# Patient Record
Sex: Female | Born: 1965 | Race: White | Hispanic: No | Marital: Married | Smoking: Never smoker
Health system: Southern US, Community
[De-identification: ages and names within clinical notes are randomized; demographics above are authoritative.]

## PROBLEM LIST (undated history)

## (undated) DIAGNOSIS — C519 Malignant neoplasm of vulva, unspecified: Secondary | ICD-10-CM

## (undated) DIAGNOSIS — Z8739 Personal history of other diseases of the musculoskeletal system and connective tissue: Secondary | ICD-10-CM

## (undated) DIAGNOSIS — Z940 Kidney transplant status: Secondary | ICD-10-CM

## (undated) DIAGNOSIS — T8859XA Other complications of anesthesia, initial encounter: Secondary | ICD-10-CM

## (undated) DIAGNOSIS — I82409 Acute embolism and thrombosis of unspecified deep veins of unspecified lower extremity: Secondary | ICD-10-CM

## (undated) DIAGNOSIS — T8612 Kidney transplant failure: Secondary | ICD-10-CM

## (undated) DIAGNOSIS — Z992 Dependence on renal dialysis: Secondary | ICD-10-CM

## (undated) HISTORY — PX: TOTAL ABDOMINAL HYSTERECTOMY: SHX209

## (undated) HISTORY — DX: Dependence on renal dialysis: Z99.2

## (undated) HISTORY — DX: Personal history of other diseases of the musculoskeletal system and connective tissue: Z87.39

## (undated) HISTORY — DX: Acute embolism and thrombosis of unspecified deep veins of unspecified lower extremity: I82.409

## (undated) HISTORY — PX: LEEP: SHX91

## (undated) HISTORY — DX: Malignant neoplasm of vulva, unspecified: C51.9

---

## 1999-04-04 HISTORY — PX: KIDNEY TRANSPLANT: SHX239

## 2012-04-03 HISTORY — PX: KIDNEY TRANSPLANT: SHX239

## 2012-11-22 DIAGNOSIS — D071 Carcinoma in situ of vulva: Secondary | ICD-10-CM | POA: Insufficient documentation

## 2012-11-22 DIAGNOSIS — Z940 Kidney transplant status: Secondary | ICD-10-CM

## 2012-11-22 DIAGNOSIS — N2581 Secondary hyperparathyroidism of renal origin: Secondary | ICD-10-CM | POA: Insufficient documentation

## 2012-11-22 DIAGNOSIS — I10 Essential (primary) hypertension: Secondary | ICD-10-CM | POA: Insufficient documentation

## 2012-11-26 ENCOUNTER — Ambulatory Visit: Payer: Self-pay

## 2012-11-26 ENCOUNTER — Ambulatory Visit: Payer: Self-pay | Admitting: Family Medicine

## 2012-11-26 LAB — CBC WITH DIFFERENTIAL/PLATELET
Basophil #: 0 10*3/uL (ref 0.0–0.1)
Basophil %: 0.3 %
Eosinophil #: 0.1 10*3/uL (ref 0.0–0.7)
Eosinophil %: 1 %
HCT: 31.4 % — ABNORMAL LOW (ref 35.0–47.0)
HGB: 10.5 g/dL — ABNORMAL LOW (ref 12.0–16.0)
Lymphocyte #: 2.4 10*3/uL (ref 1.0–3.6)
Lymphocyte %: 22.9 %
MCH: 29.7 pg (ref 26.0–34.0)
MCHC: 33.3 g/dL (ref 32.0–36.0)
MCV: 89 fL (ref 80–100)
Monocyte #: 0.7 x10 3/mm (ref 0.2–0.9)
Monocyte %: 6.4 %
Neutrophil #: 7.2 10*3/uL — ABNORMAL HIGH (ref 1.4–6.5)
Neutrophil %: 69.4 %
Platelet: 190 10*3/uL (ref 150–440)
RBC: 3.52 10*6/uL — ABNORMAL LOW (ref 3.80–5.20)
RDW: 13.2 % (ref 11.5–14.5)
WBC: 10.4 10*3/uL (ref 3.6–11.0)

## 2012-11-26 LAB — COMPREHENSIVE METABOLIC PANEL
Albumin: 4 g/dL (ref 3.4–5.0)
Alkaline Phosphatase: 82 U/L (ref 50–136)
Anion Gap: 12 (ref 7–16)
BUN: 33 mg/dL — ABNORMAL HIGH (ref 7–18)
Bilirubin,Total: 0.5 mg/dL (ref 0.2–1.0)
Calcium, Total: 9.7 mg/dL (ref 8.5–10.1)
Chloride: 105 mmol/L (ref 98–107)
Co2: 22 mmol/L (ref 21–32)
Creatinine: 2.86 mg/dL — ABNORMAL HIGH (ref 0.60–1.30)
EGFR (African American): 22 — ABNORMAL LOW
EGFR (Non-African Amer.): 19 — ABNORMAL LOW
Glucose: 88 mg/dL (ref 65–99)
Osmolality: 284 (ref 275–301)
Potassium: 4.6 mmol/L (ref 3.5–5.1)
SGOT(AST): 11 U/L — ABNORMAL LOW (ref 15–37)
SGPT (ALT): 19 U/L (ref 12–78)
Sodium: 139 mmol/L (ref 136–145)
Total Protein: 8.2 g/dL (ref 6.4–8.2)

## 2012-11-26 LAB — URIC ACID: Uric Acid: 10.4 mg/dL — ABNORMAL HIGH (ref 2.6–6.0)

## 2012-11-26 IMAGING — CR DG FOOT COMPLETE 3+V*L*
1 series · 4 of 4 positions shown · non-contrast
Comparison: none

REASON FOR EXAM: pain and swelling denies injury
COMMENTS:

PROCEDURE:     MDR - MDR FOOT LT COMP W/OBLQUES  - [DATE]  [DATE]
RESULT:     There is no evidence of fracture, dislocation, or malalignment.

[Series 1: ap · 0.17mm/px · 4 of 4 slices shown]
[im 1/4]
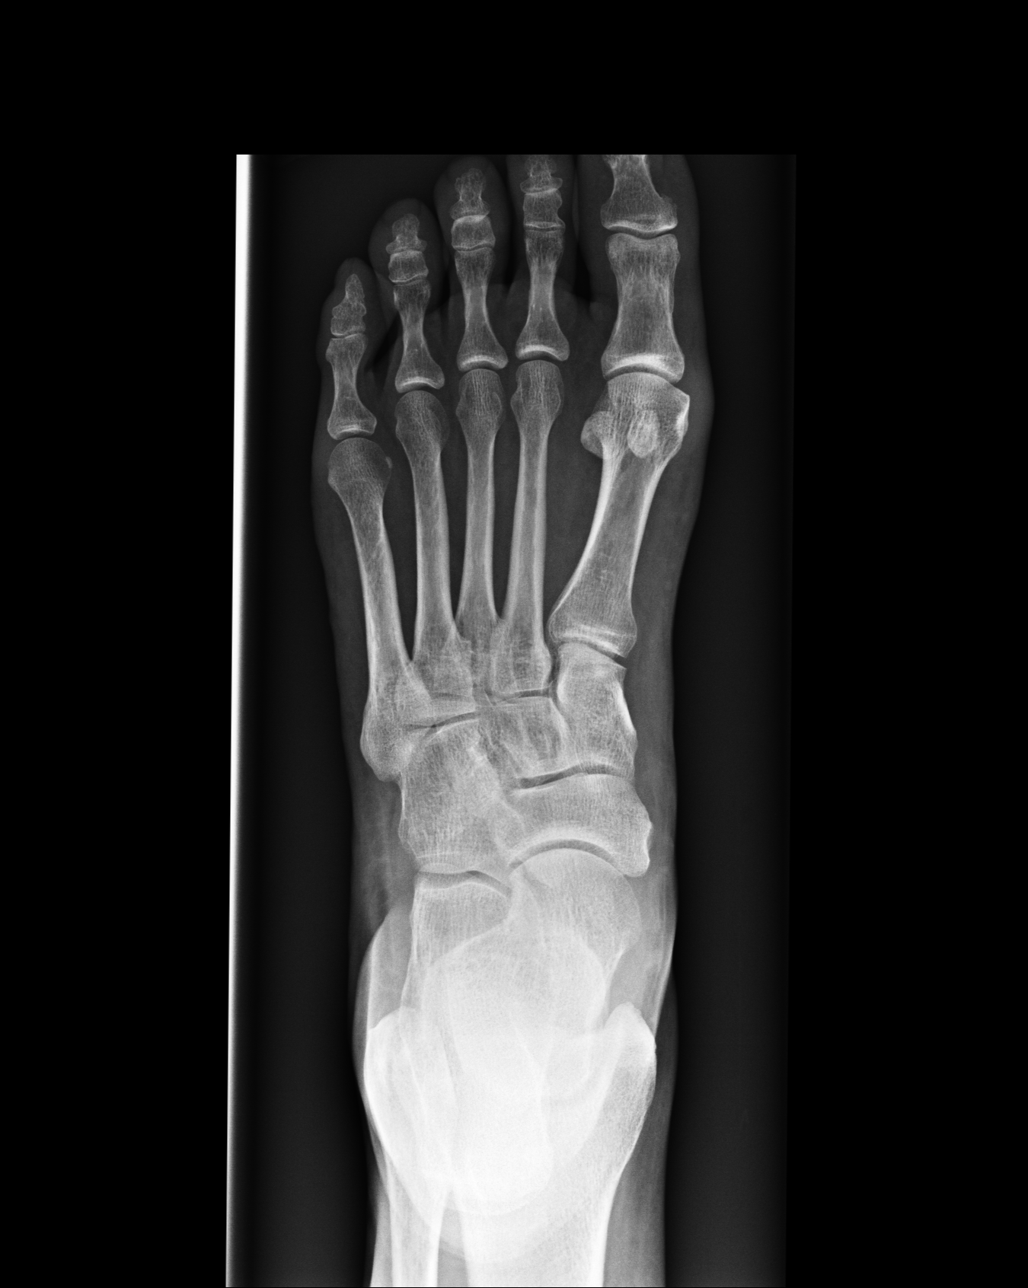
[im 2/4]
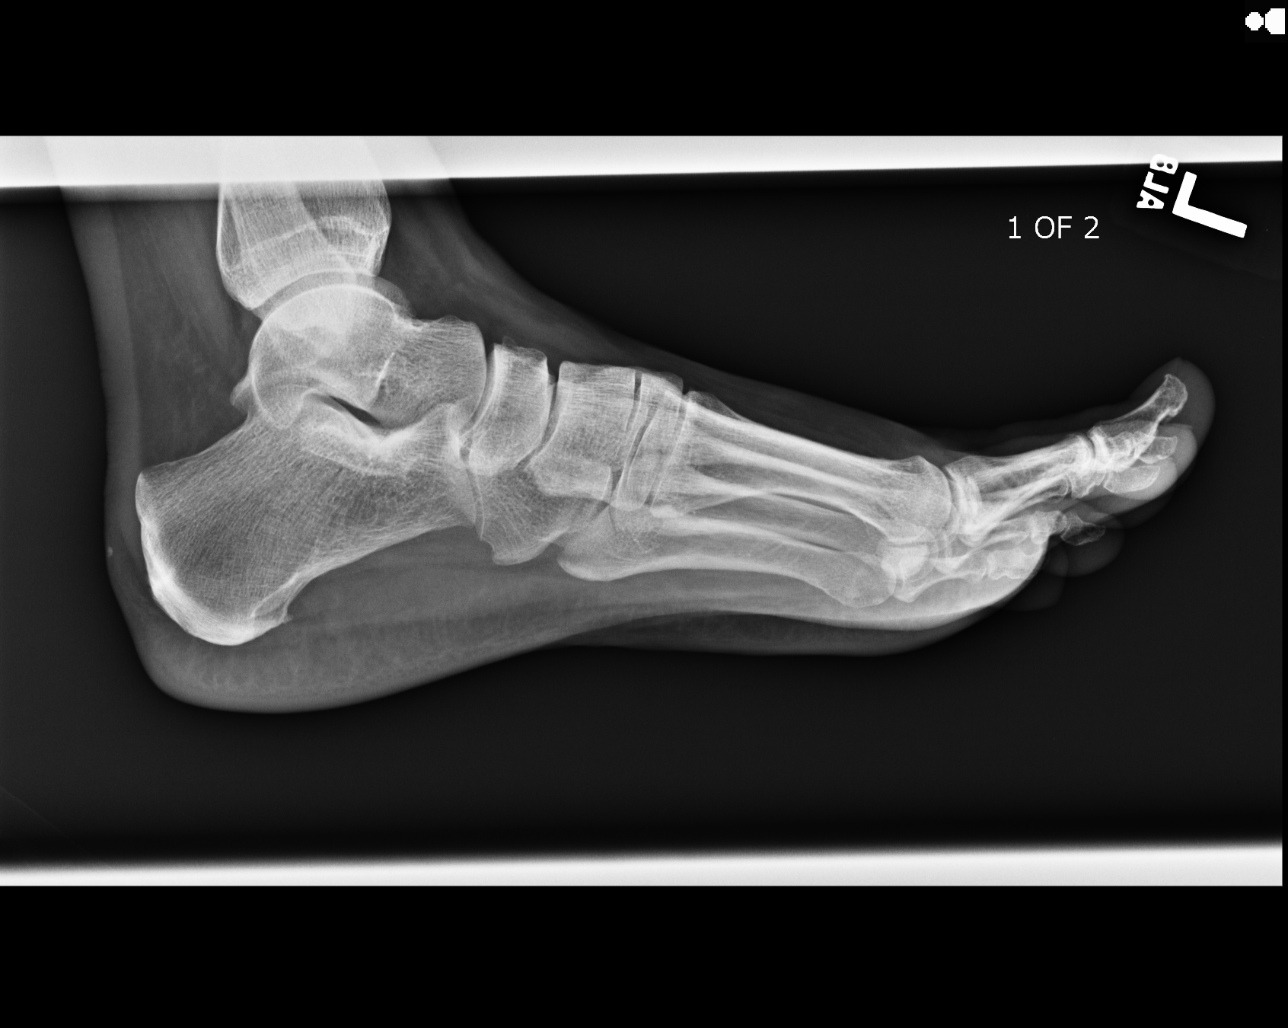
[im 3/4]
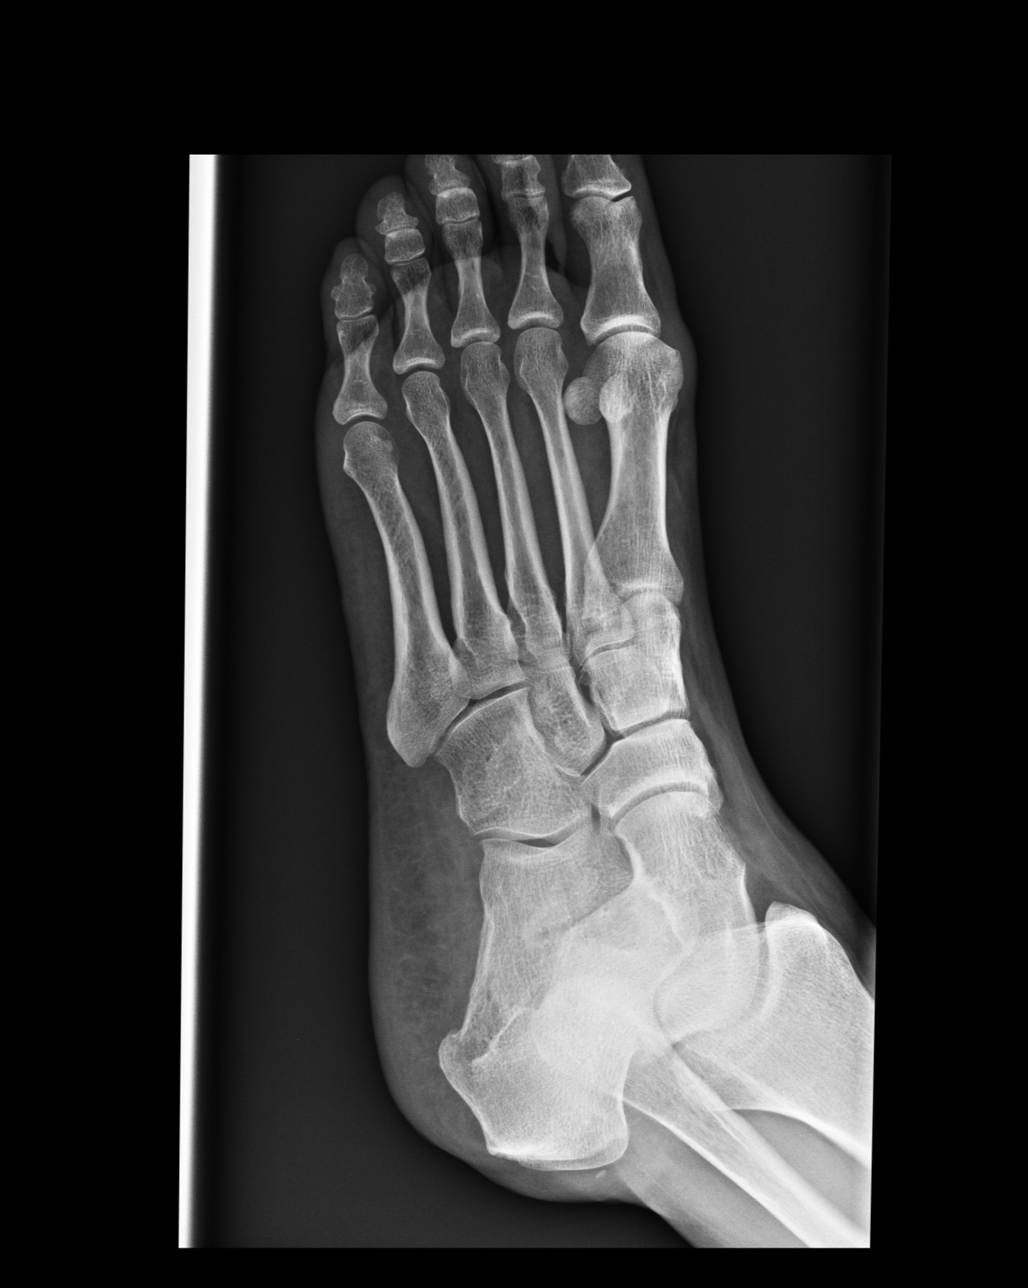
[im 4/4]
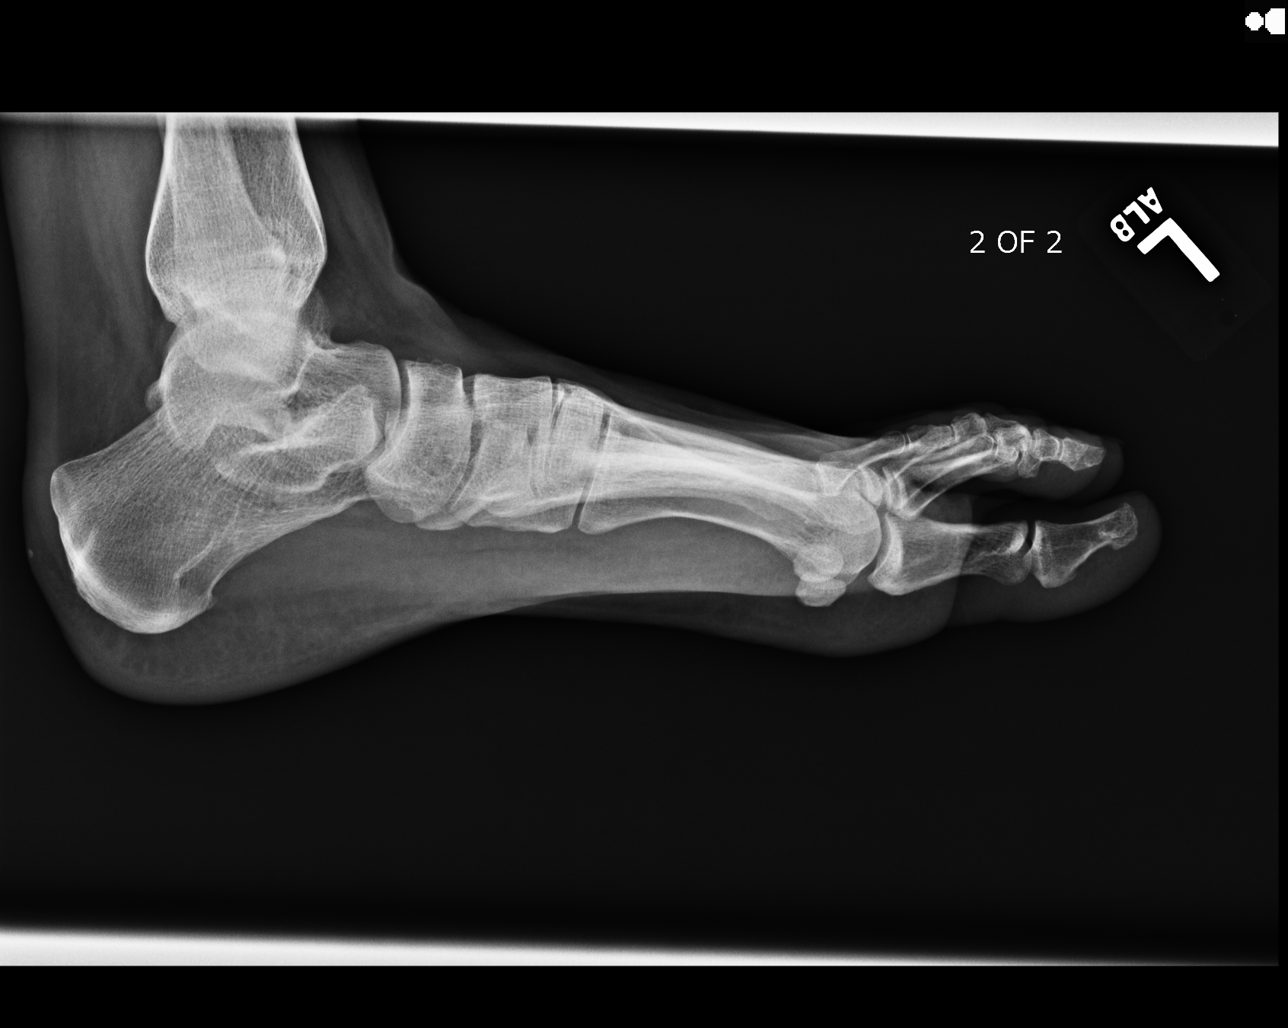

[4 of 4 positions shown; findings below may reference images not displayed]

IMPRESSION: 1. No evidence of acute abnormalities.
2. If there are persistent complaints of pain or persistent clinical
concern, a repeat evaluation in 7-10 days is recommended if clinically
warranted.

## 2013-02-24 DIAGNOSIS — G8918 Other acute postprocedural pain: Secondary | ICD-10-CM | POA: Insufficient documentation

## 2013-02-24 DIAGNOSIS — Z48298 Encounter for aftercare following other organ transplant: Secondary | ICD-10-CM | POA: Insufficient documentation

## 2013-02-24 DIAGNOSIS — D849 Immunodeficiency, unspecified: Secondary | ICD-10-CM | POA: Insufficient documentation

## 2013-02-24 DIAGNOSIS — T83122A Displacement of urinary stent, initial encounter: Secondary | ICD-10-CM | POA: Insufficient documentation

## 2013-02-24 DIAGNOSIS — R110 Nausea: Secondary | ICD-10-CM | POA: Insufficient documentation

## 2015-03-02 DIAGNOSIS — B259 Cytomegaloviral disease, unspecified: Secondary | ICD-10-CM | POA: Insufficient documentation

## 2016-11-22 DIAGNOSIS — S93491A Sprain of other ligament of right ankle, initial encounter: Secondary | ICD-10-CM | POA: Insufficient documentation

## 2017-08-29 ENCOUNTER — Encounter (HOSPITAL_COMMUNITY): Payer: Self-pay

## 2019-11-06 DIAGNOSIS — C519 Malignant neoplasm of vulva, unspecified: Secondary | ICD-10-CM | POA: Diagnosis present

## 2019-11-10 DIAGNOSIS — D509 Iron deficiency anemia, unspecified: Secondary | ICD-10-CM | POA: Insufficient documentation

## 2019-11-20 ENCOUNTER — Encounter (HOSPITAL_COMMUNITY): Payer: Self-pay

## 2019-12-05 DIAGNOSIS — I82419 Acute embolism and thrombosis of unspecified femoral vein: Secondary | ICD-10-CM | POA: Insufficient documentation

## 2020-04-04 DIAGNOSIS — L0390 Cellulitis, unspecified: Secondary | ICD-10-CM | POA: Insufficient documentation

## 2020-04-20 ENCOUNTER — Emergency Department (HOSPITAL_COMMUNITY)

## 2020-04-20 ENCOUNTER — Inpatient Hospital Stay (HOSPITAL_COMMUNITY)
Admission: EM | Admit: 2020-04-20 | Discharge: 2020-05-05 | DRG: 580 | Disposition: A | Discharge status: Rehabilitation Facility | Attending: Internal Medicine | Admitting: Internal Medicine

## 2020-04-20 ENCOUNTER — Other Ambulatory Visit: Payer: Self-pay

## 2020-04-20 DIAGNOSIS — Z7901 Long term (current) use of anticoagulants: Secondary | ICD-10-CM

## 2020-04-20 DIAGNOSIS — E875 Hyperkalemia: Secondary | ICD-10-CM | POA: Diagnosis present

## 2020-04-20 DIAGNOSIS — G8929 Other chronic pain: Secondary | ICD-10-CM | POA: Diagnosis not present

## 2020-04-20 DIAGNOSIS — M86151 Other acute osteomyelitis, right femur: Secondary | ICD-10-CM

## 2020-04-20 DIAGNOSIS — N764 Abscess of vulva: Secondary | ICD-10-CM | POA: Diagnosis present

## 2020-04-20 DIAGNOSIS — R64 Cachexia: Secondary | ICD-10-CM | POA: Diagnosis present

## 2020-04-20 DIAGNOSIS — E785 Hyperlipidemia, unspecified: Secondary | ICD-10-CM | POA: Diagnosis present

## 2020-04-20 DIAGNOSIS — E440 Moderate protein-calorie malnutrition: Secondary | ICD-10-CM | POA: Diagnosis present

## 2020-04-20 DIAGNOSIS — M84650A Pathological fracture in other disease, pelvis, initial encounter for fracture: Secondary | ICD-10-CM | POA: Diagnosis present

## 2020-04-20 DIAGNOSIS — N184 Chronic kidney disease, stage 4 (severe): Secondary | ICD-10-CM | POA: Diagnosis not present

## 2020-04-20 DIAGNOSIS — M8618 Other acute osteomyelitis, other site: Secondary | ICD-10-CM | POA: Diagnosis present

## 2020-04-20 DIAGNOSIS — C519 Malignant neoplasm of vulva, unspecified: Secondary | ICD-10-CM | POA: Diagnosis not present

## 2020-04-20 DIAGNOSIS — D84821 Immunodeficiency due to drugs: Secondary | ICD-10-CM | POA: Diagnosis not present

## 2020-04-20 DIAGNOSIS — E871 Hypo-osmolality and hyponatremia: Secondary | ICD-10-CM | POA: Diagnosis present

## 2020-04-20 DIAGNOSIS — Z940 Kidney transplant status: Secondary | ICD-10-CM

## 2020-04-20 DIAGNOSIS — R112 Nausea with vomiting, unspecified: Secondary | ICD-10-CM | POA: Diagnosis not present

## 2020-04-20 DIAGNOSIS — S31109A Unspecified open wound of abdominal wall, unspecified quadrant without penetration into peritoneal cavity, initial encounter: Secondary | ICD-10-CM | POA: Diagnosis not present

## 2020-04-20 DIAGNOSIS — B952 Enterococcus as the cause of diseases classified elsewhere: Secondary | ICD-10-CM | POA: Diagnosis present

## 2020-04-20 DIAGNOSIS — L0291 Cutaneous abscess, unspecified: Secondary | ICD-10-CM | POA: Diagnosis not present

## 2020-04-20 DIAGNOSIS — D649 Anemia, unspecified: Secondary | ICD-10-CM

## 2020-04-20 DIAGNOSIS — G894 Chronic pain syndrome: Secondary | ICD-10-CM | POA: Diagnosis not present

## 2020-04-20 DIAGNOSIS — E872 Acidosis: Secondary | ICD-10-CM | POA: Diagnosis present

## 2020-04-20 DIAGNOSIS — M86152 Other acute osteomyelitis, left femur: Secondary | ICD-10-CM

## 2020-04-20 DIAGNOSIS — Z1621 Resistance to vancomycin: Secondary | ICD-10-CM | POA: Diagnosis present

## 2020-04-20 DIAGNOSIS — R531 Weakness: Secondary | ICD-10-CM | POA: Diagnosis not present

## 2020-04-20 DIAGNOSIS — M6284 Sarcopenia: Secondary | ICD-10-CM | POA: Diagnosis present

## 2020-04-20 DIAGNOSIS — S31109S Unspecified open wound of abdominal wall, unspecified quadrant without penetration into peritoneal cavity, sequela: Secondary | ICD-10-CM | POA: Diagnosis not present

## 2020-04-20 DIAGNOSIS — Z86718 Personal history of other venous thrombosis and embolism: Secondary | ICD-10-CM | POA: Diagnosis not present

## 2020-04-20 DIAGNOSIS — R569 Unspecified convulsions: Secondary | ICD-10-CM

## 2020-04-20 DIAGNOSIS — N179 Acute kidney failure, unspecified: Secondary | ICD-10-CM | POA: Diagnosis not present

## 2020-04-20 DIAGNOSIS — Z923 Personal history of irradiation: Secondary | ICD-10-CM

## 2020-04-20 DIAGNOSIS — B3781 Candidal esophagitis: Secondary | ICD-10-CM | POA: Diagnosis present

## 2020-04-20 DIAGNOSIS — Z8249 Family history of ischemic heart disease and other diseases of the circulatory system: Secondary | ICD-10-CM

## 2020-04-20 DIAGNOSIS — N762 Acute vulvitis: Secondary | ICD-10-CM | POA: Diagnosis not present

## 2020-04-20 DIAGNOSIS — D631 Anemia in chronic kidney disease: Secondary | ICD-10-CM | POA: Diagnosis present

## 2020-04-20 DIAGNOSIS — R7881 Bacteremia: Secondary | ICD-10-CM | POA: Diagnosis not present

## 2020-04-20 DIAGNOSIS — Z8616 Personal history of COVID-19: Secondary | ICD-10-CM

## 2020-04-20 DIAGNOSIS — B9689 Other specified bacterial agents as the cause of diseases classified elsewhere: Secondary | ICD-10-CM | POA: Diagnosis not present

## 2020-04-20 DIAGNOSIS — B966 Bacteroides fragilis [B. fragilis] as the cause of diseases classified elsewhere: Secondary | ICD-10-CM | POA: Diagnosis not present

## 2020-04-20 DIAGNOSIS — K259 Gastric ulcer, unspecified as acute or chronic, without hemorrhage or perforation: Secondary | ICD-10-CM | POA: Diagnosis present

## 2020-04-20 DIAGNOSIS — L89152 Pressure ulcer of sacral region, stage 2: Secondary | ICD-10-CM | POA: Diagnosis not present

## 2020-04-20 DIAGNOSIS — R5381 Other malaise: Secondary | ICD-10-CM

## 2020-04-20 DIAGNOSIS — L02214 Cutaneous abscess of groin: Secondary | ICD-10-CM | POA: Diagnosis present

## 2020-04-20 DIAGNOSIS — M79662 Pain in left lower leg: Secondary | ICD-10-CM | POA: Diagnosis not present

## 2020-04-20 DIAGNOSIS — E7800 Pure hypercholesterolemia, unspecified: Secondary | ICD-10-CM | POA: Diagnosis present

## 2020-04-20 DIAGNOSIS — E43 Unspecified severe protein-calorie malnutrition: Secondary | ICD-10-CM | POA: Diagnosis not present

## 2020-04-20 DIAGNOSIS — R627 Adult failure to thrive: Secondary | ICD-10-CM | POA: Diagnosis present

## 2020-04-20 DIAGNOSIS — D630 Anemia in neoplastic disease: Secondary | ICD-10-CM | POA: Diagnosis present

## 2020-04-20 DIAGNOSIS — Z9221 Personal history of antineoplastic chemotherapy: Secondary | ICD-10-CM

## 2020-04-20 DIAGNOSIS — D6959 Other secondary thrombocytopenia: Secondary | ICD-10-CM | POA: Diagnosis not present

## 2020-04-20 DIAGNOSIS — R258 Other abnormal involuntary movements: Secondary | ICD-10-CM | POA: Diagnosis present

## 2020-04-20 DIAGNOSIS — R9431 Abnormal electrocardiogram [ECG] [EKG]: Secondary | ICD-10-CM | POA: Diagnosis present

## 2020-04-20 DIAGNOSIS — R040 Epistaxis: Secondary | ICD-10-CM | POA: Diagnosis present

## 2020-04-20 DIAGNOSIS — N766 Ulceration of vulva: Secondary | ICD-10-CM | POA: Diagnosis not present

## 2020-04-20 DIAGNOSIS — D696 Thrombocytopenia, unspecified: Secondary | ICD-10-CM | POA: Diagnosis present

## 2020-04-20 DIAGNOSIS — M6008 Infective myositis, other site: Secondary | ICD-10-CM | POA: Diagnosis present

## 2020-04-20 DIAGNOSIS — M25552 Pain in left hip: Secondary | ICD-10-CM | POA: Diagnosis not present

## 2020-04-20 DIAGNOSIS — Z681 Body mass index (BMI) 19 or less, adult: Secondary | ICD-10-CM | POA: Diagnosis not present

## 2020-04-20 DIAGNOSIS — Z20822 Contact with and (suspected) exposure to covid-19: Secondary | ICD-10-CM | POA: Diagnosis not present

## 2020-04-20 DIAGNOSIS — Z7952 Long term (current) use of systemic steroids: Secondary | ICD-10-CM

## 2020-04-20 DIAGNOSIS — F445 Conversion disorder with seizures or convulsions: Secondary | ICD-10-CM | POA: Diagnosis not present

## 2020-04-20 DIAGNOSIS — Z515 Encounter for palliative care: Secondary | ICD-10-CM | POA: Diagnosis not present

## 2020-04-20 DIAGNOSIS — D509 Iron deficiency anemia, unspecified: Secondary | ICD-10-CM | POA: Diagnosis present

## 2020-04-20 DIAGNOSIS — L8990 Pressure ulcer of unspecified site, unspecified stage: Secondary | ICD-10-CM | POA: Insufficient documentation

## 2020-04-20 DIAGNOSIS — Z7189 Other specified counseling: Secondary | ICD-10-CM | POA: Diagnosis not present

## 2020-04-20 DIAGNOSIS — L02211 Cutaneous abscess of abdominal wall: Principal | ICD-10-CM | POA: Diagnosis present

## 2020-04-20 DIAGNOSIS — K651 Peritoneal abscess: Secondary | ICD-10-CM | POA: Diagnosis not present

## 2020-04-20 DIAGNOSIS — T360X5A Adverse effect of penicillins, initial encounter: Secondary | ICD-10-CM | POA: Diagnosis not present

## 2020-04-20 DIAGNOSIS — M25551 Pain in right hip: Secondary | ICD-10-CM

## 2020-04-20 DIAGNOSIS — Z885 Allergy status to narcotic agent status: Secondary | ICD-10-CM

## 2020-04-20 DIAGNOSIS — D500 Iron deficiency anemia secondary to blood loss (chronic): Secondary | ICD-10-CM | POA: Diagnosis not present

## 2020-04-20 DIAGNOSIS — C775 Secondary and unspecified malignant neoplasm of intrapelvic lymph nodes: Secondary | ICD-10-CM | POA: Diagnosis present

## 2020-04-20 DIAGNOSIS — G893 Neoplasm related pain (acute) (chronic): Secondary | ICD-10-CM | POA: Diagnosis not present

## 2020-04-20 DIAGNOSIS — S32501A Unspecified fracture of right pubis, initial encounter for closed fracture: Secondary | ICD-10-CM | POA: Diagnosis not present

## 2020-04-20 DIAGNOSIS — Z79899 Other long term (current) drug therapy: Secondary | ICD-10-CM

## 2020-04-20 HISTORY — DX: Other complications of anesthesia, initial encounter: T88.59XA

## 2020-04-20 LAB — I-STAT BETA HCG BLOOD, ED (MC, WL, AP ONLY): I-stat hCG, quantitative: <5 m[IU]/mL (ref <5)

## 2020-04-20 LAB — CBC WITH DIFFERENTIAL/PLATELET
Abs Immature Granulocytes: 0.24 10*3/uL — ABNORMAL HIGH (ref 0.00–0.07)
Basophils Absolute: 0 10*3/uL (ref 0.0–0.1)
Basophils Relative: 0 %
Eosinophils Absolute: 0 10*3/uL (ref 0.0–0.5)
Eosinophils Relative: 0 %
HCT: 26.6 % — ABNORMAL LOW (ref 36.0–46.0)
Hemoglobin: 8.4 g/dL — ABNORMAL LOW (ref 12.0–15.0)
Immature Granulocytes: 3 %
Lymphocytes Relative: 11 %
Lymphs Abs: 1.1 10*3/uL (ref 0.7–4.0)
MCH: 27.5 pg (ref 26.0–34.0)
MCHC: 31.6 g/dL (ref 30.0–36.0)
MCV: 87.2 fL (ref 80.0–100.0)
Monocytes Absolute: 0.7 10*3/uL (ref 0.1–1.0)
Monocytes Relative: 8 %
Neutro Abs: 7.5 10*3/uL (ref 1.7–7.7)
Neutrophils Relative %: 78 %
Platelets: 102 10*3/uL — ABNORMAL LOW (ref 150–400)
RBC: 3.05 MIL/uL — ABNORMAL LOW (ref 3.87–5.11)
RDW: 15.8 % — ABNORMAL HIGH (ref 11.5–15.5)
WBC: 9.6 10*3/uL (ref 4.0–10.5)
nRBC: 0 % (ref 0.0–0.2)

## 2020-04-20 LAB — RESP PANEL BY RT-PCR (FLU A&B, COVID) ARPGX2
Influenza A by PCR: NEGATIVE
Influenza B by PCR: NEGATIVE
SARS Coronavirus 2 by RT PCR: NEGATIVE

## 2020-04-20 LAB — COMPREHENSIVE METABOLIC PANEL
ALT: 10 U/L (ref 0–44)
AST: 18 U/L (ref 15–41)
Albumin: 2.1 g/dL — ABNORMAL LOW (ref 3.5–5.0)
Alkaline Phosphatase: 110 U/L (ref 38–126)
Anion gap: 13 (ref 5–15)
BUN: 59 mg/dL — ABNORMAL HIGH (ref 6–20)
CO2: 21 mmol/L — ABNORMAL LOW (ref 22–32)
Calcium: 8 mg/dL — ABNORMAL LOW (ref 8.9–10.3)
Chloride: 99 mmol/L (ref 98–111)
Creatinine, Ser: 3.33 mg/dL — ABNORMAL HIGH (ref 0.44–1.00)
GFR, Estimated: 16 mL/min — ABNORMAL LOW (ref >60)
Glucose, Bld: 99 mg/dL (ref 70–99)
Potassium: 3.9 mmol/L (ref 3.5–5.1)
Sodium: 133 mmol/L — ABNORMAL LOW (ref 135–145)
Total Bilirubin: 1 mg/dL (ref 0.3–1.2)
Total Protein: 5.2 g/dL — ABNORMAL LOW (ref 6.5–8.1)

## 2020-04-20 IMAGING — DX DG CHEST 1V PORT
1 series · 1 of 1 positions shown · non-contrast
Comparison: Portable exam [JA] hours compared to [DATE]

CLINICAL DATA: Seizure

EXAM:
PORTABLE CHEST 1 VIEW

[chest ap]
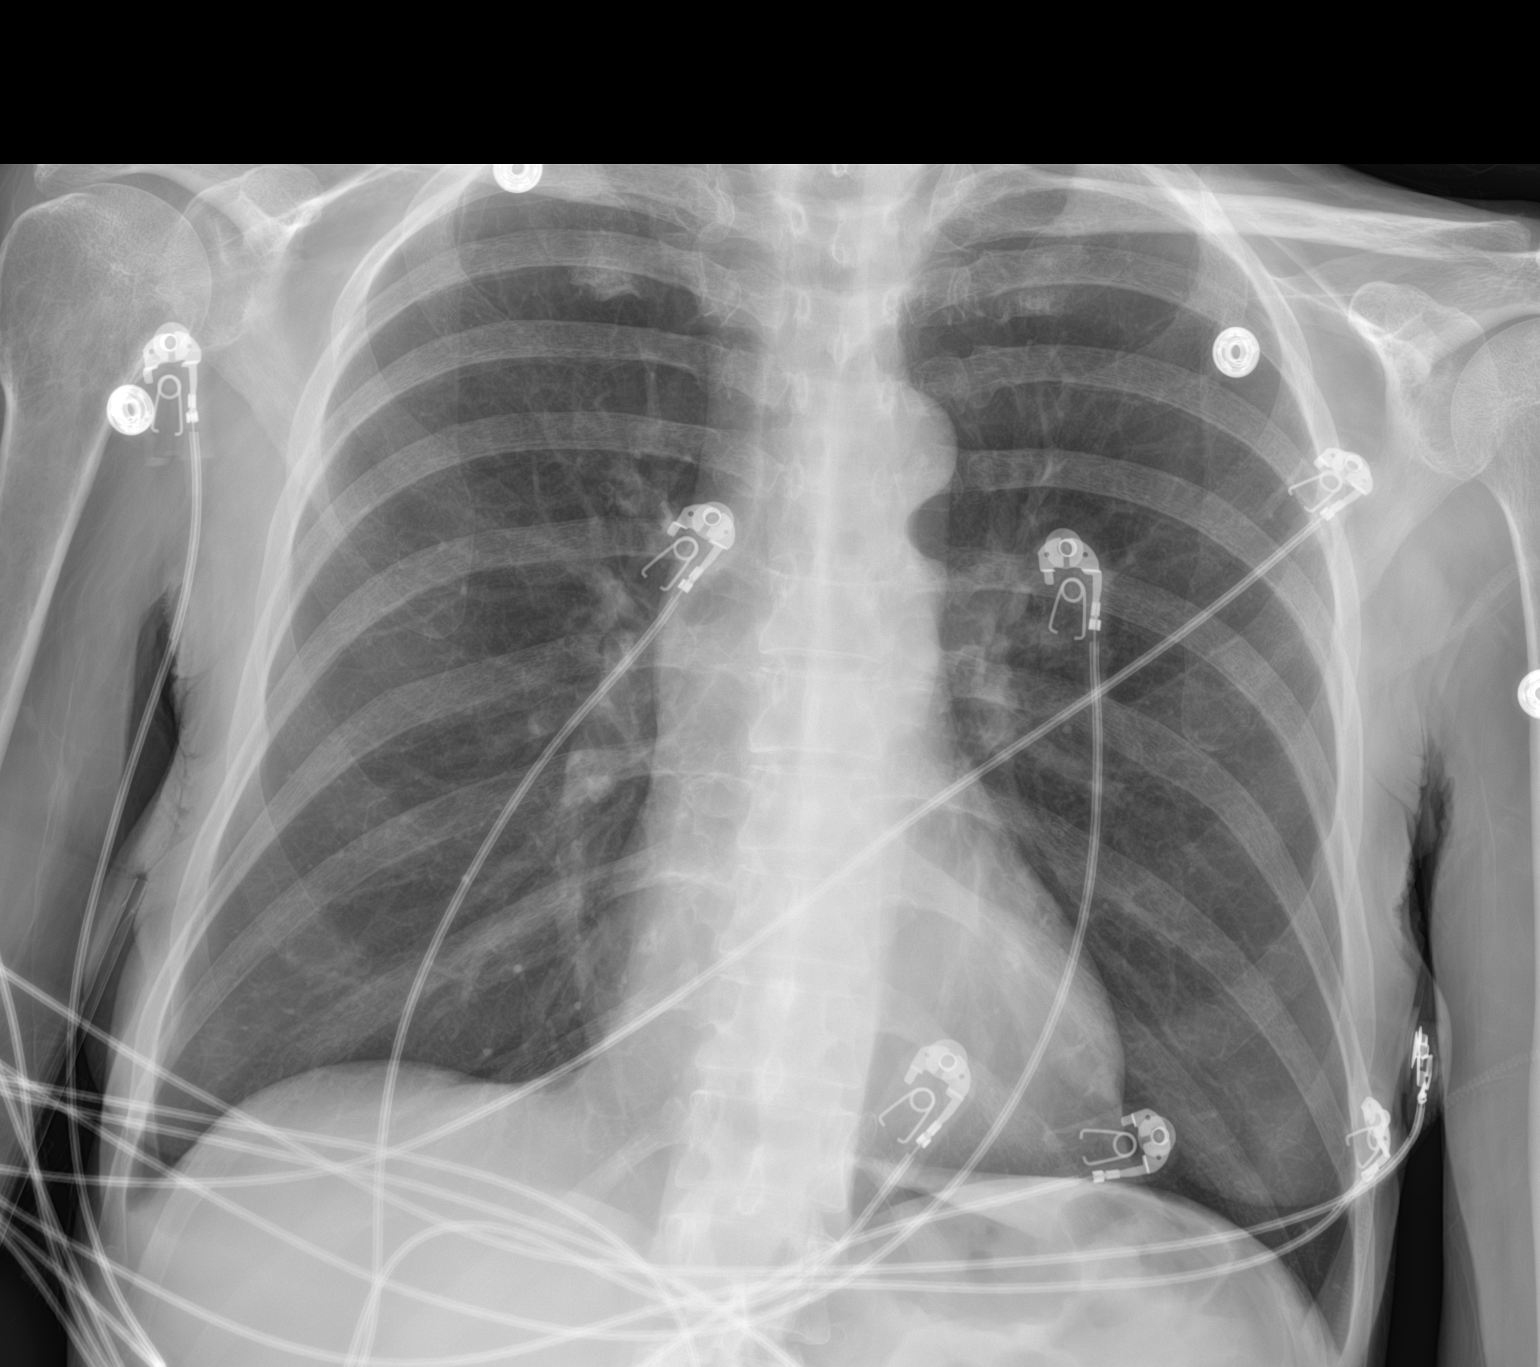

[1 of 1 positions shown; findings below may reference images not displayed]

FINDINGS: Normal heart size, mediastinal contours, and pulmonary vascularity.

Lungs mildly hyperinflated but clear.

No infiltrate, pleural effusion or pneumothorax.

Osseous structures unremarkable.
IMPRESSION: No acute abnormalities.

## 2020-04-20 IMAGING — CT CT HEAD W/O CM
4 series · 16 of 47 positions shown, 18 images · non-contrast
Comparison: No pertinent prior exams available for comparison.

CLINICAL DATA: Head trauma, altered mental status. Seizure,
syncope.

EXAM:
CT HEAD WITHOUT CONTRAST
TECHNIQUE: Contiguous axial images were obtained from the base of the skull
through the vertex without intravenous contrast.

[Series 3: head wo · axial · 0.44mm/px · z∈[+495,+615]mm · 7 of 33 slices shown, 9 images]
[im 5/33  brain]
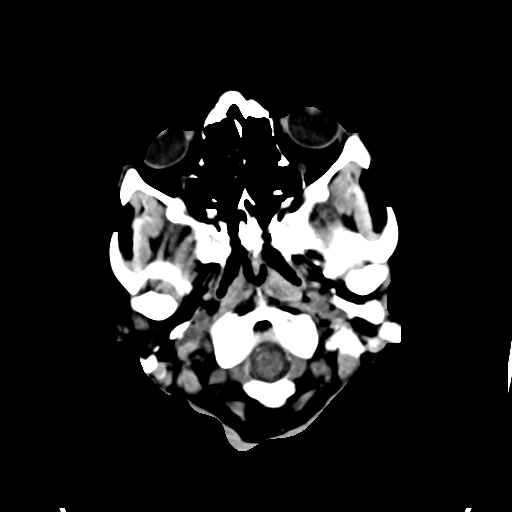
[im 5/33  bone]
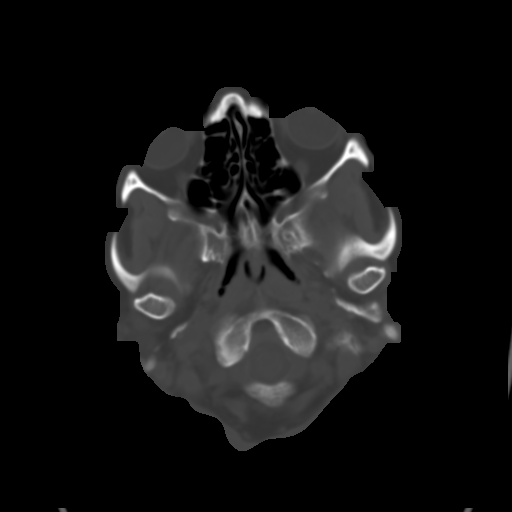
[im 9/33  brain]
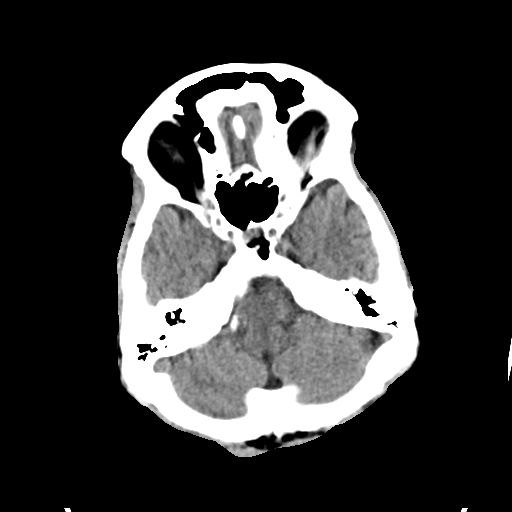
[im 13/33  brain]
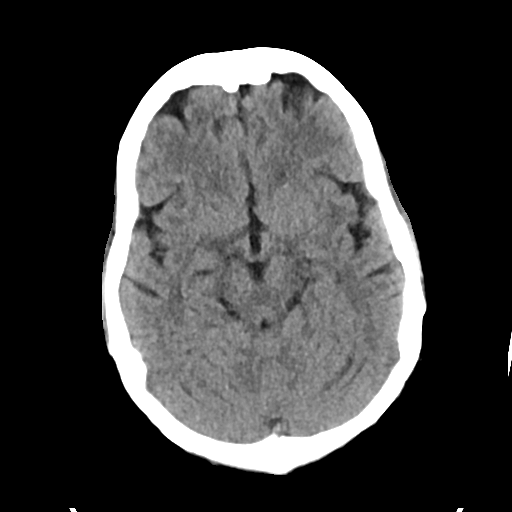
[im 17/33  brain]
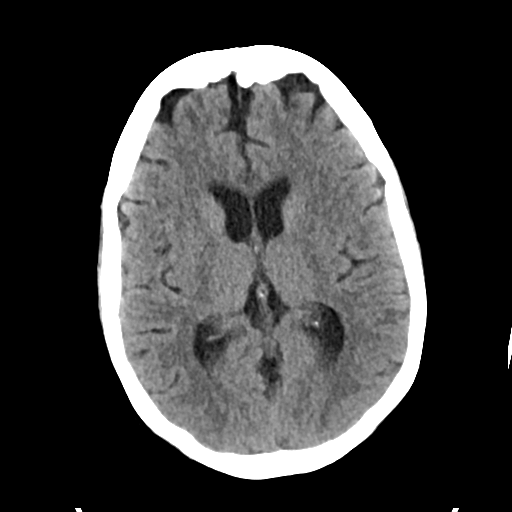
[im 21/33  brain]
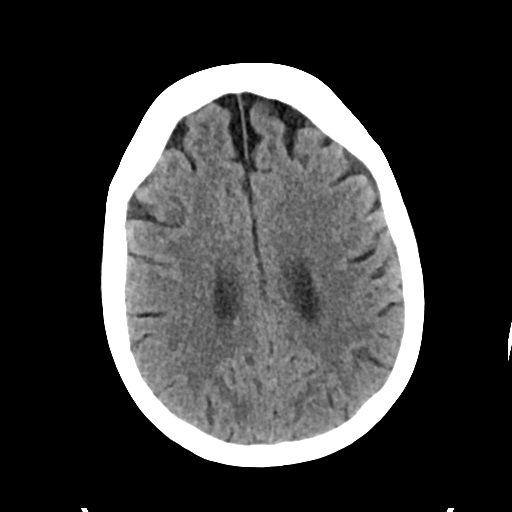
[im 21/33  bone]
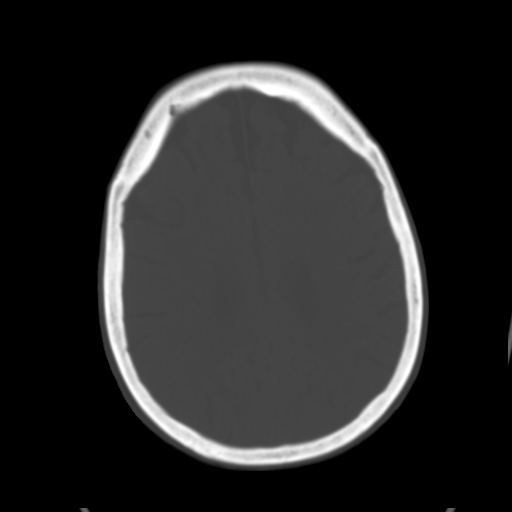
[im 25/33  brain]
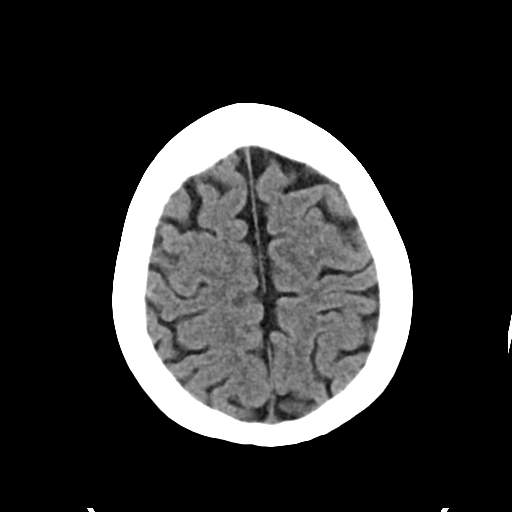
[im 29/33  brain]
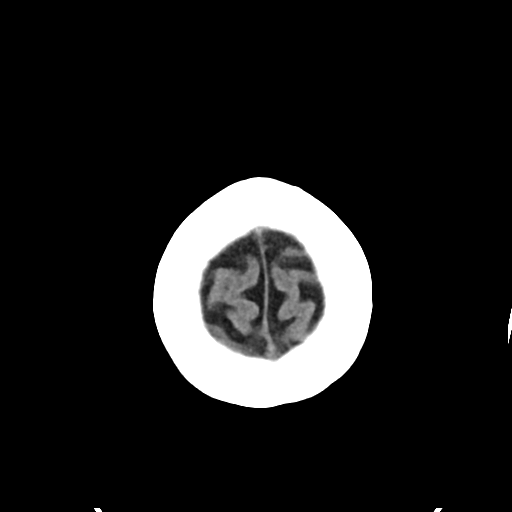

[Series 4: head bone · axial · 0.44mm/px · z∈[+491,+523]mm · 3 of 81 slices shown]
[im 9/81  bone]
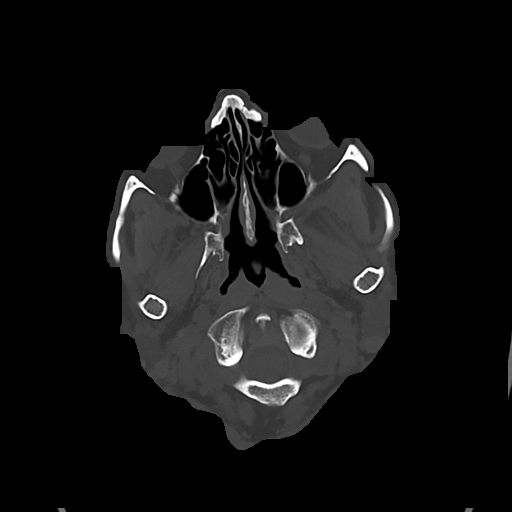
[im 17/81  bone]
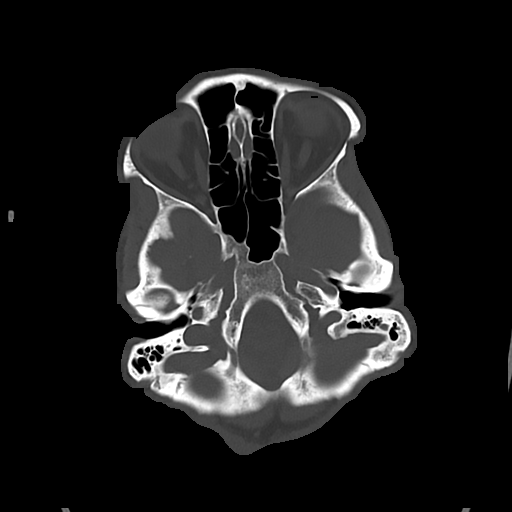
[im 25/81  bone]
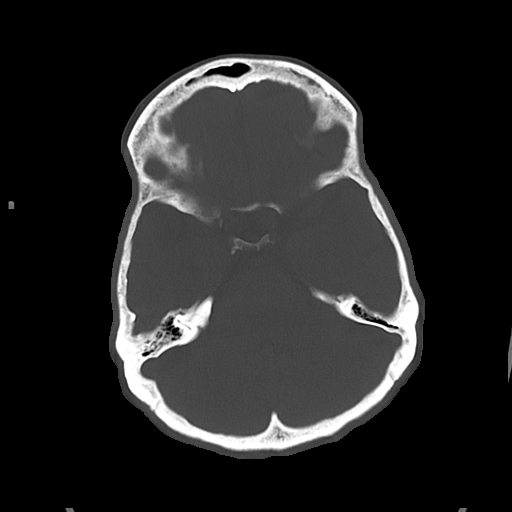

[Series 5: cor soft · coronal · 0.35mm/px · 3 of 74 slices shown]
[im 25/74  brain]
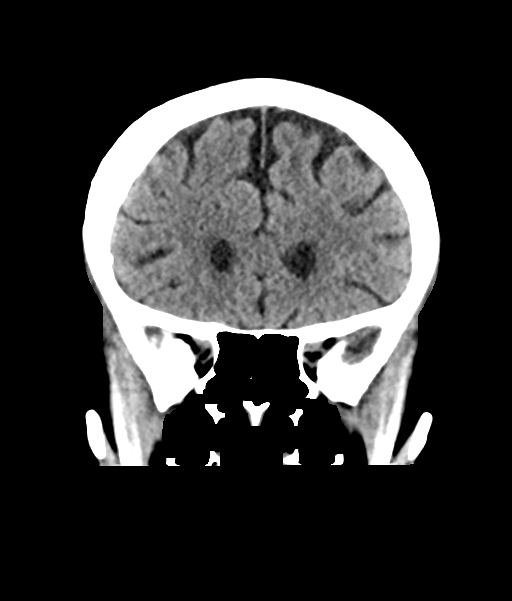
[im 33/74  brain]
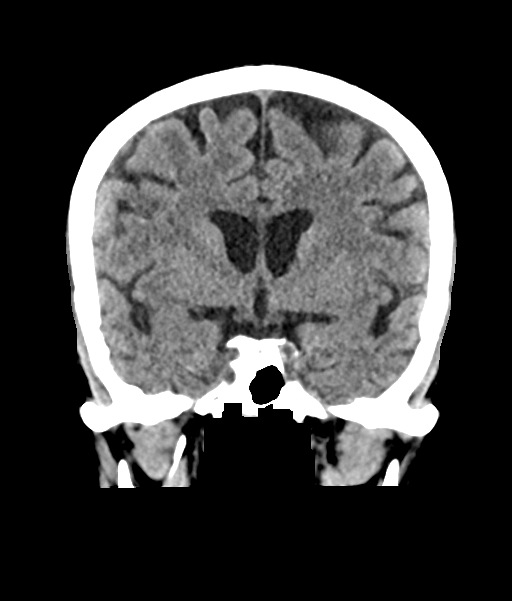
[im 41/74  brain]
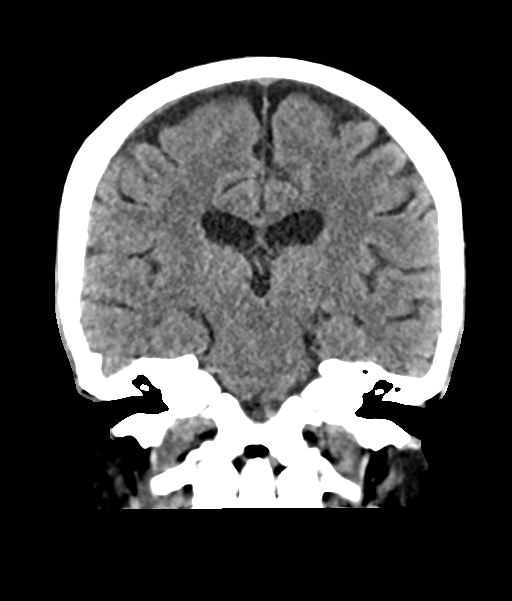

[Series 6: sag soft · sagittal · 0.37mm/px · 3 of 60 slices shown]
[im 20/60  brain]
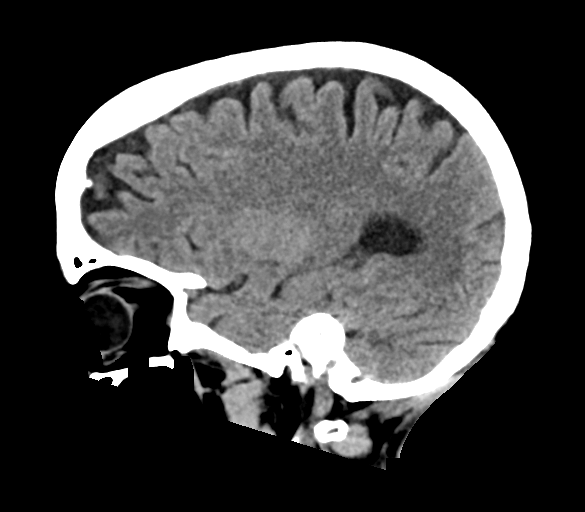
[im 30/60  brain]
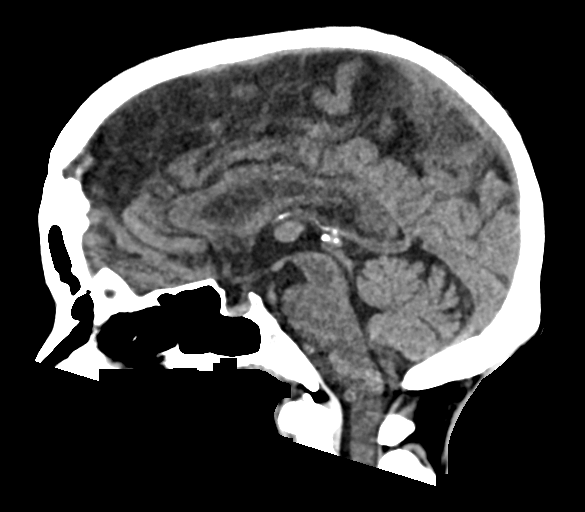
[im 40/60  brain]
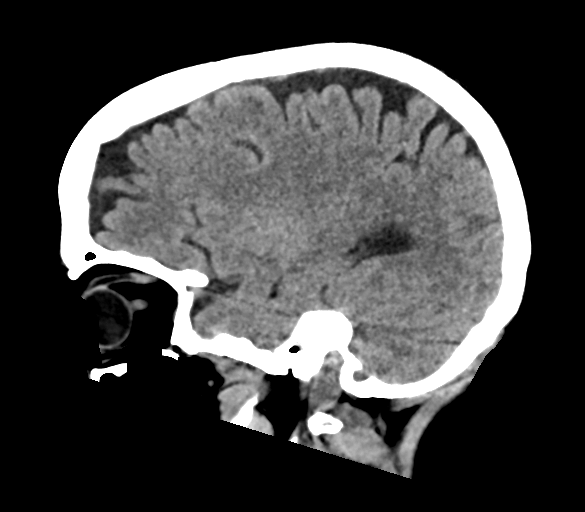

[16 of 47 positions shown; findings below may reference images not displayed]

FINDINGS: Brain:

Cerebral volume is normal for age.

Tiny chronic left thalamic lacunar infarct (series 3, image 16).

There is no acute intracranial hemorrhage.

No demarcated cortical infarct.

No extra-axial fluid collection.

No evidence of intracranial mass.

No midline shift.

Vascular: No hyperdense vessel.  Atherosclerotic calcifications

Skull: Normal. Negative for fracture or focal lesion.

Sinuses/Orbits: Visualized orbits show no acute finding. Minimal
scattered paranasal sinus mucosal thickening at the imaged levels.

Other: Trace fluid within the left mastoid air cells.
IMPRESSION: No evidence of acute intracranial abnormality.

Tiny chronic left thalamic lacunar infarct.

Minimal paranasal sinus mucosal thickening at the imaged levels.

Trace fluid within the left mastoid air cells.

## 2020-04-20 MED ORDER — LACTATED RINGERS IV BOLUS: 500.0000 mL | Freq: Once | INTRAVENOUS | Status: DC

## 2020-04-20 MED ORDER — LEVOFLOXACIN 500 MG PO TABS
500.0000 mg | ORAL_TABLET | ORAL | Status: AC
  Administered 2020-04-21 – 2020-04-23 (×2): 500 mg via ORAL
  Filled 2020-04-20 (×3): qty 1

## 2020-04-20 MED ORDER — METRONIDAZOLE 500 MG PO TABS
500.0000 mg | ORAL_TABLET | Freq: Three times a day (TID) | ORAL | Status: AC
  Administered 2020-04-20 – 2020-04-24 (×12): 500 mg via ORAL
  Filled 2020-04-20 (×12): qty 1

## 2020-04-20 MED ORDER — METHADONE HCL 5 MG PO TABS
2.5000 mg | ORAL_TABLET | Freq: Two times a day (BID) | ORAL | Status: DC
  Administered 2020-04-20 – 2020-05-05 (×30): 2.5 mg via ORAL
  Filled 2020-04-20 (×30): qty 1

## 2020-04-20 MED ORDER — TACROLIMUS 0.5 MG PO CAPS
0.5000 mg | ORAL_CAPSULE | Freq: Every day | ORAL | Status: DC
  Administered 2020-04-20 – 2020-04-22 (×3): 0.5 mg via ORAL
  Filled 2020-04-20 (×3): qty 1

## 2020-04-20 MED ORDER — HEPARIN SODIUM (PORCINE) 5000 UNIT/ML IJ SOLN
5000.0000 [IU] | Freq: Three times a day (TID) | INTRAMUSCULAR | Status: DC
  Filled 2020-04-20: qty 1

## 2020-04-20 MED ORDER — SODIUM CHLORIDE 0.9 % IV BOLUS
1000.0000 mL | Freq: Once | INTRAVENOUS | Status: AC
  Administered 2020-04-20: 1000 mL via INTRAVENOUS

## 2020-04-20 MED ORDER — ONDANSETRON HCL 4 MG/2ML IJ SOLN
4.0000 mg | Freq: Once | INTRAMUSCULAR | Status: DC
  Filled 2020-04-20: qty 2

## 2020-04-20 MED ORDER — PREDNISONE 5 MG PO TABS
5.0000 mg | ORAL_TABLET | Freq: Every day | ORAL | Status: DC
Start: 2020-04-21 — End: 2020-05-05
  Administered 2020-04-21 – 2020-05-05 (×14): 5 mg via ORAL
  Filled 2020-04-20 (×15): qty 1

## 2020-04-20 NOTE — H&P (Addendum)
Date: 04/20/2020               Patient Name:  Christina Mack MRN: [id number]  DOB: [birth year] Age / Sex: 55 y.o., female   PCP: Bonnita Nasuti, MD         Medical Service: Internal Medicine Teaching Service         Attending Physician: Dr. Lucious Groves, DO    First Contact: Dr. Johnney Ou Pager: [phone]  Second Contact: Dr. 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 Her symptoms started 04/04/20, at this she presented to the [facility] ED and was admitted after having progressive weakness and decline in her overall functional status. At this time she also developed pain over a large deep wound of her right labia and inner thigh. She was found to have a WBC of 19, Lactacte of 1.6. Imaging was performed which revealed large right perineal mass with new extension into surrounding soft tissue with stranding and gas. No abscess collection noted. She was evaluated by gen surg and gyn/oncology who recommended IV abx alone. There was initial concern for necrotizing fasciitis, however, both consults did not believe her wound was consistent with this. ID was also consulted, who recommended oral levofloxacin and metronidazole with an abx end date of 04/24/20.   During this admission, she was also evaluated for her vulvar cancer with which gyn onc recommended outpatient PET. Her hospital stay was also complicated by AKI on CKD Stage 5. Nephrology was consulted, who recommended holding Cellcept and reducing dose of Prograf. Re-initiation of HD was contemplated but ultimatley held off. Stay was also complicated by  acute on chronic anemia requiring pRBC transfusion, diarrhea negative for C.diff and mild stable hyponatremia. Of note, she endorses that zofran during her admission helped with her nausea/vomiting.  Since being discharged, Christina Mack notes she has been unable to eat or drink without vomiting, and is so weak that she can no longer stand. She has been wanting to follow up with her nephrologist at [facility] and did not have an appointment until later this month. Because of this, she planned to drive to the ED at [facility] today. However, prior to this occurring she felt too weak and EMS was called. Once they arrive and placed the patient in a stretcher, they asker her to put her arms in the stretcher as to make it down the hallway. The patient was unable to control her arms, noting that they were flailing and shaking. Throughout this episode, the patient was alert and able to exclaim that she was not able to control her arms. At this point, this was the last thing the patient remembers. The next thing she remembers is being at the hospital with her daughter at bedside. Per the patient's daughter, the patient did not know where she was, her cancer diagnosis, or other facts she typically knows at baseline. Patient denies history of seizures and denies similar episode in the past. Family also note a recent fall where patient hit her head, patient is anticoagulated and she was not evaluated for  this.  She does note that her lower extremity weakness has improved since her prior admission. Denies fever, lightheadedness, dizziness, chills, chest pain, back pain, n/v/d, or difficulty passing her bowels. She does still make urine.   ED Course:  On arrival to the ED, patient was noted to be tachycardic to 137, BP of 155/97, with respiratory rate of 29 with oxygen saturations of 100%. Initial lab work remarkable for anemia of 8.4 with platelets of 102; no leukocytosis. CMP remarkable for elevated BUN of 59 and creatinine of 3.33. CT  head negative for acute intracranial abnormalities.   Meds:  Current Meds  Medication Sig  . apixaban (ELIQUIS) 2.5 MG TABS tablet Take 2.5 mg by mouth 2 (two) times daily.  . ferrous sulfate 325 (65 FE) MG tablet Take 325 mg by mouth 2 (two) times daily with a meal.  . levofloxacin (LEVAQUIN) 500 MG tablet Take 500 mg by mouth every other day. For 8 days  . loperamide (IMODIUM) 2 MG capsule Take 2 mg by mouth 4 (four) times daily as needed for diarrhea or loose stools.  . magnesium oxide (MAG-OX) 400 MG tablet Take 400 mg by mouth 2 (two) times daily.  . methadone (DOLOPHINE) 5 MG tablet Take 2.5 mg by mouth every 12 (twelve) hours.  . metroNIDAZOLE (FLAGYL) 500 MG tablet Take 500 mg by mouth 3 (three) times daily. For 7 days  . ondansetron (ZOFRAN) 8 MG tablet Take 8 mg by mouth every 8 (eight) hours as needed for nausea or vomiting.  . predniSONE (DELTASONE) 5 MG tablet Take 5 mg by mouth daily with breakfast.  . tacrolimus (PROGRAF) 0.5 MG capsule Take 0.5 mg by mouth daily.  . valACYclovir (VALTREX) 500 MG tablet Take 500 mg by mouth 2 (two) times daily.   Allergies: Allergies as of 04/20/2020 - Review Complete 04/20/2020  Allergen Reaction Noted  . Codeine  04/20/2020  . Hydromorphone Nausea And Vomiting 11/22/2016  . Oxycodone-acetaminophen Itching, Nausea Only, Other (See Comments), and Nausea And Vomiting 04/20/2020   Past Medical History:  Hypercholesteremia  Kidney transplant (2001, 2014) (Last radiation 01/2020) Previous CMV infection   Family History:  CV: Aneurysm (mother), HTN (Brother)  DM: None Cancer: None   Social History:  Lives at home Tobacco: None ETOH: occasional glass of wine Drugs: None  Review of Systems: A complete ROS was negative except as per HPI.   Physical Exam: Blood pressure 127/80, pulse 100, temperature 98.7 F (37.1 C), temperature source Oral, resp. rate 20, height 5\' 5"  (1.651 m), weight 51.6 kg, SpO2 100 %.   Physical  Exam Vitals and nursing note reviewed.  Constitutional:      Appearance: She is ill-appearing.     Comments: Thin, frail appealing  HENT:     Head: Normocephalic and atraumatic.  Eyes:     Pupils: Pupils are equal, round, and reactive to light.  Cardiovascular:     Rate and Rhythm: Normal rate and regular rhythm.     Pulses: Normal pulses.     Heart sounds: Normal heart sounds. No murmur heard. No gallop.   Pulmonary:     Effort: Pulmonary effort is normal. No respiratory distress.     Breath sounds: Normal breath sounds. No stridor. No wheezing or rhonchi.  Abdominal:     General: Abdomen is flat.     Tenderness: There is no abdominal tenderness. There is no guarding or rebound.  Musculoskeletal:     Right lower leg: No edema.  Left lower leg: No edema.  Skin:    General: Skin is warm and dry.     Findings: Bruising (diffuse bruises on extremities) present.  Neurological:     Mental Status: She is oriented to person, place, and time. Mental status is at baseline.     Cranial Nerves: No cranial nerve deficit.     Motor: Weakness present.     Comments: Bilateral lower extremity weakness 4/5. Upper extremity 5/5 strength. Asterixis negative  Psychiatric:        Mood and Affect: Mood normal.        Behavior: Behavior normal.    After getting a female chaperone patient on bedpan and deferred examination of right thigh wound until tomorrow morning.  EKG: personally reviewed my interpretation is sinus tachycardia, QTc prolongation. No prior for comparison.   CXR: personally reviewed my interpretation is no acute abnormality  Head CT w/o C No evidence of acute intracranial abnormality. Tiny chronic left thalamic lacunar infarct. Minimal paranasal sinus mucosal thickening at the imaged levels. Trace fluid within the left mastoid air cells.  CT A/P from [facility] 04/04/20 CT abdomen pelvis redemonstrates large right perineal mass with new extensive surrounding soft tissue  stranding and gas which extends along the bilateral anterior pubic bones and anterior pelvis, I am unable to exclude developing necrotizing fasciitis, no definite drainable abscess collection is an identified, probable mild chronic osteomyelitis of the right inferior pubic ramus, diffuse edema of the pelvic mesentery with mild presacral fluid concerning for mesenteritis, no definite drainable intraperitoneal fluid collection identified, suspected bowel ileus or gastroenteritis, moderately distended gallbladder sludge versus gallstones, hepatosplenomegaly, moderate diffuse urinary bladder wall thickening suggesting chronic cystitis.   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   Generalized Weakness Multifactorial with deconditioning given recent ~ 2 week hospital admission and lack of PO intake secondary to her nausea and vomiting. Patient also endorsed decrease in mobility secondary to her pain from her right thigh wound. Unlikely due to acute infection given lack of leukocytosis and patient is currently on antibiotics. She does endorse improvement since prior hospitalization. Low suspicion for GBS, however, patient with episodes of diarrhea during prior hospitalization. Head CT with tiny chronic left thalamic lacunar infarct, but nothing acute or metastatic seen.  -daily BMP -PT/OT evaluation  -B12, TSH, HIV pending -consider opportunistic infections in setting of malignancy and hx of renal transplant  Seizure-like Activity  Patient's description of event more consistent with non-epileptic seizure activity. CT head without evidence of acute intracranial abnormalities. Electrolytes are stable, without  hyponatremia or hypoglycemia. Asterixis negative upon examination. Patient without fever, leukocytosis and is hemodynamically stable, low suspicion for acute infection at this time. This may be in the setting of acute on chronic uremia/metabolic abnormalities. Will also discuss with patient tomorrow if she was unable to take her medications in setting of nausea/vomiting, possible medication withdrawal.  -neurology consulted by EDP; will follow up recommendations  -will hold off on EEG for now, patient at baseline per daughter  -daily BMP -CK pending  Nausea/Vomiting Possibly due to patient's recent infection. Low suspicion for bowel obstruction given patient is having bowel movements and abdominal exam is reassuring. Zofran reduced episodes  during prior admission however she has prolonged QTC on admission. Suspect this is secondary to anti-emetics use. -continue to monitor -renal diet -if becomes nauseated will need to use non QTc prolonging medications until repeat EKG   Right Labial Soft Tissue Infection Patient found to have infection of right labial mass, not consistent with necrotizing fasciitis at that time per documentation. Patient has been on levofloxacin and metronidazole with 3 week course, end date of 04/24/20. Patient notes improvement of pain. Plan to evaluate tomorrow am -evaluate tomorrow am -continue abx of levofloxacin and metronidazole  Kidney Failure s/p kidney transplant 2001 and 3570 Patient uncertain of kidney failure etiology. Follow by [facility] and [facility]. Initial transplant failed, patient placed on temporary dialysis at one time. Second transplant in 2014. Prior documentation notes they do not think recent transplant is salvageable. Do not believe patient requires dialysis at this time.   -consider nephrology consult  -resume prograg .5 mg -cellcept on hold in setting of recurrent infection  Stage 3C Vulvar Cancer Patient completed  treatment at this time. Follows with gyn/onc at [facility]. Evaluated by them at prior admission who recommended  -follow up with gyn/onc on outpatient basis  Extremity Bruises  Patient notes bruises appeared after one of the antibiotics given caused her platelets to drop during prior hospitalization. Patient initially prescribed vanc and zosyn at that time and both were discontinued -continue to monitor -daily cbc, platelets of 100.   Hx of DVT -Will restart home eliquis of 2.5 mg BID  Diet: Renal DVT Prophylaxis: Heparin Code Status: Full Code  Dispo: Admit patient to Inpatient with expected length of stay greater than 2 midnights.  [place]  Internal Medicine Resident PGY-1 [place]  Pager: [phone]

## 2020-04-20 NOTE — ED Notes (Signed)
RN notified about vitals 

## 2020-04-20 NOTE — ED Triage Notes (Addendum)
BIB EMS for weakness and vomiting. Family requested pt to go to [facility] but pt had a 30-40s seizure with EMS, 10 mins post ictal.  She is a pt of [facility] for Kidney Transplant but possibly going into renal failure. Recently seen at [facility] for sepsis and recently discharged Saturday. Daughter reported that pt fell Sunday, hit her head but "she was ok". On blood thinner for blood clot in right leg.

## 2020-04-20 NOTE — ED Notes (Signed)
Attempted x 1  

## 2020-04-20 NOTE — ED Provider Notes (Signed)
[facility] EMERGENCY DEPARTMENT Provider Note   CSN: [id number] Arrival date & time: 04/20/20  1130     History Chief Complaint  Patient presents with  . Seizures    Christina Mack is a 56 y.o. female.  HPI   55 year old female with past medical history of kidney transplant x 2 and renal failure presents to the emergency department for weakness, vomiting and a reported seizure.  Patient was originally trying to go to [facility] to see her transplant doctor to be evaluated for the weakness and vomiting.  She was too weak to go so the husband called EMS, the patient says as he EMS crew was trying to get her down the hallway they were telling her to put her arms in and she was unable to bear hug herself.  She says that her arms were flailing and shaking.  EMS told the husband that it looked like she was having a seizure however the patient was sitting up and alert throughout this.  Patient does have amnesia of her ambulance ride.  Patient states she has never had seizures before.  Apparently a couple weeks ago patient was admitted for sepsis, they report it was a UTI and is currently completing outpatient oral regimen of antibiotics.  Since being home for the past 3 days she has been having vomiting.  No diarrhea.  Denies any fever or chills.  Of note the family member at bedside mentioned a fall with head injury, patient is anticoagulated, patient was not evaluated after this fall.  No past medical history on file.  There are no problems to display for this patient.     OB History   No obstetric history on file.     No family history on file.     Home Medications Prior to Admission medications   Medication Sig Start Date End Date Taking? Authorizing Provider  apixaban (ELIQUIS) 2.5 MG TABS tablet Take 2.5 mg by mouth 2 (two) times daily.   Yes [provider]  ferrous sulfate 325 (65 FE) MG tablet Take 325 mg by mouth 2 (two) times daily with a meal.   Yes  [provider]  levofloxacin (LEVAQUIN) 500 MG tablet Take 500 mg by mouth every other day. For 8 days   Yes [provider]  loperamide (IMODIUM) 2 MG capsule Take 2 mg by mouth 4 (four) times daily as needed for diarrhea or loose stools.   Yes [provider]  magnesium oxide (MAG-OX) 400 MG tablet Take 400 mg by mouth 2 (two) times daily.   Yes [provider]  methadone (DOLOPHINE) 5 MG tablet Take 2.5 mg by mouth every 12 (twelve) hours.   Yes [provider]  metroNIDAZOLE (FLAGYL) 500 MG tablet Take 500 mg by mouth 3 (three) times daily. For 7 days   Yes [provider]  ondansetron (ZOFRAN) 8 MG tablet Take 8 mg by mouth every 8 (eight) hours as needed for nausea or vomiting.   Yes [provider]  predniSONE (DELTASONE) 5 MG tablet Take 5 mg by mouth daily with breakfast.   Yes [provider]  tacrolimus (PROGRAF) 0.5 MG capsule Take 0.5 mg by mouth daily.   Yes [provider]  valACYclovir (VALTREX) 500 MG tablet Take 500 mg by mouth 2 (two) times daily.   Yes [provider]    Allergies    Codeine, Hydromorphone, and Oxycodone-acetaminophen  Review of Systems   Review of Systems  Reason  unable to perform ROS: limited 2/2 amnesia.  Constitutional: Positive for fatigue.  Gastrointestinal: Positive for vomiting.    Physical Exam Updated Vital Signs BP (!) 133/97   Pulse (!) 109   Temp 98.2 F (36.8 C) (Oral)   Resp 16   Ht 5\' 5"  (1.651 m)   Wt 54 kg   SpO2 100%   BMI 19.80 kg/m   Physical Exam Vitals and nursing note reviewed.  Constitutional:      Comments: Cachectic and ill-appearing, no acute distress  HENT:     Head: Normocephalic.     Mouth/Throat:     Mouth: Mucous membranes are dry.  Eyes:     Extraocular Movements: Extraocular movements intact.     Pupils: Pupils are equal, round, and reactive to light.  Cardiovascular:     Rate and Rhythm: Normal rate.   Pulmonary:     Effort: Pulmonary effort is normal. No respiratory distress.  Abdominal:     Palpations: Abdomen is soft.     Tenderness: There is no abdominal tenderness.  Musculoskeletal:        General: No swelling or deformity.     Cervical back: No rigidity.  Skin:    General: Skin is warm.  Neurological:     Mental Status: She is alert.     Comments: Only oriented to self     ED Results / Procedures / Treatments   Labs (all labs ordered are listed, but only abnormal results are displayed) Labs Reviewed  CBC WITH DIFFERENTIAL/PLATELET - Abnormal; Notable for the following components:      Result Value   RBC 3.05 (*)    Hemoglobin 8.4 (*)    HCT 26.6 (*)    RDW 15.8 (*)    All other components within normal limits  COMPREHENSIVE METABOLIC PANEL  URINALYSIS, ROUTINE W REFLEX MICROSCOPIC  I-STAT BETA HCG BLOOD, ED ([facility], [facility], [facility] ONLY)    EKG EKG Interpretation  Date/Time:  Tuesday April 20 2020 11:35:28 EST Ventricular Rate:  140 PR Interval:    QRS Duration: 75 QT Interval:  351 QTC Calculation: 536 R Axis:   54 Text Interpretation: Sinus tachycardia Probable left atrial enlargement Nonspecific repol abnormality, diffuse leads Prolonged QT interval sinus tachycardia, no STEMI Confirmed by Lavenia Atlas [phone]) on 04/20/2020 12:13:15 PM   Radiology DG Chest Port 1 View  Result Date: 04/20/2020 CLINICAL DATA:  Seizure EXAM: PORTABLE CHEST 1 VIEW COMPARISON:  Portable exam 1251 hours compared to 09/24/2006 FINDINGS: Normal heart size, mediastinal contours, and pulmonary vascularity. Lungs mildly hyperinflated but clear. No infiltrate, pleural effusion or pneumothorax. Osseous structures unremarkable. IMPRESSION: No acute abnormalities. Electronically Signed   By: Lavonia Dana M.D.   On: 04/20/2020 12:58    Procedures Procedures (including critical care time)  Medications Ordered in ED Medications  sodium chloride 0.9 % bolus 1,000 mL (0 mLs Intravenous Stopped  04/20/20 1321)    ED Course  I have reviewed the triage vital signs and the nursing notes.  Pertinent labs & imaging results that were available during my care of the patient were reviewed by me and considered in my medical decision making (see chart for details).    MDM Rules/Calculators/A&P                          55 year old female presents the emergency department with nausea/vomiting, severe weakness and possible seizure.  While being transported to the ambulance the patient states that she was  having arm shaking.  EMS told the husband it looked like she was having a seizure.  Patient has amnesia of her ambulance ride, triage note reports that EMS witnessed a seizure with a 10-minute postictal period.  I did not get to talk to EMS, unclear if there was further seizure activity in the ambulance during transfer.  Otherwise it does not sound like this was genuine seizure activity that she was experiencing in the home/hallway.  Head CT looks normal.  Blood work shows baseline AKI and anemia.  Patient has been hydrated, treated for nausea.  On reevaluation she is unable to p.o. or even maneuver herself in the bed due to severe weakness.  She will require admission and neurology consult.  I have not been able to personally talk to neurology as of the time of admission for consult. Final Clinical Impression(s) / ED Diagnoses Final diagnoses:  None    Rx / DC Orders ED Discharge Orders    None       Lorelle Gibbs, DO 04/20/20 1651

## 2020-04-21 DIAGNOSIS — N179 Acute kidney failure, unspecified: Secondary | ICD-10-CM

## 2020-04-21 DIAGNOSIS — F445 Conversion disorder with seizures or convulsions: Secondary | ICD-10-CM

## 2020-04-21 DIAGNOSIS — L89152 Pressure ulcer of sacral region, stage 2: Secondary | ICD-10-CM | POA: Diagnosis present

## 2020-04-21 DIAGNOSIS — L8990 Pressure ulcer of unspecified site, unspecified stage: Secondary | ICD-10-CM | POA: Insufficient documentation

## 2020-04-21 LAB — COMPREHENSIVE METABOLIC PANEL
ALT: 12 U/L (ref 0–44)
AST: 14 U/L — ABNORMAL LOW (ref 15–41)
Albumin: 2.1 g/dL — ABNORMAL LOW (ref 3.5–5.0)
Alkaline Phosphatase: 103 U/L (ref 38–126)
Anion gap: 9 (ref 5–15)
BUN: 57 mg/dL — ABNORMAL HIGH (ref 6–20)
CO2: 21 mmol/L — ABNORMAL LOW (ref 22–32)
Calcium: 7.9 mg/dL — ABNORMAL LOW (ref 8.9–10.3)
Chloride: 101 mmol/L (ref 98–111)
Creatinine, Ser: 3.44 mg/dL — ABNORMAL HIGH (ref 0.44–1.00)
GFR, Estimated: 15 mL/min — ABNORMAL LOW (ref >60)
Glucose, Bld: 89 mg/dL (ref 70–99)
Potassium: 3.9 mmol/L (ref 3.5–5.1)
Sodium: 131 mmol/L — ABNORMAL LOW (ref 135–145)
Total Bilirubin: 0.9 mg/dL (ref 0.3–1.2)
Total Protein: 5.5 g/dL — ABNORMAL LOW (ref 6.5–8.1)

## 2020-04-21 LAB — RETICULOCYTES
Immature Retic Fract: 6.9 % (ref 2.3–15.9)
RBC.: 2.91 MIL/uL — ABNORMAL LOW (ref 3.87–5.11)
Retic Count, Absolute: 49.2 10*3/uL (ref 19.0–186.0)
Retic Ct Pct: 1.7 % (ref 0.4–3.1)

## 2020-04-21 LAB — CK: Total CK: 19 U/L — ABNORMAL LOW (ref 38–234)

## 2020-04-21 LAB — CBC
HCT: 27.3 % — ABNORMAL LOW (ref 36.0–46.0)
Hemoglobin: 8.5 g/dL — ABNORMAL LOW (ref 12.0–15.0)
MCH: 27.2 pg (ref 26.0–34.0)
MCHC: 31.1 g/dL (ref 30.0–36.0)
MCV: 87.5 fL (ref 80.0–100.0)
Platelets: 105 10*3/uL — ABNORMAL LOW (ref 150–400)
RBC: 3.12 MIL/uL — ABNORMAL LOW (ref 3.87–5.11)
RDW: 15.9 % — ABNORMAL HIGH (ref 11.5–15.5)
WBC: 8.7 10*3/uL (ref 4.0–10.5)
nRBC: 0 % (ref 0.0–0.2)

## 2020-04-21 LAB — IRON AND TIBC: Iron: 43 ug/dL (ref 28–170)

## 2020-04-21 LAB — MAGNESIUM: Magnesium: 1.4 mg/dL — ABNORMAL LOW (ref 1.7–2.4)

## 2020-04-21 LAB — PHOSPHORUS: Phosphorus: 3.6 mg/dL (ref 2.5–4.6)

## 2020-04-21 LAB — TSH: TSH: 2.338 u[IU]/mL (ref 0.350–4.500)

## 2020-04-21 LAB — FOLATE: Folate: 7.4 ng/mL (ref >5.9)

## 2020-04-21 LAB — HIV ANTIBODY (ROUTINE TESTING W REFLEX): HIV Screen 4th Generation wRfx: NONREACTIVE

## 2020-04-21 LAB — VITAMIN B12: Vitamin B-12: 977 pg/mL — ABNORMAL HIGH (ref 180–914)

## 2020-04-21 LAB — FERRITIN: Ferritin: 2188 ng/mL — ABNORMAL HIGH (ref 11–307)

## 2020-04-21 MED ORDER — MAGNESIUM SULFATE 2 GM/50ML IV SOLN
2.0000 g | Freq: Once | INTRAVENOUS | Status: AC
  Administered 2020-04-21: 2 g via INTRAVENOUS
  Filled 2020-04-21: qty 50

## 2020-04-21 MED ORDER — APIXABAN 2.5 MG PO TABS
2.5000 mg | ORAL_TABLET | Freq: Two times a day (BID) | ORAL | Status: DC
  Administered 2020-04-21 – 2020-04-22 (×3): 2.5 mg via ORAL
  Filled 2020-04-21 (×3): qty 1

## 2020-04-21 MED ORDER — MAGNESIUM OXIDE 400 (241.3 MG) MG PO TABS
400.0000 mg | ORAL_TABLET | Freq: Two times a day (BID) | ORAL | Status: DC
Start: 2020-04-21 — End: 2020-05-05
  Administered 2020-04-21 – 2020-05-05 (×28): 400 mg via ORAL
  Filled 2020-04-21 (×28): qty 1

## 2020-04-21 NOTE — Evaluation (Signed)
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 Of note pt with similar symptoms and presented to [facility] ED where she also developed AKI and a large deep wound of her right labia and inner thigh. Imaging was performed which revealed large right perineal mass with new extension into surrounding soft tissue with stranding and gas. No abscess collection noted. She was evaluated by gen surg and gyn/oncology who recommended IV abx alone. Pt now admitted for further work up. CT head with no acute findings.   Clinical Impression   This 55 y/o female presents with the above. PTA Pt reports typically she is independent with ADL and mobility tasks. Pt reports recent instances of "seeing a bright light", and having syncopy/seizure like symptoms thereafter, reports has happened both when sitting/standing. Today pt tolerating sitting EOB, standing to RW and taking few side steps along EOB, overall with minA+2 throughout. Pt with no instances as described above with mobility today. She currently requires up to maxA for LB ADL. Pt also currently with global weakness/fatigue with functional tasks. She will benefit from continued acute OT services and given current status recommend CIR level therapies at time of discharge to maximize her overall safety and independence with ADL and mobility.     Follow Up Recommendations  CIR    Equipment Recommendations  Wheelchair cushion (measurements OT);Wheelchair (measurements OT);3 in 1 bedside commode;Other (comment) (TBD in next venue)    Recommendations for Other Services Rehab consult     Precautions / Restrictions  Precautions Precautions: Fall Precaution Comments: pt reports x2 falls PTA, reports recent episodes of presyncopy/seizure activity Restrictions Weight Bearing Restrictions: No      Mobility Bed Mobility Overal bed mobility: Needs Assistance Bed Mobility: Supine to Sit;Sit to Supine     Supine to sit: Min assist;+2 for physical assistance;+2 for safety/equipment Sit to supine: Mod assist;+2 for safety/equipment;+2 for physical assistance   General bed mobility comments: pt able to initiate LEs over EOB with some assist to fully scoot hips, elevate trunk. pt does endorse dizziness when returning to supine initially    Transfers Overall transfer level: Needs assistance Equipment used: Rolling walker (2 wheeled) Transfers: Sit to/from Stand Sit to Stand: Min assist;+2 physical assistance;+2 safety/equipment         General transfer comment: boosting and steadying assist, pt without signs of presyncopy today with mobility tasks    Balance Overall balance assessment: Needs assistance;History of Falls Sitting-balance support: Feet supported Sitting balance-Leahy Scale: Poor Sitting balance - Comments: pt with prefence for UE support   Standing balance support: Bilateral upper extremity supported Standing balance-Leahy Scale: Poor Standing balance comment: reliant on UE support/external assist                           ADL either performed or assessed with clinical judgement   ADL Overall ADL's : Needs assistance/impaired Eating/Feeding: Modified independent;Sitting;Bed level   Grooming: Minimal assistance;Wash/dry face;Sitting Grooming Details (indicate cue type and reason): intermittent minA for sitting balance when in unsupported sitting Upper Body Bathing: Minimal assistance;Sitting   Lower Body Bathing: Moderate assistance;+2 for safety/equipment;Sit to/from stand   Upper Body  Dressing : Minimal assistance;Sitting   Lower Body Dressing: Maximal  assistance;+2 for safety/equipment;Sit to/from stand Lower Body Dressing Details (indicate cue type and reason): assist to doff/don socks Toilet Transfer: Minimal assistance;+2 for safety/equipment;Stand-pivot;BSC Toilet Transfer Details (indicate cue type and reason): simulated via transfer to/from EOB         Functional mobility during ADLs: Minimal assistance;+2 for safety/equipment;+2 for physical assistance;Rolling walker                           Pertinent Vitals/Pain Pain Assessment: Faces Faces Pain Scale: Hurts little more Pain Location: L thigh with certain movements Pain Descriptors / Indicators: Discomfort Pain Intervention(s): Monitored during session;Repositioned     Hand Dominance     Extremity/Trunk Assessment Upper Extremity Assessment Upper Extremity Assessment: Generalized weakness   Lower Extremity Assessment Lower Extremity Assessment: Defer to PT evaluation       Communication Communication Communication: No difficulties   Cognition Arousal/Alertness: Awake/alert Behavior During Therapy: Flat affect;WFL for tasks assessed/performed Overall Cognitive Status: Within Functional Limits for tasks assessed                                 General Comments: for basic tasks assessed today   General Comments       Exercises     Shoulder Instructions      Home Living Family/patient expects to be discharged to:: Private residence Living Arrangements: Spouse/significant other Available Help at Discharge: Family;Available PRN/intermittently Type of Home: House Home Access: Stairs to enter [employer] of Steps: 7 STE in front no rails, 3STE in the back with rails but cannot reach both   Home Layout: Two level;Able to live on main level with bedroom/bathroom Alternate Level Stairs-Number of Steps: flight but can stay on main level   Bathroom Shower/Tub: Tub/shower unit;Walk-in shower (downstairs bathroom is a tub)    Biochemist, clinical: Standard     Home Equipment: Environmental consultant - 2 wheels;Cane - single point;Transport chair          Prior Functioning/Environment Level of Independence: Independent                 OT Problem List: Decreased strength;Decreased range of motion;Decreased activity tolerance;Impaired balance (sitting and/or standing);Decreased knowledge of use of DME or AE;Pain      OT Treatment/Interventions: Self-care/ADL training;Therapeutic exercise;Energy conservation;DME and/or AE instruction;Therapeutic activities;Patient/family education;Balance training    OT Goals(Current goals can be found in the care plan section) Acute Rehab OT Goals Patient Stated Goal: return to PLOF OT Goal Formulation: With patient Time For Goal Achievement: 05/05/20 Potential to Achieve Goals: Good  OT Frequency: Min 2X/week   Barriers to D/C:            Co-evaluation PT/OT/SLP Co-Evaluation/Treatment: Yes Reason for Co-Treatment: For patient/therapist safety;To address functional/ADL transfers   OT goals addressed during session: ADL's and self-care      AM-PAC OT "6 Clicks" Daily Activity     Outcome Measure Help from another person eating meals?: None Help from another person taking care of personal grooming?: A Little Help from another person toileting, which includes using toliet, bedpan, or urinal?: A Lot Help from another person bathing (including washing, rinsing, drying)?: A Lot Help from another person to put on and taking off regular upper body clothing?: A Little Help from another person to put on and taking off regular lower body clothing?: A Lot 6 Click  Score: 16   End of Session Equipment Utilized During Treatment: Gait belt;Rolling walker Nurse Communication: Mobility status  Activity Tolerance: Patient tolerated treatment well Patient left: in bed;with call bell/phone within reach;with bed alarm set  OT Visit Diagnosis: Other abnormalities of gait and mobility  (R26.89);Unsteadiness on feet (R26.81);Muscle weakness (generalized) (M62.81)                Time: 7622-6333 OT Time Calculation (min): 34 min Charges:  OT General Charges $OT Visit: 1 Visit OT Evaluation $OT Eval Moderate Complexity: Lawson, OT Acute Rehabilitation Services Pager [phone] Office [phone]   Raymondo Band 04/21/2020, 10:54 AM

## 2020-04-21 NOTE — Consult Note (Signed)
[place] Date: 04/20/2020 04/21/2020 Rexene Agent Requesting Physician:  Heber Morriston MD  Reason for Consult:  Progressive renal failure in KT, Failure to Thrive HPI:  89F whose PMH Incudes:  ESRD s/p KT 2001 and again in 2014, followed at [facility] historically by Lorrene Reid, now to see Johnney Ou; also sees [facility], now on Tac/pred, MMF recently held.  SCr through 2021 was around 2, then has worsened in the context of below.   Stage III vulvar cancer s/p XRT/CTX with [facility]c ending late 2021  Active  R labila soft tissue infection, on ABX and rec wound care  50lb weight loss, unintentional  Progressive sarcopenia/FTT, now limited in ability to stand  She presented to ED from home after having weakness and ? Of seizure like event.  She has prlonged [facility] hospitalization earlier this month. Saw nephrology there, MMF held, and HD discussed but patient declined to receive. She was to f/u with Johnney Ou 1/21 at [place].    She presents with the above.  SCr 3.44, K 3.9, HCO3 21.  She is bothered by N/V to most solids but not liquids with accompanying weight loss.  This is her most disturbing symptom.  No SOB, LEE.     Creatinine (mg/dL)  Date Value  11/26/2012 2.86 (H)   Creatinine, Ser (mg/dL)  Date Value  04/21/2020 3.44 (H)  04/20/2020 3.33 (H)  ] I/Os: I/O last 3 completed shifts: In: 240 [P.O.:240] Out: -    ROS Balance of 12 systems is negative w/ exceptions as above  PMH see above FH No family history on file. SH  has no history on file for tobacco use, alcohol use, and drug use. Allergies  Allergies  Allergen Reactions  . Codeine     vomiting  . Hydromorphone Nausea And Vomiting  . Oxycodone-Acetaminophen Itching, Nausea Only, Other (See Comments) and Nausea And Vomiting    Other Reaction: HA   Home medications Prior to Admission medications   Medication Sig Start Date End Date Taking? Authorizing Provider  apixaban (ELIQUIS) 2.5 MG TABS tablet Take 2.5 mg by  mouth 2 (two) times daily.   Yes [provider]  ferrous sulfate 325 (65 FE) MG tablet Take 325 mg by mouth 2 (two) times daily with a meal.   Yes [provider]  levofloxacin (LEVAQUIN) 500 MG tablet Take 500 mg by mouth every other day. For 8 days   Yes [provider]  loperamide (IMODIUM) 2 MG capsule Take 2 mg by mouth 4 (four) times daily as needed for diarrhea or loose stools.   Yes [provider]  magnesium oxide (MAG-OX) 400 MG tablet Take 400 mg by mouth 2 (two) times daily.   Yes [provider]  methadone (DOLOPHINE) 5 MG tablet Take 2.5 mg by mouth every 12 (twelve) hours.   Yes [provider]  metroNIDAZOLE (FLAGYL) 500 MG tablet Take 500 mg by mouth 3 (three) times daily. For 7 days   Yes [provider]  ondansetron (ZOFRAN) 8 MG tablet Take 8 mg by mouth every 8 (eight) hours as needed for nausea or vomiting.   Yes [provider]  predniSONE (DELTASONE) 5 MG tablet Take 5 mg by mouth daily with breakfast.   Yes [provider]  tacrolimus (PROGRAF) 0.5 MG capsule Take 0.5 mg by mouth daily.   Yes [provider]  valACYclovir (VALTREX) 500 MG tablet Take 500 mg by mouth 2 (two) times daily.   Yes [provider]  Current Medications Scheduled Meds: . apixaban  2.5 mg Oral BID  . levofloxacin  500 mg Oral QODAY  . methadone  2.5 mg Oral Q12H  . metroNIDAZOLE  500 mg Oral TID  . ondansetron (ZOFRAN) IV  4 mg Intravenous Once  . predniSONE  5 mg Oral Q breakfast  . tacrolimus  0.5 mg Oral Daily   Continuous Infusions: . lactated ringers     PRN Meds:.  CBC Recent Labs  Lab 04/20/20 1255 04/21/20 0034  WBC 9.6 8.7  NEUTROABS 7.5  --   HGB 8.4* 8.5*  HCT 26.6* 27.3*  MCV 87.2 87.5  PLT 102* 212*   Basic Metabolic Panel Recent Labs  Lab 04/20/20 1255 04/21/20 0034  NA 133* 131*  K 3.9 3.9  CL 99 101  CO2 21* 21*  GLUCOSE 99 89  BUN 59* 57*  CREATININE  3.33* 3.44*  CALCIUM 8.0* 7.9*  PHOS  --  3.6    Physical Exam  Blood pressure 130/89, pulse 96, temperature 98.7 F (37.1 C), temperature source Oral, resp. rate 16, height 5\' 5"  (1.651 m), weight 51.6 kg, SpO2 98 %. GEN: chronically ill appearing, NAD, very thin ENT: NCAT, mild temporal wasting EYES: sunken, EOMI CV: RRR nl s1s2 PULM: CTAB ABD: RLQ allograft not tender, no bruits.  S. SKIN: scattered diffuse ecchymoses EXT:No LEE  Assessment 60F CKD4T, stage III Vulvar Cancer finishing CTX/XRT late 2021, FTT unintentional weight loss, persistent N/V esp to solids  1. S/p KT, CKD4, on Tac/Pred, MMF held during #4 2. Stage 3 Vulvar Ca followed by GynOnc at [facility] 3. Persistent N/V, unintentional weight loss 4. R soft tissue labial wound 5. Mild TCP 6. Mild hypoNa 7. Anemia 8. ? Seizure like activity, neuro following 9. Hx/o femoral DVT on DOAC  Plan 1. Discussed utility of HD here, which she does not wish to begin, at least for now.  Not sure how much it would help her either.  I think N/V is multifactorial  Will cont to discuss RRT based on chagnes in clinical status 2. Suggest palliative to engage, she is clear that hospice is not something she is interested in 3. Rec GI eval to make sure not having treatable cause of dysphagia 4. Cont Tac/Pred  5. Daily weights, Daily Renal Panel, Strict I/Os, Avoid nephrotoxins (NSAIDs, judicious IV Contrast)  6. Will follow along   Rexene Agent  04/21/2020, 3:52 PM

## 2020-04-21 NOTE — Progress Notes (Signed)
Arrived to room pt. Explained to pt EEG. Half way through the hook up the pt verbalizes she does not want this done. RN aware and she will contact ordering doctor.

## 2020-04-21 NOTE — Evaluation (Signed)
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 Of note pt with similar symptoms and presented to [facility] ED where she also developed AKI and a large deep wound of her right labia and inner thigh. Imaging was performed which revealed large right perineal mass with new extension into surrounding soft tissue with stranding and gas. No abscess collection noted. She was evaluated by gen surg and gyn/oncology who recommended IV abx alone. Pt now admitted for further work up. CT head with no acute findings.  Clinical Impression   Patient received in bed, anxious about mobility due to history of syncopal episodes and possible seizures. Able to mobilize on a Min-ModAx2 basis with RW, but very tremulous and generally weak. Tolerated getting to EOB and then taking lateral side steps at EOB with 2 person assist, but easily fatigued. Very poor balance and gross strength. Left in bed with all needs met, bed alarm active. Will benefit from SNF moving forward.     Follow Up Recommendations SNF;Supervision for mobility/OOB    Equipment Recommendations  Rolling walker with 5" wheels;3in1 (PT);Wheelchair (measurements PT);Wheelchair cushion (measurements PT)    Recommendations for Other Services       Precautions / Restrictions Precautions Precautions: Fall Precaution Comments: pt reports x2 falls PTA, reports recent episodes of presyncopy/seizure activity Restrictions Weight Bearing Restrictions: No      Mobility  Bed Mobility Overal bed mobility: Needs Assistance Bed Mobility: Supine to Sit;Sit to Supine     Supine to sit: Min assist;+2 for physical  assistance;+2 for safety/equipment Sit to supine: Mod assist;+2 for safety/equipment;+2 for physical assistance   General bed mobility comments: pt able to initiate LEs over EOB with some assist to fully scoot hips, elevate trunk. pt does endorse dizziness when returning to supine initially    Transfers Overall transfer level: Needs assistance Equipment used: Rolling walker (2 wheeled) Transfers: Sit to/from Stand Sit to Stand: Min assist;+2 physical assistance;+2 safety/equipment         General transfer comment: boosting and steadying assist, pt without signs of presyncopy today with mobility tasks  Ambulation/Gait         Gait velocity: decreased   General Gait Details: able to take lateral side steps along side of bed with MinAx2- did not progress gait due to fatigue  Stairs            Wheelchair Mobility    Modified Rankin (Stroke Patients Only)       Balance Overall balance assessment: Needs assistance;History of Falls Sitting-balance support: Feet supported Sitting balance-Leahy Scale: Poor Sitting balance - Comments: pt with prefence for UE support   Standing balance support: Bilateral upper extremity supported Standing balance-Leahy Scale: Poor Standing balance comment: reliant on UE support/external assist                             Pertinent Vitals/Pain Pain Assessment: Faces Faces Pain Scale: Hurts little more Pain Location: L thigh with certain movements Pain Descriptors / Indicators: Discomfort;Aching Pain Intervention(s): Limited activity within patient's tolerance;Repositioned;Monitored during session    Home Living Family/patient expects to be discharged to:: Private residence Living Arrangements:  Spouse/significant other Available Help at Discharge: Family;Available PRN/intermittently Type of Home: House Home Access: Stairs to enter   [employer] of Steps: 7 STE in front no rails, 3STE in the back with rails  but cannot reach both Home Layout: Two level;Able to live on main level with bedroom/bathroom Home Equipment: Gilford Rile - 2 wheels;Cane - single point;Transport chair      Prior Function Level of Independence: Independent               Hand Dominance        Extremity/Trunk Assessment   Upper Extremity Assessment Upper Extremity Assessment: Defer to OT evaluation    Lower Extremity Assessment Lower Extremity Assessment: Generalized weakness    Cervical / Trunk Assessment Cervical / Trunk Assessment: Kyphotic  Communication   Communication: No difficulties  Cognition Arousal/Alertness: Awake/alert Behavior During Therapy: Flat affect;WFL for tasks assessed/performed Overall Cognitive Status: Within Functional Limits for tasks assessed                                 General Comments: for basic tasks assessed today      General Comments      Exercises     Assessment/Plan    PT Assessment Patient needs continued PT services  PT Problem List Decreased strength;Decreased knowledge of use of DME;Decreased activity tolerance;Decreased safety awareness;Decreased balance;Decreased mobility;Decreased coordination       PT Treatment Interventions DME instruction;Balance training;Gait training;Stair training;Functional mobility training;Patient/family education;Therapeutic activities;Therapeutic exercise;Wheelchair mobility training    PT Goals (Current goals can be found in the Care Plan section)  Acute Rehab PT Goals Patient Stated Goal: return to PLOF PT Goal Formulation: With patient Time For Goal Achievement: 05/05/20 Potential to Achieve Goals: Fair    Frequency Min 2X/week   Barriers to discharge        Co-evaluation   Reason for Co-Treatment: For patient/therapist safety;To address functional/ADL transfers   OT goals addressed during session: ADL's and self-care       AM-PAC PT "6 Clicks" Mobility  Outcome Measure Help needed turning  from your back to your side while in a flat bed without using bedrails?: A Little Help needed moving from lying on your back to sitting on the side of a flat bed without using bedrails?: A Lot Help needed moving to and from a bed to a chair (including a wheelchair)?: A Lot Help needed standing up from a chair using your arms (e.g., wheelchair or bedside chair)?: A Lot Help needed to walk in hospital room?: A Lot Help needed climbing 3-5 steps with a railing? : Total 6 Click Score: 12    End of Session Equipment Utilized During Treatment: Gait belt Activity Tolerance: Patient tolerated treatment well Patient left: in bed;with call bell/phone within reach;with bed alarm set Nurse Communication: Mobility status PT Visit Diagnosis: Unsteadiness on feet (R26.81);Difficulty in walking, not elsewhere classified (R26.2);Muscle weakness (generalized) (M62.81);History of falling (Z91.81)    Time: 4782-9562 PT Time Calculation (min) (ACUTE ONLY): 34 min   Charges:   PT Evaluation $PT Eval Moderate Complexity: 1 Mod (co-eval with OT)          Windell Norfolk, DPT, PN1   Supplemental Physical Therapist [place]    Pager [phone] Acute Rehab Office [phone]

## 2020-04-21 NOTE — Progress Notes (Signed)
Ok to resume home dose apixaban for recent DVT in 9/21 per Dr. Johnney Ou.   Onnie Boer, PharmD, BCIDP, AAHIVP, CPP Infectious Disease Pharmacist 04/21/2020 9:43 AM

## 2020-04-21 NOTE — Progress Notes (Signed)
At 2340, patient received 2.5mg  of methadone. This tablet came in as 5 mg. The reminder pill which is 2.5mg  was wasted in the steri cycle witnessed by Yvette Rack, RN.

## 2020-04-21 NOTE — Consult Note (Addendum)
WOC Nurse Consult Note: Reason for Consult: Neoplastic lesions of right labial/inguinal area, also mons pubis. Stage 2 pressure injuries vs neoplastic lesions to coccyx and left lower buttock Wound type:neoplasm plus pressure plus moisture Pressure Injury POA: Yes Measurement: Coccygeal Stage 2 PI/ partial thickness skin loss measuring 1.2cm x 1.6 cm x 0.2cm with pink, painful and moist wound bed, scant serous exudate Left lower buttock Stage 2 PI/partial thickness skin loss measuring 1.5cm x 2cm x 0.2cm with pink, painful and moist wound bed, scant serous exudate Right inguinal/right labial full thickness wound with red and yellow (nonviable) wound bed, approximately 11cm x 7cm with depth unable to be assessed due to discomfort.  Patient reports moderate amount exudate. Strong odor noted in area. Scattered open areas on pubis with yellow wound bed largest measures 0.6cm round with depth not assessed. Wound bed:As noted above Drainage (amount, consistency, odor)  Periwound:Indurated, discolored with deeper hues of brown (tawny) to purple. Dressing procedure/placement/frequency: I will provide patient with a mattress replacement with low air loss feature and topical wound care guidance via the Order for Nursing. I am assisted with today's assessment by the patient's bedside RN, M. Louis Meckel.  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 She is engaged and contributes to the [place] and reports that she previously used white petrolatum gauze in the right inguinal/labial wound and secured with ABD pads and her "boy-short" tyle briefs. She is open to securing with mesh briefs while in house because they are washable and reusable and because the leg elastic is non-binding, but will still hold the ABD pads in place. She is not ambulating at this time due to weakness, she reports. She states that the  larger silicone dressings (for the sacrum) are ineffective for her due to saturation during using the bedpan and requests that we use the smaller foam dressings as they do decrease the pain in the area of broken skin but are less traumatic when removed due to saturation/soiling. Together with patient we agree to use a calcium alginate dressing to the primary area of neoplasm/wounding, the right inguinal area. Calcium alginate is a biocompatible dressing that is absorbent and that is easily removed wth irrigation. It has known properties for decreasing pain and is also hemostatic. Although this feature is not needed at this time, it may in the future given this location and rich perfusion of these tissues.  This is an most unfortunate situation and further consultation with gyn/onc or surgical associates is indicated for oversight of the wound care POC as it exceeds the skill set of [place] nursing.  If you agree, please Order/Arrange consult with those specialists.  [place] nursing team will not follow, but will remain available to this patient, the nursing and medical teams.  Please re-consult if needed. Thanks, Maudie Flakes, MSN, RN, [place], Arther Abbott  Pager# [phone]

## 2020-04-21 NOTE — Discharge Instructions (Signed)
Information on my medicine - ELIQUIS (apixaban)  This medication education was reviewed with me or my healthcare representative as part of my discharge preparation.  The pharmacist that spoke with me during my hospital stay was:  Onnie Boer, RPH-CPP  Why was Eliquis prescribed for you? Eliquis was prescribed to treat blood clots that may have been found in the veins of your legs (deep vein thrombosis) or in your lungs (pulmonary embolism) and to reduce the risk of them occurring again.  What do You need to know about Eliquis ? Continue Eliquis 2.5 mg tablet  TWICE daily.  Eliquis may be taken with or without food.   Try to take the dose about the same time in the morning and in the evening. If you have difficulty swallowing the tablet whole please discuss with your pharmacist how to take the medication safely.  Take Eliquis exactly as prescribed and DO NOT stop taking Eliquis without talking to the doctor who prescribed the medication.  Stopping may increase your risk of developing a new blood clot.  Refill your prescription before you run out.  After discharge, you should have regular check-up appointments with your healthcare provider that is prescribing your Eliquis.    What do you do if you miss a dose? If a dose of ELIQUIS is not taken at the scheduled time, take it as soon as possible on the same day and twice-daily administration should be resumed. The dose should not be doubled to make up for a missed dose.  Important Safety Information A possible side effect of Eliquis is bleeding. You should call your healthcare provider right away if you experience any of the following: ? Bleeding from an injury or your nose that does not stop. ? Unusual colored urine (red or dark brown) or unusual colored stools (red or black). ? Unusual bruising for unknown reasons. ? A serious fall or if you hit your head (even if there is no bleeding).  Some medicines may interact with Eliquis and  might increase your risk of bleeding or clotting while on Eliquis. To help avoid this, consult your healthcare provider or pharmacist prior to using any new prescription or non-prescription medications, including herbals, vitamins, non-steroidal anti-inflammatory drugs (NSAIDs) and supplements.  This website has more information on Eliquis (apixaban): http://www.eliquis.com/eliquis/home

## 2020-04-21 NOTE — Progress Notes (Signed)
HD#1 Subjective:  Patient reports she attempted to eat breakfast - a couple of bites of bacon and some eggs - but threw it up. She states that yesterday, her intention was to get to [facility] where her transplant was but she started seizing. She remembers the event at home and was fully alert when her arms were shaking. She says that EMS transported through home and her arms began moving. She states she was having a seizure. Only thing she can't remember is the ride to the hospital. Reports she is having burning when she urinates which she thinks is from urine touching her infection. She states she is hesitant to get up to urinate because she has been lightheaded. States her infection is "fine," no worsening pain or drainage.   She mentions that her main concern is her kidney. 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 No more acute complaint now.  Objective:  Vital signs in last 24 hours: Vitals:   04/20/20 2000 04/20/20 2216 04/21/20 0432 04/21/20 1220  BP: (!) 135/93 127/80 (!) 135/95 130/89  Pulse: (!) 109 100 (!) 102 96  Resp: 13 20 16 16   Temp:  98.7 F (37.1 C) 99.2 F (37.3 C) 98.7 F (37.1 C)  TempSrc:  Oral Oral Oral  SpO2: 100% 100% 100% 98%  Weight:  51.6 kg    Height:  5\' 5"  (1.651 m)     Supplemental O2: Room Air SpO2: 98 %  Physical Exam Constitutional:      General: She is not in acute distress.    Appearance: She is ill-appearing.     Comments: Cachectic   Cardiovascular:     Rate and Rhythm: Normal rate and regular rhythm.     Heart sounds: Normal heart sounds. No murmur heard.   Abdominal:     Comments: Mild tenderness on lower quadrant/suprapubic area above the skin lesion/infection area  Genitourinary:    Comments: Area of swelling and wound above genital area with no active draignage but with residual discharge Musculoskeletal:     Right lower leg: No edema.     Left lower leg: No edema.  Neurological:     General: No focal deficit  present.     Mental Status: She is oriented to person, place, and time.     Comments: Bilateral weakness of lower extremity (4/5)     Physical Exam:  Physical Exam Constitutional:      General: She is not in acute distress.    Appearance: She is ill-appearing.     Comments: Cachectic   Cardiovascular:     Rate and Rhythm: Normal rate and regular rhythm.     Heart sounds: Normal heart sounds. No murmur heard.   Abdominal:     Comments: Mild tenderness on lower quadrant/suprapubic area above the skin lesion/infection area  Genitourinary:    Comments: Area of swelling and wound above genital area with no active draignage but with residual discharge Musculoskeletal:     Right lower leg: No edema.     Left lower leg: No edema.  Neurological:     General: No focal deficit present.     Mental Status: She is oriented to person, place, and time.     Comments: Bilateral weakness of lower extremity (4/5)     Filed Weights   04/20/20 1147 04/20/20 2216  Weight: 54 kg 51.6 kg     Intake/Output Summary (Last 24 hours) at 04/21/2020 1700 Last data filed at 04/21/2020 [phone]  Gross per 24 hour  Intake 240 ml  Output --  Net 240 ml   Net IO Since Admission: 240 mL [04/21/20 1700]  Pertinent Labs: CBC Latest Ref Rng & Units 04/21/2020 04/20/2020 11/26/2012  WBC 4.0 - 10.5 K/uL 8.7 9.6 10.4  Hemoglobin 12.0 - 15.0 g/dL 8.5(L) 8.4(L) 10.5(L)  Hematocrit 36.0 - 46.0 % 27.3(L) 26.6(L) 31.4(L)  Platelets 150 - 400 K/uL 105(L) 102(L) 190    CMP Latest Ref Rng & Units 04/21/2020 04/20/2020 11/26/2012  Glucose 70 - 99 mg/dL 89 99 88  BUN 6 - 20 mg/dL 57(H) 59(H) 33(H)  Creatinine 0.44 - 1.00 mg/dL 3.44(H) 3.33(H) 2.86(H)  Sodium 135 - 145 mmol/L 131(L) 133(L) 139  Potassium 3.5 - 5.1 mmol/L 3.9 3.9 4.6  Chloride 98 - 111 mmol/L 101 99 105  CO2 22 - 32 mmol/L 21(L) 21(L) 22  Calcium 8.9 - 10.3 mg/dL 7.9(L) 8.0(L) 9.7  Total Protein 6.5 - 8.1 g/dL 5.5(L) 5.2(L) 8.2  Total Bilirubin 0.3 - 1.2  mg/dL 0.9 1.0 0.5  Alkaline Phos 38 - 126 U/L 103 110 82  AST 15 - 41 U/L 14(L) 18 11(L)  ALT 0 - 44 U/L 12 10 19     Imaging: No results found.  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   Generalized Weakness Multifactorial with deconditioning given recent ~ 2 week hospital admission and lack of PO intake secondary to her nausea and vomiting. Patient also endorsed decrease in mobility secondary to her pain from her right thigh wound. Unlikely due to acute infection given lack of leukocytosis and patient is currently on antibiotics. She does endorse improvement since prior hospitalization. Low suspicion for GBS, however, patient with episodes of diarrhea during prior hospitalization. Head CT with tiny chronic left thalamic lacunar infarct, but nothing acute or metastatic seen.  -Daily BMP -Mg today is 1.4. Replacing with 2 gr IV Mg today and will repeat Mg tomorrow. 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   CT head without evidence of acute intracranial abnormalities. Electrolytes are stable, without hyponatremia or hypoglycemia. Asterixis negative upon examination. Patient without fever, leukocytosis and is hemodynamically stable, low suspicion for acute infection at this time. This may be in the setting of acute on chronic uremia. Will also discuss with patient tomorrow if she was unable to take her medications in setting of nausea/vomiting, possible  medication withdrawal.  CK not elevated. -Neurology consulted by EDP; but not call back. Will hold off of neurology consult now as her presentation is not suggestive of seizure. Will order EEG though. -Ordering EEG> Patient declined  -Daily BMP -Will consult nephrology given possible uremia.   Nausea/Vomiting Possibly due to patient's recent infection. Low suspicion for bowel obstruction given patient is having bowel movements and abdominal exam is reassuring. Zofran reduced episodes during prior admission however she has prolonged QTC on admission. Suspect this is secondary to anti-emetics use. Had emesis this AM after ate breakfast. No active nausea or vomiting now.  -  Continue to monitor -Renal diet -If becomes nauseated will need to use non QTc prolonging medications until repeat EKG   Right Labial Soft Tissue Infection Patient found to have infection of right labial mass, not consistent with necrotizing fasciitis at that time per documentation. Patient has been on levofloxacin and metronidazole with 3 week course, end date of 04/24/20. Patient notes improvement of pain.  On evaluation today, she has a large area of swollen soft tissue with deep wound deep in groin area. With no active discharge but seems to have some purulent discharge deep in the wound also some granulation tissue -Consult wound care -Continue abx of levofloxacin and metronidazole  Kidney Failure s/p kidney transplant 2001 and 8638 Patient uncertain of kidney failure etiology. Follow by [facility]. Initial transplant failed, patient placed on temporary dialysis at one time. Second transplant in 2014. Prior documentation notes they do not think recent transplant is salvageable. Do not believe patient requires dialysis at this time.   -Consult nephrology today. Appreciate recommendations. -Cellcept on hold in setting of recurrent infection. Continue Prednisone  Stage 3C Vulvar Cancer Patient  completed treatment at this time. Follows with gyn/onc at [facility]. Evaluated by them at prior admission who recommended  -follow up with gyn/onc on outpatient basis  Extremity Bruises  Patient notes bruises appeared after one of the antibiotics given caused her platelets to drop during prior hospitalization. Patient initially prescribed vanc and zosyn at that time and both were discontinued. She is also on prednisone at home that could be contributing. -Continue to monitor -Daily cbc  Diet: Renal IVF: None,None VTE: Eliquis Code: Full  Dispo: Anticipated discharge pending clinical improvement and work up [place] [facility] Resident PGY-1 Pager [phone] Please contact the on call pager after 5 pm and on weekends at [phone].

## 2020-04-22 ENCOUNTER — Encounter (HOSPITAL_COMMUNITY): Payer: Self-pay | Admitting: Internal Medicine

## 2020-04-22 DIAGNOSIS — R112 Nausea with vomiting, unspecified: Secondary | ICD-10-CM | POA: Diagnosis not present

## 2020-04-22 DIAGNOSIS — Z515 Encounter for palliative care: Secondary | ICD-10-CM | POA: Diagnosis not present

## 2020-04-22 DIAGNOSIS — Z7189 Other specified counseling: Secondary | ICD-10-CM | POA: Diagnosis not present

## 2020-04-22 DIAGNOSIS — R531 Weakness: Secondary | ICD-10-CM

## 2020-04-22 DIAGNOSIS — C519 Malignant neoplasm of vulva, unspecified: Secondary | ICD-10-CM | POA: Diagnosis not present

## 2020-04-22 LAB — CBC
HCT: 24.6 % — ABNORMAL LOW (ref 36.0–46.0)
Hemoglobin: 7.9 g/dL — ABNORMAL LOW (ref 12.0–15.0)
MCH: 27.9 pg (ref 26.0–34.0)
MCHC: 32.1 g/dL (ref 30.0–36.0)
MCV: 86.9 fL (ref 80.0–100.0)
Platelets: 106 10*3/uL — ABNORMAL LOW (ref 150–400)
RBC: 2.83 MIL/uL — ABNORMAL LOW (ref 3.87–5.11)
RDW: 15.9 % — ABNORMAL HIGH (ref 11.5–15.5)
WBC: 8.1 10*3/uL (ref 4.0–10.5)
nRBC: 0 % (ref 0.0–0.2)

## 2020-04-22 LAB — BASIC METABOLIC PANEL
Anion gap: 8 (ref 5–15)
BUN: 58 mg/dL — ABNORMAL HIGH (ref 6–20)
CO2: 23 mmol/L (ref 22–32)
Calcium: 8.1 mg/dL — ABNORMAL LOW (ref 8.9–10.3)
Chloride: 101 mmol/L (ref 98–111)
Creatinine, Ser: 3.43 mg/dL — ABNORMAL HIGH (ref 0.44–1.00)
GFR, Estimated: 15 mL/min — ABNORMAL LOW (ref >60)
Glucose, Bld: 107 mg/dL — ABNORMAL HIGH (ref 70–99)
Potassium: 3.9 mmol/L (ref 3.5–5.1)
Sodium: 132 mmol/L — ABNORMAL LOW (ref 135–145)

## 2020-04-22 LAB — MAGNESIUM: Magnesium: 1.9 mg/dL (ref 1.7–2.4)

## 2020-04-22 LAB — APTT: aPTT: 44 seconds — ABNORMAL HIGH (ref 24–36)

## 2020-04-22 LAB — PHOSPHORUS: Phosphorus: 4 mg/dL (ref 2.5–4.6)

## 2020-04-22 LAB — HEPARIN LEVEL (UNFRACTIONATED): Heparin Unfractionated: 1.84 IU/mL — ABNORMAL HIGH (ref 0.30–0.70)

## 2020-04-22 MED ORDER — PANTOPRAZOLE SODIUM 40 MG IV SOLR
40.0000 mg | Freq: Two times a day (BID) | INTRAVENOUS | Status: DC
  Administered 2020-04-22 – 2020-04-23 (×3): 40 mg via INTRAVENOUS
  Filled 2020-04-22 (×3): qty 40

## 2020-04-22 MED ORDER — TACROLIMUS 0.5 MG PO CAPS
0.5000 mg | ORAL_CAPSULE | Freq: Two times a day (BID) | ORAL | Status: DC
  Administered 2020-04-23 – 2020-05-05 (×26): 0.5 mg via ORAL
  Filled 2020-04-22 (×28): qty 1

## 2020-04-22 MED ORDER — HEPARIN (PORCINE) 25000 UT/250ML-% IV SOLN
850.0000 [IU]/h | INTRAVENOUS | Status: DC
  Administered 2020-04-22: 850 [IU]/h via INTRAVENOUS
  Filled 2020-04-22 (×2): qty 250

## 2020-04-22 MED ORDER — NEPRO/CARBSTEADY PO LIQD
237.0000 mL | Freq: Three times a day (TID) | ORAL | Status: DC
  Administered 2020-04-22: 237 mL via ORAL
  Filled 2020-04-22 (×2): qty 237

## 2020-04-22 NOTE — Progress Notes (Signed)
ANTICOAGULATION CONSULT NOTE - Initial Consult  Pharmacy Consult for heparin Indication: DVT  Allergies  Allergen Reactions  . Codeine     vomiting  . Hydromorphone Nausea And Vomiting  . Oxycodone-Acetaminophen Itching, Nausea Only, Other (See Comments) and Nausea And Vomiting    Other Reaction: HA    Patient Measurements: Height: 5\' 5"  (165.1 cm) Weight: 51.6 kg (113 lb 12.1 oz) IBW/kg (Calculated) : 57 Heparin Dosing Weight: 51.6 kg   Vital Signs: Temp: 98.5 F (36.9 C) (01/20 1219) Temp Source: Oral (01/20 1219) BP: 128/91 (01/20 1219) Pulse Rate: 90 (01/20 1219)  Labs: Recent Labs    04/20/20 1255 04/21/20 0034 04/22/20 0244  HGB 8.4* 8.5* 7.9*  HCT 26.6* 27.3* 24.6*  PLT 102* 105* 106*  CREATININE 3.33* 3.44* 3.43*  CKTOTAL  --  19*  --     Estimated Creatinine Clearance: 15.5 mL/min (A) (by C-G formula based on SCr of 3.43 mg/dL (H)).   Medical History: History reviewed. No pertinent past medical history.  Medications:  Eliquis 2.5 mg BID PTA - Last dose 1/20 @ 0815  Assessment: 55 y.o. F admitted for weakness, N/V, and seizure-like activity. NPO at midnight tonight for EGD, anticipated for tomorrow. On Eliquis PTA for Hx DVT, last dose 1/20 @08 :15. Pharmacy has been consulted to start heparin infusion. Today, patient's Hgb is 7.9 and Plt 106. Given recent Eliquis use, will trend aPTT and HL while on heparin.   Goal of Therapy:  Heparin level 0.3-0.7 units/ml aPTT 66-102 seconds Monitor platelets by anticoagulation protocol: Yes   Plan:  Start heparin infusion at 850 units/hr on 1/20 @2000  Check baseline aPTT and HL, then daily while on heparin Continue to monitor H&H and platelets  Claudina Lick, PharmD PGY1 [place] Resident 04/22/2020 2:32 PM  Please check [email or web address] for unit-specific pharmacy phone numbers.

## 2020-04-22 NOTE — PMR Pre-admission (Signed)
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 Date: 04/03/2016 - still active     Deduct: $0 (does not have)   OOP Max: $3,000 ($53.64 met) CIR: : $168/per admission SNF: $33/day; limited by medical necessity Outpatient: $33/per visit; limited by medical necessity review Home Health: 100% coverage; limited by medical necessity review DME: 80% coverage; 20% co-insurance Providers: In network  SECONDARY: none       Policy#:    Phone#:   The "Data Collection Information Summary" for patients in Inpatient Rehabilitation Facilities with attached "Privacy Act [place] Records" was provided and verbally reviewed with: Christina  Emergency Contact Information Contact Information    Mack Relation Home Work Mobile   [facility] Daughter [phone]        Current Medical History  Christina Admitting Diagnosis: Debility, Weakness History of Present Illness: Christina Mack is a 55 year old person with a PMHx of Stage IIIC Vulvar Cancer s/p chemoradiation, CKD s/p renal transplant (2001, 2014), HTN, HLD, 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 Her symptoms started 04/04/20, at this she presented to the [facility] ED and was admitted after having progressive weakness and decline in her overall functional status. At this time she also developed pain over a large deep wound of her right labia and inner thigh. She was found to have a WBC of 19, Lactacte of 1.6. Imaging was performed which revealed large  right perineal mass with new extension into surrounding soft tissue with stranding and gas. No abscess collection noted. She was evaluated by gen surg and gyn/oncology who recommended IV abx alone. There was initial concern for necrotizing fasciitis, however, both consults did not believe her wound was consistent with this. ID was also consulted, who recommended oral levofloxacin and metronidazole with an abx end date of 04/24/20.  During this admission, she was also evaluated for her vulvar cancer with which gyn onc recommended outpatient PET. Her hospital stay was also complicated by AKI on CKD Stage 5. Nephrology was consulted, who recommended holding Cellcept and reducing dose of Prograf. Re-initiation of HD was contemplated but ultimatley held off. Stay was also complicated by acute on chronic anemia requiring pRBC transfusion, diarrhea negative for C.diff and mild stable hyponatremia.  Since being discharged, Christina Mack notes she has been unable to eat or drink without vomiting, and is so weak that she can no longer stand. On arrival to the ED, Christina was noted to be tachycardic to 137, BP of 155/97, with respiratory rate of 29 with oxygen saturations of 100%. Initial lab work remarkable for anemia of 8.4 with platelets of 102; no leukocytosis. CMP remarkable for elevated BUN of 59 and creatinine of 3.33. CT head negative for acute intracranial abnormalities. 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 This has eroded  deeply since the end of her radiation therapy and became superinfected recently complicated by bacteremia due to bacteroides and eggerthella species.  RI with multilocular fluid collection extending in to the inferior left pelvic soft tissues surrounding the pubic rami and extending up to anterior thigh c/w abscess. Osteo suggested in the anterior left pubic symphysis.  Surgical services have evaluated - no surgical plans from gyn onc, ortho or  plastics at this time Per ID, pt now with  bactermeia due to bacteroides/eggerthella species. Pharmacy has been consulted for daptomycin dosing at higher dose of 7m/kg. Christina has been approved for CIR and will begin IV antibiotic therapy while inpatient. Plan per ID is to transition to oral levaquin and flagyl at discharge and finish 6 weeks total of antibiotics on February 26th. Pt. Is progressing with therapies and both PT and OT are recommending CIR-level therapies to assist return to PLOF.    [facility]  has been reviewed by the rehabilitation admission coordinator and physician.  Past Medical History  Past Medical History:  Diagnosis Date  . Complication of anesthesia    nausea and vomiting    Family History   family history is not on file.  Prior Rehab/Hospitalizations Has the Christina had prior rehab or hospitalizations prior to admission? Yes  Has the Christina had major surgery during 100 days prior to admission? Yes   Current Medications  Current Facility-Administered Medications:  .  (feeding supplement) PROSource Plus liquid 30 mL, 30 mL, Oral, BID BM, Hoffman, Erik C, DO, 30 mL at 05/04/20 1311 .  0.9 %  sodium chloride infusion (Manually program via Guardrails IV Fluids), , Intravenous, Once, Danis, HEstill CottaIII, MD .  0.9 %  sodium chloride infusion, , Intravenous, Continuous, Danis, HEstill CottaIII, MD, Last Rate: 10 mL/hr at 04/24/20 0858, New Bag at 04/24/20 0858 .  acetaminophen (TYLENOL) tablet 1,000 mg, 1,000 mg, Oral, TID, WMaudie Mercury MD, 1,000 mg at 05/05/20 [phone].  apixaban (ELIQUIS) tablet 2.5 mg, 2.5 mg, Oral, Daily, NGaylan Gerold DO .  collagenase (SANTYL) ointment, , Topical, Daily, HLucious Groves DO, Given at 05/05/20 [phone].  DAPTOmycin (CUBICIN) 500 mg in sodium chloride 0.9 % IVPB, 500 mg, Intravenous, Q48H, Dixon, SMelton Krebs NP .  feeding supplement (BOOST / RESOURCE BREEZE) liquid 1 Container, 1  Container, Oral, TID BM, HLucious Groves DO, 1 Container at 05/04/20 1311 .  fluconazole (DIFLUCAN) tablet 100 mg, 100 mg, Oral, Daily, NGaylan Gerold DO, 100 mg at 05/05/20 0843 .  levofloxacin (LEVAQUIN) tablet 500 mg, 500 mg, Oral, Q48H, Dixon, Stephanie N, NP .  lidocaine (XYLOCAINE) 1 % (with pres) injection, , Infiltration, PRN, WCorrie Mckusick DO, 10 mL at 04/30/20 1718 .  magnesium oxide (MAG-OX) tablet 400 mg, 400 mg, Oral, BID, DNelida MeuseIII, MD, 400 mg at 05/05/20 0842 .  methadone (DOLOPHINE) tablet 2.5 mg, 2.5 mg, Oral, Q12H, Danis, HEstill CottaIII, MD, 2.5 mg at 05/05/20 0842 .  metroNIDAZOLE (FLAGYL) tablet 500 mg, 500 mg, Oral, Q8H, Dixon, SMelton Krebs NP .  Muscle Rub CREA, , Topical, PRN, KRiesa Pope MD, Given at 04/26/20 0757 .  ondansetron (ZOFRAN) injection 4 mg, 4 mg, Intravenous, Once, Katsadouros, Vasilios, MD .  ondansetron (ZOFRAN) injection 4 mg, 4 mg, Intravenous, Q6H PRN, WAlexandria Lodge MD .  oxymetazoline (AFRIN) 0.05 % nasal spray 1 spray, 1 spray, Each Nare, BID, Aslam, Sadia, MD, 1 spray at 05/05/20 0844 .  pantoprazole (PROTONIX)  EC tablet 40 mg, 40 mg, Oral, BID AC, Nelida Meuse III, MD, 40 mg at 05/05/20 0843 .  predniSONE (DELTASONE) tablet 5 mg, 5 mg, Oral, Q breakfast, Danis, Estill Cotta III, MD, 5 mg at 05/05/20 0843 .  sodium bicarbonate tablet 1,300 mg, 1,300 mg, Oral, BID, Alexandria Lodge, MD, 1,300 mg at 05/05/20 0841 .  tacrolimus (PROGRAF) capsule 0.5 mg, 0.5 mg, Oral, BID, Danis, Estill Cotta III, MD, 0.5 mg at 05/05/20 0086  Patients Current Diet:  Diet Order            Diet regular Room service appropriate? Yes; Fluid consistency: Thin  Diet effective now                 Precautions / Restrictions Precautions Precautions: Fall Precaution Comments: recent falls Restrictions Weight Bearing Restrictions: No   Has the Christina had 2 or more falls or a fall with injury in the past year? No  Prior Activity Level Community (5-7x/wk):  Active in the community PTA  Prior Functional Level Self Care: Did the Christina need help bathing, dressing, using the toilet or eating? Independent  Indoor Mobility: Did the Christina need assistance with walking from room to room (with or without device)? Independent  Stairs: Did the Christina need assistance with internal or external stairs (with or without device)? Independent  Functional Cognition: Did the Christina need help planning regular tasks such as shopping or remembering to take medications? [place] / Equipment Home Assistive Devices/Equipment: Blood pressure cuff Home Equipment: Walker - 2 wheels,Cane - single point,Transport chair  Prior Device Use: Indicate devices/aids used by the Christina prior to current illness, exacerbation or injury? None of the above  Current Functional Level Cognition  Overall Cognitive Status: Within Functional Limits for tasks assessed Orientation Level: Oriented X4 General Comments: Fearful of falls, agreeable to standing exercises and pre-gait training at bedside; flat affect    Extremity Assessment (includes Sensation/Coordination)  Upper Extremity Assessment: Defer to OT evaluation  Lower Extremity Assessment: Generalized weakness    ADLs  Overall ADL's : Needs assistance/impaired Eating/Feeding: Modified independent,Sitting,Bed level Grooming: Wash/dry face,Wash/dry hands,Min guard,Standing Grooming Details (indicate cue type and reason): intermittent minA for sitting balance when in unsupported sitting Upper Body Bathing: Minimal assistance,Standing Lower Body Bathing: Moderate assistance,Sitting/lateral leans Upper Body Dressing : Min guard,Sitting Lower Body Dressing: Maximal assistance,Sitting/lateral leans Lower Body Dressing Details (indicate cue type and reason): socks Toilet Transfer: Minimal assistance,Stand-pivot,BSC Toilet Transfer Details (indicate cue type and reason): Simulated with stand-pivot  transfer to recliner with Min A +2. Christina not heavily reliant on external assist but +2 for Christina peace of mind. Toileting- Clothing Manipulation and Hygiene: Maximal assistance Functional mobility during ADLs: Minimal assistance General ADL Comments: Min A +2 and use of RW for transfer to recliner. Christina with increased anxiety with thought of ambulation.    Mobility  Overal bed mobility: Needs Assistance Bed Mobility: Rolling,Sidelying to Sit Rolling: Supervision Sidelying to sit: Min assist Supine to sit: Min guard Sit to supine: Min assist General bed mobility comments: Log roll toward R EOB with HOB partially elevated. Increased time/effort to bring trunk upright. Needs BLE assist to return to supine.    Transfers  Overall transfer level: Needs assistance Equipment used: Rolling walker (2 wheeled) Transfers: Sit to/from Stand Sit to Stand: Min assist Stand pivot transfers: Min assist  Lateral/Scoot Transfers: Min assist General transfer comment: from elevated EOB height to RW. Pt with good sequencing/safety awareness but anxious/fearful of falling  Ambulation / Gait / Stairs / Wheelchair Mobility  Ambulation/Gait Ambulation/Gait assistance: Min guard,+2 safety/equipment Gait Distance (Feet): 40 Feet Assistive device: Rolling walker (2 wheeled) Gait Pattern/deviations: Step-through pattern,Decreased stride length,Trunk flexed,Narrow base of support General Gait Details: Slow and guarded with RW for support. Chair follow utilized for safety as pt fatigues quickly. L hip pain and fatigue limiting distance. Gait velocity: decreased Gait velocity interpretation: <1.31 ft/sec, indicative of household ambulator    Posture / Balance Dynamic Sitting Balance Sitting balance - Comments: Able to maintain static sitting balance at EOB with perference to unilateral UE support on bed surface. Balance Overall balance assessment: Needs assistance Sitting-balance support: Feet  supported Sitting balance-Leahy Scale: Fair Sitting balance - Comments: Able to maintain static sitting balance at EOB with perference to unilateral UE support on bed surface. Standing balance support: Bilateral upper extremity supported Standing balance-Leahy Scale: Poor Standing balance comment: Reliant on UE support of RW    Special needs/care consideration Skin: labia wound, ecchymosis on BUEs ; Pt. On IV antibiotics, Contact precautions   Previous Home Environment (from acute therapy documentation) Living Arrangements: Spouse/significant other Available Help at Discharge: Family,Available PRN/intermittently Type of Home: [place]: Two level,Able to live on main level with bedroom/bathroom Alternate Level Stairs-Number of Steps: flight but can stay on main level Home Access: Stairs to enter Entrance Stairs-Number of Steps: 7 STE in front no rails, 3STE in the back with rails but cannot reach both Bathroom Shower/Tub: Tub/shower unit,Walk-in shower (downstairs bathroom is a tub) Biochemist, clinical: [place]: No  Discharge Living Setting Plans for Discharge Living Setting: Christina's home Type of Home at Discharge: House Discharge Home Layout: Two level,Able to live on main level with bedroom/bathroom Alternate Level Stairs-Number of Steps: flight Discharge Home Access: Stairs to enter Entrance Stairs-Rails: [place] of Steps: 4 (back) Discharge Bathroom Shower/Tub: Tub/shower unit Discharge Bathroom Toilet: Standard Discharge Bathroom Accessibility: Yes How Accessible: Accessible via walker Does the Christina have any problems obtaining your medications?: No  Social/Family/Support Systems Christina Roles: Spouse Contact Information: [phone] Anticipated Caregiver: Minahil Quinlivan Anticipated Caregiver's Contact Information: [phone] Ability/Limitations of Caregiver: Works during day, Pt.'s aunt can be with her while he works  Leonie Man [phone]  Goals Christina/Family Goal for Rehab: PT/OT Supervision Expected length of stay: 7-10 days Pt/Family Agrees to Admission and willing to participate: Yes Program Orientation Provided & Reviewed with Pt/Caregiver Including Roles  & Responsibilities: Yes  Decrease burden of Care through IP rehab admission: Specialzed equipment needs, Decrease number of caregivers, Bowel and bladder program and Christina/family education  Possible need for SNF placement upon discharge: Not anticipated  Christina Condition: I have reviewed medical records from [facility], spoken with CSW, and Christina. I met with Christina at the bedside for inpatient rehabilitation assessment.  Christina will benefit from ongoing PT and OT, can actively participate in 3 hours of therapy a day 5 days of the week, and can make measurable gains during the admission.  Christina will also benefit from the coordinated team approach during an Inpatient Acute Rehabilitation admission.  The Christina will receive intensive therapy as well as Rehabilitation physician, nursing, social worker, and care management interventions.  Due to safety, skin/wound care, disease management, medication administration, pain management and Christina education the Christina requires 24 hour a day rehabilitation nursing.  The Christina is currently min-mod A ** with mobility and basic ADLs.  Discharge setting and therapy post discharge at home with home health is anticipated.  Christina  has agreed to participate in the Acute Inpatient Rehabilitation Program and will admit today.  Preadmission Screen Completed By:  Genella Mech, 05/05/2020 11:50 AM ______________________________________________________________________   Discussed status with Dr. Posey Pronto  on 05/05/20 at 28 and received approval for admission today.  Admission Coordinator:  Genella Mech, CCC-SLP, time 930/Date 05/05/20   Assessment/Plan: Diagnosis: Debility  1. Does the need for  close, 24 hr/day Medical supervision in concert with the Christina's rehab needs make it unreasonable for this Christina to be served in a less intensive setting? Yes  2. 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 Due to safety, skin/wound care, disease management, pain management and Christina education, does the Christina require 24 hr/day rehab nursing? Yes 4. Does the Christina require coordinated care of a physician, rehab nurse, PT, OT to address physical and functional deficits in the context of the above medical diagnosis(es)? Yes Addressing deficits in the following areas: balance, endurance, locomotion, strength, transferring, bathing, dressing, toileting and psychosocial support 5. Can the Christina actively participate in an intensive therapy program of at least 3 hrs of therapy 5 days a week? Yes 6. The potential for Christina to make measurable gains while on inpatient rehab is excellent and good 7. Anticipated functional outcomes upon discharge from inpatient rehab: supervision PT, supervision OT, n/a SLP 8. Estimated rehab length of stay to reach the above functional goals is: 7-10 days. 9. Anticipated discharge destination: Home 10. Overall Rehab/Functional Prognosis: good and fair   MD Signature: Delice Lesch, MD, ABPMR

## 2020-04-22 NOTE — Progress Notes (Signed)
Initial Nutrition Assessment  DOCUMENTATION CODES:   Not applicable  INTERVENTION:  Provide Nepro Shake po TID, each supplement provides 425 kcal and 19 grams protein.  Encourage adequate PO intake.   NUTRITION DIAGNOSIS:   Increased nutrient needs related to cancer and cancer related treatments as evidenced by estimated needs.  GOAL:   Patient will meet greater than or equal to 90% of their needs  MONITOR:   PO intake,Supplement acceptance,Skin,Weight trends,Labs,I & O's  REASON FOR ASSESSMENT:   Consult Assessment of nutrition requirement/status,Wound healing  ASSESSMENT:   55 y/o F with a PMHx of Stage IIIC Vulvar Cancer s/p chemoradiation, CKD s/p renal transplant (2001, 2014), HTN, HLD, presents with weakness, n/v, and seizure like activity   Pt unavailable during attempted time of contact. Per MD, pt reports nausea and vomiting has been more persistent initially started towards the end of her radiation treatment. Plans for EGD Saturday. Pt with unintentional weight loss reported. No weight history on record. RD to order nutritional supplements to aid in caloric and protein needs as well as in wound healing. Meal completion 50% at breakfast this morning and able to tolerate it. Unable to complete Nutrition-Focused physical exam at this time.   Labs and medications reviewed.   Diet Order:   Diet Order            Diet NPO time specified  Diet effective ____           Diet renal with fluid restriction Fluid restriction: 1200 mL Fluid; Room service appropriate? Yes; Fluid consistency: Thin  Diet effective now                 EDUCATION NEEDS:   Not appropriate for education at this time  Skin:  Skin Assessment: Skin Integrity Issues: Skin Integrity Issues:: Stage II Stage II: buttocks  Last BM:  1/18  Height:   Ht Readings from Last 1 Encounters:  04/20/20 5\' 5"  (1.651 m)    Weight:   Wt Readings from Last 1 Encounters:  04/20/20 51.6 kg    Ideal  Body Weight:  56.8 kg  BMI:  Body mass index is 18.93 kg/m.  Estimated Nutritional Needs:   Kcal:  1750-2000  Protein:  85-100 grams  Fluid:  1.2 L/day  Corrin Parker, MS, RD, LDN RD pager number/after hours weekend pager number on Amion.

## 2020-04-22 NOTE — TOC Initial Note (Addendum)
Transition of Care [facility]) - Initial/Assessment Note    Patient Details  Name: Christina Mack MRN: [id number] Date of Birth: 06-20-1965  [facility] CM/SW Contact:    Emeterio Reeve, [place] Phone Number: 04/22/2020, 10:00 AM  Clinical Narrative:                 CSW spoke to pt at bedside. Pt reports she she lives at home with her husband. Pt reports shes report that for the past few weeks she has been weak and is only walking short distances. Pt reports that other have been cooking for her and completes ADL's with little to no assistance. Pt is covid vaccinated.   CSW reviewed pt/ot reccs of snf. Pt reports she does not feel she is SNF appropriate and would prefer to go to CIR. CSW informed pt and MD.   Expected Discharge Plan: [place] Barriers to Discharge: Insurance Authorization,Continued Medical Work up   Patient Goals and CMS Choice Patient states their goals for this hospitalization and ongoing recovery are:: go to CIR then return home CMS Medicare.gov Compare Post Acute Care list provided to:: Patient Choice offered to / list presented to : Patient  Expected Discharge Plan and Services Expected Discharge Plan: [place]       Living arrangements for the past 2 months: Single Family Home                                      Prior Living Arrangements/Services Living arrangements for the past 2 months: Single Family Home Lives with:: Spouse Patient language and need for interpreter reviewed:: Yes Do you feel safe going back to the place where you live?: Yes      Need for Family Participation in Patient Care: Yes (Comment) Care giver support system in place?: Yes (comment)   Criminal Activity/Legal Involvement Pertinent to Current Situation/Hospitalization: No - Comment as needed  Activities of Daily Living Home Assistive Devices/Equipment: Blood pressure cuff ADL Screening (condition at time of admission) Patient's  cognitive ability adequate to safely complete daily activities?: Yes Is the patient deaf or have difficulty hearing?: No Does the patient have difficulty seeing, even when wearing glasses/contacts?: No Does the patient have difficulty concentrating, remembering, or making decisions?: No Patient able to express need for assistance with ADLs?: Yes Does the patient have difficulty dressing or bathing?: Yes Independently performs ADLs?: Yes (appropriate for developmental age) Does the patient have difficulty walking or climbing stairs?: Yes Weakness of Legs: Both Weakness of Arms/Hands: None  Permission Sought/Granted   Permission granted to share information with : Yes, Verbal Permission Granted     Permission granted to share info w AGENCY: SNF        Emotional Assessment Appearance:: Appears stated age Attitude/Demeanor/Rapport: Engaged Affect (typically observed): Appropriate Orientation: : Oriented to Situation,Oriented to  Time,Oriented to Place,Oriented to Self Alcohol / Substance Use: Not Applicable Psych Involvement: No (comment)  Admission diagnosis:  Weakness [R53.1] Nausea and vomiting, intractability of vomiting not specified, unspecified vomiting type [R11.2] Patient Active Problem List   Diagnosis Date Noted  . Pressure injury of skin 04/21/2020  . Weakness 04/20/2020   PCP:  Bonnita Nasuti, MD Pharmacy:   [facility] [place], [place] [address] Phone: [phone] Fax: [phone]  Social Determinants of Health (SDOH) Interventions    Readmission Risk Interventions No flowsheet data found.  Emeterio Reeve, Latanya Presser, [place] Social Worker [phone]

## 2020-04-22 NOTE — Progress Notes (Addendum)
HD#2 Subjective:   No acute events overnight. Husband was able to stay until 8 pm.  Evaluated at bedside during rounds. Patient states she tolerated eating dinner last night and breakfast this morning. She denies difficulty swallowing or feeling like food ever gets stuck. Notes that when she eats bread, "when she chews it it feels like it's getting bigger." Endorses some reflux after certain foods such as spicy or acidic foods, red wine. Does not feel like she ever gets food stuck in her throat.   Patient states she would prefer to continue following with Amy for her wound instead of also involving gyn/onc while here.   Did not use a walker or cane prior to development of the wound. States she would prefer a skilled nursing facility over inpatient rehab.   Objective:  Vital signs in last 24 hours: Vitals:   04/21/20 1220 04/21/20 1833 04/21/20 2336 04/22/20 0514  BP: 130/89 125/88 (!) 127/91 (!) 129/96  Pulse: 96 98 93 88  Resp: 16 16 18 18   Temp: 98.7 F (37.1 C) 98.4 F (36.9 C) 98.8 F (37.1 C) 98.7 F (37.1 C)  TempSrc: Oral Oral Oral Oral  SpO2: 98% 100% 99% 98%  Weight:      Height:       Supplemental O2: Room Air SpO2: 98 %  Physical Exam Constitutional:      General: She is not in acute distress.    Appearance: She is ill-appearing.     Comments: Cachectic   Cardiovascular:     Rate and Rhythm: Normal rate and regular rhythm.     Heart sounds: Normal heart sounds. No murmur heard.   Pulmonary:     Effort: Pulmonary effort is normal.  Musculoskeletal:     Right lower leg: No edema.     Left lower leg: No edema.  Skin:    General: Skin is warm and dry.     Findings: Bruising present.  Neurological:     Mental Status: She is oriented to person, place, and time.  Psychiatric:        Mood and Affect: Mood normal.        Behavior: Behavior normal.     Filed Weights   04/20/20 1147 04/20/20 2216  Weight: 54 kg 51.6 kg    No intake or output data in  the 24 hours ending 04/22/20 0637 Net IO Since Admission: 240 mL [04/22/20 0637]  Pertinent Labs: CBC Latest Ref Rng & Units 04/22/2020 04/21/2020 04/20/2020  WBC 4.0 - 10.5 K/uL 8.1 8.7 9.6  Hemoglobin 12.0 - 15.0 g/dL 7.9(L) 8.5(L) 8.4(L)  Hematocrit 36.0 - 46.0 % 24.6(L) 27.3(L) 26.6(L)  Platelets 150 - 400 K/uL 106(L) 105(L) 102(L)    CMP Latest Ref Rng & Units 04/22/2020 04/21/2020 04/20/2020  Glucose 70 - 99 mg/dL 107(H) 89 99  BUN 6 - 20 mg/dL 58(H) 57(H) 59(H)  Creatinine 0.44 - 1.00 mg/dL 3.43(H) 3.44(H) 3.33(H)  Sodium 135 - 145 mmol/L 132(L) 131(L) 133(L)  Potassium 3.5 - 5.1 mmol/L 3.9 3.9 3.9  Chloride 98 - 111 mmol/L 101 101 99  CO2 22 - 32 mmol/L 23 21(L) 21(L)  Calcium 8.9 - 10.3 mg/dL 8.1(L) 7.9(L) 8.0(L)  Total Protein 6.5 - 8.1 g/dL - 5.5(L) 5.2(L)  Total Bilirubin 0.3 - 1.2 mg/dL - 0.9 1.0  Alkaline Phos 38 - 126 U/L - 103 110  AST 15 - 41 U/L - 14(L) 18  ALT 0 - 44 U/L - 12 10  Imaging: No results found.  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   Generalized Weakness Multifactorial etiology; deconditioning, malnutrition, recent 2 week hospitalization, severe right labial wound and hx of cancer. Patient notes ability to consume her dinner last night and breakfast this morning without vomiting. Evaluated by PT/OT who recommended CIR vs SNF. Patient would like to be evaluated by CIR. Nutrition consult pending. -Daily BMP -Mg today is 1.9.  -continue to work with PT/OT -B12, TSH are nl -HIV was Negative  Nausea/Vomiting Patient notes improvement of her nausea/vomiting, able to tolerate her evening meal as well as breakfast without  vomiting episodes. QTc much improved this am, 432. Because of her persistent n/v and complex medical history, will consult GI. Of note, patient with multiple blood transfusions for anemia over recent hospitalizations.  -consult GI, appreciate their recommendations -Continue to monitor -Renal diet -QTc normal at this time, if nauseated will add anti-emetic   Right Labial Soft Tissue Infection Patient remains afebrile, hemodynamically stable, and without leukocytosis. No signs/symptoms of worsening infection. Appreciate wound care recommendations, RN with question if gen surg or gyn/onc should be consulted as nurse felt as though patient's wound was outside of her scope of practice. Patient evaluated by both specialties during prior admission and recommended continue abx course. Do not see any benefit to consulted either at this time. Addressed this with the patient and she agrees with plan.  -Follow wound care recommendations -Continue abx of levofloxacin and metronidazole, end date of 01/22  Kidney Failure s/p kidney transplant 2001 and 2014 Patient being followed by Dr. Joelyn Oms. He discussed with her possibility of dialysis, however, he does not feel as though patient needs dialysis at this current time. Recommended palliative care and patient agreed to have them consulted.  -Dr. Joelyn Oms to continue to follow, appreciate his recommendations  Stage 3C Vulvar Cancer Patient completed treatment at this time. Follows with gyn/onc at [facility]. Evaluated by them at prior admission who recommended outpatient PET -follow up with gyn/onc on outpatient basis  Extremity Bruises  Patient notes bruises appeared after one of the antibiotics given caused her platelets to drop during prior hospitalization. Patient initially prescribed vanc and zosyn at that time and both were discontinued. She is also on prednisone at home that could be contributing. -Continue to monitor -Daily cbc  Normocytic  Anemia Patient with Hgb of 7.9 from 8.5. Has had multiple RBC transfusions over the past few months. Of note, she received ferrous sulfate during prior hospitalization at [facility] for iron deficiency anemia. Iron of 43. Ferritin of 2188. Lab unable to calculate rest of iron studies in setting of elevated transferrin levels. Suspect elevated ferritin in setting of acute phase reactant for recent right labial wound. -daily cbc -transfuse if under 7  Seizure-like Activity  No other seizure activity noted since admission. Suspect metabolic in etiology. Will continue to monitor and nephro following and will determine, along with the patient, if dialysis is needed.  -daily bmp  Diet: Renal IVF: None,None VTE: Eliquis Code: Full  Dispo: Anticipated discharge pending clinical improvement and work up [facility] Resident PGY-1 Pager [phone] Please contact the on  call pager after 5 pm and on weekends at [phone].

## 2020-04-22 NOTE — Consult Note (Signed)
Consultation Note Date: 04/22/2020   Patient Name: Christina Mack  DOB: [birth year]  MRN: [id number]  Age / Sex: 55 y.o., female  PCP: Bonnita Nasuti, MD Referring Physician: Lucious Groves, DO  Reason for Consultation: Establishing goals of care and Psychosocial/spiritual support  HPI/Patient Profile: 55 y.o. female  admitted on 04/20/2020 with  PMHx ofStage IIIC Vulvar Cancer s/p chemoradiation, CKD s/p renal transplant (2001, 2014),HTN, HLD,Femoral VeinDVT on Eliquispresented to the [facility] ED with weakness, n/v, and seizure like activity admitted for further work up.  On admission patient was found to have wound on right labia and inner thigh, elevated WBCs, elevated lactate and worsening renal function.  Patient had a recent 2-week hospitalization at [place] at the beginning of January 2022  Patient's oncologist is Dr. Juleen China at [facility] patient is vulvar cancer was diagnosed in June 2022, she is status post chemo and radiation.  Her tentative plan was a follow-up PET scan in the next few weeks to explore furtehr treatment options      Unfortunately the patient has continued to have physical and functional decline.  Patient and her family face treatment option decisions, advanced directive decisions, and anticipatory care needs.  Clinical Assessment and Goals of Care:   This NP Wadie Lessen reviewed medical records, received report from team, assessed the patient and then meet at the patient's bedside along with her daughter/Christina Mack to discuss diagnosis, prognosis, GOC, EOL wishes disposition and options.   Concept of Palliative Care was introduced as specialized medical care for people and their families living with serious illness.  It focuses on providing relief from the symptoms and stress of a serious illness.  The goal is to improve quality of life for both the patient and the  family.  Values and goals of care important to patient and family were attempted to be elicited.  Created space and opportunity for patient  and family to explore thoughts and feelings regarding current medical situation.  Patient speaks to this difficult situation having only been diagnosed with the vulval cancer in June 2021.  She is hopeful for continued treatment options to prolong quality of life.  Patient speaks to her familiarity with healthcare patient  is a PTA and her daughter is a Recruitment consultant.   A  discussion was had today regarding advanced directives.  Concepts specific to code status, artifical feeding and hydration, continued IV antibiotics and rehospitalization was had.        Questions and concerns addressed.  Patient  encouraged to call with questions or concerns.     PMT will continue to support holistically.   No documented healthcare power of attorney or advanced care planning document.  Patient tells me that her husband and her only daughter Christina Mack would be her decision makers in the event that   MOST form introduced.    Discussed with patient the importance of continued conversation with her  family and the medical providers regarding overall plan of care and treatment options,  ensuring decisions are  within the context of the patients values and GOCs.        SUMMARY OF RECOMMENDATIONS    Code Status/Advance Care Planning:  Full code  Encouraged patient/family to consider DNR/DNI status understanding evidenced based poor outcomes in similar hospitalized patient, as the cause of arrest is likely associated with advanced chronic illness rather than an easily reversible acute cardio-pulmonary event.    Palliative Prophylaxis:   Frequent Pain Assessment  Additional Recommendations (Limitations, Scope, Preferences):  Full Scope Treatment  Psycho-social/Spiritual:   Desire for further Chaplaincy support:no-declined  Additional Recommendations:  Emotional support offered  Prognosis:   Unable to determine  Discharge Planning: To Be Determined      Primary Diagnoses: Present on Admission: **None**   I have reviewed the medical record, interviewed the patient and family, and examined the patient. The following aspects are pertinent.  History reviewed. No pertinent past medical history. Social History   Socioeconomic History   Marital status: Married    Spouse name: Not on file   Number of children: Not on file   Years of education: Not on file   Highest education level: Not on file  Occupational History   Not on file  Tobacco Use   Smoking status: Never Smoker   Smokeless tobacco: Never Used  Substance and Sexual Activity   Alcohol use: Not on file   Drug use: Not on file   Sexual activity: Not on file  Other Topics Concern   Not on file  Social History Narrative   Not on file   Social Determinants of Health   Financial Resource Strain: Not on file  Food Insecurity: Not on file  Transportation Needs: Not on file  Physical Activity: Not on file  Stress: Not on file  Social Connections: Not on file   History reviewed. No pertinent family history. Scheduled Meds:  feeding supplement (NEPRO CARB STEADY)  237 mL Oral TID BM   levofloxacin  500 mg Oral QODAY   magnesium oxide  400 mg Oral BID   methadone  2.5 mg Oral Q12H   metroNIDAZOLE  500 mg Oral TID   ondansetron (ZOFRAN) IV  4 mg Intravenous Once   pantoprazole (PROTONIX) IV  40 mg Intravenous Q12H   predniSONE  5 mg Oral Q breakfast   tacrolimus  0.5 mg Oral BID   Continuous Infusions:  heparin     lactated ringers     PRN Meds:. Medications Prior to Admission:  Prior to Admission medications   Medication Sig Start Date End Date Taking? Authorizing Provider  apixaban (ELIQUIS) 2.5 MG TABS tablet Take 2.5 mg by mouth 2 (two) times daily.   Yes [provider]  ferrous sulfate 325 (65 FE) MG tablet Take 325 mg  by mouth 2 (two) times daily with a meal.   Yes [provider]  levofloxacin (LEVAQUIN) 500 MG tablet Take 500 mg by mouth every other day. For 8 days   Yes [provider]  loperamide (IMODIUM) 2 MG capsule Take 2 mg by mouth 4 (four) times daily as needed for diarrhea or loose stools.   Yes [provider]  magnesium oxide (MAG-OX) 400 MG tablet Take 400 mg by mouth 2 (two) times daily.   Yes [provider]  methadone (DOLOPHINE) 5 MG tablet Take 2.5 mg by mouth every 12 (twelve) hours.   Yes [provider]  metroNIDAZOLE (FLAGYL) 500 MG tablet Take 500 mg by mouth 3 (three) times daily. For 7 days  Yes [provider]  ondansetron (ZOFRAN) 8 MG tablet Take 8 mg by mouth every 8 (eight) hours as needed for nausea or vomiting.   Yes [provider]  predniSONE (DELTASONE) 5 MG tablet Take 5 mg by mouth daily with breakfast.   Yes [provider]  tacrolimus (PROGRAF) 0.5 MG capsule Take 0.5 mg by mouth daily.   Yes [provider]  valACYclovir (VALTREX) 500 MG tablet Take 500 mg by mouth 2 (two) times daily.   Yes [provider]   Allergies  Allergen Reactions   Codeine     vomiting   Hydromorphone Nausea And Vomiting   Oxycodone-Acetaminophen Itching, Nausea Only, Other (See Comments) and Nausea And Vomiting    Other Reaction: HA   Review of Systems  Physical Exam  Vital Signs: BP (!) 128/91 (BP Location: Right Arm)    Pulse 90    Temp 98.5 F (36.9 C) (Oral)    Resp 18    Ht 5\' 5"  (1.651 m)    Wt 51.6 kg    SpO2 99%    BMI 18.93 kg/m  Pain Scale: 0-10   Pain Score: 0-No pain   SpO2: SpO2: 99 % O2 Device:SpO2: 99 % O2 Flow Rate: .   IO: Intake/output summary:   Intake/Output Summary (Last 24 hours) at 04/22/2020 1609 Last data filed at 04/22/2020 0815 Gross per 24 hour  Intake 240 ml  Output --  Net 240 ml    LBM: Last BM Date: 04/20/20 Baseline Weight: Weight: 54 kg Most  recent weight: Weight: 51.6 kg     Palliative Assessment/Data: 30 % at best   Discussed with Dr Heber Leeton  Sela Hua NP and bedside RN   This nurse practitioner informed  the patient/family and the attending that I will be out of the hospital until Monday morning.   I will follow-up at that time.  Call palliative medicine team phone # [phone] with questions or concerns in the interim   Time In: 1000 Time Out: 1115 Time Total: 75 minutes Greater than 50%  of this time was spent counseling and coordinating care related to the above assessment and plan.  Signed by: Wadie Lessen, NP   Please contact Palliative Medicine Team phone at [phone] for questions and concerns.  For individual provider: See Shea Evans

## 2020-04-22 NOTE — H&P (View-Only) (Signed)
Christina Mack: 11:31 AM 04/22/2020  LOS: 2 days    Referring Provider: Dr Heber St. Marks of [facility].   Primary Care Physician:  Bonnita Nasuti, MD in [place] Primary Gastroenterologist:  Althia Forts.   Oncologist:  Clenton Pare MD Renal MD:  [facility] in [place].      Reason for Consultation: Nausea, vomiting   HPI: Christina Mack is a 55 y.o. female.  ESRD.  Renal transplant 2001, additional renal transplant 2014 with subsequent CKD.  DVT 12/2019 on Eliquis.  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  09/2019 PET scan showed large right perineal hypermetabolic mass, hypermetabolic right groin lymphadenopathy c/w consistent with known vulvar cancer and nodal involvement.  Started chemoradiation in 12/8262 complicated and subsequently stopped chemo due 12/2019 DVT and AKI.  Due to development of purulent, foul-smelling discharge, deep vulvar wound, Dr. Juleen China recommended exam under anesthesia, not yet performed.  Requiring methadone for vulvar pain control.  Completed radiation 01/15/2020.  IDA.  Received transfusion PRBCs in August, September and during recent [facility] hospital admission.  Thrombocytopenia.  Admitted to [facility] 1/2 -04/17/2020 with progressive weakness and general functional decline, pain in deep wound of R labia/upper inner thigh, oozing foul-smelling material, meeting sepsis criteria.   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  Diffuse pelvic mesentery edema and presacral fluid concerning for mesenteritis.  Suspected SB/colonic ileus vs gastroenteritis.  Material in GB suggesting sludge/possible  stones.  Hepatosplenomegaly.  Suspected anemia.  Changes suggesting chronic urinary bladder cystitis.   General surgery and GYN/oncology did not endorse diagnosis of necrotizing fasciitis, recommended IV antibiotic.  ID involved.  Discharged on po levaquin, po Flagyl stop dates 04/25/2020, 1/22 respectively.  GYN oncology recommended outpatient PET scan in a few weeks after the antibiotics had finished.  Nephrology consulted for AKI on stage V CKD and recommended holding CellCept and reducing dose Prograf.  Hemodialysis entertained but was not required.  Diarrhea with stool testing negative for C. difficile.  Nausea and vomiting treated with Pepcid which was helpful and Zofran.  Received 3 to 4 PRBCs during admission w nadir Hgb 6.8.   Additional discharge meds included Methadone bid, prednisone 5 mg/day, iron sulfate 325 bid, Eliquis bid.   Pt describes onset of nausea and vomiting in late September/early October towards the end of her daily radiation treatments.  Initially would be triggered by smells of food and odor remains something of a trigger.  She was prescribed Zofran and Phenergan and took only the Phenergan which would help.  Never prescribed any acid suppressing medications.  Has never been endoscoped.  N/V persist.  Fairly frequent but unpredictable occurrences.  Some days she can drink liquids without problems or eat solid food.  Other times she vomits both liquids and solids.  Lately the nausea and vomiting has gotten more persistent.  No coffee grounds or bloody emesis and often may just have dry  heaves.  During the stay at [facility] she received a dose of famotidine which really helped the nausea.  However at discharge no acid suppressing meds were prescribed.  Since discharge unable to eat or drink without vomiting and has persistent severe weakness.  No dysphagia.  Yesterday, she planned to drive to [facility] where she has nephrology appointment later this month, in hopes they would see her  in the emergency room sooner.  However because of the profound weakness EMS was called and transported her to [facility].  EMS noted patient unable to control limb flailing and shaking.  Patient had AMS, was not able to answer any questions lucidly but the next thing she remembers is waking up in the [facility] emergency department.  Family reports a recent fall with head trauma that had not been evaluated. Heart rate 137, BP 155/97, respiratory rate 29, oxygen sats 100% at arrival. Hgb 8.4 >> 7.9 (6.8 - 9.1 during recent [facility] admission), MCV normal.  Platelets 102 (44 -82 during recent [facility] admission) .  No leukocytosis.  BUN/creatinine 59/3.3.  Hyponatremia. Hypomagnesemia, hypocalcemia.  Other than low albumin at 2.1, LFTs at or below normal.  Iron 43, ferritin 2188.  TIBC, iron sats not calculated.  B12 and folate levels WNL.  TSH normal Negative head CT.     Prior to Admission medications   Medication Sig Start Date End Date Taking? Authorizing Provider  apixaban (ELIQUIS) 2.5 MG TABS tablet Take 2.5 mg by mouth 2 (two) times daily.   Yes [provider]  ferrous sulfate 325 (65 FE) MG tablet Take 325 mg by mouth 2 (two) times daily with a meal.   Yes [provider]  levofloxacin (LEVAQUIN) 500 MG tablet Take 500 mg by mouth every other day. For 8 days   Yes [provider]  loperamide (IMODIUM) 2 MG capsule Take 2 mg by mouth 4 (four) times daily as needed for diarrhea or loose stools.   Yes [provider]  magnesium oxide (MAG-OX) 400 MG tablet Take 400 mg by mouth 2 (two) times daily.   Yes [provider]  methadone (DOLOPHINE) 5 MG tablet Take 2.5 mg by mouth every 12 (twelve) hours.   Yes [provider]  metroNIDAZOLE (FLAGYL) 500 MG tablet Take 500 mg by mouth 3 (three) times daily. For 7 days   Yes [provider]  ondansetron (ZOFRAN) 8 MG tablet Take 8 mg by mouth every 8 (eight) hours as needed for nausea or vomiting.    Yes [provider]  predniSONE (DELTASONE) 5 MG tablet Take 5 mg by mouth daily with breakfast.   Yes [provider]  tacrolimus (PROGRAF) 0.5 MG capsule Take 0.5 mg by mouth daily.   Yes [provider]  valACYclovir (VALTREX) 500 MG tablet Take 500 mg by mouth 2 (two) times daily.   Yes [provider]    Scheduled Meds: . apixaban  2.5 mg Oral BID  . levofloxacin  500 mg Oral QODAY  . magnesium oxide  400 mg Oral BID  . methadone  2.5 mg Oral Q12H  . metroNIDAZOLE  500 mg Oral TID  . ondansetron (ZOFRAN) IV  4 mg Intravenous Once  . predniSONE  5 mg Oral Q breakfast  . tacrolimus  0.5 mg Oral Daily   Infusions: . lactated ringers     PRN Meds:    Allergies as of 04/20/2020 - Review Complete 04/20/2020  Allergen Reaction Noted  . Codeine  04/20/2020  .  Hydromorphone Nausea And Vomiting 11/22/2016  . Oxycodone-acetaminophen Itching, Nausea Only, Other (See Comments), and Nausea And Vomiting 04/20/2020    History reviewed. No pertinent family history.  Social History   Socioeconomic History  . Marital status: Married    Spouse name: Not on file  . Number of children: Not on file  . Years of education: Not on file  . Highest education level: Not on file  Occupational History  . Not on file  Tobacco Use  . Smoking status: Never Smoker  . Smokeless tobacco: Never Used  Substance and Sexual Activity  . Alcohol use: Not on file  . Drug use: Not on file  . Sexual activity: Not on file  Other Topics Concern  . Not on file  Social History Narrative  . Not on file   Social Determinants of Health   Financial Resource Strain: Not on file  Food Insecurity: Not on file  Transportation Needs: Not on file  Physical Activity: Not on file  Stress: Not on file  Social Connections: Not on file  Intimate Partner Violence: Not on file    REVIEW OF SYSTEMS: Constitutional: Weakness.  50 # weight loss within the last year ENT:  No  nose bleeds Pulm: No shortness of breath or cough CV:  No palpitations, no LE edema.  GU:  No hematuria, no frequency.  Urine very concentrated and deep colored.  Has difficulty urinating unless she is sitting on the toilet so bedpan makes it difficult to urinate.  No burning on urination GI: See HPI Heme: Had a lot of bruising and purpura develop during recent hospitalization in the setting of thrombocytopenia.  However denies unusual or excessive bleeding from any parts of the body or cavities. Transfusions: See HPI Neuro:  No headaches, no peripheral tingling or numbness.  No syncope, no seizures Derm:  No itching, no rash or sores.  Endocrine:  No sweats or chills.  No polyuria or dysuria Immunization: Not queried Travel:  None beyond local counties in last few months.    PHYSICAL EXAM: Vital signs in last 24 hours: Vitals:   04/21/20 2336 04/22/20 0514  BP: (!) 127/91 (!) 129/96  Pulse: 93 88  Resp: 18 18  Temp: 98.8 F (37.1 C) 98.7 F (37.1 C)  SpO2: 99% 98%   Wt Readings from Last 3 Encounters:  04/20/20 51.6 kg    General: Thin, malnourished, chronically ill and tired appearing, looks somewhat older than stated age. Head: No facial asymmetry or swelling.  Facial muscle wasting Eyes: No scleral icterus or conjunctival pallor.  EOMI Ears: No hearing deficit Nose: No congestion, no discharge Mouth: Fair dentition, mucosa is moist, pink, clear.  Tongue midline. Neck: No masses, thyromegaly, JVD. Lungs: Clear bilaterally.  No labored breathing, no cough. Heart: RRR.  No MRG.  S1, S2 present Abdomen: Soft.  No tenderness.  No HSM appreciated, no masses appreciated.  Bowel sounds active..   Rectal: Deferred Musc/Skeltl: No joint redness, swelling or gross deformity.  Osteopenic appearance. Extremities: No CCE.  Did not closely examine the vulvar wound or top of the right thigh which are not grossly evident when she is laying to on bed with her legs approximated next to  each other. Neurologic: Alert and oriented x3.  Moves all 4 limbs without gross weakness but strength not tested.  No tremors. Skin: Purpura on forearms bilaterally, across the upper sternum below the neck and on both shins. Nodes: No cervical adenopathy Psych: Somewhat flat affect but pleasant,  cooperative and fluid speech.  Intake/Output from previous day: No intake/output data recorded. Intake/Output this shift: Total I/O In: 240 [P.O.:240] Out: -   LAB RESULTS: Recent Labs    04/20/20 1255 04/21/20 0034 04/22/20 0244  WBC 9.6 8.7 8.1  HGB 8.4* 8.5* 7.9*  HCT 26.6* 27.3* 24.6*  PLT 102* 105* 106*   BMET Lab Results  Component Value Date   NA 132 (L) 04/22/2020   NA 131 (L) 04/21/2020   NA 133 (L) 04/20/2020   K 3.9 04/22/2020   K 3.9 04/21/2020   K 3.9 04/20/2020   CL 101 04/22/2020   CL 101 04/21/2020   CL 99 04/20/2020   CO2 23 04/22/2020   CO2 21 (L) 04/21/2020   CO2 21 (L) 04/20/2020   GLUCOSE 107 (H) 04/22/2020   GLUCOSE 89 04/21/2020   GLUCOSE 99 04/20/2020   BUN 58 (H) 04/22/2020   BUN 57 (H) 04/21/2020   BUN 59 (H) 04/20/2020   CREATININE 3.43 (H) 04/22/2020   CREATININE 3.44 (H) 04/21/2020   CREATININE 3.33 (H) 04/20/2020   CALCIUM 8.1 (L) 04/22/2020   CALCIUM 7.9 (L) 04/21/2020   CALCIUM 8.0 (L) 04/20/2020   LFT Recent Labs    04/20/20 1255 04/21/20 0034  PROT 5.2* 5.5*  ALBUMIN 2.1* 2.1*  AST 18 14*  ALT 10 12  ALKPHOS 110 103  BILITOT 1.0 0.9   PT/INR No results found for: INR, PROTIME Hepatitis Panel No results for input(s): HEPBSAG, HCVAB, HEPAIGM, HEPBIGM in the last 72 hours. C-Diff No components found for: CDIFF Lipase  No results found for: LIPASE  Drugs of Abuse  No results found for: LABOPIA, COCAINSCRNUR, LABBENZ, AMPHETMU, THCU, LABBARB   RADIOLOGY STUDIES: CT Head Wo Contrast  Result Date: 04/20/2020 CLINICAL DATA:  Head trauma, altered mental status. Seizure, syncope. EXAM: CT HEAD WITHOUT CONTRAST TECHNIQUE:  Contiguous axial images were obtained from the base of the skull through the vertex without intravenous contrast. COMPARISON:  No pertinent prior exams available for comparison. FINDINGS: Brain: Cerebral volume is normal for age. Tiny chronic left thalamic lacunar infarct (series 3, image 16). There is no acute intracranial hemorrhage. No demarcated cortical infarct. No extra-axial fluid collection. No evidence of intracranial mass. No midline shift. Vascular: No hyperdense vessel.  Atherosclerotic calcifications Skull: Normal. Negative for fracture or focal lesion. Sinuses/Orbits: Visualized orbits show no acute finding. Minimal scattered paranasal sinus mucosal thickening at the imaged levels. Other: Trace fluid within the left mastoid air cells. IMPRESSION: No evidence of acute intracranial abnormality. Tiny chronic left thalamic lacunar infarct. Minimal paranasal sinus mucosal thickening at the imaged levels. Trace fluid within the left mastoid air cells. Electronically Signed   By: Kellie Simmering DO   On: 04/20/2020 14:13   DG Chest Port 1 View  Result Date: 04/20/2020 CLINICAL DATA:  Seizure EXAM: PORTABLE CHEST 1 VIEW COMPARISON:  Portable exam 1251 hours compared to 09/24/2006 FINDINGS: Normal heart size, mediastinal contours, and pulmonary vascularity. Lungs mildly hyperinflated but clear. No infiltrate, pleural effusion or pneumothorax. Osseous structures unremarkable. IMPRESSION: No acute abnormalities. Electronically Signed   By: Lavonia Dana M.D.   On: 04/20/2020 12:58      IMPRESSION:   *   Nausea, vomiting x several months.  Started near end of rads Rx in late 12/2019 but persists.  Not on PPI or H2B meds.   CT 3 weeks ago showed possible small bowel/colonic ileus versus gastroenteritis, changes concerning for mesenteritis On bid Methadone which could add an element  of gastroparesis.  *    Chronic, normocytic anemia.  Multiple outpatient PRBCs in August and September 2021 and again during  [place] admission starting 3 weeks ago.  No overt GI bleeding.  No FOBT testing.    *    Chronic thrombocytopenia.  Platelets improved from low of 44 on 04/12/20.   Hepatosplenomegaly per CTAP of 04/03/2020.  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  *    Hyponatremia.  *    Late stage CKD.  Not yet requiring hemodialysis.  Renal transplants 2010, 2014  *   12/2019 DVT.  On chronic Eliquis, last dose: this AM.      PLAN:     *   EGD. Pt agreeable. Saturday AM. TS starting heparin now, will hold x 6 h before procedure.    *    Initiated Protonix 40 mg IV bid.     Azucena Freed  04/22/2020, 11:31 AM Phone [phone]   I have reviewed the entire case in detail with the above APP and discussed the plan in detail.  Therefore, I agree with the diagnoses recorded above. In addition,  I have personally interviewed and examined the patient and have personally reviewed any abdominal/pelvic CT scan images.  My additional thoughts are as follows:  Unfortunate woman with multiple complex issues.  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  There may be additional upper GI mucosal disease as well, so upper endoscopy is warranted given the duration and severity of her symptoms.  She received a dose of oral anticoagulation today, so our plan is for an upper endoscopy the day after tomorrow.  She was agreeable after discussion of procedure and risks.  The benefits and risks of the planned procedure were described in detail with the patient or (when appropriate) their health care proxy.  Risks were outlined as including, but not limited to, bleeding, infection, perforation, adverse  medication reaction leading to cardiac or pulmonary decompensation, pancreatitis (if ERCP).  The limitation of incomplete mucosal visualization was also discussed.  No guarantees or warranties were given.  Patient at increased risk for cardiopulmonary complications of procedure due to medical comorbidities.  She will be on IV heparin, which we will hold shortly before the procedure.  If no discrete treatable cause found on upper endoscopy, symptomatic treatment with antiemetics will be warranted.  Nelida Meuse III Office:[phone]

## 2020-04-22 NOTE — Consult Note (Addendum)
[facility] Consult: 11:31 AM 04/22/2020  LOS: 2 days    Referring Provider: Dr Heber [place] [facility].   Primary Care Physician:  Bonnita Nasuti, MD in [place] Primary Gastroenterologist:  Althia Forts.   Oncologist:  Clenton Pare MD Renal MD:  [facility] in [place].      Reason for Consultation: Nausea, vomiting   HPI: Christina Mack is a 55 y.o. female.  ESRD.  Renal transplant 2001, additional renal transplant 2014 with subsequent CKD.  DVT 12/2019 on Eliquis.  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  09/2019 PET scan showed large right perineal hypermetabolic mass, hypermetabolic right groin lymphadenopathy c/w consistent with known vulvar cancer and nodal involvement.  Started chemoradiation in 04/6107 complicated and subsequently stopped chemo due 12/2019 DVT and AKI.  Due to development of purulent, foul-smelling discharge, deep vulvar wound, Dr. Juleen China recommended exam under anesthesia, not yet performed.  Requiring methadone for vulvar pain control.  Completed radiation 01/15/2020.  IDA.  Received transfusion PRBCs in August, September and during recent [facility] hospital admission.  Thrombocytopenia.  Admitted to [facility] 1/2 -04/17/2020 with progressive weakness and general functional decline, pain in deep wound of R labia/upper inner thigh, oozing foul-smelling material, meeting sepsis criteria.   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  Diffuse pelvic mesentery edema and presacral fluid concerning for mesenteritis.  Suspected SB/colonic ileus vs gastroenteritis.  Material in GB suggesting sludge/possible  stones.  Hepatosplenomegaly.  Suspected anemia.  Changes suggesting chronic urinary bladder cystitis.   General surgery and GYN/oncology did not endorse diagnosis of necrotizing fasciitis, recommended IV antibiotic.  ID involved.  Discharged on po levaquin, po Flagyl stop dates 04/25/2020, 1/22 respectively.  GYN oncology recommended outpatient PET scan in a few weeks after the antibiotics had finished.  Nephrology consulted for AKI on stage V CKD and recommended holding CellCept and reducing dose Prograf.  Hemodialysis entertained but was not required.  Diarrhea with stool testing negative for C. difficile.  Nausea and vomiting treated with Pepcid which was helpful and Zofran.  Received 3 to 4 PRBCs during admission w nadir Hgb 6.8.   Additional discharge meds included Methadone bid, prednisone 5 mg/day, iron sulfate 325 bid, Eliquis bid.   Pt describes onset of nausea and vomiting in late September/early October towards the end of her daily radiation treatments.  Initially would be triggered by smells of food and odor remains something of a trigger.  She was prescribed Zofran and Phenergan and took only the Phenergan which would help.  Never prescribed any acid suppressing medications.  Has never been endoscoped.  N/V persist.  Fairly frequent but unpredictable occurrences.  Some days she can drink liquids without problems or eat solid food.  Other times she vomits both liquids and solids.  Lately the nausea and vomiting has gotten more persistent.  No coffee grounds or bloody emesis and often may just have dry  heaves.  During the stay at [facility] she received a dose of famotidine which really helped the nausea.  However at discharge no acid suppressing meds were prescribed.  Since discharge unable to eat or drink without vomiting and has persistent severe weakness.  No dysphagia.  Yesterday, she planned to drive to [facility] where she has nephrology appointment later this month, in hopes they would see her  in the emergency room sooner.  However because of the profound weakness EMS was called and transported her to [facility].  EMS noted patient unable to control limb flailing and shaking.  Patient had AMS, was not able to answer any questions lucidly but the next thing she remembers is waking up in the [facility] emergency department.  Family reports a recent fall with head trauma that had not been evaluated. Heart rate 137, BP 155/97, respiratory rate 29, oxygen sats 100% at arrival. Hgb 8.4 >> 7.9 (6.8 - 9.1 during recent [facility] admission), MCV normal.  Platelets 102 (44 -82 during recent [facility] admission) .  No leukocytosis.  BUN/creatinine 59/3.3.  Hyponatremia. Hypomagnesemia, hypocalcemia.  Other than low albumin at 2.1, LFTs at or below normal.  Iron 43, ferritin 2188.  TIBC, iron sats not calculated.  B12 and folate levels WNL.  TSH normal Negative head CT.     Prior to Admission medications   Medication Sig Start Date End Date Taking? Authorizing Provider  apixaban (ELIQUIS) 2.5 MG TABS tablet Take 2.5 mg by mouth 2 (two) times daily.   Yes [provider]  ferrous sulfate 325 (65 FE) MG tablet Take 325 mg by mouth 2 (two) times daily with a meal.   Yes [provider]  levofloxacin (LEVAQUIN) 500 MG tablet Take 500 mg by mouth every other day. For 8 days   Yes [provider]  loperamide (IMODIUM) 2 MG capsule Take 2 mg by mouth 4 (four) times daily as needed for diarrhea or loose stools.   Yes [provider]  magnesium oxide (MAG-OX) 400 MG tablet Take 400 mg by mouth 2 (two) times daily.   Yes [provider]  methadone (DOLOPHINE) 5 MG tablet Take 2.5 mg by mouth every 12 (twelve) hours.   Yes [provider]  metroNIDAZOLE (FLAGYL) 500 MG tablet Take 500 mg by mouth 3 (three) times daily. For 7 days   Yes [provider]  ondansetron (ZOFRAN) 8 MG tablet Take 8 mg by mouth every 8 (eight) hours as needed for nausea or vomiting.    Yes [provider]  predniSONE (DELTASONE) 5 MG tablet Take 5 mg by mouth daily with breakfast.   Yes [provider]  tacrolimus (PROGRAF) 0.5 MG capsule Take 0.5 mg by mouth daily.   Yes [provider]  valACYclovir (VALTREX) 500 MG tablet Take 500 mg by mouth 2 (two) times daily.   Yes [provider]    Scheduled Meds: . apixaban  2.5 mg Oral BID  . levofloxacin  500 mg Oral QODAY  . magnesium oxide  400 mg Oral BID  . methadone  2.5 mg Oral Q12H  . metroNIDAZOLE  500 mg Oral TID  . ondansetron (ZOFRAN) IV  4 mg Intravenous Once  . predniSONE  5 mg Oral Q breakfast  . tacrolimus  0.5 mg Oral Daily   Infusions: . lactated ringers     PRN Meds:    Allergies as of 04/20/2020 - Review Complete 04/20/2020  Allergen Reaction Noted  . Codeine  04/20/2020  .  Hydromorphone Nausea And Vomiting 11/22/2016  . Oxycodone-acetaminophen Itching, Nausea Only, Other (See Comments), and Nausea And Vomiting 04/20/2020    History reviewed. No pertinent family history.  Social History   Socioeconomic History  . Marital status: Married    Spouse name: Not on file  . Number of children: Not on file  . Years of education: Not on file  . Highest education level: Not on file  Occupational History  . Not on file  Tobacco Use  . Smoking status: Never Smoker  . Smokeless tobacco: Never Used  Substance and Sexual Activity  . Alcohol use: Not on file  . Drug use: Not on file  . Sexual activity: Not on file  Other Topics Concern  . Not on file  Social History Narrative  . Not on file   Social Determinants of Health   Financial Resource Strain: Not on file  Food Insecurity: Not on file  Transportation Needs: Not on file  Physical Activity: Not on file  Stress: Not on file  Social Connections: Not on file  Intimate Partner Violence: Not on file    REVIEW OF SYSTEMS: Constitutional: Weakness.  50 # weight loss within the last year ENT:  No  nose bleeds Pulm: No shortness of breath or cough CV:  No palpitations, no LE edema.  GU:  No hematuria, no frequency.  Urine very concentrated and deep colored.  Has difficulty urinating unless she is sitting on the toilet so bedpan makes it difficult to urinate.  No burning on urination GI: See HPI Heme: Had a lot of bruising and purpura develop during recent hospitalization in the setting of thrombocytopenia.  However denies unusual or excessive bleeding from any parts of the body or cavities. Transfusions: See HPI Neuro:  No headaches, no peripheral tingling or numbness.  No syncope, no seizures Derm:  No itching, no rash or sores.  Endocrine:  No sweats or chills.  No polyuria or dysuria Immunization: Not queried Travel:  None beyond local counties in last few months.    PHYSICAL EXAM: Vital signs in last 24 hours: Vitals:   04/21/20 2336 04/22/20 0514  BP: (!) 127/91 (!) 129/96  Pulse: 93 88  Resp: 18 18  Temp: 98.8 F (37.1 C) 98.7 F (37.1 C)  SpO2: 99% 98%   Wt Readings from Last 3 Encounters:  04/20/20 51.6 kg    General: Thin, malnourished, chronically ill and tired appearing, looks somewhat older than stated age. Head: No facial asymmetry or swelling.  Facial muscle wasting Eyes: No scleral icterus or conjunctival pallor.  EOMI Ears: No hearing deficit Nose: No congestion, no discharge Mouth: Fair dentition, mucosa is moist, pink, clear.  Tongue midline. Neck: No masses, thyromegaly, JVD. Lungs: Clear bilaterally.  No labored breathing, no cough. Heart: RRR.  No MRG.  S1, S2 present Abdomen: Soft.  No tenderness.  No HSM appreciated, no masses appreciated.  Bowel sounds active..   Rectal: Deferred Musc/Skeltl: No joint redness, swelling or gross deformity.  Osteopenic appearance. Extremities: No CCE.  Did not closely examine the vulvar wound or top of the right thigh which are not grossly evident when she is laying to on bed with her legs approximated next to  each other. Neurologic: Alert and oriented x3.  Moves all 4 limbs without gross weakness but strength not tested.  No tremors. Skin: Purpura on forearms bilaterally, across the upper sternum below the neck and on both shins. Nodes: No cervical adenopathy Psych: Somewhat flat affect but pleasant,  cooperative and fluid speech.  Intake/Output from previous day: No intake/output data recorded. Intake/Output this shift: Total I/O In: 240 [P.O.:240] Out: -   LAB RESULTS: Recent Labs    04/20/20 1255 04/21/20 0034 04/22/20 0244  WBC 9.6 8.7 8.1  HGB 8.4* 8.5* 7.9*  HCT 26.6* 27.3* 24.6*  PLT 102* 105* 106*   BMET Lab Results  Component Value Date   NA 132 (L) 04/22/2020   NA 131 (L) 04/21/2020   NA 133 (L) 04/20/2020   K 3.9 04/22/2020   K 3.9 04/21/2020   K 3.9 04/20/2020   CL 101 04/22/2020   CL 101 04/21/2020   CL 99 04/20/2020   CO2 23 04/22/2020   CO2 21 (L) 04/21/2020   CO2 21 (L) 04/20/2020   GLUCOSE 107 (H) 04/22/2020   GLUCOSE 89 04/21/2020   GLUCOSE 99 04/20/2020   BUN 58 (H) 04/22/2020   BUN 57 (H) 04/21/2020   BUN 59 (H) 04/20/2020   CREATININE 3.43 (H) 04/22/2020   CREATININE 3.44 (H) 04/21/2020   CREATININE 3.33 (H) 04/20/2020   CALCIUM 8.1 (L) 04/22/2020   CALCIUM 7.9 (L) 04/21/2020   CALCIUM 8.0 (L) 04/20/2020   LFT Recent Labs    04/20/20 1255 04/21/20 0034  PROT 5.2* 5.5*  ALBUMIN 2.1* 2.1*  AST 18 14*  ALT 10 12  ALKPHOS 110 103  BILITOT 1.0 0.9   PT/INR No results found for: INR, PROTIME Hepatitis Panel No results for input(s): HEPBSAG, HCVAB, HEPAIGM, HEPBIGM in the last 72 hours. C-Diff No components found for: CDIFF Lipase  No results found for: LIPASE  Drugs of Abuse  No results found for: LABOPIA, COCAINSCRNUR, LABBENZ, AMPHETMU, THCU, LABBARB   RADIOLOGY STUDIES: CT Head Wo Contrast  Result Date: 04/20/2020 CLINICAL DATA:  Head trauma, altered mental status. Seizure, syncope. EXAM: CT HEAD WITHOUT CONTRAST TECHNIQUE:  Contiguous axial images were obtained from the base of the skull through the vertex without intravenous contrast. COMPARISON:  No pertinent prior exams available for comparison. FINDINGS: Brain: Cerebral volume is normal for age. Tiny chronic left thalamic lacunar infarct (series 3, image 16). There is no acute intracranial hemorrhage. No demarcated cortical infarct. No extra-axial fluid collection. No evidence of intracranial mass. No midline shift. Vascular: No hyperdense vessel.  Atherosclerotic calcifications Skull: Normal. Negative for fracture or focal lesion. Sinuses/Orbits: Visualized orbits show no acute finding. Minimal scattered paranasal sinus mucosal thickening at the imaged levels. Other: Trace fluid within the left mastoid air cells. IMPRESSION: No evidence of acute intracranial abnormality. Tiny chronic left thalamic lacunar infarct. Minimal paranasal sinus mucosal thickening at the imaged levels. Trace fluid within the left mastoid air cells. Electronically Signed   By: Kellie Simmering DO   On: 04/20/2020 14:13   DG Chest Port 1 View  Result Date: 04/20/2020 CLINICAL DATA:  Seizure EXAM: PORTABLE CHEST 1 VIEW COMPARISON:  Portable exam 1251 hours compared to 09/24/2006 FINDINGS: Normal heart size, mediastinal contours, and pulmonary vascularity. Lungs mildly hyperinflated but clear. No infiltrate, pleural effusion or pneumothorax. Osseous structures unremarkable. IMPRESSION: No acute abnormalities. Electronically Signed   By: Lavonia Dana M.D.   On: 04/20/2020 12:58      IMPRESSION:   *   Nausea, vomiting x several months.  Started near end of rads Rx in late 12/2019 but persists.  Not on PPI or H2B meds.   CT 3 weeks ago showed possible small bowel/colonic ileus versus gastroenteritis, changes concerning for mesenteritis On bid Methadone which could add an element  of gastroparesis.  *    Chronic, normocytic anemia.  Multiple outpatient PRBCs in August and September 2021 and again during  [place] admission starting 3 weeks ago.  No overt GI bleeding.  No FOBT testing.    *    Chronic thrombocytopenia.  Platelets improved from low of 44 on 04/12/20.   Hepatosplenomegaly per CTAP of 04/03/2020.  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  *    Hyponatremia.  *    Late stage CKD.  Not yet requiring hemodialysis.  Renal transplants 2010, 2014  *   12/2019 DVT.  On chronic Eliquis, last dose: this AM.      PLAN:     *   EGD. Pt agreeable. Saturday AM. TS starting heparin now, will hold x 6 h before procedure.    *    Initiated Protonix 40 mg IV bid.     Azucena Freed  04/22/2020, 11:31 AM Phone [phone]   I have reviewed the entire case in detail with the above APP and discussed the plan in detail.  Therefore, I agree with the diagnoses recorded above. In addition,  I have personally interviewed and examined the patient and have personally reviewed any abdominal/pelvic CT scan images.  My additional thoughts are as follows:  Unfortunate woman with multiple complex issues.  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  There may be additional upper GI mucosal disease as well, so upper endoscopy is warranted given the duration and severity of her symptoms.  She received a dose of oral anticoagulation today, so our plan is for an upper endoscopy the day after tomorrow.  She was agreeable after discussion of procedure and risks.  The benefits and risks of the planned procedure were described in detail with the patient or (when appropriate) their health care proxy.  Risks were outlined as including, but not limited to, bleeding, infection, perforation, adverse  medication reaction leading to cardiac or pulmonary decompensation, pancreatitis (if ERCP).  The limitation of incomplete mucosal visualization was also discussed.  No guarantees or warranties were given.  Patient at increased risk for cardiopulmonary complications of procedure due to medical comorbidities.  She will be on IV heparin, which we will hold shortly before the procedure.  If no discrete treatable cause found on upper endoscopy, symptomatic treatment with antiemetics will be warranted.  Nelida Meuse III Office:[phone]

## 2020-04-22 NOTE — Progress Notes (Addendum)
Admit: 04/20/2020 LOS: 2  30F CKD4T, stage III Vulvar Cancer finishing CTX/XRT late 2021, FTT unintentional weight loss, persistent N/V esp to solids  Subjective:  . Tolerated dinner and breakfast, no emesis . Renal function is stable . Is on once daily tacrolimus, this was changed at the previous hospital stay, her tacrolimus level was less than 2 on this current dosing . She does not wish to start dialysis  No intake/output data recorded.  Filed Weights   04/20/20 1147 04/20/20 2216  Weight: 54 kg 51.6 kg    Scheduled Meds: . apixaban  2.5 mg Oral BID  . levofloxacin  500 mg Oral QODAY  . magnesium oxide  400 mg Oral BID  . methadone  2.5 mg Oral Q12H  . metroNIDAZOLE  500 mg Oral TID  . ondansetron (ZOFRAN) IV  4 mg Intravenous Once  . predniSONE  5 mg Oral Q breakfast  . tacrolimus  0.5 mg Oral Daily   Continuous Infusions: . lactated ringers     PRN Meds:.  Current Labs: reviewed    Physical Exam:  Blood pressure (!) 128/91, pulse 90, temperature 98.5 F (36.9 C), temperature source Oral, resp. rate 18, height 5\' 5"  (1.651 m), weight 51.6 kg, SpO2 99 %. GEN: chronically ill appearing, NAD, very thin ENT: NCAT, mild temporal wasting EYES: sunken, EOMI CV: RRR nl s1s2 PULM: CTAB ABD: RLQ allograft not tender, no bruits.  S. SKIN: scattered diffuse ecchymoses EXT:No LEE  A 1. S/p KT, CKD4, on Tac/Pred, MMF held during #4; it seems that her kidney is slowly failing and that her sarcopenia and poor nutrition likely are contributing to a GFR loss than estimated but I think that she does not need dialysis.  2. Stage 3 Vulvar Ca followed by GynOnc at [facility] 3. Persistent N/V, unintentional weight loss 4. R soft tissue labial wound 5. Mild TCP 6. Mild hypoNa, stable 7. Anemia 8. ? Seizure like activity, per primary 9. Hx/o femoral DVT on DOAC  P . Resume twice daily dosing of tacrolimus . No further suggestions, will follow-up on patient again tomorrow . No  strong indication to initiate dialysis at the current time   Pearson Grippe MD 04/22/2020, 12:42 PM  Recent Labs  Lab 04/20/20 1255 04/21/20 0034 04/22/20 0244  NA 133* 131* 132*  K 3.9 3.9 3.9  CL 99 101 101  CO2 21* 21* 23  GLUCOSE 99 89 107*  BUN 59* 57* 58*  CREATININE 3.33* 3.44* 3.43*  CALCIUM 8.0* 7.9* 8.1*  PHOS  --  3.6 4.0   Recent Labs  Lab 04/20/20 1255 04/21/20 0034 04/22/20 0244  WBC 9.6 8.7 8.1  NEUTROABS 7.5  --   --   HGB 8.4* 8.5* 7.9*  HCT 26.6* 27.3* 24.6*  MCV 87.2 87.5 86.9  PLT 102* 105* 106*

## 2020-04-23 DIAGNOSIS — R112 Nausea with vomiting, unspecified: Secondary | ICD-10-CM | POA: Diagnosis not present

## 2020-04-23 DIAGNOSIS — D500 Iron deficiency anemia secondary to blood loss (chronic): Secondary | ICD-10-CM

## 2020-04-23 LAB — COMPREHENSIVE METABOLIC PANEL
ALT: 9 U/L (ref 0–44)
AST: 15 U/L (ref 15–41)
Albumin: 1.9 g/dL — ABNORMAL LOW (ref 3.5–5.0)
Alkaline Phosphatase: 109 U/L (ref 38–126)
Anion gap: 9 (ref 5–15)
BUN: 54 mg/dL — ABNORMAL HIGH (ref 6–20)
CO2: 21 mmol/L — ABNORMAL LOW (ref 22–32)
Calcium: 7.9 mg/dL — ABNORMAL LOW (ref 8.9–10.3)
Chloride: 102 mmol/L (ref 98–111)
Creatinine, Ser: 3.37 mg/dL — ABNORMAL HIGH (ref 0.44–1.00)
GFR, Estimated: 16 mL/min — ABNORMAL LOW (ref >60)
Glucose, Bld: 80 mg/dL (ref 70–99)
Potassium: 3.7 mmol/L (ref 3.5–5.1)
Sodium: 132 mmol/L — ABNORMAL LOW (ref 135–145)
Total Bilirubin: 0.7 mg/dL (ref 0.3–1.2)
Total Protein: 5.2 g/dL — ABNORMAL LOW (ref 6.5–8.1)

## 2020-04-23 LAB — CBC
HCT: 21.3 % — ABNORMAL LOW (ref 36.0–46.0)
HCT: 25.6 % — ABNORMAL LOW (ref 36.0–46.0)
Hemoglobin: 6.7 g/dL — CL (ref 12.0–15.0)
Hemoglobin: 8.5 g/dL — ABNORMAL LOW (ref 12.0–15.0)
MCH: 27.3 pg (ref 26.0–34.0)
MCH: 28.8 pg (ref 26.0–34.0)
MCHC: 31.5 g/dL (ref 30.0–36.0)
MCHC: 33.2 g/dL (ref 30.0–36.0)
MCV: 86.9 fL (ref 80.0–100.0)
MCV: 86.8 fL (ref 80.0–100.0)
Platelets: 92 10*3/uL — ABNORMAL LOW (ref 150–400)
Platelets: 93 10*3/uL — ABNORMAL LOW (ref 150–400)
RBC: 2.45 MIL/uL — ABNORMAL LOW (ref 3.87–5.11)
RBC: 2.95 MIL/uL — ABNORMAL LOW (ref 3.87–5.11)
RDW: 15.5 % (ref 11.5–15.5)
RDW: 15.2 % (ref 11.5–15.5)
WBC: 6.7 10*3/uL (ref 4.0–10.5)
WBC: 7.1 10*3/uL (ref 4.0–10.5)
nRBC: 0 % (ref 0.0–0.2)
nRBC: 0 % (ref 0.0–0.2)

## 2020-04-23 LAB — ABO/RH: ABO/RH(D): O POS

## 2020-04-23 LAB — HEPARIN LEVEL (UNFRACTIONATED): Heparin Unfractionated: 1.16 IU/mL — ABNORMAL HIGH (ref 0.30–0.70)

## 2020-04-23 LAB — PHOSPHORUS: Phosphorus: 3.5 mg/dL (ref 2.5–4.6)

## 2020-04-23 LAB — MAGNESIUM: Magnesium: 1.8 mg/dL (ref 1.7–2.4)

## 2020-04-23 LAB — PREPARE RBC (CROSSMATCH)

## 2020-04-23 LAB — APTT: aPTT: 85 seconds — ABNORMAL HIGH (ref 24–36)

## 2020-04-23 MED ORDER — PANTOPRAZOLE SODIUM 40 MG PO TBEC
40.0000 mg | DELAYED_RELEASE_TABLET | Freq: Every day | ORAL | Status: DC
  Administered 2020-04-24: 40 mg via ORAL
  Filled 2020-04-23: qty 1

## 2020-04-23 MED ORDER — SODIUM CHLORIDE 0.9% IV SOLUTION: Freq: Once | INTRAVENOUS | Status: DC

## 2020-04-23 NOTE — Progress Notes (Signed)
Admit: 04/20/2020 LOS: 3  18F CKD4T, stage III Vulvar Cancer finishing CTX/XRT late 2021, FTT unintentional weight loss, persistent N/V esp to solids  Subjective:  . Nausea and vomiting+, gettiing 1 unit  PRBC today. No new event.  . She does not wish to start dialysis  01/20 0701 - 01/21 0700 In: 760.7 [P.O.:720; I.V.:40.7] Out: -   Filed Weights   04/20/20 1147 04/20/20 2216 04/23/20 0525  Weight: 54 kg 51.6 kg 51 kg    Scheduled Meds: . sodium chloride   Intravenous Once  . feeding supplement (NEPRO CARB STEADY)  237 mL Oral TID BM  . magnesium oxide  400 mg Oral BID  . methadone  2.5 mg Oral Q12H  . metroNIDAZOLE  500 mg Oral TID  . ondansetron (ZOFRAN) IV  4 mg Intravenous Once  . pantoprazole (PROTONIX) IV  40 mg Intravenous Q12H  . predniSONE  5 mg Oral Q breakfast  . tacrolimus  0.5 mg Oral BID   Continuous Infusions: . lactated ringers     PRN Meds:.  Current Labs: reviewed    Physical Exam:  Blood pressure (!) 135/95, pulse 88, temperature 98.5 F (36.9 C), temperature source Oral, resp. rate 16, height 5\' 5"  (1.651 m), weight 51 kg, SpO2 99 %. GEN: chronically ill appearing, NAD, frail thin female ENT: NCAT, mild temporal wasting EYES: sunken, EOMI CV: RRR nl s1s2 PULM: CTAB ABD: RLQ allograft not tender, no bruits.  S. SKIN: scattered diffuse ecchymoses EXT:No LEE  A/P 1. S/p KT, CKD4, on Tac/Pred, MMF held during #4; it seems that her kidney is slowly failing and that her sarcopenia and poor nutrition likely are contributing to a GFR loss than estimated but I think that she does not need dialysis. Creatinine level is stable.  2. Stage 3 Vulvar Ca followed by GynOnc at [facility] 3. Persistent N/V, unintentional weight loss, may need IVF if unable to take po or persistent N/V. 4. R soft tissue labial wound 5. Mild TCP 6. Mild hypoNa, stable 7. Anemia: getting PRBC today.  8. Hx/o femoral DVT, heparin on hold.   Renal function overall is stable.   Continue current transplant medication.  May need IV hydration if oral intake is poor.  Palliative care was already consulted. Sign off, please call back with question.  She will need to follow-up with Dr. Johnney Ou after discharge.  Lawson Radar, MD 04/23/2020, 11:29 AM  Recent Labs  Lab 04/21/20 0034 04/22/20 0244 04/23/20 0147  NA 131* 132* 132*  K 3.9 3.9 3.7  CL 101 101 102  CO2 21* 23 21*  GLUCOSE 89 107* 80  BUN 57* 58* 54*  CREATININE 3.44* 3.43* 3.37*  CALCIUM 7.9* 8.1* 7.9*  PHOS 3.6 4.0 3.5   Recent Labs  Lab 04/20/20 1255 04/21/20 0034 04/22/20 0244 04/23/20 0147  WBC 9.6 8.7 8.1 6.7  NEUTROABS 7.5  --   --   --   HGB 8.4* 8.5* 7.9* 6.7*  HCT 26.6* 27.3* 24.6* 21.3*  MCV 87.2 87.5 86.9 86.9  PLT 102* 105* 106* 92*

## 2020-04-23 NOTE — Progress Notes (Addendum)
HD#3 Subjective:   No acute events overnight.   Patient reports she is tired this morning from getting only 2 hours of sleep overnight. Notes frequent interruptions once it was known that her hemoglobin was 6.7. Is excited to report she kept down all of her food yesterday except for a small piece of pork chop.   She states she cannot recall having a BM here in the hospital, has not had previous dark or bloody stools. States she has been iron deficient her whole life, is surprised her iron levels are normal now.  Objective:  Vital signs in last 24 hours: Vitals:   04/22/20 1219 04/22/20 1741 04/23/20 0000 04/23/20 0525  BP: (!) 128/91 (!) 136/98 (!) 138/95 (!) 137/96  Pulse: 90 89 88 92  Resp: 18 18 18 18   Temp: 98.5 F (36.9 C) 98.3 F (36.8 C) 98.3 F (36.8 C) 98.6 F (37 C)  TempSrc: Oral Oral Oral Oral  SpO2: 99% 100% 100% 100%  Weight:    51 kg  Height:       Supplemental O2: Room Air SpO2: 100 %  Physical Exam Vitals and nursing note reviewed.  Constitutional:      General: She is not in acute distress.    Appearance: She is ill-appearing.     Comments: Cachectic   Cardiovascular:     Rate and Rhythm: Normal rate and regular rhythm.     Heart sounds: Normal heart sounds. No murmur heard.   Pulmonary:     Effort: Pulmonary effort is normal.  Musculoskeletal:     Right lower leg: No edema.     Left lower leg: No edema.  Skin:    General: Skin is warm and dry.     Findings: Bruising present.  Neurological:     Mental Status: She is oriented to person, place, and time.  Psychiatric:        Mood and Affect: Mood normal.        Behavior: Behavior normal.     Filed Weights   04/20/20 1147 04/20/20 2216 04/23/20 0525  Weight: 54 kg 51.6 kg 51 kg     Intake/Output Summary (Last 24 hours) at 04/23/2020 0639 Last data filed at 04/23/2020 0300 Gross per 24 hour  Intake 760.72 ml  Output --  Net 760.72 ml   Net IO Since Admission: 1,000.72 mL [04/23/20  0639]  Pertinent Labs: CBC Latest Ref Rng & Units 04/23/2020 04/22/2020 04/21/2020  WBC 4.0 - 10.5 K/uL 6.7 8.1 8.7  Hemoglobin 12.0 - 15.0 g/dL 6.7(LL) 7.9(L) 8.5(L)  Hematocrit 36.0 - 46.0 % 21.3(L) 24.6(L) 27.3(L)  Platelets 150 - 400 K/uL 92(L) 106(L) 105(L)    CMP Latest Ref Rng & Units 04/23/2020 04/22/2020 04/21/2020  Glucose 70 - 99 mg/dL 80 107(H) 89  BUN 6 - 20 mg/dL 54(H) 58(H) 57(H)  Creatinine 0.44 - 1.00 mg/dL 3.37(H) 3.43(H) 3.44(H)  Sodium 135 - 145 mmol/L 132(L) 132(L) 131(L)  Potassium 3.5 - 5.1 mmol/L 3.7 3.9 3.9  Chloride 98 - 111 mmol/L 102 101 101  CO2 22 - 32 mmol/L 21(L) 23 21(L)  Calcium 8.9 - 10.3 mg/dL 7.9(L) 8.1(L) 7.9(L)  Total Protein 6.5 - 8.1 g/dL 5.2(L) - 5.5(L)  Total Bilirubin 0.3 - 1.2 mg/dL 0.7 - 0.9  Alkaline Phos 38 - 126 U/L 109 - 103  AST 15 - 41 U/L 15 - 14(L)  ALT 0 - 44 U/L 9 - 12    Imaging: No results found.  Assessment/Plan:  Active Problems:   Weakness   Pressure injury of skin  Patient Summary: 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   Generalized Weakness  Multifactorial etiology; deconditioning, malnutrition, recent 2 week hospitalization, severe right labial wound and hx of cancer. Nutrition consult pending. Patient tolerating diet without nausea and vomiting, will help improve weakness. PT/OT recommended SNF or CIR. Patient requested CIR, consult placed yesterday for further evaluation.  -Daily BMP -Mg today is 1.8 -nutrition recommendation pending -continue to work with PT/OT -CIR consult pending -B12, TSH are nl -HIV was Negative  Normocytic Anemia Hx of Iron Deficiency Anemia Hx of Femoral Vein DVT Hgb 7.9>6.7. IV heparin discontinued. Heparin level 1.84>1.16 today. She was  transitioned from eliquis to heparin drip yesterday evening in anticipation of EGD.  Supratheraputic levels. Patient denise any dark/tarry stools. Multiple blood transfusions over past few months.Lab unable to calculate rest of iron studies in setting of elevated transferrin levels. Suspect elevated ferritin in setting of acute phase reactant for recent right labial wound. -1 unit of PRBC, repeat cbc this afternoon -iron of 43 -ferritin 2188, elevated in setting of acute infection/severe labial wound -transfuse if under 7  Nausea/Vomiting Patient endorsed no episodes of nausea/vomiting since dinner or with breakfast this morning. -consulted GI, EGD scheduled for Saturday 04/24/20 -Continue to monitor -Renal diet -QTc normal at this time, if nauseated will add anti-emetic   Stage 3C Vulvar Cancer Patient completed treatment at this time. Follows with gyn/onc at [facility]. Evaluated yesterday by Palliative for her chronic medical conditions. Patient's goal documented to be hopeful for continuing treatment options to prolong her quality of life. Would like to remain full code, however, DNR/DNI was discussed between palliative care and the patient and her family. -continue goals of care discussions.  -follow up with gyn/onc on outpatient basis  Kidney Failure s/p kidney transplant 2001 and 2014 Cr stable. Nephro document renal function overall stable and to continue current transplant medications. Recommend IV hydration if oral intake is poor. They will sign off at this time.  Right Labial Soft Tissue Infection Patient remains afebrile, hemodynamically stable, and without leukocytosis. No signs/symptoms of worsening infection.  -Follow wound care recommendations -Continue abx of levofloxacin and metronidazole, end date of 01/22  Seizure-like Activity  No other seizure activity noted since admission. Suspect metabolic in etiology. Will continue to monitor and nephro following and will determine,  along with the patient, if dialysis is needed.  -daily bmp  Diet: Renal IVF: None,None VTE: None in setting of anemia Code: Full  Dispo: Anticipated discharge pending CIR placement [facility] Resident PGY-1 Pager [phone] Please contact the on call pager after 5 pm and on weekends at [phone].

## 2020-04-23 NOTE — Progress Notes (Signed)
Inpatient Rehab Admissions Coordinator:    I have opened a case with Pt.'s insurance but have not yet received a response. I do not have a bed or her today. I have notified Pt. And her spouse. Please contact me with any questions.  Clemens Catholic, [place], [place] Admissions Coordinator  [phone] ([place]) [phone] (office)

## 2020-04-23 NOTE — Progress Notes (Signed)
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 Of note pt with similar symptoms and presented to [facility] ED where she also developed AKI and a large deep wound of her right labia and inner thigh. Imaging was performed which revealed large right perineal mass with new extension into surrounding soft tissue with stranding and gas. No abscess collection noted. She was evaluated by gen surg and gyn/oncology who recommended IV abx alone. Pt now admitted for further work up. CT head with no acute findings.    PT Comments    Pt making significant progress with mobility. Pt able to perform bed mob with use of rails and bed controls with minA, perform transfers with minA, and ambulate ~12 ft with minA and use of RW this date. However, her lower extremity weakness and fear of falling limit her progress. Thus, pt benefited from initiating first stand with pt boxed in by steardy objects to increase her confidence. Focused remainder of session on increasing her strength, see Exercises below. Will continue to follow acutely. Pt very motivated to improve and return to her PLOF and thus would greatly benefit from intensive therapy services in the CIR setting.    Follow Up Recommendations  Supervision for mobility/OOB;CIR     Equipment Recommendations  Rolling walker with 5" wheels;3in1 (PT);Wheelchair (measurements PT);Wheelchair cushion (measurements PT)    Recommendations for Other Services Rehab consult     Precautions / Restrictions Precautions Precautions: Fall Precaution Comments: pt reports x2 falls PTA, reports recent episodes of presyncopy/seizure  activity Restrictions Weight Bearing Restrictions: No    Mobility  Bed Mobility Overal bed mobility: Needs Assistance Bed Mobility: Supine to Sit     Supine to sit: Min assist;HOB elevated     General bed mobility comments: pt able to initiate LEs over EOB with some assist at trunk to complete ascension as she can initiate but not clear her trunk.  Transfers Overall transfer level: Needs assistance Equipment used: Rolling walker (2 wheeled) Transfers: Sit to/from [facility] Sit to Stand: Min assist Stand pivot transfers: Min assist       General transfer comment: Extra time and continual reassurance for pt to initiate sit to stand, keeping 1 hand on RW and pushing up off sitting surface with other. MinA for steadying and to power up to stand. During first sit to stand from EOB, placed back of recliner anterior to pt, commode to R, PT to left, and bed posterior to decrease anxiety, with success. MinA to manage RW and steady with stand step to R to commode.  Ambulation/Gait Ambulation/Gait assistance: Min assist Gait Distance (Feet): 12 Feet Assistive device: Rolling walker (2 wheeled) Gait Pattern/deviations: Step-through pattern;Decreased step length - right;Decreased step length - left;Decreased stride length;Decreased weight shift to right;Decreased weight shift to left Gait velocity: decreased Gait velocity interpretation: <1.31 ft/sec, indicative of household ambulator General Gait Details: Pt ambulates with slow, unsteady gait, shakiness throughout. Pt reported R knee beginning to get weak to buckle, thus sat down. MinA for steadying.   Stairs             Wheelchair Mobility    Modified Rankin (Stroke Patients Only)  Balance Overall balance assessment: Needs assistance;History of Falls Sitting-balance support: Feet supported;Bilateral upper extremity supported Sitting balance-Leahy Scale: Poor Sitting balance - Comments: pt with  prefence for UE support   Standing balance support: Bilateral upper extremity supported Standing balance-Leahy Scale: Poor Standing balance comment: reliant on UE support/external assist                            Cognition Arousal/Alertness: Awake/alert Behavior During Therapy: Flat affect;WFL for tasks assessed/performed;Anxious Overall Cognitive Status: Within Functional Limits for tasks assessed                                 General Comments: Pt upset, crying 1x during session, in regards to her current situation. Pt anxious with standing and mobility due to fear of falling.      Exercises General Exercises - Lower Extremity Long Arc Quad: Strengthening;Both;10 reps;Seated Hip ABduction/ADduction: Strengthening;Both;10 reps;Seated (hip adduction pillow squeezes and hip abduction against manual resistance) Hip Flexion/Marching: Strengthening;Both;10 reps;Seated Other Exercises Other Exercises: Modified sit ups from recliner, AAROM 5x    General Comments        Pertinent Vitals/Pain Pain Assessment: No/denies pain Pain Intervention(s): Monitored during session    Home Living                      Prior Function            PT Goals (current goals can now be found in the care plan section) Acute Rehab PT Goals Patient Stated Goal: return to PLOF PT Goal Formulation: With patient Time For Goal Achievement: 05/05/20 Potential to Achieve Goals: Fair Progress towards PT goals: Progressing toward goals    Frequency    Min 3X/week      PT Plan Discharge plan needs to be updated;Frequency needs to be updated    Co-evaluation              AM-PAC PT "6 Clicks" Mobility   Outcome Measure  Help needed turning from your back to your side while in a flat bed without using bedrails?: A Little Help needed moving from lying on your back to sitting on the side of a flat bed without using bedrails?: A Lot Help needed moving to and  from a bed to a chair (including a wheelchair)?: A Little Help needed standing up from a chair using your arms (e.g., wheelchair or bedside chair)?: A Little Help needed to walk in hospital room?: A Little Help needed climbing 3-5 steps with a railing? : Total 6 Click Score: 15    End of Session Equipment Utilized During Treatment: Gait belt Activity Tolerance: Patient tolerated treatment well;Patient limited by fatigue Patient left: with call bell/phone within reach;in chair;with chair alarm set Nurse Communication: Mobility status PT Visit Diagnosis: Unsteadiness on feet (R26.81);Difficulty in walking, not elsewhere classified (R26.2);Muscle weakness (generalized) (M62.81);History of falling (Z91.81);Other abnormalities of gait and mobility (R26.89)     Time: 4967-5916 PT Time Calculation (min) (ACUTE ONLY): 46 min  Charges:  $Gait Training: 8-22 mins $Therapeutic Exercise: 8-22 mins $Therapeutic Activity: 8-22 mins                     Moishe Spice, PT, DPT Acute Rehabilitation Services  Pager: [phone] Office: [place] 04/23/2020, 4:13 PM

## 2020-04-23 NOTE — Progress Notes (Addendum)
Daily Rounding Note  04/23/2020, 11:54 AM  LOS: 3 days   SUBJECTIVE:   Chief complaint: Nausea, vomiting     Didn't get more than about 2 h sleep overnight, feeling tired this morning.  Hgb dropped to 6.7.  Transfused with 1 PRBC. No N/V yesterday or today.  Tolerating solid food.  No bowel movements since admission.  Stools at home are brown.  OBJECTIVE:         Vital signs in last 24 hours:    Temp:  [98.3 F (36.8 C)-98.7 F (37.1 C)] 98.5 F (36.9 C) (01/21 1053) Pulse Rate:  [88-99] 88 (01/21 1053) Resp:  [16-18] 16 (01/21 1053) BP: (127-138)/(86-98) 135/95 (01/21 1053) SpO2:  [99 %-100 %] 99 % (01/21 1053) Weight:  [51 kg] 51 kg (01/21 0525) Last BM Date: 04/20/20 Filed Weights   04/20/20 1147 04/20/20 2216 04/23/20 0525  Weight: 54 kg 51.6 kg 51 kg   General: Thin, chronically ill-appearing, comfortable, pleasant Heart: RRR Chest: No labored breathing Abdomen: Soft without tenderness.  Active bowel sounds.  No distention Extremities: No CCE. Neuro/Psych: Oriented x3.  Appropriate.  Drowsy.  Intake/Output from previous day: 01/20 0701 - 01/21 0700 In: 760.7 [P.O.:720; I.V.:40.7] Out: -   Intake/Output this shift: Total I/O In: 555 [P.O.:240; Blood:315] Out: -   Lab Results: Recent Labs    04/21/20 0034 04/22/20 0244 04/23/20 0147  WBC 8.7 8.1 6.7  HGB 8.5* 7.9* 6.7*  HCT 27.3* 24.6* 21.3*  PLT 105* 106* 92*   BMET Recent Labs    04/21/20 0034 04/22/20 0244 04/23/20 0147  NA 131* 132* 132*  K 3.9 3.9 3.7  CL 101 101 102  CO2 21* 23 21*  GLUCOSE 89 107* 80  BUN 57* 58* 54*  CREATININE 3.44* 3.43* 3.37*  CALCIUM 7.9* 8.1* 7.9*   LFT Recent Labs    04/20/20 1255 04/21/20 0034 04/23/20 0147  PROT 5.2* 5.5* 5.2*  ALBUMIN 2.1* 2.1* 1.9*  AST 18 14* 15  ALT 10 12 9   ALKPHOS 110 103 109  BILITOT 1.0 0.9 0.7   PT/INR No results for input(s): LABPROT, INR in the last 72  hours. Hepatitis Panel No results for input(s): HEPBSAG, HCVAB, HEPAIGM, HEPBIGM in the last 72 hours.  Studies/Results: No results found.  Scheduled Meds: . sodium chloride   Intravenous Once  . feeding supplement (NEPRO CARB STEADY)  237 mL Oral TID BM  . magnesium oxide  400 mg Oral BID  . methadone  2.5 mg Oral Q12H  . metroNIDAZOLE  500 mg Oral TID  . ondansetron (ZOFRAN) IV  4 mg Intravenous Once  . pantoprazole (PROTONIX) IV  40 mg Intravenous Q12H  . predniSONE  5 mg Oral Q breakfast  . tacrolimus  0.5 mg Oral BID   Continuous Infusions: . lactated ringers     PRN Meds:.  ASSESMENT:   *    Several months nausea, vomiting. Day 2 Protonix 40 iv bid.  No PPI PTA.  Nausea and vomiting appear to have resolved and she is tolerating solid food.  *   Chronic, normocytic anemia.  Multiple PRBC transfusions within the last 6 months. Hb 6.7 this a.m.s/p 1 PRBC this morning  *    Vulvar cancer.  Completed radiation in October 2021.  Chemotherapy limited to about a month in August, couldn't tolerate side effects. Infected right labial wound tracking into thigh, possible osteo of pelvis.  Current antibiotic is metronidazole 500  mg 3 times daily this will finish on 1/22.  Levaquin completed.   *    DVT 12/2019, chronic Eliquis on hold, last dose a.m. 1/20.  No heparin in place.  Pharmacy heparin consult was placed but due to dropped Hgb heparin not initiated  *     Hyponatremia.  *    Stage V CKD.  Renal transplants 2010, 2014.  Not on hemodialysis. Per nephrology note today, pt does not wish to start dialysis.  *    Chronic thrombocytopenia, splenomegaly per previous imaging.   PLAN   *    Upper endoscopy tomorrow morning.  *   Switch Protonix to 40 mg po q day.  *     I messaged the pharmacist to make sure that, if the heparin is started that it will stop at 2 AM tomorrow   Azucena Freed  04/23/2020, 11:54 AM Phone [phone]

## 2020-04-23 NOTE — Progress Notes (Addendum)
Paged by RN for Hgb 6.7. Heparin drip started yesterday night. Will stop heparin at this time and order 1 unit pRBCs.

## 2020-04-23 NOTE — Progress Notes (Signed)
ANTICOAGULATION CONSULT NOTE - Initial Consult  Pharmacy Consult for heparin Indication: DVT  Allergies  Allergen Reactions  . Codeine     vomiting  . Hydromorphone Nausea And Vomiting  . Oxycodone-Acetaminophen Itching, Nausea Only, Other (See Comments) and Nausea And Vomiting    Other Reaction: HA    Patient Measurements: Height: 5\' 5"  (165.1 cm) Weight: 51 kg (112 lb 7 oz) IBW/kg (Calculated) : 57 Heparin Dosing Weight: 51.6 kg   Vital Signs: Temp: 98.6 F (37 C) (01/21 0525) Temp Source: Oral (01/21 0525) BP: 137/96 (01/21 0525) Pulse Rate: 92 (01/21 0525)  Labs: Recent Labs    04/21/20 0034 04/22/20 0244 04/22/20 1839 04/23/20 0147  HGB 8.5* 7.9*  --  6.7*  HCT 27.3* 24.6*  --  21.3*  PLT 105* 106*  --  92*  APTT  --   --  44* 85*  HEPARINUNFRC  --   --  1.84* 1.16*  CREATININE 3.44* 3.43*  --  3.37*  CKTOTAL 19*  --   --   --     Estimated Creatinine Clearance: 15.5 mL/min (A) (by C-G formula based on SCr of 3.37 mg/dL (H)).   Medical History: History reviewed. No pertinent past medical history.  Medications:  Eliquis 2.5 mg BID PTA - Last dose 1/20 @ 0815  Assessment: 55 y.o. F admitted for weakness, N/V, and seizure-like activity. NPO at midnight tonight for EGD, anticipated for tomorrow. On Eliquis PTA for Hx DVT, last dose 1/20 @08 :15. Pharmacy has been consulted to start heparin infusion. Today, patient's Hgb is 7.9 and Plt 106. Given recent Eliquis use, will trend aPTT and HL while on heparin.   Her hgb dropped down to 6.7 so heparin was put on hold. We will f/u when it's appropriate to resume.   Goal of Therapy:  Heparin level 0.3-0.7 units/ml aPTT 66-102 seconds Monitor platelets by anticoagulation protocol: Yes   Plan:  Hold heparin  Onnie Boer, PharmD, BCIDP, AAHIVP, CPP Infectious Disease Pharmacist 04/23/2020 8:28 AM

## 2020-04-24 ENCOUNTER — Encounter (HOSPITAL_COMMUNITY)
Admission: EM | Disposition: A | Discharge status: Rehabilitation Facility | Payer: Self-pay | Source: Home / Self Care | Attending: Internal Medicine

## 2020-04-24 ENCOUNTER — Inpatient Hospital Stay (HOSPITAL_COMMUNITY): Admitting: Anesthesiology

## 2020-04-24 ENCOUNTER — Encounter (HOSPITAL_COMMUNITY): Payer: Self-pay | Admitting: Internal Medicine

## 2020-04-24 HISTORY — PX: ESOPHAGOGASTRODUODENOSCOPY (EGD) WITH PROPOFOL: SHX5813

## 2020-04-24 HISTORY — PX: BIOPSY: SHX5522

## 2020-04-24 LAB — COMPREHENSIVE METABOLIC PANEL
ALT: 8 U/L (ref 0–44)
AST: 14 U/L — ABNORMAL LOW (ref 15–41)
Albumin: 2 g/dL — ABNORMAL LOW (ref 3.5–5.0)
Alkaline Phosphatase: 102 U/L (ref 38–126)
Anion gap: 10 (ref 5–15)
BUN: 50 mg/dL — ABNORMAL HIGH (ref 6–20)
CO2: 21 mmol/L — ABNORMAL LOW (ref 22–32)
Calcium: 8.1 mg/dL — ABNORMAL LOW (ref 8.9–10.3)
Chloride: 102 mmol/L (ref 98–111)
Creatinine, Ser: 3.15 mg/dL — ABNORMAL HIGH (ref 0.44–1.00)
GFR, Estimated: 17 mL/min — ABNORMAL LOW (ref >60)
Glucose, Bld: 73 mg/dL (ref 70–99)
Potassium: 3.8 mmol/L (ref 3.5–5.1)
Sodium: 133 mmol/L — ABNORMAL LOW (ref 135–145)
Total Bilirubin: 0.9 mg/dL (ref 0.3–1.2)
Total Protein: 5.3 g/dL — ABNORMAL LOW (ref 6.5–8.1)

## 2020-04-24 LAB — CBC
HCT: 27.1 % — ABNORMAL LOW (ref 36.0–46.0)
HCT: 30.7 % — ABNORMAL LOW (ref 36.0–46.0)
Hemoglobin: 8.6 g/dL — ABNORMAL LOW (ref 12.0–15.0)
Hemoglobin: 9.3 g/dL — ABNORMAL LOW (ref 12.0–15.0)
MCH: 27.9 pg (ref 26.0–34.0)
MCH: 27.6 pg (ref 26.0–34.0)
MCHC: 31.7 g/dL (ref 30.0–36.0)
MCHC: 30.3 g/dL (ref 30.0–36.0)
MCV: 88 fL (ref 80.0–100.0)
MCV: 91.1 fL (ref 80.0–100.0)
Platelets: 93 10*3/uL — ABNORMAL LOW (ref 150–400)
Platelets: 89 10*3/uL — ABNORMAL LOW (ref 150–400)
RBC: 3.08 MIL/uL — ABNORMAL LOW (ref 3.87–5.11)
RBC: 3.37 MIL/uL — ABNORMAL LOW (ref 3.87–5.11)
RDW: 15.3 % (ref 11.5–15.5)
RDW: 15.6 % — ABNORMAL HIGH (ref 11.5–15.5)
WBC: 7.7 10*3/uL (ref 4.0–10.5)
WBC: 8.4 10*3/uL (ref 4.0–10.5)
nRBC: 0 % (ref 0.0–0.2)
nRBC: 0 % (ref 0.0–0.2)

## 2020-04-24 LAB — BPAM RBC
Blood Product Expiration Date: 202202212359
ISSUE DATE / TIME: 202201210828
Unit Type and Rh: 5100

## 2020-04-24 LAB — TYPE AND SCREEN
ABO/RH(D): O POS
Antibody Screen: NEGATIVE
Unit division: 0

## 2020-04-24 LAB — HEPARIN LEVEL (UNFRACTIONATED): Heparin Unfractionated: 0.46 IU/mL (ref 0.30–0.70)

## 2020-04-24 LAB — APTT: aPTT: 38 seconds — ABNORMAL HIGH (ref 24–36)

## 2020-04-24 SURGERY — ESOPHAGOGASTRODUODENOSCOPY (EGD) WITH PROPOFOL
Anesthesia: Monitor Anesthesia Care

## 2020-04-24 MED ORDER — FLUCONAZOLE 100 MG PO TABS
100.0000 mg | ORAL_TABLET | Freq: Every day | ORAL | Status: DC
  Administered 2020-04-24 – 2020-05-03 (×10): 100 mg via ORAL
  Filled 2020-04-24 (×10): qty 1

## 2020-04-24 MED ORDER — PROPOFOL 500 MG/50ML IV EMUL
INTRAVENOUS | Status: DC | PRN
  Administered 2020-04-24: 100 ug/kg/min via INTRAVENOUS

## 2020-04-24 MED ORDER — HEPARIN (PORCINE) 25000 UT/250ML-% IV SOLN
850.0000 [IU]/h | INTRAVENOUS | Status: DC
  Administered 2020-04-24: 850 [IU]/h via INTRAVENOUS
  Filled 2020-04-24 (×3): qty 250

## 2020-04-24 MED ORDER — PANTOPRAZOLE SODIUM 40 MG PO TBEC
40.0000 mg | DELAYED_RELEASE_TABLET | Freq: Two times a day (BID) | ORAL | Status: DC
  Administered 2020-04-24 – 2020-05-05 (×22): 40 mg via ORAL
  Filled 2020-04-24 (×22): qty 1

## 2020-04-24 MED ORDER — PROPOFOL 10 MG/ML IV BOLUS
INTRAVENOUS | Status: DC | PRN
  Administered 2020-04-24 (×3): 20 mg via INTRAVENOUS

## 2020-04-24 MED ORDER — SODIUM CHLORIDE 0.9 % IV SOLN: INTRAVENOUS | Status: DC

## 2020-04-24 MED ORDER — SODIUM CHLORIDE 0.9 % IV SOLN: INTRAVENOUS | Status: DC | PRN

## 2020-04-24 SURGICAL SUPPLY — 15 items

## 2020-04-24 NOTE — Op Note (Signed)
[facility] Patient Name: Christina Mack Procedure Date : 04/24/2020 MRN: [id number] Attending MD: Estill Cotta. Loletha Carrow , MD Date of Birth: 1965-06-06 CSN: [id number] Age: 55 Admit Type: Inpatient Procedure:                Upper GI endoscopy Indications:              Nausea with vomiting Providers:                Mallie Mussel L. Loletha Carrow, MD, Vista Lawman, RN, Laverda Sorenson, Technician, Clearnce Sorrel, CRNA Referring MD:             [facility] Internal Medicine Teaching Service Medicines:                Monitored Anesthesia Care Complications:            No immediate complications. Estimated Blood Loss:     Estimated blood loss was minimal. Procedure:                Pre-Anesthesia Assessment:                           - Prior to the procedure, a History and Physical                            was performed, and patient medications and                            allergies were reviewed. The patient's tolerance of                            previous anesthesia was also reviewed. The risks                            and benefits of the procedure and the sedation                            options and risks were discussed with the patient.                            All questions were answered, and informed consent                            was obtained. Prior Anticoagulants: The patient has                            taken heparin, last dose was day of procedure. ASA                            Grade Assessment: III - A patient with severe                            systemic disease. After reviewing the risks and  benefits, the patient was deemed in satisfactory                            condition to undergo the procedure.                           After obtaining informed consent, the endoscope was                            passed under direct vision. Throughout the                            procedure, the patient's blood pressure, pulse, and                             oxygen saturations were monitored continuously. The                            GIF-H190 ([id number]) Olympus gastroscope was                            introduced through the mouth, and advanced to the                            second part of duodenum. The upper GI endoscopy was                            accomplished without difficulty. The patient                            tolerated the procedure well. Scope In: Scope Out: Findings:      Diffuse, white plaques were found in the middle third of the esophagus       and in the lower third of the esophagus.      One non-bleeding cratered gastric ulcer with no stigmata of bleeding was       found in the prepyloric region of the stomach. The lesion was 8 mm in       largest dimension. Two biopsies were obtained in the gastric body and in       the gastric antrum with cold forceps for histology/ H. pylori. The       pylorus was patent and the scope passed without significant resistance.       However, the small ulcer was causing adjacent edema.      The exam of the stomach was otherwise normal.      The cardia and gastric fundus were normal on retroflexion.      The examined duodenum was normal. Impression:               - Esophageal plaques were found, consistent with                            candidiasis.                           - Non-bleeding gastric ulcer with no stigmata of  bleeding.                           - Normal examined duodenum.                           - Two biopsies were obtained in the gastric body                            and in the gastric antrum.                           As noted in consult, this appears to be                            multifactorial N/V. Contribution of ulcer to                            symptoms unclear. Despite prepyloric edema from the                            ulcer, there is no evidence of gastric outlet                             obstruction. Recommendation:           - Return patient to hospital ward for ongoing care.                           - Resume regular diet.                           - Twice daily oral PPI x 6 weeks                           - Diflucan (fluconazole) 100 mg PO daily for 2                            weeks.                           - Resume IV heparin for DVT treatment in 6 hours.                           [place] can be resumed tomorrow                           - continue anti-emetics                           - Await pathology results and treat if H. pylori                            positive.                           - GI signing off -  call as need arises. We will                            follow up on the biopsy. Procedure Code(s):        --- Professional ---                           [phone], Esophagogastroduodenoscopy, flexible,                            transoral; with biopsy, single or multiple Diagnosis Code(s):        --- Professional ---                           K22.9, Disease of esophagus, unspecified                           K25.9, Gastric ulcer, unspecified as acute or                            chronic, without hemorrhage or perforation                           R11.2, Nausea with vomiting, unspecified CPT copyright 2019 American Medical Association. All rights reserved. The codes documented in this report are preliminary and upon coder review may  be revised to meet current compliance requirements. [place] Stell L. Loletha Carrow, MD 04/24/2020 9:48:58 AM This report has been signed electronically. Number of Addenda: 0

## 2020-04-24 NOTE — Progress Notes (Signed)
HD#4 Subjective:   No acute events overnight.   Patient states that she slept the best she has so far yesterday evening. She states she is ready for the procedure today. Denies N/V. She has no other complaints. All questions and concerns were addressed at bedside.    Objective:  Vital signs in last 24 hours: Vitals:   04/23/20 1804 04/24/20 0010 04/24/20 0455 04/24/20 0620  BP: 132/88 (!) 135/95 (!) 136/97   Pulse: 97 93 91   Resp: 16 18 18    Temp: 98.2 F (36.8 C) 98.6 F (37 C) 98.6 F (37 C)   TempSrc: Oral Oral Tympanic   SpO2: 100% 99% 100%   Weight:    49.1 kg  Height:       Supplemental O2: Room Air SpO2: 100 %  Physical Exam Vitals and nursing note reviewed.  Constitutional:      General: She is not in acute distress.    Appearance: She is ill-appearing.     Comments: Cachectic   Cardiovascular:     Rate and Rhythm: Normal rate and regular rhythm.     Heart sounds: Normal heart sounds. No murmur heard.   Pulmonary:     Effort: Pulmonary effort is normal.  Musculoskeletal:     Right lower leg: No edema.     Left lower leg: No edema.  Skin:    General: Skin is warm and dry.     Findings: Bruising present.  Neurological:     Mental Status: She is oriented to person, place, and time.  Psychiatric:        Mood and Affect: Mood normal.        Behavior: Behavior normal.     Filed Weights   04/20/20 2216 04/23/20 0525 04/24/20 0620  Weight: 51.6 kg 51 kg 49.1 kg     Intake/Output Summary (Last 24 hours) at 04/24/2020 0718 Last data filed at 04/23/2020 1115 Gross per 24 hour  Intake 555 ml  Output --  Net 555 ml   Net IO Since Admission: 1,555.72 mL [04/24/20 0718]  Pertinent Labs: CBC Latest Ref Rng & Units 04/24/2020 04/23/2020 04/23/2020  WBC 4.0 - 10.5 K/uL 7.7 7.1 6.7  Hemoglobin 12.0 - 15.0 g/dL 8.6(L) 8.5(L) 6.7(LL)  Hematocrit 36.0 - 46.0 % 27.1(L) 25.6(L) 21.3(L)  Platelets 150 - 400 K/uL 93(L) 93(L) 92(L)    CMP Latest Ref Rng &  Units 04/24/2020 04/23/2020 04/22/2020  Glucose 70 - 99 mg/dL 73 80 107(H)  BUN 6 - 20 mg/dL 50(H) 54(H) 58(H)  Creatinine 0.44 - 1.00 mg/dL 3.15(H) 3.37(H) 3.43(H)  Sodium 135 - 145 mmol/L 133(L) 132(L) 132(L)  Potassium 3.5 - 5.1 mmol/L 3.8 3.7 3.9  Chloride 98 - 111 mmol/L 102 102 101  CO2 22 - 32 mmol/L 21(L) 21(L) 23  Calcium 8.9 - 10.3 mg/dL 8.1(L) 7.9(L) 8.1(L)  Total Protein 6.5 - 8.1 g/dL 5.3(L) 5.2(L) -  Total Bilirubin 0.3 - 1.2 mg/dL 0.9 0.7 -  Alkaline Phos 38 - 126 U/L 102 109 -  AST 15 - 41 U/L 14(L) 15 -  ALT 0 - 44 U/L 8 9 -    Imaging: No results found.  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   Generalized Weakness  Multifactorial etiology; deconditioning, malnutrition, recent 2 week hospitalization, severe right labial wound and hx of cancer. Nutrition consult pending. Patient tolerating diet without nausea and vomiting, will help improve weakness. PT recommending CIR. Rehab admissions coordinator opened patient's case, pending insurance response. -Daily BMP -nutrition recommendation pending -continue to work with PT/OT -CIR consult in progress  Normocytic Anemia Hx of Iron Deficiency Anemia Hx of Femoral Vein DVT Hgb 6.7>8.6. Received 1 unit of PRBC yesterday. Uncertain etiology of acute on chronic anemia at this time, denies dark/bloody stools. GI with EGD today. Patient with multiple etiologies for anemia; chronic labial wound, malignancy, and malnutrition. -daily CBC -transfuse if under 7  Nausea/Vomiting Patient endorsed no episodes of nausea/vomiting for the past two days. NPO since midnight in preparation for EGD today.  -EGD  scheduled for today  -Continue to monitor -Renal diet -QTc normal at this time, if nauseated will add anti-emetic   Stage 3C Vulvar Cancer Patient completed treatment at this time. Follows with gyn/onc at [facility]. Evaluated  by Palliative for her chronic medical conditions. Patient's goal documented to be hopeful for continuing treatment options to prolong her quality of life. Would like to remain full code, however, DNR/DNI was discussed between palliative care and the patient and her family. -continue goals of care discussions.  -follow up with gyn/onc on outpatient basis  Kidney Failure s/p kidney transplant 2001 and 2014 Cr stable. Nephro document renal function overall stable and to continue current transplant medications. Recommend IV hydration if oral intake is poor. They will sign off at this time.  Right Labial Soft Tissue Infection Patient remains afebrile, hemodynamically stable, and without leukocytosis. No signs/symptoms of worsening infection.  -Follow wound care recommendations -Continue abx of levofloxacin and metronidazole, end date of 01/22 (today)  Diet: Renal IVF: None,None VTE: None in setting of anemia Code: Full  Dispo: Anticipated discharge pending CIR placement [place] Internal Medicine Resident PGY-1 Pager [phone] Please contact the on call pager after 5 pm and on weekends at [phone].

## 2020-04-24 NOTE — Anesthesia Preprocedure Evaluation (Addendum)
Anesthesia Evaluation  Patient identified by MRN, date of birth, ID band Patient awake    Reviewed: Allergy & Precautions, NPO status , Patient's Chart, lab work & pertinent test results  History of Anesthesia Complications Negative for: history of anesthetic complications  Airway Mallampati: II  TM Distance: >3 FB Neck ROM: Full    Dental  (+) Dental Advisory Given, Chipped,    Pulmonary neg pulmonary ROS,    Pulmonary exam normal        Cardiovascular negative cardio ROS Normal cardiovascular exam     Neuro/Psych negative neurological ROS  negative psych ROS   GI/Hepatic Neg liver ROS,  N/V    Endo/Other   Na 133 Ca 8.1   Renal/GU CRFRenal disease S/p renal transplants in 2010, 2014      Musculoskeletal negative musculoskeletal ROS (+)   Abdominal   Peds  Hematology  (+) anemia ,  Thrombocytopenia, Plt 93k    Anesthesia Other Findings On methadone Covid test negative   Reproductive/Obstetrics                            Anesthesia Physical Anesthesia Plan  ASA: III  Anesthesia Plan: MAC   Post-op Pain Management:    Induction: Intravenous  PONV Risk Score and Plan: 2 and Propofol infusion and Treatment may vary due to age or medical condition  Airway Management Planned: Nasal Cannula and Natural Airway  Additional Equipment: None  Intra-op Plan:   Post-operative Plan:   Informed Consent: I have reviewed the patients History and Physical, chart, labs and discussed the procedure including the risks, benefits and alternatives for the proposed anesthesia with the patient or authorized representative who has indicated his/her understanding and acceptance.       Plan Discussed with: CRNA and Anesthesiologist  Anesthesia Plan Comments:        Anesthesia Quick Evaluation

## 2020-04-24 NOTE — Progress Notes (Signed)
Endo personal calling for report, nurse report given. Endo staff to bedside for pt transport to endo. Pt GCS 15 appears calm rr unlabored skin w/d,  no current vomiting, LPIV intact. Pt husband at bedside.

## 2020-04-24 NOTE — Progress Notes (Signed)
Pt returned from endo GCS 15.  Received nurse report at bedside from endo staff.

## 2020-04-24 NOTE — Transfer of Care (Signed)
Immediate Anesthesia Transfer of Care Note  Patient: Christina Mack  Procedure(s) Performed: ESOPHAGOGASTRODUODENOSCOPY (EGD) WITH PROPOFOL (N/A ) BIOPSY  Patient Location: PACU  Anesthesia Type:MAC  Level of Consciousness: awake, alert  and oriented  Airway & Oxygen Therapy: Patient Spontanous Breathing  Post-op Assessment: Report given to RN and Post -op Vital signs reviewed and stable  Post vital signs: Reviewed and stable  Last Vitals:  Vitals Value Taken Time  BP 132/90 04/24/20 0933  Temp 36.4 C 04/24/20 0933  Pulse 106 04/24/20 0934  Resp 16 04/24/20 0934  SpO2 100 % 04/24/20 0934  Vitals shown include unvalidated device data.  Last Pain:  Vitals:   04/24/20 0856  TempSrc: Oral  PainSc: 0-No pain         Complications: No complications documented.

## 2020-04-24 NOTE — Progress Notes (Signed)
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 Of note pt with similar symptoms and presented to [facility] ED where she also developed AKI and a large deep wound of her right labia and inner thigh. Imaging was performed which revealed large right perineal mass with new extension into surrounding soft tissue with stranding and gas. No abscess collection noted. She was evaluated by gen surg and gyn/oncology who recommended IV abx alone. Pt now admitted for further work up. CT head with no acute findings.   OT comments  Pt. Seen for skilled OT session.  Able to complete bed mobility min a.  Stand pivot from eob to recliner (simulated bsc transfer as pt. Declined use of bsc) also with min a.  Reports fear of falling but willing to try and attempt increasing mobility.  Will cont. To progress adls and functional mobility next session.   Follow Up Recommendations  CIR    Equipment Recommendations  Wheelchair cushion (measurements OT);Wheelchair (measurements OT);3 in 1 bedside commode;Other (comment)    Recommendations for Other Services Rehab consult    Precautions / Restrictions Precautions Precautions: Fall Precaution Comments: pt reports x2 falls PTA, reports recent episodes of presyncopy/seizure activity       Mobility Bed Mobility Overal bed mobility: Needs Assistance Bed Mobility: Rolling;Sidelying to Sit Rolling: Supervision Sidelying to sit: Min assist       General bed mobility comments: able to initiate movement of b les and reach for bed rails, assistance to bring trunk upright and sit upright  Transfers Overall transfer level: Needs  assistance Equipment used: Rolling walker (2 wheeled) Transfers: Sit to/from [facility] Sit to Stand: Min assist Stand pivot transfers: Min assist       General transfer comment: able to stand and initaite pivotal stepts to the R to the recliner.  good hand placement.  small "plop" but recovered well.  prefers to have back of chair reclined also for comfort    Balance                                           ADL either performed or assessed with clinical judgement   ADL Overall ADL's : Needs assistance/impaired                         Toilet Transfer: Minimal assistance;RW;Stand-pivot Toilet Transfer Details (indicate cue type and reason): simulated via transfer to/from EOB to recliner         Functional mobility during ADLs: Minimal assistance General ADL Comments: pt. reports fear of falling as her greatest limitation but is willing to continue to try.     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  Originally from [place] but lives in [place] now.      Pertinent Vitals/ Pain       Pain Assessment: Faces Faces Pain Scale: Hurts little more Pain Location: L hip while laying on R side for dressing change was grimacing and rubbing it Pain Descriptors / Indicators: Discomfort;Aching;Grimacing Pain Intervention(s): Limited activity within patient's tolerance;Monitored during session;Repositioned  Home Living                                          Prior Functioning/Environment               Frequency  Min 2X/week        Progress Toward Goals  OT Goals(current goals can now be found in the care plan section)  Progress towards OT goals: Progressing toward goals     Plan      Co-evaluation                 AM-PAC OT "6 Clicks" Daily Activity     Outcome Measure   Help from another person eating meals?: None Help from another person taking care of personal grooming?: A Little Help from another person toileting, which includes using toliet, bedpan, or urinal?: A Lot Help from another person bathing (including washing, rinsing, drying)?: A Lot Help from another person to put on and taking off regular upper body clothing?: A Little Help from another person to put on and taking off regular lower body clothing?: A Lot 6 Click Score: 16    End of Session Equipment Utilized During Treatment: Gait belt;Rolling walker  OT Visit Diagnosis: Other abnormalities of gait and mobility (R26.89);Unsteadiness on feet (R26.81);Muscle weakness (generalized) (M62.81)   Activity Tolerance Patient tolerated treatment well   Patient Left in chair;with call bell/phone within reach   Nurse Communication Other (comment) (rn present at beg. of session and assisted with iv and also dressing change that was needed)        Time: 4818-5631 OT Time Calculation (min): 27 min  Charges: OT General Charges $OT Visit: 1 Visit OT Treatments $Self Care/Home Management : 23-37 mins  Sonia Baller, COTA/L Acute Rehabilitation [phone]   Janice Coffin 04/24/2020, 12:02 PM

## 2020-04-24 NOTE — Interval H&P Note (Signed)
History and Physical Interval Note:  04/24/2020 9:06 AM  Christina Mack  has presented today for surgery, with the diagnosis of Nausea, vomiting, normocytic anemia..  The various methods of treatment have been discussed with the patient and family. After consideration of risks, benefits and other options for treatment, the patient has consented to  Procedure(s): ESOPHAGOGASTRODUODENOSCOPY (EGD) WITH PROPOFOL (N/A) as a surgical intervention.  The patient's history has been reviewed, patient examined, no change in status, stable for surgery.  I have reviewed the patient's chart and labs.  Questions were answered to the patient's satisfaction.    Patient clinically stable since I last saw her 2 days ago.  Rec'd 1 unit PRBCs yesterday for Hgb 6.7 - > 8.6  Nelida Meuse III

## 2020-04-24 NOTE — Progress Notes (Signed)
ANTICOAGULATION CONSULT NOTE - Initial Consult  Pharmacy Consult for heparin Indication: DVT  Allergies  Allergen Reactions  . Codeine     vomiting  . Hydromorphone Nausea And Vomiting  . Oxycodone-Acetaminophen Itching, Nausea Only, Other (See Comments) and Nausea And Vomiting    Other Reaction: HA    Patient Measurements: Height: 5\' 5"  (165.1 cm) Weight: 49.1 kg (108 lb 3.9 oz) IBW/kg (Calculated) : 57 Heparin Dosing Weight: 51.6 kg   Vital Signs: Temp: 98 F (36.7 C) (01/22 1003) Temp Source: Oral (01/22 0856) BP: 132/94 (01/22 1003) Pulse Rate: 91 (01/22 1003)  Labs: Recent Labs    04/22/20 0244 04/22/20 1839 04/23/20 0147 04/23/20 1238 04/24/20 0428  HGB 7.9*  --  6.7* 8.5* 8.6*  HCT 24.6*  --  21.3* 25.6* 27.1*  PLT 106*  --  92* 93* 93*  APTT  --  44* 85*  --  38*  HEPARINUNFRC  --  1.84* 1.16*  --  0.46  CREATININE 3.43*  --  3.37*  --  3.15*    Estimated Creatinine Clearance: 16 mL/min (A) (by C-G formula based on SCr of 3.15 mg/dL (H)).   Medical History:   Medications:  Eliquis 2.5 mg BID PTA - Last dose 1/20 @ 0815  Assessment: 55 y.o. F admitted for weakness, N/V, and seizure-like activity. EGD completed on 1/22 showing nonbleeding ulcers. Ok to continue [facility] at 1600 today and [place] on 1/23. Pharmacy has be consulted to restart heparin at 1600. Today, patient's Hgb is 8.6 and Plt 93. Given recent Eliquis use, will trend aPTT and HL while on heparin. Given recent concern for bleeding, will not give bolus. Continue to monitor plans to transition back to oral therapy.   Goal of Therapy:  Heparin level 0.3-0.7 units/ml aPTT 66-102 seconds Monitor platelets by anticoagulation protocol: Yes   Plan:  Start heparin infusion at 850 units/hr on 1/22 @1600  Check 8-hour aPTT and HL to ensure therapeutic Monitor for signs and symptoms of bleed Continue to monitor H&H and platelets  Cephus Slater, PharmD, [place] Resident [phone] 04/24/2020  10:39 AM  Please check [email or web address] for unit-specific pharmacy phone numbers.

## 2020-04-24 NOTE — Anesthesia Postprocedure Evaluation (Signed)
Anesthesia Post Note  Patient: Christina Mack  Procedure(s) Performed: ESOPHAGOGASTRODUODENOSCOPY (EGD) WITH PROPOFOL (N/A ) BIOPSY     Patient location during evaluation: PACU Anesthesia Type: MAC Level of consciousness: awake and alert Pain management: pain level controlled Vital Signs Assessment: post-procedure vital signs reviewed and stable Respiratory status: spontaneous breathing, nonlabored ventilation and respiratory function stable Cardiovascular status: stable and blood pressure returned to baseline Anesthetic complications: no   No complications documented.  Last Vitals:  Vitals:   04/24/20 0948 04/24/20 1003  BP: (!) 129/91 (!) 132/94  Pulse: 99 91  Resp: 12 16  Temp:  36.7 C  SpO2: 99% 99%    Last Pain:  Vitals:   04/24/20 1003  TempSrc:   PainSc: 0-No pain                 Audry Pili

## 2020-04-25 DIAGNOSIS — Z86718 Personal history of other venous thrombosis and embolism: Secondary | ICD-10-CM

## 2020-04-25 LAB — BASIC METABOLIC PANEL
Anion gap: 11 (ref 5–15)
BUN: 45 mg/dL — ABNORMAL HIGH (ref 6–20)
CO2: 20 mmol/L — ABNORMAL LOW (ref 22–32)
Calcium: 7.9 mg/dL — ABNORMAL LOW (ref 8.9–10.3)
Chloride: 102 mmol/L (ref 98–111)
Creatinine, Ser: 2.81 mg/dL — ABNORMAL HIGH (ref 0.44–1.00)
GFR, Estimated: 19 mL/min — ABNORMAL LOW (ref >60)
Glucose, Bld: 81 mg/dL (ref 70–99)
Potassium: 3.7 mmol/L (ref 3.5–5.1)
Sodium: 133 mmol/L — ABNORMAL LOW (ref 135–145)

## 2020-04-25 LAB — CBC
HCT: 27.2 % — ABNORMAL LOW (ref 36.0–46.0)
Hemoglobin: 8.8 g/dL — ABNORMAL LOW (ref 12.0–15.0)
MCH: 28.4 pg (ref 26.0–34.0)
MCHC: 32.4 g/dL (ref 30.0–36.0)
MCV: 87.7 fL (ref 80.0–100.0)
Platelets: 94 10*3/uL — ABNORMAL LOW (ref 150–400)
RBC: 3.1 MIL/uL — ABNORMAL LOW (ref 3.87–5.11)
RDW: 15.6 % — ABNORMAL HIGH (ref 11.5–15.5)
WBC: 8.8 10*3/uL (ref 4.0–10.5)
nRBC: 0 % (ref 0.0–0.2)

## 2020-04-25 LAB — APTT: aPTT: 106 seconds — ABNORMAL HIGH (ref 24–36)

## 2020-04-25 LAB — HEPARIN LEVEL (UNFRACTIONATED)
Heparin Unfractionated: 0.46 IU/mL (ref 0.30–0.70)
Heparin Unfractionated: 0.45 IU/mL (ref 0.30–0.70)

## 2020-04-25 MED ORDER — LIDOCAINE 5 % EX PTCH: 1.0000 | MEDICATED_PATCH | CUTANEOUS | Status: DC

## 2020-04-25 MED ORDER — ACETAMINOPHEN 325 MG PO TABS
650.0000 mg | ORAL_TABLET | Freq: Four times a day (QID) | ORAL | Status: DC | PRN
  Administered 2020-04-25: 650 mg via ORAL
  Filled 2020-04-25: qty 2

## 2020-04-25 MED ORDER — LACTATED RINGERS IV SOLN: INTRAVENOUS | Status: DC

## 2020-04-25 MED ORDER — LACTATED RINGERS IV BOLUS
1000.0000 mL | Freq: Once | INTRAVENOUS | Status: AC
  Administered 2020-04-25: 1000 mL via INTRAVENOUS

## 2020-04-25 MED ORDER — MUSCLE RUB 10-15 % EX CREA
TOPICAL_CREAM | Freq: Once | CUTANEOUS | Status: AC
  Administered 2020-04-25: 1 via TOPICAL
  Filled 2020-04-25: qty 85

## 2020-04-25 MED ORDER — MUSCLE RUB 10-15 % EX CREA
TOPICAL_CREAM | CUTANEOUS | Status: DC | PRN
  Filled 2020-04-25: qty 85

## 2020-04-25 MED ORDER — APIXABAN 2.5 MG PO TABS
2.5000 mg | ORAL_TABLET | Freq: Two times a day (BID) | ORAL | Status: DC
  Administered 2020-04-25 – 2020-05-04 (×19): 2.5 mg via ORAL
  Filled 2020-04-25 (×19): qty 1

## 2020-04-25 MED ORDER — MUSCLE RUB 10-15 % EX CREA: TOPICAL_CREAM | CUTANEOUS | Status: DC | PRN

## 2020-04-25 MED ORDER — ACETAMINOPHEN 500 MG PO TABS
1000.0000 mg | ORAL_TABLET | Freq: Three times a day (TID) | ORAL | Status: DC
  Administered 2020-04-25 – 2020-05-05 (×31): 1000 mg via ORAL
  Filled 2020-04-25 (×32): qty 2

## 2020-04-25 NOTE — Progress Notes (Signed)
Patient refused dressing change this morning stating " I prefer to do it myself , I have dressing supplies at my bedside".

## 2020-04-25 NOTE — Progress Notes (Signed)
HD#5 Subjective:   No acute events overnight.   Christina Mack was seen and evaluated at bedside. She endorses continues hip pain last night. She states that the pain did keep her up through the night. She states that she has this pain at home, and that she has arthritis. Usually she uses a heating pad, icy hot, or occasionally tylenol will help with her pain at home. The tylenol did not help alleviate her pain last night. Positionally, she notes the pain is worse when lying flat, but seems to be having a difficult time finding a comfortable position.  She was updated on the plan that we are awaiting CIR placement and will begin treating her esophageal candida infection. She completed her abx for her right labial wound yesterday. She notes drinking "a lot" of water. She has no other concerns or questions at the time of my examination.  Objective:  Vital signs in last 24 hours: Vitals:   04/24/20 1755 04/24/20 2322 04/25/20 0417 04/25/20 1215  BP: (!) 137/91 (!) 141/95 (!) 137/94 (!) 135/100  Pulse: (!) 101 91 98 95  Resp: 16 16 16 18   Temp: 98.7 F (37.1 C) 98.4 F (36.9 C) 98.5 F (36.9 C) 98.7 F (37.1 C)  TempSrc: Oral Oral Oral Oral  SpO2: 99% 98% 98% 98%  Weight:   50.8 kg   Height:       Supplemental O2: Room Air SpO2: 98 %  Constitutional: ill appearing, thin, sitting in bed, in no acute distress Cardiovascular: regular rate and rhythm Pulmonary/Chest: normal work of breathing on room air Abdominal: soft, non-tender, non-distended MSK: Weakness present, patient with diffuse left upper thigh pain. No local tenderness noted. Worse with left knee extension. Improvement with left hip flexion. Neurological: alert & oriented x 3, 5/5 strength in bilateral upper and lower extremities, normal gait Skin: Dry, poor skin turgor  Psych: alert and oriented   Filed Weights   04/24/20 0620 04/24/20 0856 04/25/20 0417  Weight: 49.1 kg 49.1 kg 50.8 kg     Intake/Output Summary  (Last 24 hours) at 04/25/2020 1433 Last data filed at 04/25/2020 0418 Gross per 24 hour  Intake 240 ml  Output --  Net 240 ml   Net IO Since Admission: 1,895.72 mL [04/25/20 1433]  Pertinent Labs: CBC Latest Ref Rng & Units 04/25/2020 04/24/2020 04/24/2020  WBC 4.0 - 10.5 K/uL 8.8 8.4 7.7  Hemoglobin 12.0 - 15.0 g/dL 8.8(L) 9.3(L) 8.6(L)  Hematocrit 36.0 - 46.0 % 27.2(L) 30.7(L) 27.1(L)  Platelets 150 - 400 K/uL 94(L) 89(L) 93(L)    CMP Latest Ref Rng & Units 04/25/2020 04/24/2020 04/23/2020  Glucose 70 - 99 mg/dL 81 73 80  BUN 6 - 20 mg/dL 45(H) 50(H) 54(H)  Creatinine 0.44 - 1.00 mg/dL 2.81(H) 3.15(H) 3.37(H)  Sodium 135 - 145 mmol/L 133(L) 133(L) 132(L)  Potassium 3.5 - 5.1 mmol/L 3.7 3.8 3.7  Chloride 98 - 111 mmol/L 102 102 102  CO2 22 - 32 mmol/L 20(L) 21(L) 21(L)  Calcium 8.9 - 10.3 mg/dL 7.9(L) 8.1(L) 7.9(L)  Total Protein 6.5 - 8.1 g/dL - 5.3(L) 5.2(L)  Total Bilirubin 0.3 - 1.2 mg/dL - 0.9 0.7  Alkaline Phos 38 - 126 U/L - 102 109  AST 15 - 41 U/L - 14(L) 15  ALT 0 - 44 U/L - 8 9    Imaging: No results found.  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   Generalized Weakness  Multifactorial etiology; deconditioning, malnutrition, recent 2 week hospitalization, severe right labial wound and hx of cancer. Nutrition consult pending. Continues to tolerate diet well, suspect she is dehydrated as well. Will order 1L LR. Working with social work for [employer] placement. -Daily BMP -1L LR today -nutrition recommendation pending -continue to work with PT/OT -CIR consult in progress  Left Upper Leg Pain Patient with left upper thigh pain that she notes is  chronic and difficult to manage. She suspects exacerbation of her pain from her limited mobility secondary to her weakness. With her eating/drinking more, I believe her weakness will improve. Repositioned in bed during exam, noted more comfortable position. -muscle rub PRN -tylenol 100 mg TID scheduled -PT/OT  Esophageal Candidiasis  White plaques consistent with candida infection visualized on EGD. Suspect secondary to immunosuppressives for her renal transplant. She was started on oral fluconazole, however, notified by pharmacy of possible interaction with tacrolimus. Plan to check tacrolimus level in 2 days.   Normocytic Anemia Hx of Iron Deficiency Anemia Hx of Femoral Vein DVT Hgb 9.3>8.8. EGD with no clear etiology for blood loss. Suspect secondary to chronic wound, malignancy, and malnutrition. Patient to be switched back to oral anticoagulation today per pharmacy.  -daily CBC -transfuse if under 7  Nausea/Vomiting Patient endorsed no episodes of nausea/vomiting for the past three days.  -Continue to monitor -Renal diet -QTc normal at this time, if nauseated will add anti-emetic   Stage 3C Vulvar Cancer Patient completed treatment at this time. Follows with gyn/onc at [facility]. Evaluated  by Palliative for her chronic medical conditions. Patient's goal documented to be hopeful for continuing treatment options to prolong her quality of life. Would like to remain full code, however, DNR/DNI was discussed between palliative care and the patient and her family. -continue goals of care discussions.  -follow up with gyn/onc on outpatient basis  Kidney Failure s/p kidney transplant 2001 and 2014 Cr improving with increased PO intake. Nephro document renal function overall stable and to continue current transplant medications. They will sign off at this time.  Right Labial Soft Tissue Infection Patient remains afebrile, hemodynamically stable, and without leukocytosis. No signs/symptoms of  worsening infection.  -Follow wound care recommendations -completed course of levofloxacin and metronidazole on01/22 (today)  Hx of Femoral Vein DVT -will transition from heparin to home dose of apixaban  Diet: Renal IVF: None,None VTE: None in setting of anemia Code: Full  Dispo: Anticipated discharge pending CIR placement [facility] Resident PGY-1 Pager [phone] Please contact the on call pager after 5 pm and on weekends at [phone].

## 2020-04-25 NOTE — Progress Notes (Deleted)
HD#5 Subjective:   No acute events overnight.   Christina Mack was seen and evaluated at bedside. She endorses continues hip pain last night. She states that the pain did keep her up through the night. She states that she has this pain at home, and that she has arthritis. Usually she uses a heating pad, icy hot, or occasionally tylenol will help with her pain at home. The tylenol did not help alleviate her pain last night. Positionally, she notes the pain is worse when lying flat, but seems to be having a difficult time finding a comfortable position.  She was updated on the plan that we are awaiting CIR placement and will begin treating her esophageal candida infection. She completed her abx for her right labial wound yesterday. She notes drinking "a lot" of water. She has no other concerns or questions at the time of my examination.  Objective:  Vital signs in last 24 hours: Vitals:   04/24/20 1136 04/24/20 1755 04/24/20 2322 04/25/20 0417  BP: (!) 134/98 (!) 137/91 (!) 141/95 (!) 137/94  Pulse: 98 (!) 101 91 98  Resp: 16 16 16 16   Temp: 98.5 F (36.9 C) 98.7 F (37.1 C) 98.4 F (36.9 C) 98.5 F (36.9 C)  TempSrc: Oral Oral Oral Oral  SpO2: 99% 99% 98% 98%  Weight:    50.8 kg  Height:       Supplemental O2: Room Air SpO2: 98 %  Constitutional: ill appearing, thin, sitting in bed, in no acute distress Cardiovascular: regular rate and rhythm Pulmonary/Chest: normal work of breathing on room air Abdominal: soft, non-tender, non-distended MSK: Weakness present, patient with diffuse left upper thigh pain. No local tenderness noted. Worse with left knee extension. Improvement with left hip flexion. Neurological: alert & oriented x 3, 5/5 strength in bilateral upper and lower extremities, normal gait Skin: Dry, poor skin turgor  Psych: alert and oriented   Filed Weights   04/24/20 0620 04/24/20 0856 04/25/20 0417  Weight: 49.1 kg 49.1 kg 50.8 kg     Intake/Output Summary (Last  24 hours) at 04/25/2020 0629 Last data filed at 04/25/2020 0418 Gross per 24 hour  Intake 340 ml  Output -  Net 340 ml   Net IO Since Admission: 1,895.72 mL [04/25/20 0629]  Pertinent Labs: CBC Latest Ref Rng & Units 04/25/2020 04/24/2020 04/24/2020  WBC 4.0 - 10.5 K/uL 8.8 8.4 7.7  Hemoglobin 12.0 - 15.0 g/dL 8.8(L) 9.3(L) 8.6(L)  Hematocrit 36.0 - 46.0 % 27.2(L) 30.7(L) 27.1(L)  Platelets 150 - 400 K/uL 94(L) 89(L) 93(L)    CMP Latest Ref Rng & Units 04/25/2020 04/24/2020 04/23/2020  Glucose 70 - 99 mg/dL 81 73 80  BUN 6 - 20 mg/dL 45(H) 50(H) 54(H)  Creatinine 0.44 - 1.00 mg/dL 2.81(H) 3.15(H) 3.37(H)  Sodium 135 - 145 mmol/L 133(L) 133(L) 132(L)  Potassium 3.5 - 5.1 mmol/L 3.7 3.8 3.7  Chloride 98 - 111 mmol/L 102 102 102  CO2 22 - 32 mmol/L 20(L) 21(L) 21(L)  Calcium 8.9 - 10.3 mg/dL 7.9(L) 8.1(L) 7.9(L)  Total Protein 6.5 - 8.1 g/dL - 5.3(L) 5.2(L)  Total Bilirubin 0.3 - 1.2 mg/dL - 0.9 0.7  Alkaline Phos 38 - 126 U/L - 102 109  AST 15 - 41 U/L - 14(L) 15  ALT 0 - 44 U/L - 8 9    Imaging: No results found.  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   Generalized Weakness  Multifactorial etiology; deconditioning, malnutrition, recent 2 week hospitalization, severe right labial wound and hx of cancer. Nutrition consult pending. Continues to tolerate diet well, suspect she is dehydrated as well. Will order 1L LR. Working with social work for [employer] placement. -Daily BMP -1L LR today -nutrition recommendation pending -continue to work with PT/OT -CIR consult in progress  Left Upper Leg Pain Patient with left upper thigh pain that she notes is chronic and  difficult to manage. She suspects exacerbation of her pain from her limited mobility secondary to her weakness. With her eating/drinking more, I believe her weakness will improve. Repositioned in bed during exam, noted more comfortable position. -muscle rub PRN -tylenol 100 mg TID scheduled -PT/OT  Esophageal Candidiasis  White plaques consistent with candida infection visualized on EGD. Suspect secondary to immunosuppressives for her renal transplant. She was started on oral fluconazole, however, notified by pharmacy of possible interaction with tacrolimus. Plan to check tacrolimus level in 2 days.   Normocytic Anemia Hx of Iron Deficiency Anemia Hx of Femoral Vein DVT Hgb 9.3>8.8. EGD with no clear etiology for blood loss. Suspect secondary to chronic wound, malignancy, and malnutrition. Patient to be switched back to oral anticoagulation today per pharmacy.  -daily CBC -transfuse if under 7  Nausea/Vomiting Patient endorsed no episodes of nausea/vomiting for the past three days.  -Continue to monitor -Renal diet -QTc normal at this time, if nauseated will add anti-emetic   Stage 3C Vulvar Cancer Patient completed treatment at this time. Follows with gyn/onc at [facility]. Evaluated  by Palliative for her chronic medical conditions. Patient's goal documented to be hopeful for continuing treatment options to prolong her quality of life. Would like to remain full code, however, DNR/DNI was discussed between palliative care and the patient and her family. -continue goals of care discussions.  -follow up with gyn/onc on outpatient basis  Kidney Failure s/p kidney transplant 2001 and 2014 Cr improving with increased PO intake. Nephro document renal function overall stable and to continue current transplant medications. They will sign off at this time.  Right Labial Soft Tissue Infection Patient remains afebrile, hemodynamically stable, and without leukocytosis. No signs/symptoms of worsening  infection.  -Follow wound care recommendations -completed course of levofloxacin and metronidazole on01/22 (today)  Diet: Renal IVF: None,None VTE: None in setting of anemia Code: Full  Dispo: Anticipated discharge pending CIR placement [facility] Resident PGY-1 Pager [phone] Please contact the on call pager after 5 pm and on weekends at [phone].

## 2020-04-25 NOTE — Progress Notes (Addendum)
ANTICOAGULATION CONSULT NOTE - Initial Consult  Addendum: Transitioning to PTA oral apixaban 2.5mg  BID. Surgery ok with restarting oral anticoagulation and MD wishes to continue home dose of apixaban. Notified nurse to begin oral AC and stop heparin drip. Hgb and plts stable.   Pharmacy Consult for heparin Indication: DVT   Patient Measurements: Height: 5\' 5"  (165.1 cm) Weight: 50.8 kg (111 lb 15.9 oz) IBW/kg (Calculated) : 57 Heparin Dosing Weight: 51.6 kg   Vital Signs: Temp: 98.5 F (36.9 C) (01/23 0417) Temp Source: Oral (01/23 0417) BP: 137/94 (01/23 0417) Pulse Rate: 98 (01/23 0417)  Labs: Recent Labs    04/23/20 0147 04/23/20 1238 04/24/20 0428 04/24/20 1154 04/25/20 0302 04/25/20 1050  HGB 6.7*   < > 8.6* 9.3* 8.8*  --   HCT 21.3*   < > 27.1* 30.7* 27.2*  --   PLT 92*   < > 93* 89* 94*  --   APTT 85*  --  38*  --  106*  --   HEPARINUNFRC 1.16*  --  0.46  --  0.46 0.45  CREATININE 3.37*  --  3.15*  --  2.81*  --    < > = values in this interval not displayed.    Estimated Creatinine Clearance: 18.6 mL/min (A) (by C-G formula based on SCr of 2.81 mg/dL (H)).   Medical History:   Medications:  Eliquis 2.5 mg BID PTA - Last dose 1/20 @ 0815  Assessment: 55 y.o. F admitted for weakness, N/V, and seizure-like activity. EGD completed on 1/22 showing nonbleeding ulcers. AC continued at 1830 on 1/22. Today, patient's Hgb is 8.8 and Plt 94. Confirmatory heparin level this AM is 0.45 which is at goal. Given recent Eliquis use, will trend aPTT and HL while on heparin. Given recent concern for bleeding, will not give bolus. Continue to monitor plans to transition back to oral therapy.   Goal of Therapy:  Heparin level 0.3-0.7 units/ml aPTT 66-102 seconds Monitor platelets by anticoagulation protocol: Yes   Plan:  Continue heparin infusion at 850 units/hr Monitor for signs and symptoms of bleed Continue to monitor H&H and platelets F/u plans to transition to  [place], PharmD, [place] Resident [phone] 04/25/2020 11:43 AM  Please check [email or web address] for unit-specific pharmacy phone numbers.

## 2020-04-25 NOTE — Progress Notes (Signed)
ANTICOAGULATION CONSULT NOTE - Follow Up Consult  Pharmacy Consult for heparin Indication: DVT  Labs: Recent Labs    04/23/20 0147 04/23/20 1238 04/24/20 0428 04/24/20 1154 04/25/20 0302  HGB 6.7*   < > 8.6* 9.3* 8.8*  HCT 21.3*   < > 27.1* 30.7* 27.2*  PLT 92*   < > 93* 89* 94*  APTT 85*  --  38*  --  106*  HEPARINUNFRC 1.16*  --  0.46  --  0.46  CREATININE 3.37*  --  3.15*  --  2.81*   < > = values in this interval not displayed.    Assessment/Plan:  55yo female therapeutic on heparin with initial dosing while Eliquis on hold. Will continue gtt at current rate and confirm stable with additional level.   Wynona Neat, PharmD, BCPS  04/25/2020,6:13 AM

## 2020-04-26 ENCOUNTER — Inpatient Hospital Stay (HOSPITAL_COMMUNITY)

## 2020-04-26 ENCOUNTER — Encounter (HOSPITAL_COMMUNITY): Payer: Self-pay | Admitting: Gastroenterology

## 2020-04-26 DIAGNOSIS — B9689 Other specified bacterial agents as the cause of diseases classified elsewhere: Secondary | ICD-10-CM

## 2020-04-26 DIAGNOSIS — M86151 Other acute osteomyelitis, right femur: Secondary | ICD-10-CM

## 2020-04-26 DIAGNOSIS — S31109A Unspecified open wound of abdominal wall, unspecified quadrant without penetration into peritoneal cavity, initial encounter: Secondary | ICD-10-CM | POA: Diagnosis present

## 2020-04-26 DIAGNOSIS — M25551 Pain in right hip: Secondary | ICD-10-CM

## 2020-04-26 DIAGNOSIS — Z940 Kidney transplant status: Secondary | ICD-10-CM

## 2020-04-26 DIAGNOSIS — N762 Acute vulvitis: Secondary | ICD-10-CM

## 2020-04-26 DIAGNOSIS — R7881 Bacteremia: Secondary | ICD-10-CM | POA: Diagnosis not present

## 2020-04-26 DIAGNOSIS — N184 Chronic kidney disease, stage 4 (severe): Secondary | ICD-10-CM | POA: Diagnosis present

## 2020-04-26 DIAGNOSIS — N766 Ulceration of vulva: Secondary | ICD-10-CM

## 2020-04-26 DIAGNOSIS — B3781 Candidal esophagitis: Secondary | ICD-10-CM | POA: Diagnosis present

## 2020-04-26 DIAGNOSIS — M86152 Other acute osteomyelitis, left femur: Secondary | ICD-10-CM

## 2020-04-26 DIAGNOSIS — C519 Malignant neoplasm of vulva, unspecified: Secondary | ICD-10-CM

## 2020-04-26 DIAGNOSIS — D649 Anemia, unspecified: Secondary | ICD-10-CM | POA: Diagnosis present

## 2020-04-26 DIAGNOSIS — K259 Gastric ulcer, unspecified as acute or chronic, without hemorrhage or perforation: Secondary | ICD-10-CM

## 2020-04-26 DIAGNOSIS — E440 Moderate protein-calorie malnutrition: Secondary | ICD-10-CM | POA: Insufficient documentation

## 2020-04-26 DIAGNOSIS — M79662 Pain in left lower leg: Secondary | ICD-10-CM

## 2020-04-26 DIAGNOSIS — B966 Bacteroides fragilis [B. fragilis] as the cause of diseases classified elsewhere: Secondary | ICD-10-CM | POA: Diagnosis not present

## 2020-04-26 DIAGNOSIS — D696 Thrombocytopenia, unspecified: Secondary | ICD-10-CM | POA: Diagnosis present

## 2020-04-26 DIAGNOSIS — M25552 Pain in left hip: Secondary | ICD-10-CM

## 2020-04-26 DIAGNOSIS — G8929 Other chronic pain: Secondary | ICD-10-CM

## 2020-04-26 LAB — BASIC METABOLIC PANEL
Anion gap: 9 (ref 5–15)
BUN: 42 mg/dL — ABNORMAL HIGH (ref 6–20)
CO2: 23 mmol/L (ref 22–32)
Calcium: 8.1 mg/dL — ABNORMAL LOW (ref 8.9–10.3)
Chloride: 99 mmol/L (ref 98–111)
Creatinine, Ser: 2.86 mg/dL — ABNORMAL HIGH (ref 0.44–1.00)
GFR, Estimated: 19 mL/min — ABNORMAL LOW (ref >60)
Glucose, Bld: 122 mg/dL — ABNORMAL HIGH (ref 70–99)
Potassium: 4.4 mmol/L (ref 3.5–5.1)
Sodium: 131 mmol/L — ABNORMAL LOW (ref 135–145)

## 2020-04-26 LAB — CBC
HCT: 26.6 % — ABNORMAL LOW (ref 36.0–46.0)
Hemoglobin: 8.2 g/dL — ABNORMAL LOW (ref 12.0–15.0)
MCH: 27.6 pg (ref 26.0–34.0)
MCHC: 30.8 g/dL (ref 30.0–36.0)
MCV: 89.6 fL (ref 80.0–100.0)
Platelets: 85 10*3/uL — ABNORMAL LOW (ref 150–400)
RBC: 2.97 MIL/uL — ABNORMAL LOW (ref 3.87–5.11)
RDW: 15.4 % (ref 11.5–15.5)
WBC: 9.3 10*3/uL (ref 4.0–10.5)
nRBC: 0 % (ref 0.0–0.2)

## 2020-04-26 LAB — AMMONIA: Ammonia: 23 umol/L (ref 9–35)

## 2020-04-26 IMAGING — MR MR PELVIS W/O CM
7 of 8 series · 40 of 48 positions shown · non-contrast
Comparison: None.

CLINICAL DATA: Question of osteomyelitis of the pelvis, right
labial wound

EXAM:
MRI PELVIS WITHOUT CONTRAST
TECHNIQUE: Multiplanar multisequence MR imaging of the pelvis was performed. No
intravenous contrast was administered.

[Series 2: T2 · coronal · 5.0mm · 1.41mm/px · 4 of 36 slices shown (1 of 3)]
[im 1/36]
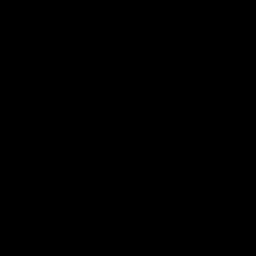
[im 12/36]
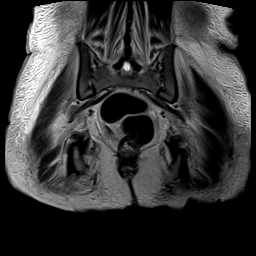
[im 24/36]
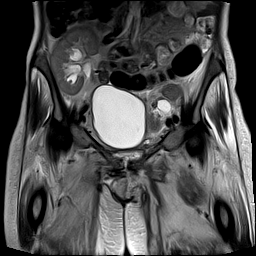
[im 36/36]
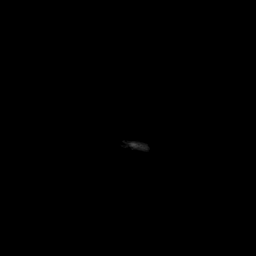

[Series 3: T2 · sagittal · 5.0mm · 0.83mm/px · 6 of 38 slices shown (2 of 3)]
[im 1/38]
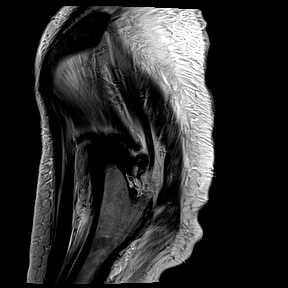
[im 8/38]
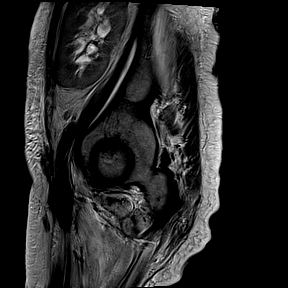
[im 15/38]
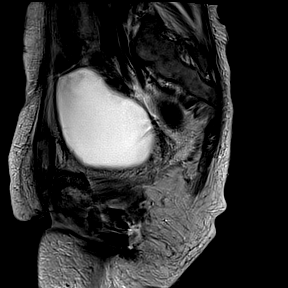
[im 23/38]
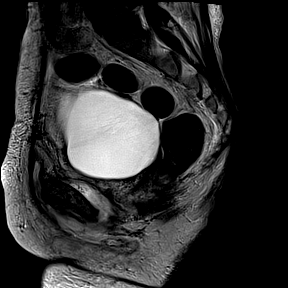
[im 30/38]
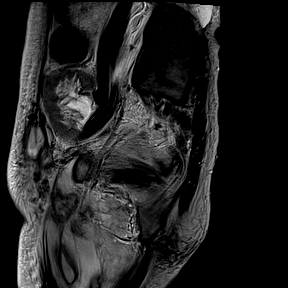
[im 38/38]
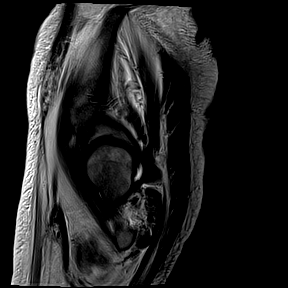

[Series 4: T2 fat-sat · sagittal · 4.0mm · 0.85mm/px · 6 of 40 slices shown (1 of 2)]
[im 1/40]
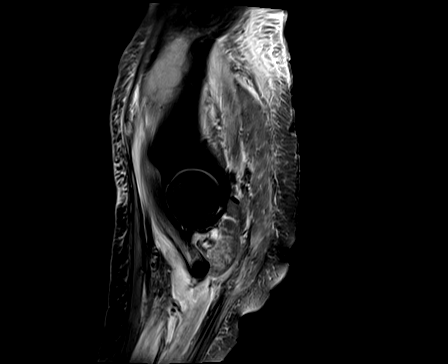
[im 8/40]
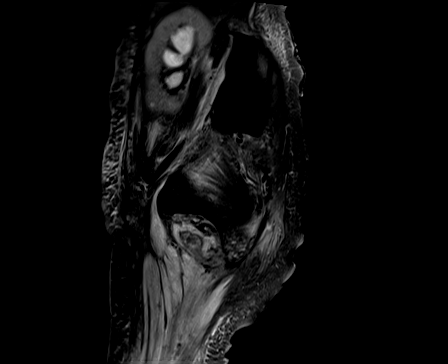
[im 16/40]
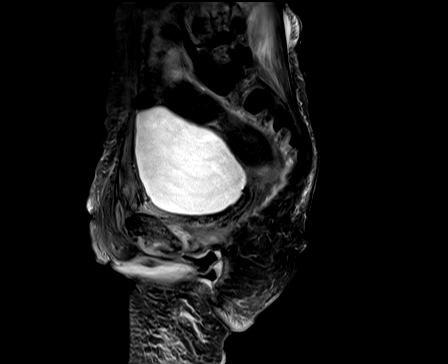
[im 24/40]
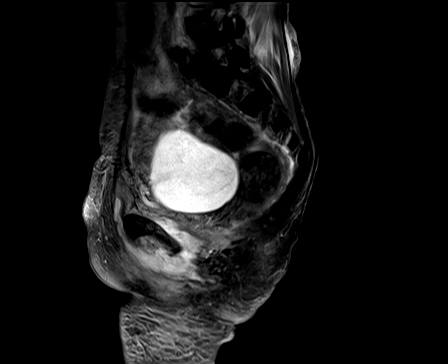
[im 32/40]
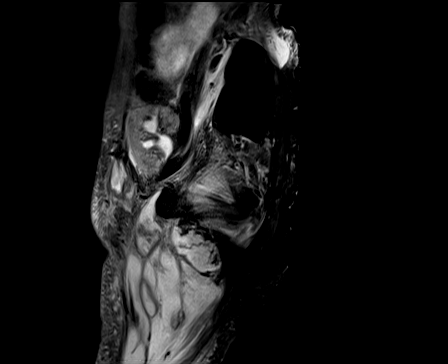
[im 40/40]
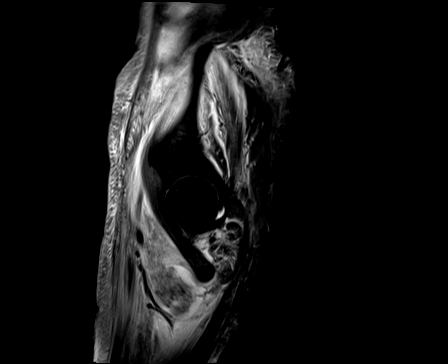

[Series 5: T2 · axial · 5.0mm · 0.97mm/px · z∈[-178,+48]mm · 6 of 39 slices shown (3 of 3)]
[im 1/39]
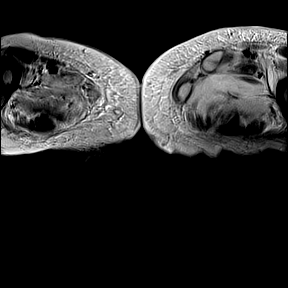
[im 8/39]
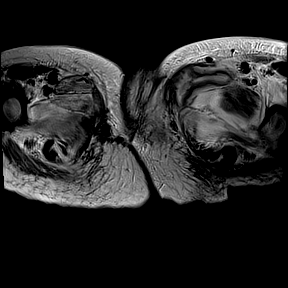
[im 16/39]
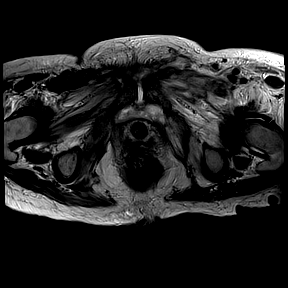
[im 23/39]
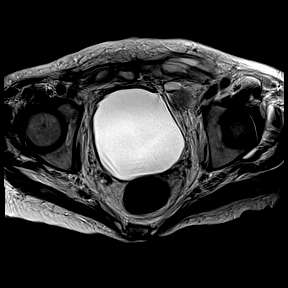
[im 31/39]
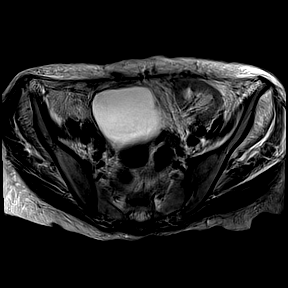
[im 39/39]
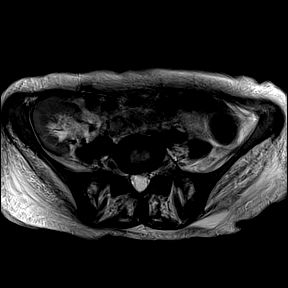

[Series 6: T1 · axial · 4.0mm · 0.74mm/px · z∈[-170,+43]mm · 7 of 44 slices shown]
[im 1/44]
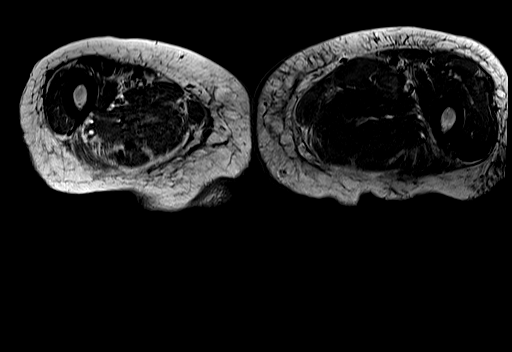
[im 8/44]
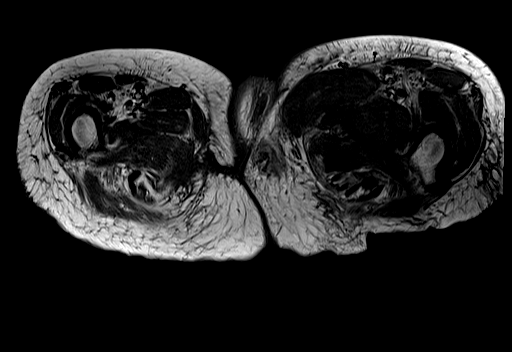
[im 15/44]
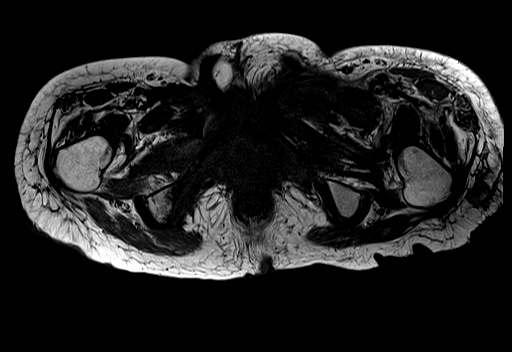
[im 22/44]
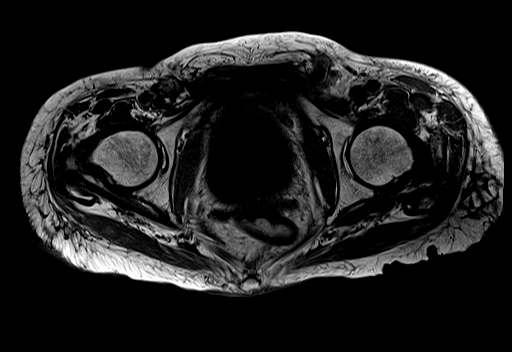
[im 29/44]
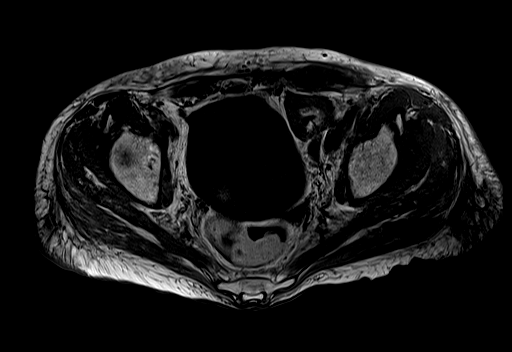
[im 36/44]
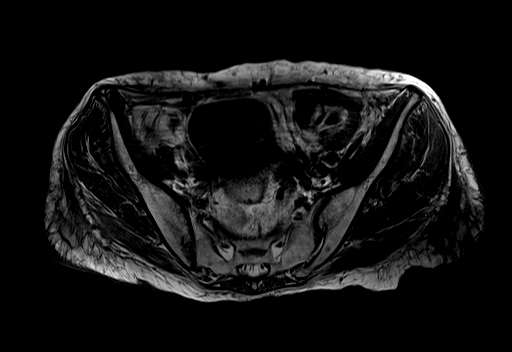
[im 44/44]
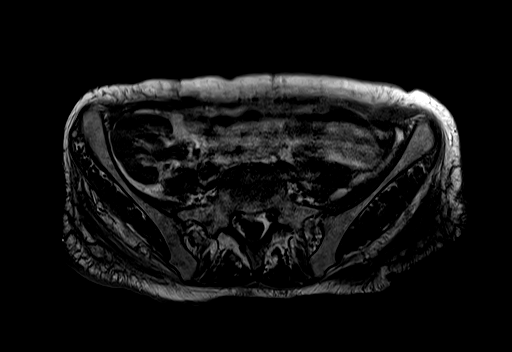

[Series 7: T2 fat-sat · axial · 5.0mm · 1.09mm/px · z∈[-172,+42]mm · 6 of 37 slices shown (2 of 2)]
[im 1/37]
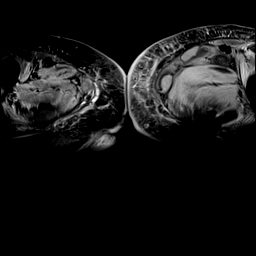
[im 8/37]
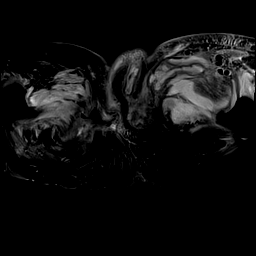
[im 15/37]
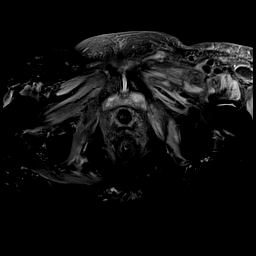
[im 22/37]
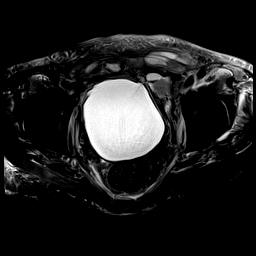
[im 29/37]
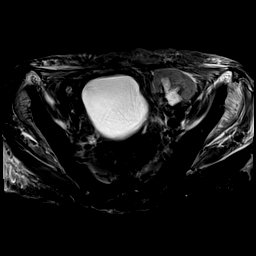
[im 37/37]
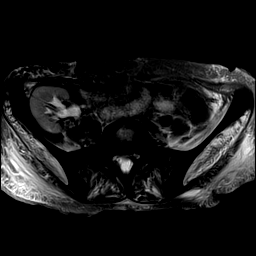

[Series 8: STIR · coronal · 4.0mm · 1.04mm/px · 5 of 44 slices shown]
[im 1/44]
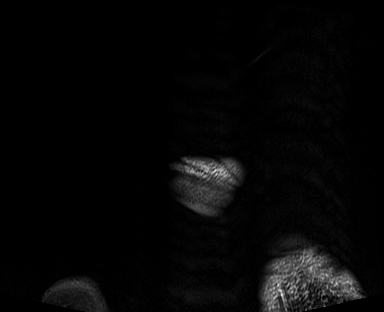
[im 8/44]
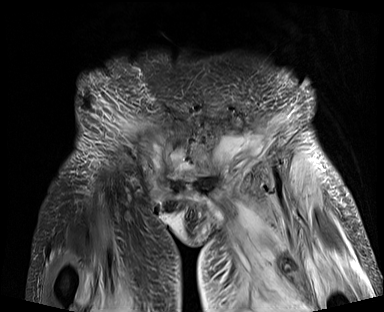
[im 15/44]
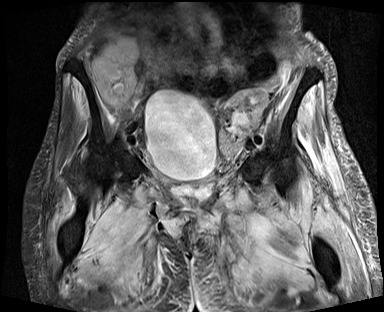
[im 22/44]
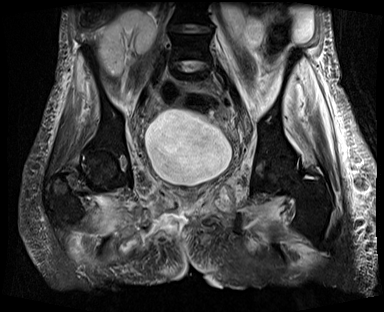
[im 29/44]
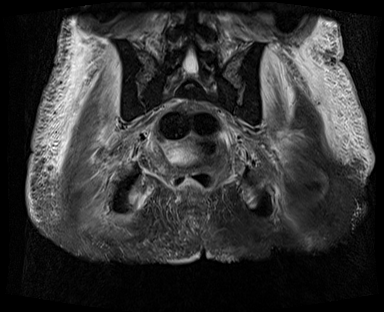

[40 of 48 positions shown; findings below may reference images not displayed]

FINDINGS: Urinary Tract: The visualized distal ureters and bladder appear
unremarkable. Bilateral low lying or pelvic kidneys are seen. Mild
bilateral pelvicaliectasis is noted.

Bowel: No bowel wall thickening, distention or surrounding
inflammation identified within the pelvis.

Vascular/Lymphatic: No enlarged pelvic lymph nodes identified. No
significant vascular findings.

Reproductive: The patient is status post hysterectomy.

Other: Along the right labial fold there is a large area of
ulceration which extends through the area of the inferior pubic rami
and extends to the adductor musculature area. There is surrounding
non loculated fluid. No loculated fluid collection is seen.

Musculoskeletal: A nondisplaced fracture seen through the inferior
right pubic rami with diffusely increased heterogeneous T2
hyperintense signal with T1 hypointensity. Small amounts the
anterior pubic symphysis. Increased T2 hyperintense signal seen at
the anterior left pubic symphysis with mottled T1 hypointensity.
Diffusely increased signal seen within the bilateral adductor
musculature. There is a heterogeneous multilocular fluid collection
extending along the anterior left inferior pubic rami and into the
anterior upper thigh measuring approximately 7 cm in craniocaudad
dimension. There is overlying subcutaneous edema and skin thickening
is noted left greater right.
IMPRESSION: 1. Large area of ulceration with sinus tract seen within the right
labial fold with diffuse surrounding phlegmon and extensive myositis
in the bilateral adductor and anterior muscular compartments of both
thighs.
2. There is a multilocular fluid collection extending in the
inferior left pelvic soft tissues surrounding the pubic rami and
extending to the anterior upper left thigh, consistent with a
multilocular abscess.
3. Nondisplaced fracture of the inferior right pubic rami with
findings suggestive osteomyelitis involving the pubic rami and
anterior pubic symphysis.
4. Findings suggestive of osteomyelitis involving the anterior left
pubic symphysis.

## 2020-04-26 MED ORDER — BOOST / RESOURCE BREEZE PO LIQD CUSTOM
1.0000 | Freq: Three times a day (TID) | ORAL | Status: DC
  Administered 2020-04-27 – 2020-04-30 (×5): 1 via ORAL

## 2020-04-26 MED ORDER — ONDANSETRON HCL 4 MG/2ML IJ SOLN
4.0000 mg | Freq: Once | INTRAMUSCULAR | Status: DC
  Filled 2020-04-26: qty 2

## 2020-04-26 MED ORDER — PROSOURCE PLUS PO LIQD
30.0000 mL | Freq: Three times a day (TID) | ORAL | Status: DC
  Administered 2020-04-27 – 2020-05-01 (×5): 30 mL via ORAL
  Filled 2020-04-26 (×14): qty 30

## 2020-04-26 MED ORDER — ONDANSETRON HCL 4 MG/2ML IJ SOLN: 4.0000 mg | Freq: Four times a day (QID) | INTRAMUSCULAR | Status: DC | PRN

## 2020-04-26 NOTE — Progress Notes (Addendum)
HD#6 Subjective:   No acute events overnight.   RN overnight reported increased drainage from vulvar wound.   Patient states she changed her dressing this morning. States she has not noticed increased drainage. Does not recall concern for osteomyelitis. Reports improvement of lower extremity pain  This morning, patient reports after using the restroom this morning she experienced "seizure like activity," where she had trouble controlling jerking movements of her arms. She is concerned about having a fall  Objective:  Vital signs in last 24 hours: Vitals:   04/25/20 1215 04/25/20 1814 04/25/20 2316 04/26/20 0444  BP: (!) 135/100 (!) 126/91 (!) 145/97 125/86  Pulse: 95 93 85 93  Resp: 18 19 18 20   Temp: 98.7 F (37.1 C) 98.3 F (36.8 C) 97.9 F (36.6 C) 98.5 F (36.9 C)  TempSrc: Oral Oral Oral Oral  SpO2: 98% 98% 99% 99%  Weight:    50.6 kg  Height:       Supplemental O2: Room Air SpO2: 99 %  Constitutional: ill appearing, thin, sitting in bed, in no acute distress Cardiovascular: regular rate and rhythm Pulmonary/Chest: normal work of breathing on room air Abdominal:  non-distended Neurological: alert & oriented x 3, 5/5 strength in bilateral upper and lower extremities, normal gait Skin: Dry, poor skin turgor  Psych: alert and oriented   Filed Weights   04/24/20 0856 04/25/20 0417 04/26/20 0444  Weight: 49.1 kg 50.8 kg 50.6 kg     Intake/Output Summary (Last 24 hours) at 04/26/2020 0636 Last data filed at 04/26/2020 0448 Gross per 24 hour  Intake 120 ml  Output --  Net 120 ml   Net IO Since Admission: 2,015.72 mL [04/26/20 0636]  Pertinent Labs: CBC Latest Ref Rng & Units 04/25/2020 04/24/2020 04/24/2020  WBC 4.0 - 10.5 K/uL 8.8 8.4 7.7  Hemoglobin 12.0 - 15.0 g/dL 8.8(L) 9.3(L) 8.6(L)  Hematocrit 36.0 - 46.0 % 27.2(L) 30.7(L) 27.1(L)  Platelets 150 - 400 K/uL 94(L) 89(L) 93(L)    CMP Latest Ref Rng & Units 04/25/2020 04/24/2020 04/23/2020  Glucose 70 -  99 mg/dL 81 73 80  BUN 6 - 20 mg/dL 45(H) 50(H) 54(H)  Creatinine 0.44 - 1.00 mg/dL 2.81(H) 3.15(H) 3.37(H)  Sodium 135 - 145 mmol/L 133(L) 133(L) 132(L)  Potassium 3.5 - 5.1 mmol/L 3.7 3.8 3.7  Chloride 98 - 111 mmol/L 102 102 102  CO2 22 - 32 mmol/L 20(L) 21(L) 21(L)  Calcium 8.9 - 10.3 mg/dL 7.9(L) 8.1(L) 7.9(L)  Total Protein 6.5 - 8.1 g/dL - 5.3(L) 5.2(L)  Total Bilirubin 0.3 - 1.2 mg/dL - 0.9 0.7  Alkaline Phos 38 - 126 U/L - 102 109  AST 15 - 41 U/L - 14(L) 15  ALT 0 - 44 U/L - 8 9    Imaging: No results found.  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   Generalized Weakness  Multifactorial etiology; deconditioning, malnutrition, recent 2 week hospitalization, severe right labial wound and hx of cancer. Nutrition consult pending. Received 1L of LR yesterday. Had an episode of vomiting this am that patient believes was due to the eggs she ate. Will order 1L LR. Working with social  work for [employer] placement. -Daily BMP -zofran PRN -1L LR today -nutrition recommendation pending -continue to work with PT/OT -CIR consult in progress  Right Labial Soft Tissue Infection Patient remains afebrile, hemodynamically stable, and without leukocytosis. Nursing note increase drainage from wood. Prior imaging with questionable mild chronic osteomyelitis of the pelvis. ID note from [place] state end date for abx on 01/22 or longer if wound not improving. Will consult our ID physicians for further management.  -Follow wound care recommendations -completed course of levofloxacin and metronidazole on 01/22  Left Upper Leg Pain Patient with left upper thigh pain that has  improved. -muscle rub PRN -tylenol 100 mg TID scheduled -PT/OT  Shaking Episodes Patient reports episodes of shaking in her arms earlier this am while on the toilet. She notes this was similar to the episode she had PTA where she was unable to control her am movement. Consistent with nonepileptic seizure, patient without loss of consciousness. Because this occurred after standing after using the bathroom, possible syncopal like episode. Does not appear encephalopathic upon our exam.  -will continue to monitor.  Esophageal Candidiasis  Continue oral fluconazole -plan to check tacrolimus level tomorrow  Normocytic Anemia Hx of Iron Deficiency Anemia Hx of Femoral Vein DVT Hgb 8.8>8.2. EGD with no clear etiology for blood loss. Suspect secondary to chronic wound, malignancy, and malnutrition. Patient to be switched back to oral anticoagulation today per pharmacy.  -daily CBC -transfuse if under 7  Nausea/Vomiting Patient with episode of emesis this am, appears to be secondary to the eggs she ate. Able to tolerate milk and banana. -PRN zofran -Continue to monitor -Renal diet -QTc normal at this time, if nauseated will add anti-emetic   Stage 3C Vulvar Cancer Patient completed treatment at this time. Follows with gyn/onc at [facility]. Evaluated  by Palliative for her chronic medical conditions. Patient's goal documented to be hopeful for continuing treatment options to prolong her quality of life. Would like to remain full code, however, DNR/DNI was discussed between palliative care and the patient and her family. -continue goals of care discussions.  -follow up with gyn/onc on outpatient basis  Kidney Failure s/p kidney transplant 2001 and 2014 Cr improving with increased PO intake. Nephro document renal function overall stable and to continue current transplant medications. They will sign off at this time.  Hx of Femoral Vein DVT -continue home apixaban  Diet: Renal IVF:  None,None VTE: None in setting of anemia Code: Full  Dispo: Anticipated discharge pending CIR placement [place] [facility] Resident PGY-1 Pager [phone] Please contact the on call pager after 5 pm and on weekends at [phone].

## 2020-04-26 NOTE — Consult Note (Signed)
[facility] for Infectious Disease    Date of Admission:  04/20/2020     Total days of antibiotics 7  Levaquin + Flagyl therapy completed 04/24/20                Reason for Consult: ?osteomyelitis     Referring Provider: Heber Manzanita & IMT team  Primary Care Provider: Bonnita Nasuti, MD    Assessment: Christina Mack is a 55 y.o. female on immunosuppression for kidney transplant here with large chronic deep ulceration of the right labia and upper inner thigh fold. This has eroded deeply since the end of her radiation therapy.  Became superinfected recently complicated by bacteremia due to bacteroides and eggerthella species.   She is now here after 3 weeks of recommended antibiotics with improvement in drainage of wound but evaluation of involuntary movements and weakness.   There is also a question of osteomyelitis of the R ramus. She does not report any pain in the right leg but has chronic left hip pain -- however on exam she has limited external rotation of the right hip she sites due to tight skin from radiation changes. I think MRI would be more helpful to determine changes to the bone and deeper structures here. Will plan a pelvic MRI w/o contrast. Given the slough present on the wound it would be most helpful to have surgery team see her, if anything for a better exam under anesthesia and mechanical debridement of mounded non-viable slough. Seems too deep for topical debridement in my opinion. Unable to probe for bone due to pain and discomfort. Painful appearing with chronic changes to skin from radiation and erythema to peri-wound.   Will hold further antibiotics - unclear what these shaking episodes are; they seem reproducible/triggered so unlikely seizures but chance she could be having rigors; will check blood cultures to assess for bacteremia.    Plan: 1. Would get MRI of the pelvis w/o contrast  2. Agree with GYN surgery vs general surgery pending #1 given depth and  complex structures involved 3. CRP / ESR in AM 4. Blood cultures  5. Hold antibiotics for now    Active Problems:   Weakness   Pressure injury of skin   . sodium chloride   Intravenous Once  . acetaminophen  1,000 mg Oral TID  . apixaban  2.5 mg Oral BID  . feeding supplement (NEPRO CARB STEADY)  237 mL Oral TID BM  . fluconazole  100 mg Oral Daily  . magnesium oxide  400 mg Oral BID  . methadone  2.5 mg Oral Q12H  . ondansetron (ZOFRAN) IV  4 mg Intravenous Once  . pantoprazole  40 mg Oral BID AC  . predniSONE  5 mg Oral Q breakfast  . tacrolimus  0.5 mg Oral BID    HPI: Christina Mack is a 55 y.o. female admitted from home via EMS transport for weakness, nausea/vomiting and shaking/syncopal episodes at home.   History of renal transplant (s/p failed transplant 2001 and second in 2014) on prednisone & prograf), stage IIIc vulvar cancer s/p resection/ablation and chemoradiation at [facility] 2010 and again 11/2019 - 01/2020; R femoral DVT, HTN.   Referred to GYN Onc 2021 to evaluate inflamed vulvar lesion that Palma describes to have started as a bump that "came and went" - PET scan revealed large hypermetabolic mass in R side of perineum c/w known vulvar malignancy. She was started again on chemoradiation in August,  which was complicated by DVT and AKI. She completed pelvic radiation 01/15/2020. She noticed this waxing and waning bump during radiation that developed into an open ulceration surrounded by darkened thickened skin throughout radiation. She was trying to be seen by her GYN ONC team and then unfortunately she says she had COVID which delayed any in person evaluation  She was eventually hospitalized at [facility] 04/04/20--1/15 for large draining wound overlying R labia and inner thigh. 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 Surgical consultants did not believe this was c/w necrotizing infection and no surgery was performed. She was bacteremic at that time with bacteroides ovatus and eggerthella species. She was seen by ID at [facility] and recommended tx with zosyn alone --> this was changed to Levaquin + metronidazole due to concern over myelosuppression (thrombocytopenia) from beta lactam therapy.   She states she has had ongoing nausea and vomiting throughout antibiotic use -- specifically the flagyl was unfriendly to her. She has lost a lot of weight since August given her multiple health problems. Only spent 2 days at home before her family had to call EMS due to shaking, involuntary whole body movements that are new for her. She has never had any syncopal episodes but reports that she notices these episodes when she stands up and rest relieves them. She declined EEG.  She has been doing her own dressing changes at home with colloid dressing. She has not seen it directly but can verify she has had a significant improvement in drainage - at one point it would drain out onto a chuck underneath her during changes.  Wound consultation obtained - groin wound has large yellow exudate and is quite deep.also recommended further consultation with surgery team given strong odor and pain.    Review of Systems: Review of Systems  Constitutional: Positive for malaise/fatigue and weight loss. Negative for chills, diaphoresis and fever.  Cardiovascular: Negative for chest pain and palpitations.  Skin: Negative for rash.    Past Medical History:  Diagnosis Date  . Complication of anesthesia    nausea and vomiting    Social History   Tobacco Use  . Smoking status: Never Smoker  . Smokeless tobacco: Never Used    History reviewed. No pertinent family history. Allergies  Allergen Reactions  . Codeine     vomiting  . Hydromorphone Nausea And Vomiting  .  Oxycodone-Acetaminophen Itching, Nausea Only, Other (See Comments) and Nausea And Vomiting    Other Reaction: HA    OBJECTIVE: Blood pressure 133/87, pulse 98, temperature 98.7 F (37.1 C), temperature source Oral, resp. rate 16, height 5' 5" (1.651 m), weight 50.6 kg, SpO2 98 %.  Physical Exam Vitals reviewed.  Constitutional:      Comments: Thin appearing female resting in bed.   HENT:     Mouth/Throat:     Mouth: Mucous membranes are dry.  Eyes:     General: No scleral icterus.    Pupils: Pupils are equal, round, and reactive to light.  Cardiovascular:     Rate and Rhythm: Normal rate and regular rhythm.  Pulmonary:     Effort: Pulmonary effort is normal.     Breath sounds: Normal breath sounds.  Genitourinary:    Comments: Large ulceration noted as pictured below.  Skin:    General: Skin is  warm and dry.     Capillary Refill: Capillary refill takes less than 2 seconds.      Neurological:     Mental Status: She is oriented to person, place, and time.     Lab Results Lab Results  Component Value Date   WBC 9.3 04/26/2020   HGB 8.2 (L) 04/26/2020   HCT 26.6 (L) 04/26/2020   MCV 89.6 04/26/2020   PLT 85 (L) 04/26/2020    Lab Results  Component Value Date   CREATININE 2.86 (H) 04/26/2020   BUN 42 (H) 04/26/2020   NA 131 (L) 04/26/2020   K 4.4 04/26/2020   CL 99 04/26/2020   CO2 23 04/26/2020    Lab Results  Component Value Date   ALT 8 04/24/2020   AST 14 (L) 04/24/2020   ALKPHOS 102 04/24/2020   BILITOT 0.9 04/24/2020     Microbiology: Recent Results (from the past 240 hour(s))  Resp Panel by RT-PCR (Flu A&B, Covid) Nasopharyngeal Swab     Status: None   Collection Time: 04/20/20  5:12 PM   Specimen: Nasopharyngeal Swab; Nasopharyngeal(NP) swabs in vial transport medium  Result Value Ref Range Status   SARS Coronavirus 2 by RT PCR NEGATIVE NEGATIVE Final    Comment: (NOTE) SARS-CoV-2 target nucleic acids are NOT DETECTED.  The SARS-CoV-2 RNA is  generally detectable in upper respiratory specimens during the acute phase of infection. The lowest concentration of SARS-CoV-2 viral copies this assay can detect is 138 copies/mL. A negative result does not preclude SARS-Cov-2 infection and should not be used as the sole basis for treatment or other patient management decisions. A negative result may occur with  improper specimen collection/handling, submission of specimen other than nasopharyngeal swab, presence of viral mutation(s) within the areas targeted by this assay, and inadequate number of viral copies(<138 copies/mL). A negative result must be combined with clinical observations, patient history, and epidemiological information. The expected result is Negative.  Fact Sheet for Patients:  EntrepreneurPulse.com.au  Fact Sheet for Healthcare Providers:  IncredibleEmployment.be  This test is no t yet approved or cleared by the [place] FDA and  has been authorized for detection and/or diagnosis of SARS-CoV-2 by FDA under an Emergency Use Authorization (EUA). This EUA will remain  in effect (meaning this test can be used) for the duration of the COVID-19 declaration under Section 564(b)(1) of the Act, 21 U.S.C.section 360bbb-3(b)(1), unless the authorization is terminated  or revoked sooner.       Influenza A by PCR NEGATIVE NEGATIVE Final   Influenza B by PCR NEGATIVE NEGATIVE Final    Comment: (NOTE) The Xpert Xpress SARS-CoV-2/FLU/RSV plus assay is intended as an aid in the diagnosis of influenza from Nasopharyngeal swab specimens and should not be used as a sole basis for treatment. Nasal washings and aspirates are unacceptable for Xpert Xpress SARS-CoV-2/FLU/RSV testing.  Fact Sheet for Patients: EntrepreneurPulse.com.au  Fact Sheet for Healthcare Providers: IncredibleEmployment.be  This test is not yet approved or cleared by the [place] FDA and has been authorized for detection and/or diagnosis of SARS-CoV-2 by FDA under an Emergency Use Authorization (EUA). This EUA will remain in effect (meaning this test can be used) for the duration of the COVID-19 declaration under Section 564(b)(1) of the Act, 21 U.S.C. section 360bbb-3(b)(1), unless the authorization is terminated or revoked.  Performed at [facility], [place] [address], [place], [place] [address]     [name], MSN, NP-C [facility] for Infectious Disease [facility] Cell: [phone] Pager: [phone]  04/26/2020 2:53 PM

## 2020-04-26 NOTE — Progress Notes (Signed)
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 Of note pt with similar symptoms and presented to [facility] ED where she also developed AKI and a large deep wound of her right labia and inner thigh. Imaging was performed which revealed large right perineal mass with new extension into surrounding soft tissue with stranding and gas. No abscess collection noted. She was evaluated by gen surg and gyn/oncology who recommended IV abx alone. Pt now admitted for further work up. CT head with no acute findings.    PT Comments    Pt is on side of bed and fearful to stand, but is able to scoot well up the side of the bed.  Pt is requiring help to do ROM to legs, although again she is able to move and could potentially do more independent work.  Follow to encourage OOB to chair and to stand with help, and work toward more confidence with all balance skills.   Follow Up Recommendations  Supervision for mobility/OOB;CIR     Equipment Recommendations  Rolling walker with 5" wheels    Recommendations for Other Services Rehab consult     Precautions / Restrictions Precautions Precautions: Fall Precaution Comments: recent falls Restrictions Weight Bearing Restrictions: No    Mobility  Bed Mobility Overal bed mobility: Needs Assistance Bed Mobility: Supine to Sit;Sit to Supine   Sidelying to sit: Min assist Supine to sit: Min assist        Transfers Overall transfer level: Needs assistance Equipment used: None Transfers: Lateral/Scoot Transfers          Lateral/Scoot Transfers: Min assist    Ambulation/Gait                 Stairs              Wheelchair Mobility    Modified Rankin (Stroke Patients Only)       Balance Overall balance assessment: Needs assistance Sitting-balance support: Feet supported Sitting balance-Leahy Scale: Fair                                      Cognition   Behavior During Therapy: Flat affect;WFL for tasks assessed/performed Overall Cognitive Status: Within Functional Limits for tasks assessed                                 General Comments: Pt cannot tolerate standing, cannot get off the bed.  States three people have to help her to [facility]      Exercises General Exercises - Lower Extremity Ankle Circles/Pumps: AAROM;5 reps Heel Slides: AAROM;10 reps Hip ABduction/ADduction: AAROM;10 reps Hip Flexion/Marching: AAROM;10 reps    General Comments General comments (skin integrity, edema, etc.): Pt is able to stand up physically but is afraid of seizure type activity causing  fall      Pertinent Vitals/Pain Pain Assessment: Faces Faces Pain Scale: Hurts little more Pain Location: L hip while laying on R side for dressing change was grimacing and rubbing it Pain Descriptors / Indicators: Grimacing Pain Intervention(s): Repositioned  Home Living                      Prior Function            PT Goals (current goals can now be found in the care plan section) Acute Rehab PT Goals Patient Stated Goal: get home Progress towards PT goals: Progressing toward goals    Frequency    Min 3X/week      PT Plan Current plan remains appropriate    Co-evaluation              AM-PAC PT "6 Clicks" Mobility   Outcome Measure  Help needed turning from your back to your side while in a flat bed without using bedrails?: A Little Help needed moving from lying on your back to sitting on the side of a flat bed without using bedrails?: A Lot Help needed moving to and from a bed to a chair (including a wheelchair)?: A Lot Help needed  standing up from a chair using your arms (e.g., wheelchair or bedside chair)?: A Lot Help needed to walk in hospital room?: A Lot   6 Click Score: 11    End of Session Equipment Utilized During Treatment: Gait belt Activity Tolerance:  (anxiety) Patient left: in bed Nurse Communication: Mobility status PT Visit Diagnosis: Unsteadiness on feet (R26.81)     Time: 2229-7989 PT Time Calculation (min) (ACUTE ONLY): 24 min  Charges:  $Therapeutic Exercise: 8-22 mins $Therapeutic Activity: 8-22 mins         Ramond Dial 04/26/2020, 9:43 PM  Mee Hives, PT MS Acute Rehab Dept. Number: [place] [facility] [place]

## 2020-04-26 NOTE — Progress Notes (Signed)
Patient refused dressing change. She states " I have been doing the dressing change myself, place the supplies here and I will change my dressing" . Supplies given to patient and Dr Johnney Ou made aware.

## 2020-04-26 NOTE — Progress Notes (Signed)
Notified by NT patient had 1 epidote of N/V. Went to check up on her and found her eating her breakfast, ginger ale given and on call provider paged.

## 2020-04-26 NOTE — Progress Notes (Signed)
OT Cancellation Note  Patient Details Name: Christina Mack MRN: [id number] DOB: Jul 23, 1965   Cancelled Treatment:    Reason Eval/Treat Not Completed: Patient at procedure or test/ unavailable;Other (comment) Pt talking with MD upon arrival, will check back as time allows.   Harley Alto., COTA/L Acute Rehabilitation Services [phone]   Precious Haws 04/26/2020, 3:46 PM

## 2020-04-26 NOTE — Progress Notes (Signed)
Inpatient Rehabilitation-Admissions Coordinator   Still waiting to hear from insurance regarding CIR determination. Faxed updated clinicals.    Raechel Ache, OTR/L  Rehab Admissions Coordinator  [phone] 04/26/2020 12:45 PM

## 2020-04-26 NOTE — Progress Notes (Signed)
Nutrition Follow-up  DOCUMENTATION CODES:   Non-severe (moderate) malnutrition in context of chronic illness  INTERVENTION:  Provide Boost Breeze po TID, each supplement provides 250 kcal and 9 grams of protein.  Provide 30 ml Prosource plus po TID, each supplement provides 100 kcal and 15 grams of protein.   Encourage adequate PO intake.   NUTRITION DIAGNOSIS:   Moderate Malnutrition related to cancer and cancer related treatments,chronic illness as evidenced by moderate fat depletion,severe muscle depletion; ongoing  GOAL:   Patient will meet greater than or equal to 90% of their needs; progressing  MONITOR:   PO intake,Supplement acceptance,Skin,Weight trends,Labs,I & O's  REASON FOR ASSESSMENT:   Consult Assessment of nutrition requirement/status,Wound healing  ASSESSMENT:   55 y/o F with a PMHx of Stage IIIC Vulvar Cancer s/p chemoradiation, CKD s/p renal transplant (2001, 2014), HTN, HLD, presents with weakness, n/v, and seizure like activity S/p EGD 1/22.   Pt reports no abdominal pains during time of visit and ready for lunch meal. Meal completion has been mostly 50%. Pt currently has Nepro shake ordered and reports dislike of taste and texture. RD to order Boost Breeze and Prosource Plus instead to aid in caloric and protein needs. Pt does report consuming Carnation instant breakfast at home at least once daily. RD to additionally order carnation instant breakfast. Pt encouraged to eat her food at meals and to drink her supplements.   NUTRITION - FOCUSED PHYSICAL EXAM:  Flowsheet Row Most Recent Value  Orbital Region Unable to assess  Upper Arm Region Moderate depletion  Thoracic and Lumbar Region Moderate depletion  Buccal Region Unable to assess  Temple Region Unable to assess  Clavicle Bone Region Severe depletion  Clavicle and Acromion Bone Region Severe depletion  Scapular Bone Region Unable to assess  Dorsal Hand Unable to assess  Patellar Region Mild  depletion  Anterior Thigh Region Moderate depletion  Posterior Calf Region Mild depletion  Edema (RD Assessment) None  Hair Reviewed  Eyes Reviewed  Mouth Reviewed  Skin Reviewed  Nails Reviewed     Labs and medications reviewed.   Diet Order:   Diet Order            Diet regular Room service appropriate? Yes; Fluid consistency: Thin  Diet effective now                 EDUCATION NEEDS:   Not appropriate for education at this time  Skin:  Skin Assessment: Skin Integrity Issues: Skin Integrity Issues:: Stage II Stage II: buttocks  Last BM:  1/18  Height:   Ht Readings from Last 1 Encounters:  04/24/20 5\' 5"  (1.651 m)    Weight:   Wt Readings from Last 1 Encounters:  04/26/20 50.6 kg    Ideal Body Weight:  56.8 kg  BMI:  Body mass index is 18.56 kg/m.  Estimated Nutritional Needs:   Kcal:  1750-2000  Protein:  85-100 grams  Fluid:  1.2 L/day  Corrin Parker, MS, RD, LDN RD pager number/after hours weekend pager number on Amion.

## 2020-04-27 ENCOUNTER — Other Ambulatory Visit: Payer: Self-pay | Admitting: Physician Assistant

## 2020-04-27 DIAGNOSIS — M8618 Other acute osteomyelitis, other site: Secondary | ICD-10-CM

## 2020-04-27 DIAGNOSIS — K651 Peritoneal abscess: Secondary | ICD-10-CM | POA: Diagnosis not present

## 2020-04-27 DIAGNOSIS — C519 Malignant neoplasm of vulva, unspecified: Secondary | ICD-10-CM | POA: Diagnosis not present

## 2020-04-27 DIAGNOSIS — N184 Chronic kidney disease, stage 4 (severe): Secondary | ICD-10-CM | POA: Diagnosis not present

## 2020-04-27 LAB — BASIC METABOLIC PANEL
Anion gap: 10 (ref 5–15)
BUN: 46 mg/dL — ABNORMAL HIGH (ref 6–20)
CO2: 21 mmol/L — ABNORMAL LOW (ref 22–32)
Calcium: 8.2 mg/dL — ABNORMAL LOW (ref 8.9–10.3)
Chloride: 99 mmol/L (ref 98–111)
Creatinine, Ser: 2.99 mg/dL — ABNORMAL HIGH (ref 0.44–1.00)
GFR, Estimated: 18 mL/min — ABNORMAL LOW (ref >60)
Glucose, Bld: 78 mg/dL (ref 70–99)
Potassium: 3.9 mmol/L (ref 3.5–5.1)
Sodium: 130 mmol/L — ABNORMAL LOW (ref 135–145)

## 2020-04-27 LAB — CBC
HCT: 25.4 % — ABNORMAL LOW (ref 36.0–46.0)
Hemoglobin: 7.9 g/dL — ABNORMAL LOW (ref 12.0–15.0)
MCH: 27.6 pg (ref 26.0–34.0)
MCHC: 31.1 g/dL (ref 30.0–36.0)
MCV: 88.8 fL (ref 80.0–100.0)
Platelets: 87 10*3/uL — ABNORMAL LOW (ref 150–400)
RBC: 2.86 MIL/uL — ABNORMAL LOW (ref 3.87–5.11)
RDW: 15.6 % — ABNORMAL HIGH (ref 11.5–15.5)
WBC: 8.8 10*3/uL (ref 4.0–10.5)
nRBC: 0 % (ref 0.0–0.2)

## 2020-04-27 LAB — C-REACTIVE PROTEIN: CRP: 5.4 mg/dL — ABNORMAL HIGH (ref <1.0)

## 2020-04-27 LAB — SAVE SMEAR(SSMR), FOR PROVIDER SLIDE REVIEW

## 2020-04-27 LAB — SURGICAL PATHOLOGY

## 2020-04-27 LAB — SEDIMENTATION RATE: Sed Rate: 73 mm/hr — ABNORMAL HIGH (ref 0–22)

## 2020-04-27 NOTE — Progress Notes (Signed)
[facility] for Infectious Disease  Date of Admission:  04/20/2020      Total days of antibiotics Levaquin + flagyl through 1/22 (3 weeks)           ASSESSMENT: Christina Mack is a 55 y.o. female on immunosuppression for kidney transplant here with large chronic deep ulceration of the right labia and upper inner thigh fold. This has eroded deeply since the end of her radiation therapy.  Became superinfected recently complicated by bacteremia due to bacteroides and eggerthella species.   MRI of pelvis reveals large ulceration of R labial fold with sinus tract and diffuse phlegmon and extensive myositis in bilateral adductor and anterior muscular compartments of both thighs. Multilocular fluid collection extending in to the inferior left pelvic soft tissues surrounding the pubic rami and extending up to anterior thigh c/w abscess. Nondisplaced fractures of the inferior R pubic rami with findings suggestive of osteomyelitis and anterior pubic symphysis. Osteo suggested in the anterior left pubic symphysis.   She may be best served transferring to either home center familiar with her care needs or tertiary center given complexity. Not likely that will happen at this time with COVID numbers in surrounding hospitals.   Would start with consultation of GYN Onc given history of VINIII. Likely will need multidisciplinary surgical approach however given structures involved. ?orthopedics vs plastics vs general. Fluid collection involving the left pelvic soft tissues may be amenable to percutaneous drainage. If this is the case would send for cultures to get new samples for guiding further therapies given allergies to antibiotics.     PLAN: 1. Hold on antibiotics for now --> she knows she will need longer course of treatment given bone involvement  2. GYN Surgery to consult and help guide further recs for surgery care 3. ?IR to eval the fluid collection referenced in MRI to drain and get  new cultures from     Active Problems:   Weakness   Pressure ulcer of coccygeal region, stage 2 (HCC)   Thrombocytopenia (HCC)   Kidney transplant status   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([place])   Esophageal candidiasis (HCC)   Normocytic anemia   CKD (chronic kidney disease) stage 4, GFR 15-29 ml/min (HCC)   Wound of right groin   Gastric ulcer without hemorrhage or perforation   Malnutrition of moderate degree   Acute osteomyelitis of right pelvic region and thigh ([place])   . (feeding supplement) PROSource Plus  30 mL Oral TID BM  . sodium chloride   Intravenous Once  . acetaminophen  1,000 mg Oral TID  . apixaban  2.5 mg Oral BID  . feeding supplement  1 Container Oral TID BM  . fluconazole  100 mg Oral Daily  . magnesium oxide  400 mg Oral BID  . methadone  2.5 mg Oral Q12H  . ondansetron (ZOFRAN) IV  4 mg Intravenous Once  . pantoprazole  40 mg Oral BID AC  . predniSONE  5 mg Oral Q breakfast  . tacrolimus  0.5 mg Oral BID    SUBJECTIVE: Had a better night sleep last night. No new complaints.    Review of Systems: Review of Systems  Constitutional: Negative for chills and fever.  HENT: Negative for tinnitus.   Eyes: Negative for blurred vision and photophobia.  Respiratory: Negative for cough and sputum production.   Cardiovascular: Negative for chest pain.  Gastrointestinal: Negative for diarrhea, nausea and vomiting.  Genitourinary: Negative for dysuria.  Musculoskeletal: Positive  for back pain and joint pain.  Skin: Negative for rash.  Neurological: Negative for headaches.    Allergies  Allergen Reactions  . Codeine     vomiting  . Hydromorphone Nausea And Vomiting  . Oxycodone-Acetaminophen Itching, Nausea Only, Other (See Comments) and Nausea And Vomiting    Other Reaction: HA    OBJECTIVE: Vitals:   04/26/20 1842 04/26/20 2315 04/27/20 0451 04/27/20 1222  BP: 121/84 (!) 139/99 (!) 135/99 132/90  Pulse: 97 88 94 98  Resp: 16 19 16 16   Temp: 98.4 F (36.9 C)  98.5 F (36.9 C) 98.6 F (37 C) 98.3 F (36.8 C)  TempSrc: Oral Oral Oral Oral  SpO2: 99% 98% 100% 99%  Weight:   51.1 kg   Height:       Body mass index is 18.75 kg/m.  Physical Exam Constitutional:      Appearance: Normal appearance. She is ill-appearing.     Comments: Thin appearing, resting comfortably in bed.   HENT:     Mouth/Throat:     Mouth: Mucous membranes are moist.     Pharynx: Oropharynx is clear.  Eyes:     General: No scleral icterus. Pulmonary:     Effort: Pulmonary effort is normal.  Neurological:     Mental Status: She is alert and oriented to person, place, and time.  Psychiatric:        Mood and Affect: Mood normal.        Thought Content: Thought content normal.     Lab Results Lab Results  Component Value Date   WBC 8.8 04/27/2020   HGB 7.9 (L) 04/27/2020   HCT 25.4 (L) 04/27/2020   MCV 88.8 04/27/2020   PLT 87 (L) 04/27/2020    Lab Results  Component Value Date   CREATININE 2.99 (H) 04/27/2020   BUN 46 (H) 04/27/2020   NA 130 (L) 04/27/2020   K 3.9 04/27/2020   CL 99 04/27/2020   CO2 21 (L) 04/27/2020    Lab Results  Component Value Date   ALT 8 04/24/2020   AST 14 (L) 04/24/2020   ALKPHOS 102 04/24/2020   BILITOT 0.9 04/24/2020     Microbiology: Recent Results (from the past 240 hour(s))  Resp Panel by RT-PCR (Flu A&B, Covid) Nasopharyngeal Swab     Status: None   Collection Time: 04/20/20  5:12 PM   Specimen: Nasopharyngeal Swab; Nasopharyngeal(NP) swabs in vial transport medium  Result Value Ref Range Status   SARS Coronavirus 2 by RT PCR NEGATIVE NEGATIVE Final    Comment: (NOTE) SARS-CoV-2 target nucleic acids are NOT DETECTED.  The SARS-CoV-2 RNA is generally detectable in upper respiratory specimens during the acute phase of infection. The lowest concentration of SARS-CoV-2 viral copies this assay can detect is 138 copies/mL. A negative result does not preclude SARS-Cov-2 infection and should not be used as the  sole basis for treatment or other patient management decisions. A negative result may occur with  improper specimen collection/handling, submission of specimen other than nasopharyngeal swab, presence of viral mutation(s) within the areas targeted by this assay, and inadequate number of viral copies(<138 copies/mL). A negative result must be combined with clinical observations, patient history, and epidemiological information. The expected result is Negative.  Fact Sheet for Patients:  EntrepreneurPulse.com.au  Fact Sheet for Healthcare Providers:  IncredibleEmployment.be  This test is no t yet approved or cleared by the [place] FDA and  has been authorized for detection and/or diagnosis of SARS-CoV-2 by FDA  under an Emergency Use Authorization (EUA). This EUA will remain  in effect (meaning this test can be used) for the duration of the COVID-19 declaration under Section 564(b)(1) of the Act, 21 U.S.C.section 360bbb-3(b)(1), unless the authorization is terminated  or revoked sooner.       Influenza A by PCR NEGATIVE NEGATIVE Final   Influenza B by PCR NEGATIVE NEGATIVE Final    Comment: (NOTE) The Xpert Xpress SARS-CoV-2/FLU/RSV plus assay is intended as an aid in the diagnosis of influenza from Nasopharyngeal swab specimens and should not be used as a sole basis for treatment. Nasal washings and aspirates are unacceptable for Xpert Xpress SARS-CoV-2/FLU/RSV testing.  Fact Sheet for Patients: EntrepreneurPulse.com.au  Fact Sheet for Healthcare Providers: IncredibleEmployment.be  This test is not yet approved or cleared by the [place] FDA and has been authorized for detection and/or diagnosis of SARS-CoV-2 by FDA under an Emergency Use Authorization (EUA). This EUA will remain in effect (meaning this test can be used) for the duration of the COVID-19 declaration under Section 564(b)(1) of the  Act, 21 U.S.C. section 360bbb-3(b)(1), unless the authorization is terminated or revoked.  Performed at [facility], [place] [address], [place], [place] [address]      [name], MSN, NP-C [facility] for Infectious Disease [email or web address] Pager: [phone] Office: [phone] [place]: [phone]

## 2020-04-27 NOTE — Progress Notes (Signed)
HD#7 Subjective:   No acute events overnight.   Patient notes that she is feeling okay today and was updated on the results of her right wound MRI. She notes that she was told she would be transferred and that she would prefer care at [facility] instead of [facility]. Is amendable to seeing specialists here but would prefer to be followed by [facility] or [place]. Explained to patient that since Christina Mack, transfers have been difficult and because the patient has not been seen by [facility] in 15 years, it would be highly unlikely this would occur. Patient expresses understanding.   When asked about shaking episodes, states she has been scared to get up. Has not had episodes today. When asked about bruises on arm, she states that she feels as though they have improved.   Objective:  Vital signs in last 24 hours: Vitals:   04/26/20 1240 04/26/20 1842 04/26/20 2315 04/27/20 0451  BP: 133/87 121/84 (!) 139/99 (!) 135/99  Pulse: 98 97 88 94  Resp: 16 16 19 16   Temp: 98.7 F (37.1 C) 98.4 F (36.9 C) 98.5 F (36.9 C) 98.6 F (37 C)  TempSrc: Oral Oral Oral Oral  SpO2: 98% 99% 98% 100%  Weight:    51.1 kg  Height:       Supplemental O2: Room Air SpO2: 100 %  Constitutional: ill appearing, thin, sitting in bed, in no acute distress Cardiovascular: regular rate and rhythm Pulmonary/Chest: normal work of breathing on room air Abdominal:  non-distended Neurological: alert & oriented x 3, 5/5 strength in bilateral upper and lower extremities, normal gait Skin: Dry, poor skin turgor. Echymosis, Purpura Psych: alert and oriented  Filed Weights   04/25/20 0417 04/26/20 0444 04/27/20 0451  Weight: 50.8 kg 50.6 kg 51.1 kg     Intake/Output Summary (Last 24 hours) at 04/27/2020 9476 Last data filed at 04/27/2020 0452 Gross per 24 hour  Intake 200 ml  Output --  Net 200 ml   Net IO Since Admission: 2,335.72 mL [04/27/20 0609]  Pertinent Labs: CBC Latest Ref Rng & Units 04/27/2020 04/26/2020  04/25/2020  WBC 4.0 - 10.5 K/uL 8.8 9.3 8.8  Hemoglobin 12.0 - 15.0 g/dL 7.9(L) 8.2(L) 8.8(L)  Hematocrit 36.0 - 46.0 % 25.4(L) 26.6(L) 27.2(L)  Platelets 150 - 400 K/uL 87(L) 85(L) 94(L)    CMP Latest Ref Rng & Units 04/27/2020 04/26/2020 04/25/2020  Glucose 70 - 99 mg/dL 78 122(H) 81  BUN 6 - 20 mg/dL 46(H) 42(H) 45(H)  Creatinine 0.44 - 1.00 mg/dL 2.99(H) 2.86(H) 2.81(H)  Sodium 135 - 145 mmol/L 130(L) 131(L) 133(L)  Potassium 3.5 - 5.1 mmol/L 3.9 4.4 3.7  Chloride 98 - 111 mmol/L 99 99 102  CO2 22 - 32 mmol/L 21(L) 23 20(L)  Calcium 8.9 - 10.3 mg/dL 8.2(L) 8.1(L) 7.9(L)  Total Protein 6.5 - 8.1 g/dL - - -  Total Bilirubin 0.3 - 1.2 mg/dL - - -  Alkaline Phos 38 - 126 U/L - - -  AST 15 - 41 U/L - - -  ALT 0 - 44 U/L - - -    Imaging: MRI Pelvis W/O Contrast 1. Large area of ulceration with sinus tract seen within the right labial fold with diffuse surrounding phlegmon and extensive myositis in the bilateral adductor and anterior muscular compartments of both thighs. 2. There is a multilocular fluid collection extending in the inferior left pelvic soft tissues surrounding the pubic rami and extending to the anterior upper left thigh, consistent with a  multilocular abscess. 3. Nondisplaced fracture of the inferior right pubic rami with findings suggestive osteomyelitis involving the pubic rami and anterior pubic symphysis. 4. Findings suggestive of osteomyelitis involving the anterior left pubic symphysis.  Assessment/Plan:   Active Problems:   Weakness   Pressure ulcer of coccygeal region, stage 2 (HCC)   Thrombocytopenia (HCC)   Kidney transplant status   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([place])   Esophageal candidiasis (HCC)   Normocytic anemia   CKD (chronic kidney disease) stage 4, GFR 15-29 ml/min (HCC)   Wound of right groin   Gastric ulcer without hemorrhage or perforation   Malnutrition of moderate degree   Acute osteomyelitis of right pelvic region and thigh [facility])  Patient  Summary: Christina Mack is a 55 y/o F with a PMHx of Stage IIIC Vulvar Cancer s/p chemoradiation, CKD s/p renal transplant (2001, 2014), HTN, HLD, 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   Right Labial Soft Tissue Ulceration w/ Phlegmon/Myositis Suspected Multilocular Abscess Soft Tissue of Pelvis Nondisplaced fracture of Inferior Right Pubic Rami Suspected Osteomyelitis of Pubic rami, Anterior Pubic Symphysis MRI performed with findings per above. Wound appears to be much more extensive than believed to be, however, hard to compare to CT done at [facility] earlier this month. After discussion with attending and NP dixon, believe this need a multi-disciplinary approach. Will reach out to our specialists, but patient may ultimately need a larger tertiary center considering the extent of the wound and need for debridement. Will start by consulting Gyn/Onc and go from there. Consider IR, Ortho (for fractures), Gen Surg consults as well. May need washout of wound under anesthesia. Will hold off on antibiotics for now, patient stable off of them. Infection may be reason for patient's shaking spells. Believe it will be overall difficult for her to be transferred and patient okay with starting work up here. -Gyn/Onc consulted, pending -ID consulted, appreciated their recs -hold abx for now, stable and awaiting potential cultures by specialists -[facility] pending -discussed plan with patient/family  -follow wound care recommendations -completed course of levofloxacin and metronidazole on 01/22  Generalized Weakness Moderate Malnutrition 2/2 Malignancy/Chronic Illness Multifactorial etiology; deconditioning, malnutrition, recent 2 week hospitalization, severe right labial wound and hx of cancer. Moderate malnutrition.  -daily BMP -zofran PRN -nutrition recommendation - Boost, Prosource, encourage PO intake -continue to  work with PT/OT -CIR consult in progress  Left Upper Leg Pain Patient with left upper thigh pain that has improved. Pubic rami fractures may be contributing. -will consult ortho in coming days -muscle rub PRN -tylenol 100 mg TID scheduled -PT/OT  Shaking Episodes No new shaking episodes today. Possibly due to infection from extensive wound. Also checking tacrolimus levels in setting of starting fluconazole therapy.  -will continue to monitor. -daily bmp  Pressure ulcer of coccygeal region, stage 2 Secondary to patient's bedrest from her persistent weakness.  -will re-consult wound care.  Esophageal Candidiasis  Continue oral fluconazole -pending tacrolimus levels today  Normocytic Anemia Hx of Iron Deficiency Anemia Hx of Femoral Vein DVT Hgb 8.2>7.9. EGD with no clear etiology for blood loss. Suspect secondary to chronic wound, malignancy, and malnutrition. Patient to be switched back to oral anticoagulation today per pharmacy.  -daily CBC -transfuse if under 7  Nausea/Vomiting Patient without emesis today.  -PRN zofran -Continue to monitor -Renal diet -QTc normal at this time, if nauseated will add anti-emetic   Stage 3C Vulvar Cancer  Patient completed treatment at this time. Follows with gyn[facility]onc at [facility]. Evaluated  by Palliative for her chronic medical conditions. Patient's goal documented to be hopeful for continuing treatment options to prolong her quality of life. Would like to remain full code, however, DNR/DNI was discussed between palliative care and the patient and her family. Due to extent of worsening wound, plan to consult gyn/onc -gyn/onc consult in place -continue goals of care discussions.   Kidney Failure s/p kidney transplant 2001 and 2014 Cr stable at 2.9. Nephro document renal function overall stable and to continue current transplant medications. They will sign off at this time.  Hx of Femoral Vein DVT -continue home apixaban  Diet: Renal IVF:  None,None VTE: None in setting of anemia Code: Full Family: Daughter updated this evening of MRI findings. She states the patient informed her that she was not sure as to why we wanted to transfer her. I informed the daughter that the patient has frequently endorsed she would like to be transferred if possible. Daughter agrees to start care here. Will start with gyn/onc consult.   Dispo: Anticipated discharge pending CIR placement [place] Internal Medicine Resident PGY-1 Pager [phone] Please contact the on call pager after 5 pm and on weekends at [phone].

## 2020-04-28 DIAGNOSIS — S31109S Unspecified open wound of abdominal wall, unspecified quadrant without penetration into peritoneal cavity, sequela: Secondary | ICD-10-CM | POA: Diagnosis not present

## 2020-04-28 DIAGNOSIS — C519 Malignant neoplasm of vulva, unspecified: Secondary | ICD-10-CM | POA: Diagnosis not present

## 2020-04-28 DIAGNOSIS — S32501A Unspecified fracture of right pubis, initial encounter for closed fracture: Secondary | ICD-10-CM

## 2020-04-28 DIAGNOSIS — R531 Weakness: Secondary | ICD-10-CM | POA: Diagnosis not present

## 2020-04-28 DIAGNOSIS — E440 Moderate protein-calorie malnutrition: Secondary | ICD-10-CM

## 2020-04-28 DIAGNOSIS — M86151 Other acute osteomyelitis, right femur: Secondary | ICD-10-CM | POA: Diagnosis not present

## 2020-04-28 DIAGNOSIS — N184 Chronic kidney disease, stage 4 (severe): Secondary | ICD-10-CM | POA: Diagnosis not present

## 2020-04-28 DIAGNOSIS — K651 Peritoneal abscess: Secondary | ICD-10-CM | POA: Diagnosis not present

## 2020-04-28 LAB — CBC
HCT: 23.6 % — ABNORMAL LOW (ref 36.0–46.0)
Hemoglobin: 7.8 g/dL — ABNORMAL LOW (ref 12.0–15.0)
MCH: 29 pg (ref 26.0–34.0)
MCHC: 33.1 g/dL (ref 30.0–36.0)
MCV: 87.7 fL (ref 80.0–100.0)
Platelets: 96 10*3/uL — ABNORMAL LOW (ref 150–400)
RBC: 2.69 MIL/uL — ABNORMAL LOW (ref 3.87–5.11)
RDW: 15.8 % — ABNORMAL HIGH (ref 11.5–15.5)
WBC: 8.5 10*3/uL (ref 4.0–10.5)
nRBC: 0 % (ref 0.0–0.2)

## 2020-04-28 LAB — BASIC METABOLIC PANEL
Anion gap: 9 (ref 5–15)
BUN: 51 mg/dL — ABNORMAL HIGH (ref 6–20)
CO2: 22 mmol/L (ref 22–32)
Calcium: 8.1 mg/dL — ABNORMAL LOW (ref 8.9–10.3)
Chloride: 101 mmol/L (ref 98–111)
Creatinine, Ser: 3.03 mg/dL — ABNORMAL HIGH (ref 0.44–1.00)
GFR, Estimated: 18 mL/min — ABNORMAL LOW (ref >60)
Glucose, Bld: 80 mg/dL (ref 70–99)
Potassium: 4.4 mmol/L (ref 3.5–5.1)
Sodium: 132 mmol/L — ABNORMAL LOW (ref 135–145)

## 2020-04-28 LAB — PATHOLOGIST SMEAR REVIEW

## 2020-04-28 MED ORDER — COLLAGENASE 250 UNIT/GM EX OINT
TOPICAL_OINTMENT | Freq: Every day | CUTANEOUS | Status: DC
  Filled 2020-04-28: qty 30

## 2020-04-28 NOTE — Consult Note (Addendum)
[facility] Nurse Consult Note: Patient receiving care in [place] Reason for Consult: Sacral wounds Wound type: Unstageable/MASD wounds scattered on the sacrum. Pressure Injury POA: Yes Measurement: Largest wound is 1.6 cm x 2 cm x 0.2 cm 3 wounds on the right upper posterior thigh/gluteal fold measures 1.5 cm x 2.2 cm x 0.2 cm with skin islands in between. Inguinal and labial wounds previously assessed by L. McNichol, continue treatment as ordered Wound bed: 90% Yellow 10% pink Drainage (amount, consistency, odor) None Periwound: Pink with dry edges Dressing procedure/placement/frequency: Apply Santyl to sacral wounds in a nickel thick layer. Cover with a saline moistened gauze, then dry gauze, ABD pad or foam dressing.  Change daily.  Monitor the wound area(s) for worsening of condition such as: Signs/symptoms of infection, increase in size, development of or worsening of odor, development of pain, or increased pain at the affected locations.   Notify the medical team if any of these develop.  Thank you for the consult. [place] nurse will not follow at this time.   Please re-consult the [facility] team if needed.  Cathlean Marseilles Tamala Julian, MSN, RN, [place], [place]le [place], [facility] Wound Treatment Associate Pager [phone]

## 2020-04-28 NOTE — Progress Notes (Signed)
[facility] for Infectious Disease  Date of Admission:  04/20/2020      Total days of antibiotics Levaquin + flagyl through 1/22 (3 weeks)           ASSESSMENT: Christina Mack is a 55 y.o. female on immunosuppression for kidney transplant here with large chronic deep ulceration of the right labia and upper inner thigh fold. This has eroded deeply since the end of her radiation therapy.  Became superinfected recently complicated by bacteremia due to bacteroides and eggerthella species.   MRI of pelvis reveals large ulceration of R labial fold with sinus tract and diffuse phlegmon and extensive myositis in bilateral adductor and anterior muscular compartments of both thighs. Multilocular fluid collection extending in to the inferior left pelvic soft tissues surrounding the pubic rami and extending up to anterior thigh c/w abscess. Nondisplaced fractures of the inferior R pubic rami with findings suggestive of osteomyelitis and anterior pubic symphysis. Osteo suggested in the anterior left pubic symphysis.   Pending orthopedic and gyn surgery consults for assistance with treatment. She has abscesses now that have seemed to develop following 3 weeks of antibiotic therapy targeting previous blood cultures from [place] admission in early January. Given irradiated and infected bone I worry she needs some debridement of bone and drainage of loculated abscess.     PLAN: 1. Hold on antibiotics for now 2. FU ortho and gyn recommendations.     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  (feeding supplement) PROSource Plus  30 mL Oral TID BM  . sodium chloride   Intravenous Once  . acetaminophen  1,000 mg Oral TID  . apixaban  2.5 mg Oral BID  . collagenase   Topical Daily  . feeding supplement  1 Container Oral TID BM  . fluconazole  100 mg Oral Daily  . magnesium oxide  400 mg Oral BID  . methadone  2.5 mg Oral Q12H  . ondansetron (ZOFRAN) IV  4 mg Intravenous Once  . pantoprazole  40 mg Oral BID AC  . predniSONE  5 mg Oral Q breakfast  . tacrolimus  0.5 mg Oral BID    SUBJECTIVE: Enjoyed her dinner last night. No nausea since stopping antibiotics. IV zofran did seem to help at least avoid vomiting but attributes most nausea and food aversion to radiation.  Has not seen anyone from surgery services yet.     Review of Systems: Review of Systems  Constitutional: Negative for chills and fever.  HENT: Negative for tinnitus.   Eyes: Negative for blurred vision and photophobia.  Respiratory: Negative for cough and sputum production.   Cardiovascular: Negative for chest pain.  Gastrointestinal: Negative for diarrhea, nausea and vomiting.  Genitourinary: Negative for dysuria.  Musculoskeletal: Positive for back pain and joint pain.  Skin: Negative for rash.  Neurological: Negative for headaches.    Allergies  Allergen Reactions  . Codeine     vomiting  . Hydromorphone Nausea And Vomiting  . Oxycodone-Acetaminophen Itching, Nausea Only, Other (See Comments)  and Nausea And Vomiting    Other Reaction: HA    OBJECTIVE: Vitals:   04/27/20 1222 04/27/20 1700 04/27/20 2350 04/28/20 0545  BP: 132/90 (!) 135/95 (!) 135/94 (!) 134/96  Pulse: 98 93 94 (!) 103  Resp: 16 16 17 17   Temp: 98.3 F (36.8 C) 98.5 F (36.9 C) 98.5 F (36.9 C) 98.7 F (37.1 C)  TempSrc: Oral Oral Oral Oral  SpO2: 99% 99% 99% 99%  Weight:    50.4 kg  Height:       Body mass index is 18.49 kg/m.   Physical Exam Constitutional:      Appearance: Normal appearance. She is  ill-appearing.     Comments: Thin appearing, resting comfortably in bed.   HENT:     Mouth/Throat:     Mouth: Mucous membranes are moist.     Pharynx: Oropharynx is clear.  Eyes:     General: No scleral icterus. Pulmonary:     Effort: Pulmonary effort is normal.  Neurological:     Mental Status: She is alert and oriented to person, place, and time.  Psychiatric:        Mood and Affect: Mood normal.        Thought Content: Thought content normal.     Lab Results Lab Results  Component Value Date   WBC 8.5 04/28/2020   HGB 7.8 (L) 04/28/2020   HCT 23.6 (L) 04/28/2020   MCV 87.7 04/28/2020   PLT 96 (L) 04/28/2020    Lab Results  Component Value Date   CREATININE 3.03 (H) 04/28/2020   BUN 51 (H) 04/28/2020   NA 132 (L) 04/28/2020   K 4.4 04/28/2020   CL 101 04/28/2020   CO2 22 04/28/2020    Lab Results  Component Value Date   ALT 8 04/24/2020   AST 14 (L) 04/24/2020   ALKPHOS 102 04/24/2020   BILITOT 0.9 04/24/2020     Microbiology: BCx 1/25 >> no growth, prelim   [name], MSN, NP-C [facility] for Infectious Disease [email or web address] Pager: [phone] Office: [phone] [place]: [phone]

## 2020-04-28 NOTE — Consult Note (Signed)
Reason for Consult: Perieal/vulva lesion Referring Physician: Sanjuana Letters, MD  Christina Mack is an 55 y.o. female.  HPI: The patient's history is remarkable for a stage IVA vulva cancer.  She was admitted from the ED approximately 1 week ago with weakness, NV.   She was diagnosed w/VIN 2 -3 in 2010.  She underwent a WLE and laser ablation at [facility] in 210.  Symptoms recurred in 8/20.  There was a delay in care 2/2 contracting Covid.  In 6/21 she again was diagnosed with at least VIN 3. The lesion was described as large and necrotic.  A f/u PET/CT showed a large area of infiltrative tissue alon th the right side of the perineum.  There were 2 PET avid right-sided groin nodes.  The tumor was felt to be unresectable.  She was treated with RT in 8/21; this was completed in 10/21.  Chemo rx at the time was discontinued due to elevated Cr; she is s/p kidney transplants x 2.  She was seen by RAD-ONC at [facility] in f/u in 12/21.  There was felt to be slow tumor regression and she persistence of a necrotic opening that was decreasing in size.  The plan was to refer her to a wound center which declined in size.     She presented to [facility] ED with weakness, N/V earlier this month.  She also complained of perinea/wound pain.  She was not felt to have necrotizing fasciitis.  ID was consulted and she was treated w/a course of antibiotic; the 3 week course of broad spectrum antibiotics was to be completed on 1/22.    Past Medical History:  Diagnosis Date  . Complication of anesthesia    nausea and vomiting    Past Surgical History:  Procedure Laterality Date  . BIOPSY  04/24/2020   Procedure: BIOPSY;  Surgeon: Doran Stabler, MD;  Location: [place];  Service: Gastroenterology;;  . ESOPHAGOGASTRODUODENOSCOPY (EGD) WITH PROPOFOL N/A 04/24/2020   Procedure: ESOPHAGOGASTRODUODENOSCOPY (EGD) WITH PROPOFOL;  Surgeon: Doran Stabler, MD;  Location: [facility];  Service: Gastroenterology;  Laterality:  N/A;    History reviewed. No pertinent family history.  Social History:  reports that she has never smoked. She has never used smokeless tobacco. No history on file for alcohol use and drug use.  Allergies:  Allergies  Allergen Reactions  . Codeine     vomiting  . Hydromorphone Nausea And Vomiting  . Oxycodone-Acetaminophen Itching, Nausea Only, Other (See Comments) and Nausea And Vomiting    Other Reaction: HA    Medications: I have reviewed the patient's current medications.  Results for orders placed or performed during the hospital encounter of 04/20/20 (from the past 48 hour(s))  Basic metabolic panel     Status: Abnormal   Collection Time: 04/27/20  2:57 AM  Result Value Ref Range   Sodium 130 (L) 135 - 145 mmol/L   Potassium 3.9 3.5 - 5.1 mmol/L   Chloride 99 98 - 111 mmol/L   CO2 21 (L) 22 - 32 mmol/L   Glucose, Bld 78 70 - 99 mg/dL    Comment: Glucose reference range applies only to samples taken after fasting for at least 8 hours.   BUN 46 (H) 6 - 20 mg/dL   Creatinine, Ser 2.99 (H) 0.44 - 1.00 mg/dL   Calcium 8.2 (L) 8.9 - 10.3 mg/dL   GFR, Estimated 18 (L) >60 mL/min    Comment: (NOTE) Calculated using the CKD-EPI Creatinine Equation (2021)  Anion gap 10 5 - 15    Comment: Performed at [facility] [address] [place], [place] [address]  Sedimentation rate     Status: Abnormal   Collection Time: 04/27/20  2:57 AM  Result Value Ref Range   Sed Rate 73 (H) 0 - 22 mm/hr    Comment: Performed at [facility] [address] [place], [place] [address]  C-reactive protein     Status: Abnormal   Collection Time: 04/27/20  2:57 AM  Result Value Ref Range   CRP 5.4 (H) <1.0 mg/dL    Comment: Performed at [facility] [address] [place], [place] [address]  CBC     Status: Abnormal   Collection Time: 04/27/20  3:17 AM  Result Value Ref Range   WBC 8.8 4.0 - 10.5 K/uL   RBC 2.86 (L) 3.87 - 5.11 MIL/uL   Hemoglobin 7.9 (L) 12.0 -  15.0 g/dL   HCT 25.4 (L) 36.0 - 46.0 %   MCV 88.8 80.0 - 100.0 fL   MCH 27.6 26.0 - 34.0 pg   MCHC 31.1 30.0 - 36.0 g/dL   RDW 15.6 (H) 11.5 - 15.5 %   Platelets 87 (L) 150 - 400 K/uL    Comment: Immature Platelet Fraction may be clinically indicated, consider ordering this additional test [id number] CONSISTENT WITH PREVIOUS RESULT    nRBC 0.0 0.0 - 0.2 %    Comment: Performed at [facility], [place] [address], [place], [place] [address]  Save Smear     Status: None   Collection Time: 04/27/20  6:25 AM  Result Value Ref Range   Smear Review SMEAR STAINED AND AVAILABLE FOR REVIEW     Comment: Performed at [facility] [address], [place], [place] [address]  Culture, blood (routine x 2)     Status: None (Preliminary result)   Collection Time: 04/27/20  7:30 AM   Specimen: BLOOD RIGHT HAND  Result Value Ref Range   Specimen Description BLOOD RIGHT HAND    Special Requests      BOTTLES DRAWN AEROBIC AND ANAEROBIC Blood Culture adequate volume   Culture      NO GROWTH 1 DAY Performed at [facility], [place] [address] [place], [place] [address]    Report Status PENDING   Culture, blood (routine x 2)     Status: None (Preliminary result)   Collection Time: 04/27/20  7:31 AM   Specimen: BLOOD LEFT HAND  Result Value Ref Range   Specimen Description BLOOD LEFT HAND    Special Requests      BOTTLES DRAWN AEROBIC AND ANAEROBIC Blood Culture adequate volume   Culture      NO GROWTH 1 DAY Performed at [facility], [place] [address] [place], [place] [address]    Report Status PENDING   CBC     Status: Abnormal   Collection Time: 04/28/20  2:03 AM  Result Value Ref Range   WBC 8.5 4.0 - 10.5 K/uL   RBC 2.69 (L) 3.87 - 5.11 MIL/uL   Hemoglobin 7.8 (L) 12.0 - 15.0 g/dL   HCT 23.6 (L) 36.0 - 46.0 %   MCV 87.7 80.0 - 100.0 fL   MCH 29.0 26.0 - 34.0 pg   MCHC 33.1 30.0 - 36.0 g/dL   RDW 15.8 (H) 11.5 - 15.5 %   Platelets 96 (L) 150 - 400 K/uL    Comment:  REPEATED  TO VERIFY Immature Platelet Fraction may be clinically indicated, consider ordering this additional test [id number]    nRBC 0.0 0.0 - 0.2 %    Comment: Performed at [facility], [place] [address], [place], [place] [address]  Basic metabolic panel     Status: Abnormal   Collection Time: 04/28/20  2:03 AM  Result Value Ref Range   Sodium 132 (L) 135 - 145 mmol/L   Potassium 4.4 3.5 - 5.1 mmol/L   Chloride 101 98 - 111 mmol/L   CO2 22 22 - 32 mmol/L   Glucose, Bld 80 70 - 99 mg/dL    Comment: Glucose reference range applies only to samples taken after fasting for at least 8 hours.   BUN 51 (H) 6 - 20 mg/dL   Creatinine, Ser 3.03 (H) 0.44 - 1.00 mg/dL   Calcium 8.1 (L) 8.9 - 10.3 mg/dL   GFR, Estimated 18 (L) >60 mL/min    Comment: (NOTE) Calculated using the CKD-EPI Creatinine Equation (2021)    Anion gap 9 5 - 15    Comment: Performed at [facility] [address], [place], [place] [address]    MR PELVIS WO CONTRAST  Result Date: 04/27/2020 CLINICAL DATA:  Question of osteomyelitis of the pelvis, right labial wound EXAM: MRI PELVIS WITHOUT CONTRAST TECHNIQUE: Multiplanar multisequence MR imaging of the pelvis was performed. No intravenous contrast was administered. COMPARISON:  None. FINDINGS: Urinary Tract: The visualized distal ureters and bladder appear unremarkable. Bilateral low lying or pelvic kidneys are seen. Mild bilateral pelvicaliectasis is noted. Bowel: No bowel wall thickening, distention or surrounding inflammation identified within the pelvis. Vascular/Lymphatic: No enlarged pelvic lymph nodes identified. No significant vascular findings. Reproductive: The patient is status post hysterectomy. Other: Along the right labial fold there is a large area of ulceration which extends through the area of the inferior pubic rami and extends to the adductor musculature area. There is surrounding non loculated fluid. No loculated fluid collection is seen.  Musculoskeletal: A nondisplaced fracture seen through the inferior right pubic rami with diffusely increased heterogeneous T2 hyperintense signal with T1 hypointensity. Small amounts the anterior pubic symphysis. Increased T2 hyperintense signal seen at the anterior left pubic symphysis with mottled T1 hypointensity. Diffusely increased signal seen within the bilateral adductor musculature. There is a heterogeneous multilocular fluid collection extending along the anterior left inferior pubic rami and into the anterior upper thigh measuring approximately 7 cm in craniocaudad dimension. There is overlying subcutaneous edema and skin thickening is noted left greater right. IMPRESSION: 1. Large area of ulceration with sinus tract seen within the right labial fold with diffuse surrounding phlegmon and extensive myositis in the bilateral adductor and anterior muscular compartments of both thighs. 2. There is a multilocular fluid collection extending in the inferior left pelvic soft tissues surrounding the pubic rami and extending to the anterior upper left thigh, consistent with a multilocular abscess. 3. Nondisplaced fracture of the inferior right pubic rami with findings suggestive osteomyelitis involving the pubic rami and anterior pubic symphysis. 4. Findings suggestive of osteomyelitis involving the anterior left pubic symphysis. Electronically Signed   By: Prudencio Pair M.D.   On: 04/27/2020 09:11    Review of Systems  Genitourinary: Positive for vaginal pain (mild).   Blood pressure (!) 134/96, pulse (!) 103, temperature 98.7 F (37.1 C), temperature source Oral, resp. rate 17, height 5\' 5"  (1.651 m), weight 50.4 kg, SpO2 99 %. Physical Exam Constitutional:      General: She  is not in acute distress.    Comments: Thin  Genitourinary:    Comments: Edema of the mons pubis, left labia majora.  Large necrotic area in the right thigh crease/intertriginous area.  Fibrinous  exudate.       1/18 Assessment/Plan:  Advanced stage vulva cancer.  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  Multiple abscesses, delayed wound healing in the setting of immunosuppressive therapy  >consider debridement, consult w/ID re: antibiotic management >continue wound care; may be a candidate for hyperbaric oxygen >limited utility of IR guided biopsy >continue f/u w/RAD-ONC   Lahoma Crocker, MD 04/28/2020, 12:36 PM

## 2020-04-28 NOTE — Progress Notes (Signed)
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 Of note pt with similar symptoms and presented to [facility] ED where she also developed AKI and a large deep wound of her right labia and inner thigh. Imaging was performed which revealed large right perineal mass with new extension into surrounding soft tissue with stranding and gas. No abscess collection noted. She was evaluated by gen surg and gyn/oncology who recommended IV abx alone. Pt now admitted for further work up. CT head with no acute findings.   OT comments  Patient met in side-lying after bed level toileting task. Patient progressed from side-lying to sitting EOB with Min A and increased/time effort with HOB elevated. Patient with anxiety/hesitation about standing or getting to [facility] noting fear of convulsions and falling. +2 assist provided to increase patient participation with therapy efforts and decrease anxiety. Patient completed standing hygiene/clothing management with Min A for steadying only and stand-pivot transfer to recliner with Min A +2 and use of RW. Patient in tears throughout session. Max A to doff/don footwear 2/2 pain in L groin/hip. Patient would benefit from continued acute OT services to maximize safety and independence with self-care tasks. Continued recommendation for intensive CIR.    Follow Up Recommendations  CIR    Equipment Recommendations  Wheelchair cushion (measurements OT);Wheelchair (measurements OT);3 in 1 bedside commode;Other (comment)    Recommendations for Other Services Rehab consult    Precautions / Restrictions Precautions Precautions:  Fall Precaution Comments: recent falls Restrictions Weight Bearing Restrictions: No       Mobility Bed Mobility Overal bed mobility: Needs Assistance Bed Mobility: Supine to Sit     Supine to sit: Min assist     General bed mobility comments: Able to advance BLE toward EOB with HOB significantly elevated. Increased time/effort to bring trunk upright.  Transfers Overall transfer level: Needs assistance Equipment used: Rolling walker (2 wheeled) Transfers: Sit to/from [facility] Sit to Stand: Min assist Stand pivot transfers: Min assist;+2 physical assistance       General transfer comment: Progressed from EOB to recliner with Min A +2. Patient not reliant on +2 external assist but helpful for patient peace of mind.    Balance Overall balance assessment: Needs assistance Sitting-balance support: Feet supported Sitting balance-Leahy Scale: Fair Sitting balance - Comments: Able to maintain static sitting balance at EOB with perference to unilateral UE support on bed surface.   Standing balance support: Bilateral upper extremity supported Standing balance-Leahy Scale: Poor Standing balance comment: Reliant on UE support or RW.                           ADL either performed or assessed with clinical judgement   ADL                   Upper Body Dressing : Minimal assistance;Sitting   Lower Body Dressing: Maximal assistance;Sitting/lateral leans Lower Body Dressing Details (indicate cue type and reason): Assist to doff/don socks. Unable to attain/maintain figure-4 position 2/2 pain. Toilet Transfer: Minimal assistance;+2 for physical assistance Toilet Transfer Details (indicate cue type and reason): Simulated with stand-pivot transfer to  recliner with Min A +2. Patient not heavily reliant on external assist but +2 for patient peace of mind.         Functional mobility during ADLs: Minimal assistance;+2 for physical assistance General  ADL Comments: Min A +2 and use of RW for transfer to recliner. Patient with increased anxiety with thought of ambulation.     Vision       Perception     Praxis      Cognition Arousal/Alertness: Awake/alert Behavior During Therapy: Flat affect;WFL for tasks assessed/performed Overall Cognitive Status: Within Functional Limits for tasks assessed                                 General Comments: Patient tearful throughout. Anxiety related to standing/ambulating 2/2 fear of convulsions.        Exercises     Shoulder Instructions       General Comments Patient afraid of convulsions with standing/ambulating. Fear of falling.    Pertinent Vitals/ Pain       Pain Assessment: Faces Faces Pain Scale: Hurts little more Pain Location: L hip/groin with LB dressing. Pain Descriptors / Indicators: Grimacing;Crying Pain Intervention(s): Monitored during session;Relaxation  Home Living                                          Prior Functioning/Environment              Frequency  Min 2X/week        Progress Toward Goals  OT Goals(current goals can now be found in the care plan section)  Progress towards OT goals: Progressing toward goals  Acute Rehab OT Goals Patient Stated Goal: get home OT Goal Formulation: With patient Time For Goal Achievement: 05/05/20 Potential to Achieve Goals: Good ADL Goals Pt Will Perform Grooming: with supervision;sitting Pt Will Perform Lower Body Bathing: with min assist;sitting/lateral leans;sit to/from stand Pt Will Perform Upper Body Dressing: with supervision;sitting Pt Will Perform Lower Body Dressing: with min assist;sit to/from stand;sitting/lateral leans Pt Will Transfer to Toilet: with min guard assist;ambulating Pt Will Perform Toileting - Clothing Manipulation and hygiene: with min guard assist;sit to/from stand;sitting/lateral leans  Plan Discharge plan remains appropriate;Frequency remains  appropriate    Co-evaluation                 AM-PAC OT "6 Clicks" Daily Activity     Outcome Measure   Help from another person eating meals?: None Help from another person taking care of personal grooming?: A Little Help from another person toileting, which includes using toliet, bedpan, or urinal?: A Lot Help from another person bathing (including washing, rinsing, drying)?: A Lot Help from another person to put on and taking off regular upper body clothing?: A Little Help from another person to put on and taking off regular lower body clothing?: A Lot 6 Click Score: 16    End of Session Equipment Utilized During Treatment: Gait belt;Rolling walker  OT Visit Diagnosis: Other abnormalities of gait and mobility (R26.89);Unsteadiness on feet (R26.81);Muscle weakness (generalized) (M62.81)   Activity Tolerance Patient tolerated treatment well   Patient Left in chair;with call bell/phone within reach;with chair alarm set   Nurse Communication          Time: 3235-5732 OT Time Calculation (min): 16 min  Charges: OT General Charges $OT Visit:  1 Visit OT Treatments $Self Care/Home Management : 8-22 mins  Aviela Blundell H. OTR/L Supplemental OT, Department of rehab services [phone]   Mekai Wilkinson R H. 04/28/2020, 3:09 PM

## 2020-04-28 NOTE — Progress Notes (Signed)
Inpatient Rehab Admissions Coordinator:     I received a request for additional clinicals from Pt.'s insurance and I am sending them today. Also await gyno/surgery consult. I do not have a CIR bed for Pt. Today. I notified Pt. And provided an update.   Clemens Catholic, [place], [place] Admissions Coordinator  [phone] ([place]) [phone] (office)

## 2020-04-28 NOTE — Consult Note (Signed)
Reason for Consult:Pubic osteo Referring Physician: Angelia Mould Time called: 8144 Time at bedside: [place] is an 55 y.o. female.  HPI: Christina Mack was admitted a week ago with some unexplained rigors. She had been feeling bad and progressively weak. She had requested to go to [facility] but the rigors necessitated delivery to the closest institution and she was brought here. 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 She developed a wound in her right inguinal/perineal area. MRI here demonstrated multiple pelvic fluid collections as well as osteo of both superior pubic rami to include the symphysis and orthopedic surgery was consulted. She currently has no c/o.  Past Medical History:  Diagnosis Date  . Complication of anesthesia    nausea and vomiting    Past Surgical History:  Procedure Laterality Date  . BIOPSY  04/24/2020   Procedure: BIOPSY;  Surgeon: Doran Stabler, MD;  Location: [place];  Service: Gastroenterology;;  . ESOPHAGOGASTRODUODENOSCOPY (EGD) WITH PROPOFOL N/A 04/24/2020   Procedure: ESOPHAGOGASTRODUODENOSCOPY (EGD) WITH PROPOFOL;  Surgeon: Doran Stabler, MD;  Location: [place];  Service: Gastroenterology;  Laterality: N/A;    History reviewed. No pertinent family history.  Social History:  reports that she has never smoked. She has never used smokeless tobacco. No history on file for alcohol use and drug use.  Allergies:  Allergies  Allergen Reactions  . Codeine     vomiting  . Hydromorphone Nausea And Vomiting  . Oxycodone-Acetaminophen Itching, Nausea Only, Other (See Comments) and Nausea And Vomiting    Other Reaction: HA    Medications: I have reviewed the patient's current medications.  Results for orders placed or performed during the hospital encounter of 04/20/20 (from the past 48 hour(s))  Basic metabolic panel     Status: Abnormal   Collection Time: 04/27/20  2:57 AM  Result  Value Ref Range   Sodium 130 (L) 135 - 145 mmol/L   Potassium 3.9 3.5 - 5.1 mmol/L   Chloride 99 98 - 111 mmol/L   CO2 21 (L) 22 - 32 mmol/L   Glucose, Bld 78 70 - 99 mg/dL    Comment: Glucose reference range applies only to samples taken after fasting for at least 8 hours.   BUN 46 (H) 6 - 20 mg/dL   Creatinine, Ser 2.99 (H) 0.44 - 1.00 mg/dL   Calcium 8.2 (L) 8.9 - 10.3 mg/dL   GFR, Estimated 18 (L) >60 mL/min    Comment: (NOTE) Calculated using the CKD-EPI Creatinine Equation (2021)    Anion gap 10 5 - 15    Comment: Performed at [facility] [address], [place], [place] [address]  Sedimentation rate     Status: Abnormal   Collection Time: 04/27/20  2:57 AM  Result Value Ref Range   Sed Rate 73 (H) 0 - 22 mm/hr    Comment: Performed at [facility] [address], [place], [place] [address]  C-reactive protein     Status: Abnormal   Collection Time: 04/27/20  2:57 AM  Result Value Ref Range   CRP 5.4 (H) <1.0 mg/dL    Comment: Performed at [facility] [address] [place] [address]  CBC     Status: Abnormal   Collection Time: 04/27/20  3:17 AM  Result Value Ref Range   WBC 8.8 4.0 - 10.5 K/uL   RBC 2.86 (L) 3.87 -  5.11 MIL/uL   Hemoglobin 7.9 (L) 12.0 - 15.0 g/dL   HCT 25.4 (L) 36.0 - 46.0 %   MCV 88.8 80.0 - 100.0 fL   MCH 27.6 26.0 - 34.0 pg   MCHC 31.1 30.0 - 36.0 g/dL   RDW 15.6 (H) 11.5 - 15.5 %   Platelets 87 (L) 150 - 400 K/uL    Comment: Immature Platelet Fraction may be clinically indicated, consider ordering this additional test [id number] CONSISTENT WITH PREVIOUS RESULT    nRBC 0.0 0.0 - 0.2 %    Comment: Performed at [facility], [place] [address], [place], [place] [address]  Save Smear     Status: None   Collection Time: 04/27/20  6:25 AM  Result Value Ref Range   Smear Review SMEAR STAINED AND AVAILABLE FOR REVIEW     Comment: Performed at [facility] [address] [place], [place] [address]   Culture, blood (routine x 2)     Status: None (Preliminary result)   Collection Time: 04/27/20  7:30 AM   Specimen: BLOOD RIGHT HAND  Result Value Ref Range   Specimen Description BLOOD RIGHT HAND    Special Requests      BOTTLES DRAWN AEROBIC AND ANAEROBIC Blood Culture adequate volume   Culture      NO GROWTH 1 DAY Performed at [facility], [place] [address], [place], [place] [address]    Report Status PENDING   Culture, blood (routine x 2)     Status: None (Preliminary result)   Collection Time: 04/27/20  7:31 AM   Specimen: BLOOD LEFT HAND  Result Value Ref Range   Specimen Description BLOOD LEFT HAND    Special Requests      BOTTLES DRAWN AEROBIC AND ANAEROBIC Blood Culture adequate volume   Culture      NO GROWTH 1 DAY Performed at [facility], [place] [address], [place], [place] [address]    Report Status PENDING   CBC     Status: Abnormal   Collection Time: 04/28/20  2:03 AM  Result Value Ref Range   WBC 8.5 4.0 - 10.5 K/uL   RBC 2.69 (L) 3.87 - 5.11 MIL/uL   Hemoglobin 7.8 (L) 12.0 - 15.0 g/dL   HCT 23.6 (L) 36.0 - 46.0 %   MCV 87.7 80.0 - 100.0 fL   MCH 29.0 26.0 - 34.0 pg   MCHC 33.1 30.0 - 36.0 g/dL   RDW 15.8 (H) 11.5 - 15.5 %   Platelets 96 (L) 150 - 400 K/uL    Comment: REPEATED TO VERIFY Immature Platelet Fraction may be clinically indicated, consider ordering this additional test [id number]    nRBC 0.0 0.0 - 0.2 %    Comment: Performed at [facility], [place] [address] [place], [place] [address]  Basic metabolic panel     Status: Abnormal   Collection Time: 04/28/20  2:03 AM  Result Value Ref Range   Sodium 132 (L) 135 - 145 mmol/L   Potassium 4.4 3.5 - 5.1 mmol/L   Chloride 101 98 - 111 mmol/L   CO2 22 22 - 32 mmol/L   Glucose, Bld 80 70 - 99 mg/dL    Comment: Glucose reference range applies only to samples taken after fasting for at least 8 hours.   BUN 51 (H) 6 - 20 mg/dL   Creatinine, Ser 3.03 (H) 0.44 - 1.00 mg/dL   Calcium 8.1  (L) 8.9 - 10.3 mg/dL  GFR, Estimated 18 (L) >60 mL/min    Comment: (NOTE) Calculated using the CKD-EPI Creatinine Equation (2021)    Anion gap 9 5 - 15    Comment: Performed at [facility], [place] [address] [place], [place] [address]    MR PELVIS WO CONTRAST  Result Date: 04/27/2020 CLINICAL DATA:  Question of osteomyelitis of the pelvis, right labial wound EXAM: MRI PELVIS WITHOUT CONTRAST TECHNIQUE: Multiplanar multisequence MR imaging of the pelvis was performed. No intravenous contrast was administered. COMPARISON:  None. FINDINGS: Urinary Tract: The visualized distal ureters and bladder appear unremarkable. Bilateral low lying or pelvic kidneys are seen. Mild bilateral pelvicaliectasis is noted. Bowel: No bowel wall thickening, distention or surrounding inflammation identified within the pelvis. Vascular/Lymphatic: No enlarged pelvic lymph nodes identified. No significant vascular findings. Reproductive: The patient is status post hysterectomy. Other: Along the right labial fold there is a large area of ulceration which extends through the area of the inferior pubic rami and extends to the adductor musculature area. There is surrounding non loculated fluid. No loculated fluid collection is seen. Musculoskeletal: A nondisplaced fracture seen through the inferior right pubic rami with diffusely increased heterogeneous T2 hyperintense signal with T1 hypointensity. Small amounts the anterior pubic symphysis. Increased T2 hyperintense signal seen at the anterior left pubic symphysis with mottled T1 hypointensity. Diffusely increased signal seen within the bilateral adductor musculature. There is a heterogeneous multilocular fluid collection extending along the anterior left inferior pubic rami and into the anterior upper thigh measuring approximately 7 cm in craniocaudad dimension. There is overlying subcutaneous edema and skin thickening is noted left greater right. IMPRESSION: 1. Large area of  ulceration with sinus tract seen within the right labial fold with diffuse surrounding phlegmon and extensive myositis in the bilateral adductor and anterior muscular compartments of both thighs. 2. There is a multilocular fluid collection extending in the inferior left pelvic soft tissues surrounding the pubic rami and extending to the anterior upper left thigh, consistent with a multilocular abscess. 3. Nondisplaced fracture of the inferior right pubic rami with findings suggestive osteomyelitis involving the pubic rami and anterior pubic symphysis. 4. Findings suggestive of osteomyelitis involving the anterior left pubic symphysis. Electronically Signed   By: Prudencio Pair M.D.   On: 04/27/2020 09:11    Review of Systems  Constitutional: Negative for chills, diaphoresis and fever.  HENT: Negative for ear discharge, ear pain, hearing loss and tinnitus.   Eyes: Negative for photophobia and pain.  Respiratory: Negative for cough and shortness of breath.   Cardiovascular: Negative for chest pain.  Gastrointestinal: Negative for abdominal pain, nausea and vomiting.  Genitourinary: Negative for dysuria, flank pain, frequency and urgency.  Musculoskeletal: Negative for back pain, myalgias and neck pain.  Skin: Positive for wound.  Neurological: Positive for tremors (Not currently). Negative for dizziness and headaches.  Hematological: Does not bruise/bleed easily.  Psychiatric/Behavioral: The patient is not nervous/anxious.    Blood pressure (!) 133/93, pulse 97, temperature 98.4 F (36.9 C), temperature source Oral, resp. rate 18, height 5\' 5"  (1.651 m), weight 50.4 kg, SpO2 98 %. Physical Exam Constitutional:      General: She is not in acute distress.    Appearance: She is well-developed and well-nourished. She is not diaphoretic.  HENT:     Head: Normocephalic and atraumatic.  Eyes:     General: No scleral icterus.       Right eye: No discharge.        Left eye: No discharge.  Conjunctiva/sclera: Conjunctivae normal.  Cardiovascular:     Rate and Rhythm: Normal rate and regular rhythm.  Pulmonary:     Effort: Pulmonary effort is normal. No respiratory distress.  Musculoskeletal:     Cervical back: Normal range of motion.  Skin:    General: Skin is warm and dry.  Neurological:     Mental Status: She is alert.  Psychiatric:        Mood and Affect: Mood and affect normal.        Behavior: Behavior normal.     Assessment/Plan: Pelvic osteo -- I feel the role of orthopedics is limited here. Would strongly suggest consulting inpatient plastic surgery (Drs. Marla Roe and Claudia Desanctis). Feel patient likely needs I&D for the fluid collections but don't feel orthopedics is the right service for that work. If bone biopsies are desired we can coordinate with operative team to obtain those at the time of I&D. Surgical resection would be highly morbid and would not recommend that at this time. Lastly, as pt is a transplant patient would reach out to [facility] (if this has not already been done) as they often like to care for their patient in-house (even for unrelated diagnoses) but given current state of the world I suspect they will decline.     Christina Abu, PA-C Orthopedic Surgery [phone] 04/28/2020, 2:22 PM

## 2020-04-28 NOTE — Progress Notes (Deleted)
OT Cancellation Note  Patient Details Name: Christina Mack MRN: [id number] DOB: [birth year]   Cancelled Treatment:    Reason Eval/Treat Not Completed: Patient at procedure or test/ unavailable;Other (comment) patient off floor for HD. OT to check back as time allows.   Gloris Manchester OTR/L Supplemental OT, Department of rehab services [phone]  Char Feltman R H. 04/28/2020, 1:48 PM

## 2020-04-28 NOTE — Progress Notes (Signed)
HD#8 Subjective:   No acute events overnight.   Patient notes that she is feeling "fine." Asks for today's creatinine level. States she has not tried getting up because she's still concerned about having additional shaking. Had a BM this morning. Denies fevers or chills.  Reports tolerating her breakfast well, had egg sandwich with sausage patty.   Objective:  Vital signs in last 24 hours: Vitals:   04/27/20 1222 04/27/20 1700 04/27/20 2350 04/28/20 0545  BP: 132/90 (!) 135/95 (!) 135/94 (!) 134/96  Pulse: 98 93 94 (!) 103  Resp: 16 16 17 17   Temp: 98.3 F (36.8 C) 98.5 F (36.9 C) 98.5 F (36.9 C) 98.7 F (37.1 C)  TempSrc: Oral Oral Oral Oral  SpO2: 99% 99% 99% 99%  Weight:      Height:       Supplemental O2: Room Air SpO2: 99 %  Constitutional: ill appearing, thin, sitting in bed, in no acute distress Cardiovascular: regular rate and rhythm Pulmonary/Chest: normal work of breathing on room air Abdominal:  non-distended Neurological: alert & oriented x 3 Skin: Dry, poor skin turgor. Echymosis, Purpura on extremities.  Psych: alert and oriented  Filed Weights   04/25/20 0417 04/26/20 0444 04/27/20 0451  Weight: 50.8 kg 50.6 kg 51.1 kg     Intake/Output Summary (Last 24 hours) at 04/28/2020 0643 Last data filed at 04/27/2020 1800 Gross per 24 hour  Intake 840 ml  Output 1 ml  Net 839 ml   Net IO Since Admission: 3,174.72 mL [04/28/20 0643]  Pertinent Labs: CBC Latest Ref Rng & Units 04/28/2020 04/27/2020 04/26/2020  WBC 4.0 - 10.5 K/uL 8.5 8.8 9.3  Hemoglobin 12.0 - 15.0 g/dL 7.8(L) 7.9(L) 8.2(L)  Hematocrit 36.0 - 46.0 % 23.6(L) 25.4(L) 26.6(L)  Platelets 150 - 400 K/uL 96(L) 87(L) 85(L)    CMP Latest Ref Rng & Units 04/28/2020 04/27/2020 04/26/2020  Glucose 70 - 99 mg/dL 80 78 122(H)  BUN 6 - 20 mg/dL 51(H) 46(H) 42(H)  Creatinine 0.44 - 1.00 mg/dL 3.03(H) 2.99(H) 2.86(H)  Sodium 135 - 145 mmol/L 132(L) 130(L) 131(L)  Potassium 3.5 - 5.1 mmol/L 4.4 3.9  4.4  Chloride 98 - 111 mmol/L 101 99 99  CO2 22 - 32 mmol/L 22 21(L) 23  Calcium 8.9 - 10.3 mg/dL 8.1(L) 8.2(L) 8.1(L)  Total Protein 6.5 - 8.1 g/dL - - -  Total Bilirubin 0.3 - 1.2 mg/dL - - -  Alkaline Phos 38 - 126 U/L - - -  AST 15 - 41 U/L - - -  ALT 0 - 44 U/L - - -    Imaging: MRI Pelvis W/O Contrast 1. Large area of ulceration with sinus tract seen within the right labial fold with diffuse surrounding phlegmon and extensive myositis in the bilateral adductor and anterior muscular compartments of both thighs. 2. There is a multilocular fluid collection extending in the inferior left pelvic soft tissues surrounding the pubic rami and extending to the anterior upper left thigh, consistent with a multilocular abscess. 3. Nondisplaced fracture of the inferior right pubic rami with findings suggestive osteomyelitis involving the pubic rami and anterior pubic symphysis. 4. Findings suggestive of osteomyelitis involving the anterior left pubic symphysis.  Assessment/Plan:   Active Problems:   Weakness   Pressure ulcer of coccygeal region, stage 2 (HCC)   Thrombocytopenia (HCC)   Kidney transplant status   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([facility])   Esophageal candidiasis (HCC)   Normocytic anemia   CKD (chronic kidney disease) stage 4,  GFR 15-29 ml/min (HCC)   Wound of right groin   Gastric ulcer without hemorrhage or perforation   Malnutrition of moderate degree   Acute osteomyelitis of right pelvic region and thigh [facility])  Patient Summary: Christina Mack is a 55 y/o F with a PMHx of Stage IIIC Vulvar Cancer s/p chemoradiation, CKD s/p renal transplant (2001, 2014), HTN, HLD, 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   Right Labial Soft Tissue Ulceration w/ Phlegmon/Myositis Suspected Multilocular Abscess Soft Tissue of Pelvis Nondisplaced fracture of Inferior Right Pubic Rami Suspected  Osteomyelitis of Pubic rami, Anterior Pubic Symphysis Patient denies systemic symptoms. WBC stable. Discussed case with Dr. Delsa Sale of Gyn/Onc, appreciate her time and recommendations in this patient's complicated case. Ortho to evaluate osteomyelitis/fracture and possible need for biopsy to further direct abx therapy. Patient requesting to do her own dressing changes, however, will have discussion with her tomorrow about need to have someone who can fully evaluate the wound daily will be important. -Gyn/Onc, orthopedics, ID, Palliative consulted, pending recommendations. -hold abx for now, stable and awaiting potential cultures by specialists -BC negative 24 hrs -discussed plan with patient/family  -follow wound care recommendations -completed course of levofloxacin and metronidazole on 01/22  Stage 3C Vulvar Cancer Patient completed treatment at this time and scheduled for outpatient PET scan. Patient's daughter notes patient has been seen once by [facility] and has not seen by [facility] in over 15 yrs. Our gyn/onc docs have been consulted. Seen by palliative again today, will need multidisciplinary approach and all specialists and providers on same page moving forward to stay consistent with patient.  -gyn/onc consult in place, awaiting recommendations -continue goals of care discussions.   Generalized Weakness Moderate Malnutrition 2/2 Malignancy/Chronic Illness Multifactorial etiology; deconditioning, malnutrition, recent 2 week hospitalization, severe right labial wound and hx of cancer. Moderate malnutrition.  -daily BMP -zofran PRN -nutrition recommendation - Boost, Prosource, encourage PO intake -continue to work with PT/OT -CIR consult in progress  Left Upper Leg Pain Patient with left upper thigh pain that has improved. Pubic rami fractures may be contributing. -will consult ortho in coming days -muscle rub PRN -tylenol 100 mg TID scheduled -PT/OT  Shaking  Episodes No new shaking episodes today. Possibly due to infection from extensive wound. Also checking tacrolimus levels in setting of starting fluconazole therapy. Patient fearful to ambulate in setting of shaking episodes. Consider possible psych component.  -will continue to monitor. -daily bmp  Pressure ulcer of coccygeal region, stage 2 Secondary to patient's bedrest from her persistent weakness.  -wound care consulted, santyl to wounds, cover with saline gauze, ABD pad. Daily dressing changes.   Esophageal Candidiasis  Continue oral fluconazole -pending tacrolimus levels   Normocytic Anemia Hx of Iron Deficiency Anemia Hx of Femoral Vein DVT Hgb 7.9>7.8. EGD with no clear etiology for blood loss. Suspect secondary to chronic wound, malignancy, and malnutrition. Patient to be switched back to oral anticoagulation today per pharmacy.  -daily CBC -transfuse if under 7  Nausea/Vomiting Patient without emesis today. Tolerating diet well.   -PRN zofran -Continue to monitor -Renal diet -QTc normal at this time, if nauseated will add anti-emetic   Kidney Failure s/p kidney transplant 2001 and 2014 Cr stable at 3.03. Nephro document renal function overall stable and to continue current transplant medications. They will sign off at this time.  Hx of Femoral Vein DVT -continue home apixaban  Diet: Renal IVF: None,None VTE: None in setting of anemia Code: Full   Dispo: Anticipated discharge pending CIR placement [place] [facility] Resident PGY-1 Pager [phone] Please contact the on call pager after 5 pm and on weekends at [phone].

## 2020-04-28 NOTE — Progress Notes (Signed)
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 Of note pt with similar symptoms and presented to [facility] ED where she also developed AKI and a large deep wound of her right labia and inner thigh. Imaging was performed which revealed large right perineal mass with new extension into surrounding soft tissue with stranding and gas. No abscess collection noted. She was evaluated by gen surg and gyn/oncology who recommended IV abx alone. Pt now admitted for further work up. CT head with no acute findings.    PT Comments    Pt progressing towards physical therapy goals. Motivated to work with therapy however requires increased encouragement and reassurance throughout mobility due to fear of falling. Pt was issued a gait belt to use with nursing staff for return to bed later this afternoon as she reports feeling more secure when gait belt is being used. Will continue to follow and progress as able per POC.     Follow Up Recommendations  Supervision for mobility/OOB;CIR     Equipment Recommendations  Rolling walker with 5" wheels    Recommendations for Other Services Rehab consult     Precautions / Restrictions Precautions Precautions: Fall Precaution Comments: recent falls Restrictions Weight Bearing Restrictions: No    Mobility  Bed Mobility Overal bed mobility: Needs Assistance Bed Mobility: Supine to Sit     Supine to sit: Min assist     General bed mobility comments: Able to advance BLE toward EOB with HOB significantly elevated. Increased time/effort to bring trunk upright.  Transfers Overall transfer level: Needs assistance Equipment  used: Rolling walker (2 wheeled) Transfers: Sit to/from [facility] Sit to Stand: Min assist Stand pivot transfers: Min assist;+2 safety/equipment       General transfer comment: Progressed from EOB to recliner with Min A +2. Patient not reliant on +2 external assist but helpful for patient peace of mind.  Ambulation/Gait             General Gait Details: Deferred   Stairs             Wheelchair Mobility    Modified Rankin (Stroke Patients Only)       Balance Overall balance assessment: Needs assistance Sitting-balance support: Feet supported Sitting balance-Leahy Scale: Fair Sitting balance - Comments: Able to maintain static sitting balance at EOB with perference to unilateral UE support on bed surface.   Standing balance support: Bilateral upper extremity supported Standing balance-Leahy Scale: Poor Standing balance comment: Reliant on UE support or RW.                            Cognition Arousal/Alertness: Awake/alert Behavior During Therapy: Flat affect;WFL for tasks assessed/performed Overall Cognitive Status: Within Functional Limits for tasks assessed                                 General Comments: Patient tearful throughout. Anxiety related to standing/ambulating 2/2 fear of convulsions.      Exercises      General Comments General comments (skin integrity, edema, etc.): Replaced dressing on wound  on buttock during peri-care      Pertinent Vitals/Pain Pain Assessment: Faces Faces Pain Scale: Hurts little more Pain Location: L hip/groin with LB dressing. Pain Descriptors / Indicators: Grimacing;Crying Pain Intervention(s): Limited activity within patient's tolerance;Monitored during session;Repositioned    Home Living                      Prior Function            PT Goals (current goals can now be found in the care plan section) Acute Rehab PT Goals Patient Stated Goal: get  home PT Goal Formulation: With patient Time For Goal Achievement: 05/05/20 Potential to Achieve Goals: Fair Progress towards PT goals: Progressing toward goals    Frequency    Min 3X/week      PT Plan Current plan remains appropriate    Co-evaluation              AM-PAC PT "6 Clicks" Mobility   Outcome Measure  Help needed turning from your back to your side while in a flat bed without using bedrails?: A Little Help needed moving from lying on your back to sitting on the side of a flat bed without using bedrails?: A Lot Help needed moving to and from a bed to a chair (including a wheelchair)?: A Lot Help needed standing up from a chair using your arms (e.g., wheelchair or bedside chair)?: A Lot Help needed to walk in hospital room?: A Lot Help needed climbing 3-5 steps with a railing? : Total 6 Click Score: 12    End of Session Equipment Utilized During Treatment: Gait belt Activity Tolerance:  (limited by anxiety) Patient left: in bed Nurse Communication: Mobility status PT Visit Diagnosis: Unsteadiness on feet (R26.81)     Time: 9741-6384 PT Time Calculation (min) (ACUTE ONLY): 36 min  Charges:  $Gait Training: 23-37 mins                     Rolinda Roan, PT, DPT Acute Rehabilitation Services Pager: [phone] Office: [phone]    Thelma Comp 04/28/2020, 3:45 PM

## 2020-04-28 NOTE — Progress Notes (Signed)
Patient ID: Christina Mack, female   DOB: December 28, 1965, 55 y.o.   MRN: [id number]  This NP visited patient at the bedside as a follow up for palliative medicine needs and emotional support. o   Created space and opportunity for patient to explore her thoughts and feelings regarding her current medical situation. She is calm and soft spoken. Patient verbalizes no concerns and is "just taking it one day at at time"  Patient tells me she prefers to stay here at [facility] rather than seek transfer to [facility] or [facility] where she has had previous care.  I discussed with the attending team the patient's need for comprehensive care including all specialties specific to;  nephrology, OB/GYN, oncology, infectious disease, orthopedics.  Recommended continuous team meetings knowing this is a complicated case and will likely be a long hospital stay.  Nursing has voiced concern the patient is not allowing them to help with personal care and wound care.   I discussed with [name];  education offered on the importance of hygiene in wound healing.  Offered to phone her daughter for continuity in communication but [name] declined the offer.  Discussed with patient the importance of continued conversation with her family and the  medical providers regarding overall plan of care and treatment options,  ensuring decisions are within the context of the patients values and GOCs.  Questions and concerns addressed   Discussed with attending team via secure chat  Total time spent on the unit was 35 minutes   PMT will continue to support holistically  Greater than 50% of the time was spent in counseling and coordination of care  Wadie Lessen NP  Palliative Medicine Team Team Phone # [phone] Pager [phone]

## 2020-04-29 DIAGNOSIS — C519 Malignant neoplasm of vulva, unspecified: Secondary | ICD-10-CM | POA: Diagnosis not present

## 2020-04-29 DIAGNOSIS — K651 Peritoneal abscess: Secondary | ICD-10-CM | POA: Diagnosis not present

## 2020-04-29 DIAGNOSIS — N184 Chronic kidney disease, stage 4 (severe): Secondary | ICD-10-CM | POA: Diagnosis not present

## 2020-04-29 LAB — BASIC METABOLIC PANEL
Anion gap: 9 (ref 5–15)
BUN: 53 mg/dL — ABNORMAL HIGH (ref 6–20)
CO2: 21 mmol/L — ABNORMAL LOW (ref 22–32)
Calcium: 8.4 mg/dL — ABNORMAL LOW (ref 8.9–10.3)
Chloride: 103 mmol/L (ref 98–111)
Creatinine, Ser: 2.86 mg/dL — ABNORMAL HIGH (ref 0.44–1.00)
GFR, Estimated: 19 mL/min — ABNORMAL LOW (ref >60)
Glucose, Bld: 76 mg/dL (ref 70–99)
Potassium: 4.4 mmol/L (ref 3.5–5.1)
Sodium: 133 mmol/L — ABNORMAL LOW (ref 135–145)

## 2020-04-29 LAB — CBC
HCT: 25.1 % — ABNORMAL LOW (ref 36.0–46.0)
Hemoglobin: 8 g/dL — ABNORMAL LOW (ref 12.0–15.0)
MCH: 28.2 pg (ref 26.0–34.0)
MCHC: 31.9 g/dL (ref 30.0–36.0)
MCV: 88.4 fL (ref 80.0–100.0)
Platelets: 108 10*3/uL — ABNORMAL LOW (ref 150–400)
RBC: 2.84 MIL/uL — ABNORMAL LOW (ref 3.87–5.11)
RDW: 15.8 % — ABNORMAL HIGH (ref 11.5–15.5)
WBC: 8.2 10*3/uL (ref 4.0–10.5)
nRBC: 0 % (ref 0.0–0.2)

## 2020-04-29 NOTE — Progress Notes (Signed)
HD#9 Subjective:   No acute events overnight.   Patient notes that she is feeling "fine."  We discussed the consulting physicians recommendations from yesterday. She has preferred to complete her dressing changes herself due to pain. We offered pain medication to help so the nursing staff could complete. She would prefer to do dressing changes herself and not take pain medications. Discussed our plan to consult the plastic surgery team today. All questions and concerns addressed.   Objective:  Vital signs in last 24 hours: Vitals:   04/28/20 1236 04/28/20 1636 04/29/20 0041 04/29/20 0627  BP: (!) 133/93 (!) 133/94 (!) 138/103 (!) 134/101  Pulse: 97 (!) 101 99 95  Resp: 18 18 18 18   Temp: 98.4 F (36.9 C) 98.4 F (36.9 C) 98.4 F (36.9 C) 98.5 F (36.9 C)  TempSrc: Oral Oral Oral Oral  SpO2: 98% 100% 100% 100%  Weight:    48.8 kg  Height:       Supplemental O2: Room Air SpO2: 100 %  Constitutional: ill appearing, thin, sitting in bed, in no acute distress Cardiovascular: regular rate and rhythm Pulmonary/Chest: normal work of breathing on room air Abdominal:  non-distended Neurological: alert & oriented x 3 Skin: Dry. Echymosis, Purpura on extremities.  Psych: alert and oriented  Filed Weights   04/27/20 0451 04/28/20 0545 04/29/20 0627  Weight: 51.1 kg 50.4 kg 48.8 kg    No intake or output data in the 24 hours ending 04/29/20 0711 Net IO Since Admission: 3,174.72 mL [04/29/20 0711]  Pertinent Labs: CBC Latest Ref Rng & Units 04/29/2020 04/28/2020 04/27/2020  WBC 4.0 - 10.5 K/uL 8.2 8.5 8.8  Hemoglobin 12.0 - 15.0 g/dL 8.0(L) 7.8(L) 7.9(L)  Hematocrit 36.0 - 46.0 % 25.1(L) 23.6(L) 25.4(L)  Platelets 150 - 400 K/uL 108(L) 96(L) 87(L)    CMP Latest Ref Rng & Units 04/29/2020 04/28/2020 04/27/2020  Glucose 70 - 99 mg/dL 76 80 78  BUN 6 - 20 mg/dL 53(H) 51(H) 46(H)  Creatinine 0.44 - 1.00 mg/dL 2.86(H) 3.03(H) 2.99(H)  Sodium 135 - 145 mmol/L 133(L) 132(L) 130(L)   Potassium 3.5 - 5.1 mmol/L 4.4 4.4 3.9  Chloride 98 - 111 mmol/L 103 101 99  CO2 22 - 32 mmol/L 21(L) 22 21(L)  Calcium 8.9 - 10.3 mg/dL 8.4(L) 8.1(L) 8.2(L)  Total Protein 6.5 - 8.1 g/dL - - -  Total Bilirubin 0.3 - 1.2 mg/dL - - -  Alkaline Phos 38 - 126 U/L - - -  AST 15 - 41 U/L - - -  ALT 0 - 44 U/L - - -    Imaging: MRI Pelvis W/O Contrast 1. Large area of ulceration with sinus tract seen within the right labial fold with diffuse surrounding phlegmon and extensive myositis in the bilateral adductor and anterior muscular compartments of both thighs. 2. There is a multilocular fluid collection extending in the inferior left pelvic soft tissues surrounding the pubic rami and extending to the anterior upper left thigh, consistent with a multilocular abscess. 3. Nondisplaced fracture of the inferior right pubic rami with findings suggestive osteomyelitis involving the pubic rami and anterior pubic symphysis. 4. Findings suggestive of osteomyelitis involving the anterior left pubic symphysis.  Assessment/Plan:   Active Problems:   Weakness   Pressure ulcer of coccygeal region, stage 2 (HCC)   Thrombocytopenia (HCC)   Kidney transplant status   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(HCC)   Esophageal candidiasis (HCC)   Normocytic anemia   CKD (chronic kidney disease) stage 4, GFR 15-29  ml/min ([place])   Wound of right groin   Gastric ulcer without hemorrhage or perforation   Malnutrition of moderate degree   Acute osteomyelitis of right pelvic region and thigh [facility])  Patient Summary: Christina Mack is a 55 y/o F with a PMHx of Stage IIIC Vulvar Cancer s/p chemoradiation, CKD s/p renal transplant (2001, 2014), HTN, HLD, 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   Right Labial Soft Tissue Ulceration w/ Phlegmon/Myositis Suspected Multilocular Abscess Soft Tissue of Pelvis Nondisplaced fracture of  Inferior Right Pubic Rami Suspected Osteomyelitis of Pubic rami, Anterior Pubic Symphysis 04/29/20 - Case discussed with Orthopedics, they do not see themselves having a role in patient's care as they state bony involvement appears chronic.  04/29/20 - Discussed case with Dr. Delsa Sale of Gyn/Onc, her recommendations per her note are consider debridement, consult w/ ID for abx management and to continue wound care with consideration of hyperbaric oxygen. She does not believe much utility of IR guided biopsy. Suggested patient f/u with rad-onc outpatient.  Will plan to reach out to Dr. Delsa Sale and discuss with her further if she believes the debridement is something that can be performed by herself, one of her partners, or a different surgical specialty. -consults as per above -hold abx for now, stable and awaiting potential cultures by specialists -BC negative 24 hrs -discussed plan with patient/family  -follow wound care recommendations -completed course of levofloxacin and metronidazole on 01/22  Stage 3C Vulvar Cancer Patient scheduled for outpatient PET scan by [facility]. Patient's daughter notes patient has been seen once by [facility]yn[facility] and has not seen by [facility]yn/Onc in over 15 yrs. Dr. Delsa Sale of [facility] consulted, recommendations as per above. Discussed case with palliative yesterday, believe patient needs multi-disciplinary care with good communication between specialists.  -continue palliative care discussions -continue goals of care discussions.   Generalized Weakness Moderate Malnutrition 2/2 Malignancy/Chronic Illness Multifactorial etiology; deconditioning, malnutrition, recent 2 week hospitalization, severe right labial wound and hx of cancer. Moderate malnutrition.  -daily BMP -zofran PRN -nutrition recommendation - Boost, Prosource, encourage PO intake -continue to work with PT/OT -CIR consult in progress  Left Upper Leg Pain Patient with left  upper thigh pain that has improved. Pubic rami fractures may be contributing. -Ortho consulted, recommendations as per above. -muscle rub PRN -tylenol 100 mg TID scheduled -PT/OT  Shaking Episodes No new shaking episodes past few days. Possibly due to infection from extensive wound. Also checking tacrolimus levels in setting of starting fluconazole therapy. Patient fearful to ambulate in setting of shaking episodes. Consider possible psych component.  -will continue to monitor. -daily bmp  Pressure ulcer of coccygeal region, stage 2 Secondary to patient's bedrest from her persistent weakness.  -wound care consulted, santyl to wounds, cover with saline gauze, ABD pad. Daily dressing changes.   Esophageal Candidiasis  Continue oral fluconazole -pending tacrolimus levels   Normocytic Anemia Hx of Iron Deficiency Anemia Hx of Femoral Vein DVT Hgb 8.0. EGD with no clear etiology for blood loss. Suspect secondary to chronic wound, malignancy, and malnutrition. Patient to be switched back to oral anticoagulation today per pharmacy.  -daily CBC -transfuse if under 7  Nausea/Vomiting Patient without emesis past few days Tolerating diet well.   -PRN zofran -Continue to monitor -Renal diet -QTc normal at this time, if nauseated will add anti-emetic   Kidney Failure s/p kidney transplant 2001 and 2014 Cr  stable at 2.86. Nephro document renal function overall stable and to continue current transplant medications. They will sign off at this time.  Hx of Femoral Vein DVT -continue home apixaban  Diet: Renal IVF: None,None VTE: None in setting of anemia Code: Full   Dispo: Anticipated discharge pending CIR placement [facility] Resident PGY-1 Pager [phone] Please contact the on call pager after 5 pm and on weekends at [phone].

## 2020-04-29 NOTE — Progress Notes (Signed)
[place] for Infectious Disease    Date of Admission:  04/20/2020     ID: Christina Mack is a 55 y.o. female with   Active Problems:   Weakness   Pressure ulcer of coccygeal region, stage 2 (HCC)   Thrombocytopenia (HCC)   Kidney transplant status   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(HCC)   Esophageal candidiasis (HCC)   Normocytic anemia   CKD (chronic kidney disease) stage 4, GFR 15-29 ml/min (HCC)   Wound of right groin   Gastric ulcer without hemorrhage or perforation   Malnutrition of moderate degree   Acute osteomyelitis of right pelvic region and thigh (HCC)    Subjective: Afebrile, tolerating oral intake without significant nausea  Spoke with gyn consultant - did not feel that debridement of phlegmon would be useful due to friable underlying tissue, but possibly would be better candidate for getting HBO. Patient reported that she was planning to return back to [facility] to dr Christina Mack, the consultant. Recommended IR to see if can sample any abscess to help with abtx targets. At this time, no plans for surgical debridement of large labial ulcer  Medications:  . (feeding supplement) PROSource Plus  30 mL Oral TID BM  . sodium chloride   Intravenous Once  . acetaminophen  1,000 mg Oral TID  . apixaban  2.5 mg Oral BID  . collagenase   Topical Daily  . feeding supplement  1 Container Oral TID BM  . fluconazole  100 mg Oral Daily  . magnesium oxide  400 mg Oral BID  . methadone  2.5 mg Oral Q12H  . ondansetron (ZOFRAN) IV  4 mg Intravenous Once  . pantoprazole  40 mg Oral BID AC  . predniSONE  5 mg Oral Q breakfast  . tacrolimus  0.5 mg Oral BID    Objective: Vital signs in last 24 hours: Temp:  [98.4 F (36.9 C)-98.7 F (37.1 C)] 98.7 F (37.1 C) (01/27 1325) Pulse Rate:  [95-100] 100 (01/27 1325) Resp:  [18-20] 20 (01/27 1325) BP: (124-138)/(84-103) 124/84 (01/27 1325) SpO2:  [100 %] 100 % (01/27 1325) Weight:  [48.8 kg] 48.8 kg (01/27 0627) Physical Exam   Constitutional:  oriented to person, place, and time. Appears chronically ill and frail. No distress.  HENT: [place]/AT, PERRLA, no scleral icterus Mouth/Throat: Oropharynx is clear and moist. No oropharyngeal exudate.  Neurological: alert and oriented to person, place, and time.  Skin: Skin is warm and dry. No rash noted. No erythema.  Psychiatric: flat affect.  behavior is normal.    Lab Results Recent Labs    04/28/20 0203 04/29/20 0232  WBC 8.5 8.2  HGB 7.8* 8.0*  HCT 23.6* 25.1*  NA 132* 133*  K 4.4 4.4  CL 101 103  CO2 22 21*  BUN 51* 53*  CREATININE 3.03* 2.86*   Liver Panel No results for input(s): PROT, ALBUMIN, AST, ALT, ALKPHOS, BILITOT, BILIDIR, IBILI in the last 72 hours. Sedimentation Rate Recent Labs    04/27/20 0257  ESRSEDRATE 73*   C-Reactive Protein Recent Labs    04/27/20 0257  CRP 5.4*    Microbiology: Blood cx 1/25 ngtd Studies/Results: No results found.   Assessment/Plan: Pelvic abscess/osteomyelitis = recommend to have IR review films to see if any fluid collections can be sent for culture to have narrow abtx spectrum. The make referral to wound care center htat could provide HBO therapy. Awaiting culture results to decide to do course of iv therapy vs. Longer course  of oral suppression  Stage 4 vulvar cancer =determine where patient would like to seek care so that to decide to transfer her care to gyn-onc team locally vs. Returning back to [facility].  CKD 4 with hx of renal transplant = at her baseline  [facility] Cell: [phone] Pager: [phone]  04/29/2020, 5:07 PM

## 2020-04-29 NOTE — Progress Notes (Signed)
Inpatient Rehabilitation-Admissions Coordinator   Continue to follow for insurance determination as well as medical readiness.  Raechel Ache, OTR/L  Rehab Admissions Coordinator  [phone] 04/29/2020 3:48 PM

## 2020-04-30 ENCOUNTER — Inpatient Hospital Stay (HOSPITAL_COMMUNITY)

## 2020-04-30 DIAGNOSIS — S31109A Unspecified open wound of abdominal wall, unspecified quadrant without penetration into peritoneal cavity, initial encounter: Secondary | ICD-10-CM

## 2020-04-30 HISTORY — PX: IR US GUIDE BX ASP/DRAIN: IMG2392

## 2020-04-30 LAB — CBC
HCT: 26.6 % — ABNORMAL LOW (ref 36.0–46.0)
Hemoglobin: 8 g/dL — ABNORMAL LOW (ref 12.0–15.0)
MCH: 27.5 pg (ref 26.0–34.0)
MCHC: 30.1 g/dL (ref 30.0–36.0)
MCV: 91.4 fL (ref 80.0–100.0)
Platelets: 108 10*3/uL — ABNORMAL LOW (ref 150–400)
RBC: 2.91 MIL/uL — ABNORMAL LOW (ref 3.87–5.11)
RDW: 15.9 % — ABNORMAL HIGH (ref 11.5–15.5)
WBC: 8.9 10*3/uL (ref 4.0–10.5)
nRBC: 0 % (ref 0.0–0.2)

## 2020-04-30 LAB — BASIC METABOLIC PANEL
Anion gap: 13 (ref 5–15)
BUN: 52 mg/dL — ABNORMAL HIGH (ref 6–20)
CO2: 18 mmol/L — ABNORMAL LOW (ref 22–32)
Calcium: 8.5 mg/dL — ABNORMAL LOW (ref 8.9–10.3)
Chloride: 101 mmol/L (ref 98–111)
Creatinine, Ser: 2.78 mg/dL — ABNORMAL HIGH (ref 0.44–1.00)
GFR, Estimated: 20 mL/min — ABNORMAL LOW (ref >60)
Glucose, Bld: 120 mg/dL — ABNORMAL HIGH (ref 70–99)
Potassium: 4.6 mmol/L (ref 3.5–5.1)
Sodium: 132 mmol/L — ABNORMAL LOW (ref 135–145)

## 2020-04-30 LAB — TACROLIMUS LEVEL: Tacrolimus (FK506) - LabCorp: 6.8 ng/mL (ref 2.0–20.0)

## 2020-04-30 IMAGING — US IR US GUIDANCE
1 series · 3 of 3 positions shown · non-contrast
Comparison: none

INDICATION: 54-year-old female with multifocal infection of the pelvis, presents
for aspirate for culture

[Series 1: ir us guidance · 3 of 3 slices shown]
[im 1/3]
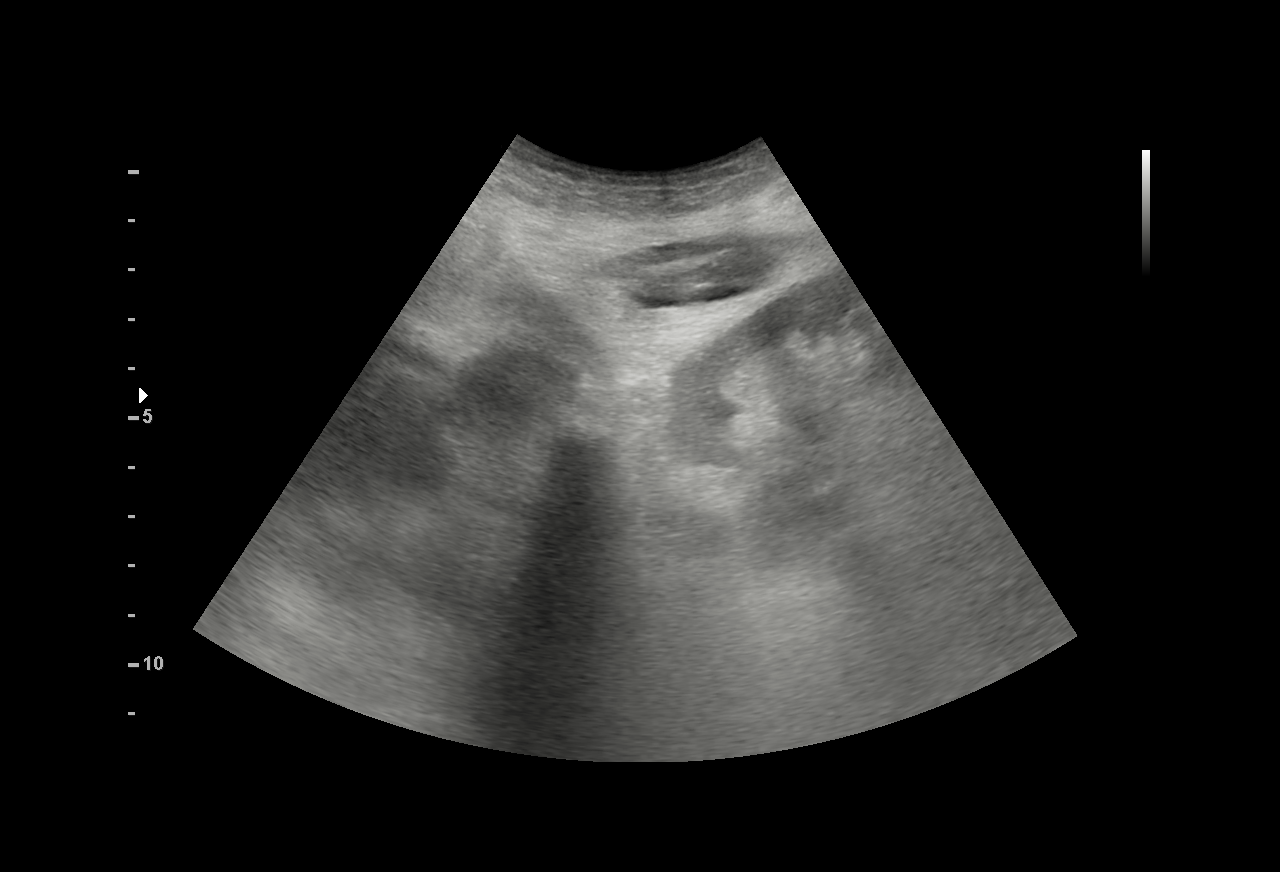
[im 2/3]
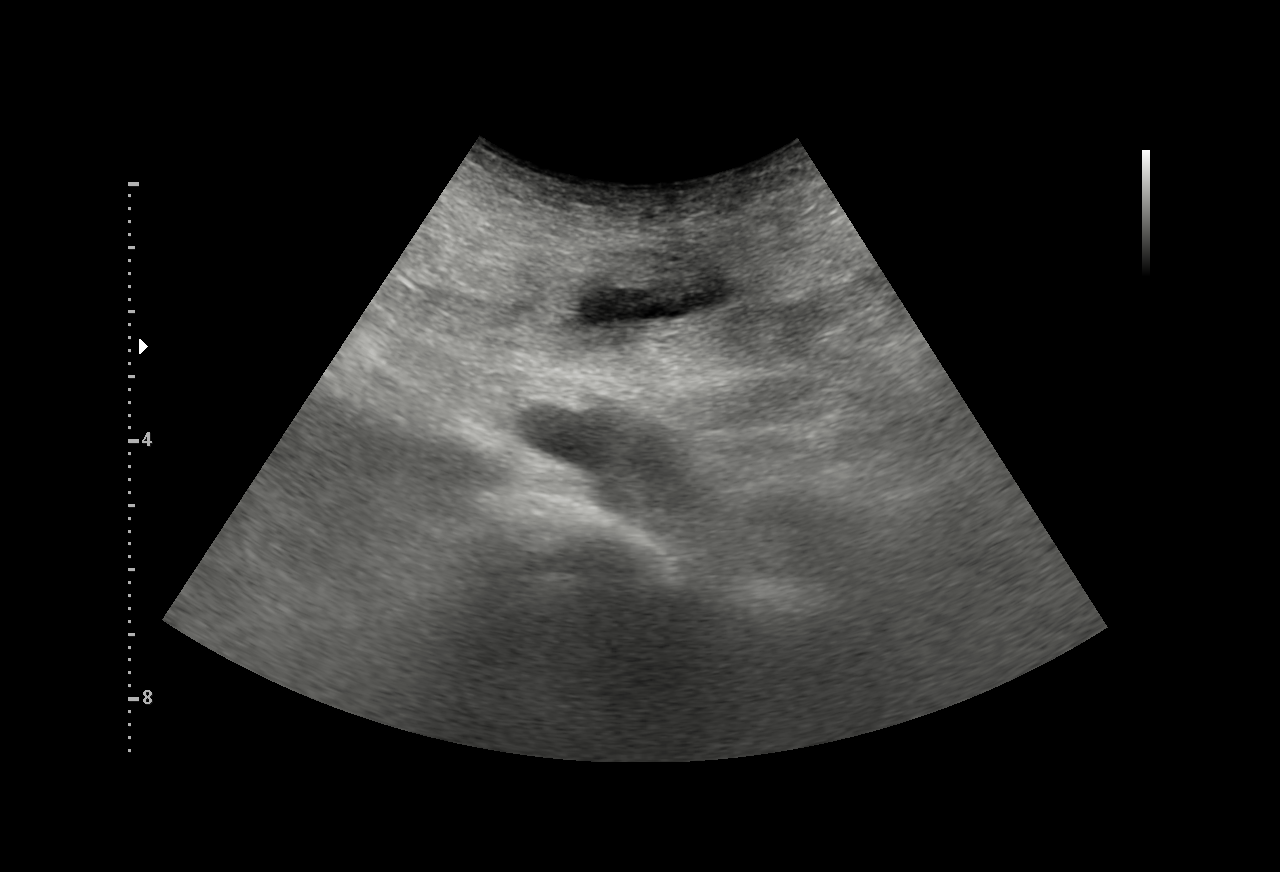
[im 3/3]
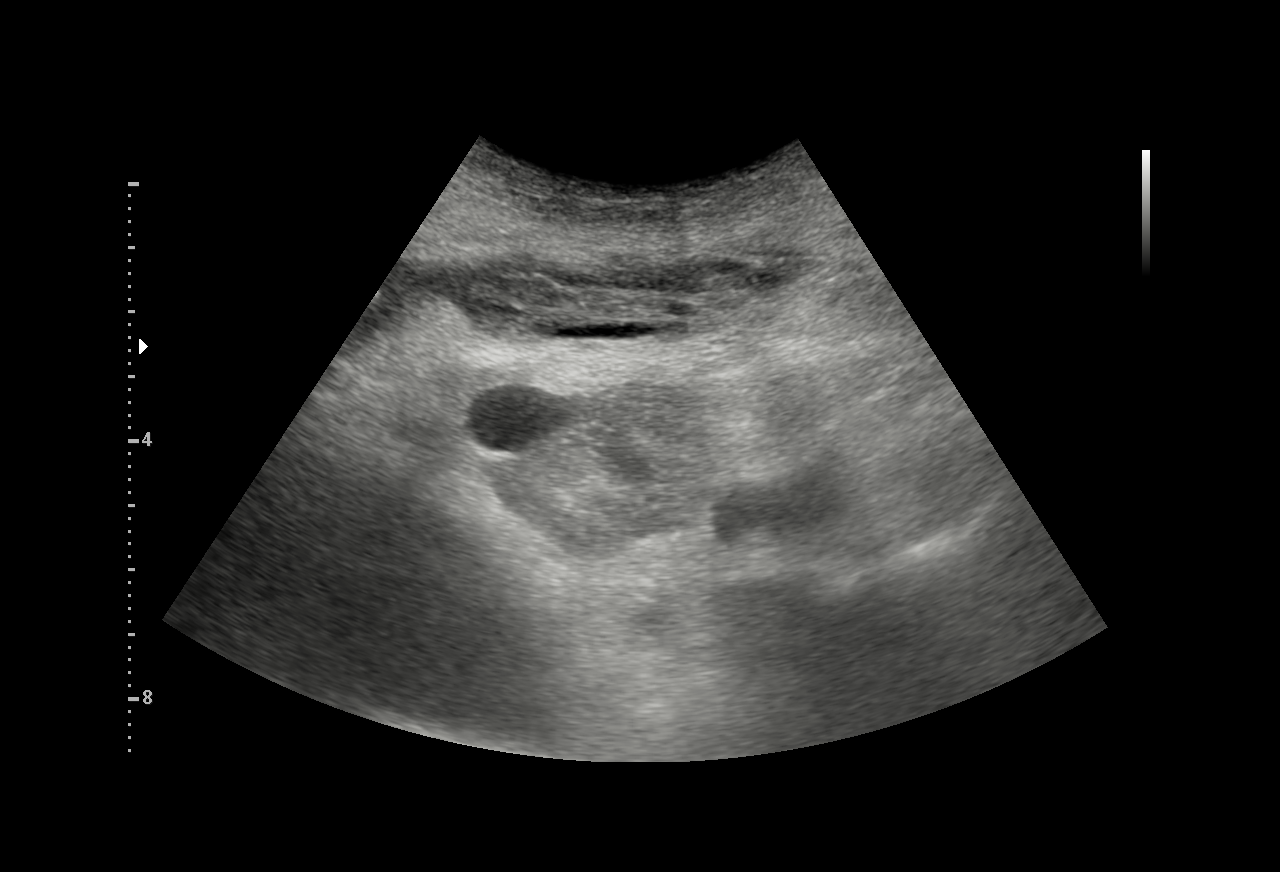

[3 of 3 positions shown; findings below may reference images not displayed]

EXAM:
ULTRASOUND-GUIDED ASPIRATE OF SUPERFICIAL FLUID COLLECTION

MEDICATIONS:
None

ANESTHESIA/SEDATION:
None

COMPLICATIONS:
None

PROCEDURE:
Informed written consent was obtained from the patient after a
thorough discussion of the procedural risks, benefits and
alternatives. All questions were addressed. Maximal Sterile Barrier
Technique was utilized including caps, mask, sterile gowns, sterile
gloves, sterile drape, hand hygiene and skin antiseptic. A timeout
was performed prior to the initiation of the procedure.

Patient positioned supine position on the ultrasound stretcher in
the IR [HOSPITAL].

Ultrasound survey was performed confirming fluid within the anterior
abdominal wall. Correlation with the MRI matches the site. On
ultrasound, this fluid is complex with internal echoes.

1% lidocaine was used for local anesthesia. A Yueh needle was
advanced into the anterior fluid collection with aspiration
approximately 2-3 cc of purulent fluid.

Sample was sent for culture.

Sterile bandage was placed.

Patient tolerated procedure well and remained hemodynamically stable
throughout.

No complications were encountered and no significant blood loss.
IMPRESSION: Status post ultrasound-guided aspirate of anterior abdominal wall
fluid collection. Sample sent for culture.

## 2020-04-30 MED ORDER — LIDOCAINE HCL 1 % IJ SOLN
INTRAMUSCULAR | Status: AC
  Filled 2020-04-30: qty 20

## 2020-04-30 MED ORDER — LIDOCAINE HCL 1 % IJ SOLN
INTRAMUSCULAR | Status: DC | PRN
  Administered 2020-04-30: 10 mL

## 2020-04-30 NOTE — Progress Notes (Signed)
Patient ID: Christina Mack, female   DOB: 26-Apr-1965, 55 y.o.   MRN: [id number] Request received for image guided aspiration of pelvic/thigh fluid collection in patient.  She is a prior history of stage IV vulvar cancer, HTN,HLD, right inner thigh/groin wound, femoral vein DVT on eliquis, chronic kidney disease with prior renal transplant, and recent MRI of the pelvis revealing:  1. Large area of ulceration with sinus tract seen within the right labial fold with diffuse surrounding phlegmon and extensive myositis in the bilateral adductor and anterior muscular compartments of both thighs. 2. There is a multilocular fluid collection extending in the inferior left pelvic soft tissues surrounding the pubic rami and extending to the anterior upper left thigh, consistent with a multilocular abscess. 3. Nondisplaced fracture of the inferior right pubic rami with findings suggestive osteomyelitis involving the pubic rami and anterior pubic symphysis. 4. Findings suggestive of osteomyelitis involving the anterior left pubic symphysis  She is afebrile, WBC normal, blood cultures from 1/25 neg to date; imaging studies were reviewed by Dr. Earleen Newport and d/w Dr. Baxter Flattery.  Details/risks of procedure, including but not limited to, internal bleeding, infection, injury to adjacent structures discussed with patient and daughter with their understanding and consent.  Procedure will be performed with local anesthesia only and tent scheduled for later this afternoon.

## 2020-04-30 NOTE — Progress Notes (Signed)
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 MRI of pelvis on 04/26/2020 showing large area of ulceration with sinus tract seen within the right labial fold, extensive myositis, multilocular fluid collection extending in the inferior left pelvic soft tissues consistent with a multilocular abscess, nondisplaced fracture of the inferior right pubic rami with findings suggestive of osteomyelitis involving the pubic rami and anterior pubic symphysis, also has findings suggestive of osteomyelitis within the anterior left pubic symphysis. 05/01/19 plan for IR aspiration of area of abscess for culture data to tailor antibiotic therapy    PT Comments    Pt received in supine, flat affect but with good participation and tolerance for mobility. Primary session focus on progressing standing tolerance and pre-gait/standing exercises to improve BLE strength. Pt performed standing BLE AROM exercises with good tolerance, able to perform 20-30 reps prior to needing seated break. Pt needing min guard to minA for bed mobility and transfers, but will likely need increased assist from lower chair height. Session time limited due to arrival of transport tech to take pt to IR dept. Pt continues to benefit from PT services to progress toward functional mobility goals. Continue to recommend CIR.  Follow Up Recommendations  Supervision for mobility/OOB;CIR     Equipment Recommendations  Rolling walker with 5" wheels    Recommendations for Other Services Rehab consult     Precautions / Restrictions Precautions Precautions: Fall Precaution  Comments: recent falls Restrictions Weight Bearing Restrictions: No    Mobility  Bed Mobility Overal bed mobility: Needs Assistance Bed Mobility: Supine to Sit;Sit to Supine     Supine to sit: Min guard Sit to supine: Min assist   General bed mobility comments: Log roll toward R EOB with HOB partially elevated. Increased time/effort to bring trunk upright. Needs BLE assist to return to supine.  Transfers Overall transfer level: Needs assistance Equipment used: Rolling walker (2 wheeled) Transfers: Sit to/from [facility] Sit to Stand: Min assist Stand pivot transfers: Min assist      Lateral/Scoot Transfers: Min assist General transfer comment: from slightly elevated EOB height to RW x3 reps, pt with good sequencing/safety awareness but anxious/fearful of falling  Ambulation/Gait Ambulation/Gait assistance: Min assist Gait Distance (Feet): 3 Feet Assistive device: Rolling walker (2 wheeled) Gait Pattern/deviations: Step-to pattern;Shuffle     General Gait Details: lateral stepping toward HOB ~58ft using RW and pre-gait hip flexion x10 reps bilaterally x2 sets; further gait deferred due to arrival of transport   Stairs             Wheelchair Mobility    Modified Rankin (Stroke Patients Only)       Balance Overall balance assessment: Needs assistance Sitting-balance support: Feet supported Sitting balance-Leahy Scale: Fair Sitting balance - Comments: Able to maintain static sitting balance at EOB with perference to unilateral UE support on bed surface.   Standing balance support: Bilateral upper extremity supported Standing balance-Leahy Scale: Poor Standing balance comment: Reliant on UE support of RW  Cognition Arousal/Alertness: Awake/alert Behavior During Therapy: Flat affect;WFL for tasks assessed/performed Overall Cognitive Status: Within Functional Limits for tasks assessed                                  General Comments: Fearful of falls, agreeable to standing exercises and pre-gait training at bedside; flat affect      Exercises Other Exercises Other Exercises: STS x5 reps, standing BLE AROM: hip flexion 2x10 reps, mini squats x10 reps, heel raises x10 reps    General Comments General comments (skin integrity, edema, etc.): pt with some drainage noted to bed pad, new paper pad placed while pt standing at bedside      Pertinent Vitals/Pain Pain Assessment: Faces Faces Pain Scale: Hurts little more Pain Location: L hip/groin with standing and log rolling transfer to EOB Pain Descriptors / Indicators: Grimacing;Guarding Pain Intervention(s): Monitored during session;Repositioned    Home Living                      Prior Function            PT Goals (current goals can now be found in the care plan section) Acute Rehab PT Goals Patient Stated Goal: get home PT Goal Formulation: With patient Time For Goal Achievement: 05/05/20 Potential to Achieve Goals: Fair Progress towards PT goals: Progressing toward goals    Frequency    Min 3X/week      PT Plan Current plan remains appropriate    Co-evaluation              AM-PAC PT "6 Clicks" Mobility   Outcome Measure  Help needed turning from your back to your side while in a flat bed without using bedrails?: A Little Help needed moving from lying on your back to sitting on the side of a flat bed without using bedrails?: A Little Help needed moving to and from a bed to a chair (including a wheelchair)?: A Lot Help needed standing up from a chair using your arms (e.g., wheelchair or bedside chair)?: A Lot (needs elevated bed height) Help needed to walk in hospital room?: A Lot Help needed climbing 3-5 steps with a railing? : Total 6 Click Score: 13    End of Session Equipment Utilized During Treatment: Gait belt Activity Tolerance: Patient tolerated treatment well;Patient limited by  fatigue (limited by anxiety) Patient left: in bed;with call bell/phone within reach;Other (comment) (in care of transport tech) Nurse Communication: Mobility status PT Visit Diagnosis: Unsteadiness on feet (R26.81)     Time: 6213-0865 PT Time Calculation (min) (ACUTE ONLY): 12 min  Charges:  $Therapeutic Exercise: 8-22 mins                     Genee Rann P., PTA Acute Rehabilitation Services Pager: [phone] Office: [place] 04/30/2020, 4:32 PM

## 2020-04-30 NOTE — Procedures (Signed)
Interventional Radiology Procedure Note  Procedure: Image guided aspiration, low anterior left abdominal wall ~3cc fluid aspiration.  Complications: None  EBL: None Sample: Culture sent  Recommendations: - Routine wound - follow up Cx    Signed,  Dulcy Fanny. Earleen Newport, DO

## 2020-04-30 NOTE — Consult Note (Signed)
[facility]  Reason for Consult: Groin/labial wound Referring Physician: Dr. Johnney Ou, Dr. Angelia Mould  Christina Mack is an 55 y.o. female.  HPI: 55 year old female with past medical history of vulvar cancer, CKD status post transplant at [facility], hypertension, hyperlipidemia, history of DVT, extensive wound of her right inner thigh/groin secondary to her vulvar cancer.  Patient evaluated at bedside today, she reports that she is doing well today.  She reports that she has been doing dressing changes on her own while hospitalized.  She reports she is comfortable doing this.  She has no complaints at this time.  Patient had MRI of pelvis on 04/26/2020 showing large area of ulceration with sinus tract seen within the right labial fold, extensive myositis, multilocular fluid collection extending in the inferior left pelvic soft tissues consistent with a multilocular abscess, nondisplaced fracture of the inferior right pubic rami with findings suggestive of osteomyelitis involving the pubic rami and anterior pubic symphysis, also has findings suggestive of osteomyelitis within the anterior left pubic symphysis.  WBC 8.9   Past Medical History:  Diagnosis Date  . Complication of anesthesia    nausea and vomiting    Past Surgical History:  Procedure Laterality Date  . BIOPSY  04/24/2020   Procedure: BIOPSY;  Surgeon: Doran Stabler, MD;  Location: [facility];  Service: Gastroenterology;;  . ESOPHAGOGASTRODUODENOSCOPY (EGD) WITH PROPOFOL N/A 04/24/2020   Procedure: ESOPHAGOGASTRODUODENOSCOPY (EGD) WITH PROPOFOL;  Surgeon: Doran Stabler, MD;  Location: [facility];  Service: Gastroenterology;  Laterality: N/A;    History reviewed. No pertinent family history.  Social History:  reports that she has never smoked. She has never used smokeless tobacco. No history on file for alcohol use and drug use.  Allergies:  Allergies  Allergen Reactions  . Codeine     vomiting  .  Hydromorphone Nausea And Vomiting  . Oxycodone-Acetaminophen Itching, Nausea Only, Other (See Comments) and Nausea And Vomiting    Other Reaction: HA    Medications: I have reviewed the patient's current medications.  Results for orders placed or performed during the hospital encounter of 04/20/20 (from the past 48 hour(s))  CBC     Status: Abnormal   Collection Time: 04/29/20  2:32 AM  Result Value Ref Range   WBC 8.2 4.0 - 10.5 K/uL   RBC 2.84 (L) 3.87 - 5.11 MIL/uL   Hemoglobin 8.0 (L) 12.0 - 15.0 g/dL   HCT 25.1 (L) 36.0 - 46.0 %   MCV 88.4 80.0 - 100.0 fL   MCH 28.2 26.0 - 34.0 pg   MCHC 31.9 30.0 - 36.0 g/dL   RDW 15.8 (H) 11.5 - 15.5 %   Platelets 108 (L) 150 - 400 K/uL    Comment: REPEATED TO VERIFY Immature Platelet Fraction may be clinically indicated, consider ordering this additional test [id number] CONSISTENT WITH PREVIOUS RESULT    nRBC 0.0 0.0 - 0.2 %    Comment: Performed at [facility], [place] [address], [place], [place] [address]  Basic metabolic panel     Status: Abnormal   Collection Time: 04/29/20  2:32 AM  Result Value Ref Range   Sodium 133 (L) 135 - 145 mmol/L   Potassium 4.4 3.5 - 5.1 mmol/L   Chloride 103 98 - 111 mmol/L   CO2 21 (L) 22 - 32 mmol/L   Glucose, Bld 76 70 - 99 mg/dL    Comment: Glucose reference range applies only to samples taken after fasting for at least  8 hours.   BUN 53 (H) 6 - 20 mg/dL   Creatinine, Ser 2.86 (H) 0.44 - 1.00 mg/dL   Calcium 8.4 (L) 8.9 - 10.3 mg/dL   GFR, Estimated 19 (L) >60 mL/min    Comment: (NOTE) Calculated using the CKD-EPI Creatinine Equation (2021)    Anion gap 9 5 - 15    Comment: Performed at [facility] [address] [place], [place] [address]  CBC     Status: Abnormal   Collection Time: 04/30/20  7:17 AM  Result Value Ref Range   WBC 8.9 4.0 - 10.5 K/uL   RBC 2.91 (L) 3.87 - 5.11 MIL/uL   Hemoglobin 8.0 (L) 12.0 - 15.0 g/dL   HCT 26.6 (L) 36.0 - 46.0 %   MCV 91.4 80.0 - 100.0  fL   MCH 27.5 26.0 - 34.0 pg   MCHC 30.1 30.0 - 36.0 g/dL   RDW 15.9 (H) 11.5 - 15.5 %   Platelets 108 (L) 150 - 400 K/uL    Comment: REPEATED TO VERIFY Immature Platelet Fraction may be clinically indicated, consider ordering this additional test [id number] CONSISTENT WITH PREVIOUS RESULT    nRBC 0.0 0.0 - 0.2 %    Comment: Performed at [facility], [place] [address], [place], [place] [address]  Basic metabolic panel     Status: Abnormal   Collection Time: 04/30/20  7:17 AM  Result Value Ref Range   Sodium 132 (L) 135 - 145 mmol/L   Potassium 4.6 3.5 - 5.1 mmol/L   Chloride 101 98 - 111 mmol/L   CO2 18 (L) 22 - 32 mmol/L   Glucose, Bld 120 (H) 70 - 99 mg/dL    Comment: Glucose reference range applies only to samples taken after fasting for at least 8 hours.   BUN 52 (H) 6 - 20 mg/dL   Creatinine, Ser 2.78 (H) 0.44 - 1.00 mg/dL   Calcium 8.5 (L) 8.9 - 10.3 mg/dL   GFR, Estimated 20 (L) >60 mL/min    Comment: (NOTE) Calculated using the CKD-EPI Creatinine Equation (2021)    Anion gap 13 5 - 15    Comment: Performed at [facility] [address] [place], [place] [address]    No results found.  Review of Systems  Constitutional: Negative.  Negative for chills and fever.  Musculoskeletal: Positive for back pain.   Blood pressure (!) 134/100, pulse 98, temperature 98.3 F (36.8 C), temperature source Oral, resp. rate 18, height 5\' 5"  (1.651 m), weight 47.5 kg, SpO2 99 %. Physical Exam Constitutional:      General: She is not in acute distress.    Comments: Thin female, resting in bed  HENT:     Head: Normocephalic and atraumatic.  Pulmonary:     Effort: Pulmonary effort is normal.  Neurological:     Mental Status: She is alert.  Psychiatric:        Mood and Affect: Mood normal.        Behavior: Behavior normal.    Reviewed EMR photos of patient's groin/labial wound.  Extensive fibrinous exudate noted within the wound bed, wound bed is deep.  There is  some surrounding erythema, but no appearance of cellulitic changes noted.  Assessment/Plan: Groin/labial wound:  Recommend continuing with local wound care per WOC recommendations. Recommend consultation with gyn oncology for possible debridement.  May also need transfer to [facility] for further treatment given the extent of patient's wound and health status.  Would benefit from optimizing nutritional status with multivitamins and protein shakes for optimal healing opportunity.  We will sign off at this time, please reconsult or call with any questions.  Appreciate consultation.  Remain available as needed.  Plan discussed with Dr. Leretha Pol Adan Baehr, PA-C 04/30/2020, 9:25 AM

## 2020-04-30 NOTE — Progress Notes (Signed)
Nutrition Follow-up  DOCUMENTATION CODES:   Non-severe (moderate) malnutrition in context of chronic illness  INTERVENTION:  Continue Boost Breeze po TID, each supplement provides 250 kcal and 9 grams of protein.  Continue 30 ml Prosource plus po TID, each supplement provides 100 kcal and 15 grams of protein.   Encourage adequate PO intake.   NUTRITION DIAGNOSIS:   Moderate Malnutrition related to cancer and cancer related treatments,chronic illness as evidenced by moderate fat depletion,severe muscle depletion; ongoing  GOAL:   Patient will meet greater than or equal to 90% of their needs; progressing  MONITOR:   PO intake,Supplement acceptance,Skin,Weight trends,Labs,I & O's  REASON FOR ASSESSMENT:   Consult Assessment of nutrition requirement/status,Wound healing  ASSESSMENT:   55 y/o F with a PMHx of Stage IIIC Vulvar Cancer s/p chemoradiation, CKD s/p renal transplant (2001, 2014), HTN, HLD, presents with weakness, n/v, and seizure like activity S/p EGD 1/22.   Meal completion has been 50-55%. Pt has been tolerating her PO. Pt currently has Boost Breeze and Prosource Plus ordered with varied consumption. RD to continue with current orders to aid in increased caloric and protein needs as well as in wound healing. RD has additionally ordered Carnation instant breakfast per pt request. Will continue with current orders.    Labs and medications reviewed.   Diet Order:   Diet Order            Diet regular Room service appropriate? Yes; Fluid consistency: Thin  Diet effective now                 EDUCATION NEEDS:   Not appropriate for education at this time  Skin:  Skin Assessment: Skin Integrity Issues: Skin Integrity Issues:: Other (Comment) Stage II: buttocks Other: wound of her right inner thigh/groin secondary to her vulvar cancer  Last BM:  1/27  Height:   Ht Readings from Last 1 Encounters:  04/24/20 5\' 5"  (1.651 m)    Weight:   Wt Readings  from Last 1 Encounters:  04/30/20 47.5 kg    Ideal Body Weight:  56.8 kg  BMI:  Body mass index is 17.43 kg/m.  Estimated Nutritional Needs:   Kcal:  1750-2000  Protein:  85-100 grams  Fluid:  1.2 L/day  Corrin Parker, MS, RD, LDN RD pager number/after hours weekend pager number on Amion.

## 2020-04-30 NOTE — Progress Notes (Signed)
HD#10 Subjective:   No acute events overnight.   Patient notes that she is feeling "ok." Denies new complaints. Report she has had 4 "soft serve" BMs today. No new shaking episodes. States the thought of IR going in and addressing the abscesses is "scary." States she doesn't feel as "blah" anymore now that she is eating a bit more.  Objective:  Vital signs in last 24 hours: Vitals:   04/29/20 1325 04/29/20 1827 04/30/20 0105 04/30/20 0558  BP: 124/84 124/88 (!) 136/97 (!) 134/100  Pulse: 100 (!) 102 91 98  Resp: 20 19 18 18   Temp: 98.7 F (37.1 C) 98.5 F (36.9 C) 98.5 F (36.9 C) 98.3 F (36.8 C)  TempSrc: Oral Oral Oral Oral  SpO2: 100% 99% 98% 99%  Weight:    47.5 kg  Height:       Supplemental O2: Room Air SpO2: 99 %  Constitutional: ill appearing, thin, sitting in bed, in no acute distress Cardiovascular: regular rate and rhythm Pulmonary/Chest: normal work of breathing on room air Abdominal:  non-distended Neurological: alert & oriented x 3 Skin: Dry. Echymosis, Purpura on extremities.  Psych: alert and oriented  Filed Weights   04/28/20 0545 04/29/20 0627 04/30/20 0558  Weight: 50.4 kg 48.8 kg 47.5 kg     Intake/Output Summary (Last 24 hours) at 04/30/2020 1042 Last data filed at 04/30/2020 0802 Gross per 24 hour  Intake 242 ml  Output -  Net 242 ml   Net IO Since Admission: 3,536.72 mL [04/30/20 1042]  Pertinent Labs: CBC Latest Ref Rng & Units 04/30/2020 04/29/2020 04/28/2020  WBC 4.0 - 10.5 K/uL 8.9 8.2 8.5  Hemoglobin 12.0 - 15.0 g/dL 8.0(L) 8.0(L) 7.8(L)  Hematocrit 36.0 - 46.0 % 26.6(L) 25.1(L) 23.6(L)  Platelets 150 - 400 K/uL 108(L) 108(L) 96(L)    CMP Latest Ref Rng & Units 04/30/2020 04/29/2020 04/28/2020  Glucose 70 - 99 mg/dL 120(H) 76 80  BUN 6 - 20 mg/dL 52(H) 53(H) 51(H)  Creatinine 0.44 - 1.00 mg/dL 2.78(H) 2.86(H) 3.03(H)  Sodium 135 - 145 mmol/L 132(L) 133(L) 132(L)  Potassium 3.5 - 5.1 mmol/L 4.6 4.4 4.4  Chloride 98 - 111 mmol/L  101 103 101  CO2 22 - 32 mmol/L 18(L) 21(L) 22  Calcium 8.9 - 10.3 mg/dL 8.5(L) 8.4(L) 8.1(L)  Total Protein 6.5 - 8.1 g/dL - - -  Total Bilirubin 0.3 - 1.2 mg/dL - - -  Alkaline Phos 38 - 126 U/L - - -  AST 15 - 41 U/L - - -  ALT 0 - 44 U/L - - -    Imaging: MRI Pelvis W/O Contrast 1. Large area of ulceration with sinus tract seen within the right labial fold with diffuse surrounding phlegmon and extensive myositis in the bilateral adductor and anterior muscular compartments of both thighs. 2. There is a multilocular fluid collection extending in the inferior left pelvic soft tissues surrounding the pubic rami and extending to the anterior upper left thigh, consistent with a multilocular abscess. 3. Nondisplaced fracture of the inferior right pubic rami with findings suggestive osteomyelitis involving the pubic rami and anterior pubic symphysis. 4. Findings suggestive of osteomyelitis involving the anterior left pubic symphysis.  Assessment/Plan:   Active Problems:   Weakness   Pressure ulcer of coccygeal region, stage 2 (HCC)   Thrombocytopenia (HCC)   Kidney transplant status   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(HCC)   Esophageal candidiasis (HCC)   Normocytic anemia   CKD (chronic kidney disease) stage 4, GFR 15-29  ml/min ([place])   Wound of right groin   Gastric ulcer without hemorrhage or perforation   Malnutrition of moderate degree   Acute osteomyelitis of right pelvic region and thigh [facility])  Patient Summary: Christina Mack is a 55 y/o F with a PMHx of Stage IIIC Vulvar Cancer s/p chemoradiation, CKD s/p renal transplant (2001, 2014), HTN, HLD, 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   Right Labial Soft Tissue Ulceration w/ Phlegmon/Myositis Suspected Multilocular Abscess Soft Tissue of Pelvis Nondisplaced fracture of Inferior Right Pubic Rami Suspected Osteomyelitis of Pubic rami,  Anterior Pubic Symphysis 04/29/20 - Case discussed with Orthopedics, they do not see themselves having a role in patient's care as they state bony involvement appears chronic.  04/29/20 - Discussed case with Dr. Delsa Sale of Gyn/Onc, her recommendations per her note are consider debridement, consult w/ ID for abx management and to continue wound care with consideration of hyperbaric oxygen.  04/30/20 - Case discussed with Dr. Baxter Flattery of ID. Recommended to discuss case with IR to see if possible to drain abscess and obtain culture to better direct abx therapy.  -Consult IR for possible drainage/culture -At discharge, refer patient to wound care center with hyperbaric oxygen therapy -hold abx for now, stable and awaiting potential cultures by specialists -BC negative 3 days -discussed plan with patient/family  -follow wound care recommendations -completed course of levofloxacin and metronidazole on 01/22  Stage 3C Vulvar Cancer Will plan to determine if patient would like to continue her care at [facility] or see outside facility gyn/onc per her preference.  -continue palliative care discussions -continue goals of care discussions.   Generalized Weakness Moderate Malnutrition 2/2 Malignancy/Chronic Illness Multifactorial etiology; deconditioning, malnutrition, recent 2 week hospitalization, severe right labial wound and hx of cancer. Moderate malnutrition.  -daily BMP -zofran PRN -nutrition recommendation - Boost, Prosource, encourage PO intake -continue to work with PT/OT -CIR consult in progress  Left Upper Leg Pain Patient with left upper thigh pain that has improved. Pubic rami fractures may be contributing. -Ortho consulted, recommendations as per above. -muscle rub PRN -tylenol 100 mg TID scheduled -PT/OT  Shaking Episodes No new shaking episodes past few days. Possibly due to infection from extensive wound. Also checking tacrolimus levels in setting of starting fluconazole  therapy. Patient fearful to ambulate in setting of shaking episodes. Consider possible psych component.  -tacrolimus level pending -will continue to monitor. -daily bmp  Pressure ulcer of coccygeal region, stage 2 Secondary to patient's bedrest from her persistent weakness.  -wound care consulted, santyl to wounds, cover with saline gauze, ABD pad. Daily dressing changes.   Esophageal Candidiasis  Continue oral fluconazole -pending tacrolimus levels   Normocytic Anemia Hx of Iron Deficiency Anemia Hx of Femoral Vein DVT Hgb 8.0. EGD with no clear etiology for blood loss. Suspect secondary to chronic wound, malignancy, and malnutrition. Patient to be switched back to oral anticoagulation today per pharmacy.  -daily CBC -transfuse if under 7  Nausea/Vomiting Patient without emesis past few days. Tolerating diet well.   -PRN zofran -Continue to monitor -Renal diet -QTc normal at this time, if nauseated will add anti-emetic   Kidney Failure s/p kidney transplant 2001 and 2014 Cr stable at 2.86. Nephro document renal function overall stable and to continue current transplant medications. They will sign off at this time.  Hx of Femoral Vein DVT -continue home apixaban  Diet: Renal IVF: None,None VTE: None  in setting of anemia Code: Full   Dispo: Anticipated discharge pending CIR placement [facility] Resident PGY-1 Pager [phone] Please contact the on call pager after 5 pm and on weekends at [phone].

## 2020-04-30 NOTE — Progress Notes (Signed)
ID PROGRESS NOTE  Spoke with dr Earleen Newport to see if can aspirate any fluid for aerobic and anaerobic culture to help target therapy. Patient will likely need additional 3-4 wk for IV therapy to treat deep pelvic abscess and osteomyelitis.  Recommend to refer to wound care center closest to her home vs. Seeing if [facility] will be doing so.  Dr Prudencio Pair to follow this weekend.  Elzie Rings [facility] [phone]

## 2020-04-30 NOTE — Progress Notes (Signed)
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 Of note pt with similar symptoms and presented to [facility] ED where she also developed AKI and a large deep wound of her right labia and inner thigh. Imaging was performed which revealed large right perineal mass with new extension into surrounding soft tissue with stranding and gas. No abscess collection noted. She was evaluated by gen surg and gyn/oncology who recommended IV abx alone. Pt now admitted for further work up. CT head with no acute findings.   OT comments  Pt making good progress with functional goals. Pt fearful and anxious of falling and declined ambulation to sink for bathing, however agreeable to stand at EOB with RW at tray table and stood to wash face and UB. Pt seated EOB to don clean gown and don clean socks. OT will continue to follow acutely to maximize level of function and safety  Follow Up Recommendations  CIR    Equipment Recommendations  Wheelchair cushion (measurements OT);Wheelchair (measurements OT);3 in 1 bedside commode;Other (comment)    Recommendations for Other Services      Precautions / Restrictions Precautions Precautions: Fall Precaution Comments: recent falls Restrictions Weight Bearing Restrictions: No       Mobility Bed Mobility Overal bed mobility: Needs Assistance Bed Mobility: Supine to Sit     Supine to sit: Min guard;HOB elevated Sit to supine: Mod assist   General bed mobility comments: mod A with LEs back onto bed  Transfers Overall transfer level: Needs assistance Equipment used: Rolling walker (2 wheeled) Transfers: Sit to/from  [employer] Transfers   Stand pivot transfers: Min assist      Lateral/Scoot Transfers: Min assist General transfer comment: pt declined ambulation to sink for bathing, but agreeable to stand at EOB with tray table    Balance Overall balance assessment: Needs assistance Sitting-balance support: Feet supported Sitting balance-Leahy Scale: Fair     Standing balance support: Bilateral upper extremity supported;During functional activity;No upper extremity supported Standing balance-Leahy Scale: Poor                             ADL either performed or assessed with clinical judgement   ADL       Grooming: Wash/dry face;Wash/dry hands;Min guard;Standing   Upper Body Bathing: Minimal assistance;Standing   Lower Body Bathing: Moderate assistance;Sitting/lateral leans   Upper Body Dressing : Min guard;Sitting   Lower Body Dressing: Maximal assistance;Sitting/lateral leans Lower Body Dressing Details (indicate cue type and reason): socks Toilet Transfer: Minimal assistance;Stand-pivot;BSC   Toileting- Clothing Manipulation and Hygiene: Maximal assistance       Functional mobility during ADLs: Minimal assistance       Vision Patient Visual Report: No change from baseline     Perception     Praxis      Cognition   Behavior During Therapy: Flat affect;WFL for tasks assessed/performed Overall Cognitive Status: Within Functional Limits for tasks assessed                                 General Comments: anxiety about  standing, fear of falling/convulsions if walking with RW        Exercises     Shoulder Instructions       General Comments      Pertinent Vitals/ Pain       Pain Assessment: Faces Faces Pain Scale: Hurts a little bit Pain Location: L hip/groin with LB dressing. Pain Descriptors / Indicators: Grimacing;Guarding Pain Intervention(s): Monitored during session;Repositioned  Home Living                                           Prior Functioning/Environment              Frequency  Min 2X/week        Progress Toward Goals  OT Goals(current goals can now be found in the care plan section)  Progress towards OT goals: Progressing toward goals  Acute Rehab OT Goals Patient Stated Goal: get home  Plan Discharge plan remains appropriate;Frequency remains appropriate    Co-evaluation                 AM-PAC OT "6 Clicks" Daily Activity     Outcome Measure   Help from another person eating meals?: None Help from another person taking care of personal grooming?: A Little Help from another person toileting, which includes using toliet, bedpan, or urinal?: A Lot Help from another person bathing (including washing, rinsing, drying)?: A Lot Help from another person to put on and taking off regular upper body clothing?: A Little Help from another person to put on and taking off regular lower body clothing?: A Lot 6 Click Score: 16    End of Session Equipment Utilized During Treatment: Gait belt;Rolling walker;Other (comment) (BSC)  OT Visit Diagnosis: Other abnormalities of gait and mobility (R26.89);Unsteadiness on feet (R26.81);Muscle weakness (generalized) (M62.81)   Activity Tolerance Patient tolerated treatment well   Patient Left with call bell/phone within reach;in bed   Nurse Communication          Time: 0626-9485 OT Time Calculation (min): 26 min  Charges: OT General Charges $OT Visit: 1 Visit OT Treatments $Self Care/Home Management : 8-22 mins $Therapeutic Activity: 8-22 mins     Britt Bottom 04/30/2020, 3:55 PM

## 2020-05-01 LAB — CBC
HCT: 24.3 % — ABNORMAL LOW (ref 36.0–46.0)
Hemoglobin: 7.4 g/dL — ABNORMAL LOW (ref 12.0–15.0)
MCH: 27.9 pg (ref 26.0–34.0)
MCHC: 30.5 g/dL (ref 30.0–36.0)
MCV: 91.7 fL (ref 80.0–100.0)
Platelets: 113 10*3/uL — ABNORMAL LOW (ref 150–400)
RBC: 2.65 MIL/uL — ABNORMAL LOW (ref 3.87–5.11)
RDW: 15.9 % — ABNORMAL HIGH (ref 11.5–15.5)
WBC: 8.2 10*3/uL (ref 4.0–10.5)
nRBC: 0 % (ref 0.0–0.2)

## 2020-05-01 LAB — BASIC METABOLIC PANEL
Anion gap: 11 (ref 5–15)
BUN: 54 mg/dL — ABNORMAL HIGH (ref 6–20)
CO2: 18 mmol/L — ABNORMAL LOW (ref 22–32)
Calcium: 8.3 mg/dL — ABNORMAL LOW (ref 8.9–10.3)
Chloride: 103 mmol/L (ref 98–111)
Creatinine, Ser: 2.97 mg/dL — ABNORMAL HIGH (ref 0.44–1.00)
GFR, Estimated: 18 mL/min — ABNORMAL LOW (ref >60)
Glucose, Bld: 80 mg/dL (ref 70–99)
Potassium: 5 mmol/L (ref 3.5–5.1)
Sodium: 132 mmol/L — ABNORMAL LOW (ref 135–145)

## 2020-05-01 MED ORDER — ENSURE ENLIVE PO LIQD
237.0000 mL | ORAL | Status: DC
  Administered 2020-05-01: 237 mL via ORAL
  Filled 2020-05-01: qty 237

## 2020-05-01 MED ORDER — BOOST / RESOURCE BREEZE PO LIQD CUSTOM: 1.0000 | ORAL | Status: DC

## 2020-05-01 NOTE — Progress Notes (Signed)
Nutrition Follow-up  DOCUMENTATION CODES:   Non-severe (moderate) malnutrition in context of chronic illness  INTERVENTION:  D/c ProSource Plus d/t poor tolerance  Decrease Boost Breeze to once daily, each supplement provides 250 kcal and 9 grams protein  Continue CIB po daily, each supplement with 237 ml whole milk provides 280 kcal and 13 grams protein  Ensure Enlive po daily, each supplement provides 350 kcal and 20 grams of protein  Magic cup BID with meals, each supplement provides 290 kcal and 9 grams of protein   NUTRITION DIAGNOSIS:   Moderate Malnutrition related to cancer and cancer related treatments,chronic illness as evidenced by moderate fat depletion,severe muscle depletion. -ongoing  GOAL:   Patient will meet greater than or equal to 90% of their needs -progressing  MONITOR:   PO intake,Supplement acceptance,Skin,Weight trends,Labs,I & O's  REASON FOR ASSESSMENT:   Consult  (Pt unable to tolerate ProSource, request for alternate protein supplement)  ASSESSMENT:   55 y/o F with a PMHx of Stage IIIC Vulvar Cancer s/p chemoradiation, CKD s/p renal transplant (2001, 2014), HTN, HLD, presents with weakness, n/v, and seizure like activity  RD working remotely.  Received consult for alternate protein supplement option as pt is unable to tolerate ProSource Plus supplement. Unable to reach pt via phone this afternoon. She has been eating 50-55% of meals and has poor acceptance of supplements. Per medication review, she has refused 9 out of 14 ProSource as well as 9 of 14 Boost Breeze offered this admission. Pt is receiving CIB daily with breakfast which provides 280 kcal and 13 grams of protein, however unsure if she is drinking this. Pt does not like Nepro supplements. Labs reviewed and normal, noted renal function overall is stable and nephrology has signed off at this time. Will order Ensure daily and continue to monitor labs. Will discontinue ProSource, decrease  Boost Breeze to once daily as well as add Magic Cup to lunch and dinner trays to help her meet her needs.   Medications reviewed and include: Diflucan, Mag-ox, Methadone, Zofran, Prednisone, Prograf  Labs: Na 132 (L), K 5.0 (WNL), BUN 54 (H), Cr 2.97 (H)  Diet Order:   Diet Order            Diet regular Room service appropriate? Yes; Fluid consistency: Thin  Diet effective now                 EDUCATION NEEDS:   Not appropriate for education at this time  Skin:  Skin Assessment: Skin Integrity Issues: Skin Integrity Issues:: Other (Comment) Stage II: buttocks Other: wound of her right inner thigh/groin secondary to her vulvar cancer  Last BM:  1/27  Height:   Ht Readings from Last 1 Encounters:  04/24/20 5\' 5"  (1.651 m)    Weight:   Wt Readings from Last 1 Encounters:  05/01/20 47.4 kg    Ideal Body Weight:  56.8 kg  BMI:  Body mass index is 17.39 kg/m.  Estimated Nutritional Needs:   Kcal:  1750-2000  Protein:  85-100 grams  Fluid:  1.2 L/day   Lajuan Lines, RD, LDN Clinical Nutrition After Hours/Weekend Pager # in [place]

## 2020-05-01 NOTE — Progress Notes (Signed)
Id chart check note   55 yo female with complicated history include recurrent vulva SCC most recent chemo 01/2020 and radiation tx 11/2019-01/2020, renal transplant x2 (recently 2014) on tacro/pred (previously tacro/pred/mmf -- mmf hold with recent vulva infection), ckd 4 (baseline cr 2 through 2021, but worsened recently), femoral dvt's on anticoagulation, htn/hlp, recent admission OSH for right chronic vulva abscess/infection (no I&D) s/p 3 weeks abx levo/metronidazole (bacteroides/eggerthella bsi at that time), readmitted 1/18 to [place] for weakness/failure to thrive with ?seizure like activities. Further w/u show ongoing open right vulva wound with sinus tract/multilocular fluid collection/pelvis osseous involvement concerning for ongoing infection vs malignant process vs both  Ortho had evaluated and agreed I&D or surgical involvement would add management/diagnostic values. IR attempted drainage of abscess 1/28; cx pending  She is currently not on any antibiotics. Hemodynamics stable here/hypertensive. No leukocytosis. Stable mild thrombocytopenia  Her other id issue includes suspected esophageal candidiasis  Vitals/labs reviewed  Micro: 1/28 aspirate cx in progress 1/25 bcx negative  Pathology: 1/22 egd (for anemia)  A. STOMACH, ULCER, BIOPSY:  - Reactive gastropathy with focal ulceration  - No H. pylori or intestinal metaplasia identified  - See comment   COMMENT:   Warthin-Starry stain is NEGATIVE for organisms morphologically  consistent with Helicobacter pylori.  Imaging: Mri pelvis 1. Large area of ulceration with sinus tract seen within the right labial fold with diffuse surrounding phlegmon and extensive myositis in the bilateral adductor and anterior muscular compartments of both thighs. 2. There is a multilocular fluid collection extending in the inferior left pelvic soft tissues surrounding the pubic rami and extending to the anterior upper left thigh,  consistent with a multilocular abscess. 3. Nondisplaced fracture of the inferior right pubic rami with findings suggestive osteomyelitis involving the pubic rami and anterior pubic symphysis. 4. Findings suggestive of osteomyelitis involving the anterior left pubic symphysis.    Plan: -agree I&D for source control given complexity of abscess appearance, bone involvement, prior abx exposure risk for MDRO, abx intolerance in the past (?thrombocytopenia), and need to r/o active malignant process as well -agree she would benefit from a tertiary center ([facility]) where she had transplant previously   -if no plan for transfer, would wait for culture result and restart abx. I think iv abx would be best given her nausea/vomiting with oral previously

## 2020-05-01 NOTE — Progress Notes (Signed)
HD#11 Subjective:   No acute events overnight.   Patient seen and evaluated at bedside this AM. She states that she is doing okay this morning. She did well with her procedure yesterday. Today she is a little sore on the L side, but endorses that this is due to using the restroom and wiping herself. She asked to be readjusted in the bed to be more comfortable. The bedside nurse and MD team helped readjust, and the patient was more comfortable. All questions and concerns addressed at bedside.   Objective:  Vital signs in last 24 hours: Vitals:   04/30/20 2000 04/30/20 2030 04/30/20 2300 05/01/20 0511  BP: (!) 133/97 (!) 132/93 (!) 140/95 (!) 140/101  Pulse: 97 95 98 (!) 104  Resp: 18 17 18 18   Temp: 98.4 F (36.9 C) 98.3 F (36.8 C) 98.8 F (37.1 C) 98.5 F (36.9 C)  TempSrc: Oral Oral Oral Oral  SpO2: 100% 100% 99% 99%  Weight:    47.4 kg  Height:       Supplemental O2: Room Air SpO2: 99 %  Physical Exam Constitutional:      General: She is not in acute distress.    Appearance: She is not toxic-appearing.  HENT:     Head: Normocephalic and atraumatic.  Cardiovascular:     Rate and Rhythm: Normal rate and regular rhythm.     Pulses: Normal pulses.     Heart sounds: Normal heart sounds. No murmur heard. No friction rub. No gallop.   Pulmonary:     Effort: Pulmonary effort is normal.     Breath sounds: Normal breath sounds. No wheezing, rhonchi or rales.  Abdominal:     General: Abdomen is flat. Bowel sounds are normal.     Tenderness: There is no abdominal tenderness.  Neurological:     Mental Status: She is alert and oriented to person, place, and time.     Filed Weights   04/29/20 0627 04/30/20 0558 05/01/20 0511  Weight: 48.8 kg 47.5 kg 47.4 kg     Intake/Output Summary (Last 24 hours) at 05/01/2020 0636 Last data filed at 05/01/2020 0513 Gross per 24 hour  Intake 242 ml  Output --  Net 242 ml   Net IO Since Admission: 3,656.72 mL [05/01/20  0636]  Pertinent Labs: CBC Latest Ref Rng & Units 05/01/2020 04/30/2020 04/29/2020  WBC 4.0 - 10.5 K/uL 8.2 8.9 8.2  Hemoglobin 12.0 - 15.0 g/dL 7.4(L) 8.0(L) 8.0(L)  Hematocrit 36.0 - 46.0 % 24.3(L) 26.6(L) 25.1(L)  Platelets 150 - 400 K/uL 113(L) 108(L) 108(L)    CMP Latest Ref Rng & Units 05/01/2020 04/30/2020 04/29/2020  Glucose 70 - 99 mg/dL 80 120(H) 76  BUN 6 - 20 mg/dL 54(H) 52(H) 53(H)  Creatinine 0.44 - 1.00 mg/dL 2.97(H) 2.78(H) 2.86(H)  Sodium 135 - 145 mmol/L 132(L) 132(L) 133(L)  Potassium 3.5 - 5.1 mmol/L 5.0 4.6 4.4  Chloride 98 - 111 mmol/L 103 101 103  CO2 22 - 32 mmol/L 18(L) 18(L) 21(L)  Calcium 8.9 - 10.3 mg/dL 8.3(L) 8.5(L) 8.4(L)  Total Protein 6.5 - 8.1 g/dL - - -  Total Bilirubin 0.3 - 1.2 mg/dL - - -  Alkaline Phos 38 - 126 U/L - - -  AST 15 - 41 U/L - - -  ALT 0 - 44 U/L - - -    Imaging: MRI Pelvis W/O Contrast 1. Large area of ulceration with sinus tract seen within the right labial fold with diffuse surrounding phlegmon  and extensive myositis in the bilateral adductor and anterior muscular compartments of both thighs. 2. There is a multilocular fluid collection extending in the inferior left pelvic soft tissues surrounding the pubic rami and extending to the anterior upper left thigh, consistent with a multilocular abscess. 3. Nondisplaced fracture of the inferior right pubic rami with findings suggestive osteomyelitis involving the pubic rami and anterior pubic symphysis. 4. Findings suggestive of osteomyelitis involving the anterior left pubic symphysis.  Assessment/Plan:   Active Problems:   Weakness   Pressure ulcer of coccygeal region, stage 2 (HCC)   Thrombocytopenia (HCC)   Kidney transplant status   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([place])   Esophageal candidiasis (HCC)   Normocytic anemia   CKD (chronic kidney disease) stage 4, GFR 15-29 ml/min (HCC)   Wound of right groin   Gastric ulcer without hemorrhage or perforation   Malnutrition of moderate  degree   Acute osteomyelitis of right pelvic region and thigh [facility])  Patient Summary: Christina Mack is a 55 y/o F with a PMHx of Stage IIIC Vulvar Cancer s/p chemoradiation, CKD s/p renal transplant (2001, 2014), HTN, HLD, Femoral Vein DVT on Eliquis presented to the [facility] ED with weakness, n/v, and seizure like activity admitted for further work up.   Right Labial Soft Tissue Ulceration w/ Phlegmon/Myositis Suspected Multilocular Abscess Soft Tissue of Pelvis Nondisplaced fracture of Inferior Right Pubic Rami Suspected Osteomyelitis of Pubic rami, Anterior Pubic Symphysis 04/29/20 - Case discussed with Orthopedics, they do not see themselves having a role in patient's care as they state bony involvement appears chronic.  04/29/20 - Discussed case with Dr. Delsa Sale of Gyn/Onc, her recommendations per her note are consider debridement, consult w/ ID for abx management and to continue wound care with consideration of hyperbaric oxygen.  04/30/20 - Case discussed with Dr. Baxter Flattery of ID. Recommended to discuss case with IR to see if possible to drain abscess and obtain culture to better direct abx therapy.  05/01/20- Patient's IR Drainage on 1/28 showed aspirated fluid of 3 cc were pulled, cultures pending.  -At discharge, refer patient to wound care center with hyperbaric oxygen therapy -hold abx for now, stable and awaiting potential cultures by specialists -BC negative to date -follow wound care recommendations -completed course of levofloxacin and metronidazole on 01/22  Stage 3C Vulvar Cancer Will plan to determine if patient would like to continue her care at [facility] or see outside facility gyn/onc per her preference.  -continue palliative care discussions -continue goals of care discussions.   Generalized Weakness Moderate Malnutrition 2/2 Malignancy/Chronic Illness Multifactorial etiology; deconditioning, malnutrition, recent 2 week hospitalization, severe right labial wound  and hx of cancer. Moderate malnutrition.  -daily BMP -zofran PRN -nutrition recommendation - Boost, Prosource, encourage PO intake -continue to work with PT/OT -CIR consult in progress  Left Upper Leg Pain Patient with left upper thigh pain that has improved. Pubic rami fractures may be contributing. -Ortho consulted, recommendations as per above. -muscle rub PRN -tylenol 100 mg TID scheduled -PT/OT  Shaking Episodes Possibly due to infection from extensive wound. Also checking tacrolimus levels in setting of starting fluconazole therapy. Patient fearful to ambulate in setting of shaking episodes. Consider possible psych component.  -tacrolimus level wnl (6.8) -will continue to monitor. -daily bmp  Pressure ulcer of coccygeal region, stage 2 Secondary to patient's bedrest from her persistent weakness.  -wound care consulted, santyl to wounds, cover with saline gauze, ABD pad. Daily dressing changes.   Esophageal Candidiasis  Continue oral fluconazole -pending  tacrolimus levels   Normocytic Anemia Hx of Iron Deficiency Anemia Hx of Femoral Vein DVT Hgb 8.0. EGD with no clear etiology for blood loss. Suspect secondary to chronic wound, malignancy, and malnutrition. Patient to be switched back to oral anticoagulation today per pharmacy.  -daily CBC -transfuse if under 7  Nausea/Vomiting Patient without emesis past few days. Tolerating diet well.   -PRN zofran -Continue to monitor -Renal diet -QTc normal at this time, if nauseated will add anti-emetic   Kidney Failure s/p kidney transplant 2001 and 2014 Cr stable at 2.86. Nephro document renal function overall stable and to continue current transplant medications. They will sign off at this time.  Hx of Femoral Vein DVT -continue home apixaban  Diet: Renal IVF: None,None VTE: None in setting of anemia Code: Full   Dispo: Anticipated discharge pending CIR placement Maudie Mercury, MD Internal Medicine Resident  PGY-2 Pager [phone] Please contact the on call pager after 5 pm and on weekends at [phone].

## 2020-05-02 LAB — CULTURE, BLOOD (ROUTINE X 2)
Culture: NO GROWTH
Culture: NO GROWTH
Special Requests: ADEQUATE
Special Requests: ADEQUATE

## 2020-05-02 MED ORDER — PROSOURCE PLUS PO LIQD
30.0000 mL | Freq: Two times a day (BID) | ORAL | Status: DC
  Administered 2020-05-04 (×2): 30 mL via ORAL
  Filled 2020-05-02 (×4): qty 30

## 2020-05-02 MED ORDER — BOOST / RESOURCE BREEZE PO LIQD CUSTOM
1.0000 | Freq: Three times a day (TID) | ORAL | Status: DC
  Administered 2020-05-04 (×2): 1 via ORAL

## 2020-05-02 NOTE — Progress Notes (Signed)
Patient refused dressing change

## 2020-05-02 NOTE — Progress Notes (Signed)
HD#12 Subjective:   No acute events overnight.   Patient seen and evaluated at bedside this AM. She states that she is doing okay this morning. Has some back pain that is associated by "tossing and turning in bed" She endorses eating and drinking well. She has no other questions or concerns today. Reemphasized the plan to work towards CIR placement.  Patient would like her care oncological care to be taken over by the [facility].   Objective:  Vital signs in last 24 hours: Vitals:   05/01/20 1850 05/01/20 1854 05/01/20 2354 05/02/20 0500  BP:  (!) 139/95 (!) 137/95 138/84  Pulse:   98 100  Resp:  18 16 16   Temp: 98.7 F (37.1 C)  98.5 F (36.9 C) 98.4 F (36.9 C)  TempSrc: Oral  Oral Oral  SpO2: 100%  100% 100%  Weight:      Height:       Supplemental O2: Room Air SpO2: 100 %   Constitutional: ill appearing, thin, sitting in bed, in no acute distress Cardiovascular: regular rate and rhythm Pulmonary/Chest: normal work of breathing on room air Abdominal:  non-distended Neurological: alert & oriented x 3 Skin: Dry. Echymosis, Purpura on extremities.  Psych: alert and oriented  Filed Weights   04/29/20 0627 04/30/20 0558 05/01/20 0511  Weight: 48.8 kg 47.5 kg 47.4 kg     Intake/Output Summary (Last 24 hours) at 05/02/2020 0629 Last data filed at 05/01/2020 2355 Gross per 24 hour  Intake 240 ml  Output --  Net 240 ml   Net IO Since Admission: 3,896.72 mL [05/02/20 0629]  Pertinent Labs: CBC Latest Ref Rng & Units 05/01/2020 04/30/2020 04/29/2020  WBC 4.0 - 10.5 K/uL 8.2 8.9 8.2  Hemoglobin 12.0 - 15.0 g/dL 7.4(L) 8.0(L) 8.0(L)  Hematocrit 36.0 - 46.0 % 24.3(L) 26.6(L) 25.1(L)  Platelets 150 - 400 K/uL 113(L) 108(L) 108(L)    CMP Latest Ref Rng & Units 05/01/2020 04/30/2020 04/29/2020  Glucose 70 - 99 mg/dL 80 120(H) 76  BUN 6 - 20 mg/dL 54(H) 52(H) 53(H)  Creatinine 0.44 - 1.00 mg/dL 2.97(H) 2.78(H) 2.86(H)  Sodium 135 - 145 mmol/L 132(L) 132(L)  133(L)  Potassium 3.5 - 5.1 mmol/L 5.0 4.6 4.4  Chloride 98 - 111 mmol/L 103 101 103  CO2 22 - 32 mmol/L 18(L) 18(L) 21(L)  Calcium 8.9 - 10.3 mg/dL 8.3(L) 8.5(L) 8.4(L)  Total Protein 6.5 - 8.1 g/dL - - -  Total Bilirubin 0.3 - 1.2 mg/dL - - -  Alkaline Phos 38 - 126 U/L - - -  AST 15 - 41 U/L - - -  ALT 0 - 44 U/L - - -    Imaging: MRI Pelvis W/O Contrast 1. Large area of ulceration with sinus tract seen within the right labial fold with diffuse surrounding phlegmon and extensive myositis in the bilateral adductor and anterior muscular compartments of both thighs. 2. There is a multilocular fluid collection extending in the inferior left pelvic soft tissues surrounding the pubic rami and extending to the anterior upper left thigh, consistent with a multilocular abscess. 3. Nondisplaced fracture of the inferior right pubic rami with findings suggestive osteomyelitis involving the pubic rami and anterior pubic symphysis. 4. Findings suggestive of osteomyelitis involving the anterior left pubic symphysis.  Assessment/Plan:   Active Problems:   Weakness   Pressure ulcer of coccygeal region, stage 2 (HCC)   Thrombocytopenia (HCC)   Kidney transplant status   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([place])   Esophageal candidiasis (  [place])   Normocytic anemia   CKD (chronic kidney disease) stage 4, GFR 15-29 ml/min (HCC)   Wound of right groin   Gastric ulcer without hemorrhage or perforation   Malnutrition of moderate degree   Acute osteomyelitis of right pelvic region and thigh [facility])  Patient Summary: Christina Mack is a 55 y/o F with a PMHx of Stage IIIC Vulvar Cancer s/p chemoradiation, CKD s/p renal transplant (2001, 2014), HTN, HLD, Femoral Vein DVT on Eliquis presented to the [facility] ED with weakness, n/v, and seizure like activity admitted for further work up.   Right Labial Soft Tissue Ulceration w/ Phlegmon/Myositis Suspected Multilocular Abscess Soft Tissue of Pelvis Nondisplaced  fracture of Inferior Right Pubic Rami Suspected Osteomyelitis of Pubic rami, Anterior Pubic Symphysis 04/29/20 - Case discussed with Orthopedics, they do not see themselves having a role in patient's care as they state bony involvement appears chronic.  04/29/20 - Discussed case with Dr. Delsa Sale of Gyn/Onc, her recommendations per her note are consider debridement, consult w/ ID for abx management and to continue wound care with consideration of hyperbaric oxygen.  04/30/20 - Case discussed with Dr. Baxter Flattery of ID. Recommended to discuss case with IR to see if possible to drain abscess and obtain culture to better direct abx therapy.  05/01/20- Patient's IR Drainage on 1/28 showed aspirated fluid of 3 cc were pulled, cultures pending.   Patient denies increase in wound pain. Aerobic/anaerobic cultures from IR procedure negative at 24 hours.  -aerobic/anaerobic cultures negative at 24 hrs -wound care center referral at discharge -[facility] negative to date -follow wound care recommendations -completed course of levofloxacin and metronidazole on 01/22  Stage 3C Vulvar Cancer Patient and daughter endorse wanting to have oby/gyn care here at [facility]. Will send for outpatient referral.  -continue palliative care discussions -continue goals of care discussions.   Generalized Weakness Moderate Malnutrition 2/2 Malignancy/Chronic Illness Multifactorial etiology; deconditioning, malnutrition, recent 2 week hospitalization, severe right labial wound and hx of cancer. Moderate malnutrition.  -daily BMP -zofran PRN -nutrition consulted -continue to work with PT/OT -CIR consult in progress  Left Upper Leg Pain Patient with left upper thigh pain that has improved. Pubic rami fractures may be contributing. -ortho consulted for fracture, no recommendations at this time -muscle rub PRN -tylenol 100 mg TID scheduled -PT/OT  Shaking Episodes Possibly due to infection from extensive wound. Patient fearful  to ambulate in setting of shaking episodes. Consider possible psych component.  -tacrolimus level wnl (6.8) -will continue to monitor. -daily bmp  Pressure ulcer of coccygeal region, stage 2 Secondary to patient's bedrest from her persistent weakness.  -wound care consulted, santyl to wounds, cover with saline gauze, ABD pad. Daily dressing changes.   Esophageal Candidiasis  Continue oral fluconazole, 14 day total course. Start date 1/22 - tacrolimus levels normal  Normocytic Anemia Hx of Iron Deficiency Anemia Hx of Femoral Vein DVT Hgb 8.0. EGD with no clear etiology for blood loss. Suspect secondary to chronic wound, malignancy, and malnutrition. Patient to be switched back to oral anticoagulation today per pharmacy.  -daily CBC -transfuse if under 7  Nausea/Vomiting Patient without emesis past few days. Tolerating diet well.   -PRN zofran -Continue to monitor -Renal diet -QTc normal at this time, if nauseated will add anti-emetic   Kidney Failure s/p kidney transplant 2001 and 2014 Cr stable at 2.86. Nephro document renal function overall stable and to continue current transplant medications. They will sign off at this time.  Hx of Femoral Vein  DVT -continue home apixaban  Diet: Renal IVF: None,None VTE: apixaban Code: Full   Dispo: Anticipated discharge pending CIR placement [place] Internal Medicine Resident PGY-1 Pager [phone] Please contact the on call pager after 5 pm and on weekends at [phone].

## 2020-05-02 NOTE — Progress Notes (Signed)
Orthopedic Tech Progress Note Patient Details:  Christina Mack Jun 01, 1965 [id number]      Post Interventions Patient Tolerated: Well Instructions Provided: Care of device,Poper ambulation with device   Deardra Hinkley 05/02/2020, 12:53 PM

## 2020-05-02 NOTE — Progress Notes (Signed)
Nutrition Brief Note  RD paged on weekend/on-call pager per RN.  Patient reports she is lactose intolerant and cannot drink carnation instant breakfast with milk, take magic cups, and she is worried about Ensure product as well. Attempted to contact patient but she was unavailable at the time. RN made aware that Ensure Enlive is suitable for lactose intolerance. Per previous RD notes, patient was on [employer] and ProSource.   Change supplements:    Add back 30 ml ProSource Plus BID, each supplement provides 100 kcals and 15 grams protein.   Increase Boost Breeze po TID, each supplement provides 250 kcal and 9 grams of protein  Add double protein portions with meals  If able, will address other options such as snacks next week.   Mariana Single RD, LDN Clinical Nutrition Pager listed in [place]

## 2020-05-03 DIAGNOSIS — Z940 Kidney transplant status: Secondary | ICD-10-CM | POA: Diagnosis not present

## 2020-05-03 DIAGNOSIS — N764 Abscess of vulva: Secondary | ICD-10-CM

## 2020-05-03 DIAGNOSIS — T360X5A Adverse effect of penicillins, initial encounter: Secondary | ICD-10-CM

## 2020-05-03 DIAGNOSIS — B3781 Candidal esophagitis: Secondary | ICD-10-CM | POA: Diagnosis not present

## 2020-05-03 DIAGNOSIS — M86151 Other acute osteomyelitis, right femur: Secondary | ICD-10-CM | POA: Diagnosis not present

## 2020-05-03 DIAGNOSIS — D6959 Other secondary thrombocytopenia: Secondary | ICD-10-CM

## 2020-05-03 DIAGNOSIS — L0291 Cutaneous abscess, unspecified: Secondary | ICD-10-CM

## 2020-05-03 LAB — CBC
HCT: 24.4 % — ABNORMAL LOW (ref 36.0–46.0)
Hemoglobin: 7.5 g/dL — ABNORMAL LOW (ref 12.0–15.0)
MCH: 29.2 pg (ref 26.0–34.0)
MCHC: 30.7 g/dL (ref 30.0–36.0)
MCV: 94.9 fL (ref 80.0–100.0)
Platelets: 121 10*3/uL — ABNORMAL LOW (ref 150–400)
RBC: 2.57 MIL/uL — ABNORMAL LOW (ref 3.87–5.11)
RDW: 16.7 % — ABNORMAL HIGH (ref 11.5–15.5)
WBC: 7.7 10*3/uL (ref 4.0–10.5)
nRBC: 0 % (ref 0.0–0.2)

## 2020-05-03 LAB — BASIC METABOLIC PANEL
Anion gap: 13 (ref 5–15)
BUN: 51 mg/dL — ABNORMAL HIGH (ref 6–20)
CO2: 17 mmol/L — ABNORMAL LOW (ref 22–32)
Calcium: 8.3 mg/dL — ABNORMAL LOW (ref 8.9–10.3)
Chloride: 102 mmol/L (ref 98–111)
Creatinine, Ser: 2.62 mg/dL — ABNORMAL HIGH (ref 0.44–1.00)
GFR, Estimated: 21 mL/min — ABNORMAL LOW (ref >60)
Glucose, Bld: 77 mg/dL (ref 70–99)
Potassium: 5.6 mmol/L — ABNORMAL HIGH (ref 3.5–5.1)
Sodium: 132 mmol/L — ABNORMAL LOW (ref 135–145)

## 2020-05-03 MED ORDER — SODIUM BICARBONATE 650 MG PO TABS
1300.0000 mg | ORAL_TABLET | Freq: Two times a day (BID) | ORAL | Status: DC
  Administered 2020-05-03 – 2020-05-05 (×5): 1300 mg via ORAL
  Filled 2020-05-03 (×5): qty 2

## 2020-05-03 NOTE — Progress Notes (Addendum)
HD#13 Subjective:   No acute events overnight.   During evaluation at bedside this morning, patient states she is "doing fine." Reports trapeze overhead frame has helped her for repositioning in bed. States she is ready to get out of the hospital. Discussed with her we are awaiting CIR placement and continuing to wait on her right labial wound cultures to help [place] determine abx therapy.   Objective:  Vital signs in last 24 hours: Vitals:   05/02/20 1255 05/02/20 1817 05/02/20 2317 05/03/20 0449  BP: (!) 136/101 (!) 136/99 (!) 138/98 (!) 142/98  Pulse: 94 99 92 91  Resp: 18 19 16 18   Temp: 98.4 F (36.9 C) 98.7 F (37.1 C) 98.6 F (37 C) 98.3 F (36.8 C)  TempSrc: Oral Oral Oral Oral  SpO2: 100% 100% 100% 100%  Weight:    47.7 kg  Height:       Supplemental O2: Room Air SpO2: 100 %   Constitutional: ill appearing, thin, sitting in bed, in no acute distress Cardiovascular: regular rate and rhythm Pulmonary/Chest: normal work of breathing on room air Abdominal:  non-distended Neurological: alert & oriented x 3 Skin: Dry. Echymosis, Purpura on extremities.  Psych: alert and oriented  Filed Weights   04/30/20 0558 05/01/20 0511 05/03/20 0449  Weight: 47.5 kg 47.4 kg 47.7 kg     Intake/Output Summary (Last 24 hours) at 05/03/2020 0727 Last data filed at 05/03/2020 0454 Gross per 24 hour  Intake 480 ml  Output --  Net 480 ml   Net IO Since Admission: 4,376.72 mL [05/03/20 0727]  Pertinent Labs: CBC Latest Ref Rng & Units 05/03/2020 05/01/2020 04/30/2020  WBC 4.0 - 10.5 K/uL 7.7 8.2 8.9  Hemoglobin 12.0 - 15.0 g/dL 7.5(L) 7.4(L) 8.0(L)  Hematocrit 36.0 - 46.0 % 24.4(L) 24.3(L) 26.6(L)  Platelets 150 - 400 K/uL 121(L) 113(L) 108(L)    CMP Latest Ref Rng & Units 05/03/2020 05/01/2020 04/30/2020  Glucose 70 - 99 mg/dL 77 80 120(H)  BUN 6 - 20 mg/dL 51(H) 54(H) 52(H)  Creatinine 0.44 - 1.00 mg/dL 2.62(H) 2.97(H) 2.78(H)  Sodium 135 - 145 mmol/L 132(L) 132(L) 132(L)   Potassium 3.5 - 5.1 mmol/L 5.6(H) 5.0 4.6  Chloride 98 - 111 mmol/L 102 103 101  CO2 22 - 32 mmol/L 17(L) 18(L) 18(L)  Calcium 8.9 - 10.3 mg/dL 8.3(L) 8.3(L) 8.5(L)  Total Protein 6.5 - 8.1 g/dL - - -  Total Bilirubin 0.3 - 1.2 mg/dL - - -  Alkaline Phos 38 - 126 U/L - - -  AST 15 - 41 U/L - - -  ALT 0 - 44 U/L - - -    Imaging: MRI Pelvis W/O Contrast 1. Large area of ulceration with sinus tract seen within the right labial fold with diffuse surrounding phlegmon and extensive myositis in the bilateral adductor and anterior muscular compartments of both thighs. 2. There is a multilocular fluid collection extending in the inferior left pelvic soft tissues surrounding the pubic rami and extending to the anterior upper left thigh, consistent with a multilocular abscess. 3. Nondisplaced fracture of the inferior right pubic rami with findings suggestive osteomyelitis involving the pubic rami and anterior pubic symphysis. 4. Findings suggestive of osteomyelitis involving the anterior left pubic symphysis.  Assessment/Plan:   Active Problems:   Weakness   Pressure ulcer of coccygeal region, stage 2 (HCC)   Thrombocytopenia (HCC)   Kidney transplant status   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([place])   Esophageal candidiasis (HCC)   Normocytic anemia  CKD (chronic kidney disease) stage 4, GFR 15-29 ml/min (HCC)   Wound of right groin   Gastric ulcer without hemorrhage or perforation   Malnutrition of moderate degree   Acute osteomyelitis of right pelvic region and thigh [facility])  Patient Summary: Christina Mack is a 55 y/o F with a PMHx of Stage IIIC Vulvar Cancer s/p chemoradiation, CKD s/p renal transplant (2001, 2014), HTN, HLD, 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   Right Labial Soft Tissue Ulceration w/ Phlegmon/Myositis Suspected Multilocular Abscess Soft Tissue of Pelvis Nondisplaced fracture  of Inferior Right Pubic Rami Suspected Osteomyelitis of Pubic rami, Anterior Pubic Symphysis 04/29/20 - Case discussed with Orthopedics, they do not see themselves having a role in patient's care as they state bony involvement appears chronic.  04/29/20 - Discussed case with Dr. Delsa Sale of Gyn/Onc, her recommendations per her note are consider debridement, consult w/ ID for abx management and to continue wound care with consideration of hyperbaric oxygen.  04/30/20 - Case discussed with Dr. Baxter Flattery of ID. Recommended to discuss case with IR to see if possible to drain abscess and obtain culture to better direct abx therapy.  05/01/20- Patient's IR Drainage on 1/28 showed aspirated fluid of 3 cc were pulled, cultures pending.   Patient denies increase in wound pain. Aerobic/anaerobic cultures from IR pending. If cultures show no growth, will discuss with ID if need further surgical intervention to narrow antibiotic regimen.  Discussed with ID today, patient stated that she does not want a PICC line.  -aerobic/anaerobic cultures negative at 24 hrs -wound care center referral at discharge -[facility] negative to date -follow wound care recommendations -completed course of levofloxacin and metronidazole on 01/22  Stage 3C Vulvar Cancer Patient and daughter endorse wanting to have oby/gyn care here at [facility]. Will send for outpatient referral.  -continue palliative care discussions -continue goals of care discussions.   Metabolic Acidosis Hyperkalemia Bicarb of 17 and K of 5.6. Suspect hyperkalemia secondary to metabolic acidosis.  -start bicarb tab 1300 mg BID  Generalized Weakness Moderate Malnutrition 2/2 Malignancy/Chronic Illness Multifactorial etiology; deconditioning, malnutrition, recent 2 week hospitalization, severe right labial wound and hx of cancer. Moderate malnutrition.  -daily BMP -zofran PRN -nutrition consulted -continue to work with PT/OT -CIR consult in progress  Left Upper  Leg Pain Patient with left upper thigh pain that has improved. Pubic rami fractures may be contributing. -ortho consulted for fracture, no recommendations at this time -muscle rub PRN -tylenol 100 mg TID scheduled -PT/OT  Shaking Episodes Possibly due to infection from extensive wound. Patient fearful to ambulate in setting of shaking episodes. Consider possible psych component. Patient denies shaking episodes over past few days. -tacrolimus level wnl (6.8) -will continue to monitor. -daily bmp  Pressure ulcer of coccygeal region, stage 2 Secondary to patient's bedrest from her persistent weakness.  -wound care consulted, santyl to wounds, cover with saline gauze, ABD pad. Daily dressing changes.   Esophageal Candidiasis  Continue oral fluconazole, 14 day total course. Day 10/14. Start date 1/22 - tacrolimus levels normal  Normocytic Anemia Hx of Iron Deficiency Anemia Hx of Femoral Vein DVT Hgb 7.5. EGD with no clear etiology for blood loss. Suspect secondary to chronic wound, malignancy, and malnutrition. Patient to be switched back to oral anticoagulation today per pharmacy.  -daily CBC -transfuse if under 7  Kidney Failure s/p kidney transplant 2001 and 2014 Cr stable at 2.62.  Nephro document renal function overall stable and to continue current transplant medications. They will sign off at this time.  Hx of Femoral Vein DVT -continue home apixaban  Diet: Renal IVF: None,None VTE: apixaban Code: Full   Dispo: Anticipated discharge pending CIR placement  [place] [facility] Resident PGY-1 Pager [phone] Please contact the on call pager after 5 pm and on weekends at [phone].

## 2020-05-03 NOTE — Progress Notes (Signed)
Patient ID: Christina Mack, female   DOB: [birth year], 55 y.o.   MRN: [id number]  Medical records reviewed   This NP visited patient at the bedside as a follow up for palliative medicine needs and emotional support.  Created space and opportunity for patient to explore her thoughts and feelings regarding her current medical situation.  She is calm and soft spoken. Patient continues to verbalizes no concerns, and feels that things are going well.    She is hoping to transition to CIR for ongoing physical therapy.    Discussed with patient the importance of continued conversation with her family and the  medical providers regarding overall plan of care and treatment options,  ensuring decisions are within the context of the patients values and GOCs.  Education offered regarding the hard decisions people are faced with in the light of serious illness.  Education offered on the importance of documentation of HPOA and ACP documents.    Questions and concerns addressed.       Total time spent on the unit was 25 minutes   PMT will continue to support holistically  Greater than 50% of the time was spent in counseling and coordination of care  Wadie Lessen NP  Palliative Medicine Team Team Phone # [phone] Pager [phone]

## 2020-05-03 NOTE — Progress Notes (Addendum)
Inpatient Rehab Admissions Coordinator:   I met with Pt at bedside for ongoing discussion regarding potential CIR admit. I have not yet received authorization from her insurance. Pt. Appears medically ready for discharge to CIR. Per ID, there are no plans for surgical intervention at this time. PT and OT continue to recommend CIR-level therapies. I will continue to follow for potential admit pending insurance authorization.   Laura Staley, MS, CCC-SLP Rehab Admissions Coordinator  [phone] (celll) [phone] (office)  

## 2020-05-03 NOTE — Progress Notes (Addendum)
[name] for Infectious Disease  Date of Admission:  04/20/2020      Total days of antibiotics Levaquin + flagyl through 1/22 (3 weeks)           ASSESSMENT: Christina Mack is a 55 y.o. female on immunosuppression for kidney transplant here with large chronic deep ulceration of the right labia/upper inner thigh fold. This has eroded deeply since the end of her radiation therapy.  Became superinfected recently complicated by bacteremia due to bacteroides and eggerthella species.   Aside from the large ulcerated lesion, on exam she has significant left hip pain that limits her ability to sit comfortably and walking makes it worse - MRI with multilocular fluid collection extending in to the inferior left pelvic soft tissues surrounding the pubic rami and extending up to anterior thigh c/w abscess. Osteo suggested in the anterior left pubic symphysis.   Surgical services have evaluated - no surgical plans from gyn onc, ortho or plastics at this time. I think the large more difficult problem is the osteo and loculated abscess described into the left pelvis. It appears something is growing from IR aspirate.  Discussed possible treatment options with her today and she is quite clear that she will not accept a picc line, even if she has to work through the nausea from flagyl.  Assume this is a polymicrobial infection, so unless something grows that is resistant to flouroquinolones we will likely be stuck with renally dosed PO Levaquin + Flagyl again; clindamycin may be an option she would tolerate better for anaerobic coverage.   She is interested in transferring her gyn onc care locally due to proximity to her family.  Planning stay at inpatient rehab - pending insurance approval.   Thrombocytopenia - Thought to be due to zosyn previously; will add to allergy list today. Platelets up to 122K today, closer to her chronic levels.  Esophageal candidiasis - continue fluconazole 21 days      PLAN: 1. Follow micro data. 2. Further recs for antibiotics pending #1  3. She wants gyn onc care locally (?get her in with Dr. Denman George)  4. May need to consider complex wound clinic / ortho clinic at [facility] setting if we cannot cure this infection with antibiotics alone.   5. Continue fluconazole 21 days     Active Problems:   Weakness   Pressure ulcer of coccygeal region, stage 2 (HCC)   Thrombocytopenia (HCC)   Kidney transplant status   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(HCC)   Esophageal candidiasis (HCC)   Normocytic anemia   CKD (chronic kidney disease) stage 4, GFR 15-29 ml/min (HCC)   Wound of right groin   Gastric ulcer without hemorrhage or perforation   Malnutrition of moderate degree   Acute osteomyelitis of right pelvic region and thigh ([place])   . (feeding supplement) PROSource Plus  30 mL Oral BID BM  . sodium chloride   Intravenous Once  . acetaminophen  1,000 mg Oral TID  . apixaban  2.5 mg Oral BID  . collagenase   Topical Daily  . feeding supplement  1 Container Oral TID BM  . fluconazole  100 mg Oral Daily  . magnesium oxide  400 mg Oral BID  . methadone  2.5 mg Oral Q12H  . ondansetron (ZOFRAN) IV  4 mg Intravenous Once  . pantoprazole  40 mg Oral BID AC  . predniSONE  5 mg Oral Q breakfast  . sodium bicarbonate  1,300 mg  Oral BID  . tacrolimus  0.5 mg Oral BID    SUBJECTIVE: Ate well over weekend with food brought in from her family. During our walk down the hall she tells me that her left hip is really bothering her. No further episodes of involuntary arm / body movements.    Review of Systems: Review of Systems  Constitutional: Negative for chills and fever.  HENT: Negative for tinnitus.   Eyes: Negative for blurred vision and photophobia.  Respiratory: Negative for cough and sputum production.   Cardiovascular: Negative for chest pain.  Gastrointestinal: Negative for diarrhea, nausea and vomiting.  Genitourinary: Negative for dysuria.   Musculoskeletal: Positive for back pain and joint pain.  Skin: Negative for rash.  Neurological: Negative for headaches.    Allergies  Allergen Reactions  . Zosyn [Piperacillin Sod-Tazobactam So] Other (See Comments)    Thrombocytopenia  . Codeine     vomiting  . Hydromorphone Nausea And Vomiting  . Oxycodone-Acetaminophen Itching, Nausea Only, Other (See Comments) and Nausea And Vomiting    Other Reaction: HA    OBJECTIVE: Vitals:   05/02/20 1255 05/02/20 1817 05/02/20 2317 05/03/20 0449  BP: (!) 136/101 (!) 136/99 (!) 138/98 (!) 142/98  Pulse: 94 99 92 91  Resp: 18 19 16 18   Temp: 98.4 F (36.9 C) 98.7 F (37.1 C) 98.6 F (37 C) 98.3 F (36.8 C)  TempSrc: Oral Oral Oral Oral  SpO2: 100% 100% 100% 100%  Weight:    47.7 kg  Height:       Body mass index is 17.5 kg/m.   Physical Exam Constitutional:      Appearance: Normal appearance. She is ill-appearing.     Comments: Thin appearing, resting comfortably in bed.   HENT:     Mouth/Throat:     Mouth: Mucous membranes are moist.     Pharynx: Oropharynx is clear.  Eyes:     General: No scleral icterus. Pulmonary:     Effort: Pulmonary effort is normal.  Musculoskeletal:     Comments: Ambulating down the hall with rolling walker. Limping off left leg at times. With sitting she has to sit on her right hip to avoid increased pain.   Neurological:     Mental Status: She is alert and oriented to person, place, and time.  Psychiatric:        Mood and Affect: Mood normal.        Thought Content: Thought content normal.     Lab Results Lab Results  Component Value Date   WBC 7.7 05/03/2020   HGB 7.5 (L) 05/03/2020   HCT 24.4 (L) 05/03/2020   MCV 94.9 05/03/2020   PLT 121 (L) 05/03/2020    Lab Results  Component Value Date   CREATININE 2.62 (H) 05/03/2020   BUN 51 (H) 05/03/2020   NA 132 (L) 05/03/2020   K 5.6 (H) 05/03/2020   CL 102 05/03/2020   CO2 17 (L) 05/03/2020    Lab Results  Component Value  Date   ALT 8 04/24/2020   AST 14 (L) 04/24/2020   ALKPHOS 102 04/24/2020   BILITOT 0.9 04/24/2020     Microbiology: BCx 1/25 >> no growth, prelim Wound Cx 1/28 >> GS negative, culture re-incubating for better growth    [name], MSN, NP-C [facility] [email or web address] Pager: [phone] Office: [phone] [place]: [phone]

## 2020-05-03 NOTE — Progress Notes (Signed)
Pt dressing change and wound care request time change from 5am til later in am after pain medication methadone.pt states was changed 6pm prior shift, pt does not want to have more pain med either.

## 2020-05-03 NOTE — Progress Notes (Signed)
Physical Therapy Treatment Patient Details Name: Christina Mack MRN: [id number] DOB: 1966/02/28 Today's Date: 05/03/2020    History of Present Illness 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 MRI of pelvis on 04/26/2020 showing large area of ulceration with sinus tract seen within the right labial fold, extensive myositis, multilocular fluid collection extending in the inferior left pelvic soft tissues consistent with a multilocular abscess, nondisplaced fracture of the inferior right pubic rami with findings suggestive of osteomyelitis involving the pubic rami and anterior pubic symphysis, also has findings suggestive of osteomyelitis within the anterior left pubic symphysis. 05/01/19 plan for IR aspiration of area of abscess for culture data to tailor antibiotic therapy    PT Comments    Pt progressing towards physical therapy goals. We were able to progress mobility to hallway ambulation, gaining a total of ~40 feet with RW for support. Chair follow utilized. Continue to recommend CIR follow-up to maximize functional independence and safety prior to return home.    Follow Up Recommendations  Supervision for mobility/OOB;CIR     Equipment Recommendations  Rolling walker with 5" wheels    Recommendations for Other Services Rehab consult     Precautions / Restrictions Precautions Precautions: Fall Precaution Comments: recent falls Restrictions Weight Bearing Restrictions: No    Mobility  Bed Mobility Overal bed mobility: Needs Assistance Bed Mobility: Rolling;Sidelying to Sit Rolling: Supervision Sidelying to sit: Min assist       General bed mobility comments: Log roll toward R EOB with HOB partially elevated. Increased time/effort to bring trunk  upright. Needs BLE assist to return to supine.  Transfers Overall transfer level: Needs assistance Equipment used: Rolling walker (2 wheeled) Transfers: Sit to/from Stand Sit to Stand: Min assist         General transfer comment: from elevated EOB height to RW. Pt with good sequencing/safety awareness but anxious/fearful of falling  Ambulation/Gait Ambulation/Gait assistance: Min guard;+2 safety/equipment Gait Distance (Feet): 40 Feet Assistive device: Rolling walker (2 wheeled) Gait Pattern/deviations: Step-through pattern;Decreased stride length;Trunk flexed;Narrow base of support Gait velocity: decreased Gait velocity interpretation: <1.31 ft/sec, indicative of household ambulator General Gait Details: Slow and guarded with RW for support. Chair follow utilized for safety as pt fatigues quickly. L hip pain and fatigue limiting distance.   Stairs             Wheelchair Mobility    Modified Rankin (Stroke Patients Only)       Balance Overall balance assessment: Needs assistance Sitting-balance support: Feet supported Sitting balance-Leahy Scale: Fair     Standing balance support: Bilateral upper extremity supported Standing balance-Leahy Scale: Poor Standing balance comment: Reliant on UE support of RW                            Cognition Arousal/Alertness: Awake/alert Behavior During Therapy: Flat affect;WFL for tasks assessed/performed Overall Cognitive Status: Within Functional Limits for tasks assessed                                        Exercises      General Comments  Pertinent Vitals/Pain Pain Assessment: Faces Faces Pain Scale: Hurts little more Pain Location: L hip Pain Descriptors / Indicators: Grimacing;Guarding    Home Living                      Prior Function            PT Goals (current goals can now be found in the care plan section) Acute Rehab PT Goals Patient Stated Goal: get  home PT Goal Formulation: With patient Time For Goal Achievement: 05/05/20 Potential to Achieve Goals: Fair Progress towards PT goals: Progressing toward goals    Frequency    Min 3X/week      PT Plan Current plan remains appropriate    Co-evaluation              AM-PAC PT "6 Clicks" Mobility   Outcome Measure  Help needed turning from your back to your side while in a flat bed without using bedrails?: A Little Help needed moving from lying on your back to sitting on the side of a flat bed without using bedrails?: A Little Help needed moving to and from a bed to a chair (including a wheelchair)?: A Little Help needed standing up from a chair using your arms (e.g., wheelchair or bedside chair)?: A Little Help needed to walk in hospital room?: A Little Help needed climbing 3-5 steps with a railing? : Total 6 Click Score: 16    End of Session Equipment Utilized During Treatment: Gait belt Activity Tolerance: Patient tolerated treatment well;Patient limited by fatigue (limited by anxiety) Patient left: in chair;with call bell/phone within reach Nurse Communication: Mobility status PT Visit Diagnosis: Unsteadiness on feet (R26.81)     Time: 1030-1051 PT Time Calculation (min) (ACUTE ONLY): 21 min  Charges:  $Gait Training: 8-22 mins                     Rolinda Roan, PT, DPT Acute Rehabilitation Services Pager: [phone] Office: [phone]    Thelma Comp 05/03/2020, 1:01 PM

## 2020-05-04 DIAGNOSIS — E875 Hyperkalemia: Secondary | ICD-10-CM

## 2020-05-04 DIAGNOSIS — L89152 Pressure ulcer of sacral region, stage 2: Secondary | ICD-10-CM

## 2020-05-04 LAB — CBC
HCT: 21.3 % — ABNORMAL LOW (ref 36.0–46.0)
Hemoglobin: 6.9 g/dL — CL (ref 12.0–15.0)
MCH: 29 pg (ref 26.0–34.0)
MCHC: 32.4 g/dL (ref 30.0–36.0)
MCV: 89.5 fL (ref 80.0–100.0)
Platelets: 132 10*3/uL — ABNORMAL LOW (ref 150–400)
RBC: 2.38 MIL/uL — ABNORMAL LOW (ref 3.87–5.11)
RDW: 16.9 % — ABNORMAL HIGH (ref 11.5–15.5)
WBC: 7.7 10*3/uL (ref 4.0–10.5)
nRBC: 0 % (ref 0.0–0.2)

## 2020-05-04 LAB — URINALYSIS, COMPLETE (UACMP) WITH MICROSCOPIC
Bilirubin Urine: NEGATIVE
Glucose, UA: 100 mg/dL — AB
Ketones, ur: NEGATIVE mg/dL
Nitrite: NEGATIVE
Protein, ur: 30 mg/dL — AB
Specific Gravity, Urine: 1.015 (ref 1.005–1.030)
WBC, UA: >50 WBC/hpf (ref 0–5)
pH: 5.5 (ref 5.0–8.0)

## 2020-05-04 LAB — BASIC METABOLIC PANEL
Anion gap: 12 (ref 5–15)
BUN: 46 mg/dL — ABNORMAL HIGH (ref 6–20)
CO2: 18 mmol/L — ABNORMAL LOW (ref 22–32)
Calcium: 8.4 mg/dL — ABNORMAL LOW (ref 8.9–10.3)
Chloride: 101 mmol/L (ref 98–111)
Creatinine, Ser: 2.53 mg/dL — ABNORMAL HIGH (ref 0.44–1.00)
GFR, Estimated: 22 mL/min — ABNORMAL LOW (ref >60)
Glucose, Bld: 128 mg/dL — ABNORMAL HIGH (ref 70–99)
Potassium: 5.1 mmol/L (ref 3.5–5.1)
Sodium: 131 mmol/L — ABNORMAL LOW (ref 135–145)

## 2020-05-04 LAB — PREPARE RBC (CROSSMATCH)

## 2020-05-04 MED ORDER — OXYMETAZOLINE HCL 0.05 % NA SOLN
1.0000 | Freq: Two times a day (BID) | NASAL | Status: DC
  Filled 2020-05-04: qty 30

## 2020-05-04 MED ORDER — SODIUM CHLORIDE 0.9% IV SOLUTION: Freq: Once | INTRAVENOUS | Status: AC

## 2020-05-04 MED ORDER — FLUCONAZOLE 100 MG PO TABS
100.0000 mg | ORAL_TABLET | Freq: Every day | ORAL | Status: DC
  Administered 2020-05-04 – 2020-05-05 (×2): 100 mg via ORAL
  Filled 2020-05-04 (×2): qty 1

## 2020-05-04 MED ORDER — SILVER NITRATE-POT NITRATE 75-25 % EX MISC
1.0000 | Freq: Once | CUTANEOUS | Status: AC
  Administered 2020-05-04: 1 via TOPICAL
  Filled 2020-05-04: qty 1

## 2020-05-04 MED ORDER — OXYMETAZOLINE HCL 0.05 % NA SOLN
1.0000 | Freq: Two times a day (BID) | NASAL | Status: DC
  Administered 2020-05-04 – 2020-05-05 (×2): 1 via NASAL

## 2020-05-04 NOTE — Progress Notes (Signed)
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   Options are limited for treatment, preferably would use Daptomycin however she is not agreeable to a picc line. Will look into tedezolid cost for 6-week course (linezolid less favorable d/t thrombocytopenia baseline).  Would also continue previous PO regimen of levaquin and flagyl as well given the high likelihood for polymicrobial infection (w/ history of bacteremia from anaerobes). If she lets [place] replace IV inpatient or use midline while here for temporary option while she rehabs we could start with dapto and transition to PO option for shorter duration to further limit side effects.   Will talk with her further tomorrow about options she has and would be willing to do for treatment. Awaiting insurance approval currently for CIR bed. Unclear how long she would require for stay.    [name], MSN, NP-C [facility] for Infectious Disease [email or web address] Pager: [phone] Office: [phone] [place]: [phone]

## 2020-05-04 NOTE — Progress Notes (Signed)
OT Cancellation Note  Patient Details Name: Christina Mack MRN: [id number] DOB: 1965-08-15   Cancelled Treatment:    Reason Eval/Treat Not Completed: Medical issues which prohibited therapy (1st attempt, IR in room. 2nd attempt, pt with low Hgb and getting blood at this time. OT to continue to follow for OT intervention)   Jefferey Pica, OTR/L [place] Pager: [phone] Office: [place] C 05/04/2020, 4:02 PM

## 2020-05-04 NOTE — Progress Notes (Signed)
Spoke with on call MD to inform them that the patient's nose is still bleeding. They have come to bedside to assess patient.

## 2020-05-04 NOTE — Progress Notes (Signed)
Spoke with Dr. Alfonse Spruce to inform him that the patient is having a nose bleed.

## 2020-05-04 NOTE — Progress Notes (Signed)
Called and spoke with on call MD to inform that the patient is still having a nose bleed with large clots. They will come to see the patient.

## 2020-05-04 NOTE — Progress Notes (Signed)
Subjective:   Hospital day: 13  Overnight event: No OV event  Patient is seen at bedside. She appears comfortable with no acute distress.  Patient states that she still have mild pain of her left hip but it is tolerable. Has had drainage that is clear but no blood. Denies fever and chills. IV removed this morning. Patient anxious and tearful that the IV need to be replaced for a transfusion as she is a hard stick. Discussed that we will have IV team help with obtaining access for blood transfusion and possible IV antibiotics  Objective:  Vital signs in last 24 hours: Vitals:   05/03/20 1221 05/03/20 1808 05/03/20 2349 05/04/20 0540  BP: (!) 130/99 (!) 140/98 (!) 138/98 (!) 138/104  Pulse: (!) 104 (!) 101 (!) 102 (!) 105  Resp: 16 16 14 18   Temp: 98.5 F (36.9 C) 98.6 F (37 C) 98.7 F (37.1 C) 98.7 F (37.1 C)  TempSrc: Oral Oral Oral Oral  SpO2: 99% 100% 100% 100%  Weight:    47.8 kg  Height:       CBC Latest Ref Rng & Units 05/04/2020 05/03/2020 05/01/2020  WBC 4.0 - 10.5 K/uL 7.7 7.7 8.2  Hemoglobin 12.0 - 15.0 g/dL 6.9(LL) 7.5(L) 7.4(L)  Hematocrit 36.0 - 46.0 % 21.3(L) 24.4(L) 24.3(L)  Platelets 150 - 400 K/uL 132(L) 121(L) 113(L)   CMP Latest Ref Rng & Units 05/04/2020 05/03/2020 05/01/2020  Glucose 70 - 99 mg/dL 128(H) 77 80  BUN 6 - 20 mg/dL 46(H) 51(H) 54(H)  Creatinine 0.44 - 1.00 mg/dL 2.53(H) 2.62(H) 2.97(H)  Sodium 135 - 145 mmol/L 131(L) 132(L) 132(L)  Potassium 3.5 - 5.1 mmol/L 5.1 5.6(H) 5.0  Chloride 98 - 111 mmol/L 101 102 103  CO2 22 - 32 mmol/L 18(L) 17(L) 18(L)  Calcium 8.9 - 10.3 mg/dL 8.4(L) 8.3(L) 8.3(L)  Total Protein 6.5 - 8.1 g/dL - - -  Total Bilirubin 0.3 - 1.2 mg/dL - - -  Alkaline Phos 38 - 126 U/L - - -  AST 15 - 41 U/L - - -  ALT 0 - 44 U/L - - -    Physical Exam  Physical Exam Constitutional:      General: She is not in acute distress. HENT:     Head: Normocephalic.  Cardiovascular:     Rate and Rhythm: Regular rhythm. Tachycardia  present.  Pulmonary:     Effort: No respiratory distress.  Abdominal:     General: Bowel sounds are normal.  Musculoskeletal:     Cervical back: Normal range of motion.     Right lower leg: No edema.     Left lower leg: No edema.  Skin:    General: Skin is warm.  Neurological:     Mental Status: She is alert.  Psychiatric:        Mood and Affect: Mood normal.    Assessment/Plan: Mrs. 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 Currently managing right labial wound.  Pending CIR placement.  Active Problems:   Weakness   Pressure ulcer of coccygeal region, stage 2 (HCC)   Thrombocytopenia (HCC)   Kidney transplant status   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(HCC)   Esophageal candidiasis (HCC)   Normocytic anemia   CKD (chronic kidney disease) stage 4, GFR 15-29 ml/min (HCC)  Wound of right groin   Gastric ulcer without hemorrhage or perforation   Malnutrition of moderate degree   Acute osteomyelitis of right pelvic region and thigh (HCC)   Abscess  Right Labial Soft Tissue Ulceration w/ Phlegmon/Myositis Suspected Multilocular Abscess Soft Tissue of Pelvis Nondisplaced fracture of Inferior Right Pubic Rami Suspected Osteomyelitis of Pubic rami, Anterior Pubic Symphysis Orthopedics, plastic surgery and gynecology oncology were consulted, no surgical intervention recommended at this time.  Her abscess was drained and culture showed rare Enterococcus facecium.  Blood culture negative to date.  Appreciate infectious disease recommendations. -wound care center referral at discharge -follow wound care recommendations -GYN/Onc follow-up at discharge -Pending CIR placement   Stage 3C Vulvar Cancer Patient and daughter endorse wanting to have oby/gyn care here at [facility]. Will  send for outpatient referral after discharge.   Normocytic Anemia Hx of Iron Deficiency Anemia EGD with no clear etiology for blood loss. Suspect secondary to chronic wound.  Hemoglobin trending down to 6.9 today.  Will transfuse 1 unit and check posttransfusion H&H. -Transfuse 1 unit. Check post-tran H&H -daily CBC -transfuse if under 7   Non-anion gap metabolic Acidosis Hyperkalemia-improved Hyponatremia-likely due to malnutrition Chronic kidney disease s/p renal transplant Her non-anion gap metabolic acidosis is likely related to her CKD.  Continue sodium bicarb and monitor BMP daily -bicarb tab 1300 mg BID -Continue tacrolimus at a reduced dose   Pressure ulcer of coccygeal region, stage 2 Secondary to patient's bedrest from her persistent weakness.  -wound care consulted, santyl to wounds, cover with saline gauze, ABD pad. Daily dressing changes.    Esophageal Candidiasis  Continue oral fluconazole, 21 day total course (day 10/21)   Hx of Femoral Vein DVT -continue home apixaban  Diet: Regular IVF: N/A VTE: Eliquis CODE: Full  Prior to Admission Living Arrangement: Home Anticipated Discharge Location: CIR Barriers to Discharge: Antibiotic therapy and CIR placement  Gaylan Gerold, DO 05/04/2020, 7:03 AM Pager: [phone] After 5pm on weekdays and 1pm on weekends: On Call pager [phone]

## 2020-05-04 NOTE — Progress Notes (Signed)
AFTERNOON ROUNDING NOTE:  Paged by RN regarding epistaxis. Patient evaluated at bedside. She is resting comfortably in bed with paper towel in nose and multiple used papertowels in the trash can next to her with bright red blood on them. She endorses that her epistaxis started approximately 30-45 minutes ago and has persisted despite holding pressures. On examination, continues to have active bleeding with removal of pressure. She is finishing up her blood transfusion.  Most recent vitals: Temp 98.4, BP 147/98, HR 87, RR 18 with SpO2 100% on RA Patient endorses that she has had similar bleeds while on Eliquis in the past and suspects that this is secondary to her Eliquis. However, she is only on 2.5mg  bid which is relatively low dose to cause any significant bleeding. She does have thrombocytopenia on labs which has been chronic and actually improving over the past few days. Her uremia could be inducing further platelet dysfunction as well although her BUN does seem to be improving over the past several days. Given that her epistaxis started mostly following the blood transfusion, transfusion-associated reaction is also on the differential given the abnormal bleeding, although this would be unlikely given lack of fever/chills, back pain, respiratory distress or hypotension.  Plan: Afrin nose spray twice daily for epistaxis Repeat CBC Urinalysis to evaluate for hematuria Consider [place]'s DAT

## 2020-05-05 ENCOUNTER — Inpatient Hospital Stay
Admit: 2020-05-05 | Discharge: 2020-05-05 | Disposition: A | Discharge status: Home / Self Care | Payer: Self-pay | Attending: Gynecologic Oncology | Admitting: Gynecologic Oncology

## 2020-05-05 ENCOUNTER — Other Ambulatory Visit: Payer: Self-pay

## 2020-05-05 ENCOUNTER — Inpatient Hospital Stay (HOSPITAL_COMMUNITY)
Admission: RE | Admit: 2020-05-05 | Discharge: 2020-05-17 | DRG: 945 | Disposition: A | Discharge status: Home Health | Source: Intra-hospital | Attending: Physical Medicine and Rehabilitation | Admitting: Physical Medicine and Rehabilitation

## 2020-05-05 ENCOUNTER — Encounter (HOSPITAL_COMMUNITY): Payer: Self-pay | Admitting: Physical Medicine and Rehabilitation

## 2020-05-05 ENCOUNTER — Encounter (HOSPITAL_COMMUNITY): Payer: Self-pay | Admitting: Internal Medicine

## 2020-05-05 DIAGNOSIS — C519 Malignant neoplasm of vulva, unspecified: Secondary | ICD-10-CM

## 2020-05-05 DIAGNOSIS — Z7952 Long term (current) use of systemic steroids: Secondary | ICD-10-CM | POA: Diagnosis not present

## 2020-05-05 DIAGNOSIS — Z808 Family history of malignant neoplasm of other organs or systems: Secondary | ICD-10-CM | POA: Diagnosis not present

## 2020-05-05 DIAGNOSIS — Z940 Kidney transplant status: Secondary | ICD-10-CM

## 2020-05-05 DIAGNOSIS — Z79899 Other long term (current) drug therapy: Secondary | ICD-10-CM | POA: Diagnosis not present

## 2020-05-05 DIAGNOSIS — I129 Hypertensive chronic kidney disease with stage 1 through stage 4 chronic kidney disease, or unspecified chronic kidney disease: Secondary | ICD-10-CM | POA: Diagnosis present

## 2020-05-05 DIAGNOSIS — Z923 Personal history of irradiation: Secondary | ICD-10-CM | POA: Diagnosis not present

## 2020-05-05 DIAGNOSIS — D696 Thrombocytopenia, unspecified: Secondary | ICD-10-CM | POA: Diagnosis not present

## 2020-05-05 DIAGNOSIS — Z681 Body mass index (BMI) 19 or less, adult: Secondary | ICD-10-CM | POA: Diagnosis not present

## 2020-05-05 DIAGNOSIS — D6489 Other specified anemias: Secondary | ICD-10-CM | POA: Diagnosis present

## 2020-05-05 DIAGNOSIS — M8618 Other acute osteomyelitis, other site: Secondary | ICD-10-CM | POA: Diagnosis present

## 2020-05-05 DIAGNOSIS — T373X5A Adverse effect of other antiprotozoal drugs, initial encounter: Secondary | ICD-10-CM | POA: Diagnosis not present

## 2020-05-05 DIAGNOSIS — G8929 Other chronic pain: Secondary | ICD-10-CM | POA: Diagnosis present

## 2020-05-05 DIAGNOSIS — Z1621 Resistance to vancomycin: Secondary | ICD-10-CM | POA: Diagnosis present

## 2020-05-05 DIAGNOSIS — D6959 Other secondary thrombocytopenia: Secondary | ICD-10-CM | POA: Diagnosis present

## 2020-05-05 DIAGNOSIS — R5381 Other malaise: Secondary | ICD-10-CM

## 2020-05-05 DIAGNOSIS — E43 Unspecified severe protein-calorie malnutrition: Secondary | ICD-10-CM | POA: Diagnosis present

## 2020-05-05 DIAGNOSIS — D649 Anemia, unspecified: Secondary | ICD-10-CM | POA: Diagnosis not present

## 2020-05-05 DIAGNOSIS — L0291 Cutaneous abscess, unspecified: Secondary | ICD-10-CM | POA: Diagnosis present

## 2020-05-05 DIAGNOSIS — Z7901 Long term (current) use of anticoagulants: Secondary | ICD-10-CM | POA: Diagnosis not present

## 2020-05-05 DIAGNOSIS — L89302 Pressure ulcer of unspecified buttock, stage 2: Secondary | ICD-10-CM | POA: Diagnosis present

## 2020-05-05 DIAGNOSIS — Z86718 Personal history of other venous thrombosis and embolism: Secondary | ICD-10-CM | POA: Diagnosis not present

## 2020-05-05 DIAGNOSIS — L89152 Pressure ulcer of sacral region, stage 2: Secondary | ICD-10-CM | POA: Diagnosis present

## 2020-05-05 DIAGNOSIS — R296 Repeated falls: Secondary | ICD-10-CM | POA: Diagnosis present

## 2020-05-05 DIAGNOSIS — Z9221 Personal history of antineoplastic chemotherapy: Secondary | ICD-10-CM

## 2020-05-05 DIAGNOSIS — R040 Epistaxis: Secondary | ICD-10-CM | POA: Diagnosis not present

## 2020-05-05 DIAGNOSIS — B3781 Candidal esophagitis: Secondary | ICD-10-CM | POA: Diagnosis present

## 2020-05-05 DIAGNOSIS — N184 Chronic kidney disease, stage 4 (severe): Secondary | ICD-10-CM | POA: Diagnosis present

## 2020-05-05 DIAGNOSIS — M86152 Other acute osteomyelitis, left femur: Secondary | ICD-10-CM | POA: Diagnosis present

## 2020-05-05 DIAGNOSIS — G894 Chronic pain syndrome: Secondary | ICD-10-CM | POA: Diagnosis present

## 2020-05-05 DIAGNOSIS — L8990 Pressure ulcer of unspecified site, unspecified stage: Secondary | ICD-10-CM | POA: Insufficient documentation

## 2020-05-05 DIAGNOSIS — R64 Cachexia: Secondary | ICD-10-CM | POA: Diagnosis present

## 2020-05-05 DIAGNOSIS — D84821 Immunodeficiency due to drugs: Secondary | ICD-10-CM | POA: Diagnosis present

## 2020-05-05 DIAGNOSIS — F419 Anxiety disorder, unspecified: Secondary | ICD-10-CM | POA: Diagnosis present

## 2020-05-05 DIAGNOSIS — S31109A Unspecified open wound of abdominal wall, unspecified quadrant without penetration into peritoneal cavity, initial encounter: Secondary | ICD-10-CM | POA: Diagnosis present

## 2020-05-05 DIAGNOSIS — G4700 Insomnia, unspecified: Secondary | ICD-10-CM | POA: Diagnosis not present

## 2020-05-05 DIAGNOSIS — G893 Neoplasm related pain (acute) (chronic): Secondary | ICD-10-CM | POA: Diagnosis not present

## 2020-05-05 DIAGNOSIS — Z20822 Contact with and (suspected) exposure to covid-19: Secondary | ICD-10-CM | POA: Diagnosis present

## 2020-05-05 DIAGNOSIS — M25551 Pain in right hip: Secondary | ICD-10-CM | POA: Diagnosis present

## 2020-05-05 DIAGNOSIS — E871 Hypo-osmolality and hyponatremia: Secondary | ICD-10-CM | POA: Diagnosis present

## 2020-05-05 DIAGNOSIS — Z515 Encounter for palliative care: Secondary | ICD-10-CM | POA: Diagnosis not present

## 2020-05-05 DIAGNOSIS — R112 Nausea with vomiting, unspecified: Secondary | ICD-10-CM | POA: Diagnosis not present

## 2020-05-05 DIAGNOSIS — M86151 Other acute osteomyelitis, right femur: Secondary | ICD-10-CM | POA: Diagnosis present

## 2020-05-05 LAB — BASIC METABOLIC PANEL
Anion gap: 14 (ref 5–15)
BUN: 43 mg/dL — ABNORMAL HIGH (ref 6–20)
CO2: 18 mmol/L — ABNORMAL LOW (ref 22–32)
Calcium: 8.5 mg/dL — ABNORMAL LOW (ref 8.9–10.3)
Chloride: 98 mmol/L (ref 98–111)
Creatinine, Ser: 2.23 mg/dL — ABNORMAL HIGH (ref 0.44–1.00)
GFR, Estimated: 26 mL/min — ABNORMAL LOW (ref >60)
Glucose, Bld: 105 mg/dL — ABNORMAL HIGH (ref 70–99)
Potassium: 4.5 mmol/L (ref 3.5–5.1)
Sodium: 130 mmol/L — ABNORMAL LOW (ref 135–145)

## 2020-05-05 LAB — TYPE AND SCREEN
ABO/RH(D): O POS
Antibody Screen: NEGATIVE
Unit division: 0

## 2020-05-05 LAB — CBC
HCT: 26.9 % — ABNORMAL LOW (ref 36.0–46.0)
Hemoglobin: 8.4 g/dL — ABNORMAL LOW (ref 12.0–15.0)
MCH: 28.2 pg (ref 26.0–34.0)
MCHC: 31.2 g/dL (ref 30.0–36.0)
MCV: 90.3 fL (ref 80.0–100.0)
Platelets: 120 10*3/uL — ABNORMAL LOW (ref 150–400)
RBC: 2.98 MIL/uL — ABNORMAL LOW (ref 3.87–5.11)
RDW: 16.4 % — ABNORMAL HIGH (ref 11.5–15.5)
WBC: 8.1 10*3/uL (ref 4.0–10.5)
nRBC: 0 % (ref 0.0–0.2)

## 2020-05-05 LAB — HEMOGLOBIN AND HEMATOCRIT, BLOOD
HCT: 26.6 % — ABNORMAL LOW (ref 36.0–46.0)
Hemoglobin: 8.3 g/dL — ABNORMAL LOW (ref 12.0–15.0)

## 2020-05-05 LAB — SEDIMENTATION RATE: Sed Rate: 70 mm/hr — ABNORMAL HIGH (ref 0–22)

## 2020-05-05 LAB — BPAM RBC
Blood Product Expiration Date: 202203032359
ISSUE DATE / TIME: 202202011502
Unit Type and Rh: 5100

## 2020-05-05 MED ORDER — PREDNISONE 10 MG PO TABS
5.0000 mg | ORAL_TABLET | Freq: Every day | ORAL | Status: DC
  Administered 2020-05-06 – 2020-05-17 (×12): 5 mg via ORAL
  Filled 2020-05-05 (×12): qty 1

## 2020-05-05 MED ORDER — TRAZODONE HCL 50 MG PO TABS: 25.0000 mg | ORAL_TABLET | Freq: Every evening | ORAL | Status: DC | PRN

## 2020-05-05 MED ORDER — METHADONE HCL 5 MG PO TABS
2.5000 mg | ORAL_TABLET | Freq: Two times a day (BID) | ORAL | Status: DC
  Administered 2020-05-05 – 2020-05-17 (×24): 2.5 mg via ORAL
  Filled 2020-05-05 (×24): qty 1

## 2020-05-05 MED ORDER — PROCHLORPERAZINE EDISYLATE 10 MG/2ML IJ SOLN: 5.0000 mg | Freq: Four times a day (QID) | INTRAMUSCULAR | Status: DC | PRN

## 2020-05-05 MED ORDER — OXYMETAZOLINE HCL 0.05 % NA SOLN
1.0000 | Freq: Two times a day (BID) | NASAL | Status: AC
  Administered 2020-05-05 – 2020-05-06 (×2): 1 via NASAL
  Filled 2020-05-05: qty 30

## 2020-05-05 MED ORDER — FLEET ENEMA 7-19 GM/118ML RE ENEM: 1.0000 | ENEMA | Freq: Once | RECTAL | Status: DC | PRN

## 2020-05-05 MED ORDER — ONDANSETRON HCL 4 MG/2ML IJ SOLN: 4.0000 mg | Freq: Four times a day (QID) | INTRAMUSCULAR | 0 refills | Status: DC | PRN

## 2020-05-05 MED ORDER — APIXABAN 2.5 MG PO TABS
2.5000 mg | ORAL_TABLET | Freq: Every day | ORAL | Status: DC
  Administered 2020-05-06 – 2020-05-17 (×12): 2.5 mg via ORAL
  Filled 2020-05-05 (×13): qty 1

## 2020-05-05 MED ORDER — SODIUM CHLORIDE 0.9 % IV SOLN: 500.0000 mg | INTRAVENOUS | Status: DC

## 2020-05-05 MED ORDER — OXYMETAZOLINE HCL 0.05 % NA SOLN: 1.0000 | Freq: Two times a day (BID) | NASAL | 0 refills | Status: DC

## 2020-05-05 MED ORDER — METRONIDAZOLE 500 MG PO TABS
500.0000 mg | ORAL_TABLET | Freq: Three times a day (TID) | ORAL | Status: DC
  Administered 2020-05-05: 500 mg via ORAL
  Filled 2020-05-05: qty 1

## 2020-05-05 MED ORDER — TACROLIMUS 0.5 MG PO CAPS: 0.5000 mg | ORAL_CAPSULE | Freq: Two times a day (BID) | ORAL | Status: DC

## 2020-05-05 MED ORDER — ACETAMINOPHEN 500 MG PO TABS
1000.0000 mg | ORAL_TABLET | Freq: Three times a day (TID) | ORAL | Status: DC
  Administered 2020-05-05 – 2020-05-17 (×34): 1000 mg via ORAL
  Filled 2020-05-05 (×34): qty 2

## 2020-05-05 MED ORDER — METRONIDAZOLE 500 MG PO TABS
500.0000 mg | ORAL_TABLET | Freq: Three times a day (TID) | ORAL | Status: DC
  Administered 2020-05-05 – 2020-05-17 (×34): 500 mg via ORAL
  Filled 2020-05-05 (×38): qty 1

## 2020-05-05 MED ORDER — PROCHLORPERAZINE MALEATE 5 MG PO TABS
5.0000 mg | ORAL_TABLET | Freq: Four times a day (QID) | ORAL | Status: DC | PRN
  Administered 2020-05-08: 5 mg via ORAL
  Administered 2020-05-08 – 2020-05-09 (×2): 10 mg via ORAL
  Filled 2020-05-05: qty 2
  Filled 2020-05-05: qty 1
  Filled 2020-05-05 (×2): qty 2

## 2020-05-05 MED ORDER — MAGNESIUM OXIDE 400 (241.3 MG) MG PO TABS
400.0000 mg | ORAL_TABLET | Freq: Two times a day (BID) | ORAL | Status: DC
  Administered 2020-05-05 – 2020-05-17 (×24): 400 mg via ORAL
  Filled 2020-05-05 (×24): qty 1

## 2020-05-05 MED ORDER — COLLAGENASE 250 UNIT/GM EX OINT
TOPICAL_OINTMENT | Freq: Every day | CUTANEOUS | Status: DC
  Filled 2020-05-05: qty 30

## 2020-05-05 MED ORDER — ALUM & MAG HYDROXIDE-SIMETH 200-200-20 MG/5ML PO SUSP: 30.0000 mL | ORAL | Status: DC | PRN

## 2020-05-05 MED ORDER — LEVOFLOXACIN 500 MG PO TABS
500.0000 mg | ORAL_TABLET | ORAL | Status: DC
Start: 2020-05-07 — End: 2020-05-17
  Administered 2020-05-07 – 2020-05-15 (×5): 500 mg via ORAL
  Filled 2020-05-05 (×6): qty 1

## 2020-05-05 MED ORDER — SODIUM BICARBONATE 650 MG PO TABS: 1300.0000 mg | ORAL_TABLET | Freq: Two times a day (BID) | ORAL | Status: DC

## 2020-05-05 MED ORDER — BISACODYL 10 MG RE SUPP
10.0000 mg | Freq: Every day | RECTAL | Status: DC | PRN
  Administered 2020-05-09: 10 mg via RECTAL
  Filled 2020-05-05: qty 1

## 2020-05-05 MED ORDER — ACETAMINOPHEN 500 MG PO TABS: 1000.0000 mg | ORAL_TABLET | Freq: Three times a day (TID) | ORAL | 0 refills | Status: DC

## 2020-05-05 MED ORDER — MUSCLE RUB 10-15 % EX CREA: 1.0000 "application " | TOPICAL_CREAM | CUTANEOUS | 0 refills | Status: DC | PRN

## 2020-05-05 MED ORDER — APIXABAN 2.5 MG PO TABS
2.5000 mg | ORAL_TABLET | Freq: Every day | ORAL | Status: DC
Start: 2020-05-05 — End: 2020-05-05
  Administered 2020-05-05: 2.5 mg via ORAL
  Filled 2020-05-05: qty 1

## 2020-05-05 MED ORDER — FLUCONAZOLE 100 MG PO TABS: 100.0000 mg | ORAL_TABLET | Freq: Every day | ORAL | Status: DC

## 2020-05-05 MED ORDER — POLYETHYLENE GLYCOL 3350 17 G PO PACK
17.0000 g | PACK | Freq: Every day | ORAL | Status: DC | PRN
  Administered 2020-05-09: 17 g via ORAL
  Filled 2020-05-05: qty 1

## 2020-05-05 MED ORDER — FLUCONAZOLE 100 MG PO TABS
100.0000 mg | ORAL_TABLET | Freq: Every day | ORAL | Status: AC
  Administered 2020-05-06 – 2020-05-14 (×9): 100 mg via ORAL
  Filled 2020-05-05 (×9): qty 1

## 2020-05-05 MED ORDER — GUAIFENESIN-DM 100-10 MG/5ML PO SYRP: 5.0000 mL | ORAL_SOLUTION | Freq: Four times a day (QID) | ORAL | Status: DC | PRN

## 2020-05-05 MED ORDER — COLLAGENASE 250 UNIT/GM EX OINT: TOPICAL_OINTMENT | Freq: Every day | CUTANEOUS | 0 refills | Status: DC

## 2020-05-05 MED ORDER — PROCHLORPERAZINE 25 MG RE SUPP: 12.5000 mg | Freq: Four times a day (QID) | RECTAL | Status: DC | PRN

## 2020-05-05 MED ORDER — SALINE SPRAY 0.65 % NA SOLN
1.0000 | Freq: Three times a day (TID) | NASAL | Status: DC
  Administered 2020-05-07 – 2020-05-17 (×24): 1 via NASAL
  Filled 2020-05-05: qty 44

## 2020-05-05 MED ORDER — ONDANSETRON HCL 4 MG/2ML IJ SOLN: 4.0000 mg | Freq: Once | INTRAMUSCULAR | 0 refills | Status: DC

## 2020-05-05 MED ORDER — PROSOURCE PLUS PO LIQD: 30.0000 mL | Freq: Two times a day (BID) | ORAL | Status: DC

## 2020-05-05 MED ORDER — TACROLIMUS 0.5 MG PO CAPS
0.5000 mg | ORAL_CAPSULE | Freq: Two times a day (BID) | ORAL | Status: DC
  Administered 2020-05-05 – 2020-05-17 (×24): 0.5 mg via ORAL
  Filled 2020-05-05 (×26): qty 1

## 2020-05-05 MED ORDER — COLLAGENASE 250 UNIT/GM EX OINT
TOPICAL_OINTMENT | Freq: Every day | CUTANEOUS | Status: DC
  Administered 2020-05-08 – 2020-05-09 (×2): 1 via TOPICAL
  Filled 2020-05-05 (×2): qty 30

## 2020-05-05 MED ORDER — DIPHENHYDRAMINE HCL 12.5 MG/5ML PO ELIX: 12.5000 mg | ORAL_SOLUTION | Freq: Four times a day (QID) | ORAL | Status: DC | PRN

## 2020-05-05 MED ORDER — SODIUM BICARBONATE 650 MG PO TABS
1300.0000 mg | ORAL_TABLET | Freq: Two times a day (BID) | ORAL | Status: DC
  Administered 2020-05-05 – 2020-05-17 (×23): 1300 mg via ORAL
  Filled 2020-05-05 (×25): qty 2

## 2020-05-05 MED ORDER — PANTOPRAZOLE SODIUM 40 MG PO TBEC: 40.0000 mg | DELAYED_RELEASE_TABLET | Freq: Two times a day (BID) | ORAL | Status: DC

## 2020-05-05 MED ORDER — BOOST / RESOURCE BREEZE PO LIQD CUSTOM: 1.0000 | Freq: Three times a day (TID) | ORAL | Status: DC

## 2020-05-05 MED ORDER — LEVOFLOXACIN 500 MG PO TABS: 500.0000 mg | ORAL_TABLET | ORAL | Status: DC

## 2020-05-05 MED ORDER — PANTOPRAZOLE SODIUM 40 MG PO TBEC
40.0000 mg | DELAYED_RELEASE_TABLET | Freq: Two times a day (BID) | ORAL | Status: DC
  Administered 2020-05-05 – 2020-05-17 (×24): 40 mg via ORAL
  Filled 2020-05-05 (×24): qty 1

## 2020-05-05 MED ORDER — ONDANSETRON HCL 4 MG/2ML IJ SOLN
4.0000 mg | Freq: Four times a day (QID) | INTRAMUSCULAR | Status: DC | PRN
  Administered 2020-05-10: 4 mg via INTRAVENOUS
  Filled 2020-05-05: qty 2

## 2020-05-05 MED ORDER — LEVOFLOXACIN 500 MG PO TABS
500.0000 mg | ORAL_TABLET | ORAL | Status: DC
  Administered 2020-05-05: 500 mg via ORAL
  Filled 2020-05-05: qty 1

## 2020-05-05 MED ORDER — APIXABAN 2.5 MG PO TABS: 2.5000 mg | ORAL_TABLET | Freq: Every day | ORAL | Status: DC

## 2020-05-05 MED ORDER — SODIUM CHLORIDE 0.9 % IV SOLN
500.0000 mg | INTRAVENOUS | Status: DC
  Administered 2020-05-07 – 2020-05-15 (×5): 500 mg via INTRAVENOUS
  Filled 2020-05-05 (×7): qty 10

## 2020-05-05 MED ORDER — SODIUM CHLORIDE 0.9 % IV SOLN
500.0000 mg | INTRAVENOUS | Status: DC
  Administered 2020-05-05: 500 mg via INTRAVENOUS
  Filled 2020-05-05: qty 10

## 2020-05-05 MED ORDER — METRONIDAZOLE 500 MG PO TABS: 500.0000 mg | ORAL_TABLET | Freq: Three times a day (TID) | ORAL | Status: DC

## 2020-05-05 MED ORDER — MUSCLE RUB 10-15 % EX CREA
TOPICAL_CREAM | CUTANEOUS | Status: DC | PRN
  Filled 2020-05-05: qty 85

## 2020-05-05 NOTE — Progress Notes (Incomplete)
   Subjective:   Hospital day:***  She is feeling ok this morning, just trying to sleep. She is not having any pain. She has only noticed a little additional bleeding.   She had stopped eliquis previously before coming in due to nosebleeds. She was told to cut it down to one dose per day if she had additional bleeding. They continued to she ended up stopping it all together. She is agreeable to continuing it once per day.   Overnight event:***  Objective:  Vital signs in last 24 hours: Vitals:   05/04/20 1724 05/04/20 1738 05/05/20 0003 05/05/20 0627  BP: (!) 150/106 (!) 147/98 (!) 140/99 (!) 142/101  Pulse: 97 87 72 97  Resp: 18 18 18 18   Temp: 97.9 F (36.6 C) 98.4 F (36.9 C) 98.3 F (36.8 C) 99.1 F (37.3 C)  TempSrc: Oral Oral Oral Oral  SpO2: 100% 100% 100% 100%  Weight:    47.6 kg  Height:        Physical Exam   Assessment/Plan: Christina Mack is a 55 y.o. female with hx of *** presenting with ***  Active Problems:   Weakness   Pressure ulcer of coccygeal region, stage 2 (HCC)   Thrombocytopenia (HCC)   Kidney transplant status   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(HCC)   Esophageal candidiasis (HCC)   Normocytic anemia   CKD (chronic kidney disease) stage 4, GFR 15-29 ml/min (HCC)   Wound of right groin   Gastric ulcer without hemorrhage or perforation   Malnutrition of moderate degree   Acute osteomyelitis of right pelvic region and thigh (HCC)   Abscess  ***  - restart eliquis 2.5 qd   Diet:  IVF:  VTE:  CODE:   Prior to Admission Living Arrangement:  Anticipated Discharge Location:  Barriers to Discharge:  Dispo: Anticipated discharge in approximately *** day(s).   Gaylan Gerold, DO 05/05/2020, 7:01 AM Pager: [phone] After 5pm on weekdays and 1pm on weekends: On Call pager [phone]

## 2020-05-05 NOTE — Progress Notes (Signed)
Inpatient Rehabilitation Medication Review by a Pharmacist  A complete drug regimen review was completed for this patient to identify any potential clinically significant medication issues.  Clinically significant medication issues were identified:  yes   Type of Medication Issue Identified Description of Issue Urgent (address now) Non-Urgent (address on AM team rounds) Plan Plan Accepted by Provider? (Yes / No / Pending AM Rounds)                                Additional Drug Therapy Needed  DC summary said to resume Valtrex and ferrous sulfate  Non-urgent Message PA    Other         Name of provider notified for urgent issues identified: Algis Liming, PA  Provider Method of Notification: Secure chat   For non-urgent medication issues to be resolved on team rounds tomorrow morning a [facility] Secure Chat Handoff was sent to:    Pharmacist comments:   Time spent performing this drug regimen review (minutes):  10   [place] 05/05/2020 4:27 PM

## 2020-05-05 NOTE — Progress Notes (Signed)
Inpatient Rehab Admissions Coordinator:   I have a bed on CIR for Pt. Today. Per attending service, epistaxis has resolved and ID has cleared Pt. For d/c to CIR with IV antibiotics.   Clemens Catholic, [place], [place] Admissions Coordinator  [phone] ([place]) [phone] (office)

## 2020-05-05 NOTE — Progress Notes (Signed)
Pharmacy Antibiotic Note  Christina Mack is a 55 y.o. 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 Hospital course complicated by bactermeia due to bacteroides/eggerthella species. Pharmacy has been consulted for daptomycin dosing at higher dose of 10mg /kg. Patient has been approved for CIR and will begin IV antibiotic therapy while inpatient. Plan per ID is to transition to oral levaquin and flagyl at discharge and finish 6 weeks total of antibiotics on February 26th. Patient is afebrile and Scr is elevated at 2.23.   Plan: Start daptomycin 500mg  Q48H  Monitor weekly CK (ordered for 2/3) Monitor renal function and adverse effects Follow transition to oral options closer to discharge  Height: 5\' 5"  (165.1 cm) Weight: 47.6 kg (104 lb 15 oz) IBW/kg (Calculated) : 57  Temp (24hrs), Avg:98.4 F (36.9 C), Min:97.9 F (36.6 C), Max:99.1 F (37.3 C)  Recent Labs  Lab 04/30/20 0717 05/01/20 0505 05/03/20 0142 05/04/20 0722 05/05/20 0717  WBC 8.9 8.2 7.7 7.7 8.1  CREATININE 2.78* 2.97* 2.62* 2.53* 2.23*    Estimated Creatinine Clearance: 21.9 mL/min (A) (by C-G formula based on SCr of 2.23 mg/dL (H)).    Allergies  Allergen Reactions  . Zosyn [Piperacillin Sod-Tazobactam So] Other (See Comments)    Thrombocytopenia  . Codeine     vomiting  . Hydromorphone Nausea And Vomiting  . Oxycodone-Acetaminophen Itching, Nausea Only, Other (See Comments) and Nausea And Vomiting    Other Reaction: HA    Antimicrobials this admission: Diflucan 1/22> Dapto 2/2>> Levaquin 2/2>> Flagyl 2/2>>  Dose adjustments this admission:   Microbiology results: 1/1 blood>>Bacteroides ovatus/xylanisolvens and Eggerthella lenta can't see sens. 1/25 blood>>negF 1/28 abd>>enterococcus faecium  Thank you for allowing pharmacy to be a part of this patient's care.  Cephus Slater, PharmD, [facility] Resident [phone] 05/05/2020 11:43 AM

## 2020-05-05 NOTE — Progress Notes (Signed)
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 Date: 04/03/2016 - still active     Deduct: $0 (does not have)   OOP Max: $3,000 ($53.64 met) CIR: : $168/per admission SNF: $33/day; limited by medical necessity Outpatient: $33/per visit; limited by medical necessity review Home Health: 100% coverage; limited by medical necessity review DME: 80% coverage; 20% co-insurance Providers: In network  SECONDARY: none       Policy#:    Phone#:   The "Data Collection Information Summary" for patients in Inpatient Rehabilitation Facilities with attached "Privacy Act [place] Records" was provided and verbally reviewed with: Patient  Emergency Contact Information         Contact Information    Name Relation Home Work Mobile   [facility] Daughter [phone]        Current Medical History  Patient Admitting Diagnosis: Debility, Weakness History of Present Illness: Ms. 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 Her symptoms started 04/04/20, at this she presented to the [facility] ED and was admitted after having progressive weakness and decline in her overall functional status.At this time she also developed pain over a large deep wound of her right labia and inner thigh. She was found to have a WBC of 19, Lactacte of 1.6. Imaging was  performed which revealed large right perineal mass with new extension into surrounding soft tissue with stranding and gas. No abscess collection noted. She was evaluated by gen surg and gyn/oncology who recommended IV abx alone. There was initial concern for necrotizing fasciitis, however, both consults did not believe her wound was consistent with this. ID was also consulted, who recommended oral levofloxacin and metronidazole with an abx end date of 04/24/20.  During this admission, she was also evaluated for her vulvar cancer with which gyn onc recommended outpatient PET.Her hospital stay wasalsocomplicated by AKI on CKD Stage 5. Nephrology was consulted, who recommended holding Cellcept and reducing dose of Prograf. Re-initiation of HD was contemplated but ultimatleyheld off. Stay was also complicated by acute on chronic anemia requiring pRBC transfusion, diarrhea negative for C.diff and mild stable hyponatremia.  Since being discharged, Ms. Christina Mack notes she has been unable to eat or drink without vomiting, and is so weak that she can no longer stand. On arrival to the ED, patient was noted to be tachycardic to 137, BP of 155/97, with respiratory rate of 29 with oxygen saturations of 100%. Initial lab work remarkable for anemia of 8.4 with platelets of 102; no leukocytosis. CMP remarkable for elevated BUN of 59 and creatinine of 3.33. CT head negative for acute intracranial abnormalities. 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 This has eroded deeply since the end of her radiation therapy and became superinfected recently complicated by bacteremia due to bacteroides and eggerthella species. RI with multilocular fluid collection extending in to the inferior left pelvic soft tissues surrounding the pubic rami and extending up to anterior thigh c/w abscess. Osteo suggested in the anterior left pubic symphysis. Surgical services have evaluated - no  surgical plans from gyn onc, ortho or plastics at this time Per ID, pt now with  bactermeia due to bacteroides/eggerthella species.Pharmacy has been consulted for daptomycindosing at higher dose of 7m/kg.Patient has been approved for CIR and will begin IV antibiotic therapy while inpatient. Plan per ID is to transition to oral levaquin and flagyl at discharge and finish 6 weeks total of antibiotics on February 26th. Pt. Is progressing with therapies and both PT and OT are recommending CIR-level therapies to assist return to PLOF.    [facility]  has been reviewed by the rehabilitation admission coordinator and physician.  Past Medical History      Past Medical History:  Diagnosis Date  . Complication of anesthesia    nausea and vomiting    Family History   family history is not on file.  Prior Rehab/Hospitalizations Has the patient had prior rehab or hospitalizations prior to admission? Yes  Has the patient had major surgery during 100 days prior to admission? Yes             Current Medications  Current Facility-Administered Medications:  .  (feeding supplement) PROSource Plus liquid 30 mL, 30 mL, Oral, BID BM, Hoffman, Erik C, DO, 30 mL at 05/04/20 1311 .  0.9 %  sodium chloride infusion (Manually program via Guardrails IV Fluids), , Intravenous, Once, Danis, HEstill CottaIII, MD .  0.9 %  sodium chloride infusion, , Intravenous, Continuous, Danis, HEstill CottaIII, MD, Last Rate: 10 mL/hr at 04/24/20 0858, New Bag at 04/24/20 0858 .  acetaminophen (TYLENOL) tablet 1,000 mg, 1,000 mg, Oral, TID, WMaudie Mercury MD, 1,000 mg at 05/05/20 [phone].  apixaban (ELIQUIS) tablet 2.5 mg, 2.5 mg, Oral, Daily, NGaylan Gerold DO .  collagenase (SANTYL) ointment, , Topical, Daily, HLucious Groves DO, Given at 05/05/20 [phone].  DAPTOmycin (CUBICIN) 500 mg in sodium chloride 0.9 % IVPB, 500 mg, Intravenous, Q48H, Dixon, SMelton Krebs NP .  feeding  supplement (BOOST / RESOURCE BREEZE) liquid 1 Container, 1 Container, Oral, TID BM, HLucious Groves DO, 1 Container at 05/04/20 1311 .  fluconazole (DIFLUCAN) tablet 100 mg, 100 mg, Oral, Daily, NGaylan Gerold DO, 100 mg at 05/05/20 0843 .  levofloxacin (LEVAQUIN) tablet 500 mg, 500 mg, Oral, Q48H, Dixon, Stephanie N, NP .  lidocaine (XYLOCAINE) 1 % (with pres) injection, , Infiltration, PRN, WCorrie Mckusick DO, 10 mL at 04/30/20 1718 .  magnesium oxide (MAG-OX) tablet 400 mg, 400 mg, Oral, BID, DNelida MeuseIII, MD, 400 mg at 05/05/20 0842 .  methadone (DOLOPHINE) tablet 2.5 mg, 2.5 mg, Oral, Q12H, Danis, HEstill CottaIII, MD, 2.5 mg at 05/05/20 0842 .  metroNIDAZOLE (FLAGYL) tablet 500 mg, 500 mg, Oral, Q8H, Dixon, SMelton Krebs NP .  Muscle Rub CREA, , Topical, PRN, KRiesa Pope MD, Given at 04/26/20 0757 .  ondansetron (ZOFRAN) injection 4 mg, 4 mg, Intravenous, Once, Katsadouros, Vasilios, MD .  ondansetron (ZOFRAN) injection 4 mg, 4 mg, Intravenous, Q6H PRN, WAlexandria Lodge MD .  oxymetazoline (AFRIN) 0.05 % nasal spray 1 spray, 1 spray, Each Nare,  BID, Harvie Heck, MD, 1 spray at 05/05/20 0844 .  pantoprazole (PROTONIX) EC tablet 40 mg, 40 mg, Oral, BID AC, Nelida Meuse III, MD, 40 mg at 05/05/20 0843 .  predniSONE (DELTASONE) tablet 5 mg, 5 mg, Oral, Q breakfast, Danis, Estill Cotta III, MD, 5 mg at 05/05/20 0843 .  sodium bicarbonate tablet 1,300 mg, 1,300 mg, Oral, BID, Alexandria Lodge, MD, 1,300 mg at 05/05/20 0841 .  tacrolimus (PROGRAF) capsule 0.5 mg, 0.5 mg, Oral, BID, Danis, Estill Cotta III, MD, 0.5 mg at 05/05/20 7989  Patients Current Diet:     Diet Order                  Diet regular Room service appropriate? Yes; Fluid consistency: Thin  Diet effective now                  Precautions / Restrictions Precautions Precautions: Fall Precaution Comments: recent falls Restrictions Weight Bearing Restrictions: No   Has the patient had 2 or more falls or a fall  with injury in the past year? No  Prior Activity Level Community (5-7x/wk): Active in the community PTA  Prior Functional Level Self Care: Did the patient need help bathing, dressing, using the toilet or eating? Independent  Indoor Mobility: Did the patient need assistance with walking from room to room (with or without device)? Independent  Stairs: Did the patient need assistance with internal or external stairs (with or without device)? Independent  Functional Cognition: Did the patient need help planning regular tasks such as shopping or remembering to take medications? [place] / Equipment Home Assistive Devices/Equipment: Blood pressure cuff Home Equipment: Walker - 2 wheels,Cane - single point,Transport chair  Prior Device Use: Indicate devices/aids used by the patient prior to current illness, exacerbation or injury? None of the above  Current Functional Level Cognition  Overall Cognitive Status: Within Functional Limits for tasks assessed Orientation Level: Oriented X4 General Comments: Fearful of falls, agreeable to standing exercises and pre-gait training at bedside; flat affect    Extremity Assessment (includes Sensation/Coordination)  Upper Extremity Assessment: Defer to OT evaluation  Lower Extremity Assessment: Generalized weakness    ADLs  Overall ADL's : Needs assistance/impaired Eating/Feeding: Modified independent,Sitting,Bed level Grooming: Wash/dry face,Wash/dry hands,Min guard,Standing Grooming Details (indicate cue type and reason): intermittent minA for sitting balance when in unsupported sitting Upper Body Bathing: Minimal assistance,Standing Lower Body Bathing: Moderate assistance,Sitting/lateral leans Upper Body Dressing : Min guard,Sitting Lower Body Dressing: Maximal assistance,Sitting/lateral leans Lower Body Dressing Details (indicate cue type and reason): socks Toilet Transfer: Minimal  assistance,Stand-pivot,BSC Toilet Transfer Details (indicate cue type and reason): Simulated with stand-pivot transfer to recliner with Min A +2. Patient not heavily reliant on external assist but +2 for patient peace of mind. Toileting- Clothing Manipulation and Hygiene: Maximal assistance Functional mobility during ADLs: Minimal assistance General ADL Comments: Min A +2 and use of RW for transfer to recliner. Patient with increased anxiety with thought of ambulation.    Mobility  Overal bed mobility: Needs Assistance Bed Mobility: Rolling,Sidelying to Sit Rolling: Supervision Sidelying to sit: Min assist Supine to sit: Min guard Sit to supine: Min assist General bed mobility comments: Log roll toward R EOB with HOB partially elevated. Increased time/effort to bring trunk upright. Needs BLE assist to return to supine.    Transfers  Overall transfer level: Needs assistance Equipment used: Rolling walker (2 wheeled) Transfers: Sit to/from Stand Sit to Stand: Min assist Stand pivot transfers: Min assist  Lateral/Scoot Transfers: Min assist General transfer comment: from elevated EOB height to RW. Pt with good sequencing/safety awareness but anxious/fearful of falling    Ambulation / Gait / Stairs / Wheelchair Mobility  Ambulation/Gait Ambulation/Gait assistance: Min guard,+2 safety/equipment Gait Distance (Feet): 40 Feet Assistive device: Rolling walker (2 wheeled) Gait Pattern/deviations: Step-through pattern,Decreased stride length,Trunk flexed,Narrow base of support General Gait Details: Slow and guarded with RW for support. Chair follow utilized for safety as pt fatigues quickly. L hip pain and fatigue limiting distance. Gait velocity: decreased Gait velocity interpretation: <1.31 ft/sec, indicative of household ambulator    Posture / Balance Dynamic Sitting Balance Sitting balance - Comments: Able to maintain static sitting balance at EOB with perference to unilateral UE  support on bed surface. Balance Overall balance assessment: Needs assistance Sitting-balance support: Feet supported Sitting balance-Leahy Scale: Fair Sitting balance - Comments: Able to maintain static sitting balance at EOB with perference to unilateral UE support on bed surface. Standing balance support: Bilateral upper extremity supported Standing balance-Leahy Scale: Poor Standing balance comment: Reliant on UE support of RW    Special needs/care consideration Skin: labia wound, ecchymosis on BUEs ; Pt. On IV antibiotics, Contact precautions   Previous Home Environment (from acute therapy documentation) Living Arrangements: Spouse/significant other Available Help at Discharge: Family,Available PRN/intermittently Type of Home: [place]: Two level,Able to live on main level with bedroom/bathroom Alternate Level Stairs-Number of Steps: flight but can stay on main level Home Access: Stairs to enter Entrance Stairs-Number of Steps: 7 STE in front no rails, 3STE in the back with rails but cannot reach both Bathroom Shower/Tub: Tub/shower unit,Walk-in shower (downstairs bathroom is a tub) Biochemist, clinical: [place]: No  Discharge Living Setting Plans for Discharge Living Setting: Patient's home Type of Home at Discharge: House Discharge Home Layout: Two level,Able to live on main level with bedroom/bathroom Alternate Level Stairs-Number of Steps: flight Discharge Home Access: Stairs to enter Entrance Stairs-Rails: [place] of Steps: 4 (back) Discharge Bathroom Shower/Tub: Tub/shower unit Discharge Bathroom Toilet: Standard Discharge Bathroom Accessibility: Yes How Accessible: Accessible via walker Does the patient have any problems obtaining your medications?: No  Social/Family/Support Systems Patient Roles: Spouse Contact Information: [phone] Anticipated Caregiver: Brinda Focht Anticipated Caregiver's Contact  Information: [phone] Ability/Limitations of Caregiver: Works during day, Pt.'s aunt can be with her while he works Leonie Man [phone]  Goals Patient/Family Goal for Rehab: PT/OT Supervision Expected length of stay: 7-10 days Pt/Family Agrees to Admission and willing to participate: Yes Program Orientation Provided & Reviewed with Pt/Caregiver Including Roles  & Responsibilities: Yes  Decrease burden of Care through IP rehab admission: Specialzed equipment needs, Decrease number of caregivers, Bowel and bladder program and Patient/family education  Possible need for SNF placement upon discharge: Not anticipated  Patient Condition: I have reviewed medical records from [facility], spoken with CSW, and patient. I met with patient at the bedside for inpatient rehabilitation assessment.  Patient will benefit from ongoing PT and OT, can actively participate in 3 hours of therapy a day 5 days of the week, and can make measurable gains during the admission.  Patient will also benefit from the coordinated team approach during an Inpatient Acute Rehabilitation admission.  The patient will receive intensive therapy as well as Rehabilitation physician, nursing, social worker, and care management interventions.  Due to safety, skin/wound care, disease management, medication administration, pain management and patient education the patient requires 24 hour a day rehabilitation nursing.  The patient is  currently min-mod A ** with mobility and basic ADLs.  Discharge setting and therapy post discharge at home with home health is anticipated.  Patient has agreed to participate in the Acute Inpatient Rehabilitation Program and will admit today.  Preadmission Screen Completed By:  Genella Mech, 05/05/2020 11:50 AM ______________________________________________________________________   Discussed status with Dr. Posey Pronto  on 05/05/20 at 55 and received approval for admission today.  Admission  Coordinator:  Genella Mech, CCC-SLP, time 930/Date 05/05/20   Assessment/Plan: Diagnosis: Debility  1. Does the need for close, 24 hr/day Medical supervision in concert with the patient's rehab needs make it unreasonable for this patient to be served in a less intensive setting? Yes  2. 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 Due to safety, skin/wound care, disease management, pain management and patient education, does the patient require 24 hr/day rehab nursing? Yes 4. Does the patient require coordinated care of a physician, rehab nurse, PT, OT to address physical and functional deficits in the context of the above medical diagnosis(es)? Yes Addressing deficits in the following areas: balance, endurance, locomotion, strength, transferring, bathing, dressing, toileting and psychosocial support 5. Can the patient actively participate in an intensive therapy program of at least 3 hrs of therapy 5 days a week? Yes 6. The potential for patient to make measurable gains while on inpatient rehab is excellent and good 7. Anticipated functional outcomes upon discharge from inpatient rehab: supervision PT, supervision OT, n/a SLP 8. Estimated rehab length of stay to reach the above functional goals is: 7-10 days. 9. Anticipated discharge destination: Home 10. Overall Rehab/Functional Prognosis: good and fair   MD Signature: Delice Lesch, MD, ABPMR

## 2020-05-05 NOTE — Progress Notes (Signed)
[facility] for Infectious Disease  Date of Admission:  04/20/2020      Total days of antibiotics Levaquin + flagyl through 1/22 (3 weeks)           ASSESSMENT: JAKEIA CARRERAS is a 55 y.o. female on immunosuppression for kidney transplant with large chronic deep ulceration of the right labia/upper inner thigh fold. This has eroded deeply over the course of radiation therapy.  Became superinfected recently complicated by bacteremia due to bacteroides and eggerthella species. Received 3 weeks PO levaquin + flagyl (severe thrombocytopenia d/t zosyn) through 1/22. She was admitted for evaluation of involuntary movements. Unclear as to what these were due to but unlikely stroke/seizures since they were reproducible. She has not noticed any recurrence.   Aside from the large ulcerated lesion, on exam she has significant left hip pain that limits her ability to sit comfortably and walking makes it worse - MRI with multilocular fluid collection extending in to the inferior left pelvic soft tissues surrounding the pubic rami and extending up to anterior thigh c/w abscess. Osteo suggested in the anterior left pubic symphysis. [facility] team provided wound care recs with santyl to the more superficial areas with heavy slough.  Surgical services have evaluated (ortho, plastics, gyn/onc) no surgical plans at this time. Cultures from abscess in the left hemipelvis grew out VRE (E. Faecium). Discussed possible treatment options with her again today - she is open to IV therapy while in the hospital but clear she wants no PICC at home, no reason given. She has been thoroughly consented and informed. Will start higher dose Daptomycin while inpatient and transition to [facility] outpatient to get her through 6 weeks. Will need repeat imaging to re-eval undrained abscess/phlegmon described in MRI. CRP/ESR in AM and again at lab visit outpatient.   With presumption her infection is polymicrobial will add back  levaquin and flagyl to finish out 6 weeks total through February 26th.   Thrombocytopenia - Thought to be due to zosyn previously; will add to allergy list today. Platelets up to 122K today, closer to her chronic nadir. Will need to monitor for toxicity on tedezolid. Repeat weekly.   Esophageal candidiasis - continue fluconazole 21 days through 2/12    PLAN: 1. Start Daptomycin 10 mg/kg Q48h while inpatient/CIR 2. Then finish out course with Tedezolid through March 16th  3. Start back Levaquin 500 mg PO Q48h and Metronidazole 500 mg TID PO --> continue through February 26th  4. Continue fluconazole 21 days --> February 12th 5. Will help with appt at [facility] in 4 weeks with lab visit 1 week after starting Tedezolid.  6. CRP / ESR in AM  7. Weekly CK, CBC, BMP while in rehab please.   Appointments:    Available as needed for discharge coordination or if any changes in her condition.     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 (feeding supplement) PROSource Plus  30 mL Oral BID BM  . sodium chloride   Intravenous  Once  . acetaminophen  1,000 mg Oral TID  . apixaban  2.5 mg Oral Daily  . collagenase   Topical Daily  . feeding supplement  1 Container Oral TID BM  . fluconazole  100 mg Oral Daily  . magnesium oxide  400 mg Oral BID  . methadone  2.5 mg Oral Q12H  . ondansetron (ZOFRAN) IV  4 mg Intravenous Once  . oxymetazoline  1 spray Each Nare BID  . pantoprazole  40 mg Oral BID AC  . predniSONE  5 mg Oral Q breakfast  . sodium bicarbonate  1,300 mg Oral BID  .  tacrolimus  0.5 mg Oral BID    SUBJECTIVE: Doing well. Eating better. Enjoying snacks from home.  No new complaints now that her nose bleeding has stopped.    Review of Systems: Review of Systems  Constitutional: Negative for chills and fever.  HENT: Negative for tinnitus.   Eyes: Negative for blurred vision and photophobia.  Respiratory: Negative for cough and sputum production.   Cardiovascular: Negative for chest pain.  Gastrointestinal: Negative for diarrhea, nausea and vomiting.  Genitourinary: Negative for dysuria.  Musculoskeletal: Positive for back pain and joint pain.  Skin: Negative for rash.  Neurological: Negative for headaches.    Allergies  Allergen Reactions  . Zosyn [Piperacillin Sod-Tazobactam So] Other (See Comments)    Thrombocytopenia  . Codeine     vomiting  . Hydromorphone Nausea And Vomiting  . Oxycodone-Acetaminophen Itching, Nausea Only, Other (See Comments) and Nausea And Vomiting    Other Reaction: HA    OBJECTIVE: Vitals:   05/04/20 1724 05/04/20 1738 05/05/20 0003 05/05/20 0627  BP: (!) 150/106 (!) 147/98 (!) 140/99 (!) 142/101  Pulse: 97 87 72 97  Resp: _0 Temp: 97.9 F (36.6 C) 98.4 F (36.9 C) 98.3 F (36.8 C) 99.1 F (37.3 C)  TempSrc: Oral Oral Oral Oral  SpO2: 100% 100% 100% 100%  Weight:    47.6 kg  Height:       Body mass index is 17.46 kg/m.   Physical Exam Constitutional:      Appearance: Normal appearance. She is ill-appearing.     Comments: Thin appearing, resting comfortably in bed.   HENT:     Mouth/Throat:     Mouth: Mucous membranes are moist.     Pharynx: Oropharynx is clear.  Eyes:     General: No scleral icterus. Pulmonary:     Effort: Pulmonary effort is normal.  Musculoskeletal:     Comments: L thigh soft anteriorly and laterally. No erythema.   Neurological:     Mental Status: She is alert and oriented to person, place, and time.  Psychiatric:        Mood and Affect: Mood normal.         Thought Content: Thought content normal.      Lab Results Lab Results  Component Value Date   WBC 8.1 05/05/2020   HGB 8.4 (L) 05/05/2020   HCT 26.9 (L) 05/05/2020   MCV 90.3 05/05/2020   PLT 120 (L) 05/05/2020    Lab Results  Component Value Date   CREATININE 2.23 (H) 05/05/2020   BUN 43 (H) 05/05/2020   NA 130 (L) 05/05/2020   K 4.5 05/05/2020   CL 98 05/05/2020   CO2 18 (L) 05/05/2020    Lab Results  Component Value Date   ALT 8 04/24/2020   AST 14 (L) 04/24/2020   ALKPHOS 102 04/24/2020   BILITOT  0.9 04/24/2020     Microbiology: BCx 1/25 >> no growth, prelim Wound Cx 1/28 >> GS negative, enterococcus faecalis   Stephanie Dixon, MSN, NP-C [facility]  [email or web address] Pager: [phone] Office: [phone] [facility] Main Line: [phone]  

## 2020-05-05 NOTE — H&P (Signed)
Physical Medicine and Rehabilitation Admission H&P    Chief Complaint  Patient presents with  . Debility.     HPI: Christina Mack. 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  History taken from chart review and patient.  CT head done due to fall and showed tiny chronic left thalamic infarct and negative for acute changes.  Patient declined EEG and jerking felt to be due to metabolic derangements.  Nephrology consulted for input on progressive renal failure with failure to thrive and 50 lbs unintentional weight loss. N/V felt to be multifactorial, hemodialysis discussed (patient did not want to start HD) and medication changes recommended with involvement of palliative care to help determine GOC.  Failing kidney felt to be due to sarcopenia and poor nutrition--as renal function stable, nephrology has signed off with recommendations of IVF prn for hydration.    Dr. Pincus Sanes consulted due to ongoing nausea and vomiting (started last fall) and endoscopy done on 04/24/2020 revealing non bleeding gastric ulcer with esophageal plaques consistent with candidiasis. She was started on IV Diflucan X 2 weeks with recommendations for PPI X 6 weeks and cleared to resume [place].  She completed her antibiotic regimen on 01/22 and had recurrent shaking episode on 04/26/2020, questioned to be syncopal.  ID recommended monitoring patient off antibiotics and question shaking episodes as rigors.  MRI pelvis done 04/27/2020 for follow up and showed large area of ulceration with diffuse phlegmon, extensive myositis of bilateral adductor and anterior muscular compartment of both thighs, multilocular abscess in inferior left pelvis surrounding pubic rami  and extending to upper left thigh as well as nondisplaced inferior right pubic fracture with suggestion of osteomyelitis involving pubic rami.   Dr. Delsa Sale consulted and recommended debridement and wound care. Plastics consulted for input and did not feel surgical debridement needed.  She underwent CT guided biopsy of left abdominal wall on 04/22/2020. Hip aspirate grew out enterococcus faecium and daptomycin recommended -->patient agreeable to IV therapy in hospital--daptomycin higher dose and to transition to [facility] on outpatient basis for 6 week antibiotic therapy.  Levaquin and metronidazole added back due to presumptive polymicrobial infection.  She had drop in Hgb to 6.9 on 05/04/2020 and was transfused with one unit PRBC. She did develop nosebleed last evening and eliquis decreased to 2.5 mg daily. She has had issues with significant left hip pain with weakness as well as anxiety/fear of falls affecting mobility and ADLs. Therapy ongoing and currently working on balance and standing--has been able to do some walking. CIR recommended due to functional decline.  Please see preadmission assessment from earlier today as well.    Review of Systems  Constitutional: Positive for malaise/fatigue.  HENT: Negative for hearing loss and tinnitus.   Eyes: Negative for blurred vision and double vision.  Respiratory: Negative for cough and shortness of breath.   Cardiovascular: Negative for chest pain and palpitations.  Gastrointestinal: Negative for abdominal pain, constipation, nausea and vomiting.  Genitourinary: Negative for dysuria and urgency.  Musculoskeletal: Negative for back pain, joint pain and myalgias.  Skin: Negative for itching and rash.  Neurological: Positive for focal weakness and weakness. Negative for dizziness, sensory change and headaches.  Psychiatric/Behavioral: Negative for depression. The patient is not nervous/anxious and does  not have insomnia.   All other systems reviewed  and are negative.    Past Medical History:  Diagnosis Date  . Complication of anesthesia    nausea and vomiting    Past Surgical History:  Procedure Laterality Date  . BIOPSY  04/24/2020   Procedure: BIOPSY;  Surgeon: Doran Stabler, MD;  Location: [place];  Service: Gastroenterology;;  . ESOPHAGOGASTRODUODENOSCOPY (EGD) WITH PROPOFOL N/A 04/24/2020   Procedure: ESOPHAGOGASTRODUODENOSCOPY (EGD) WITH PROPOFOL;  Surgeon: Doran Stabler, MD;  Location: [place];  Service: Gastroenterology;  Laterality: N/A;  . IR US GUIDE BX ASP/DRAIN  04/30/2020    Family History  Problem Relation Age of Onset  . Cerebral aneurysm Mother   . AAA (abdominal aortic aneurysm) Brother   . Heart attack Maternal Grandmother   . Brain cancer Maternal Grandfather     Social History:  Married --lives with husband. 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   She reports that she has never smoked. She has never used smokeless tobacco. No history on file for alcohol use and drug use.    Allergies  Allergen Reactions  . Zosyn [Piperacillin Sod-Tazobactam So] Other (See Comments)    Thrombocytopenia  . Codeine     vomiting  . Hydromorphone Nausea And Vomiting  . Oxycodone-Acetaminophen Itching, Nausea Only, Other (See Comments) and Nausea And Vomiting    Other Reaction: HA    Medications Prior to Admission  Medication Sig Dispense Refill  . apixaban (ELIQUIS) 2.5 MG TABS tablet Take 2.5 mg by mouth 2 (two) times daily.    . ferrous sulfate 325 (65 FE) MG tablet Take 325 mg by mouth 2 (two) times daily with a meal.    . levofloxacin (LEVAQUIN) 500 MG tablet Take 500 mg by mouth every other day. For 8 days    . loperamide (IMODIUM) 2 MG capsule Take 2 mg by mouth 4 (four) times daily as needed for diarrhea or loose stools.    . magnesium oxide (MAG-OX) 400 MG tablet Take 400 mg by mouth 2 (two) times daily.    . methadone (DOLOPHINE) 5  MG tablet Take 2.5 mg by mouth every 12 (twelve) hours.    . metroNIDAZOLE (FLAGYL) 500 MG tablet Take 500 mg by mouth 3 (three) times daily. For 7 days    . ondansetron (ZOFRAN) 8 MG tablet Take 8 mg by mouth every 8 (eight) hours as needed for nausea or vomiting.    . predniSONE (DELTASONE) 5 MG tablet Take 5 mg by mouth daily with breakfast.    . tacrolimus (PROGRAF) 0.5 MG capsule Take 0.5 mg by mouth daily.    . valACYclovir (VALTREX) 500 MG tablet Take 500 mg by mouth 2 (two) times daily.      Drug Regimen Review  Drug regimen was reviewed and remains appropriate with no significant issues identified  Home: Home Living Family/patient expects to be discharged to:: Private residence Living Arrangements: Spouse/significant other Available Help at Discharge: Family,Available PRN/intermittently Type of Home: House Home Access: Stairs to enter [employer] of Steps: 7 STE in front no rails, 3STE in the back with rails but cannot reach both Home Layout: Two level,Able to live on main level with bedroom/bathroom Alternate Level Stairs-Number of Steps: flight but can stay on main level Bathroom Shower/Tub: Tub/shower unit,Walk-in shower (downstairs bathroom is a tub) Biochemist, clinical: Standard Home Equipment: Walker - 2 wheels,Cane - single point,Transport chair  Functional History: Prior Function Level of Independence: Independent  Functional Status:  Mobility: Bed Mobility Overal bed mobility: Needs Assistance Bed Mobility: Rolling,Sidelying to Sit Rolling: Supervision Sidelying to sit: Min assist Supine to sit: Min guard Sit to supine: Min assist General bed mobility comments: Log roll toward R EOB with HOB partially elevated. Increased time/effort to bring trunk upright. Needs BLE assist to return to supine. Transfers Overall transfer level: Needs assistance Equipment used: Rolling walker (2 wheeled) Transfers: Sit to/from Stand Sit to Stand: Min assist Stand  pivot transfers: Min assist  Lateral/Scoot Transfers: Min assist General transfer comment: from elevated EOB height to RW. Pt with good sequencing/safety awareness but anxious/fearful of falling Ambulation/Gait Ambulation/Gait assistance: Min guard,+2 safety/equipment Gait Distance (Feet): 40 Feet Assistive device: Rolling walker (2 wheeled) Gait Pattern/deviations: Step-through pattern,Decreased stride length,Trunk flexed,Narrow base of support General Gait Details: Slow and guarded with RW for support. Chair follow utilized for safety as pt fatigues quickly. L hip pain and fatigue limiting distance. Gait velocity: decreased Gait velocity interpretation: <1.31 ft/sec, indicative of household ambulator    ADL: ADL Overall ADL's : Needs assistance/impaired Eating/Feeding: Modified independent,Sitting,Bed level Grooming: Wash/dry face,Wash/dry hands,Min guard,Standing Grooming Details (indicate cue type and reason): intermittent minA for sitting balance when in unsupported sitting Upper Body Bathing: Minimal assistance,Standing Lower Body Bathing: Moderate assistance,Sitting/lateral leans Upper Body Dressing : Min guard,Sitting Lower Body Dressing: Maximal assistance,Sitting/lateral leans Lower Body Dressing Details (indicate cue type and reason): socks Toilet Transfer: Minimal assistance,Stand-pivot,BSC Toilet Transfer Details (indicate cue type and reason): Simulated with stand-pivot transfer to recliner with Min A +2. Patient not heavily reliant on external assist but +2 for patient peace of mind. Toileting- Clothing Manipulation and Hygiene: Maximal assistance Functional mobility during ADLs: Minimal assistance General ADL Comments: Min A +2 and use of RW for transfer to recliner. Patient with increased anxiety with thought of ambulation.  Cognition: Cognition Overall Cognitive Status: Within Functional Limits for tasks assessed Orientation Level: Oriented  X4 Cognition Arousal/Alertness: Awake/alert Behavior During Therapy: Flat affect,WFL for tasks assessed/performed Overall Cognitive Status: Within Functional Limits for tasks assessed General Comments: Fearful of falls, agreeable to standing exercises and pre-gait training at bedside; flat affect   Blood pressure 131/72, pulse 97, temperature 98.3 F (36.8 C), temperature source Oral, resp. rate 17, height 5\' 5"  (1.651 m), weight 47.6 kg, SpO2 99 %. Physical Exam Vitals reviewed.  Constitutional:      Comments: Cachectic  HENT:     Head: Normocephalic and atraumatic.     Right Ear: External ear normal.     Left Ear: External ear normal.     Nose: Nose normal.  Eyes:     General:        Right eye: No discharge.        Left eye: No discharge.     Extraocular Movements: Extraocular movements intact.  Cardiovascular:     Rate and Rhythm: Normal rate and regular rhythm.  Pulmonary:     Effort: Pulmonary effort is normal. No respiratory distress.     Breath sounds: No stridor.  Abdominal:     General: Abdomen is flat. Bowel sounds are normal. There is no distension.  Musculoskeletal:     Cervical back: Normal range of motion and neck supple.     Comments: No edema or tenderness in extremities  Skin:    General: Skin is dry.     Comments: Scattered healing hematomas  Neurological:     Mental Status: She is alert and oriented  to person, place, and time.     Comments: Alert Motor: Bilateral upper extremities: 5/5 proximal distal Right lower extremity: Hip flexion, knee extension 4/5, ankle dorsiflexion 5/5 Left lower extremity: Hip flexion, knee extension 4 -/5, ankle dorsiflexion 5/5 Station intact light touch  Psychiatric:        Mood and Affect: Mood normal.        Behavior: Behavior normal.     Results for orders placed or performed during the hospital encounter of 04/20/20 (from the past 48 hour(s))  Basic metabolic panel     Status: Abnormal   Collection Time:  05/04/20  7:22 AM  Result Value Ref Range   Sodium 131 (L) 135 - 145 mmol/L   Potassium 5.1 3.5 - 5.1 mmol/L   Chloride 101 98 - 111 mmol/L   CO2 18 (L) 22 - 32 mmol/L   Glucose, Bld 128 (H) 70 - 99 mg/dL    Comment: Glucose reference range applies only to samples taken after fasting for at least 8 hours.   BUN 46 (H) 6 - 20 mg/dL   Creatinine, Ser 2.53 (H) 0.44 - 1.00 mg/dL   Calcium 8.4 (L) 8.9 - 10.3 mg/dL   GFR, Estimated 22 (L) >60 mL/min    Comment: (NOTE) Calculated using the CKD-EPI Creatinine Equation (2021)    Anion gap 12 5 - 15    Comment: Performed at [facility] [address] [place], [place] [address]  CBC     Status: Abnormal   Collection Time: 05/04/20  7:22 AM  Result Value Ref Range   WBC 7.7 4.0 - 10.5 K/uL   RBC 2.38 (L) 3.87 - 5.11 MIL/uL   Hemoglobin 6.9 (LL) 12.0 - 15.0 g/dL    Comment: REPEATED TO VERIFY ATC AT 0812 [id number] [id number] NO ANSWER TO [id number] THIS CRITICAL RESULT HAS VERIFIED AND BEEN CALLED TO ATC NO ANSWER BY AMANDA LEONARD ON 02 01 2022 AT 0900, AND HAS BEEN READ BACK.     HCT 21.3 (L) 36.0 - 46.0 %   MCV 89.5 80.0 - 100.0 fL   MCH 29.0 26.0 - 34.0 pg   MCHC 32.4 30.0 - 36.0 g/dL   RDW 16.9 (H) 11.5 - 15.5 %   Platelets 132 (L) 150 - 400 K/uL    Comment: Immature Platelet Fraction may be clinically indicated, consider ordering this additional test [id number]    nRBC 0.0 0.0 - 0.2 %    Comment: Performed at [facility] [address], [place], [place] [address]  Prepare RBC (crossmatch)     Status: None   Collection Time: 05/04/20 10:53 AM  Result Value Ref Range   Order Confirmation      ORDER PROCESSED BY BLOOD BANK Performed at [facility], [place] [address], [place], [place] [address]   Type and screen Cataract     Status: None   Collection Time: 05/04/20 12:20 PM  Result Value Ref Range   ABO/RH(D) O POS    Antibody Screen NEG    Sample Expiration 05/07/2020,2359    Unit Number  [id number]    Blood Component Type RED CELLS,LR    Unit division 00    Status of Unit ISSUED,FINAL    Transfusion Status OK TO TRANSFUSE    Crossmatch Result      Compatible Performed at [facility], [place] [address], [place], [place] [address]   Urinalysis, Complete w Microscopic  Status: Abnormal   Collection Time: 05/04/20  6:05 PM  Result Value Ref Range   Color, Urine YELLOW YELLOW   APPearance CLEAR CLEAR   Specific Gravity, Urine 1.015 1.005 - 1.030   pH 5.5 5.0 - 8.0   Glucose, UA 100 (A) NEGATIVE mg/dL   Hgb urine dipstick LARGE (A) NEGATIVE   Bilirubin Urine NEGATIVE NEGATIVE   Ketones, ur NEGATIVE NEGATIVE mg/dL   Protein, ur 30 (A) NEGATIVE mg/dL   Nitrite NEGATIVE NEGATIVE   Leukocytes,Ua MODERATE (A) NEGATIVE   Squamous Epithelial / LPF 0-5 0 - 5   WBC, UA >50 0 - 5 WBC/hpf   RBC / HPF 6-10 0 - 5 RBC/hpf   Bacteria, UA FEW (A) NONE SEEN   Mucus PRESENT     Comment: Performed at [facility], [address] [place], [place] [address]  Transfusion reaction     Status: None (Preliminary result)   Collection Time: 05/04/20  6:21 PM  Result Value Ref Range   Post RXN DAT IgG NEG    DAT C3      NEG Performed at [facility], [place] [address] [place], [place] [address]    Path interp tx rxn PENDING   Hemoglobin and hematocrit, blood     Status: Abnormal   Collection Time: 05/04/20 11:22 PM  Result Value Ref Range   Hemoglobin 8.3 (L) 12.0 - 15.0 g/dL   HCT 26.6 (L) 36.0 - 46.0 %    Comment: Performed at [facility], [place] [address] [place], [place] [address]  CBC     Status: Abnormal   Collection Time: 05/05/20  7:17 AM  Result Value Ref Range   WBC 8.1 4.0 - 10.5 K/uL   RBC 2.98 (L) 3.87 - 5.11 MIL/uL   Hemoglobin 8.4 (L) 12.0 - 15.0 g/dL   HCT 26.9 (L) 36.0 - 46.0 %   MCV 90.3 80.0 - 100.0 fL   MCH 28.2 26.0 - 34.0 pg   MCHC 31.2 30.0 - 36.0 g/dL   RDW 16.4 (H) 11.5 - 15.5 %   Platelets 120 (L) 150 - 400 K/uL   nRBC 0.0  0.0 - 0.2 %    Comment: Performed at [facility] [address], [place], [place] [address]  Basic metabolic panel     Status: Abnormal   Collection Time: 05/05/20  7:17 AM  Result Value Ref Range   Sodium 130 (L) 135 - 145 mmol/L   Potassium 4.5 3.5 - 5.1 mmol/L   Chloride 98 98 - 111 mmol/L   CO2 18 (L) 22 - 32 mmol/L   Glucose, Bld 105 (H) 70 - 99 mg/dL    Comment: Glucose reference range applies only to samples taken after fasting for at least 8 hours.   BUN 43 (H) 6 - 20 mg/dL   Creatinine, Ser 2.23 (H) 0.44 - 1.00 mg/dL   Calcium 8.5 (L) 8.9 - 10.3 mg/dL   GFR, Estimated 26 (L) >60 mL/min    Comment: (NOTE) Calculated using the CKD-EPI Creatinine Equation (2021)    Anion gap 14 5 - 15    Comment: Performed at [facility] [address], [place], [place] [address]  Sedimentation rate     Status: Abnormal   Collection Time: 05/05/20 11:24 AM  Result Value Ref Range   Sed Rate 70 (H) 0 - 22 mm/hr    Comment: Performed at [facility] [address],  [place], [place] [address]   No results found.     Medical Problem List and Plan: 1.  Deficits with balance, standing, mobility, self-care secondary to debility.  -patient may shower  -ELOS/Goals: 7-10 days/Supervision  Admit to CIR 2. H/o DVT/ Antithrombotics: -DVT/anticoagulation:  Pharmaceutical: Other (comment)--low dose eliquis due to nose bleeds.   -antiplatelet therapy: N/A 3. Chronic pain Management:  Methadone --2.5 mg bid since radiation completed. Continue Tylenol tid   Monitor with increased exertion 4. Mood: LCSW to follow for evaluation and support.   -antipsychotic agents: N/A 5. Neuropsych: This patient is capable of making decisions on her own behalf. 6. Skin/Wound Care: Local wound care  - sacral decub treated with santyl and wet to dry dressing. Order air mattress  - Right labia-->calcium alginate with dry dressing daily.  7. Fluids/Electrolytes/Nutrition: Monitor I/Os. Continue  supplements--reports that intake improving.   8. 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 Higher dose Daptomycin-->Tedezolid thorough March 16th.   --Flagyl/Cipro thorough 2/26 to complete course of 6 total weeks  --Weekly CK, CBC, BMP 9. Thrombocytopenia: Has improved with platelets 120 (baseline)  CBC ordered for tomorrow  10. Candida esophagitis: On Diflucan thorough 2/12 to compl 11. Gastric ulcer: To continue protonix bid X 6 weeks.   --Continue to monitor H/H with serial checks.   --transfuse prn hgb< 7.0   See #12 12. Acute on chronic anemia:  Transfused 1/21 and 2/1 with improvement.   CBC ordered for tomorrow 13. CKD s/p renal transplant: SCr improving 3.4-->2.23. Hyponatremia stable. Continue to monitor MWF for now.  CMP ordered for tomorrow  Bary Leriche, PA-C    05/05/2020  I have personally performed a face to face diagnostic evaluation, including, but not limited to relevant history and physical exam findings, of this patient and developed relevant assessment and plan.  Additionally, I have reviewed and concur with the physician assistant's documentation above.  Delice Lesch, MD, ABPMR

## 2020-05-05 NOTE — Discharge Summary (Addendum)
Name: Christina Mack MRN: [id number] DOB: 1965-05-20 55 y.o. PCP: Galvin Proffer, MD  Date of Admission: 04/20/2020 11:30 AM Date of Discharge:  05/05/2020 Attending Physician: Earl Lagos, MD  Discharge Diagnosis: 1.  Wound of the right groin 2.  Osteomyelitis of the right pelvic region 3.  Stage IIIC vulva cancer 4.  CKD 4 5.  Thrombocytopenia 6.  Normocytic anemia  Discharge Medications: Allergies as of 05/05/2020       Reactions   Zosyn [piperacillin Sod-tazobactam So] Other (See Comments)   Thrombocytopenia   Codeine    vomiting   Hydromorphone Nausea And Vomiting   Oxycodone-acetaminophen Itching, Nausea Only, Other (See Comments), Nausea And Vomiting   Other Reaction: HA        Medication List     STOP taking these medications    ondansetron 8 MG tablet Commonly known as: ZOFRAN Replaced by: ondansetron 4 MG/2ML Soln injection       TAKE these medications    (feeding supplement) PROSource Plus liquid Take 30 mLs by mouth 2 (two) times daily between meals.   acetaminophen 500 MG tablet Commonly known as: TYLENOL Take 2 tablets (1,000 mg total) by mouth 3 (three) times daily.   apixaban 2.5 MG Tabs tablet Commonly known as: ELIQUIS Take 1 tablet (2.5 mg total) by mouth daily. Start taking on: May 06, 2020 What changed: when to take this   collagenase ointment Commonly known as: SANTYL Apply topically daily. Start taking on: May 06, 2020   DAPTOmycin 500 mg in sodium chloride 0.9 % 100 mL Inject 500 mg into the vein every other day.   ferrous sulfate 325 (65 FE) MG tablet Take 325 mg by mouth 2 (two) times daily with a meal.   fluconazole 100 MG tablet Commonly known as: DIFLUCAN Take 1 tablet (100 mg total) by mouth daily. Start taking on: May 06, 2020   levofloxacin 500 MG tablet Commonly known as: LEVAQUIN Take 1 tablet (500 mg total) by mouth every other day. Start taking on: May 07, 2020 What changed:  when to  take this additional instructions   loperamide 2 MG capsule Commonly known as: IMODIUM Take 2 mg by mouth 4 (four) times daily as needed for diarrhea or loose stools.   magnesium oxide 400 MG tablet Commonly known as: MAG-OX Take 400 mg by mouth 2 (two) times daily.   methadone 5 MG tablet Commonly known as: DOLOPHINE Take 2.5 mg by mouth every 12 (twelve) hours.   metroNIDAZOLE 500 MG tablet Commonly known as: FLAGYL Take 1 tablet (500 mg total) by mouth every 8 (eight) hours. What changed:  when to take this additional instructions   Muscle Rub 10-15 % Crea Apply 1 application topically as needed for muscle pain.   ondansetron 4 MG/2ML Soln injection Commonly known as: ZOFRAN Inject 2 mLs (4 mg total) into the vein once for 1 dose. Replaces: ondansetron 8 MG tablet   ondansetron 4 MG/2ML Soln injection Commonly known as: ZOFRAN Inject 2 mLs (4 mg total) into the vein every 6 (six) hours as needed for nausea or vomiting.   oxymetazoline 0.05 % nasal spray Commonly known as: AFRIN Place 1 spray into both nostrils 2 (two) times daily.   pantoprazole 40 MG tablet Commonly known as: PROTONIX Take 1 tablet (40 mg total) by mouth 2 (two) times daily before a meal.   predniSONE 5 MG tablet Commonly known as: DELTASONE Take 5 mg by mouth daily with breakfast.   sodium  bicarbonate 650 MG tablet Take 2 tablets (1,300 mg total) by mouth 2 (two) times daily.   tacrolimus 0.5 MG capsule Commonly known as: PROGRAF Take 1 capsule (0.5 mg total) by mouth 2 (two) times daily. What changed: when to take this   valACYclovir 500 MG tablet Commonly known as: VALTREX Take 500 mg by mouth 2 (two) times daily.               Discharge Care Instructions  (From admission, onward)           Start     Ordered   05/05/20 0000  Discharge wound care:       Comments: Per wound care team   05/05/20 1254            Disposition and follow-up:   Ms.Jenet S Hight was  discharged from [facility] in Stable condition.  At the hospital follow up visit please address:  1.    Right groin wound Stage IIIc vulvar cancer -IV daptomycin while inpatient and transition to [facility] after discharge to complete 6 weeks regimen. -Resume Levaquin and Flagyl to complete a total of 6 weeks regimen.  ID: Follow-up with antibiotic therapy and clinical improvement GYN/ONC: Follow-up with vulvar cancer  CKD -Continue sodium bicarb and tacrolimus Follow up with nephrology and transplant   2.  Labs / imaging needed at time of follow-up:   3.  Pending labs/ test needing follow-up: NA  Follow-up Appointments:  Follow-up Information     [facility] Follow up.   Specialty: Infectious Diseases Why: 05/25/20 - 10:00 am visit for lab check  06/08/2020 3:00 pm visit with Rexene Alberts, NP for follow up  Contact information: [address] [place], [address] [id number] mc [place] [address] [phone]               HPI She is feeling ok this morning, just trying to sleep. She is not having any pain. She has only noticed a little additional bleeding.  She had stopped eliquis previously before coming in due to nosebleeds. She was told to cut it down to one dose per day if she had additional bleeding. They continued so she ended up stopping it all together. She is agreeable to continuing it once per day.   Hospital Course by problem list: 1.  Right groin wound Patient has history of stage IIIc vulvar cancer status post chemotherapy and radiation in October 2021.  Patient was admitted to [facility] health on 04/04/2020 for purulent discharge from the wound, concerning for soft tissue infection and was started on vancomycin and Zosyn.  Her regimen was transitioned to oral levofloxacin and metronidazole and was completed 3 weeks course end date 1/22/122.  Pelvis MRI at [facility] showed multilocular abscess of the  left pelvis with phlegmon and extensive myositis.  There is also nondisplaced fracture of the inferior right pubic ramus with findings suggestive of osteomyelitis.  Orthopedic, GYN/onc and plastic surgery were consulted and no surgery indicated.  IR drainage of abscess was done a wound culture grew vancomycin-resistant Enterococcus  Faecium. Infectious disease consulted and started patient on IV daptomycin while inpatient and transition to [facility] outpatient for 6-week total.  Levaquin and Flagyl were also restarted to finish a total of 6 weeks of antibiotic.  2.  Stage IIIc vulvar cancer Patient underwent chemotherapy in August which was complicated by DVT and AKI.  She completed her radiation on October 2021.  She is being seen  by [facility] radiation oncology.  Patient expressed that she wants to be seen by a GYN/oncology locally.  The IM team have contacted Dr. Andrey Farmer and they will arrange of outpatient follow-up.  3.  CKD 4 status post renal transplant History of renal transplant due to kidney failure on 2001 and 2014.  She is continue to take her tacrolimus and prednisone while inpatient.   4.  History of DVT Patient developed a right femoral vein DVT in September after chemoradiation for her vulvar cancer.  Per oncology's notes, she was started on Eliquis for 6 months and depends on PET scan results will decide the next step.  While inpatient, patient developed 2 episodes of epistaxis, one lasted 45 minutes with intervention.  Patient states that her oncologist told her to reduce the Eliquis dose to once daily if bleeding.  IM team has spoken to pharmacy about this regimen.  She is concerned for high risk of clotting given history of cancer.  Options include Lovenox 30 mg daily versus Eliquis 2.5 mg once daily. Would recommend coordination with her oncologist to determine best course going forward.   Discharge Exam:   BP 131/72 (BP Location: Right Arm)   Pulse 97   Temp 98.3 F (36.8 C) (Oral)    Resp 17   Ht 5\' 5"  (1.651 m)   Wt 47.6 kg   SpO2 99%   BMI 17.46 kg/m  Discharge exam:   Physical Exam Constitutional:      General: She is not in acute distress. HENT:     Head: Normocephalic.  Eyes:     General:        Right eye: No discharge.        Left eye: No discharge.  Cardiovascular:     Rate and Rhythm: Normal rate and regular rhythm.  Pulmonary:     Effort: Pulmonary effort is normal. No respiratory distress.  Abdominal:     General: Bowel sounds are normal.     Tenderness: There is no abdominal tenderness.  Musculoskeletal:     Right lower leg: No edema.     Left lower leg: No edema.  Skin:    General: Skin is warm.  Neurological:     Mental Status: She is alert.  Psychiatric:        Mood and Affect: Mood normal.     Pertinent Labs, Studies, and Procedures:   CBC Latest Ref Rng & Units 05/05/2020 05/04/2020 05/04/2020  WBC 4.0 - 10.5 K/uL 8.1 - 7.7  Hemoglobin 12.0 - 15.0 g/dL 2.1(H) 8.3(L) 6.9(LL)  Hematocrit 36.0 - 46.0 % 26.9(L) 26.6(L) 21.3(L)  Platelets 150 - 400 K/uL 120(L) - 132(L)   CMP Latest Ref Rng & Units 05/05/2020 05/04/2020 05/03/2020  Glucose 70 - 99 mg/dL 086(V) 784(O) 77  BUN 6 - 20 mg/dL 96(E) 95(M) 84(X)  Creatinine 0.44 - 1.00 mg/dL 3.24(M) 0.10(U) 7.25(D)  Sodium 135 - 145 mmol/L 130(L) 131(L) 132(L)  Potassium 3.5 - 5.1 mmol/L 4.5 5.1 5.6(H)  Chloride 98 - 111 mmol/L 98 101 102  CO2 22 - 32 mmol/L 18(L) 18(L) 17(L)  Calcium 8.9 - 10.3 mg/dL 6.6(Y) 4.0(H) 8.3(L)  Total Protein 6.5 - 8.1 g/dL - - -  Total Bilirubin 0.3 - 1.2 mg/dL - - -  Alkaline Phos 38 - 126 U/L - - -  AST 15 - 41 U/L - - -  ALT 0 - 44 U/L - - -   MRI pelvis IMPRESSION: 1. Large area of ulceration with sinus tract  seen within the right labial fold with diffuse surrounding phlegmon and extensive myositis in the bilateral adductor and anterior muscular compartments of both thighs. 2. There is a multilocular fluid collection extending in the inferior left pelvic  soft tissues surrounding the pubic rami and extending to the anterior upper left thigh, consistent with a multilocular abscess. 3. Nondisplaced fracture of the inferior right pubic rami with findings suggestive osteomyelitis involving the pubic rami and anterior pubic symphysis. 4. Findings suggestive of osteomyelitis involving the anterior left pubic symphysis.  Discharge Instructions: Discharge Instructions     Call MD for:  redness, tenderness, or signs of infection (pain, swelling, redness, odor or green/yellow discharge around incision site)   Complete by: As directed    Call MD for:  temperature >100.4   Complete by: As directed    Diet - low sodium heart healthy   Complete by: As directed    Discharge instructions   Complete by: As directed    Ms. Cintora,  It was a pleasure taking care of you during this admission.  You have an infection of the right labial wound that will be treated with IV antibiotics.  You will be admitted to inpatient rehab for physical therapy.    Please follow-up with gynecology/oncology and infectious disease after discharge.  Take care   Discharge wound care:   Complete by: As directed    Per wound care team   Increase activity slowly   Complete by: As directed        Signed: Doran Stabler, DO 05/05/2020, 12:55 PM   Pager: [phone]

## 2020-05-05 NOTE — H&P (Signed)
Physical Medicine and Rehabilitation Admission H&P    Chief Complaint  Patient presents with  . Debility.     HPI: Christina Mack. 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  History taken from chart review and patient.  CT head done due to fall and showed tiny chronic left thalamic infarct and negative for acute changes.  Patient declined EEG and jerking felt to be due to metabolic derangements.  Nephrology consulted for input on progressive renal failure with failure to thrive and 50 lbs unintentional weight loss. N/V felt to be multifactorial, hemodialysis discussed (patient did not want to start HD) and medication changes recommended with involvement of palliative care to help determine GOC.  Failing kidney felt to be due to sarcopenia and poor nutrition--as renal function stable, nephrology has signed off with recommendations of IVF prn for hydration.    Dr. Pincus Sanes consulted due to ongoing nausea and vomiting (started last fall) and endoscopy done on 04/24/2020 revealing non bleeding gastric ulcer with esophageal plaques consistent with candidiasis. She was started on IV Diflucan X 2 weeks with recommendations for PPI X 6 weeks and cleared to resume [place].  She completed her antibiotic regimen on 01/22 and had recurrent shaking episode on 04/26/2020, questioned to be syncopal.  ID recommended monitoring patient off antibiotics and question shaking episodes as rigors.  MRI pelvis done 04/27/2020 for follow up and showed large area of ulceration with diffuse phlegmon, extensive myositis of bilateral adductor and anterior muscular compartment of both thighs, multilocular abscess in inferior left pelvis surrounding pubic rami  and extending to upper left thigh as well as nondisplaced inferior right pubic fracture with suggestion of osteomyelitis involving pubic rami.   Dr. Delsa Sale consulted and recommended debridement and wound care. Plastics consulted for input and did not feel surgical debridement needed.  She underwent CT guided biopsy of left abdominal wall on 04/22/2020. Hip aspirate grew out enterococcus faecium and daptomycin recommended -->patient agreeable to IV therapy in hospital--daptomycin higher dose and to transition to [facility] on outpatient basis for 6 week antibiotic therapy.  Levaquin and metronidazole added back due to presumptive polymicrobial infection.  She had drop in Hgb to 6.9 on 05/04/2020 and was transfused with one unit PRBC. She did develop nosebleed last evening and eliquis decreased to 2.5 mg daily. She has had issues with significant left hip pain with weakness as well as anxiety/fear of falls affecting mobility and ADLs. Therapy ongoing and currently working on balance and standing--has been able to do some walking. CIR recommended due to functional decline.  Please see preadmission assessment from earlier today as well.    Review of Systems  Constitutional: Positive for malaise/fatigue.  HENT: Negative for hearing loss and tinnitus.   Eyes: Negative for blurred vision and double vision.  Respiratory: Negative for cough and shortness of breath.   Cardiovascular: Negative for chest pain and palpitations.  Gastrointestinal: Negative for abdominal pain, constipation, nausea and vomiting.  Genitourinary: Negative for dysuria and urgency.  Musculoskeletal: Negative for back pain, joint pain and myalgias.  Skin: Negative for itching and rash.  Neurological: Positive for focal weakness and weakness. Negative for dizziness, sensory change and headaches.  Psychiatric/Behavioral: Negative for depression. The patient is not nervous/anxious and does  not have insomnia.   All other systems reviewed  and are negative.    Past Medical History:  Diagnosis Date  . Complication of anesthesia    nausea and vomiting    Past Surgical History:  Procedure Laterality Date  . BIOPSY  04/24/2020   Procedure: BIOPSY;  Surgeon: Doran Stabler, MD;  Location: [facility];  Service: Gastroenterology;;  . ESOPHAGOGASTRODUODENOSCOPY (EGD) WITH PROPOFOL N/A 04/24/2020   Procedure: ESOPHAGOGASTRODUODENOSCOPY (EGD) WITH PROPOFOL;  Surgeon: Doran Stabler, MD;  Location: [facility];  Service: Gastroenterology;  Laterality: N/A;  . IR US GUIDE BX ASP/DRAIN  04/30/2020    Family History  Problem Relation Age of Onset  . Cerebral aneurysm Mother   . AAA (abdominal aortic aneurysm) Brother   . Heart attack Maternal Grandmother   . Brain cancer Maternal Grandfather     Social History:  Married --lives with husband. 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   She reports that she has never smoked. She has never used smokeless tobacco. No history on file for alcohol use and drug use.    Allergies  Allergen Reactions  . Zosyn [Piperacillin Sod-Tazobactam So] Other (See Comments)    Thrombocytopenia  . Codeine     vomiting  . Hydromorphone Nausea And Vomiting  . Oxycodone-Acetaminophen Itching, Nausea Only, Other (See Comments) and Nausea And Vomiting    Other Reaction: HA    Medications Prior to Admission  Medication Sig Dispense Refill  . apixaban (ELIQUIS) 2.5 MG TABS tablet Take 2.5 mg by mouth 2 (two) times daily.    . ferrous sulfate 325 (65 FE) MG tablet Take 325 mg by mouth 2 (two) times daily with a meal.    . levofloxacin (LEVAQUIN) 500 MG tablet Take 500 mg by mouth every other day. For 8 days    . loperamide (IMODIUM) 2 MG capsule Take 2 mg by mouth 4 (four) times daily as needed for diarrhea or loose stools.    . magnesium oxide (MAG-OX) 400 MG tablet Take 400 mg by mouth 2 (two) times daily.    . methadone (DOLOPHINE) 5  MG tablet Take 2.5 mg by mouth every 12 (twelve) hours.    . metroNIDAZOLE (FLAGYL) 500 MG tablet Take 500 mg by mouth 3 (three) times daily. For 7 days    . ondansetron (ZOFRAN) 8 MG tablet Take 8 mg by mouth every 8 (eight) hours as needed for nausea or vomiting.    . predniSONE (DELTASONE) 5 MG tablet Take 5 mg by mouth daily with breakfast.    . tacrolimus (PROGRAF) 0.5 MG capsule Take 0.5 mg by mouth daily.    . valACYclovir (VALTREX) 500 MG tablet Take 500 mg by mouth 2 (two) times daily.      Drug Regimen Review  Drug regimen was reviewed and remains appropriate with no significant issues identified  Home: Home Living Family/patient expects to be discharged to:: Private residence Living Arrangements: Spouse/significant other Available Help at Discharge: Family,Available PRN/intermittently Type of Home: House Home Access: Stairs to enter [employer] of Steps: 7 STE in front no rails, 3STE in the back with rails but cannot reach both Home Layout: Two level,Able to live on main level with bedroom/bathroom Alternate Level Stairs-Number of Steps: flight but can stay on main level Bathroom Shower/Tub: Tub/shower unit,Walk-in shower (downstairs bathroom is a tub) Biochemist, clinical: Standard Home Equipment: Walker - 2 wheels,Cane - single point,Transport chair  Functional History: Prior Function Level of Independence: Independent  Functional Status:  Mobility: Bed Mobility Overal bed mobility: Needs Assistance Bed Mobility: Rolling,Sidelying to Sit Rolling: Supervision Sidelying to sit: Min assist Supine to sit: Min guard Sit to supine: Min assist General bed mobility comments: Log roll toward R EOB with HOB partially elevated. Increased time/effort to bring trunk upright. Needs BLE assist to return to supine. Transfers Overall transfer level: Needs assistance Equipment used: Rolling walker (2 wheeled) Transfers: Sit to/from Stand Sit to Stand: Min assist Stand  pivot transfers: Min assist  Lateral/Scoot Transfers: Min assist General transfer comment: from elevated EOB height to RW. Pt with good sequencing/safety awareness but anxious/fearful of falling Ambulation/Gait Ambulation/Gait assistance: Min guard,+2 safety/equipment Gait Distance (Feet): 40 Feet Assistive device: Rolling walker (2 wheeled) Gait Pattern/deviations: Step-through pattern,Decreased stride length,Trunk flexed,Narrow base of support General Gait Details: Slow and guarded with RW for support. Chair follow utilized for safety as pt fatigues quickly. L hip pain and fatigue limiting distance. Gait velocity: decreased Gait velocity interpretation: <1.31 ft/sec, indicative of household ambulator    ADL: ADL Overall ADL's : Needs assistance/impaired Eating/Feeding: Modified independent,Sitting,Bed level Grooming: Wash/dry face,Wash/dry hands,Min guard,Standing Grooming Details (indicate cue type and reason): intermittent minA for sitting balance when in unsupported sitting Upper Body Bathing: Minimal assistance,Standing Lower Body Bathing: Moderate assistance,Sitting/lateral leans Upper Body Dressing : Min guard,Sitting Lower Body Dressing: Maximal assistance,Sitting/lateral leans Lower Body Dressing Details (indicate cue type and reason): socks Toilet Transfer: Minimal assistance,Stand-pivot,BSC Toilet Transfer Details (indicate cue type and reason): Simulated with stand-pivot transfer to recliner with Min A +2. Patient not heavily reliant on external assist but +2 for patient peace of mind. Toileting- Clothing Manipulation and Hygiene: Maximal assistance Functional mobility during ADLs: Minimal assistance General ADL Comments: Min A +2 and use of RW for transfer to recliner. Patient with increased anxiety with thought of ambulation.  Cognition: Cognition Overall Cognitive Status: Within Functional Limits for tasks assessed Orientation Level: Oriented  X4 Cognition Arousal/Alertness: Awake/alert Behavior During Therapy: Flat affect,WFL for tasks assessed/performed Overall Cognitive Status: Within Functional Limits for tasks assessed General Comments: Fearful of falls, agreeable to standing exercises and pre-gait training at bedside; flat affect   Blood pressure 131/72, pulse 97, temperature 98.3 F (36.8 C), temperature source Oral, resp. rate 17, height 5\' 5"  (1.651 m), weight 47.6 kg, SpO2 99 %. Physical Exam Vitals reviewed.  Constitutional:      Comments: Cachectic  HENT:     Head: Normocephalic and atraumatic.     Right Ear: External ear normal.     Left Ear: External ear normal.     Nose: Nose normal.  Eyes:     General:        Right eye: No discharge.        Left eye: No discharge.     Extraocular Movements: Extraocular movements intact.  Cardiovascular:     Rate and Rhythm: Normal rate and regular rhythm.  Pulmonary:     Effort: Pulmonary effort is normal. No respiratory distress.     Breath sounds: No stridor.  Abdominal:     General: Abdomen is flat. Bowel sounds are normal. There is no distension.  Musculoskeletal:     Cervical back: Normal range of motion and neck supple.     Comments: No edema or tenderness in extremities  Skin:    General: Skin is dry.     Comments: Scattered healing hematomas  Neurological:     Mental Status: She is alert and oriented  to person, place, and time.     Comments: Alert Motor: Bilateral upper extremities: 5/5 proximal distal Right lower extremity: Hip flexion, knee extension 4/5, ankle dorsiflexion 5/5 Left lower extremity: Hip flexion, knee extension 4 -/5, ankle dorsiflexion 5/5 Station intact light touch  Psychiatric:        Mood and Affect: Mood normal.        Behavior: Behavior normal.     Results for orders placed or performed during the hospital encounter of 04/20/20 (from the past 48 hour(s))  Basic metabolic panel     Status: Abnormal   Collection Time:  05/04/20  7:22 AM  Result Value Ref Range   Sodium 131 (L) 135 - 145 mmol/L   Potassium 5.1 3.5 - 5.1 mmol/L   Chloride 101 98 - 111 mmol/L   CO2 18 (L) 22 - 32 mmol/L   Glucose, Bld 128 (H) 70 - 99 mg/dL    Comment: Glucose reference range applies only to samples taken after fasting for at least 8 hours.   BUN 46 (H) 6 - 20 mg/dL   Creatinine, Ser 2.53 (H) 0.44 - 1.00 mg/dL   Calcium 8.4 (L) 8.9 - 10.3 mg/dL   GFR, Estimated 22 (L) >60 mL/min    Comment: (NOTE) Calculated using the CKD-EPI Creatinine Equation (2021)    Anion gap 12 5 - 15    Comment: Performed at [facility] [address] [place], [place] [address]  CBC     Status: Abnormal   Collection Time: 05/04/20  7:22 AM  Result Value Ref Range   WBC 7.7 4.0 - 10.5 K/uL   RBC 2.38 (L) 3.87 - 5.11 MIL/uL   Hemoglobin 6.9 (LL) 12.0 - 15.0 g/dL    Comment: REPEATED TO VERIFY ATC AT 0812 0832 [id number] NO ANSWER TO [id number] THIS CRITICAL RESULT HAS VERIFIED AND BEEN CALLED TO ATC NO ANSWER BY AMANDA LEONARD ON 02 01 2022 AT 0900, AND HAS BEEN READ BACK.     HCT 21.3 (L) 36.0 - 46.0 %   MCV 89.5 80.0 - 100.0 fL   MCH 29.0 26.0 - 34.0 pg   MCHC 32.4 30.0 - 36.0 g/dL   RDW 16.9 (H) 11.5 - 15.5 %   Platelets 132 (L) 150 - 400 K/uL    Comment: Immature Platelet Fraction may be clinically indicated, consider ordering this additional test [id number]    nRBC 0.0 0.0 - 0.2 %    Comment: Performed at [facility] [address], [place], [place] [address]  Prepare RBC (crossmatch)     Status: None   Collection Time: 05/04/20 10:53 AM  Result Value Ref Range   Order Confirmation      ORDER PROCESSED BY BLOOD BANK Performed at [facility], [place] [address], [place], [place] [address]   Type and screen [place]     Status: None   Collection Time: 05/04/20 12:20 PM  Result Value Ref Range   ABO/RH(D) O POS    Antibody Screen NEG    Sample Expiration 05/07/2020,2359    Unit Number  [id number]    Blood Component Type RED CELLS,LR    Unit division 00    Status of Unit ISSUED,FINAL    Transfusion Status OK TO TRANSFUSE    Crossmatch Result      Compatible Performed at [facility], [place] [address] [place],  [address]   Urinalysis, Complete w Microscopic  Status: Abnormal   Collection Time: 05/04/20  6:05 PM  Result Value Ref Range   Color, Urine YELLOW YELLOW   APPearance CLEAR CLEAR   Specific Gravity, Urine 1.015 1.005 - 1.030   pH 5.5 5.0 - 8.0   Glucose, UA 100 (A) NEGATIVE mg/dL   Hgb urine dipstick LARGE (A) NEGATIVE   Bilirubin Urine NEGATIVE NEGATIVE   Ketones, ur NEGATIVE NEGATIVE mg/dL   Protein, ur 30 (A) NEGATIVE mg/dL   Nitrite NEGATIVE NEGATIVE   Leukocytes,Ua MODERATE (A) NEGATIVE   Squamous Epithelial / LPF 0-5 0 - 5   WBC, UA >50 0 - 5 WBC/hpf   RBC / HPF 6-10 0 - 5 RBC/hpf   Bacteria, UA FEW (A) NONE SEEN   Mucus PRESENT     Comment: Performed at [facility], [address] [place], [place] [address]  Transfusion reaction     Status: None (Preliminary result)   Collection Time: 05/04/20  6:21 PM  Result Value Ref Range   Post RXN DAT IgG NEG    DAT C3      NEG Performed at [facility], [place] [address] [place], [place] [address]    Path interp tx rxn PENDING   Hemoglobin and hematocrit, blood     Status: Abnormal   Collection Time: 05/04/20 11:22 PM  Result Value Ref Range   Hemoglobin 8.3 (L) 12.0 - 15.0 g/dL   HCT 26.6 (L) 36.0 - 46.0 %    Comment: Performed at [facility], [place] [address], [place], [place] [address]  CBC     Status: Abnormal   Collection Time: 05/05/20  7:17 AM  Result Value Ref Range   WBC 8.1 4.0 - 10.5 K/uL   RBC 2.98 (L) 3.87 - 5.11 MIL/uL   Hemoglobin 8.4 (L) 12.0 - 15.0 g/dL   HCT 26.9 (L) 36.0 - 46.0 %   MCV 90.3 80.0 - 100.0 fL   MCH 28.2 26.0 - 34.0 pg   MCHC 31.2 30.0 - 36.0 g/dL   RDW 16.4 (H) 11.5 - 15.5 %   Platelets 120 (L) 150 - 400 K/uL   nRBC 0.0  0.0 - 0.2 %    Comment: Performed at [facility] [address] [place], [place] [address]  Basic metabolic panel     Status: Abnormal   Collection Time: 05/05/20  7:17 AM  Result Value Ref Range   Sodium 130 (L) 135 - 145 mmol/L   Potassium 4.5 3.5 - 5.1 mmol/L   Chloride 98 98 - 111 mmol/L   CO2 18 (L) 22 - 32 mmol/L   Glucose, Bld 105 (H) 70 - 99 mg/dL    Comment: Glucose reference range applies only to samples taken after fasting for at least 8 hours.   BUN 43 (H) 6 - 20 mg/dL   Creatinine, Ser 2.23 (H) 0.44 - 1.00 mg/dL   Calcium 8.5 (L) 8.9 - 10.3 mg/dL   GFR, Estimated 26 (L) >60 mL/min    Comment: (NOTE) Calculated using the CKD-EPI Creatinine Equation (2021)    Anion gap 14 5 - 15    Comment: Performed at [facility] [address] [place], [place] [address]  Sedimentation rate     Status: Abnormal   Collection Time: 05/05/20 11:24 AM  Result Value Ref Range   Sed Rate 70 (H) 0 - 22 mm/hr    Comment: Performed at [facility] [address],  [place], [place] [address]   No results found.     Medical Problem List and Plan: 1.  Deficits with balance, standing, mobility, self-care secondary to debility.  -patient may shower  -ELOS/Goals: 7-10 days/Supervision  Admit to CIR 2. H/o DVT/ Antithrombotics: -DVT/anticoagulation:  Pharmaceutical: Other (comment)--low dose eliquis due to nose bleeds.   -antiplatelet therapy: N/A 3. Chronic pain Management:  Methadone --2.5 mg bid since radiation completed. Continue Tylenol tid   Monitor with increased exertion 4. Mood: LCSW to follow for evaluation and support.   -antipsychotic agents: N/A 5. Neuropsych: This patient is capable of making decisions on her own behalf. 6. Skin/Wound Care: Local wound care  - sacral decub treated with santyl and wet to dry dressing. Order air mattress  - Right labia-->calcium alginate with dry dressing daily.  7. Fluids/Electrolytes/Nutrition: Monitor I/Os. Continue  supplements--reports that intake improving.   8. 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 Higher dose Daptomycin-->Tedezolid thorough March 16th.   --Flagyl/Cipro thorough 2/26 to complete course of 6 total weeks  --Weekly CK, CBC, BMP 9. Thrombocytopenia: Has improved with platelets 120 (baseline)  CBC ordered for tomorrow  10. Candida esophagitis: On Diflucan thorough 2/12 to compl 11. Gastric ulcer: To continue protonix bid X 6 weeks.   --Continue to monitor H/H with serial checks.   --transfuse prn hgb< 7.0   See #12 12. Acute on chronic anemia:  Transfused 1/21 and 2/1 with improvement.   CBC ordered for tomorrow 13. CKD s/p renal transplant: SCr improving 3.4-->2.23. Hyponatremia stable. Continue to monitor MWF for now.  CMP ordered for tomorrow  Bary Leriche, PA-C    05/05/2020  I have personally performed a face to face diagnostic evaluation, including, but not limited to relevant history and physical exam findings, of this patient and developed relevant assessment and plan.  Additionally, I have reviewed and concur with the physician assistant's documentation above.  Delice Lesch, MD, ABPMR  The patient's status has not changed. Any changes from the pre-admission screening or documentation from the acute chart are noted above.   Delice Lesch, MD, ABPMR

## 2020-05-06 ENCOUNTER — Telehealth: Payer: Self-pay

## 2020-05-06 DIAGNOSIS — R5381 Other malaise: Principal | ICD-10-CM

## 2020-05-06 DIAGNOSIS — L8990 Pressure ulcer of unspecified site, unspecified stage: Secondary | ICD-10-CM | POA: Insufficient documentation

## 2020-05-06 LAB — CBC WITH DIFFERENTIAL/PLATELET
Abs Immature Granulocytes: 0.07 10*3/uL (ref 0.00–0.07)
Basophils Absolute: 0 10*3/uL (ref 0.0–0.1)
Basophils Relative: 0 %
Eosinophils Absolute: 0 10*3/uL (ref 0.0–0.5)
Eosinophils Relative: 0 %
HCT: 24.3 % — ABNORMAL LOW (ref 36.0–46.0)
Hemoglobin: 7.7 g/dL — ABNORMAL LOW (ref 12.0–15.0)
Immature Granulocytes: 1 %
Lymphocytes Relative: 37 %
Lymphs Abs: 3.3 10*3/uL (ref 0.7–4.0)
MCH: 28.5 pg (ref 26.0–34.0)
MCHC: 31.7 g/dL (ref 30.0–36.0)
MCV: 90 fL (ref 80.0–100.0)
Monocytes Absolute: 0.6 10*3/uL (ref 0.1–1.0)
Monocytes Relative: 7 %
Neutro Abs: 4.9 10*3/uL (ref 1.7–7.7)
Neutrophils Relative %: 55 %
Platelets: 119 10*3/uL — ABNORMAL LOW (ref 150–400)
RBC: 2.7 MIL/uL — ABNORMAL LOW (ref 3.87–5.11)
RDW: 16.7 % — ABNORMAL HIGH (ref 11.5–15.5)
WBC: 8.8 10*3/uL (ref 4.0–10.5)
nRBC: 0 % (ref 0.0–0.2)

## 2020-05-06 LAB — TRANSFUSION REACTION
DAT C3: NEGATIVE
Post RXN DAT IgG: NEGATIVE

## 2020-05-06 LAB — COMPREHENSIVE METABOLIC PANEL
ALT: 12 U/L (ref 0–44)
AST: 17 U/L (ref 15–41)
Albumin: 1.9 g/dL — ABNORMAL LOW (ref 3.5–5.0)
Alkaline Phosphatase: 213 U/L — ABNORMAL HIGH (ref 38–126)
Anion gap: 13 (ref 5–15)
BUN: 40 mg/dL — ABNORMAL HIGH (ref 6–20)
CO2: 20 mmol/L — ABNORMAL LOW (ref 22–32)
Calcium: 8.5 mg/dL — ABNORMAL LOW (ref 8.9–10.3)
Chloride: 99 mmol/L (ref 98–111)
Creatinine, Ser: 2.58 mg/dL — ABNORMAL HIGH (ref 0.44–1.00)
GFR, Estimated: 22 mL/min — ABNORMAL LOW (ref >60)
Glucose, Bld: 79 mg/dL (ref 70–99)
Potassium: 4.8 mmol/L (ref 3.5–5.1)
Sodium: 132 mmol/L — ABNORMAL LOW (ref 135–145)
Total Bilirubin: 0.8 mg/dL (ref 0.3–1.2)
Total Protein: 5.7 g/dL — ABNORMAL LOW (ref 6.5–8.1)

## 2020-05-06 LAB — MISC LABCORP TEST (SEND OUT)
LabCorp test name: 1
Labcorp test code: 96388

## 2020-05-06 LAB — C-REACTIVE PROTEIN: CRP: 4.4 mg/dL — ABNORMAL HIGH (ref <1.0)

## 2020-05-06 LAB — CK: Total CK: 15 U/L — ABNORMAL LOW (ref 38–234)

## 2020-05-06 MED ORDER — BOOST / RESOURCE BREEZE PO LIQD CUSTOM
1.0000 | Freq: Two times a day (BID) | ORAL | Status: DC
  Administered 2020-05-06: 1 via ORAL

## 2020-05-06 NOTE — Progress Notes (Signed)
Patient ID: Christina Mack, female   DOB: [birth year], 55 y.o.   MRN: [id number]  Patient arrived to unit via bed with RN, and husband. Oriented to unit, safety plan, mask policy, rehab schedule, rehab process with verbal understanding. Husband to bring in clothing for patient. Patient resting in bed and requesting to do next dressing change tomorrow morning as she does the dressing change to her groin area herself. This RN said that is was fine and that the night shift RN could take the measurements then and document in the computer.  Dorthula Nettles, RN, BSN, [place] Office [phone] Cell [phone]  **Late Entry**

## 2020-05-06 NOTE — Progress Notes (Signed)
Inpatient Rehabilitation Care Coordinator Assessment and Plan Patient Details  Name: Christina Mack MRN: [id number] Date of Birth: [birth year]  Today's Date: 05/06/2020  Hospital Problems: Principal Problem:   Debility Active Problems:   Pressure injury of skin  Past Medical History:  Past Medical History:  Diagnosis Date  . Complication of anesthesia    nausea and vomiting   Past Surgical History:  Past Surgical History:  Procedure Laterality Date  . BIOPSY  04/24/2020   Procedure: BIOPSY;  Surgeon: Doran Stabler, MD;  Location: [facility];  Service: Gastroenterology;;  . ESOPHAGOGASTRODUODENOSCOPY (EGD) WITH PROPOFOL N/A 04/24/2020   Procedure: ESOPHAGOGASTRODUODENOSCOPY (EGD) WITH PROPOFOL;  Surgeon: Doran Stabler, MD;  Location: [facility];  Service: Gastroenterology;  Laterality: N/A;  . IR US GUIDE BX ASP/DRAIN  04/30/2020   Social History:  reports that she has never smoked. She has never used smokeless tobacco. No history on file for alcohol use and drug use.  Family / Support Systems Marital Status: Married Patient Roles: Spouse Spouse/Significant Other: Melinda Crutch Children: Olivia Mackie (daughter) Anticipated Caregiver: Leota Maka Ability/Limitations of Caregiver: Works during the day, aunt will be with patient during the day Leonie Man: [phone]) Caregiver Availability: 24/7  Social History Preferred language: English Religion: None Read: Yes Write: Yes Legal History/Current Legal Issues: n/a Guardian/Conservator: n/a   Abuse/Neglect Abuse/Neglect Assessment Can Be Completed: Yes Physical Abuse: Denies Verbal Abuse: Denies Sexual Abuse: Denies Exploitation of patient/patient's resources: Denies Self-Neglect: Denies  Emotional Status Pt's affect, behavior and adjustment status: no Recent Psychosocial Issues: no Psychiatric History: no Substance Abuse History: no  Patient / Family Perceptions, Expectations & Goals Pt/Family understanding of  illness & functional limitations: yes Premorbid pt/family roles/activities: Active and independent in the community Anticipated changes in roles/activities/participation: 24/7 Supervision, some assistance Pt/family expectations/goals: Warden/ranger Agencies: None Premorbid Home Care/DME Agencies: Other (Comment) Librarian, academic, [place], Systems analyst) Transportation available at discharge: Family able to transport  Discharge Planning Living Arrangements: Spouse/significant other Support Systems: Spouse/significant other,Children Type of Residence: Private residence (2 level home (able to stay on main), 7 steps to enter front door (no railings), 3 steps to enter back W/ railings unable to reach both) Administrator, sports: Multimedia programmer (specify) ([place] Management consultant)) Financial Screen Referred: No Living Expenses: Lives with family Money Management: Patient,Spouse Does the patient have any problems obtaining your medications?: No Home Management: Independent Care Coordinator Barriers to Discharge: Wound Care,Lack of/limited family support,Decreased caregiver support,Home environment access/layout,IV antibiotics,Neurogenic Bowel & Bladder Care Coordinator Anticipated Follow Up Needs: HH/OP Expected length of stay: 7-10 Days  Clinical Impression Covering for primary SW, Becky. SW introduced self, explained role and addressed questions and concerns. Patient very pleasant, therapy coming to see patient.Patient reports discharging home with spouse. While spouse works patient aunt is available 24/7 to supervision and assist patient in home. SW will continue to follow up with questions and concerns.   Dyanne Iha 05/06/2020, 1:39 PM

## 2020-05-06 NOTE — Progress Notes (Signed)
Inpatient Rehabilitation Center Individual Statement of Services  Patient Name:  Christina Mack  Date:  05/06/2020  Welcome to the [facility].  Our goal is to provide you with an individualized program based on your diagnosis and situation, designed to meet your specific needs.  With this comprehensive rehabilitation program, you will be expected to participate in at least 3 hours of rehabilitation therapies Monday-Friday, with modified therapy programming on the weekends.  Your rehabilitation program will include the following services:  Physical Therapy (PT), Occupational Therapy (OT), Speech Therapy (ST), 24 hour per day rehabilitation nursing, Therapeutic Recreaction (TR), Neuropsychology, Care Coordinator, Rehabilitation Medicine, Nutrition Services, Pharmacy Services and Other  Weekly team conferences will be held on Tuesday to discuss your progress.  Your Inpatient Rehabilitation Care Coordinator will talk with you frequently to get your input and to update you on team discussions.  Team conferences with you and your family in attendance may also be held.  Expected length of stay: 7-10 Days  Overall anticipated outcome: Supervision  Depending on your progress and recovery, your program may change. Your Inpatient Rehabilitation Care Coordinator will coordinate services and will keep you informed of any changes. Your Inpatient Rehabilitation Care Coordinator's name and contact numbers are listed  below.  The following services may also be recommended but are not provided by the [facility]:    [place] will be made to provide these services after discharge if needed.  Arrangements include referral to agencies that provide these services.  Your insurance has been verified to be:  [employer] Your primary doctor is:  Donnetta Hutching, MD  Pertinent information will be shared  with your doctor and your insurance company.  Inpatient Rehabilitation Care Coordinator:  Ovidio Kin, [place] or Emilia Beck  Information discussed with and copy given to patient by: Dyanne Iha, 05/06/2020, 10:27 AM
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
## 2020-05-06 NOTE — Progress Notes (Signed)
Discussed meds with patient-she reports that was not on Valtrex (and not familiar with this med) PTA and not on iron on acute. Iron level low normal with elevated ferritin--likely has malabsorption with poor intake. Also iron contraindicated with infection--will hold off on it. Will add Valtrex as had two denuded blisters which are very painful and question herpetic in nature (reports transplant donor had CMV)    Yellow eschar on coccyx. Area wet and macerated due to dependent drainage from vulva drainage. Discussed need to change dressings frequently to prevent further breakdown. Air mattress ordered but not delivered yet-->nurse to pursue.   Wound below: Vulvar tissues has been eaten away due to cancer --patient very anxious and fearful to let anyone touch it of examine it.  Approximate measurement -->8 cm in length and 6 cm deep (not probed fully due to patient's fear/anxiety). She had a steady stream of thin yellow drainage at the bottom which is contributing to maceration on the left-->perinuem-->lower buttocks/sacrum. She has been doing her own dressing changes but was willing to let me and nurse exam/change dressing. We discussed packing wound with kerlex but she decline--would just like to use current dressing --order state scalcium alginate but has been using Aquacell silver dressing cut in half and covered by ABD pad. She wants to continue current care--Will continue current plan till we are able to build rapport and trust.

## 2020-05-06 NOTE — Evaluation (Signed)
Physical Therapy Assessment and Plan  Patient Details  Name: Christina Mack MRN: [id number] Date of Birth: [birth year]  PT Diagnosis: Difficulty walking, Low back pain, Muscle weakness and Pain in perineum area Rehab Potential: Fair ELOS: 10-14 days   Today's Date: 05/06/2020 PT Individual Time: 1045-1150 PT Individual Time Calculation (min): 65 min    Hospital Problem: Principal Problem:   Debility Active Problems:   Pressure injury of skin   Past Medical History:  Past Medical History:  Diagnosis Date  . Complication of anesthesia    nausea and vomiting   Past Surgical History:  Past Surgical History:  Procedure Laterality Date  . BIOPSY  04/24/2020   Procedure: BIOPSY;  Surgeon: Doran Stabler, MD;  Location: [facility];  Service: Gastroenterology;;  . ESOPHAGOGASTRODUODENOSCOPY (EGD) WITH PROPOFOL N/A 04/24/2020   Procedure: ESOPHAGOGASTRODUODENOSCOPY (EGD) WITH PROPOFOL;  Surgeon: Doran Stabler, MD;  Location: [facility];  Service: Gastroenterology;  Laterality: N/A;  . IR US GUIDE BX ASP/DRAIN  04/30/2020    Assessment & Plan Clinical Impression: Patient is a 55 y.o. 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  History taken from chart review and patient.  CT head done due to fall and showed tiny chronic left thalamic infarct and negative for acute changes.  Patient declined EEG and jerking felt to be due to metabolic derangements.  Nephrology consulted for input on progressive renal failure with failure to thrive and 50 lbs unintentional weight loss. N/V felt to be multifactorial, hemodialysis discussed (patient did not want to start HD) and medication changes recommended with involvement of  palliative care to help determine GOC.  Failing kidney felt to be due to sarcopenia and poor nutrition--as renal function stable, nephrology has signed off with recommendations of IVF prn for hydration.    Dr. Pincus Sanes consulted due to ongoing nausea and vomiting (started last fall) and endoscopy done on 04/24/2020 revealing non bleeding gastric ulcer with esophageal plaques consistent with candidiasis. She was started on IV Diflucan X 2 weeks with recommendations for PPI X 6 weeks and cleared to resume [place].  She completed her antibiotic regimen on 01/22 and had recurrent shaking episode on 04/26/2020, questioned to be syncopal.  ID recommended monitoring patient off antibiotics and question shaking episodes as rigors.  MRI pelvis done 04/27/2020 for follow up and showed large area of ulceration with diffuse phlegmon, extensive myositis of bilateral adductor and anterior muscular compartment of both thighs, multilocular abscess in inferior left pelvis surrounding pubic rami and extending to upper left thigh as well as nondisplaced inferior right pubic fracture with suggestion of osteomyelitis involving pubic rami.   Dr. Delsa Sale consulted and recommended debridement and wound care. Plastics consulted for input and did not feel surgical debridement needed.  She underwent CT guided biopsy of left abdominal wall on 04/22/2020. Hip aspirate grew out enterococcus faecium and daptomycin recommended -->patient agreeable to IV therapy in hospital--daptomycin higher dose and to transition to [facility] on outpatient basis for 6 week antibiotic therapy.  Levaquin and metronidazole added back due to presumptive polymicrobial infection.  She had drop in Hgb to 6.9 on 05/04/2020 and was transfused with one unit PRBC. She did develop nosebleed last evening and eliquis decreased to 2.5 mg daily. She has had issues  with significant left hip pain with weakness as well as anxiety/fear of falls affecting mobility and ADLs. Therapy  ongoing and currently working on balance and standing--has been able to do some walking. CIR recommended due to functional decline.  Please see preadmission assessment from earlier today as well. Patient transferred to CIR on 05/05/2020 .   Patient currently requires min with mobility secondary to muscle weakness, decreased cardiorespiratoy endurance and decreased sitting balance, decreased standing balance and decreased balance strategies.  Prior to hospitalization, patient was supervision with mobility and lived with Spouse in a House home.  Home access is 7 STE in front no rails, 3STE in the back with rails but cannot reach bothStairs to enter.  Patient will benefit from skilled PT intervention to maximize safe functional mobility, minimize fall risk and decrease caregiver burden for planned discharge home with 24 hour assist.  Anticipate patient will benefit from follow up [facility] at discharge.  PT - End of Session Activity Tolerance: Tolerates 30+ min activity with multiple rests Endurance Deficit: Yes PT Assessment Rehab Potential (ACUTE/IP ONLY): Fair PT Barriers to Discharge: [place] home environment;Home environment access/layout;Wound Care;Nutrition means PT Patient demonstrates impairments in the following area(s): Balance;Skin Integrity;Endurance;Pain;Nutrition PT Transfers Functional Problem(s): Bed Mobility;Bed to Chair;Car PT Locomotion Functional Problem(s): Ambulation;Stairs;Wheelchair Mobility PT Plan PT Intensity: Minimum of 1-2 x/day ,45 to 90 minutes PT Frequency: 5 out of 7 days PT Duration Estimated Length of Stay: 10-14 days PT Treatment/Interventions: Ambulation/gait training;Community reintegration;DME/adaptive equipment instruction;Neuromuscular re-education;Psychosocial support;Stair training;Wheelchair propulsion/positioning;UE/LE Strength taining/ROM;UE/LE Coordination activities;Therapeutic Activities;Skin care/wound management;Pain management;Discharge  planning;Balance/vestibular training;Cognitive remediation/compensation;Disease management/prevention;Functional mobility training;Patient/family education;Splinting/orthotics;Therapeutic Exercise;Visual/perceptual remediation/compensation PT Transfers Anticipated Outcome(s): supervision wiht LRAD PT Locomotion Anticipated Outcome(s): CGA with LRAD PT Recommendation Recommendations for Other Services: Therapeutic Recreation consult Therapeutic Recreation Interventions: Stress management Follow Up Recommendations: Home health PT Patient destination: Home Equipment Recommended: To be determined Equipment Details: only has SPC with quad cane attachment   PT Evaluation Precautions/Restrictions Precautions Precautions: Fall Precaution Comments: recent falls Restrictions Weight Bearing Restrictions: No General   Vital SignsTherapy Vitals Temp: 99 F (37.2 C) Temp Source: Oral Pulse Rate: (!) 104 Resp: 16 BP: 118/87 Patient Position (if appropriate): Lying Oxygen Therapy SpO2: 98 % O2 Device: Room Air Pain Pain Assessment Pain Scale: 0-10 Pain Score: 0-No pain Home Living/Prior Functioning Home Living Available Help at Discharge: Family;Available PRN/intermittently (pt's aunt is available to assist while husband is not home) Type of Home: House Home Access: Stairs to enter [employer] of Steps: 7 STE in front no rails, 3STE in the back with rails but cannot reach both Home Layout: Two level;Able to live on main level with bedroom/bathroom Alternate Level Stairs-Rails: Right Bathroom Shower/Tub: Chiropodist: Standard Bathroom Accessibility: Yes  Lives With: Spouse Prior Function Level of Independence: Independent with basic ADLs;Independent with homemaking with wheelchair;Independent with transfers;Independent with gait;Requires assistive device for independence (pt reported one week prior to hospitalization she was I with quad cane)  Able to  Take Stairs?: No Driving: No Vision/Perception  Perception Perception: Within Functional Limits Praxis Praxis: Intact  Cognition Overall Cognitive Status: Within Functional Limits for tasks assessed Arousal/Alertness: Awake/alert Orientation Level: Oriented X4 Attention: Focused;Sustained Focused Attention: Appears intact Sustained Attention: Appears intact Executive Function: Reasoning;Sequencing Reasoning: Appears intact Sequencing: Appears intact Safety/Judgment: Appears intact Sensation Sensation Light Touch: Appears Intact Motor  Motor Motor: Within Functional Limits   Trunk/Postural Assessment  Cervical Assessment Cervical Assessment: Within Functional Limits Thoracic Assessment Thoracic Assessment:  (mildly kyphotic) Lumbar Assessment Lumbar Assessment:  (varied  however mild posterior tilt)  Balance Balance Balance Assessed: Yes Static Sitting Balance Static Sitting - Balance Support: No upper extremity supported;Feet supported Static Sitting - Level of Assistance: 5: Stand by assistance Dynamic Sitting Balance Dynamic Sitting - Balance Support: Feet supported;No upper extremity supported Dynamic Sitting - Level of Assistance: 4: Min Insurance risk surveyor Standing - Balance Support: Bilateral upper extremity supported Static Standing - Level of Assistance: 4: Min assist Dynamic Standing Balance Dynamic Standing - Balance Support: Bilateral upper extremity supported Dynamic Standing - Level of Assistance: 4: Min assist Dynamic Standing - Balance Activities: Lateral lean/weight shifting Extremity Assessment      RLE Assessment RLE Assessment: Exceptions to [facility] General Strength Comments: grossly 3/5 LLE Assessment LLE Assessment: Exceptions to [facility] General Strength Comments: grossly 3/5  Care Tool Care Tool Bed Mobility Roll left and right activity   Roll left and right assist level: Contact Guard/Touching assist    Sit to lying  activity   Sit to lying assist level: Minimal Assistance - Patient > 75%    Lying to sitting edge of bed activity   Lying to sitting edge of bed assist level: Moderate Assistance - Patient 50 - 74%     Care Tool Transfers Sit to stand transfer   Sit to stand assist level: Minimal Assistance - Patient > 75%    Chair/bed transfer   Chair/bed transfer assist level: Minimal Assistance - Patient > 75%     Toilet transfer   Assist Level: Minimal Assistance - Patient > 75%    Car transfer Car transfer activity did not occur: Safety/medical concerns        Care Tool Locomotion Ambulation   Assist level: Minimal Assistance - Patient > 75% Assistive device: Walker-rolling Max distance: 25  Walk 10 feet activity   Assist level: Minimal Assistance - Patient > 75% Assistive device: Walker-rolling   Walk 50 feet with 2 turns activity   Assist level: Minimal Assistance - Patient > 75% Assistive device: Walker-rolling  Walk 150 feet activity Walk 150 feet activity did not occur: Safety/medical concerns      Walk 10 feet on uneven surfaces activity        Stairs Stair activity did not occur: Safety/medical concerns        Walk up/down 1 step activity Walk up/down 1 step or curb (drop down) activity did not occur: Safety/medical concerns     Walk up/down 4 steps activity did not occuR: Safety/medical concerns  Walk up/down 4 steps activity      Walk up/down 12 steps activity Walk up/down 12 steps activity did not occur: Safety/medical concerns      Pick up small objects from floor Pick up small object from the floor (from standing position) activity did not occur: Safety/medical concerns      Wheelchair Will patient use wheelchair at discharge?: Yes (pt may benefit from from [facility] for home pending progression, unable to assess on eval 2/2 pt's inability to tolerate sitting in WC 2/2 pain)   Wheelchair activity did not occur: Safety/medical concerns      Wheel 50 feet with 2  turns activity Wheelchair 50 feet with 2 turns activity did not occur: Safety/medical concerns    Wheel 150 feet activity Wheelchair 150 feet activity did not occur: Safety/medical concerns      Refer to Care Plan for Long Term Goals  SHORT TERM GOAL WEEK 1 PT Short Term Goal 1 (Week 1): pt to demonstrate supine<>sit CGA PT Short Term  Goal 2 (Week 1): pt to demonstrate functional transfers CGA PT Short Term Goal 3 (Week 1): pt to demonstrate ambulation 11' CGA with LRAD PT Short Term Goal 4 (Week 1): pt to tolerate OOB positioning for 45 mins  Recommendations for other services: Therapeutic Recreation  Stress management  Skilled Therapeutic Intervention  Evaluation completed (see details above and below) with education on PT POC and goals and individual treatment initiated with focus on  Bed mobility, transfer training, gait training, safety awareness, call light use, standing balance. pt received in bed and agreeable to therapy reported no pain at start of session while pt sidelying in bed, once mobility initiated pt reported increased pain in perineum area with all sitting to 7/10, static sitting somewhat decreased but pt continued to report great discomfort. Pt directed in supine>sit mod A 2/2 pain with transition. Pt required max A for donning mesh underwear for improved comfort with mobility and dressing support. Pt directed in Sit to stand to Rolling walker min A for standard bed height. Pt directed in gait 20' in room as she declined to leave the room for her session at this time. Gait grossly min A with Rolling walker, pt expressed fear of falling and very cautious with stepping, very slow cadence and poor stride length noted. Pt agreeable to attempting to sit in recliner for change of position but requested to return to bed at end of session. Pt directed in sitting in recliner min A; sat for therapeutic exercises 2.5# UE for bicep curls 2x10 each; 1# BLE for marching and LAQ x10, unable to  complete with 2.5# ankle weight. Pt reported inability to attempt 2nd set of BLE exercises 2/2 greatly increased discomfort in sitting despite repositioning, pillows for relief. Pt directed in Sit to stand to Rolling walker min A and static standing assisting in relief of pain for 2 mins min A. Pt then directed in gait around room 20' min A, pt requested to return to bed, transferred to EOB min A and min A for sit>supine for LE management. Pt positioned for comfort and skin integrity at end of session. Pt left All needs in reach and in good condition. Call light in hand.      Mobility   Locomotion  Gait Ambulation: Yes Gait Assistance: Minimal Assistance - Patient > 75% Gait Distance (Feet): 25 Feet Assistive device: Rolling walker Gait Assistance Details: Verbal cues for technique;Verbal cues for precautions/safety;Verbal cues for gait pattern;Verbal cues for safe use of DME/AE Gait Gait: Yes Gait Pattern: Trunk flexed;Step-through pattern;Decreased stride length Gait velocity: decreased Stairs / Additional Locomotion Stairs: No Wheelchair Mobility Wheelchair Mobility: No (pt may benefit from [facility] assessment however unable to tolerate sitting in WC long enough to formally assess 2/2 pain)   Discharge Criteria: Patient will be discharged from PT if patient refuses treatment 3 consecutive times without medical reason, if treatment goals not met, if there is a change in medical status, if patient makes no progress towards goals or if patient is discharged from hospital.  The above assessment, treatment plan, treatment alternatives and goals were discussed and mutually agreed upon: by patient  Junie Panning 05/06/2020, 2:49 PM

## 2020-05-06 NOTE — Evaluation (Signed)
Occupational Therapy Assessment and Plan  Patient Details  Name: Christina Mack MRN: [id number] Date of Birth: 1965-04-15  OT Diagnosis: acute pain and muscle weakness (generalized) Rehab Potential:   ELOS: 10-14 days   Today's Date: 05/06/2020 OT Individual Time: 1300-1405 OT Individual Time Calculation (min): 65 min     Hospital Problem: Principal Problem:   Debility Active Problems:   Pressure injury of skin   Past Medical History:  Past Medical History:  Diagnosis Date  . Complication of anesthesia    nausea and vomiting   Past Surgical History:  Past Surgical History:  Procedure Laterality Date  . BIOPSY  04/24/2020   Procedure: BIOPSY;  Surgeon: Doran Stabler, MD;  Location: [facility];  Service: Gastroenterology;;  . ESOPHAGOGASTRODUODENOSCOPY (EGD) WITH PROPOFOL N/A 04/24/2020   Procedure: ESOPHAGOGASTRODUODENOSCOPY (EGD) WITH PROPOFOL;  Surgeon: Doran Stabler, MD;  Location: [facility];  Service: Gastroenterology;  Laterality: N/A;  . IR US GUIDE BX ASP/DRAIN  04/30/2020    Assessment & Plan Clinical Impression: Patient is a 55 y.o. 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  History taken from chart review and patient.  CT head done due to fall and showed tiny chronic left thalamic infarct and negative for acute changes.  Patient declined EEG and jerking felt to be due to metabolic derangements.  Nephrology consulted for input on progressive renal failure with failure to thrive and 50 lbs unintentional weight loss. N/V felt to be multifactorial, hemodialysis discussed (patient did not want to start HD) and medication changes recommended with involvement of palliative care to help determine  GOC.  Failing kidney felt to be due to sarcopenia and poor nutrition--as renal function stable, nephrology has signed off with recommendations of IVF prn for hydration.    Dr. Pincus Sanes consulted due to ongoing nausea and vomiting (started last fall) and endoscopy done on 04/24/2020 revealing non bleeding gastric ulcer with esophageal plaques consistent with candidiasis. She was started on IV Diflucan X 2 weeks with recommendations for PPI X 6 weeks and cleared to resume [place].  She completed her antibiotic regimen on 01/22 and had recurrent shaking episode on 04/26/2020, questioned to be syncopal.  ID recommended monitoring patient off antibiotics and question shaking episodes as rigors.  MRI pelvis done 04/27/2020 for follow up and showed large area of ulceration with diffuse phlegmon, extensive myositis of bilateral adductor and anterior muscular compartment of both thighs, multilocular abscess in inferior left pelvis surrounding pubic rami and extending to upper left thigh as well as nondisplaced inferior right pubic fracture with suggestion of osteomyelitis involving pubic rami.   Dr. Delsa Sale consulted and recommended debridement and wound care. Plastics consulted for input and did not feel surgical debridement needed.  She underwent CT guided biopsy of left abdominal wall on 04/22/2020. Hip aspirate grew out enterococcus faecium and daptomycin recommended -->patient agreeable to IV therapy in hospital--daptomycin higher dose and to transition to [facility] on outpatient basis for 6 week antibiotic therapy.  Levaquin and metronidazole added back due to presumptive polymicrobial infection.  She had drop in Hgb to 6.9 on 05/04/2020 and was transfused with one unit PRBC. She did develop nosebleed last evening and eliquis decreased to 2.5 mg daily. She has had issues with significant left hip  pain with weakness as well as anxiety/fear of falls affecting mobility and ADLs. Therapy ongoing and currently working on  balance and standing--has been able to do some walking.   Patient transferred to CIR on 05/05/2020 .    Patient currently requires mod with basic self-care skills and IADL secondary to muscle weakness and decreased sitting balance, decreased standing balance, decreased postural control and wounds/pain.  Prior to hospitalization, patient could complete ADL with modified independent .  Patient will benefit from skilled intervention to decrease level of assist with basic self-care skills and increase independence with basic self-care skills prior to discharge home with care partner.  Anticipate patient will require intermittent supervision and follow up home health.  OT - End of Session Activity Tolerance: Tolerates 10 - 20 min activity with multiple rests Endurance Deficit: Yes Endurance Deficit Description: c/o nausea, fatigue with change of position and adl tasks OT Assessment OT Patient demonstrates impairments in the following area(s): Endurance;Pain;Skin Integrity OT Basic ADL's Functional Problem(s): Grooming;Bathing;Dressing;Toileting OT Transfers Functional Problem(s): Toilet OT Plan OT Intensity: Minimum of 1-2 x/day, 45 to 90 minutes OT Frequency: 5 out of 7 days OT Duration/Estimated Length of Stay: 10-14 days OT Treatment/Interventions: Self Care/advanced ADL retraining;Therapeutic Exercise;DME/adaptive equipment instruction;Pain management;Skin care/wound managment;Patient/family education;Discharge planning;Functional mobility training;Therapeutic Activities OT Self Feeding Anticipated Outcome(s): independent OT Basic Self-Care Anticipated Outcome(s): set up/mod I with assistive devices OT Toileting Anticipated Outcome(s): CS OT Bathroom Transfers Anticipated Outcome(s): CS OT Recommendation Patient destination: Home Follow Up Recommendations: Home health OT Equipment Recommended: To be determined Equipment Details: patient states that she owns commode and tub seat   OT  Evaluation Precautions/Restrictions  Precautions Precautions: Fall Precaution Comments: recent falls, wounds Restrictions Weight Bearing Restrictions: No General PT Missed Treatment Reason: Patient fatigue Vital Signs Therapy Vitals Temp: 99 F (37.2 C) Temp Source: Oral Pulse Rate: (!) 104 Resp: 16 BP: 118/87 Patient Position (if appropriate): Lying Oxygen Therapy SpO2: 98 % O2 Device: Room Air Pain Pain Assessment Pain Scale: 0-10 Pain Score: 9  Pain Type: Chronic pain Pain Location: Groin Pain Descriptors / Indicators: Tender Pain Intervention(s): Repositioned Home Living/Prior Functioning Home Living Family/patient expects to be discharged to:: Private residence Living Arrangements: Spouse/significant other Available Help at Discharge: Family,Available PRN/intermittently Type of Home: House Home Access: Stairs to enter [employer] of Steps: 7 STE in front no rails, 3STE in the back with rails but cannot reach both Home Layout: Two level,Able to live on main level with bedroom/bathroom Alternate Level Stairs-Number of Steps: flight but can stay on main level Alternate Level Stairs-Rails: Right Bathroom Shower/Tub: Chiropodist: Standard Bathroom Accessibility: Yes  Lives With: Spouse Prior Function Level of Independence: Independent with basic ADLs,Independent with homemaking with wheelchair,Independent with transfers,Independent with gait,Requires assistive device for independence  Able to Take Stairs?: No Driving: No Vision Baseline Vision/History: Wears glasses Wears Glasses: At all times Patient Visual Report: No change from baseline Vision Assessment?: No apparent visual deficits Perception  Perception: Within Functional Limits Praxis Praxis: Intact Cognition Overall Cognitive Status: Within Functional Limits for tasks assessed Arousal/Alertness: Awake/alert Orientation Level: Person;Place;Situation Person:  Oriented Place: Oriented Situation: Oriented Year: 2022 Month: February Day of Week: Correct Memory: Appears intact Immediate Memory Recall: Sock;Blue;Bed Memory Recall Sock: Without Cue Memory Recall Blue: Without Cue Memory Recall Bed: Without Cue Attention: Focused;Sustained Focused Attention: Appears intact Sustained Attention: Appears intact Awareness: Appears intact Problem Solving: Appears intact Executive Function: Reasoning;Sequencing Reasoning: Appears intact Sequencing: Appears intact Safety/Judgment: Appears intact Sensation Sensation Light Touch:  Appears Intact Coordination Fine Motor Movements are Fluid and Coordinated: Yes Finger Nose Finger Test: [place] Motor  Motor Motor: Within Functional Limits  Trunk/Postural Assessment  Cervical Assessment Cervical Assessment: Within Functional Limits Thoracic Assessment Thoracic Assessment:  (mildly kyphotic) Lumbar Assessment Lumbar Assessment:  (varied however mild posterior tilt) Postural Control Postural Control: Within Functional Limits  Balance Balance Balance Assessed: Yes Static Sitting Balance Static Sitting - Balance Support: No upper extremity supported;Feet supported Static Sitting - Level of Assistance: 5: Stand by assistance Dynamic Sitting Balance Dynamic Sitting - Balance Support: Feet supported;No upper extremity supported Dynamic Sitting - Level of Assistance: 5: Stand by assistance Static Standing Balance Static Standing - Balance Support: Bilateral upper extremity supported Static Standing - Level of Assistance: 4: Min assist Dynamic Standing Balance Dynamic Standing - Balance Support: Bilateral upper extremity supported Dynamic Standing - Level of Assistance: 4: Min assist Dynamic Standing - Balance Activities: Lateral lean/weight shifting Extremity/Trunk Assessment RUE Assessment RUE Assessment: Within Functional Limits LUE Assessment LUE Assessment: Within Functional Limits  Care  Tool Care Tool Self Care Eating   Eating Assist Level: Set up assist    Oral Care    Oral Care Assist Level: Supervision/Verbal cueing    Bathing   Body parts bathed by patient: Right arm;Left arm;Chest;Abdomen;Front perineal area;Buttocks;Right upper leg;Left upper leg;Face Body parts bathed by helper: Right lower leg;Left lower leg;Buttocks   Assist Level: Moderate Assistance - Patient 50 - 74%    Upper Body Dressing(including orthotics)   What is the patient wearing?: Pull over shirt   Assist Level: Minimal Assistance - Patient > 75%    Lower Body Dressing (excluding footwear)   What is the patient wearing?: Pants;Underwear/pull up Assist for lower body dressing: Maximal Assistance - Patient 25 - 49%    Putting on/Taking off footwear   What is the patient wearing?: Non-skid slipper socks Assist for footwear: Dependent - Patient 0%       Care Tool Toileting Toileting activity   Assist for toileting: Moderate Assistance - Patient 50 - 74%     Care Tool Bed Mobility Roll left and right activity   Roll left and right assist level: Contact Guard/Touching assist    Sit to lying activity   Sit to lying assist level: Minimal Assistance - Patient > 75%    Lying to sitting edge of bed activity   Lying to sitting edge of bed assist level: Moderate Assistance - Patient 50 - 74%     Care Tool Transfers Sit to stand transfer   Sit to stand assist level: Minimal Assistance - Patient > 75%    Chair/bed transfer   Chair/bed transfer assist level: Minimal Assistance - Patient > 75%     Toilet transfer   Assist Level: Minimal Assistance - Patient > 75%     Care Tool Cognition Expression of Ideas and Wants Expression of Ideas and Wants: Without difficulty (complex and basic) - expresses complex messages without difficulty and with speech that is clear and easy to understand   Understanding Verbal and Non-Verbal Content Understanding Verbal and Non-Verbal Content: Understands  (complex and basic) - clear comprehension without cues or repetitions   Memory/Recall Ability *first 3 days only Memory/Recall Ability *first 3 days only: Current season;Location of own room;Staff names and faces;That he or she is in a hospital/hospital unit    Refer to Care Plan for Long Term Goals  SHORT TERM GOAL WEEK 1 OT Short Term Goal 1 (Week 1): patient will complete bed mobility  with CS using assistive devices as needed OT Short Term Goal 2 (Week 1): patient will complete SPT with RW CS level OT Short Term Goal 3 (Week 1): patient will complete lower body adl min A with assistive devices  Recommendations for other services: None    Skilled Therapeutic Intervention  Patient in bed, alert and aware of her needs.  She notes ongoing pain in groin and sacrum related to wounds and location of CA.  Nursing aware and provided meds during session.  Reviewed role of OT, plan of care - she demonstrates good understanding.  Evaluation completed as documented above.  She presents with general deconditioning, impaired balance and is limited by pain and wounds impacting all aspects of self care and mobility at this time.  She is an excellent candidate for IP rehab to maximize mobility and strength and provide options for seating and self care to facilitate ongoing healing and safe return to home.   She participated in adl and mobility training this session - self care completed seated edge of bed and in side lying position due to wounds/difficulty sitting.  She will benefit from exploring assistive devices and DME options.  Sit to stand from bed surface and SPT with RW min A but she is unable to tolerate standard w/c with ROHO or standard commode at this time.  Assisted with using bed pan - overall min A to remove.  Nursing assisted with dressing change at end of session bed level - patient with call bell and tray table in reach.     ADL ADL Eating: Set up Where Assessed-Eating: Bed level Grooming:  Setup Where Assessed-Grooming: Bed level Upper Body Bathing: Setup Where Assessed-Upper Body Bathing: Edge of bed Lower Body Bathing: Maximal assistance Where Assessed-Lower Body Bathing: Edge of bed Upper Body Dressing: Minimal assistance Where Assessed-Upper Body Dressing: Edge of bed Lower Body Dressing: Maximal assistance Where Assessed-Lower Body Dressing: Edge of bed Toileting: Moderate assistance Where Assessed-Toileting: Bed level Toilet Transfer: Minimal assistance Toilet Transfer Method: Stand pivot Toilet Transfer Equipment: Bedside commode ADL Comments: SPT to/from w/c, commode with min A but unable to tolerate sitting at this time due to pain - she opts to use bed pan due to ongoing pain Mobility  Bed Mobility Bed Mobility: Rolling Right;Rolling Left;Left Sidelying to Sit;Sit to Sidelying Left Rolling Right: Independent with assistive device Rolling Left: Independent with assistive device Left Sidelying to Sit: Minimal Assistance - Patient >75% Sit to Sidelying Left: Moderate Assistance - Patient 50-74% Transfers Sit to Stand: Minimal Assistance - Patient > 75% Stand to Sit: Minimal Assistance - Patient > 75%   Discharge Criteria: Patient will be discharged from OT if patient refuses treatment 3 consecutive times without medical reason, if treatment goals not met, if there is a change in medical status, if patient makes no progress towards goals or if patient is discharged from hospital.  The above assessment, treatment plan, treatment alternatives and goals were discussed and mutually agreed upon: by patient  Carlos Levering 05/06/2020, 4:31 PM

## 2020-05-06 NOTE — Progress Notes (Signed)
[place] PHYSICAL MEDICINE & REHABILITATION PROGRESS NOTE   Subjective/Complaints:   Slept ok- No issues. Stiff in hips/legs- voltaren gel helping some;  B/B ok per pt.   Denies any other problems.   ROS:  Pt denies SOB, abd pain, CP, N/V/C/D, and vision changes  Objective:   No results found. Recent Labs    05/05/20 0717 05/06/20 0441  WBC 8.1 8.8  HGB 8.4* 7.7*  HCT 26.9* 24.3*  PLT 120* 119*   Recent Labs    05/05/20 0717 05/06/20 0441  NA 130* 132*  K 4.5 4.8  CL 98 99  CO2 18* 20*  GLUCOSE 105* 79  BUN 43* 40*  CREATININE 2.23* 2.58*  CALCIUM 8.5* 8.5*    Intake/Output Summary (Last 24 hours) at 05/06/2020 0842 Last data filed at 05/06/2020 6468 Gross per 24 hour  Intake 360 ml  Output --  Net 360 ml     Pressure Injury 04/20/20 Buttocks Medial;Lower;Distal Stage 2 -  Partial thickness loss of dermis presenting as a shallow open injury with a red, pink wound bed without slough. weeping and scattered on the sacrum (Active)  04/20/20 2234  Location: Buttocks  Location Orientation: Medial;Lower;Distal  Staging: Stage 2 -  Partial thickness loss of dermis presenting as a shallow open injury with a red, pink wound bed without slough.  Wound Description (Comments): weeping and scattered on the sacrum  Present on Admission: Yes    Physical Exam: Vital Signs Blood pressure (!) 134/94, pulse 96, temperature 98.5 F (36.9 C), temperature source Oral, resp. rate 18, height 5\' 5"  (1.651 m), weight 46.1 kg, SpO2 100 %.   Constitutional:  awake, alert, appropriate, laying on R side in bed- cachetic, NAD HENT: conjugate gaze  Cardiovascular: RRR- no JVD Pulmonary: CTA B/L- no W/R/R- good air movement Abdominal: Soft, NT, ND, (+)BS   Musculoskeletal:     Cervical back: Normal range of motion and neck supple.     Comments: No edema or tenderness in extremities  Skin:    General: Skin is dry.     Comments: Scattered healing hematomas all over arms-  Also  has Stage II sacral decub- weeping-vulvar cancer- open groin R side   Neurological: Ox3 Motor: Bilateral upper extremities: 5/5 proximal distal Right lower extremity: Hip flexion, knee extension 4/5, ankle dorsiflexion 5/5 Left lower extremity: Hip flexion, knee extension 4 -/5, ankle dorsiflexion 5/5 Station intact light touch  Psychiatric:    appropriate, slightly flat     Assessment/Plan: 1. Functional deficits which require 3+ hours per day of interdisciplinary therapy in a comprehensive inpatient rehab setting.  Physiatrist is providing close team supervision and 24 hour management of active medical problems listed below.  Physiatrist and rehab team continue to assess barriers to discharge/monitor patient progress toward functional and medical goals  Care Tool:  Bathing              Bathing assist       Upper Body Dressing/Undressing Upper body dressing        Upper body assist      Lower Body Dressing/Undressing Lower body dressing      What is the patient wearing?: Underwear/pull up     Lower body assist Assist for lower body dressing: Independent     Toileting Toileting    Toileting assist Assist for toileting: Set up assist Assistive Device Comment: Bedpan   Transfers Chair/bed transfer  Transfers assist           Locomotion Ambulation  Ambulation assist              Walk 10 feet activity   Assist           Walk 50 feet activity   Assist           Walk 150 feet activity   Assist           Walk 10 feet on uneven surface  activity   Assist           Wheelchair     Assist               Wheelchair 50 feet with 2 turns activity    Assist            Wheelchair 150 feet activity     Assist          Blood pressure (!) 134/94, pulse 96, temperature 98.5 F (36.9 C), temperature source Oral, resp. rate 18, height 5\' 5"  (1.651 m), weight 46.1 kg, SpO2 100 %.  Medical  Problem List and Plan: 1.  Deficits with balance, standing, mobility, self-care secondary to debility.             -patient may shower             -ELOS/Goals: 7-10 days/Supervision             Admit to CIR  2/3- today first day of therapy 2. H/o DVT/ Antithrombotics: -DVT/anticoagulation:  Pharmaceutical: Other (comment)--low dose eliquis due to nose bleeds.              -antiplatelet therapy: N/A 3. Chronic pain Management:  Methadone --2.5 mg bid since radiation completed. Continue Tylenol tid                Monitor with increased exertion  2/3- pt also using voltaren gel for stiffness/pain- pain stable per pt- will wait to increase/change meds- con't to monitor 4. Mood: LCSW to follow for evaluation and support.              -antipsychotic agents: N/A 5. Neuropsych: This patient is capable of making decisions on her own behalf. 6. Skin/Wound Care: Stage II sacral decub and R labial wounds.              - sacral decub treated with santyl and wet to dry dressing. Order air mattress             - Right labia-->calcium alginate with dry dressing daily.  7. Fluids/Electrolytes/Nutrition: Monitor I/Os. Continue supplements--reports that intake improving.   8. 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 Higher dose Daptomycin-->Tedezolid thorough March 16th.              --Flagyl/Cipro thorough 2/26 to complete course of 6 total weeks  2/3- WBC stable at 8.8- con't regimen             --Weekly CK, CBC, BMP 9. Thrombocytopenia: Has improved with platelets 120 (baseline)             CBC ordered for tomorrow   2/3- Plts 119k- from 120k- stable- con't to monitor 10. Candida esophagitis: On Diflucan thorough 2/12 to compl 11. Gastric ulcer: To continue protonix bid X 6 weeks.              --Continue to monitor H/H with serial checks.              --  transfuse prn hgb< 7.0               See #12 12. Acute on chronic anemia:  Transfused 1/21 and 2/1 with improvement.              CBC ordered for tomorrow  2/3- Hb down to 7.7 from 8.4- will recheck in AM 13. CKD s/p renal transplant: SCr improving 3.4-->2.23. Hyponatremia stable. Continue to monitor MWF for now.             CMP ordered for tomorrow  2/3- Cr up to 2.58- likely due to IV and PO ABX- will monitor M/W/F    LOS: 1 days A FACE TO FACE EVALUATION WAS PERFORMED  Danyella Mcginty 05/06/2020, 8:42 AM

## 2020-05-06 NOTE — Telephone Encounter (Signed)
[name] states that Christina Mack will be on the rehab unit at [facility] for 7-10 days.  She needs to r/s the appointment on 05-13-20 to 05-19-20. Pt scheduled for 05-19-20 at 1115 with Dr. Denman George.

## 2020-05-06 NOTE — Progress Notes (Signed)
Inpatient Rehabilitation  Patient information reviewed and entered into eRehab system by Dalayah Deahl M. Amarrion Pastorino, M.A., CCC/SLP, PPS Coordinator.  Information including medical coding, functional ability and quality indicators will be reviewed and updated through discharge.    

## 2020-05-06 NOTE — Progress Notes (Signed)
Physical Therapy Session Note  Patient Details  Name: Christina Mack MRN: [id number] Date of Birth: 14-May-1965  Today's Date: 05/06/2020 PT Individual Time: 1430-1530 PT Individual Time Calculation (min): 60 min   Short Term Goals: Week 1:  PT Short Term Goal 1 (Week 1): pt to demonstrate supine<>sit CGA PT Short Term Goal 2 (Week 1): pt to demonstrate functional transfers CGA PT Short Term Goal 3 (Week 1): pt to demonstrate ambulation 46' CGA with LRAD PT Short Term Goal 4 (Week 1): pt to tolerate OOB positioning for 45 mins  Skilled Therapeutic Interventions/Progress Updates:  Ambulation/gait training;Community reintegration;DME/adaptive equipment instruction;Neuromuscular re-education;Psychosocial support;Stair training;Wheelchair propulsion/positioning;UE/LE Strength taining/ROM;UE/LE Coordination activities;Therapeutic Activities;Skin care/wound management;Pain management;Discharge planning;Balance/vestibular training;Cognitive remediation/compensation;Disease management/prevention;Functional mobility training;Patient/family education;Splinting/orthotics;Therapeutic Exercise;Visual/perceptual remediation/compensation PAIN pt w/pain at wound sites, increase w/mobility, limited hip ROM to tolerance w/therex, repositioned frequently for comfort.  Pt initially supine, declines sitting ot transferring from bed citing bleheding at wound site.  Agreeable to bed level therex.  therex performed including: Bridging partial rom, unilater knee extension in bridge position partial rom due to weakness, marching in bridge, AAROM hip abd/add, AAROM heelslides, hip IR/ER sidelying - clamshells, hip flexion/extension, knee flexion/extension.  Pt able to roll mod I using bedrails, scoots using bed features w/cues for flexing hips to prevent shear. Discussed seating options and agreed TIS wc w/roho cushion would be best first option for management of pressure Therapist obtained TIS wc w/elevating legrests,  demonstrated process of tilting for pt.  Pt agreed to attempt sitting oob in wc.  Pt assisted on /off bedpan/clothing management, overall mod a but unable to void Will try to arrange pressure mapping to optimize pressure relief and increase tolerance for sitting/oob.  Will discuss w/primary. At end of session, Pt left supine w/rails up x 3, alarm set, bed in lowest position, and needs in reach.    Therapy Documentation Precautions:  Precautions Precautions: Fall Precaution Comments: recent falls Restrictions Weight Bearing Restrictions: No    Therapy/Group: Individual Therapy  Callie Fielding, [place] 05/06/2020, 4:08 PM

## 2020-05-07 LAB — BASIC METABOLIC PANEL
Anion gap: 13 (ref 5–15)
BUN: 40 mg/dL — ABNORMAL HIGH (ref 6–20)
CO2: 21 mmol/L — ABNORMAL LOW (ref 22–32)
Calcium: 8.7 mg/dL — ABNORMAL LOW (ref 8.9–10.3)
Chloride: 99 mmol/L (ref 98–111)
Creatinine, Ser: 2.54 mg/dL — ABNORMAL HIGH (ref 0.44–1.00)
GFR, Estimated: 22 mL/min — ABNORMAL LOW (ref >60)
Glucose, Bld: 83 mg/dL (ref 70–99)
Potassium: 4.9 mmol/L (ref 3.5–5.1)
Sodium: 133 mmol/L — ABNORMAL LOW (ref 135–145)

## 2020-05-07 LAB — CBC
HCT: 23.4 % — ABNORMAL LOW (ref 36.0–46.0)
Hemoglobin: 7.6 g/dL — ABNORMAL LOW (ref 12.0–15.0)
MCH: 29 pg (ref 26.0–34.0)
MCHC: 32.5 g/dL (ref 30.0–36.0)
MCV: 89.3 fL (ref 80.0–100.0)
Platelets: 111 10*3/uL — ABNORMAL LOW (ref 150–400)
RBC: 2.62 MIL/uL — ABNORMAL LOW (ref 3.87–5.11)
RDW: 17.1 % — ABNORMAL HIGH (ref 11.5–15.5)
WBC: 6.9 10*3/uL (ref 4.0–10.5)
nRBC: 0 % (ref 0.0–0.2)

## 2020-05-07 MED ORDER — ONDANSETRON HCL 4 MG PO TABS
4.0000 mg | ORAL_TABLET | Freq: Every day | ORAL | Status: DC
  Administered 2020-05-07 – 2020-05-11 (×5): 4 mg via ORAL
  Filled 2020-05-07 (×5): qty 1

## 2020-05-07 NOTE — Progress Notes (Signed)
Physical Therapy Session Note  Patient Details  Name: Christina Mack MRN: [id number] Date of Birth: Apr 22, 1965  Today's Date: 05/07/2020 PT Individual Time: 0930-1000 and 1400- 1445 PT Individual Time Calculation (min): 30 min and 45 mins  Short Term Goals: Week 1:  PT Short Term Goal 1 (Week 1): pt to demonstrate supine<>sit CGA PT Short Term Goal 2 (Week 1): pt to demonstrate functional transfers CGA PT Short Term Goal 3 (Week 1): pt to demonstrate ambulation 3' CGA with LRAD PT Short Term Goal 4 (Week 1): pt to tolerate OOB positioning for 45 mins  Skilled Therapeutic Interventions/Progress Updates:    session 1: pt received in bed and agreeable to therapy. Pt directed in supine>sit min A with extra time to complete. Pt directed in Sit to stand to Rolling walker min A, gait training with Rolling walker in room as pt requested not to leave room, 62' with multiple turns min A with VC for trunk extension and walker proximity. Pt limited with pain and fatigue, requested to sit down at EOB to rest. Once sitting EOB, pt reported she felt nauseous and requested prolonged rest break to recover and verbalized wanting to walk again. However unfortunately, reported great worsening of nausea and required to return to bed. Min A for sit>supine and pt requested improved comfort. Min A for all repositioning. Pt left in bed, All needs in reach and in good condition. Call light in hand.  Pt missed 15 mins of therapy.  Session 2: pt received in bed and agreeable to therapy however only in bed 2/2 nausea post lunch and fearful she would vomit again. Pt directed in semi-reclined position, 2# arm bar overhead press, chest press, bicep curls, front raises and horizontal abd/adduction, x15 each. Pt required increased time for rest breaks 2/2 fatigue and nausea. Pt benefited from music playing during session for mood. Pt left in bed, All needs in reach and in good condition. Call light in hand.    Therapy  Documentation Precautions:  Precautions Precautions: Fall Precaution Comments: recent falls, wounds Restrictions Weight Bearing Restrictions: No General: PT Amount of Missed Time (min): 15 Minutes PT Missed Treatment Reason: Patient ill (Comment);Pain (nausea/vomiting/pain) Vital Signs:   Pain:   Mobility:   Locomotion :    Trunk/Postural Assessment :    Balance:   Exercises:   Other Treatments:      Therapy/Group: Individual Therapy  Junie Panning 05/07/2020, 3:31 PM

## 2020-05-07 NOTE — Progress Notes (Signed)
[facility] PHYSICAL MEDICINE & REHABILITATION PROGRESS NOTE   Subjective/Complaints:  Pt reports had nose bleed this AM for 15-20 minutes- was "mild". Not lie nosebleed had in acute hospital.   Also vomited up breakfast- says the Flagyl makes her really sick- and she thinks it's the cause- willing to try Zofran scheduled to prevent nausea/vomiting.    ROS:  Pt denies SOB, abd pain, CP, C/D, and vision changes   Objective:   No results found. Recent Labs    05/06/20 0441 05/07/20 0530  WBC 8.8 6.9  HGB 7.7* 7.6*  HCT 24.3* 23.4*  PLT 119* 111*   Recent Labs    05/06/20 0441 05/07/20 0530  NA 132* 133*  K 4.8 4.9  CL 99 99  CO2 20* 21*  GLUCOSE 79 83  BUN 40* 40*  CREATININE 2.58* 2.54*  CALCIUM 8.5* 8.7*    Intake/Output Summary (Last 24 hours) at 05/07/2020 0848 Last data filed at 05/06/2020 1815 Gross per 24 hour  Intake 460 ml  Output -  Net 460 ml     Pressure Injury 04/20/20 Buttocks Medial;Lower;Distal Stage 2 -  Partial thickness loss of dermis presenting as a shallow open injury with a red, pink wound bed without slough. weeping and scattered on the sacrum (Active)  04/20/20 2234  Location: Buttocks  Location Orientation: Medial;Lower;Distal  Staging: Stage 2 -  Partial thickness loss of dermis presenting as a shallow open injury with a red, pink wound bed without slough.  Wound Description (Comments): weeping and scattered on the sacrum  Present on Admission: Yes    Physical Exam: Vital Signs Blood pressure 131/90, pulse 98, temperature 98.6 F (37 C), temperature source Oral, resp. rate 18, height 5\' 5"  (1.651 m), weight 50.8 kg, SpO2 99 %.   Constitutional:  awake, alert, cachetic, NAD HENT: conjugate gaze- no signs of nosebleed right now-   Cardiovascular: RRR- no JVD Pulmonary:CTA B/L- no W/R/R- good air movement Abdominal: Soft, NT, ND, (+)BS -hyperactive Musculoskeletal:     Cervical back: Normal range of motion and neck supple.      Comments: No edema or tenderness in extremities  Skin:    General: Skin is dry.     Comments: Scattered healing hematomas all over arms-  Also has Stage II sacral decub- weeping-vulvar wound on R labia- per picture taken yesterday Neurological: Ox3 Motor: Bilateral upper extremities: 5/5 proximal distal Right lower extremity: Hip flexion, knee extension 4/5, ankle dorsiflexion 5/5 Left lower extremity: Hip flexion, knee extension 4 -/5, ankle dorsiflexion 5/5 Station intact light touch  Psychiatric:    aappropriate- cordial- slightly flat     Assessment/Plan: 1. Functional deficits which require 3+ hours per day of interdisciplinary therapy in a comprehensive inpatient rehab setting.  Physiatrist is providing close team supervision and 24 hour management of active medical problems listed below.  Physiatrist and rehab team continue to assess barriers to discharge/monitor patient progress toward functional and medical goals  Care Tool:  Bathing    Body parts bathed by patient: Right arm,Left arm,Chest,Abdomen,Front perineal area,Buttocks,Right upper leg,Left upper leg,Face   Body parts bathed by helper: Right lower leg,Left lower leg,Buttocks     Bathing assist Assist Level: Moderate Assistance - Patient 50 - 74%     Upper Body Dressing/Undressing Upper body dressing   What is the patient wearing?: Pull over shirt    Upper body assist Assist Level: Minimal Assistance - Patient > 75%    Lower Body Dressing/Undressing Lower body dressing  What is the patient wearing?: Underwear/pull up,Pants     Lower body assist Assist for lower body dressing: Independent     Toileting Toileting    Toileting assist Assist for toileting: Set up assist Assistive Device Comment: Bedpan   Transfers Chair/bed transfer  Transfers assist     Chair/bed transfer assist level: Minimal Assistance - Patient > 75%     Locomotion Ambulation   Ambulation assist      Assist  level: Minimal Assistance - Patient > 75% Assistive device: Walker-rolling Max distance: 25   Walk 10 feet activity   Assist     Assist level: Minimal Assistance - Patient > 75% Assistive device: Walker-rolling   Walk 50 feet activity   Assist    Assist level: Minimal Assistance - Patient > 75% Assistive device: Walker-rolling    Walk 150 feet activity   Assist Walk 150 feet activity did not occur: Safety/medical concerns         Walk 10 feet on uneven surface  activity   Assist           Wheelchair     Assist Will patient use wheelchair at discharge?: Yes (pt may benefit from from [facility] for home pending progression, unable to assess on eval 2/2 pt's inability to tolerate sitting in WC 2/2 pain)   Wheelchair activity did not occur: Safety/medical concerns         Wheelchair 50 feet with 2 turns activity    Assist    Wheelchair 50 feet with 2 turns activity did not occur: Safety/medical concerns       Wheelchair 150 feet activity     Assist  Wheelchair 150 feet activity did not occur: Safety/medical concerns       Blood pressure 131/90, pulse 98, temperature 98.6 F (37 C), temperature source Oral, resp. rate 18, height 5\' 5"  (1.651 m), weight 50.8 kg, SpO2 99 %.  Medical Problem List and Plan: 1.  Deficits with balance, standing, mobility, self-care secondary to debility.             -patient may shower             -ELOS/Goals: 7-10 days/Supervision             Admit to CIR  2/3- today first day of therapy 2. H/o DVT/ Antithrombotics: -DVT/anticoagulation:  Pharmaceutical: Other (comment)--low dose eliquis due to nose bleeds.              -antiplatelet therapy: N/A 3. Chronic pain Management:  Methadone --2.5 mg bid since radiation completed. Continue Tylenol tid                Monitor with increased exertion  2/3- pt also using voltaren gel for stiffness/pain- pain stable per pt- will wait to increase/change meds- con't to  monitor  2/4- says pain controlled- con't regimen 4. Mood: LCSW to follow for evaluation and support.              -antipsychotic agents: N/A 5. Neuropsych: This patient is capable of making decisions on her own behalf. 6. Skin/Wound Care: Stage II sacral decub and R labial wounds.              - sacral decub treated with santyl and wet to dry dressing. Order air mattress             - Right labia-->calcium alginate with dry dressing daily.  2/4- is draining a lot of yellow drainag-e pt refusing packing- only laying  wound care ON IT- likely reason not healing well.   7. Fluids/Electrolytes/Nutrition: Monitor I/Os. Continue supplements--reports that intake improving.   8. 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 Higher dose Daptomycin-->Tedezolid thorough March 16th.              --Flagyl/Cipro thorough 2/26 to complete course of 6 total weeks  2/3- WBC stable at 8.8- con't regimen             --Weekly CK, CBC, BMP 9. Thrombocytopenia: Has improved with platelets 120 (baseline)             CBC ordered for tomorrow   2/3- Plts 119k- from 120k- stable- con't to monitor 10. Candida esophagitis: On Diflucan thorough 2/12 to compl 11. Gastric ulcer: To continue protonix bid X 6 weeks.              --Continue to monitor H/H with serial checks.              --transfuse prn hgb< 7.0              See #12 12. Acute on chronic anemia:  Transfused 1/21 and 2/1 with improvement.              CBC ordered for tomorrow  2/3- Hb down to 7.7 from 8.4- will recheck in AM 13. CKD s/p renal transplant: SCr improving 3.4-->2.23. Hyponatremia stable. Continue to monitor MWF for now.             CMP ordered for tomorrow  2/3- Cr up to 2.58- likely due to IV and PO ABX- will monitor M/W/F   2/4- Cr is stable at 2.54- will recheck M/W/F 14. Nausea/Vomiting  2/4- will schedule Zofran 4 mg at 0630  every day to hopefully prevent nausea/vomting.    LOS: 2 days A FACE TO FACE EVALUATION WAS PERFORMED  Christina Mack 05/07/2020, 8:48 AM

## 2020-05-07 NOTE — Progress Notes (Signed)
Occupational Therapy Session Note  Patient Details  Name: Christina Mack MRN: [id number] Date of Birth: [birth year]  Today's Date: 05/07/2020 OT Individual Time: [phone] and 1120-1200 OT Individual Time Calculation (min): 57 min and 40 min  Short Term Goals: Week 1:  OT Short Term Goal 1 (Week 1): patient will complete bed mobility with CS using assistive devices as needed OT Short Term Goal 2 (Week 1): patient will complete SPT with RW CS level OT Short Term Goal 3 (Week 1): patient will complete lower body adl min A with assistive devices  Skilled Therapeutic Interventions/Progress Updates:    Pt greeted in bed with no c/o pain, however just vomited up her breakfast and reported having some nose bleeding that nursing was already aware of. Pt agreeable to tx, requesting light tx this AM. Setup for face washing/oral care/hand washing using sanitizer. Next guided pt through UB exercises and stretches with bed placed in chair position HOB ~30 degrees, progressing to pt tolerating ~39 degrees. Note that she needed several sidelying rest breaks to accommodate sacral wound pain. She completed exercises using yellow tband/1# bar 15 reps 2 sets each exercise. OT tied theraband to her bedrails and taught her additional exercises that she could complete in the room independently and also set up the leg strap so that she could perform gentle LE stretching with the Lt LE. Pt remained in sidelying at close of session, all needs within reach, bedpan placed on a nearby chair in case she needed to set herself up with it independently.   2nd Session 1:1 tx (40 min) Pt greeted in bed with no c/o pain. She did report walking with PT earlier and feeling very nauseated after, asking OT to complete bedlevel exercises vs EOB due to nausea. HOB raised ~40 degrees and then pt was guided through UB/core exercises using 1 kg ball x15 reps each exercise. We listened to meaningful music which appeared to brighten affect. Pt  remained in bed at close of session, in sidelying position for pressure relief, all needs within reach.   Therapy Documentation Precautions:  Precautions Precautions: Fall Precaution Comments: recent falls, wounds Restrictions Weight Bearing Restrictions: No ADL: ADL Eating: Set up Where Assessed-Eating: Bed level Grooming: Setup Where Assessed-Grooming: Bed level Upper Body Bathing: Setup Where Assessed-Upper Body Bathing: Edge of bed Lower Body Bathing: Maximal assistance Where Assessed-Lower Body Bathing: Edge of bed Upper Body Dressing: Minimal assistance Where Assessed-Upper Body Dressing: Edge of bed Lower Body Dressing: Maximal assistance Where Assessed-Lower Body Dressing: Edge of bed Toileting: Moderate assistance Where Assessed-Toileting: Bed level Toilet Transfer: Minimal assistance Toilet Transfer Method: Stand pivot Toilet Transfer Equipment: Bedside commode ADL Comments: SPT to/from w/c, commode with min A but unable to tolerate sitting at this time due to pain - she opts to use bed pan due to ongoing pain      Therapy/Group: Individual Therapy  [name] [name] 05/07/2020, 12:25 PM

## 2020-05-07 NOTE — Plan of Care (Signed)
  Problem: Consults Goal: RH GENERAL PATIENT EDUCATION Description: See Patient Education module for education specifics. Outcome: Progressing Goal: Skin Care Protocol Initiated - if Braden Score 18 or less Description: If consults are not indicated, leave blank or document N/A Outcome: Progressing Goal: Nutrition Consult-if indicated Outcome: Progressing   Problem: RH SKIN INTEGRITY Goal: RH STG SKIN FREE OF INFECTION/BREAKDOWN Description: Remain free of additional breakdown and infection with min assist. Outcome: Progressing Goal: RH STG MAINTAIN SKIN INTEGRITY WITH ASSISTANCE Description: STG Maintain Skin Integrity With min Assistance. Outcome: Progressing Goal: RH STG ABLE TO PERFORM INCISION/WOUND CARE W/ASSISTANCE Description: STG Able To Perform Incision/Wound Care With min Assistance. Outcome: Progressing   Problem: RH SAFETY Goal: RH STG ADHERE TO SAFETY PRECAUTIONS W/ASSISTANCE/DEVICE Description: STG Adhere to Safety Precautions With min Assistance/Device. Outcome: Progressing   Problem: RH PAIN MANAGEMENT Goal: RH STG PAIN MANAGED AT OR BELOW PT'S PAIN GOAL Description: <3 on a 0-10 pain scale Outcome: Progressing   Problem: RH KNOWLEDGE DEFICIT GENERAL Goal: RH STG INCREASE KNOWLEDGE OF SELF CARE AFTER HOSPITALIZATION Description: Patient will demonstrate knowledge of medication management, wound/skin care management, follow up care with educational materials and handouts provided by staff. Outcome: Progressing

## 2020-05-07 NOTE — IPOC Note (Signed)
Overall Plan of Care [facility]) Patient Details Name: Christina Mack MRN: [id number] DOB: 1966-01-09  Admitting Diagnosis: [facility] Problems: Principal Problem:   Debility Active Problems:   Pressure injury of skin     Functional Problem List: Nursing Bladder,Bowel,Edema,Endurance,Medication Management,Nutrition,Skin Integrity,Safety,Perception,Pain  PT Balance,Skin Integrity,Endurance,Pain,Nutrition  OT Endurance,Pain,Skin Integrity  SLP    TR         Basic ADL's: OT Grooming,Bathing,Dressing,Toileting     Advanced  ADL's: OT       Transfers: PT Bed Mobility,Bed to [employer]  OT Toilet     Locomotion: PT Ambulation,Stairs,Wheelchair Mobility     Additional Impairments: OT    SLP        TR      Anticipated Outcomes Item Anticipated Outcome  Self Feeding independent  Swallowing      Basic self-care  set up/mod I with assistive devices  Toileting  CS   Bathroom Transfers CS  Bowel/Bladder  min assist  Transfers  supervision wiht LRAD  Locomotion  CGA with LRAD  Communication     Cognition     Pain  < 3  Safety/Judgment  min assist   Therapy Plan: PT Intensity: Minimum of 1-2 x/day ,45 to 90 minutes PT Frequency: 5 out of 7 days PT Duration Estimated Length of Stay: 10-14 days OT Intensity: Minimum of 1-2 x/day, 45 to 90 minutes OT Frequency: 5 out of 7 days OT Duration/Estimated Length of Stay: 10-14 days     Due to the current state of emergency, patients may not be receiving their 3-hours of Medicare-mandated therapy.   Team Interventions: Nursing Interventions Patient/Family Education,Pain Management,Bladder Management,Medication Management,Discharge Planning,Bowel Management,Skin Care/Wound Management,Psychosocial Support,Disease Management/Prevention  PT interventions Ambulation/gait training,Community Corporate treasurer re-education,Psychosocial support,Stair training,Wheelchair  propulsion/positioning,UE/LE Strength taining/ROM,UE/LE Coordination activities,Therapeutic Activities,Skin care/wound management,Pain management,Discharge planning,Balance/vestibular training,Cognitive remediation/compensation,Disease management/prevention,Functional mobility training,Patient/family education,Splinting/orthotics,Therapeutic Exercise,Visual/perceptual remediation/compensation  OT Interventions Self Care/advanced ADL retraining,Therapeutic Electronics engineer instruction,Pain management,Skin care/wound managment,Patient/family education,Discharge planning,Functional mobility training,Therapeutic Activities  SLP Interventions    TR Interventions    SW/CM Interventions Disease Management/Prevention,Patient/Family Education,Psychosocial Support,Discharge Planning   Barriers to Discharge MD  Medical stability, Home enviroment access/loayout, IV antibiotics, Wound care, Lack of/limited family support, Weight and Medication compliance  Nursing Inaccessible home environment,Decreased caregiver support,Home environment access/layout,IV antibiotics,Incontinence,Wound Care,Lack of/limited family support,Medication compliance,Behavior,Nutrition means    PT Inaccessible home environment,Home environment access/layout,Wound Care,Nutrition means    OT      SLP      SW Wound Care,Lack of/limited family support,Decreased caregiver support,Home environment access/layout,IV antibiotics,Neurogenic Bowel & Bladder     Team Discharge Planning: Destination: PT-Home ,OT- Home , SLP-  Projected Follow-up: PT-Home health PT, OT-  Home health OT, SLP-  Projected Equipment Needs: PT-To be determined, OT- To be determined, SLP-  Equipment Details: PT-only has SPC with quad cane attachment, OT-patient states that she owns commode and tub seat Patient/family involved in discharge planning: PT- Patient,  OT-Patient, SLP-   MD ELOS: 10-14 days Medical Rehab Prognosis:  Good Assessment: Pt is  a 55 yr old female with hx of debility- has R labial/vulvar wound from Stage IV vulvar Cancer with possible osteomyelitis- getting IV ABX, also has Stage II sacral decub; H/O DVT on Eliquis 2.5 mg daily; CKD s/p renal transplant- and N/V from Flagyl- scheduled Zofran to help.   Goals CGA-min A by d/c.,    See Team Conference Notes for weekly updates to the plan of care

## 2020-05-08 DIAGNOSIS — N184 Chronic kidney disease, stage 4 (severe): Secondary | ICD-10-CM

## 2020-05-08 DIAGNOSIS — D696 Thrombocytopenia, unspecified: Secondary | ICD-10-CM

## 2020-05-08 DIAGNOSIS — D649 Anemia, unspecified: Secondary | ICD-10-CM

## 2020-05-08 DIAGNOSIS — G893 Neoplasm related pain (acute) (chronic): Secondary | ICD-10-CM

## 2020-05-08 DIAGNOSIS — G894 Chronic pain syndrome: Secondary | ICD-10-CM

## 2020-05-08 LAB — MIC RESULT

## 2020-05-08 LAB — MINIMUM INHIBITORY CONC. (1 DRUG)

## 2020-05-08 NOTE — Progress Notes (Signed)
Physical Therapy Session Note  Patient Details  Name: Christina Mack MRN: [id number] Date of Birth: May 03, 1965  Today's Date: 05/08/2020 PT Individual Time: [id number]  And 1400-1430 PT Individual Time Calculation (min): 60 min and 30 min  Short Term Goals: Week 1:  PT Short Term Goal 1 (Week 1): pt to demonstrate supine<>sit CGA PT Short Term Goal 2 (Week 1): pt to demonstrate functional transfers CGA PT Short Term Goal 3 (Week 1): pt to demonstrate ambulation 58' CGA with LRAD PT Short Term Goal 4 (Week 1): pt to tolerate OOB positioning for 45 mins Week 2:    Week 3:     Skilled Therapeutic Interventions/Progress Updates:    am session PAIN Pt c/o R hip and knee pain as well as pain at wound site in sitting, tilt adjusted periodically with significant improvements, mobility performed to address as well.  Pt initially supine and sleeping soundly.  Did not rouse to voice.  Therapist altered scheduled and returned x 1 hr.  Pt easliy wakened at this time.  Requests bed level activity, but easily convinced to try OOB to wc.  Pt min assist for removal of soiled net underwear, placement of and removal of bedpan (pt continent of urine), donning of clean underwear, abd pad, pants.  Total assist for socks for time management.  Pt rolls mod I for all.  Supine to sit on edge of bed w/min assist.  Pt c/o mild dizzyness.  Sits w/cga and performs ankle pumps and TKEs x 20 as warm up, dizzyness resolved.   Sit to stand w/min assist, stand pivot transfer to wc w/RW and min assist.  Pt transported to sink, tilted to decrease pressure/discomfort, pt combs hair w/set up only.  Pt agreeable to transport to gym for continued session.  Pt headrest adjusted by therapist for support/comfort.   Pt then transported to parallel bars. Sit to stand w/parallel bars w/min assist.  Gait length of bars x 4 for total distance 32ft w/cga. Pt transported to room.  Tilted for comfort.  Agreeable to attempt to remain oob in  wc x 1 hr, OT scheduled in 1 hr, discussed plan w/OT.  Also informed pt nurse and nurse states she is able to adjust tilt or assist pt back to bed if needed.  Pt left oob in wc w/alarm belt set and needs in reach   Pm session: PAIN pain in sacral area, frequent repositioning, treatment to tolerance.  Pt initially supine.  reqested "bed exercises" but easily convinced of benefits of gait. Supine to L side to sit w min assist.  Sits several min to adjust to upright. Sit to stand w/min asssist w/bed elevated. Gait  28ft x 2 w/RW, cga, wobbles at L knee but no buckling, UE tremors w/fatigue. Between gait trials pt performs seated LE therex: AAROM hip flexion 1.x0 Heel raises 2x10 LAQs 1 x 10, very challenging on LLE Hip abd w/orange L3 theraband resistance 2x10  Sit to stand w/min assist.  Pt able to stand x 2 min and perform scapiular retractions x 10 for postural strengthening.  Pt sat additional 5 min at edge of bed.  Therapist released some air from overinflatede roho.  Would benefit from pressure mapping to optimize pressure in sitting when pt able to tolerate.  Sit to supine w/mod assist for LE management.  Pt rolls and scoots using bed features/rails only.   Pt left supine w/rails up x 3, alarm set, bed in lowest position, and needs in reach.  Noted pt w/some difficulty recalling am activities, confuses which activities occurred w/which therapist.  Tolerated oob in [place] well today. Seems to have appreciated time out of room, slightly better spirits.    Therapy Documentation Precautions:  Precautions Precautions: Fall Precaution Comments: recent falls, wounds Restrictions Weight Bearing Restrictions: No   Therapy/Group: Individual Therapy  Callie Fielding, [place] 05/08/2020, 12:35 PM

## 2020-05-08 NOTE — Progress Notes (Signed)
Occupational Therapy Session Note  Patient Details  Name: Christina Mack MRN: [id number] Date of Birth: 29-Dec-1965  Today's Date: 05/08/2020 OT Individual Time: [phone] and [id number] OT Individual Time Calculation (min): 56 min and 44 min  Short Term Goals: Week 1:  OT Short Term Goal 1 (Week 1): patient will complete bed mobility with CS using assistive devices as needed OT Short Term Goal 2 (Week 1): patient will complete SPT with RW CS level OT Short Term Goal 3 (Week 1): patient will complete lower body adl min A with assistive devices  Skilled Therapeutic Interventions/Progress Updates:    Pt greeted in bed, in sidelying position to manage her wound pain. She reported she vomited her breakfast again like yesterday morning. Pt agreeable to participate in bathing/dressing tasks seated EOB per her tolerance during session. Supine<sit completed with increased time and pt using the hospital bed controls to assist. She completed UB bathing/dressing with setup assistance. Mod A to wash LB excluding periareas. Per pt, nursing staff had just changed her wound dressings and therefore she did not want to doff her mesh underwear. She then reported needing to lie back down due to wound pain. Mod A for transition and noted that there was drainage from wounds. Changed the chuck pad with pt rolling Rt>Lt with Min A and pt changed out one of her drainage pads. She changed her pillowcases with setup assist and then positioned herself in a more comfortable sidelying position for pressure relief. Setup for oral care with [facility] elevated, pt refusing to sit EOB or transfer OOB to do so due to pain. Next guided her through UB strengthening and stretching exercises including overhead/forward/lateral arm circles x20 reps 3 sets and gentle cervical stretches. Reiterated to pt the importance of stretching/gentle exercise while in bed to prevent muscle atrophy and muscle tightness. She verbalized understanding. At end of  session pt remained in bed with all needs within reach. Tx focus placed on upright tolerance, functional activity tolerance, ADL retraining, strengthening, and pt education.   2nd Session 1:1 tx (44 min) Pt greeted in the TIS, presenting an emesis bag of vomit when asked how she did sitting up in the chair. Pt frustrated by her vomiting spells, stated that she did not eat anything but just vomited after PT session ended. She was agreeable to leave the unit for a bit during therapy and then wanted to return to bed. Escorted pt to the atrium of the hospital and parked the TIS in front of a sunny window to cheer her up. Guided pt through pressure relief exercises including lateral/forward weight shifts and w/c push ups, pt maintaining positions for 30 second-1 minute holds x2 sets. She reports that she likes coloring pages and doing word search/crossword puzzles, has puzzles and reading material in the room. Discussed importance of using these activities and materials for psychosocial health and also suggested doing some puzzles when sitting in TIS to increase OOB tolerance. When she returned to the room pt completed stand pivot<bed using RW with CGA. Mod A for returning to supine position. Left her with all needs within reach, pt in sidelying for pressure relief/pain mgt due to wounds.   Therapy Documentation Precautions:  Precautions Precautions: Fall Precaution Comments: recent falls, wounds Restrictions Weight Bearing Restrictions: No Pain: wound pain, pt reported positioning strategies to suffice in management during sessions Pain Assessment Pain Scale: 0-10 Pain Score: 0-No pain ADL: ADL Eating: Set up Where Assessed-Eating: Bed level Grooming: Setup Where Assessed-Grooming: Bed level Upper  Body Bathing: Setup Where Assessed-Upper Body Bathing: Edge of bed Lower Body Bathing: Maximal assistance Where Assessed-Lower Body Bathing: Edge of bed Upper Body Dressing: Minimal assistance Where  Assessed-Upper Body Dressing: Edge of bed Lower Body Dressing: Maximal assistance Where Assessed-Lower Body Dressing: Edge of bed Toileting: Moderate assistance Where Assessed-Toileting: Bed level Toilet Transfer: Minimal assistance Toilet Transfer Method: Stand pivot Toilet Transfer Equipment: Bedside commode ADL Comments: SPT to/from w/c, commode with min A but unable to tolerate sitting at this time due to pain - she opts to use bed pan due to ongoing pain      Therapy/Group: Individual Therapy  Aven Christen A Tamyra Fojtik 05/08/2020, 12:32 PM

## 2020-05-08 NOTE — Progress Notes (Signed)
[place] PHYSICAL MEDICINE & REHABILITATION PROGRESS NOTE   Subjective/Complaints: Patient seen sitting up in bed this morning working with therapies.  She states she did not sleep well overnight due to people coming in/out of room as well as not being in her bed.  She complains of intermittent nausea.  ROS: + Intermittent nausea. Denies CP, SOB, N/V/D  Objective:   No results found. Recent Labs    05/06/20 0441 05/07/20 0530  WBC 8.8 6.9  HGB 7.7* 7.6*  HCT 24.3* 23.4*  PLT 119* 111*   Recent Labs    05/06/20 0441 05/07/20 0530  NA 132* 133*  K 4.8 4.9  CL 99 99  CO2 20* 21*  GLUCOSE 79 83  BUN 40* 40*  CREATININE 2.58* 2.54*  CALCIUM 8.5* 8.7*    Intake/Output Summary (Last 24 hours) at 05/08/2020 1004 Last data filed at 05/08/2020 0857 Gross per 24 hour  Intake 480 ml  Output --  Net 480 ml     Pressure Injury 04/20/20 Buttocks Medial;Lower;Distal Stage 2 -  Partial thickness loss of dermis presenting as a shallow open injury with a red, pink wound bed without slough. weeping and scattered on the sacrum (Active)  04/20/20 2234  Location: Buttocks  Location Orientation: Medial;Lower;Distal  Staging: Stage 2 -  Partial thickness loss of dermis presenting as a shallow open injury with a red, pink wound bed without slough.  Wound Description (Comments): weeping and scattered on the sacrum  Present on Admission: Yes    Physical Exam: Vital Signs Blood pressure (!) 142/97, pulse 92, temperature 98.5 F (36.9 C), temperature source Oral, resp. rate 14, height 5\' 5"  (1.651 m), weight 50.3 kg, SpO2 100 %. Constitutional: No distress . Vital signs reviewed. HENT: Normocephalic.  Atraumatic. Eyes: EOMI. No discharge. Cardiovascular: No JVD.  RRR. Respiratory: Normal effort.  No stridor.  Bilateral clear to auscultation. GI: Non-distended.  BS +. Skin: Warm and dry.   Scattered healing hematomas Sacral ulcer not examined today Psych: Flat..  Normal  behavior. Musc: No edema in extremities.  No tenderness in extremities. Neuro: Alert Motor: Bilateral upper extremities: 5/5 proximal distal Right lower extremity: Hip flexion, knee extension 2+/5, ankle dorsiflexion 5/5 Left lower extremity: Hip flexion, knee extension 4 -/5, ankle dorsiflexion 5/5 Station intact light touch    Assessment/Plan: 1. Functional deficits which require 3+ hours per day of interdisciplinary therapy in a comprehensive inpatient rehab setting.  Physiatrist is providing close team supervision and 24 hour management of active medical problems listed below.  Physiatrist and rehab team continue to assess barriers to discharge/monitor patient progress toward functional and medical goals  Care Tool:  Bathing    Body parts bathed by patient: Right arm,Left arm,Chest,Abdomen,Front perineal area,Buttocks,Right upper leg,Left upper leg,Face   Body parts bathed by helper: Right lower leg,Left lower leg,Buttocks     Bathing assist Assist Level: Moderate Assistance - Patient 50 - 74%     Upper Body Dressing/Undressing Upper body dressing   What is the patient wearing?: Pull over shirt    Upper body assist Assist Level: Set up assist    Lower Body Dressing/Undressing Lower body dressing    Lower body dressing activity did not occur: Refused What is the patient wearing?: Underwear/pull up,Pants     Lower body assist Assist for lower body dressing: Independent     Toileting Toileting    Toileting assist Assist for toileting: Set up assist Assistive Device Comment: Bedpan   Transfers Chair/bed transfer  Transfers assist  Chair/bed transfer assist level: Minimal Assistance - Patient > 75%     Locomotion Ambulation   Ambulation assist      Assist level: Minimal Assistance - Patient > 75% Assistive device: Walker-rolling Max distance: 25   Walk 10 feet activity   Assist     Assist level: Minimal Assistance - Patient >  75% Assistive device: Walker-rolling   Walk 50 feet activity   Assist    Assist level: Minimal Assistance - Patient > 75% Assistive device: Walker-rolling    Walk 150 feet activity   Assist Walk 150 feet activity did not occur: Safety/medical concerns         Walk 10 feet on uneven surface  activity   Assist Walk 10 feet on uneven surfaces activity did not occur: Safety/medical concerns         Wheelchair     Assist Will patient use wheelchair at discharge?: Yes (pt may benefit from from [facility] for home pending progression, unable to assess on eval 2/2 pt's inability to tolerate sitting in WC 2/2 pain)   Wheelchair activity did not occur: Safety/medical concerns         Wheelchair 50 feet with 2 turns activity    Assist    Wheelchair 50 feet with 2 turns activity did not occur: Safety/medical concerns       Wheelchair 150 feet activity     Assist  Wheelchair 150 feet activity did not occur: Safety/medical concerns       Blood pressure (!) 142/97, pulse 92, temperature 98.5 F (36.9 C), temperature source Oral, resp. rate 14, height 5\' 5"  (1.651 m), weight 50.3 kg, SpO2 100 %.  Medical Problem List and Plan: 1.  Deficits with balance, standing, mobility, self-care secondary to debility.            Continue CIR 2. H/o DVT/ Antithrombotics: -DVT/anticoagulation:  Pharmaceutical: Other (comment)--low dose eliquis due to nose bleeds.              -antiplatelet therapy: N/A 3. Chronic pain Management:  Methadone --2.5 mg bid since radiation completed. Continue Tylenol tid                Monitor with increased exertion  2/3- pt also using voltaren gel for stiffness/pain- pain stable per pt- will wait to increase/change meds- con't to monitor  Controlled with meds on 2/5 4. Mood: LCSW to follow for evaluation and support.              -antipsychotic agents: N/A 5. Neuropsych: This patient is capable of making decisions on her own behalf. 6.  Skin/Wound Care: Stage II sacral decub and R labial wounds.              - sacral decub treated with santyl and wet to dry dressing.   Ordered air mattress             - Right labia-->calcium alginate with dry dressing daily. 7. Fluids/Electrolytes/Nutrition: Monitor I/Os. Continue supplements--reports that intake improving.   8. 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 Higher dose Daptomycin-->Tedezolid thorough 3/16.              Flagyl/Cipro thorough 2/26 to complete course of 6 total weeks  Weekly CK, CBC, BMP 9. Thrombocytopenia: Platelets 120 (baseline)             Platelets 111 on 2/4, labs ordered for  Monday 10. Candida esophagitis: On Diflucan thorough 2/12 to compl 11. Gastric ulcer: To continue protonix bid X 6 weeks.              --Continue to monitor H/H with serial checks.              --transfuse prn hgb< 7.0              See #12 12. Acute on chronic anemia:  Transfused 1/21 and 2/1 with improvement.              Hemoglobin 7.6 on 7/4, labs ordered for Monday 13. CKD s/p renal transplant:             Creatinine 2.54 on 2/4, labs ordered for Monday 14. Nausea/Vomiting  Scheduled Zofran 4 mg at 0630 every day to hopefully prevent nausea/vomting.    LOS: 3 days A FACE TO FACE EVALUATION WAS PERFORMED  Paxton Binns Lorie Phenix 05/08/2020, 10:04 AM

## 2020-05-08 NOTE — Progress Notes (Signed)
Pharmacy Antibiotic Note  Christina Mack is a 55 y.o. 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 Hospital course complicated by bactermeia due to bacteroides/eggerthella species. Pharmacy has been consulted for daptomycin dosing at higher dose of 10mg /kg. Patient has been approved for CIR and will begin IV antibiotic therapy while inpatient. Plan per ID is to transition to oral levaquin and flagyl at discharge and finish 6 weeks total of antibiotics on February 26th.   Patient now in [place] - has been switched to oral Levaquin and Flagyl. Daptomycin dose still appropriate, CK from 2/3 is wnl. Patient remains afebrile and Scr is elevated at 2.54. Plan per ID note on 2/2 is to continue daptomycin while in CIR then finish out course with tedizolid through March 16th with [facility] follow up.   Plan: Continue daptomycin 500mg  Q48H  Monitor weekly CK (next on 2/10) Monitor renal function and adverse effects  Height: 5\' 5"  (165.1 cm) Weight: 50.3 kg (111 lb) IBW/kg (Calculated) : 57  Temp (24hrs), Avg:98.6 F (37 C), Min:98.5 F (36.9 C), Max:98.9 F (37.2 C)  Recent Labs  Lab 05/03/20 0142 05/04/20 0722 05/05/20 0717 05/06/20 0441 05/07/20 0530  WBC 7.7 7.7 8.1 8.8 6.9  CREATININE 2.62* 2.53* 2.23* 2.58* 2.54*    Estimated Creatinine Clearance: 20.3 mL/min (A) (by C-G formula based on SCr of 2.54 mg/dL (H)).    Allergies  Allergen Reactions  . Zosyn [Piperacillin Sod-Tazobactam So] Other (See Comments)    Thrombocytopenia  . Codeine     vomiting  . Hydromorphone Nausea And Vomiting  . Oxycodone-Acetaminophen Itching, Nausea Only, Other (See Comments) and Nausea And Vomiting    Other Reaction: HA    Antimicrobials this admission: Diflucan 1/22> Dapto 2/2>> Levaquin 2/2>> Flagyl 2/2>>  Dose adjustments this admission:   Microbiology results: 1/1 blood>>Bacteroides ovatus/xylanisolvens and Eggerthella lenta can't see  sens. 1/25 blood>>negF 1/28 abd>>enterococcus faecium  Thank you for allowing pharmacy to be a part of this patient's care.  Rebbeca Paul, PharmD PGY1 Pharmacy Resident 05/08/2020 10:15 AM  Please check [email or web address] for unit-specific pharmacy phone numbers.

## 2020-05-08 NOTE — Plan of Care (Signed)
  Problem: Consults Goal: RH GENERAL PATIENT EDUCATION Description: See Patient Education module for education specifics. Outcome: Progressing Goal: Skin Care Protocol Initiated - if Braden Score 18 or less Description: If consults are not indicated, leave blank or document N/A Outcome: Progressing Goal: Nutrition Consult-if indicated Outcome: Progressing   Problem: RH SKIN INTEGRITY Goal: RH STG SKIN FREE OF INFECTION/BREAKDOWN Description: Remain free of additional breakdown and infection with min assist. Outcome: Progressing Goal: RH STG MAINTAIN SKIN INTEGRITY WITH ASSISTANCE Description: STG Maintain Skin Integrity With min Assistance. Outcome: Progressing Goal: RH STG ABLE TO PERFORM INCISION/WOUND CARE W/ASSISTANCE Description: STG Able To Perform Incision/Wound Care With min Assistance. Outcome: Progressing   Problem: RH SAFETY Goal: RH STG ADHERE TO SAFETY PRECAUTIONS W/ASSISTANCE/DEVICE Description: STG Adhere to Safety Precautions With min Assistance/Device. Outcome: Progressing   Problem: RH PAIN MANAGEMENT Goal: RH STG PAIN MANAGED AT OR BELOW PT'S PAIN GOAL Description: <3 on a 0-10 pain scale Outcome: Progressing   Problem: RH KNOWLEDGE DEFICIT GENERAL Goal: RH STG INCREASE KNOWLEDGE OF SELF CARE AFTER HOSPITALIZATION Description: Patient will demonstrate knowledge of medication management, wound/skin care management, follow up care with educational materials and handouts provided by staff. Outcome: Progressing

## 2020-05-09 MED ORDER — SENNA 8.6 MG PO TABS
2.0000 | ORAL_TABLET | Freq: Every day | ORAL | Status: DC
  Administered 2020-05-09 – 2020-05-13 (×4): 17.2 mg via ORAL
  Filled 2020-05-09 (×9): qty 2

## 2020-05-09 MED ORDER — POLYETHYLENE GLYCOL 3350 17 G PO PACK
17.0000 g | PACK | Freq: Every day | ORAL | Status: DC
  Administered 2020-05-10: 17 g via ORAL
  Filled 2020-05-09 (×5): qty 1

## 2020-05-09 NOTE — Plan of Care (Signed)
  Problem: Consults Goal: RH GENERAL PATIENT EDUCATION Description: See Patient Education module for education specifics. Outcome: Progressing Goal: Skin Care Protocol Initiated - if Braden Score 18 or less Description: If consults are not indicated, leave blank or document N/A Outcome: Progressing Goal: Nutrition Consult-if indicated Outcome: Progressing   Problem: RH SKIN INTEGRITY Goal: RH STG SKIN FREE OF INFECTION/BREAKDOWN Description: Remain free of additional breakdown and infection with min assist. Outcome: Progressing Goal: RH STG MAINTAIN SKIN INTEGRITY WITH ASSISTANCE Description: STG Maintain Skin Integrity With min Assistance. Outcome: Progressing Goal: RH STG ABLE TO PERFORM INCISION/WOUND CARE W/ASSISTANCE Description: STG Able To Perform Incision/Wound Care With min Assistance. Outcome: Progressing   Problem: RH SAFETY Goal: RH STG ADHERE TO SAFETY PRECAUTIONS W/ASSISTANCE/DEVICE Description: STG Adhere to Safety Precautions With min Assistance/Device. Outcome: Progressing   Problem: RH PAIN MANAGEMENT Goal: RH STG PAIN MANAGED AT OR BELOW PT'S PAIN GOAL Description: <3 on a 0-10 pain scale Outcome: Progressing   Problem: RH KNOWLEDGE DEFICIT GENERAL Goal: RH STG INCREASE KNOWLEDGE OF SELF CARE AFTER HOSPITALIZATION Description: Patient will demonstrate knowledge of medication management, wound/skin care management, follow up care with educational materials and handouts provided by staff. Outcome: Progressing

## 2020-05-09 NOTE — Progress Notes (Signed)
Patient up to bedside commode this afternoon with difficulty passing stool. Patient required disimpaction of small amount of soft formed stool from rectum. Dulcolax suppository administered. On follow up patient up to bedside commode and again required disimpaction of soft formed medium amount of stool. Additional soft stool felt higher up rectum. Pain noted with disimpaction and straining attempts. MD Posey Pronto notified. New orders received. Patient lying on side resting at this time. Continue to monitor.

## 2020-05-10 ENCOUNTER — Other Ambulatory Visit (HOSPITAL_COMMUNITY): Payer: Self-pay | Admitting: Physical Medicine and Rehabilitation

## 2020-05-10 LAB — CBC WITH DIFFERENTIAL/PLATELET
Abs Immature Granulocytes: 0.04 10*3/uL (ref 0.00–0.07)
Basophils Absolute: 0 10*3/uL (ref 0.0–0.1)
Basophils Relative: 0 %
Eosinophils Absolute: 0 10*3/uL (ref 0.0–0.5)
Eosinophils Relative: 0 %
HCT: 20.2 % — ABNORMAL LOW (ref 36.0–46.0)
Hemoglobin: 6.6 g/dL — CL (ref 12.0–15.0)
Immature Granulocytes: 1 %
Lymphocytes Relative: 42 %
Lymphs Abs: 3 10*3/uL (ref 0.7–4.0)
MCH: 29.6 pg (ref 26.0–34.0)
MCHC: 32.7 g/dL (ref 30.0–36.0)
MCV: 90.6 fL (ref 80.0–100.0)
Monocytes Absolute: 0.5 10*3/uL (ref 0.1–1.0)
Monocytes Relative: 6 %
Neutro Abs: 3.6 10*3/uL (ref 1.7–7.7)
Neutrophils Relative %: 51 %
Platelets: 123 10*3/uL — ABNORMAL LOW (ref 150–400)
RBC: 2.23 MIL/uL — ABNORMAL LOW (ref 3.87–5.11)
RDW: 17.2 % — ABNORMAL HIGH (ref 11.5–15.5)
WBC: 7.1 10*3/uL (ref 4.0–10.5)
nRBC: 0 % (ref 0.0–0.2)

## 2020-05-10 LAB — BASIC METABOLIC PANEL
Anion gap: 9 (ref 5–15)
BUN: 32 mg/dL — ABNORMAL HIGH (ref 6–20)
CO2: 24 mmol/L (ref 22–32)
Calcium: 8.4 mg/dL — ABNORMAL LOW (ref 8.9–10.3)
Chloride: 101 mmol/L (ref 98–111)
Creatinine, Ser: 2.61 mg/dL — ABNORMAL HIGH (ref 0.44–1.00)
GFR, Estimated: 21 mL/min — ABNORMAL LOW (ref >60)
Glucose, Bld: 84 mg/dL (ref 70–99)
Potassium: 4.5 mmol/L (ref 3.5–5.1)
Sodium: 134 mmol/L — ABNORMAL LOW (ref 135–145)

## 2020-05-10 LAB — AEROBIC/ANAEROBIC CULTURE W GRAM STAIN (SURGICAL/DEEP WOUND): Gram Stain: NONE SEEN

## 2020-05-10 MED ORDER — TEDIZOLID PHOSPHATE 200 MG PO TABS
200.0000 mg | ORAL_TABLET | Freq: Every day | ORAL | 0 refills | Status: DC
Start: 2020-05-17 — End: 2020-05-17

## 2020-05-10 MED ORDER — PROMETHAZINE HCL 12.5 MG PO TABS: 12.5000 mg | ORAL_TABLET | Freq: Four times a day (QID) | ORAL | Status: DC | PRN

## 2020-05-10 MED FILL — SIVEXTRO 200 MG TABS: 200 | 30 days supply | Qty: 30 | Fill #0

## 2020-05-10 NOTE — Progress Notes (Signed)
Occupational Therapy Session Note  Patient Details  Name: Christina Mack MRN: [id number] Date of Birth: January 23, 1966  Today's Date: 05/10/2020 OT Individual Time: 1359-1458 OT Individual Time Calculation (min): 59 min    Short Term Goals: Week 1:  OT Short Term Goal 1 (Week 1): patient will complete bed mobility with CS using assistive devices as needed OT Short Term Goal 2 (Week 1): patient will complete SPT with RW CS level OT Short Term Goal 3 (Week 1): patient will complete lower body adl min A with assistive devices   Skilled Therapeutic Interventions/Progress Updates:    Pt greeted at time of session supine in bed resting with NT checking vitals, WNL. Pt with reports of discomfort/pain in groin area which has been consistent with this pt and her diagnosis, rest breaks and repositioning PRN. Supine > sit Min A, with pt feeling dizzy and with ankle pumps and extended time sitting EOB symptoms improved. Ambulated bed > sink Min A with RW, performed UB/LB bathing seated in wheelchair with Mod A for LB to reach feet thoroughly, declined washing buttocks as she said she had already done so earlier today. Pt has to lean back further in TIS to prevent pressure on wounds so would need items handed to her at times. UB dress set up. Walked back to bed in same manner as above, sit > supine Min A for BLE management and in side lying noted drainage from peri wounds. Changed mesh underwear with new pad Mod A at bed level, bridging to don/doff. Assist to clean periarea with baby wipe for thoroughness in side lying. Pt also provided padded TTB after discussion with morning OT to find a better position for toileting. Pt did not need to use at this time, but it is in her room for future use to trial instead of bed pan. Pt in bed supine with call bell in reach.    Therapy Documentation Precautions:  Precautions Precautions: Fall Precaution Comments: recent falls, wounds Restrictions Weight Bearing  Restrictions: No    Therapy/Group: Individual Therapy  Viona Gilmore 05/10/2020, 12:53 PM

## 2020-05-10 NOTE — Significant Event (Signed)
CRITICAL VALUE ALERT  Critical Value:  HGB 6.6  Date & Time Notied:  05/10/20 0630  Provider Notified: Linna Hoff, PA  Orders Received/Actions taken: No new orders at this time.

## 2020-05-10 NOTE — Progress Notes (Signed)
Physical Therapy Session Note  Patient Details  Name: Christina Mack MRN: [id number] Date of Birth: June 08, 1965  Today's Date: 05/10/2020 PT Individual Time: 4332-9518 PT Individual Time Calculation (min): 55 min   Short Term Goals: Week 1:  PT Short Term Goal 1 (Week 1): pt to demonstrate supine<>sit CGA PT Short Term Goal 2 (Week 1): pt to demonstrate functional transfers CGA PT Short Term Goal 3 (Week 1): pt to demonstrate ambulation 77' CGA with LRAD PT Short Term Goal 4 (Week 1): pt to tolerate OOB positioning for 45 mins  Skilled Therapeutic Interventions/Progress Updates:    Pt received seated in bed, agreeable to PT session. No complaints of pain and pt reports improvement in nausea from earlier this date. Pt reports urge to use the bathroom. Supine to sit with Supervision with HOB elevated and use of bedrail. Sit to stand with CGA to RW. Stand pivot transfer to padded BSC with RW and CGA. Pt able to continently void while seated on BSC, see Flowsheet for details. Pt requires assist for pericare for thoroughness. Pt returned to bed with mod A needed for BLE management for sit to supine. Changed out mepilex on sacral wound due to dressing becoming soiled while toileting. Pt declines any seated or further standing activity this session. Pt agreeable to bed level exercises. Supine BLE strengthening therex: heel slides, bridges, hip abd, SKFO 2 x 10 reps each. Sidelying hip abd 3 x 10 reps each. AAROM needed for heel slides and hip abd LLE>RLE. Pt left semi-reclined in bed with needs in reach at end of session.  Therapy Documentation Precautions:  Precautions Precautions: Fall Precaution Comments: recent falls, wounds Restrictions Weight Bearing Restrictions: No   Therapy/Group: Individual Therapy   Excell Seltzer, PT, DPT  05/10/2020, 5:16 PM

## 2020-05-10 NOTE — Progress Notes (Signed)
[place] PHYSICAL MEDICINE & REHABILITATION PROGRESS NOTE   Subjective/Complaints:  Pt reports had a significant amount of bleeding with BM/disimpaction.  LBM yesterday.  Nausea/vomiting x 2 days with breakfast due to Flagyl; feels fines so far, but hasn't taken Flagyl yet this AM- didn't think Zofran made much of a difference last 2 days.   Sleepy- tired of being in hospital/stressed over it.  Bending L knee a little better.   Doesn't want to get transfusion yet- specifically explained, wants to recheck labs IN AM (not now) and if still positive, will get transfusion.    ROS:  Pt denies SOB, abd pain, CP, C/D, and vision changes   Objective:   No results found. Recent Labs    05/10/20 0559  WBC 7.1  HGB 6.6*  HCT 20.2*  PLT 123*   Recent Labs    05/10/20 0559  NA 134*  K 4.5  CL 101  CO2 24  GLUCOSE 84  BUN 32*  CREATININE 2.61*  CALCIUM 8.4*    Intake/Output Summary (Last 24 hours) at 05/10/2020 0850 Last data filed at 05/10/2020 0400 Gross per 24 hour  Intake 700.12 ml  Output -  Net 700.12 ml     Pressure Injury 04/20/20 Buttocks Medial;Lower;Distal Stage 2 -  Partial thickness loss of dermis presenting as a shallow open injury with a red, pink wound bed without slough. weeping and scattered on the sacrum (Active)  04/20/20 2234  Location: Buttocks  Location Orientation: Medial;Lower;Distal  Staging: Stage 2 -  Partial thickness loss of dermis presenting as a shallow open injury with a red, pink wound bed without slough.  Wound Description (Comments): weeping and scattered on the sacrum  Present on Admission: Yes    Physical Exam: Vital Signs Blood pressure 130/90, pulse (!) 109, temperature 98.7 F (37.1 C), temperature source Oral, resp. rate 18, height 5\' 5"  (1.651 m), weight 48.1 kg, SpO2 99 %. Constitutional: depressed affect, laying supine in bed; appropriate, NAD HENT: Normocephalic.  Atraumatic.very pale conjunctivae B/L Eyes: EOMI. No  discharge. Cardiovascular: tachycardic- regular rhythm Respiratory: CTA B/L- no W/R/R- good air movement GI: N soft, NT, ND, very hyperactive BS Skin: Warm and dry.   Scattered healing hematomas Sacral ulcer not examined today Psych: flat, depressed affect Musc: No edema in extremities.  No tenderness in extremities. Neuro: Alert Motor: Bilateral upper extremities: 5/5 proximal distal Right lower extremity: Hip flexion, knee extension 2+/5, ankle dorsiflexion 5/5 Left lower extremity: Hip flexion, knee extension 4 -/5, ankle dorsiflexion 5/5 Station intact light touch    Assessment/Plan: 1. Functional deficits which require 3+ hours per day of interdisciplinary therapy in a comprehensive inpatient rehab setting.  Physiatrist is providing close team supervision and 24 hour management of active medical problems listed below.  Physiatrist and rehab team continue to assess barriers to discharge/monitor patient progress toward functional and medical goals  Care Tool:  Bathing    Body parts bathed by patient: Right arm,Left arm,Chest,Abdomen,Front perineal area,Buttocks,Right upper leg,Left upper leg,Face   Body parts bathed by helper: Right lower leg,Left lower leg,Buttocks     Bathing assist Assist Level: Moderate Assistance - Patient 50 - 74%     Upper Body Dressing/Undressing Upper body dressing   What is the patient wearing?: Pull over shirt    Upper body assist Assist Level: Set up assist    Lower Body Dressing/Undressing Lower body dressing    Lower body dressing activity did not occur: Refused What is the patient wearing?: Underwear/pull up,Pants  Lower body assist Assist for lower body dressing: Independent     Toileting Toileting    Toileting assist Assist for toileting: Set up assist Assistive Device Comment: Bedpan   Transfers Chair/bed transfer  Transfers assist     Chair/bed transfer assist level: Minimal Assistance - Patient > 75%      Locomotion Ambulation   Ambulation assist      Assist level: Minimal Assistance - Patient > 75% Assistive device: Walker-rolling Max distance: 25   Walk 10 feet activity   Assist     Assist level: Minimal Assistance - Patient > 75% Assistive device: Walker-rolling   Walk 50 feet activity   Assist    Assist level: Minimal Assistance - Patient > 75% Assistive device: Walker-rolling    Walk 150 feet activity   Assist Walk 150 feet activity did not occur: Safety/medical concerns         Walk 10 feet on uneven surface  activity   Assist Walk 10 feet on uneven surfaces activity did not occur: Safety/medical concerns         Wheelchair     Assist Will patient use wheelchair at discharge?: Yes (pt may benefit from from [facility] for home pending progression, unable to assess on eval 2/2 pt's inability to tolerate sitting in WC 2/2 pain)   Wheelchair activity did not occur: Safety/medical concerns         Wheelchair 50 feet with 2 turns activity    Assist    Wheelchair 50 feet with 2 turns activity did not occur: Safety/medical concerns       Wheelchair 150 feet activity     Assist  Wheelchair 150 feet activity did not occur: Safety/medical concerns       Blood pressure 130/90, pulse (!) 109, temperature 98.7 F (37.1 C), temperature source Oral, resp. rate 18, height 5\' 5"  (1.651 m), weight 48.1 kg, SpO2 99 %.  Medical Problem List and Plan: 1.  Deficits with balance, standing, mobility, self-care secondary to debility.            Continue CIR 2. H/o DVT/ Antithrombotics: -DVT/anticoagulation:  Pharmaceutical: Other (comment)--low dose eliquis due to nose bleeds.              -antiplatelet therapy: N/A 3. Chronic pain Management:  Methadone --2.5 mg bid since radiation completed. Continue Tylenol tid                Monitor with increased exertion  2/3- pt also using voltaren gel for stiffness/pain- pain stable per pt- will wait to  increase/change meds- con't to monitor  Controlled with meds on 2/52/7- pt feels pain "controlled"- con't regimen LCSW to follow for evaluation and support.              -antipsychotic agents: N/A 5. Neuropsych: This patient is capable of making decisions on her own behalf. 6. Skin/Wound Care: Stage II sacral decub and R labial wounds.              - sacral decub treated with santyl and wet to dry dressing.   Ordered air mattress             - Right labia-->calcium alginate with dry dressing daily. 7. Fluids/Electrolytes/Nutrition: Monitor I/Os. Continue supplements--reports that intake improving.   8. 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 Higher dose Daptomycin-->Tedezolid thorough 3/16.  Flagyl/Cipro thorough 2/26 to complete course of 6 total weeks  Weekly CK, CBC, BMP  2/7- getting severe nausea/vomiting from Flagyl- added Zofran scheduled in AM- seems to help to eat, THEN take Flagyl? 9. Thrombocytopenia: Platelets 120 (baseline)             Platelets 111 on 2/4, labs ordered for Monday  2/7- Plts 123k- con't regimen 10. Candida esophagitis: On Diflucan thorough 2/12 to compl 11. Gastric ulcer: To continue protonix bid X 6 weeks.              --Continue to monitor H/H with serial checks.              --transfuse prn hgb< 7.0              See #12 12. Acute on chronic anemia:  Transfused 1/21 and 2/1 with improvement.              Hemoglobin 7.6 on 7/4, labs ordered for Monday  2/7- Hb 6.6- pt wants to recheck and THEN transfuse tomorrow if still low 13. CKD s/p renal transplant:             Creatinine 2.54 on 2/4, labs ordered for Monday  2/7- Cr 2.61- slowly creeping up- con't to push PO fluids-  14. Nausea/Vomiting  Scheduled Zofran 4 mg at 0630 every day to hopefully prevent nausea/vomting.  2/7- will make sure has phenergan prn.     LOS: 5  days A FACE TO FACE EVALUATION WAS PERFORMED  Jolee Critcher 05/10/2020, 8:50 AM

## 2020-05-10 NOTE — Progress Notes (Signed)
Physical Therapy Session Note  Patient Details  Name: Christina Mack MRN: [id number] Date of Birth: Oct 08, 1965  Today's Date: 05/10/2020 PT Individual Time: 1300-1340 PT Individual Time Calculation (min): 40 min   Short Term Goals: Week 1:  PT Short Term Goal 1 (Week 1): pt to demonstrate supine<>sit CGA PT Short Term Goal 2 (Week 1): pt to demonstrate functional transfers CGA PT Short Term Goal 3 (Week 1): pt to demonstrate ambulation 34' CGA with LRAD PT Short Term Goal 4 (Week 1): pt to tolerate OOB positioning for 45 mins  Skilled Therapeutic Interventions/Progress Updates:    pt received in bed and agreeable to therapy denied pain, but requested to remain in bed for treatment session 2/2 increased nausea and dry heaving with all activity. Pt directed in semi recliner bed exercises with 2# arm bar of chest press, front raise, bicep curls, and shoulder adduction/abduction 2x15. Pt then directed in BLE strengthening exercises 2x10 of heel slides, bridges, hip abduction/adduction, knee to chest. Pt required extra time overall to complete 2/2 fatigue and time for nausea to decrease. PT attempted to direct pt in supine to sit and standing activity for additional PT training however pt declined reported pain with BLE mobility but did not rank and requested to end therapy session early. Pt educated on importance of OOB mobility, increasing tolerance to activity, and overall mood. Pt reported understanding and agrees but declined until next session. Pt very pleasant and reported she had additional exercises she does in bed for further mobility. Pt left in supine, All needs in reach and in good condition. Call light in hand.  Pt missed 20 mins PT.   Therapy Documentation Precautions:  Precautions Precautions: Fall Precaution Comments: recent falls, wounds Restrictions Weight Bearing Restrictions: No General: PT Amount of Missed Time (min): 20 Minutes PT Missed Treatment Reason: Patient ill  (Comment);Pain (nausea and dry heaving limiting pt's ability to participate in PT session) Vital Signs: Therapy Vitals Temp: 99.1 F (37.3 C) Temp Source: Oral Pulse Rate: (!) 108 Resp: 17 BP: (!) 122/93 Patient Position (if appropriate): Lying Oxygen Therapy SpO2: 100 % O2 Device: Room Air Pain:   Mobility:   Locomotion :    Trunk/Postural Assessment :    Balance:   Exercises:   Other Treatments:      Therapy/Group: Individual Therapy  Junie Panning 05/10/2020, 2:32 PM

## 2020-05-11 DIAGNOSIS — Z515 Encounter for palliative care: Secondary | ICD-10-CM

## 2020-05-11 LAB — CBC WITH DIFFERENTIAL/PLATELET
Abs Immature Granulocytes: 0.03 10*3/uL (ref 0.00–0.07)
Basophils Absolute: 0 10*3/uL (ref 0.0–0.1)
Basophils Relative: 0 %
Eosinophils Absolute: 0 10*3/uL (ref 0.0–0.5)
Eosinophils Relative: 0 %
HCT: 20.5 % — ABNORMAL LOW (ref 36.0–46.0)
Hemoglobin: 6.3 g/dL — CL (ref 12.0–15.0)
Immature Granulocytes: 0 %
Lymphocytes Relative: 45 %
Lymphs Abs: 3.1 10*3/uL (ref 0.7–4.0)
MCH: 28.5 pg (ref 26.0–34.0)
MCHC: 30.7 g/dL (ref 30.0–36.0)
MCV: 92.8 fL (ref 80.0–100.0)
Monocytes Absolute: 0.5 10*3/uL (ref 0.1–1.0)
Monocytes Relative: 7 %
Neutro Abs: 3.2 10*3/uL (ref 1.7–7.7)
Neutrophils Relative %: 48 %
Platelets: 123 10*3/uL — ABNORMAL LOW (ref 150–400)
RBC: 2.21 MIL/uL — ABNORMAL LOW (ref 3.87–5.11)
RDW: 17.3 % — ABNORMAL HIGH (ref 11.5–15.5)
WBC: 6.8 10*3/uL (ref 4.0–10.5)
nRBC: 0 % (ref 0.0–0.2)

## 2020-05-11 LAB — PREPARE RBC (CROSSMATCH)

## 2020-05-11 LAB — HEMOGLOBIN AND HEMATOCRIT, BLOOD
HCT: 25.6 % — ABNORMAL LOW (ref 36.0–46.0)
Hemoglobin: 8.2 g/dL — ABNORMAL LOW (ref 12.0–15.0)

## 2020-05-11 MED ORDER — DIPHENHYDRAMINE HCL 25 MG PO CAPS
25.0000 mg | ORAL_CAPSULE | Freq: Once | ORAL | Status: AC
  Administered 2020-05-11: 25 mg via ORAL
  Filled 2020-05-11: qty 1

## 2020-05-11 MED ORDER — ACETAMINOPHEN 325 MG PO TABS
650.0000 mg | ORAL_TABLET | Freq: Once | ORAL | Status: AC
  Administered 2020-05-11: 650 mg via ORAL
  Filled 2020-05-11: qty 2

## 2020-05-11 MED ORDER — SODIUM CHLORIDE 0.9% IV SOLUTION: Freq: Once | INTRAVENOUS | Status: AC

## 2020-05-11 MED ORDER — ONDANSETRON HCL 4 MG/2ML IJ SOLN: 4.0000 mg | Freq: Three times a day (TID) | INTRAMUSCULAR | Status: DC | PRN

## 2020-05-11 MED ORDER — ONDANSETRON HCL 4 MG PO TABS
4.0000 mg | ORAL_TABLET | Freq: Three times a day (TID) | ORAL | Status: DC
  Administered 2020-05-11 – 2020-05-17 (×22): 4 mg via ORAL
  Filled 2020-05-11 (×25): qty 1

## 2020-05-11 MED ORDER — FUROSEMIDE 10 MG/ML IJ SOLN
20.0000 mg | Freq: Once | INTRAMUSCULAR | Status: AC
  Administered 2020-05-11: 20 mg via INTRAVENOUS
  Filled 2020-05-11: qty 2

## 2020-05-11 NOTE — Progress Notes (Addendum)
Husband in to visit pt. Pt reminded to wear gown and glove due to pt contact precautions. Explained location of visitor bathroom and bathroom in room is for pt only. Reinforced mask policy with family and pt family upset that "nobody else has enforced this rule". [place] notified. Sheela Stack, LPN

## 2020-05-11 NOTE — Progress Notes (Signed)
Occupational Therapy Session Note  Patient Details  Name: Christina Mack MRN: [id number] Date of Birth: May 20, 1965  Today's Date: 05/11/2020 OT Individual Time: 7871-8367 OT Individual Time Calculation (min): 72 min    Short Term Goals: Week 1:  OT Short Term Goal 1 (Week 1): patient will complete bed mobility with CS using assistive devices as needed OT Short Term Goal 2 (Week 1): patient will complete SPT with RW CS level OT Short Term Goal 3 (Week 1): patient will complete lower body adl min A with assistive devices   Skilled Therapeutic Interventions/Progress Updates:    Pt greeted at time of session supine in bed with phlebotomy team finishing taking sample, agreeable to OT session despite reports of feeling fatigued. Pt had already completed ADL this am, but did want to wash her hair. Supine > sit Supervision with bed features and extended time, sit to stand Min/CGA at RW before walking to sink CGA. Pt positioned for comfort in TIS d/t pain in groin from upright and forward sitting, hair washed at sink level for comfort and grooming tasks. Pt assisted with grooming and drying after washing. Pt transported room > gym dependent, performed 2 sets of 15-20 reps of BUE there ex with 2# dowel, chest press, overhead press, bicep curl, FWD/backward circles, and lateral leans with brief rest breaks. Trialed weighted 1kg ball core strengthening but terminated as this was awkward in TIS. Switched to overhead ball toss with large therapy ball 2x15 to improve UB strength and core. Returned to room in same manner as above, walked short distance to bed CGA and sit to supine Min/Mod. Call bell in reach all needs met. Note she did need frequent repositioning in TIS throughout session for comfort.    Therapy Documentation Precautions:  Precautions Precautions: Fall Precaution Comments: recent falls, wounds Restrictions Weight Bearing Restrictions: No     Therapy/Group: Individual Therapy  Viona Gilmore 05/11/2020, 11:01 AM

## 2020-05-11 NOTE — Progress Notes (Signed)
Patient ID: Christina Mack, female   DOB: Sep 06, 1965, 55 y.o.   MRN: [id number]  Medical records reviewed   This NP visited patient at the bedside as a follow up for palliative medicine needs and emotional support.  Today is day 6 of patient's inpatient rehab stay.  I initially met patient on April 22, 2020 when Ms. Crandle was admitted inpatient.  Patient tells me that she feels like she is well and likely she will be discharged home next week.  Attempted to explore the patient's thoughts on her anticipated care needs at home, she is brief and letting me know that her husband and I believe cousin will be her main support people.  She does have a daughter who lives about 30 minutes away.  Discussed with patient the importance of continued conversation with her family and the  medical providers regarding overall plan of care and treatment options,  ensuring decisions are within the context of the patients values and GOCs.    Education offered regarding the hard decisions people are faced with in the light of serious illness.  Education offered on the importance of documentation of HPOA and ACP documents.   She acknowledges this information but offers no conversation around these concepts.  Questions and concerns addressed.       Total time spent on the unit was 25 minutes   PMT will continue to support holistically  Greater than 50% of the time was spent in counseling and coordination of care  Wadie Lessen NP  Palliative Medicine Team Team Phone # [phone] Pager [phone]

## 2020-05-11 NOTE — Progress Notes (Signed)
Foam dressings changed to sacrum x2 due to being soiled after BM/Voiding. Pt c/o of 10/10 pain on and around wounds. Wounds cleaned, Santyl applied w/foam.

## 2020-05-11 NOTE — Progress Notes (Signed)
[place] PHYSICAL MEDICINE & REHABILITATION PROGRESS NOTE   Subjective/Complaints:  Pt said feels Blah! Really tired and heart rat eis higher- Hb 6.3 today- needs transfusion.   LBM last night- required dig stim/manual disimpaction last night with suppository-   Nausea is better per pt last 24 hours-  However per chart, had dry heaves yesterday afternoon.   Will scheduled zofran.   ROS:   Pt denies SOB, abd pain, CP, N/V/C/D, and vision changes   Objective:   No results found. Recent Labs    05/10/20 0559 05/11/20 0450  WBC 7.1 6.8  HGB 6.6* 6.3*  HCT 20.2* 20.5*  PLT 123* 123*   Recent Labs    05/10/20 0559  NA 134*  K 4.5  CL 101  CO2 24  GLUCOSE 84  BUN 32*  CREATININE 2.61*  CALCIUM 8.4*    Intake/Output Summary (Last 24 hours) at 05/11/2020 0855 Last data filed at 05/11/2020 0734 Gross per 24 hour  Intake 680 ml  Output -  Net 680 ml     Pressure Injury 04/20/20 Buttocks Medial;Lower;Distal Stage 2 -  Partial thickness loss of dermis presenting as a shallow open injury with a red, pink wound bed without slough. weeping and scattered on the sacrum (Active)  04/20/20 2234  Location: Buttocks  Location Orientation: Medial;Lower;Distal  Staging: Stage 2 -  Partial thickness loss of dermis presenting as a shallow open injury with a red, pink wound bed without slough.  Wound Description (Comments): weeping and scattered on the sacrum  Present on Admission: Yes    Physical Exam: Vital Signs Blood pressure (!) 131/91, pulse (!) 103, temperature 98.5 F (36.9 C), temperature source Oral, resp. rate 18, height 5\' 5"  (1.651 m), weight 47.3 kg, SpO2 99 %. Constitutional: depressed affect, laying supine in bed; c/o "blah", NAD HENT: Normocephalic.  Atraumatic.very pale conjunctivae B/L-still Eyes: EOMI. No discharge. Cardiovascular: tachycardic- regular rhythm Respiratory: CTA B/L- no W/R/R- good air movement GI: Soft, NT, ND, (+)BS  Skin: Warm and dry.    Scattered healing hematomas Sacral ulcer not examined today Psych: flat, depressed affect Musc: No edema in extremities.  No tenderness in extremities. Neuro: Alert Motor: Bilateral upper extremities: 5/5 proximal distal Right lower extremity: Hip flexion, knee extension 2+/5, ankle dorsiflexion 5/5 Left lower extremity: Hip flexion, knee extension 4 -/5, ankle dorsiflexion 5/5 Station intact light touch    Assessment/Plan: 1. Functional deficits which require 3+ hours per day of interdisciplinary therapy in a comprehensive inpatient rehab setting.  Physiatrist is providing close team supervision and 24 hour management of active medical problems listed below.  Physiatrist and rehab team continue to assess barriers to discharge/monitor patient progress toward functional and medical goals  Care Tool:  Bathing    Body parts bathed by patient: Right arm,Left arm,Chest,Abdomen,Front perineal area,Buttocks,Right upper leg,Left upper leg,Face   Body parts bathed by helper: Right lower leg,Left lower leg,Buttocks     Bathing assist Assist Level: Moderate Assistance - Patient 50 - 74%     Upper Body Dressing/Undressing Upper body dressing   What is the patient wearing?: Pull over shirt    Upper body assist Assist Level: Set up assist    Lower Body Dressing/Undressing Lower body dressing    Lower body dressing activity did not occur: Refused What is the patient wearing?: Underwear/pull up     Lower body assist Assist for lower body dressing: Moderate Assistance - Patient 50 - 74%     Chartered loss adjuster  assist Assist for toileting: Set up assist Assistive Device Comment: Bedpan   Transfers Chair/bed transfer  Transfers assist     Chair/bed transfer assist level: Contact Guard/Touching assist     Locomotion Ambulation   Ambulation assist      Assist level: Minimal Assistance - Patient > 75% Assistive device: Walker-rolling Max distance: 25    Walk 10 feet activity   Assist     Assist level: Minimal Assistance - Patient > 75% Assistive device: Walker-rolling   Walk 50 feet activity   Assist    Assist level: Minimal Assistance - Patient > 75% Assistive device: Walker-rolling    Walk 150 feet activity   Assist Walk 150 feet activity did not occur: Safety/medical concerns         Walk 10 feet on uneven surface  activity   Assist Walk 10 feet on uneven surfaces activity did not occur: Safety/medical concerns         Wheelchair     Assist Will patient use wheelchair at discharge?: Yes (pt may benefit from from [facility] for home pending progression, unable to assess on eval 2/2 pt's inability to tolerate sitting in WC 2/2 pain)   Wheelchair activity did not occur: Safety/medical concerns         Wheelchair 50 feet with 2 turns activity    Assist    Wheelchair 50 feet with 2 turns activity did not occur: Safety/medical concerns       Wheelchair 150 feet activity     Assist  Wheelchair 150 feet activity did not occur: Safety/medical concerns       Blood pressure (!) 131/91, pulse (!) 103, temperature 98.5 F (36.9 C), temperature source Oral, resp. rate 18, height 5\' 5"  (1.651 m), weight 47.3 kg, SpO2 99 %.  Medical Problem List and Plan: 1.  Deficits with balance, standing, mobility, self-care secondary to debility.            Continue CIR 2. H/o DVT/ Antithrombotics: -DVT/anticoagulation:  Pharmaceutical: Other (comment)--low dose eliquis due to nose bleeds.              -antiplatelet therapy: N/A 3. Chronic pain Management:  Methadone --2.5 mg bid since radiation completed. Continue Tylenol tid                Monitor with increased exertion  2/3- pt also using voltaren gel for stiffness/pain- pain stable per pt- will wait to increase/change meds- con't to monitor  Controlled with meds on 2/5  2/7- pt feels pain "controlled"- con't regimen LCSW to follow for evaluation and  support.              -antipsychotic agents: N/A 5. Neuropsych: This patient is capable of making decisions on her own behalf. 6. Skin/Wound Care: Stage II sacral decub and R labial wounds.              - sacral decub treated with santyl and wet to dry dressing.   Ordered air mattress             - Right labia-->calcium alginate with dry dressing daily. 7. Fluids/Electrolytes/Nutrition: Monitor I/Os. Continue supplements--reports that intake improving.   8. 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 Higher dose Daptomycin-->Tedezolid thorough 3/16.              Flagyl/Cipro thorough 2/26 to complete course of 6 total weeks  Weekly  CK, CBC, BMP  2/7- getting severe nausea/vomiting from Flagyl- added Zofran scheduled in AM- seems to help to eat, THEN take Flagyl? 9. Thrombocytopenia: Platelets 120 (baseline)             Platelets 111 on 2/4, labs ordered for Monday  2/8- Plts stable at 123k 10. Candida esophagitis: On Diflucan thorough 2/12 to compl 11. Gastric ulcer: To continue protonix bid X 6 weeks.              --Continue to monitor H/H with serial checks.              --transfuse prn hgb< 7.0              See #12 12. Acute on chronic anemia:  Transfused 1/21 and 2/1 with improvement.              Hemoglobin 7.6 on 7/4, labs ordered for Monday  2/7- Hb 6.6- pt wants to recheck and THEN transfuse tomorrow if still low  2/8- Hb 6.3- will transfuse today 2 units 13. CKD s/p renal transplant:             Creatinine 2.54 on 2/4, labs ordered for Monday  2/7- Cr 2.61- slowly creeping up- con't to push PO fluids-  14. Nausea/Vomiting  Scheduled Zofran 4 mg at 0630 every day to hopefully prevent nausea/vomting.  2/7- will make sure has phenergan prn.  2/8- Zofran TID scheduled.      LOS: 6 days A FACE TO FACE EVALUATION WAS PERFORMED  Christina Mack 05/11/2020, 8:55 AM

## 2020-05-11 NOTE — Progress Notes (Addendum)
HGB 8.2 RN notified Christina Mack, Christina Mack vua telephone to report lab level. PA ordered RN to give the second unit of blood. RN notified blood bank. Blood band needs to get approval from director, will call RN once approved or declined. RN notified charge nurse Christina Mack. Pt stable at this time. RN will continue to monitor.  Blood bank informed RN that the director of the blood bank did not approve of the second unit of blood. RN notified charge nurse Christina Mack and Christina Mack, Christina Mack. No new orders given. Pt stable at this time. RN will continue to monitor.

## 2020-05-11 NOTE — Progress Notes (Signed)
Pt educated on blood transfusion and s/sx of reaction. Pt advised to inform of any noted reactions. Informed consent obtained. Pt has no further questions. Sheela Stack, LPN

## 2020-05-11 NOTE — Patient Care Conference (Signed)
Inpatient RehabilitationTeam Conference and Plan of Care Update Date: 05/11/2020   Time: 11:49 AM    Patient Name: Christina Mack      Medical Record Number: [id number]  Date of Birth: [birth year] Sex: Female         Room/Bed: 4M06C/4M06C-01 Payor Info: Payor: TRICARE / Plan: TRICARE EAST / Product Type: *No Product type* /    Admit Date/Time:  05/05/2020  4:03 PM  Primary Diagnosis:  [facility] Problems: Principal Problem:   Debility Active Problems:   Pressure injury of skin   Chronic kidney disease (CKD), stage IV (severe) (HCC)   Chronic pain syndrome   Cancer associated pain    Expected Discharge Date: Expected Discharge Date: 05/17/20  Team Members Present: Physician leading conference: Dr. Courtney Heys Care Coodinator Present: Dorthula Nettles, RN, BSN, CRRN;Becky Dupree, LCSW Nurse Present: Annita Brod, LPN PT Present: Stacy Gardner, PT OT Present: Lillia Corporal, OT PPS Coordinator present : Gunnar Fusi, SLP     Current Status/Progress Goal Weekly Team Focus  Bowel/Bladder   Pt is continent x2, LBM 05/10/20  Pt will remain cont  Q2h toileting   Swallow/Nutrition/ Hydration             ADL's   Mod overall LB bathe/dress (limited by positioning causing pain with reaching/forward), Min sit to stands, Min/CGA functional mobility/ADL transfers, very weak and deconditioned, limited by N/V recent sessions  bathing set up, LB dress Min, Supervision ADL transfers  sit to stands, standing balance/tolerance, functional mobility with RW, AE/DME as needed for wounds and comfort, positioning, generalized conditioning   Mobility   min A-CGA for bed mobility, transfers. min A-CGA for gait for stability with RW  supervision bed mob; min A stairs; CGA household gait 75'; mod I WC if needed  strengthening, tolerance to activity, balance, gait, transfers, stairs   Communication             Safety/Cognition/ Behavioral Observations            Pain   pt c/o vaginal/  buttocks/ Bilateral Leg Pain. PRN medications effective  Pt will be free of pain  Assess pain qshift   Skin   Bilateral bruising to arms, Multiple wounds from Vulvar Cancer, 1 cm skin tear to Lhand- foam pplied; Abrasions to sacrum- foams applied  Pt will be free from further breakdown or infection  Assess skin daILY/prn     Discharge Planning:  Home with husband who works but Architectural technologist can be with her while he is at work. Does own wound care and does not want IV antibiotics at home. Directs own care   Team Discussion: Transfusing blood today. Refusing to pack left groin wound. Experiencing pain with wounds. Continent B/B, but attempts to do the bedpan herself and ends up spilling the contents. Pain to bottom, groin area, and BLE. New skin tear to hand. Multiple abrasions to sacrum. Patient on target to meet rehab goals: Requires lots of rest breaks. Min-mod assist bathing and dressing. Min-contact guard for ambulation. Leaning forward causes pain in her labia/groin area. Her goals are supervision for household ambulation. She directs her care.   *See Care Plan and progress notes for long and short-term goals.   Revisions to Treatment Plan:  Added scheduled Zofran before receiving antibiotics.  Teaching Needs: Family education, medication management, skin/wound care, transfer training, gait training, pain management, endurance manangement  Current Barriers to Discharge: Inaccessible home environment, Decreased caregiver support, Home enviroment access/layout, Wound care, Lack of/limited family support,  Medication compliance, Pending chemo/radiation, Behavior and Nutritional means  Possible Resolutions to Barriers: Continue current medications, offer nutritional support, provide emotional support to patient and family.     Medical Summary Current Status: Won't allow packing of vulvar/labial ulcer/wound;   tries to go herself- conitnent technically B/B- LBM 2/7; pain with wounds; Hb 6.3- needs 2  units transfusion-  Barriers to Discharge: Wound care;Behavior;Decreased family/caregiver support;Home enviroment access/layout;Medical stability;Medication compliance;Weight;Other (comments);Pending chemo/radiation  Barriers to Discharge Comments: stage IV vulvar cancer and osteomyelitis; plan to go home with aunt/husband- still N/V with ABX- scheduled Zofran- - Hb 6.3! transfusion today Possible Resolutions to [employer]: lots of pain/breaks with therapy; OT- hurts to lean forward-  LB min-mod A; fatigues quickly- limited fatigue/pain per PT; 05/17/20 d/c   Continued Need for Acute Rehabilitation Level of Care: The patient requires daily medical management by a physician with specialized training in physical medicine and rehabilitation for the following reasons: Direction of a multidisciplinary physical rehabilitation program to maximize functional independence : Yes Medical management of patient stability for increased activity during participation in an intensive rehabilitation regime.: Yes Analysis of laboratory values and/or radiology reports with any subsequent need for medication adjustment and/or medical intervention. : Yes   I attest that I was present, lead the team conference, and concur with the assessment and plan of the team.   Cristi Loron 05/11/2020, 6:48 PM

## 2020-05-11 NOTE — Progress Notes (Addendum)
Pt IV flushing at this time.  1730: Pt completed first unit of blood. No complications noted. Lasix given.   Sheela Stack, LPN

## 2020-05-11 NOTE — Progress Notes (Signed)
Physical Therapy Session Note  Patient Details  Name: CLOA BUSHONG MRN: [id number] Date of Birth: 1965/11/08  Today's Date: 05/11/2020 PT Individual Time: 1411-1511 PT Individual Time Calculation (min): 60 min   Short Term Goals: Week 1:  PT Short Term Goal 1 (Week 1): pt to demonstrate supine<>sit CGA PT Short Term Goal 2 (Week 1): pt to demonstrate functional transfers CGA PT Short Term Goal 3 (Week 1): pt to demonstrate ambulation 34' CGA with LRAD PT Short Term Goal 4 (Week 1): pt to tolerate OOB positioning for 45 mins  Skilled Therapeutic Interventions/Progress Updates:    Caryl Pina, RN reports having started blood transfusion >10minutes ago and therefore pt is cleared to participate in therapy session - pt continuing to received transfusion during session. Pt received supine in bed and agreeable to bed level therapies. Reports need to use bathroom. In supine, doffed pants mod assist for removing from lower legs with pt demonstrating great initiate to complete as much as possible of her ADL tasks without assist. Rolling R/L relying heavily on bedrails mod-I while therapist placed bed pan. Pt completed peri-care with set-up assist and RN notified and present to change foam sacral dressing. Participated in the following B LE exercises in a circuit type fashion 2x15 reps each unless specified otherwise targeting LE strengthening and muscle endurance: - active assist heel slides - significant weakness in hip flexors (L>R) - bridging 2x20 reps - minimal hip clearance from bed - hip abduction/adduction active assist with pt demoing more impaired strength in adduction - short arc quads  - quad sets - clamshells  Cuing throughout for proper form/technique. Pt demos full ankle DF ROM - educated on importance of continuing to perform ankle DF/PF throughout the day. Pt left L sidelying in bed for sacral pressure relief with needs in reach and lines intact.   Therapy Documentation Precautions:   Precautions Precautions: Fall Precaution Comments: recent falls, wounds Restrictions Weight Bearing Restrictions: No  Pain: Reports some discomfort/pain during certain exercises towards end range when causing slight tension on peri-area wounds.   Therapy/Group: Individual Therapy  Tawana Scale , PT, DPT, CSRS  05/11/2020, 12:20 PM

## 2020-05-11 NOTE — Progress Notes (Signed)
Pt continues to tolerate transfusion. No adverse reactions noted. Pt resting comfortably. Sheela Stack, LPN

## 2020-05-11 NOTE — Progress Notes (Signed)
Pharmacy Antibiotic Note  Christina Mack is a 55 y.o. 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 Hospital course complicated by bactermeia due to bacteroides/eggerthella species. Pharmacy has been consulted for daptomycin dosing at higher dose of 10mg /kg. Patient has been approved for CIR and will begin IV antibiotic therapy while inpatient. Plan per ID is to transition to oral levaquin and flagyl at discharge and finish 6 weeks total of antibiotics on February 26th.   Patient continues to be in CIR - has been switched to oral Levaquin and Flagyl. Daptomycin dose still appropriate, CK from 2/3 is wnl. Patient remains afebrile and SCr is elevated at 2.61. Plan per ID note on 2/2 is to continue daptomycin while in CIR then finish out course with tedizolid through March 16th with [facility] follow up.   Plan: Continue daptomycin 500 mg IV q48h Monitor weekly CK (next on 2/10) Monitor renal function and adverse effects  Height: 5\' 5"  (165.1 cm) Weight: 47.3 kg (104 lb 4.4 oz) IBW/kg (Calculated) : 57  Temp (24hrs), Avg:98.9 F (37.2 C), Min:98.5 F (36.9 C), Max:99.1 F (37.3 C)  Recent Labs  Lab 05/05/20 0717 05/06/20 0441 05/07/20 0530 05/10/20 0559 05/11/20 0450  WBC 8.1 8.8 6.9 7.1 6.8  CREATININE 2.23* 2.58* 2.54* 2.61*  --     Estimated Creatinine Clearance: 18.6 mL/min (A) (by C-G formula based on SCr of 2.61 mg/dL (H)).    Allergies  Allergen Reactions  . Zosyn [Piperacillin Sod-Tazobactam So] Other (See Comments)    Thrombocytopenia  . Codeine     vomiting  . Hydromorphone Nausea And Vomiting  . Oxycodone-Acetaminophen Itching, Nausea Only, Other (See Comments) and Nausea And Vomiting    Other Reaction: HA    Antimicrobials this admission: Diflucan 1/22> Dapto 2/2>> Levaquin 2/2>> Flagyl 2/2>>  Dose adjustments this admission:   Microbiology results: 1/1 blood>>Bacteroides ovatus/xylanisolvens and Eggerthella  lenta can't see sens. 1/25 blood>>negF 1/28 abd>> enterococcus faecium (VRE)  Thank you for involving pharmacy in this patient's care.  Renold Genta, PharmD, BCPS Clinical Pharmacist Clinical phone for 05/11/2020 until 3p is [phone] 05/11/2020 12:36 PM  **Pharmacist phone directory can be found on [email or web address] listed under [place]**   Please check [email or web address] for unit-specific pharmacy phone numbers.

## 2020-05-11 NOTE — Progress Notes (Signed)
Pt refused the Am dose of Flagyl. Pt requested to have Medication at 8 am. Oncoming shift will be made aware.

## 2020-05-11 NOTE — Progress Notes (Signed)
Patient ID: Christina Mack, female   DOB: 1965/05/03, 55 y.o.   MRN: [id number]  Met with pt to discuss team conference goals of supervision to CGA level and her tendency to fluctuate depending upon how she was feeling. She is aware of this. [employer] discharge date of 2/14. When asked if wanted Aunt to come in for education prior to discharge pt reported she doesn;t like to drive here from [facility] and she could tell her what she needed to do for her. With her background as a PTA. Discussed any equipment needs she does need a BSC. Wanted to look into padded one due to helps with her wounds. Will search for one and give her the information in case wants to order one. Can not get one with insurance coverage. Pt was active with [facility] but only home two days so had not started. Will reach out to [facility] regarding informing of discharge and needs at home.

## 2020-05-11 NOTE — Progress Notes (Signed)
MD Lovorn aware of pt 6.3 hgb. Sheela Stack, LPN

## 2020-05-11 NOTE — Progress Notes (Signed)
Occupational Therapy Session Note  Patient Details  Name: Christina Mack MRN: [id number] Date of Birth: 28-Apr-1965  Today's Date: 05/11/2020 OT Individual Time: 0973-5329 OT Individual Time Calculation (min): 45 min    Short Term Goals: Week 1:  OT Short Term Goal 1 (Week 1): patient will complete bed mobility with CS using assistive devices as needed OT Short Term Goal 2 (Week 1): patient will complete SPT with RW CS level OT Short Term Goal 3 (Week 1): patient will complete lower body adl min A with assistive devices  Skilled Therapeutic Interventions/Progress Updates:    Patient in bed, alert and aware of needs.  She notes pain ongoing but under control at this time - nursing provided pain medication as I arrived.  H&H low - did not walk distances this session.  LB dressing bed level - mod A for pants and max/dep for slipper socks.  Side lying to sitting edge of bed with CS.  UB dressing with set up.  SPT with RW to/from bed and w/c with CGA.  Completed oral care w/c level with set up.  Sit to stand and standing weight shift, small squat exercises in stance with fair tolerance CGA.  Returned to bed level with CGA.   Supplies in reach and call bell in hand.  Therapy Documentation Precautions:  Precautions Precautions: Fall Precaution Comments: recent falls, wounds Restrictions Weight Bearing Restrictions: No   Therapy/Group: Individual Therapy  Carlos Levering 05/11/2020, 7:31 AM

## 2020-05-11 NOTE — Progress Notes (Signed)
No adverse reactions noted for first 15 minutes of transfusion. Rate continues at 172mL/hr. Pt resting comfortably, call light in reach.  Sheela Stack, LPN

## 2020-05-12 ENCOUNTER — Other Ambulatory Visit (HOSPITAL_COMMUNITY): Payer: Self-pay | Admitting: Physical Medicine and Rehabilitation

## 2020-05-12 LAB — BASIC METABOLIC PANEL
Anion gap: 11 (ref 5–15)
BUN: 30 mg/dL — ABNORMAL HIGH (ref 6–20)
CO2: 23 mmol/L (ref 22–32)
Calcium: 8.4 mg/dL — ABNORMAL LOW (ref 8.9–10.3)
Chloride: 98 mmol/L (ref 98–111)
Creatinine, Ser: 2.46 mg/dL — ABNORMAL HIGH (ref 0.44–1.00)
GFR, Estimated: 23 mL/min — ABNORMAL LOW (ref >60)
Glucose, Bld: 82 mg/dL (ref 70–99)
Potassium: 4.5 mmol/L (ref 3.5–5.1)
Sodium: 132 mmol/L — ABNORMAL LOW (ref 135–145)

## 2020-05-12 LAB — BPAM RBC
Blood Product Expiration Date: 202203082359
ISSUE DATE / TIME: 202202081311
Unit Type and Rh: 5100

## 2020-05-12 LAB — TYPE AND SCREEN
ABO/RH(D): O POS
Antibody Screen: NEGATIVE
Unit division: 0

## 2020-05-12 MED ORDER — METRONIDAZOLE 500 MG PO TABS: 500.0000 mg | ORAL_TABLET | Freq: Three times a day (TID) | ORAL | 0 refills | Status: DC

## 2020-05-12 MED ORDER — LEVOFLOXACIN 500 MG PO TABS: 500.0000 mg | ORAL_TABLET | ORAL | 0 refills | Status: DC

## 2020-05-12 MED ORDER — NON FORMULARY: 60.0000 mg | Freq: Two times a day (BID) | Status: DC

## 2020-05-12 MED ORDER — LORATADINE 10 MG PO TABS
10.0000 mg | ORAL_TABLET | Freq: Every day | ORAL | Status: DC
  Administered 2020-05-12 – 2020-05-17 (×6): 10 mg via ORAL
  Filled 2020-05-12 (×6): qty 1

## 2020-05-12 MED FILL — metroNIDAZOLE 500 MG TABS: 500 | 12 days supply | Qty: 36 | Fill #0

## 2020-05-12 MED FILL — levoFLOXacin 500 MG TABS: 500 | 12 days supply | Qty: 6 | Fill #0

## 2020-05-12 NOTE — Progress Notes (Signed)
Patient feels like right ear is stopped up--has fluid that clears up with head turn to the right. Exam reveals opaque ear drum--appears bulging. On pain or discomfort on earlobe, tragus or with exam. Canal clear. She has had congestion but do not want to use steroid nose spray due to recurrent nosebleed--last a couple of days ago. Does have allergies and used Zyal and allegra PTA--does not want to try low dose sudafed due to SE concerns. Will add allegra and discussed that should improve with time.   Also discussed upcoming d/c-->she is doing dressing changes herself with staff providing set up/dressings. She is agreeable to [facility] for follow up. Rx for Tedizolid already sent to [place] per ID and will send rest of Rx there for ease of d/c.

## 2020-05-12 NOTE — Progress Notes (Signed)
Physical Therapy Session Note  Patient Details  Name: Christina Mack MRN: [id number] Date of Birth: [birth year]  Today's Date: 05/12/2020 PT Individual Time: 0905-1005 PT Individual Time Calculation (min): 60 min   Short Term Goals: Week 1:  PT Short Term Goal 1 (Week 1): pt to demonstrate supine<>sit CGA PT Short Term Goal 2 (Week 1): pt to demonstrate functional transfers CGA PT Short Term Goal 3 (Week 1): pt to demonstrate ambulation 8' CGA with LRAD PT Short Term Goal 4 (Week 1): pt to tolerate OOB positioning for 45 mins  Skilled Therapeutic Interventions/Progress Updates: Pt presented in bed agreeable to therapy. PTA encouraged pt to attempt stairs this session however pt politely declined as she states she feels fatigued from OT session (ended 30 min prior). Pt agreeable to perform supine therex. Pt was able to reposition as needed throughout session mod I and boost to [facility] intermittently with PTA blocking B foot only. Pt performed therex as follows with intermittent rest breaks provided as needed. Hip abd/add 2 x 10, heel slides with PTA providing AA for LLE 2 x 10 bilaterally, SAQ with 1lb wt 2 x 10, AASLR from elevated surface 2 x 10, hamstring pulls with B feet on red physioball 2 x 10, manually resisted leg press 2 x 10. Rests provided intermittently due to pain at wound site and sacral area. Session ended with pt resting comfortably and needs met.      Therapy Documentation Precautions:  Precautions Precautions: Fall Precaution Comments: recent falls, wounds Restrictions Weight Bearing Restrictions: No General:   Vital Signs: Therapy Vitals Temp: 98 F (36.7 C) Temp Source: Oral Pulse Rate: 98 Resp: 18 BP: 108/80 Pain:   Mobility:   Locomotion :    Trunk/Postural Assessment :    Balance:   Exercises:   Other Treatments:      Therapy/Group: Individual Therapy  Enez Monahan 05/12/2020, 4:10 PM

## 2020-05-12 NOTE — Progress Notes (Signed)
[place] PHYSICAL MEDICINE & REHABILITATION PROGRESS NOTE   Subjective/Complaints:  Pt reports feeling much better this AM; takes Flagyl now after meals, not before Has diarrhea now- held senokot herself.   R ear stopped up- fluid moving in "there".   Hb up 8.2 s/p 1 unit pRBCs-  Cr down to 2.46    ROS:   Pt denies SOB, abd pain, CP, N/V/C/D, and vision changes     Objective:   No results found. Recent Labs    05/10/20 0559 05/11/20 0450 05/11/20 1921  WBC 7.1 6.8  --   HGB 6.6* 6.3* 8.2*  HCT 20.2* 20.5* 25.6*  PLT 123* 123*  --    Recent Labs    05/10/20 0559 05/12/20 0512  NA 134* 132*  K 4.5 4.5  CL 101 98  CO2 24 23  GLUCOSE 84 82  BUN 32* 30*  CREATININE 2.61* 2.46*  CALCIUM 8.4* 8.4*    Intake/Output Summary (Last 24 hours) at 05/12/2020 0841 Last data filed at 05/11/2020 1816 Gross per 24 hour  Intake 1093 ml  Output --  Net 1093 ml     Pressure Injury 04/20/20 Buttocks Medial;Lower;Distal Stage 2 -  Partial thickness loss of dermis presenting as a shallow open injury with a red, pink wound bed without slough. weeping and scattered on the sacrum (Active)  04/20/20 2234  Location: Buttocks  Location Orientation: Medial;Lower;Distal  Staging: Stage 2 -  Partial thickness loss of dermis presenting as a shallow open injury with a red, pink wound bed without slough.  Wound Description (Comments): weeping and scattered on the sacrum  Present on Admission: Yes    Physical Exam: Vital Signs Blood pressure (!) 141/101, pulse 97, temperature 98.8 F (37.1 C), temperature source Oral, resp. rate 20, height 5\' 5"  (1.651 m), weight 47.3 kg, SpO2 98 %. Constitutional: awake, alert, laying on side in bed, OT in room, NAD HENT: Normocephalic.  Atraumatic.less pale conjunctivae Eyes: EOMI. No discharge. Cardiovascular: borderline tachycardia- regular rhythm Respiratory: CTA B/L- no W/R/R- good air movement GI: Soft, NT, ND, (+)BS  Skin: Warm and dry.    Scattered healing hematomas valvur ulcer not assessed today; sacral stage II Psych: flat, depressed affect- slightly improved today Musc: No edema in extremities.  No tenderness in extremities. Neuro: Alert Motor: Bilateral upper extremities: 5/5 proximal distal Right lower extremity: Hip flexion, knee extension 2+/5, ankle dorsiflexion 5/5 Left lower extremity: Hip flexion, knee extension 4 -/5, ankle dorsiflexion 5/5 Station intact light touch    Assessment/Plan: 1. Functional deficits which require 3+ hours per day of interdisciplinary therapy in a comprehensive inpatient rehab setting.  Physiatrist is providing close team supervision and 24 hour management of active medical problems listed below.  Physiatrist and rehab team continue to assess barriers to discharge/monitor patient progress toward functional and medical goals  Care Tool:  Bathing    Body parts bathed by patient: Right arm,Left arm,Chest,Abdomen,Front perineal area,Buttocks,Right upper leg,Left upper leg,Face   Body parts bathed by helper: Right lower leg,Left lower leg,Buttocks     Bathing assist Assist Level: Moderate Assistance - Patient 50 - 74%     Upper Body Dressing/Undressing Upper body dressing   What is the patient wearing?: Pull over shirt    Upper body assist Assist Level: Set up assist    Lower Body Dressing/Undressing Lower body dressing    Lower body dressing activity did not occur: Refused What is the patient wearing?: Pants     Lower body assist Assist for  lower body dressing: Moderate Assistance - Patient 50 - 74%     Toileting Toileting    Toileting assist Assist for toileting: Set up assist Assistive Device Comment: Bedpan   Transfers Chair/bed transfer  Transfers assist     Chair/bed transfer assist level: Contact Guard/Touching assist     Locomotion Ambulation   Ambulation assist      Assist level: Minimal Assistance - Patient > 75% Assistive device:  Walker-rolling Max distance: 25   Walk 10 feet activity   Assist     Assist level: Minimal Assistance - Patient > 75% Assistive device: Walker-rolling   Walk 50 feet activity   Assist    Assist level: Minimal Assistance - Patient > 75% Assistive device: Walker-rolling    Walk 150 feet activity   Assist Walk 150 feet activity did not occur: Safety/medical concerns         Walk 10 feet on uneven surface  activity   Assist Walk 10 feet on uneven surfaces activity did not occur: Safety/medical concerns         Wheelchair     Assist Will patient use wheelchair at discharge?: Yes (pt may benefit from from [facility] for home pending progression, unable to assess on eval 2/2 pt's inability to tolerate sitting in WC 2/2 pain)   Wheelchair activity did not occur: Safety/medical concerns         Wheelchair 50 feet with 2 turns activity    Assist    Wheelchair 50 feet with 2 turns activity did not occur: Safety/medical concerns       Wheelchair 150 feet activity     Assist  Wheelchair 150 feet activity did not occur: Safety/medical concerns       Blood pressure (!) 141/101, pulse 97, temperature 98.8 F (37.1 C), temperature source Oral, resp. rate 20, height 5\' 5"  (1.651 m), weight 47.3 kg, SpO2 98 %.  Medical Problem List and Plan: 1.  Deficits with balance, standing, mobility, self-care secondary to debility.            Continue CIR 2. H/o DVT/ Antithrombotics: -DVT/anticoagulation:  Pharmaceutical: Other (comment)--low dose eliquis due to nose bleeds.              -antiplatelet therapy: N/A 3. Chronic pain Management:  Methadone --2.5 mg bid since radiation completed. Continue Tylenol tid                Monitor with increased exertion  2/3- pt also using voltaren gel for stiffness/pain- pain stable per pt- will wait to increase/change meds- con't to monitor  Controlled with meds on 2/5  2/9- per therapy, pain limiting therapy, but pt doesn't  want more meds- con't regimen LCSW to follow for evaluation and support.              -antipsychotic agents: N/A 5. Neuropsych: This patient is capable of making decisions on her own behalf. 6. Skin/Wound Care: Stage II sacral decub and R labial wounds.              - sacral decub treated with santyl and wet to dry dressing.   Ordered air mattress             - Right labia-->calcium alginate with dry dressing daily.  2/9- pt refusing packing of vulvar wound- con't dressings as able 7. Fluids/Electrolytes/Nutrition: Monitor I/Os. Continue supplements--reports that intake improving.   8. 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 Higher dose Daptomycin-->Tedezolid thorough 3/16.              Flagyl/Cipro thorough 2/26 to complete course of 6 total weeks  Weekly CK, CBC, BMP  2/7- getting severe nausea/vomiting from Flagyl- added Zofran scheduled in AM- seems to help to eat, THEN take Flagyl?  2/9- doing better with nausea/vomiting by taking flagyl after meals and scheduled zofran with meals 9. Thrombocytopenia: Platelets 120 (baseline)             Platelets 111 on 2/4, labs ordered for Monday  2/8- Plts stable at 123k 10. Candida esophagitis: On Diflucan thorough 2/12 to compl 11. Gastric ulcer: To continue protonix bid X 6 weeks.              --Continue to monitor H/H with serial checks.              --transfuse prn hgb< 7.0              See #12 12. Acute on chronic anemia:  Transfused 1/21 and 2/1 with improvement.              Hemoglobin 7.6 on 7/4, labs ordered for Monday  2/7- Hb 6.6- pt wants to recheck and THEN transfuse tomorrow if still low  2/8- Hb 6.3- will transfuse today 2 units  2/9- Hb 8.2- up from 1 unit pRBCs- cannot get another per blood director 13. CKD s/p renal transplant:             Creatinine 2.54 on 2/4, labs ordered for Monday  2/7- Cr 2.61- slowly  creeping up- con't to push PO fluids-   2/9- Cr slightly better- 2.46- con't to push fluids 14. Nausea/Vomiting  Scheduled Zofran 4 mg at 0630 every day to hopefully prevent nausea/vomting.  2/7- will make sure has phenergan prn.  2/8- Zofran TID scheduled.  2/9- better- con't regimen      LOS: 7 days A FACE TO FACE EVALUATION WAS PERFORMED  Gorje Iyer 05/12/2020, 8:41 AM

## 2020-05-12 NOTE — Progress Notes (Signed)
Physical Therapy Session Note  Patient Details  Name: Christina Mack MRN: [id number] Date of Birth: 02/22/1966  Today's Date: 05/12/2020 PT Individual Time: 1302-1418 PT Individual Time Calculation (min): 76 min   Short Term Goals: Week 1:  PT Short Term Goal 1 (Week 1): pt to demonstrate supine<>sit CGA PT Short Term Goal 2 (Week 1): pt to demonstrate functional transfers CGA PT Short Term Goal 3 (Week 1): pt to demonstrate ambulation 31' CGA with LRAD PT Short Term Goal 4 (Week 1): pt to tolerate OOB positioning for 45 mins  Skilled Therapeutic Interventions/Progress Updates:    Patient supine in bed upon PT arrival. Patient alert and agreeable to PT session. Patient does demonstrate pain intermittently throughout session with pressure to sacral wound that is alleviated with postural changes. Relates that she did not sleep well the previous night but has just had a 3 hour nap.   Therapeutic Activity: Bed Mobility: Pt performed supine --> sit with Min A d/t weakness/ debility. Provided verbal cues for pushing up from bed surface. Sit--> sidelying L at end of session requires Min A for BLE.  Transfers: Patient performed STS throughout session with CGA. SPVT performed bed <> BSC using RW with CGA.   Gait Training:  Prior to walking, pt relates her personal hx of falls at home d/t weakness/ balance and how her current anxiety is tied to activity.  Patient ambulated 8 feet in room from [facility] to TIS w/c using RW with CGA. Ambulated with low but adequate step height, increased UE support into RW, and slow cadence.  Pt encouraged to attempt 6" step in therapy gym and agreeable. +2 brought for encouragement/ safety. Requested toe touches from pt, who started with x10 RLE toe touches. Progressed to LLE and performs x5 with good quality. Performs 2 more reps with decreasing height and slower performance before pt's anxiety rises and requests to sit "before she falls". Education provided to pt that  despite knowing that her LLE is her reported weaker LE, she chose to initiate a standing exercise with SLS on her weaker LE.   Progressed to perform x2 step ups to 6" steps. PT provides CGA for LLE knee block with +2 for balance/ weakness. VC for stepping up with RLE and back with LLE. On second step down, she starts to step back with RLE and requires vc/ tc for correction which she is able to perform but increases her anxiety and she does not want to continue. But is willing to move to 3" steps. At 3" steps with max encouragement, she performs step ups to one step with good quality and sequencing and encouraged without stopping to attempt 2 steps up before stepping back down and performs well again with good technique and sequencing.   During step performance, pt is educated in "belly breathing" technique to assist with managing anxiety. Cued for breathing throughout session.   Gait training facilitated in therapy gym using RW with CGA at hips for tactile feedback to therapist re: muscle activation and fatigue. +2 for w/c follow. Pt ambulates 16' prior to ending first bout impulsively and with increased fear and adamantly calling out for chair stating her "legs were shaking" despite showing no signs of muscle fatigue, no increase in sway. Education provided to pt re: what pt felt vs the steadiness that this PT noted from pt's performance. Encouraged her to attempt again hallway where she is able to reach 50' with similar quality of gait and no anxiety.  Patient sidelying to L in bed at end of session with brakes locked, bed alarm set, and all needs within reach. Pt seems encouraged by session.    Therapy Documentation Precautions:  Precautions Precautions: Fall Precaution Comments: recent falls, wounds Restrictions Weight Bearing Restrictions: No   Therapy/Group: Individual Therapy  Alger Simons 05/12/2020, 5:01 PM

## 2020-05-12 NOTE — Progress Notes (Signed)
Occupational Therapy Session Note  Patient Details  Name: Christina Mack MRN: [id number] Date of Birth: [birth year]  Today's Date: 05/12/2020 OT Individual Time: 0730-0826 OT Individual Time Calculation (min): 56 min    Short Term Goals: Week 1:  OT Short Term Goal 1 (Week 1): patient will complete bed mobility with CS using assistive devices as needed OT Short Term Goal 2 (Week 1): patient will complete SPT with RW CS level OT Short Term Goal 3 (Week 1): patient will complete lower body adl min A with assistive devices   Skilled Therapeutic Interventions/Progress Updates:    Pt greeted at time of session supine in bed resting, agreeable to OT session, just finishing with MD for morning rounds. Supine > sit Min A, stand pivot to [facility] CGA with RW, 3/3 toileting tasks with CGA able to manage mesh underwear and place new pad. Walked BSC > sink CGA and sat in TIS to perform oral hygiene and washing face angled at sink reclined in chair for comfort/pressure relief from wounds. Transported to gym for time management and performed endurance training with zoom ball 2 rounds of 1 minute intervals with mild c/o UE fatigue. Transported back to room same manner as above, walked short distance to bed CGA with RW and sit > supine Min A for LE management. Call bell in reach all needs met.   Therapy Documentation Precautions:  Precautions Precautions: Fall Precaution Comments: recent falls, wounds Restrictions Weight Bearing Restrictions: No      Therapy/Group: Individual Therapy  Viona Gilmore 05/12/2020, 12:27 PM

## 2020-05-13 ENCOUNTER — Ambulatory Visit: Admitting: Gynecologic Oncology

## 2020-05-13 LAB — CBC WITH DIFFERENTIAL/PLATELET
Abs Immature Granulocytes: 0.04 10*3/uL (ref 0.00–0.07)
Basophils Absolute: 0 10*3/uL (ref 0.0–0.1)
Basophils Relative: 0 %
Eosinophils Absolute: 0 10*3/uL (ref 0.0–0.5)
Eosinophils Relative: 0 %
HCT: 25 % — ABNORMAL LOW (ref 36.0–46.0)
Hemoglobin: 8.4 g/dL — ABNORMAL LOW (ref 12.0–15.0)
Immature Granulocytes: 1 %
Lymphocytes Relative: 47 %
Lymphs Abs: 3.3 10*3/uL (ref 0.7–4.0)
MCH: 30.1 pg (ref 26.0–34.0)
MCHC: 33.6 g/dL (ref 30.0–36.0)
MCV: 89.6 fL (ref 80.0–100.0)
Monocytes Absolute: 0.5 10*3/uL (ref 0.1–1.0)
Monocytes Relative: 7 %
Neutro Abs: 3.1 10*3/uL (ref 1.7–7.7)
Neutrophils Relative %: 45 %
Platelets: 134 10*3/uL — ABNORMAL LOW (ref 150–400)
RBC: 2.79 MIL/uL — ABNORMAL LOW (ref 3.87–5.11)
RDW: 16.9 % — ABNORMAL HIGH (ref 11.5–15.5)
WBC: 7 10*3/uL (ref 4.0–10.5)
nRBC: 0 % (ref 0.0–0.2)

## 2020-05-13 LAB — CK: Total CK: 22 U/L — ABNORMAL LOW (ref 38–234)

## 2020-05-13 LAB — C-REACTIVE PROTEIN: CRP: 4.9 mg/dL — ABNORMAL HIGH (ref <1.0)

## 2020-05-13 LAB — TACROLIMUS LEVEL: Tacrolimus (FK506) - LabCorp: 4.1 ng/mL (ref 2.0–20.0)

## 2020-05-13 MED ORDER — KATE FARMS STANDARD 1.4 PO LIQD
325.0000 mL | Freq: Two times a day (BID) | ORAL | Status: DC
  Administered 2020-05-13 – 2020-05-14 (×2): 325 mL via ORAL
  Filled 2020-05-13 (×9): qty 325

## 2020-05-13 NOTE — Evaluation (Signed)
Recreational Therapy Assessment and Plan  Patient Details  Name: Christina Mack MRN: [id number] Date of Birth: 1965-07-11 Today's Date: 05/13/2020  Rehab Potential:  Good ELOS:   d/c 2/14  Assessment  Hospital Problem: Principal Problem:   Debility Active Problems:   Pressure injury of skin   Past Medical History:      Past Medical History:  Diagnosis Date  . Complication of anesthesia    nausea and vomiting   Past Surgical History:       Past Surgical History:  Procedure Laterality Date  . BIOPSY  04/24/2020   Procedure: BIOPSY;  Surgeon: Doran Stabler, MD;  Location: [place];  Service: Gastroenterology;;  . ESOPHAGOGASTRODUODENOSCOPY (EGD) WITH PROPOFOL N/A 04/24/2020   Procedure: ESOPHAGOGASTRODUODENOSCOPY (EGD) WITH PROPOFOL;  Surgeon: Doran Stabler, MD;  Location: [place];  Service: Gastroenterology;  Laterality: N/A;  . IR US GUIDE BX ASP/DRAIN  04/30/2020    Assessment & Plan Clinical Impression: Patient is a 55 y.o. 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 History taken from chart review and patient. CT head done due to fall and showed tiny chronic left thalamic infarct and negative for acute changes. Patient declined EEG and jerking felt to be due to metabolic derangements. Nephrology consulted for input on progressive renal failure with failure to thrive and 50 lbs unintentional weight loss. N/V felt to be multifactorial, hemodialysis discussed (patient did not want to start HD) and medication changes recommended with involvement of palliative care to help determine GOC. Failing kidney felt to be due to sarcopenia and poor nutrition--as renal function stable, nephrology  has signed off with recommendations of IVF prn for hydration.   Dr. Pincus Sanes consulted due to ongoing nausea and vomiting (started last fall) and endoscopy done on 04/24/2020 revealing non bleeding gastric ulcer with esophageal plaques consistent with candidiasis. She was started on IV Diflucan X 2 weeks with recommendations for PPI X 6 weeks and cleared to resume [place]. She completed her antibiotic regimen on 01/22 and had recurrent shaking episode on 04/26/2020, questioned to be syncopal. ID recommended monitoring patient off antibiotics and question shaking episodes as rigors. MRI pelvis done 04/27/2020 for follow up and showed large area of ulceration with diffuse phlegmon, extensive myositis of bilateral adductor and anterior muscular compartment of both thighs, multilocular abscess in inferior left pelvis surrounding pubic rami and extending to upper left thigh as well as nondisplaced inferior right pubic fracture with suggestion of osteomyelitis involving pubic rami.   Dr. Delsa Sale consulted and recommended debridement and wound care. Plastics consulted for input and did not feel surgical debridement needed. She underwent CT guided biopsy of left abdominal wall on 04/22/2020. Hip aspirate grew out enterococcus faecium and daptomycin recommended -->patient agreeable to IV therapy in hospital--daptomycin higher dose and to transition to [facility] on outpatient basis for 6 week antibiotic therapy. Levaquin and metronidazole added back due to presumptive polymicrobial infection. She had drop in Hgb to 6.9 on 05/04/2020 and was transfused with one unit PRBC. She did develop nosebleed last evening and eliquis decreased to 2.5 mg daily. She has had issues with significant left hip pain with weakness as well as anxiety/fear of falls affecting mobility and ADLs. Therapy ongoing and currently working on balance and standing--has been  able to do some walking. CIR recommended due to functional decline. Please  see preadmission assessment from earlier today as well. Patient transferred to CIR on 05/05/2020 .   Pt presents with decreased activity tolerance, decreased functional mobility, decreased balance Limiting pt's independence with leisure/community pursuits.  Met with pt today to discuss leisure interests, activity analysis/modificaitons and coping strategies.  Pt is motivated to return home and participate in identified leisure interests.   Plan  No further TR as pt is expected to d/c 2/14.  Recommendations for other services: None   Discharge Criteria: Patient will be discharged from TR if patient refuses treatment 3 consecutive times without medical reason.  If treatment goals not met, if there is a change in medical status, if patient makes no progress towards goals or if patient is discharged from hospital.  The above assessment, treatment plan, treatment alternatives and goals were discussed and mutually agreed upon: by patient  [place] 05/13/2020, 12:06 PM

## 2020-05-13 NOTE — Progress Notes (Signed)
[facility] PHYSICAL MEDICINE & REHABILITATION PROGRESS NOTE   Subjective/Complaints:  Feels "blah" again today- thinks due to constant lack of sleep- never sleeps more than 2 hours at a time- doesn't want sleep meds- "takes too many meds already".  [place] this AM- 1 bite breakfast, but snacked all night.  Had Flagyl WITH breakfast- didn't get Zofran 30 minutes before Flagyl.    ROS:   Pt denies SOB, abd pain, CP, N/V/C/D, and vision changes   Objective:   No results found. Recent Labs    05/11/20 0450 05/11/20 1921 05/13/20 0508  WBC 6.8  --  7.0  HGB 6.3* 8.2* 8.4*  HCT 20.5* 25.6* 25.0*  PLT 123*  --  134*   Recent Labs    05/12/20 0512  NA 132*  K 4.5  CL 98  CO2 23  GLUCOSE 82  BUN 30*  CREATININE 2.46*  CALCIUM 8.4*    Intake/Output Summary (Last 24 hours) at 05/13/2020 0919 Last data filed at 05/12/2020 1854 Gross per 24 hour  Intake 680 ml  Output --  Net 680 ml     Pressure Injury 04/20/20 Buttocks Medial;Lower;Distal Stage 2 -  Partial thickness loss of dermis presenting as a shallow open injury with a red, pink wound bed without slough. weeping and scattered on the sacrum (Active)  04/20/20 2234  Location: Buttocks  Location Orientation: Medial;Lower;Distal  Staging: Stage 2 -  Partial thickness loss of dermis presenting as a shallow open injury with a red, pink wound bed without slough.  Wound Description (Comments): weeping and scattered on the sacrum  Present on Admission: Yes    Physical Exam: Vital Signs Blood pressure (!) 127/94, pulse 94, temperature 98.5 F (36.9 C), temperature source Oral, resp. rate 18, height 5\' 5"  (1.651 m), weight 47 kg, SpO2 100 %. Constitutional: awake, alert, laying on side in bed, NAD HENT: Normocephalic.  Atraumatic.less pale conjunctivae Eyes: EOMI. No discharge. Cardiovascular: RRR- no JVD Respiratory: CTA B/L- no W/R/R- good air movement GI: Soft, NT, ND, (+)BS  Skin: Warm and dry.   Scattered  healing hematomas valvur ulcer not assessed today; sacral stage II Psych: appears exhausted this AM Musc: No edema in extremities.  No tenderness in extremities. Neuro: Alert Motor: Bilateral upper extremities: 5/5 proximal distal Right lower extremity: Hip flexion, knee extension 2+/5, ankle dorsiflexion 5/5 Left lower extremity: Hip flexion, knee extension 4 -/5, ankle dorsiflexion 5/5 Station intact light touch    Assessment/Plan: 1. Functional deficits which require 3+ hours per day of interdisciplinary therapy in a comprehensive inpatient rehab setting.  Physiatrist is providing close team supervision and 24 hour management of active medical problems listed below.  Physiatrist and rehab team continue to assess barriers to discharge/monitor patient progress toward functional and medical goals  Care Tool:  Bathing    Body parts bathed by patient: Right arm,Left arm,Chest,Abdomen,Front perineal area,Buttocks,Right upper leg,Left upper leg,Face   Body parts bathed by helper: Right lower leg,Left lower leg,Buttocks     Bathing assist Assist Level: Moderate Assistance - Patient 50 - 74%     Upper Body Dressing/Undressing Upper body dressing   What is the patient wearing?: Pull over shirt    Upper body assist Assist Level: Set up assist    Lower Body Dressing/Undressing Lower body dressing    Lower body dressing activity did not occur: Refused What is the patient wearing?: Pants     Lower body assist Assist for lower body dressing: Moderate Assistance - Patient 50 - 74%  Toileting Toileting    Toileting assist Assist for toileting: Contact Guard/Touching assist Assistive Device Comment: Bedpan   Transfers Chair/bed transfer  Transfers assist     Chair/bed transfer assist level: Contact Guard/Touching assist     Locomotion Ambulation   Ambulation assist      Assist level: Minimal Assistance - Patient > 75% Assistive device: Walker-rolling Max  distance: 25   Walk 10 feet activity   Assist     Assist level: Minimal Assistance - Patient > 75% Assistive device: Walker-rolling   Walk 50 feet activity   Assist    Assist level: Minimal Assistance - Patient > 75% Assistive device: Walker-rolling    Walk 150 feet activity   Assist Walk 150 feet activity did not occur: Safety/medical concerns         Walk 10 feet on uneven surface  activity   Assist Walk 10 feet on uneven surfaces activity did not occur: Safety/medical concerns         Wheelchair     Assist Will patient use wheelchair at discharge?: Yes (pt may benefit from from [facility] for home pending progression, unable to assess on eval 2/2 pt's inability to tolerate sitting in WC 2/2 pain)   Wheelchair activity did not occur: Safety/medical concerns         Wheelchair 50 feet with 2 turns activity    Assist    Wheelchair 50 feet with 2 turns activity did not occur: Safety/medical concerns       Wheelchair 150 feet activity     Assist  Wheelchair 150 feet activity did not occur: Safety/medical concerns       Blood pressure (!) 127/94, pulse 94, temperature 98.5 F (36.9 C), temperature source Oral, resp. rate 18, height 5\' 5"  (1.651 m), weight 47 kg, SpO2 100 %.  Medical Problem List and Plan: 1.  Deficits with balance, standing, mobility, self-care secondary to debility.            Continue CIR 2. H/o DVT/ Antithrombotics: -DVT/anticoagulation:  Pharmaceutical: Other (comment)--low dose eliquis due to nose bleeds.              -antiplatelet therapy: N/A 3. Chronic pain Management:  Methadone --2.5 mg bid since radiation completed. Continue Tylenol tid                Monitor with increased exertion  2/3- pt also using voltaren gel for stiffness/pain- pain stable per pt- will wait to increase/change meds- con't to monitor  Controlled with meds on 2/5  2/9- per therapy, pain limiting therapy, but pt doesn't want more meds- con't  regimen LCSW to follow for evaluation and support.              -antipsychotic agents: N/A 5. Neuropsych: This patient is capable of making decisions on her own behalf. 6. Skin/Wound Care: Stage II sacral decub and R labial wounds.              - sacral decub treated with santyl and wet to dry dressing.   Ordered air mattress             - Right labia-->calcium alginate with dry dressing daily.  2/9- pt refusing packing of vulvar wound- con't dressings as able 7. Fluids/Electrolytes/Nutrition: Monitor I/Os. Continue supplements--reports that intake improving.   8. 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 Higher dose Daptomycin-->Tedezolid thorough 3/16.  Flagyl/Cipro thorough 2/26 to complete course of 6 total weeks  Weekly CK, CBC, BMP  2/7- getting severe nausea/vomiting from Flagyl- added Zofran scheduled in AM- seems to help to eat, THEN take Flagyl?  2/9- doing better with nausea/vomiting by taking flagyl after meals and scheduled zofran with meals 9. Thrombocytopenia: Platelets 120 (baseline)             Platelets 111 on 2/4, labs ordered for Monday  2/8- Plts stable at 123k 10. Candida esophagitis: On Diflucan thorough 2/12 to compl 11. Gastric ulcer: To continue protonix bid X 6 weeks.              --Continue to monitor H/H with serial checks.              --transfuse prn hgb< 7.0              See #12 12. Acute on chronic anemia:  Transfused 1/21 and 2/1 with improvement.              Hemoglobin 7.6 on 7/4, labs ordered for Monday  2/7- Hb 6.6- pt wants to recheck and THEN transfuse tomorrow if still low  2/8- Hb 6.3- will transfuse today 2 units  2/9- Hb 8.2- up from 1 unit pRBCs- cannot get another per blood director 13. CKD s/p renal transplant:             Creatinine 2.54 on 2/4, labs ordered for Monday  2/7- Cr 2.61- slowly creeping up- con't to  push PO fluids-   2/9- Cr slightly better- 2.46- con't to push fluids 14. Nausea/Vomiting  Scheduled Zofran 4 mg at 0630 every day to hopefully prevent nausea/vomting.  2/7- will make sure has phenergan prn.  2/8- Zofran TID scheduled.  2/9- better- con't regimen     2/10- will make sure Zofran, which is ordered to give 30 minutes prior to eating/Flagyl, will be given then-  15. Insomnia  2/10- doesn't want to change/add meds- will move 10 PM meds to 8pm-    LOS: 8 days A FACE TO FACE EVALUATION WAS PERFORMED  Christina Mack 05/13/2020, 9:19 AM

## 2020-05-13 NOTE — Progress Notes (Addendum)
Physical Therapy Weekly Progress Note  Patient Details  Name: Christina Mack MRN: [id number] Date of Birth: [birth year]  Beginning of progress report period: May 06, 2020 End of progress report period: May 13, 2020  Today's Date: 05/13/2020 PT Individual Time: 1000-1100 and 1445-1530 PT Individual Time Calculation (min): 60 min and 45 mins  Patient has met 4 of 4 short term goals.  Pt has shown limited progress as of yet 2/2 medical status and continued nausea/vomiting, intense fatigue. Pt continues to be to motivated to participate and improve however is limited with above reasons.  Patient continues to demonstrate the following deficits muscle weakness, decreased cardiorespiratoy endurance and decreased sitting balance, decreased standing balance and decreased balance strategies and therefore will continue to benefit from skilled PT intervention to increase functional independence with mobility.  Patient progressing toward long term goals..  Continue plan of care.  PT Short Term Goals Week 1:  PT Short Term Goal 1 (Week 1): pt to demonstrate supine<>sit CGA PT Short Term Goal 1 - Progress (Week 1): Not met PT Short Term Goal 2 (Week 1): pt to demonstrate functional transfers CGA PT Short Term Goal 2 - Progress (Week 1): Not met PT Short Term Goal 3 (Week 1): pt to demonstrate ambulation 71' CGA with LRAD PT Short Term Goal 3 - Progress (Week 1): Not met PT Short Term Goal 4 (Week 1): pt to tolerate OOB positioning for 45 mins Week 2:  PT Short Term Goal 1 (Week 2): pt to demonstrate functional transfers CGA PT Short Term Goal 2 (Week 2): pt to demonstrate supine<>sit CGA PT Short Term Goal 3 (Week 2): pt to ambulate with RW 25' CGA  Skilled Therapeutic Interventions/Progress Updates:    session 1: pt received in bed and agreeable to therapy however reported she did not sleep well at all, very nauseous post breakfast and only agreeable to bed level exercises. Pt directed in 2x15  3# bicep and tricep curls, chest press, front raise and 2x15 hip abduction/adduction, heel slides, glute squeezes and alternating knees to chest. Pt demonstrated improved mobility and control overall especially on LLE. Pt fatigued and required rest breaks intermittently to recover and had one instance of dry heaving and vomiting midway through session but requested to continue once done. Pt denied to attempt sitting EOB or transfer to standing. Pt left in bed, All needs in reach and in good condition. Call light in hand.    Session 2: pt received in bed and agreeable to therapy, post OT session. Pt directed in supine>sit CGA, sat EOB SBA, and reported she needed to urinate, Stand pivot transfer to [facility] CGA with Rolling walker and (+)bladder void, SBA for pericare. directed in gait training with Rolling walker for 20' CGA with VC for trunk extension and increased stride length. Pt reported fatigue, and requested to return to bedside. Pt unable to tolerate sitting upright 2/2 pain in perineum area and required to return to supine, min A for BLE management. Pt directed in bed exercises with 2.5# and 1.5# weights for punches 2x15 each however pt reported pain and needed to reposition and unable to use weights or participate in BUE exercises. Pt directed in BLE exercises of heel slides, bridges, glute squeezes, and hip abduction/adduction 2x15 each. Pt left in bed, All needs in reach and in good condition. Call light in hand.    Therapy Documentation Precautions:  Precautions Precautions: Fall Precaution Comments: recent falls, wounds Restrictions Weight Bearing Restrictions: No General:   Vital  Signs:   Pain:   Vision/Perception     Mobility:   Locomotion :    Trunk/Postural Assessment :    Balance:   Exercises:   Other Treatments:     Therapy/Group: Individual Therapy  Junie Panning 05/13/2020, 12:10 PM

## 2020-05-13 NOTE — Progress Notes (Signed)
Occupational Therapy Session Note  Patient Details  Name: Christina Mack MRN: [id number] Date of Birth: 1965/11/28  Today's Date: 05/13/2020 OT Individual Time: 1401-1443 OT Individual Time Calculation (min): 42 min    Short Term Goals: Week 1:  OT Short Term Goal 1 (Week 1): patient will complete bed mobility with CS using assistive devices as needed OT Short Term Goal 1 - Progress (Week 1): Progressing toward goal OT Short Term Goal 2 (Week 1): patient will complete SPT with RW CS level OT Short Term Goal 2 - Progress (Week 1): Met OT Short Term Goal 3 (Week 1): patient will complete lower body adl min A with assistive devices OT Short Term Goal 3 - Progress (Week 1): Progressing toward goal Week 2:  OT Short Term Goal 1 (Week 2): STGs = LTGs d/t ELOS  Skilled Therapeutic Interventions/Progress Updates:    Pt greeted at time of session supine in bed still feeling "blah" and not feeling up to any seated EOB or OOB activities, politely declined ADL as well. Agreeable to bed level therex with 2.5# dowel for chest press, overhead raise, FWD/backward circles and bicep curls all for 2x15 with rest breaks in between. Overhead stretches as well for full body stretch with raising arms overhead 1x5 with static hold. Discussed throughout session regarding adaptive ways to complete IADLs for her furniture painting and remodeling to perform standing vs seated and positions for comfort. Also discussion for padded BSC, pt aware of where to purchase if wanted. Pt in supine resting with call bell in reach all needs met.   Therapy Documentation Precautions:  Precautions Precautions: Fall Precaution Comments: recent falls, wounds Restrictions Weight Bearing Restrictions: No    Therapy/Group: Individual Therapy  Viona Gilmore 05/13/2020, 2:44 PM

## 2020-05-13 NOTE — Progress Notes (Signed)
Nutrition Follow-up  DOCUMENTATION CODES:   Severe malnutrition in context of chronic illness  INTERVENTION:   - [employer] BID between meals, each supplement provides 455 kcal, 20 grams protein   -[employer] Breakfast with Lactaid milk TID with meals, each provides 220 kcal, 13 grams of protein   -Double protein portions TID with meals   -Encourage adequate PO intake   -d/c Boost Breeze as pt does not like the sweetness    NUTRITION DIAGNOSIS:   Severe Malnutrition related to cancer and cancer related treatments,chronic illness as evidenced by severe fat depletion,severe muscle depletion,energy intake < 75% for > or equal to 1 month.   GOAL:   Patient will meet greater than or equal to 90% of their needs  Ongoing.   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 Pt was admitted on 04/20/20 with BUE jerking episodes, weakness, N/V. GI consulted due to ongoing nausea and vomiting and endoscopy done on 04/24/20 revealed non-bleeding gastric ulcer with esophageal plaques consistent with candidiasis. Pt was started on IV Diflucan x 2 weeks with recommendations for PPI x 6 weeks. Follow-up MRI pelvis done 04/27/20 showed large area of ulceration with diffuse phlegmon, extensive myositis of bilateral adductor and anterior muscular compartment of both thighs, multilocular abscess in inferior left pelvis surrounding pubic rami and extending to upper left thigh as well as nondisplaced inferior right pubic fracture with suggestion of osteomyelitis involving pubic rami. Pt admitted to [facility] on 05/05/20.  Pt's note indicate pt has been consuming an inconsistent amount of meals 5-100% since 02/07 (averaging  25-30%). Pt noted that she has been eating approximately 50% of her meals daily. She indicated that she is not able to eat all of her meals d/t feeling full quickly. Pt acknowledged the double protein portions she has been given. Pt discussed with intern that she is doing the best she can with eating as much as possible to ensure she is meeting her nutritional goals. Pt understood the importance of protein and calories to aid in her healing process and to sustain strength.   Pt discussed with intern that she does not like the Boost Breeze supplements as they are taste too sweet. Intern discussed with pt the option of switching to Ensure [place], however pt mentioned that she has previously had this supplement and did not like it. Pt mentioned that she was enjoying the [employer] with Lactaid milk and would continue to drink these for added calories and protein. Intern also added [employer] supplement for pt to try as it is not as sweet as Libr[employer], academic or [employer].   Pt denied any diarrhea, vomiting or abdominal pain. Pt did mention that she had nausea this morning which ultimately prevented her from consuming her breakfast. However, pt said her nausea has been under control since she started taking Flagyl after her meals rather than before.   Pt's weights were reviewed and have been relatively consistent since admission with slight variation likely d/t fluid. Per previous RD note, pt had experienced a severe and significant weight loss between 01/18 (56.1 kg) and 02/03 (46.1 kg). Pt currently weighs 47 kg (02/10), therefore no significant changes to note.    I&Os Reviewed.   Meds Reviewed: Boost Breeze (  BID), Mag-Ox (400 mg, BID), Miralax (17 g, daily), Deltasone (5 mg, daily), Senekot (17.2 mg, daily), sodium bicarbonate (1300 mg, BID).  Labs Reviewed: Sodium (132 mg/dL), BUN (30 mg/dL), Creatinine (2.46 mg/dL), calcium (8.4 mg/dL), GFR (23 mL/min)  NUTRITION - FOCUSED PHYSICAL  EXAM:  Flowsheet Row Most Recent Value  Orbital Region Severe depletion  Upper Arm Region Severe depletion  Thoracic and Lumbar Region Moderate depletion  Buccal Region Severe depletion  Temple Region Severe depletion  Clavicle Bone Region Severe depletion  Clavicle and Acromion Bone Region Severe depletion  Scapular Bone Region Severe depletion  Dorsal Hand Severe depletion  Patellar Region Moderate depletion  Anterior Thigh Region Moderate depletion  Posterior Calf Region Mild depletion  Edema (RD Assessment) None  Hair Reviewed  Eyes Reviewed  Mouth Reviewed  Skin Reviewed  Nails Reviewed       Diet Order:   Diet Order            Diet regular Room service appropriate? Yes; Fluid consistency: Thin  Diet effective now                 EDUCATION NEEDS:   No education needs have been identified at this time  Skin:  Skin Assessment: Skin Integrity Issues: Skin Integrity Issues:: Incisions,Stage II Stage II: buttocks Other: wound to R labia and groin area secondary to radiation burns  Last BM:  05/11/20 (type 7)  Height:   Ht Readings from Last 1 Encounters:  05/05/20 5\' 5"  (1.651 m)    Weight:   Wt Readings from Last 1 Encounters:  05/13/20 47 kg    Ideal Body Weight:  59 kg  BMI:  Body mass index is 17.24 kg/m.  Estimated Nutritional Needs:   Kcal:  1800-2000  Protein:  85-105 grams  Fluid:  1.5 L/day  Salvadore Oxford, Dietetic Intern 05/13/2020 2:09 PM

## 2020-05-13 NOTE — Progress Notes (Signed)
Patient ID: Christina Mack, female   DOB: 05/03/1965, 55 y.o.   MRN: [id number]  Met with pt to discuss if she would get padded bedside commode on own or wanted worker to get her a regular bedside commode. She has a regular one at home and will decide later regarding a padded one. She has the information regarding padded and web sites to get it from. She has all needed equipment and follow up set up via [employer]. Pt doing well today according to her.

## 2020-05-13 NOTE — Plan of Care (Signed)
°  Problem: RH SKIN INTEGRITY Goal: RH STG SKIN FREE OF INFECTION/BREAKDOWN Description: Remain free of additional breakdown and infection with min assist. Outcome: Not Progressing; continue with current treatment

## 2020-05-13 NOTE — Progress Notes (Signed)
Occupational Therapy Weekly Progress Note  Patient Details  Name: Christina Mack MRN: [id number] Date of Birth: [birth year]  Beginning of progress report period: May 06, 2020 End of progress report period: May 13, 2020  Today's Date: 05/13/2020 OT Individual Time: 2620-3559 OT Individual Time Calculation (min): 13 min  and Today's Date: 05/13/2020 OT Missed Time: 47 Minutes Missed Time Reason: Patient fatigue   Patient has met 1 of 3 short term goals.  Pt is currently CGA/close supervision for functional mobility short distances with RW, performing ADL transfers to [facility] and wheelchair in same manner. Occasionally needing Min A for bed mobility and some sit to stands when fatigued/not feeling well. Pt has been limited d/t nausea, pain, and fatigue recently which has prevented consistent progress toward OT goals. Pt is Min-Mod A with LB bathing/dressing d/t pain in groin area with positioning/reaching forward. Pt is slowly progressing toward OT goals with plan for DC home next week with family support.   Patient continues to demonstrate the following deficits: muscle weakness, decreased cardiorespiratoy endurance, decreased coordination and decreased motor planning and decreased sitting balance, decreased standing balance, decreased postural control and decreased balance strategies and therefore will continue to benefit from skilled OT intervention to enhance overall performance with BADL and Reduce care partner burden.  Patient progressing toward long term goals..  Continue plan of care.  OT Short Term Goals Week 1:  OT Short Term Goal 1 (Week 1): patient will complete bed mobility with CS using assistive devices as needed OT Short Term Goal 1 - Progress (Week 1): Progressing toward goal OT Short Term Goal 2 (Week 1): patient will complete SPT with RW CS level OT Short Term Goal 2 - Progress (Week 1): Met OT Short Term Goal 3 (Week 1): patient will complete lower body adl min A with  assistive devices OT Short Term Goal 3 - Progress (Week 1): Progressing toward goal Week 2:  OT Short Term Goal 1 (Week 2): STGs = LTGs d/t ELOS  Skilled Therapeutic Interventions/Progress Updates:    Pt greeted at time of session supine in bed resting, stating she feels bad this morning and did not feel like participating in OT session. With encouragement, originally agreeable to sit EOB for grooming and oral hygiene but when attempting, pt then declined sitting EOB d/t fatigue and feeling "blah" today. Remained in bed with HOB elevated performing grooming and oral hygiene with set up. Declined changing clothes, toileting, bed level activity, etc. All needs met, call bell in reach. Missed 47 mins of OT d/t fatigue and feeling ill.   Therapy Documentation Precautions:  Precautions Precautions: Fall Precaution Comments: recent falls, wounds Restrictions Weight Bearing Restrictions: No   Therapy/Group: Individual Therapy  Viona Gilmore 05/13/2020, 7:10 AM

## 2020-05-14 DIAGNOSIS — E43 Unspecified severe protein-calorie malnutrition: Secondary | ICD-10-CM | POA: Insufficient documentation

## 2020-05-14 LAB — BASIC METABOLIC PANEL
Anion gap: 9 (ref 5–15)
BUN: 31 mg/dL — ABNORMAL HIGH (ref 6–20)
CO2: 22 mmol/L (ref 22–32)
Calcium: 8.6 mg/dL — ABNORMAL LOW (ref 8.9–10.3)
Chloride: 101 mmol/L (ref 98–111)
Creatinine, Ser: 2.41 mg/dL — ABNORMAL HIGH (ref 0.44–1.00)
GFR, Estimated: 23 mL/min — ABNORMAL LOW (ref >60)
Glucose, Bld: 81 mg/dL (ref 70–99)
Potassium: 4.5 mmol/L (ref 3.5–5.1)
Sodium: 132 mmol/L — ABNORMAL LOW (ref 135–145)

## 2020-05-14 NOTE — Progress Notes (Signed)
Physical Therapy Session Note  Patient Details  Name: Christina Mack MRN: [id number] Date of Birth: Jul 07, 1965  Today's Date: 05/14/2020 PT Individual Time: 1132-1201 and 1300-1400 PT Individual Time Calculation (min): 29 min and 60 mins   Short Term Goals: Week 1:  PT Short Term Goal 1 (Week 1): pt to demonstrate supine<>sit CGA PT Short Term Goal 1 - Progress (Week 1): Not met PT Short Term Goal 2 (Week 1): pt to demonstrate functional transfers CGA PT Short Term Goal 2 - Progress (Week 1): Not met PT Short Term Goal 3 (Week 1): pt to demonstrate ambulation 21' CGA with LRAD PT Short Term Goal 3 - Progress (Week 1): Not met PT Short Term Goal 4 (Week 1): pt to tolerate OOB positioning for 45 mins PT Short Term Goal 4 - Progress (Week 1): Not met Week 2:  PT Short Term Goal 1 (Week 2): pt to demonstrate functional transfers CGA PT Short Term Goal 2 (Week 2): pt to demonstrate supine<>sit CGA PT Short Term Goal 3 (Week 2): pt to ambulate with RW 25' CGA  Skilled Therapeutic Interventions/Progress Updates:    session 1: pt received in [facility] and agreeable to therapy handed off from PT post that session. Pt reported she was feeling well today and agreeable to attempt stair training. Pt taken to gym in TIS total A for time and energy. Pt directed in 6" stair training with B handrails min A 3 steps step to pattern. Per pt, she has 3 steps to enter home from back of house and husband to assist. Pt then returned to room and directed in gait training to bedside 20' with Rolling walker CGA. Pt then directed in sit>supine min A for BLE management and requested to remain in bed at end of session. Pt left in bed, All needs in reach and in good condition. Call light in hand.    Session 2: pt received in bed and agreeable to therapy. Pt denied pain and directed in supine>sit min A and Stand pivot transfer to WC min A with Rolling walker. Pt agreeable to complete therapy session outside. Nursing aware and  agreeable. Pt taken off unit in WC, total A for time and energy. Pt directed in WC mobility on level surface for 15' mod A and educated on positioning in [facility] for tilting and BLE placement. Pt placed in tilted position in [facility] for skin integrity and pt comfort, sat outside for mood improvement, increased participation and overall mental therapeutic nature for improved session. Pt reporting feeling much more comfortable, decreased pain, and feeling better once outside and off of unit. PT provided emotional support throughout session. Pt directed in BLE exercises of ankle pumps, heel slides, and glute squeezes 2x15. Pt returned to unit, and room total A and requested to return to bed, min A for transfer to standing and supine in bed. Pt left in bed, All needs in reach and in good condition. Call light in hand.  And nursing present.   Therapy Documentation Precautions:  Precautions Precautions: Fall Precaution Comments: recent falls, wounds Restrictions Weight Bearing Restrictions: No General:   Vital Signs:  Pain:   Mobility:   Locomotion :    Trunk/Postural Assessment :    Balance:   Exercises:   Other Treatments:      Therapy/Group: Individual Therapy  Christina Mack 05/14/2020, 2:47 PM

## 2020-05-14 NOTE — Progress Notes (Signed)
Occupational Therapy Session Note  Patient Details  Name: Christina Mack MRN: [id number] Date of Birth: 1966/03/15  Today's Date: 05/14/2020 OT Individual Time: 1749-4496 OT Individual Time Calculation (min): 58 min   Short Term Goals: Week 2:  OT Short Term Goal 1 (Week 2): STGs = LTGs d/t ELOS  Skilled Therapeutic Interventions/Progress Updates:    Pt greeted in bed with no c/o pain initially, requesting to use the [facility]. Pt able to transition to EOB with increased time and close supervision for balance safety when using the bedrail. CGA for stand pivot<BSC using the RW. CGA for toileting tasks with pt having B+B void. She transferred to the w/c after, CGA via stand pivot. She completed oral care and face washing while sitting at the sink with TIS reclined. She also donned 2 pillowcases and lotioned lower legs with Mod A. Pt able to utilize reclined figure 4 position bilaterally to meet demands of task though did report onset of Rt knee pain extending to groin area with increased time in this position. Pt required Max A to don the Rt sock due to pain and Mod A to don the Lt sock. Pt reports having OCD and therefore is particular about how the sock is donned/oriented. Taught her how to use the reacher to doff socks beforehand which she did with supervision today. CGA for stand pivot<bed in the same method as stated above where she returned to bed. OT demonstrated use of sock aide for energy conservation and pain mgt when attempting to don footwear herself. She would benefit from hands on practice during a later session. Pt remained in sidelying position at close of session for pain mgt due to periarea wounds, all needs within reach. Tx focus placed on functional transfers, OOB tolerance, and adaptive self care skills.   Therapy Documentation Precautions:  Precautions Precautions: Fall Precaution Comments: recent falls, wounds Restrictions Weight Bearing Restrictions: No ADL: ADL Eating: Set  up Where Assessed-Eating: Bed level Grooming: Setup Where Assessed-Grooming: Bed level Upper Body Bathing: Setup Where Assessed-Upper Body Bathing: Edge of bed Lower Body Bathing: Maximal assistance Where Assessed-Lower Body Bathing: Edge of bed Upper Body Dressing: Minimal assistance Where Assessed-Upper Body Dressing: Edge of bed Lower Body Dressing: Maximal assistance Where Assessed-Lower Body Dressing: Edge of bed Toileting: Moderate assistance Where Assessed-Toileting: Bed level Toilet Transfer: Minimal assistance Toilet Transfer Method: Stand pivot Toilet Transfer Equipment: Bedside commode ADL Comments: SPT to/from w/c, commode with min A but unable to tolerate sitting at this time due to pain - she opts to use bed pan due to ongoing pain     Therapy/Group: Individual Therapy  Sahar Ryback A Charbel Los 05/14/2020, 3:04 PM

## 2020-05-14 NOTE — Consult Note (Signed)
Neuropsychological Consultation   Patient:   Christina Mack   DOB:   06-Jan-1966  MR Number:  [id number]  Location:  [facility] [address] [facility] [address] [place] [id number] [place] [address] Dept: [place]: [phone]           Date of Service:   05/14/2020  Start Time:   8 AM End Time:   9 AM  Provider/Observer:  Ilean Skill, Psy.D.       Clinical Neuropsychologist       Billing Code/Service: [id number]  Chief Complaint:    Christina Mack is a 55 year old female who has a very complicated past medical history including stage III recurrent vulvar cancer with chemo, end-stage renal disease post renal transplant, DVT with complete past medical history available below in the patient's full HPI.  Patient admitted to the comprehensive rehab program due to debility and severe weakness and has been participating in OT/PT with anxiety associated with walking with multiple prior falls.  Reason for Service:  Patient was referred for neuropsychological consultation due to coping and adjustment issues in the setting of complex medical status.  Below is the HPI for the current admission.  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  History taken from chart review and patient.  CT head done due to fall and showed tiny chronic left thalamic infarct and negative for acute changes.  Patient declined EEG and jerking felt to be due to metabolic derangements.  Nephrology consulted for input on progressive renal failure with failure to thrive and 50 lbs unintentional weight loss. N/V felt to be multifactorial, hemodialysis discussed (patient did not  want to start HD) and medication changes recommended with involvement of palliative care to help determine GOC.  Failing kidney felt to be due to sarcopenia and poor nutrition--as renal function stable, nephrology has signed off with recommendations of IVF prn for hydration.    Dr. Pincus Sanes consulted due to ongoing nausea and vomiting (started last fall) and endoscopy done on 04/24/2020 revealing non bleeding gastric ulcer with esophageal plaques consistent with candidiasis. She was started on IV Diflucan X 2 weeks with recommendations for PPI X 6 weeks and cleared to resume [facility].  She completed her antibiotic regimen on 01/22 and had recurrent shaking episode on 04/26/2020, questioned to be syncopal.  ID recommended monitoring patient off antibiotics and question shaking episodes as rigors.  MRI pelvis done 04/27/2020 for follow up and showed large area of ulceration with diffuse phlegmon, extensive myositis of bilateral adductor and anterior muscular compartment of both thighs, multilocular abscess in inferior left pelvis surrounding pubic rami and extending to upper left thigh as well as nondisplaced inferior right pubic fracture with suggestion of osteomyelitis involving pubic rami.   Dr. Delsa Sale consulted and recommended debridement and wound care. Plastics consulted for input and did not feel surgical debridement needed.  She underwent CT guided biopsy of left abdominal wall on 04/22/2020. Hip aspirate grew out enterococcus faecium and daptomycin recommended -->patient agreeable to IV therapy in hospital--daptomycin higher dose and to transition to [facility] on outpatient basis for 6 week antibiotic therapy.  Levaquin and metronidazole added back due to presumptive polymicrobial infection.  She had  drop in Hgb to 6.9 on 05/04/2020 and was transfused with one unit PRBC. She did develop nosebleed last evening and eliquis decreased to 2.5 mg daily. She has had issues with significant left hip pain with  weakness as well as anxiety/fear of falls affecting mobility and ADLs. Therapy ongoing and currently working on balance and standing--has been able to do some walking. CIR recommended due to functional decline.  Please see preadmission assessment from earlier today as well.  Current Status:  Patient was asleep lying in her bed upon entering the room.  She easily awoke and was alert and oriented.  Had very appropriate questions and described issues associated with her anxiety after multiple falls and severe debility.  Patient talked about extended period of time in outside hospital and going home only to be readmitted to the hospital 3 days later.  Patient describes her feelings of significant improvement and has been extending the length walking with rolling walker.  Patient continues to be anxious around walking but that is improving with no experiences.  Patient denied distress leading to the point that she is unable to fully participate in therapeutic interventions.  Serious long-term medical issues are still having to be addressed with and cope with by the patient.  Behavioral Observation: Christina Mack  presents as a 55 y.o.-year-old Right Caucasian Female who appeared her stated age. her dress was Appropriate and she was Well Groomed and her manners were Appropriate to the situation.  her participation was indicative of Appropriate and Attentive behaviors.  There were physical disabilities noted.  she displayed an appropriate level of cooperation and motivation.     Interactions:    Active Appropriate and Attentive  Attention:   within normal limits and attention span and concentration were age appropriate  Memory:   within normal limits; recent and remote memory intact  Visuo-spatial:  not examined  Speech (Volume):  low  Speech:   normal; normal  Thought Process:  Coherent and Relevant  Though Content:  WNL; not suicidal and not homicidal  Orientation:   person, place, time/date and  situation  Judgment:   Good  Planning:   Fair  Affect:    Anxious  Mood:    Anxious  Insight:   Good  Intelligence:   normal  Medical History:   Past Medical History:  Diagnosis Date  . Complication of anesthesia    nausea and vomiting         Patient Active Problem List   Diagnosis Date Noted  . Chronic kidney disease (CKD), stage IV (severe) ([facility])   . Chronic pain syndrome   . Cancer associated pain   . Pressure injury of skin 05/06/2020  . Debility   . Acute on chronic anemia   . Abscess   . Thrombocytopenia ([place]) 04/26/2020  . Esophageal candidiasis ([place]) 04/26/2020  . Normocytic anemia 04/26/2020  . CKD (chronic kidney disease) stage 4, GFR 15-29 ml/min ([facility]) 04/26/2020  . Wound of right groin 04/26/2020  . Gastric ulcer without hemorrhage or perforation 04/26/2020  . Malnutrition of moderate degree 04/26/2020  . Acute osteomyelitis of right pelvic region and thigh ([facility])   . Pressure ulcer of coccygeal region, stage 2 ([facility]) 04/21/2020  . Weakness 04/20/2020  . 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([facility]) 11/06/2019  . Kidney transplant status 11/22/2012   Abuse/Trauma History: Severe medical issues  Psychiatric History:  Anxiety associated with critical medical illness  Family Med/Psych History:  Family History  Problem Relation Age of Onset  . Cerebral  aneurysm Mother   . AAA (abdominal aortic aneurysm) Brother   . Heart attack Maternal Grandmother   . Brain cancer Maternal Grandfather     Impression/DX:  Christina Mack is a 55 year old female who has a very complicated past medical history including stage III recurrent vulvar cancer with chemo, end-stage renal disease post renal transplant, DVT with complete past medical history available below in the patient's full HPI.  Patient admitted to the comprehensive rehab program due to debility and severe weakness and has been participating in OT/PT with anxiety associated with walking with multiple prior falls.  Patient was asleep  lying in her bed upon entering the room.  She easily awoke and was alert and oriented.  Had very appropriate questions and described issues associated with her anxiety after multiple falls and severe debility.  Patient talked about extended period of time in outside hospital and going home only to be readmitted to the hospital 3 days later.  Patient describes her feelings of significant improvement and has been extending the length walking with rolling walker.  Patient continues to be anxious around walking but that is improving with no experiences.  Patient denied distress leading to the point that she is unable to fully participate in therapeutic interventions.  Serious long-term medical issues are still having to be addressed with and cope with by the patient.  Disposition/Plan:  Worked on coping and adjustemnt issues.  Patient's planned discharge date is Monday 14th, so will not have time to see again.  Diagnosis:    Debility - Plan: Ambulatory referral to Physical Medicine Rehab         Electronically Signed   _______________________ Ilean Skill, Psy.D. Clinical Neuropsychologist

## 2020-05-14 NOTE — Progress Notes (Signed)
Pharmacy Antibiotic Note  Christina Mack is a 55 y.o. 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 Hospital course complicated by bactermeia due to bacteroides/eggerthella species. Pharmacy has been consulted for daptomycin dosing at higher dose of 10mg /kg. Patient has been approved for CIR and will begin IV antibiotic therapy while inpatient. Plan per ID is to transition to oral levaquin and flagyl at discharge and finish 6 weeks total of antibiotics on February 26th.   Patient continues to be in CIR - has been switched to oral Levaquin and Flagyl. Daptomycin dose still appropriate, CK from 2/10 22. Patient remains afebrile and SCr trending down slightly 2.41. Plan per ID note on 2/2 is to continue daptomycin while in CIR then finish out course with tedizolid through March 16th with [facility] follow up.   Plan: Continue daptomycin 500 mg IV q48h Monitor weekly CK (next on 2/17) Monitor renal function and adverse effects  Height: 5\' 5"  (165.1 cm) Weight: 49.4 kg (109 lb) IBW/kg (Calculated) : 57  Temp (24hrs), Avg:98.4 F (36.9 C), Min:98.4 F (36.9 C), Max:98.4 F (36.9 C)  Recent Labs  Lab 05/10/20 0559 05/11/20 0450 05/12/20 0512 05/13/20 0508 05/14/20 0455  WBC 7.1 6.8  --  7.0  --   CREATININE 2.61*  --  2.46*  --  2.41*    Estimated Creatinine Clearance: 21.1 mL/min (A) (by C-G formula based on SCr of 2.41 mg/dL (H)).    Allergies  Allergen Reactions  . Zosyn [Piperacillin Sod-Tazobactam So] Other (See Comments)    Thrombocytopenia  . Codeine     vomiting  . Hydromorphone Nausea And Vomiting  . Oxycodone-Acetaminophen Itching, Nausea Only, Other (See Comments) and Nausea And Vomiting    Other Reaction: HA    Antimicrobials this admission: Diflucan 1/22> 2/11 Dapto 2/2>> Levaquin 2/2>>(2/27) Flagyl 2/2>> (2/26)  Dose adjustments this admission:   Microbiology results: 1/1 blood>>Bacteroides ovatus/xylanisolvens  and Eggerthella lenta can't see sens. 1/25 blood>>negF 1/28 abd>> enterococcus faecium (VRE)    Thank you for allowing [place] to participate in this patients care.   Jens Som, PharmD Please see amion for complete clinical pharmacist phone list. 05/14/2020 3:25 PM

## 2020-05-14 NOTE — Progress Notes (Signed)
[facility] PHYSICAL MEDICINE & REHABILITATION PROGRESS NOTE   Subjective/Complaints:  Pt reports nausea is better since switched timing of Zofran, meals and Flagyl- doing better.   R ear also better than it had been.  Back hurts, but feels that's due to the bed, being in it so long- for weeks.     ROS:   Pt denies SOB, abd pain, CP, N/V/C/D, and vision changes  Objective:   No results found. Recent Labs    05/11/20 1921 05/13/20 0508  WBC  --  7.0  HGB 8.2* 8.4*  HCT 25.6* 25.0*  PLT  --  134*   Recent Labs    05/12/20 0512 05/14/20 0455  NA 132* 132*  K 4.5 4.5  CL 98 101  CO2 23 22  GLUCOSE 82 81  BUN 30* 31*  CREATININE 2.46* 2.41*  CALCIUM 8.4* 8.6*    Intake/Output Summary (Last 24 hours) at 05/14/2020 1104 Last data filed at 05/13/2020 1908 Gross per 24 hour  Intake 240 ml  Output -  Net 240 ml     Pressure Injury 04/20/20 Buttocks Medial;Lower;Distal Stage 2 -  Partial thickness loss of dermis presenting as a shallow open injury with a red, pink wound bed without slough. weeping and scattered on the sacrum (Active)  04/20/20 2234  Location: Buttocks  Location Orientation: Medial;Lower;Distal  Staging: Stage 2 -  Partial thickness loss of dermis presenting as a shallow open injury with a red, pink wound bed without slough.  Wound Description (Comments): weeping and scattered on the sacrum  Present on Admission: Yes     Pressure Injury 05/05/20 Sacrum Unstageable - Full thickness tissue loss in which the base of the injury is covered by slough (yellow, tan, gray, green or brown) and/or eschar (tan, brown or black) in the wound bed. yellow slough (Active)  05/05/20   Location: Sacrum  Location Orientation:   Staging: Unstageable - Full thickness tissue loss in which the base of the injury is covered by slough (yellow, tan, gray, green or brown) and/or eschar (tan, brown or black) in the wound bed.  Wound Description (Comments): yellow slough   Present on Admission: Yes    Physical Exam: Vital Signs Blood pressure (!) 138/99, pulse 93, temperature 98.4 F (36.9 C), resp. rate 20, height 5\' 5"  (1.651 m), weight 49.4 kg, SpO2 99 %. Constitutional: awake, alert, but sleepy, laying on side and rolling around in bed to get comfortable, NAD- frail, BMI 18 HENT: Normocephalic.  Atraumatic.less pale conjunctivae- no change Eyes: EOMI. No discharge. Cardiovascular: improved HR- RRR Respiratory: CTA B/L- no W/R/R- good air movement GI: Soft, NT, ND, (+)BS  Skin: Warm and dry.   Scattered healing hematomas valvur ulcer not assessed today; sacral stage II Psych: appears exhausted this AM Musc: No edema in extremities.  No tenderness in extremities. Neuro: Alert Motor: Bilateral upper extremities: 5/5 proximal distal Right lower extremity: Hip flexion, knee extension 2+/5, ankle dorsiflexion 5/5 Left lower extremity: Hip flexion, knee extension 4 -/5, ankle dorsiflexion 5/5 Station intact light touch    Assessment/Plan: 1. Functional deficits which require 3+ hours per day of interdisciplinary therapy in a comprehensive inpatient rehab setting.  Physiatrist is providing close team supervision and 24 hour management of active medical problems listed below.  Physiatrist and rehab team continue to assess barriers to discharge/monitor patient progress toward functional and medical goals  Care Tool:  Bathing    Body parts bathed by patient: Right arm,Left arm,Chest,Abdomen,Front perineal area,Buttocks,Right upper leg,Left upper  leg,Face   Body parts bathed by helper: Right lower leg,Left lower leg,Buttocks     Bathing assist Assist Level: Moderate Assistance - Patient 50 - 74%     Upper Body Dressing/Undressing Upper body dressing   What is the patient wearing?: Pull over shirt    Upper body assist Assist Level: Set up assist    Lower Body Dressing/Undressing Lower body dressing    Lower body dressing activity did not  occur: Refused What is the patient wearing?: Pants     Lower body assist Assist for lower body dressing: Moderate Assistance - Patient 50 - 74%     Toileting Toileting    Toileting assist Assist for toileting: Contact Guard/Touching assist Assistive Device Comment: Bedpan   Transfers Chair/bed transfer  Transfers assist     Chair/bed transfer assist level: Contact Guard/Touching assist     Locomotion Ambulation   Ambulation assist      Assist level: Minimal Assistance - Patient > 75% Assistive device: Walker-rolling Max distance: 25   Walk 10 feet activity   Assist     Assist level: Minimal Assistance - Patient > 75% Assistive device: Walker-rolling   Walk 50 feet activity   Assist    Assist level: Minimal Assistance - Patient > 75% Assistive device: Walker-rolling    Walk 150 feet activity   Assist Walk 150 feet activity did not occur: Safety/medical concerns         Walk 10 feet on uneven surface  activity   Assist Walk 10 feet on uneven surfaces activity did not occur: Safety/medical concerns         Wheelchair     Assist Will patient use wheelchair at discharge?: Yes (pt may benefit from from [facility] for home pending progression, unable to assess on eval 2/2 pt's inability to tolerate sitting in WC 2/2 pain)   Wheelchair activity did not occur: Safety/medical concerns         Wheelchair 50 feet with 2 turns activity    Assist    Wheelchair 50 feet with 2 turns activity did not occur: Safety/medical concerns       Wheelchair 150 feet activity     Assist  Wheelchair 150 feet activity did not occur: Safety/medical concerns       Blood pressure (!) 138/99, pulse 93, temperature 98.4 F (36.9 C), resp. rate 20, height 5\' 5"  (1.651 m), weight 49.4 kg, SpO2 99 %.  Medical Problem List and Plan: 1.  Deficits with balance, standing, mobility, self-care secondary to debility.            Continue CIR 2. H/o DVT/  Antithrombotics: -DVT/anticoagulation:  Pharmaceutical: Other (comment)--low dose eliquis due to nose bleeds.              -antiplatelet therapy: N/A 3. Chronic pain Management:  Methadone --2.5 mg bid since radiation completed. Continue Tylenol tid                Monitor with increased exertion  2/3- pt also using voltaren gel for stiffness/pain- pain stable per pt- will wait to increase/change meds- con't to monitor  Controlled with meds on 2/5  2/9- per therapy, pain limiting therapy, but pt doesn't want more meds- con't regimen  2/11- specifically feels pain is "fine"- doesn't want to change meds in spite of back pain- takes "enough" meds LCSW to follow for evaluation and support.              -antipsychotic agents: N/A 5. Neuropsych: This  patient is capable of making decisions on her own behalf. 6. Skin/Wound Care: Stage II sacral decub and R labial wounds.              - sacral decub treated with santyl and wet to dry dressing.   Ordered air mattress             - Right labia-->calcium alginate with dry dressing daily.  2/9- pt refusing packing of vulvar wound- con't dressings as able  2/11- on air mattress- back hurting, but thinks it would be from any mattress, since in bed so long.  7. Fluids/Electrolytes/Nutrition: Monitor I/Os. Continue supplements--reports that intake improving.   8. 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 Higher dose Daptomycin-->Tedezolid thorough 3/16.              Flagyl/Cipro thorough 2/26 to complete course of 6 total weeks  Weekly CK, CBC, BMP  2/7- getting severe nausea/vomiting from Flagyl- added Zofran scheduled in AM- seems to help to eat, THEN take Flagyl?  2/9- doing better with nausea/vomiting by taking flagyl after meals and scheduled zofran with meals 9. Thrombocytopenia: Platelets 120 (baseline)             Platelets 111 on 2/4,  labs ordered for Monday  2/8- Plts stable at 123k 10. Candida esophagitis: On Diflucan thorough 2/12 to compl 11. Gastric ulcer: To continue protonix bid X 6 weeks.              --Continue to monitor H/H with serial checks.              --transfuse prn hgb< 7.0              See #12 12. Acute on chronic anemia:  Transfused 1/21 and 2/1 with improvement.              Hemoglobin 7.6 on 7/4, labs ordered for Monday  2/7- Hb 6.6- pt wants to recheck and THEN transfuse tomorrow if still low  2/8- Hb 6.3- will transfuse today 2 units  2/9- Hb 8.2- up from 1 unit pRBCs- cannot get another per blood director 13. CKD s/p renal transplant:             Creatinine 2.54 on 2/4, labs ordered for Monday  2/7- Cr 2.61- slowly creeping up- con't to push PO fluids-   2/9- Cr slightly better- 2.46- con't to push fluids  2/11- Cr stable at 2.41- con't pushing fluids 14. Nausea/Vomiting  Scheduled Zofran 4 mg at 0630 every day to hopefully prevent nausea/vomting.  2/7- will make sure has phenergan prn.  2/8- Zofran TID scheduled.  2/9- better- con't regimen     2/10- will make sure Zofran, which is ordered to give 30 minutes prior to eating/Flagyl, will be given then-   2/11- changed timing of Flagyl/Zofran- working better- con't regimen 15. Insomnia  2/10- doesn't want to change/add meds- will move 10 PM meds to 8pm  2/11- no change, per pt, but better with meds at 8pm-  16. Severe protein malnutrition-  2/11- continue supplements and push intake if possible- nausea makes it worse- due to Flagyl    LOS: 9 days A FACE TO FACE EVALUATION WAS PERFORMED  Ioannis Schuh 05/14/2020, 11:04 AM

## 2020-05-14 NOTE — Progress Notes (Signed)
Physical Therapy Session Note  Patient Details  Name: Christina Mack MRN: [id number] Date of Birth: 29-Jan-1966  Today's Date: 05/14/2020 PT Individual Time: 1045-1130 PT Individual Time Calculation (min): 60 min   Short Term Goals: Week 1:  PT Short Term Goal 1 (Week 1): pt to demonstrate supine<>sit CGA PT Short Term Goal 1 - Progress (Week 1): Not met PT Short Term Goal 2 (Week 1): pt to demonstrate functional transfers CGA PT Short Term Goal 2 - Progress (Week 1): Not met PT Short Term Goal 3 (Week 1): pt to demonstrate ambulation 33' CGA with LRAD PT Short Term Goal 3 - Progress (Week 1): Not met PT Short Term Goal 4 (Week 1): pt to tolerate OOB positioning for 45 mins PT Short Term Goal 4 - Progress (Week 1): Not met  Skilled Therapeutic Interventions/Progress Updates:    Patient sidelying in bed upon PT arrival. Patient alert and agreeable to PT session. Pt states that she continues to suffer from lack of sleep. Nursing has offered melatonin but pt does not like to take melatonin. Recommended to pt to continue to work with MD and nursing on finding a remedy that will work for her as rest is important and necessary. Patient experienced pain from sacral wound throughout session and requires adjusting of position in TIS chair for improvement in s/s prior to moving from one activity to next.  Therapeutic Activity: Bed Mobility: Patient performed supine to sit adjusting the [facility] for herself and pushing UB with max effort requiring Min A to complete.  Transfers: Pt performed STS and SPVT transfers throughout session bed <> w/c and w/c <> RW with CGA. VC for equal effort through BLE.   Gait Training: Pt ambulated 64' x1/ 10' x1 18' x1 using RW with CGA with PT's hands at pt's hip musculature in order to palpate activation. Ambulated with slow pace, decreased step height/ length, increased BUE pressure into walker. Provided verbal cues for level gaze and heel strike followed by toe push off with  each step. Seated therapeutic rest break between each bout required. No anxiety noted during bouts this session.   Pt also guided in lateral stepping at hallway handrail x10 in each direction with slight hip and knee flexion. Pt performs well with good technique throughout and no anxiety.   Therapeutic Exercise: Patient performed the following exercises with verbal and tactile cues for proper technique. 2x10 Hip adduction in seated position with 3 sec holds into pillow 2x10 knee extension seated reclined in TIS  Patient tilted back in TIS w/c at end of session with brakes locked and OT arriving in room.   Therapy Documentation Precautions:  Precautions Precautions: Fall Precaution Comments: recent falls, wounds Restrictions Weight Bearing Restrictions: No   Therapy/Group: Individual Therapy  Alger Simons 05/14/2020, 12:46 PM

## 2020-05-14 NOTE — Progress Notes (Signed)
Inpatient Rehabilitation Care Coordinator Discharge Note  The overall goal for the admission was met for:   Discharge location: Yes-HOME WITH HUSBAND WHO WORKS DURING THE DAY AND AUNT WILL BE THERE WHILE HE IS WORKING. DAUGHTER ALSO  Length of Stay: Yes-12 DAYS  Discharge activity level: Yes-SUPERVISION-MOD/I LEVEL  Home/community participation: Yes  Services provided included: MD, RD, PT, OT, RN, CM, Pharmacy, Neuropsych and SW  Financial Services: Private Insurance: [employer] offered to/list presented to:YES  Follow-up services arranged: Home Health: [facility]-PT,OT,RN and Patient/Family request agency HH: [place], DME: HAS ALL EQUIPMENT FROM PREVIOUS ADMITS  Comments (or additional information):PT DID WELL AND WILL HAVE AUNT WITH HER WHILE HUSBAND WORKS DURING [place]. PT FELT COULD DIRECT AUNT IN WHATT SHE NEEDED DONE SINCE SHE IS A PTA  Patient/Family verbalized understanding of follow-up arrangements: Yes  Individual responsible for coordination of the follow-up plan: SELF [phone]  Confirmed correct DME delivered: Elease Hashimoto 05/14/2020    Odena Mcquaid, Gardiner Rhyme

## 2020-05-14 NOTE — Progress Notes (Signed)
Physical Therapy Session Note  Patient Details  Name: Christina Mack MRN: [id number] Date of Birth: 1966-01-26  Today's Date: 05/14/2020 PT Individual Time: 1300-1400 PT Individual Time Calculation (min): 60 min   Short Term Goals: Week 2:  PT Short Term Goal 1 (Week 2): pt to demonstrate functional transfers CGA PT Short Term Goal 2 (Week 2): pt to demonstrate supine<>sit CGA PT Short Term Goal 3 (Week 2): pt to ambulate with RW 25' CGA  Skilled Therapeutic Interventions/Progress Updates:   Pt received supine in bed and agreeable to PT. Supine>sit transfer with mod assist at trunk. Stand pivot transfer to [facility] with CGA. Pt transported to rehab gym in [facility] and obtained items to complete valentine card. Standing at high/low table with supervision assist to complete card with supervision assist x 2 bouts 2 min each. Prolonged therapeutic rest breaks between bouts. Pt returned to room and performed ambulatory transfer to bed with CGA and RW. Sit>supine completed with mod assist for BLE management, and left supine in bed with call bell in reach and all needs met.            Therapy Documentation Precautions:  Precautions Precautions: Fall Precaution Comments: recent falls, wounds Restrictions Weight Bearing Restrictions: No Pain:   denies  Therapy/Group: Individual Therapy  Lorie Phenix 05/14/2020, 4:45 PM

## 2020-05-15 DIAGNOSIS — E43 Unspecified severe protein-calorie malnutrition: Secondary | ICD-10-CM

## 2020-05-15 NOTE — Progress Notes (Addendum)
[place] PHYSICAL MEDICINE & REHABILITATION PROGRESS NOTE   Subjective/Complaints:  Pt up in bed eating. Slept pretty well. A little anxious about going home but excited.  ROS: Patient denies fever, rash, sore throat, blurred vision, nausea, vomiting, diarrhea, cough, shortness of breath or chest pain, headache, or mood change.   Objective:   No results found. Recent Labs    05/13/20 0508  WBC 7.0  HGB 8.4*  HCT 25.0*  PLT 134*   Recent Labs    05/14/20 0455  NA 132*  K 4.5  CL 101  CO2 22  GLUCOSE 81  BUN 31*  CREATININE 2.41*  CALCIUM 8.6*    Intake/Output Summary (Last 24 hours) at 05/15/2020 3244 Last data filed at 05/14/2020 1900 Gross per 24 hour  Intake 120 ml  Output --  Net 120 ml     Pressure Injury 04/20/20 Buttocks Medial;Lower;Distal Stage 2 -  Partial thickness loss of dermis presenting as a shallow open injury with a red, pink wound bed without slough. weeping and scattered on the sacrum (Active)  04/20/20 2234  Location: Buttocks  Location Orientation: Medial;Lower;Distal  Staging: Stage 2 -  Partial thickness loss of dermis presenting as a shallow open injury with a red, pink wound bed without slough.  Wound Description (Comments): weeping and scattered on the sacrum  Present on Admission: Yes     Pressure Injury 05/05/20 Sacrum Unstageable - Full thickness tissue loss in which the base of the injury is covered by slough (yellow, tan, gray, green or brown) and/or eschar (tan, brown or black) in the wound bed. yellow slough (Active)  05/05/20   Location: Sacrum  Location Orientation:   Staging: Unstageable - Full thickness tissue loss in which the base of the injury is covered by slough (yellow, tan, gray, green or brown) and/or eschar (tan, brown or black) in the wound bed.  Wound Description (Comments): yellow slough  Present on Admission: Yes    Physical Exam: Vital Signs Blood pressure (!) 132/98, pulse 99, temperature 98.7 F (37.1  C), resp. rate 20, height 5\' 5"  (1.651 m), weight 49.4 kg, SpO2 100 %. Constitutional: No distress . Vital signs reviewed. HEENT: EOMI, oral membranes moist Neck: supple Cardiovascular: RRR without murmur. No JVD    Respiratory/Chest: CTA Bilaterally without wheezes or rales. Normal effort    GI/Abdomen: BS +, non-tender, non-distended Ext: no clubbing, cyanosis, or edema Psych: pleasant and cooperative Skin: Warm and dry.   Scattered healing hematomas noted vulvar ulcer not assessed today; sacral stage II--stable Psych: appears exhausted this AM Musc: No edema in extremities.  No tenderness in extremities. Neuro: Alert Motor: Bilateral upper extremities: 5/5 proximal distal Right lower extremity: Hip flexion, knee extension 2+/5, ankle dorsiflexion 5/5 Left lower extremity: Hip flexion, knee extension 4 -/5, ankle dorsiflexion 5/5 Station intact light touch    Assessment/Plan: 1. Functional deficits which require 3+ hours per day of interdisciplinary therapy in a comprehensive inpatient rehab setting.  Physiatrist is providing close team supervision and 24 hour management of active medical problems listed below.  Physiatrist and rehab team continue to assess barriers to discharge/monitor patient progress toward functional and medical goals  Care Tool:  Bathing    Body parts bathed by patient: Right arm,Left arm,Chest,Abdomen,Front perineal area,Buttocks,Right upper leg,Left upper leg,Face   Body parts bathed by helper: Right lower leg,Left lower leg,Buttocks     Bathing assist Assist Level: Moderate Assistance - Patient 50 - 74%     Upper Body Dressing/Undressing Upper body  dressing   What is the patient wearing?: Pull over shirt    Upper body assist Assist Level: Set up assist    Lower Body Dressing/Undressing Lower body dressing    Lower body dressing activity did not occur: Refused What is the patient wearing?: Pants     Lower body assist Assist for lower  body dressing: Moderate Assistance - Patient 50 - 74%     Toileting Toileting    Toileting assist Assist for toileting: Contact Guard/Touching assist Assistive Device Comment: Bedpan   Transfers Chair/bed transfer  Transfers assist     Chair/bed transfer assist level: Contact Guard/Touching assist     Locomotion Ambulation   Ambulation assist      Assist level: Contact Guard/Touching assist Assistive device: Walker-rolling Max distance: 52 ft   Walk 10 feet activity   Assist     Assist level: Contact Guard/Touching assist Assistive device: Walker-rolling   Walk 50 feet activity   Assist    Assist level: Contact Guard/Touching assist Assistive device: Walker-rolling    Walk 150 feet activity   Assist Walk 150 feet activity did not occur: Safety/medical concerns         Walk 10 feet on uneven surface  activity   Assist Walk 10 feet on uneven surfaces activity did not occur: Safety/medical concerns         Wheelchair     Assist Will patient use wheelchair at discharge?: Yes (pt may benefit from from [facility] for home pending progression, unable to assess on eval 2/2 pt's inability to tolerate sitting in WC 2/2 pain)   Wheelchair activity did not occur: Safety/medical concerns         Wheelchair 50 feet with 2 turns activity    Assist    Wheelchair 50 feet with 2 turns activity did not occur: Safety/medical concerns       Wheelchair 150 feet activity     Assist  Wheelchair 150 feet activity did not occur: Safety/medical concerns       Blood pressure (!) 132/98, pulse 99, temperature 98.7 F (37.1 C), resp. rate 20, height 5\' 5"  (1.651 m), weight 49.4 kg, SpO2 100 %.  Medical Problem List and Plan: 1.  Deficits with balance, standing, mobility, self-care secondary to debility.            Continue CIR 2. H/o DVT/ Antithrombotics: -DVT/anticoagulation:  Pharmaceutical: Other (comment)--low dose eliquis due to nose bleeds.               -antiplatelet therapy: N/A 3. Chronic pain Management:  Methadone --2.5 mg bid since radiation completed. Continue Tylenol tid                Monitor with increased exertion  2/3- pt also using voltaren gel for stiffness/pain- pain stable per pt- will wait to increase/change meds- con't to monitor  Controlled with meds on 2/5  2/9- per therapy, pain limiting therapy, but pt doesn't want more meds- con't regimen  2/11-12- says pain control is ok LCSW to follow for evaluation and support.              -antipsychotic agents: N/A 5. Neuropsych: This patient is capable of making decisions on her own behalf. 6. Skin/Wound Care: Stage II sacral decub and R labial wounds.              - sacral decub treated with santyl and wet to dry dressing.   Ordered air mattress             -  Right labia-->calcium alginate with dry dressing daily.  2/9- pt refusing packing of vulvar wound- con't dressings as able  2/11- on air mattress- back hurting, but thinks it would be from any mattress, since in bed so long.  7. Fluids/Electrolytes/Nutrition: Monitor I/Os. Continue supplements--reports that intake improving.   8. 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 Higher dose Daptomycin-->Tedezolid thorough 3/16.              Flagyl/Cipro thorough 2/26 to complete course of 6 total weeks  Weekly CK, CBC, BMP  2/7- getting severe nausea/vomiting from Flagyl- added Zofran scheduled in AM- seems to help to eat, THEN take Flagyl?  2/9- doing better with nausea/vomiting by taking flagyl after meals and scheduled zofran with meals 9. Thrombocytopenia: Platelets 120 (baseline)             Platelets 111 on 2/4, labs ordered for Monday  2/8- Plts stable at 123k 10. Candida esophagitis: On Diflucan thorough 2/12 to compl 11. Gastric ulcer: To continue protonix bid X 6 weeks.               --Continue to monitor H/H with serial checks.              --transfuse prn hgb< 7.0              See #12 12. Acute on chronic anemia:  Transfused 1/21 and 2/1 with improvement.              Hemoglobin 7.6 on 7/4, labs ordered for Monday  2/7- Hb 6.6- pt wants to recheck and THEN transfuse tomorrow if still low  2/8- Hb 6.3- will transfuse today 2 units  2/9- Hb 8.2- up from 1 unit pRBCs- cannot get another per blood director 13. CKD s/p renal transplant:             Creatinine 2.54 on 2/4, labs ordered for Monday  2/7- Cr 2.61- slowly creeping up- con't to push PO fluids-   2/9- Cr slightly better- 2.46- con't to push fluids  2/11- Cr stable at 2.41- con't pushing fluids 14. Nausea/Vomiting  Scheduled Zofran 4 mg at 0630 every day to hopefully prevent nausea/vomting.  2/7- will make sure has phenergan prn.  2/8- Zofran TID scheduled.  2/9- better- con't regimen     2/10- will make sure Zofran, which is ordered to give 30 minutes prior to eating/Flagyl, will be given then-   2/11-12- changed timing of Flagyl/Zofran- seems improved 15. Insomnia  2/10- doesn't want to change/add meds- will move 10 PM meds to 8pm  2/12--pt slept fairly well last night  16. Severe protein malnutrition-  2/11- continue supplements and push intake if possible- nausea makes it worse- due to Flagyl    LOS: 10 days A FACE TO FACE EVALUATION WAS PERFORMED  Meredith Staggers 05/15/2020, 9:52 AM

## 2020-05-15 NOTE — Progress Notes (Signed)
Physical Therapy Session Note  Patient Details  Name: Christina Mack MRN: [id number] Date of Birth: Sep 08, 1965  Today's Date: 05/15/2020 PT Individual Time: 0804-0905 PT Individual Time Calculation (min): 61 min   Short Term Goals: Week 1:  PT Short Term Goal 1 (Week 1): pt to demonstrate supine<>sit CGA PT Short Term Goal 1 - Progress (Week 1): Not met PT Short Term Goal 2 (Week 1): pt to demonstrate functional transfers CGA PT Short Term Goal 2 - Progress (Week 1): Not met PT Short Term Goal 3 (Week 1): pt to demonstrate ambulation 21' CGA with LRAD PT Short Term Goal 3 - Progress (Week 1): Not met PT Short Term Goal 4 (Week 1): pt to tolerate OOB positioning for 45 mins PT Short Term Goal 4 - Progress (Week 1): Not met Week 2:  PT Short Term Goal 1 (Week 2): pt to demonstrate functional transfers CGA PT Short Term Goal 2 (Week 2): pt to demonstrate supine<>sit CGA PT Short Term Goal 3 (Week 2): pt to ambulate with RW 25' CGA  Skilled Therapeutic Interventions/Progress Updates:  Patient supine in bed upon PT arrival. Patient alert and agreeable to PT session. Patient c/o mild pain in R knee during session that is alleviated with repositioning of leg rest and heat to Bil knees.  Therapeutic Activity: Bed Mobility: Patient performed supine --> sit with Min A for UB to reach seated position. VC for pushing with BUE from bed surface. Sit--> supine performed with Min A for BLE to bed surface. VC for increase effort from pt to lift BLE to reach bed.  Transfers: Patient performed STS throughout session with supervision. SPVT required BUE support to RW and CGA for pt's anxiety and weakness. Toilet transfer to [facility] with CGA for balance and weakness. Standing balance challenged in LB dressing in standing with no UE support. No LOB or increase in sway noted.   Gait Training:  Patient ambulated short distances in room using RW and CGA/ supervision to/ from bed, w/c and BSC. Pt also guided in gait  training using floor ladder for stepping drills to address balance and increasing step height/ length. Guided in forward step to into each square with use of RW and CGA out ladder and decreased amount of CGA on return trip. Forward/ backward and lateral stepping using HHA with increase in pt's anxiety and noted increase in BLE fatigue.   Pt able to ascend/ descend four 6" steps using BHR and CGA with heavy BUE support from pt into forearms on HR. Good control of BLE and no fatigue or anxiety noted during performance.  Therapeutic seated rest breaks  Reclined in TIS w/c required between all bouts of activity.  Patient supine in bed at end of session with all needs within reach.   Therapy Documentation Precautions:  Precautions Precautions: Fall Precaution Comments: recent falls, wounds Restrictions Weight Bearing Restrictions: No   Therapy/Group: Individual Therapy  Alger Simons 05/15/2020, 8:53 AM

## 2020-05-16 NOTE — Progress Notes (Signed)
Physical Therapy Note  Patient Details  Name: Christina Mack MRN: [id number] Date of Birth: [birth year] Today's Date: 05/16/2020    Pt missed 60 min of skilled PT session. Pt with eyes closed and minimally engaging with therapist. When asked about planned d/c for tomorrow and use of this session to address discharge therapy needs and goals, pt just said "supposed to go home" and kept eyes closed. Will attempt to follow up later if able.  Canary Brim Ivory Broad, PT, DPT, CBIS  05/16/2020, 11:16 AM

## 2020-05-16 NOTE — Progress Notes (Signed)
This nurse informed by patient that patient's husband was tested positive in the emergency department on Saturday. 05/15/20. Patient husband visited on Thursday 05/13/20 and briefly removed mask to " take a bite of what I had to eat". This nurse informed charge nurse and infection prevention was notified. Infection prevention suggests initiation of Airborne and contact precautions pending testing on day 3 or day 5. Infection prevention to call back and advise on protocol when next steps are verified. Patient emergency contact:[facility] notified of the precaution changes and possible testing schedules. Family members advised to monitor for symptoms.

## 2020-05-16 NOTE — Progress Notes (Signed)
Infection prevention called back and advised to test patient for Covid 19 on Monday morning before she is discharged from the hospital

## 2020-05-16 NOTE — Discharge Summary (Signed)
Occupational Therapy Discharge Summary  Patient Details  Name: DEKOTA KIRLIN MRN: [id number] Date of Birth: 07/20/1965  Patient has met 6 of 9 long term goals due to improved activity tolerance, improved balance, postural control and improved coordination.  Patient to discharge at [facility] Supervision- [place] level.  Patient is able to direct her care in regards to BADL needs. Pt reports her aunt can provide the necessary 24/7 assistance at discharge.    3 out of 9 goals were unable to be met due to pts slow progress and ongoing knee/periarea pain. Pt reports family can provide the needed assistance at time of d/c  Recommendation:  Patient will benefit from ongoing skilled OT services in home health setting to continue to advance functional skills in the area of BADL and iADL.  Equipment: No equipment provided  Reasons for discharge: discharge from hospital  Patient/family agrees with progress made and goals achieved: Yes  OT Discharge ADL ADL Eating: Set up Where Assessed-Eating: Bed level Grooming: Setup Where Assessed-Grooming: Bed level Upper Body Bathing: Setup Where Assessed-Upper Body Bathing: Edge of bed Lower Body Bathing: Moderate assistance Where Assessed-Lower Body Bathing: Edge of bed Upper Body Dressing: Setup Where Assessed-Upper Body Dressing: Edge of bed Lower Body Dressing: Moderate assistance Where Assessed-Lower Body Dressing: Edge of bed Toileting: Moderate assistance Where Assessed-Toileting: Bedside Commode Toilet Transfer: Close supervision Toilet Transfer Method: Stand pivot (RW) Toilet Transfer Equipment: Radiographer, therapeutic: Not assessed ADL Comments: able to now tolerate OOB toileting with cushioned BSC, pt unable to tolerate OOB toileting at time of eval due to wound pain Perception  Perception: Within Functional Limits Praxis Praxis: Intact Cognition Overall Cognitive Status: Within Functional Limits for tasks  assessed Orientation Level: Oriented X4 Safety/Judgment: Appears intact Sensation Coordination Gross Motor Movements are Fluid and Coordinated: Yes Fine Motor Movements are Fluid and Coordinated: Yes Finger Nose Finger Test: [facility] Motor  Motor Motor: Within Functional Limits Motor - Discharge Observations: Pt still presents with general weakness/debility due to CA diagnosis, functional OOB tolerance improved since time of eval Balance Balance Balance Assessed: Yes Dynamic Sitting Balance Dynamic Sitting - Balance Support: Feet supported;No upper extremity supported Dynamic Sitting - Level of Assistance: 5: Stand by assistance Dynamic Standing Balance Dynamic Standing - Balance Support: During functional activity Dynamic Standing - Level of Assistance: 5: Stand by assistance (Toileting tasks) Dynamic Standing - Balance Activities: Lateral lean/weight shifting;Forward lean/weight shifting Extremity/Trunk Assessment RUE Assessment RUE Assessment: Within Functional Limits LUE Assessment LUE Assessment: Within Functional Limits   Aniesa Boback A Kyaire Gruenewald 05/16/2020, 4:02 PM

## 2020-05-16 NOTE — Progress Notes (Signed)
Occupational Therapy Session Note  Patient Details  Name: Christina Mack MRN: [id number] Date of Birth: 18-Dec-1965  Today's Date: 05/16/2020 OT Individual Time: 1000-1040 OT Individual Time Calculation (min): 40 min   Short Term Goals: Week 2:  OT Short Term Goal 1 (Week 2): STGs = LTGs d/t ELOS  Skilled Therapeutic Interventions/Progress Updates:    Pt greeted in bed with no c/o pain, reporting that her husband was just admitted to the hospital after sustaining an injury at home. Active listening used therapeutically at this time, also provided pt with aromatherapy to address emotional health. She reports that she believes her aunt can stay with her 24/7 temporarily until spouse can assist with splitting supervision needs. She reported needing to use the [facility]. Setup for bed mobility with pt reporting that she has an adjustable bed at home and uses her dresser for support often. Close supervision for stand pivot<BSC using RW where pt completed 3/3 components of toileting with +bladder void. Pt also changed out her ABD pad. She ambulated short distance around bed with supervision-CGA using device to simulate bed transfer at home. She reports using a 6 inch step to transfer into bed. OT was only able to find a 5 inch step and pt "crawled" into bed after side-stepping onto step with CGA, using the "dresser" for support. Reminded pt that 6 inches will be more difficult and it would be wise to have modification ideas if she was unable to do this safely at home. Pt reported she can lower the stool if needed. OT also showed her the back-up method using RW which pt appreciated. Pt reported "sliding" out of bed vs using the stool and we also practiced this with slightly elevated bed, very small slide using RW with CGA which appeared safe to this therapist. She remained in sidelying position at close of session, all needs within reach.   Therapy Documentation Precautions:  Precautions Precautions:  Fall Precaution Comments: recent falls, wounds Restrictions Weight Bearing Restrictions: No ADL: ADL Eating: Set up Where Assessed-Eating: Bed level Grooming: Setup Where Assessed-Grooming: Bed level Upper Body Bathing: Setup Where Assessed-Upper Body Bathing: Edge of bed Lower Body Bathing: Moderate assistance Where Assessed-Lower Body Bathing: Edge of bed Upper Body Dressing: Setup Where Assessed-Upper Body Dressing: Edge of bed Lower Body Dressing: Moderate assistance Where Assessed-Lower Body Dressing: Edge of bed Toileting: Moderate assistance Where Assessed-Toileting: Bedside Commode Toilet Transfer: Close supervision Toilet Transfer Method: Stand pivot (RW) Toilet Transfer Equipment: Bedside commode Tub/Shower Transfer: Not assessed ADL Comments: SPT to/from w/c, commode with min A but unable to tolerate sitting at this time due to pain - she opts to use bed pan due to ongoing pain      Therapy/Group: Individual Therapy  Jovannie Ulibarri A Marc Leichter 05/16/2020, 4:24 PM

## 2020-05-16 NOTE — Progress Notes (Signed)
Physical Therapy Session Note  Patient Details  Name: Christina Mack MRN: [id number] Date of Birth: 1966/01/24  Today's Date: 05/16/2020 PT Individual Time: 1350-1420 PT Individual Time Calculation (min): 30 min   Short Term Goals: Week 1:  PT Short Term Goal 1 (Week 1): pt to demonstrate supine<>sit CGA PT Short Term Goal 1 - Progress (Week 1): Not met PT Short Term Goal 2 (Week 1): pt to demonstrate functional transfers CGA PT Short Term Goal 2 - Progress (Week 1): Not met PT Short Term Goal 3 (Week 1): pt to demonstrate ambulation 21' CGA with LRAD PT Short Term Goal 3 - Progress (Week 1): Not met PT Short Term Goal 4 (Week 1): pt to tolerate OOB positioning for 45 mins PT Short Term Goal 4 - Progress (Week 1): Not met Week 2:  PT Short Term Goal 1 (Week 2): pt to demonstrate functional transfers CGA PT Short Term Goal 2 (Week 2): pt to demonstrate supine<>sit CGA PT Short Term Goal 3 (Week 2): pt to ambulate with RW 25' CGA  Skilled Therapeutic Interventions/Progress Updates:    Pt seen again to make up time from missed session earlier. Pt alert and agreeable to activity in the room. Bed mobility with use of rail for supine to sit with CGA/min assist to come to EOB and extra time. Performed CGA transfers with RW to and from padded tub bench with cut-out for toileting needs including CGA for dynamic standing balance for clothing management. Functional gait in room for activity tolerance and general strengthening x 20' with CGA with RW including turns and obstacle negotiation. Pt requires mod assist to return to supine in the bed for BLE management. Engaged in supine therex for functional strengthening and muscular endurance to aid with overall mobility including AARO for heel slides x 10 reps BLE, bridges x 10 reps, clamshells x 10 reps BLE, and x5 reps each BLE of assisted SLRs. Pt discussed fatigue level and encouraged small bouts of mobility upon d/c at home. Pt expressed concern over her  husband being in the hospital now but reports her aunt will be available to assist her upon d/c tomorrow. Pt denies any concerns in regards to mobility level and declines practicing anything else outside of room to prepare for discharge. Emotional support and encouragement provided throughout session.   Therapy Documentation Precautions:  Precautions Precautions: Fall Precaution Comments: recent falls, wounds Restrictions Weight Bearing Restrictions: No   Pain: Generalized discomfort and fatigue. No intervention needed.   Therapy/Group: Individual Therapy  Canary Brim Ivory Broad, PT, DPT, CBIS  05/16/2020, 2:25 PM

## 2020-05-17 ENCOUNTER — Other Ambulatory Visit (HOSPITAL_COMMUNITY): Payer: Self-pay | Admitting: Physical Medicine and Rehabilitation

## 2020-05-17 ENCOUNTER — Telehealth: Payer: Self-pay

## 2020-05-17 LAB — BASIC METABOLIC PANEL
Anion gap: 11 (ref 5–15)
BUN: 28 mg/dL — ABNORMAL HIGH (ref 6–20)
CO2: 22 mmol/L (ref 22–32)
Calcium: 8.7 mg/dL — ABNORMAL LOW (ref 8.9–10.3)
Chloride: 99 mmol/L (ref 98–111)
Creatinine, Ser: 2.26 mg/dL — ABNORMAL HIGH (ref 0.44–1.00)
GFR, Estimated: 25 mL/min — ABNORMAL LOW (ref >60)
Glucose, Bld: 89 mg/dL (ref 70–99)
Potassium: 4.8 mmol/L (ref 3.5–5.1)
Sodium: 132 mmol/L — ABNORMAL LOW (ref 135–145)

## 2020-05-17 LAB — RESP PANEL BY RT-PCR (FLU A&B, COVID) ARPGX2
Influenza A by PCR: NEGATIVE
Influenza B by PCR: NEGATIVE
SARS Coronavirus 2 by RT PCR: NEGATIVE

## 2020-05-17 LAB — CBC
HCT: 24.6 % — ABNORMAL LOW (ref 36.0–46.0)
Hemoglobin: 7.8 g/dL — ABNORMAL LOW (ref 12.0–15.0)
MCH: 29.5 pg (ref 26.0–34.0)
MCHC: 31.7 g/dL (ref 30.0–36.0)
MCV: 93.2 fL (ref 80.0–100.0)
Platelets: 139 10*3/uL — ABNORMAL LOW (ref 150–400)
RBC: 2.64 MIL/uL — ABNORMAL LOW (ref 3.87–5.11)
RDW: 17 % — ABNORMAL HIGH (ref 11.5–15.5)
WBC: 5.3 10*3/uL (ref 4.0–10.5)
nRBC: 0 % (ref 0.0–0.2)

## 2020-05-17 MED ORDER — APIXABAN 2.5 MG PO TABS: 2.5000 mg | ORAL_TABLET | Freq: Every day | ORAL | 0 refills | Status: DC

## 2020-05-17 MED ORDER — SALINE SPRAY 0.65 % NA SOLN: 1.0000 | Freq: Three times a day (TID) | NASAL | 0 refills | Status: DC

## 2020-05-17 MED ORDER — POLYETHYLENE GLYCOL 3350 17 G PO PACK: 17.0000 g | PACK | Freq: Every day | ORAL | 0 refills | Status: DC

## 2020-05-17 MED ORDER — METHADONE HCL 5 MG PO TABS: 2.5000 mg | ORAL_TABLET | Freq: Two times a day (BID) | ORAL | 0 refills | Status: DC | PRN

## 2020-05-17 MED ORDER — SENNA 8.6 MG PO TABS: 2.0000 | ORAL_TABLET | Freq: Every day | ORAL | 0 refills | Status: DC

## 2020-05-17 MED ORDER — SODIUM BICARBONATE 650 MG PO TABS: 1300.0000 mg | ORAL_TABLET | Freq: Two times a day (BID) | ORAL | 0 refills | Status: DC

## 2020-05-17 MED ORDER — PANTOPRAZOLE SODIUM 40 MG PO TBEC: 40.0000 mg | DELAYED_RELEASE_TABLET | Freq: Two times a day (BID) | ORAL | 0 refills | Status: DC

## 2020-05-17 MED ORDER — LEVOFLOXACIN 500 MG PO TABS: 500.0000 mg | ORAL_TABLET | ORAL | 0 refills | Status: DC

## 2020-05-17 MED ORDER — COLLAGENASE 250 UNIT/GM EX OINT: TOPICAL_OINTMENT | Freq: Every day | CUTANEOUS | 0 refills | Status: DC

## 2020-05-17 MED ORDER — OXYMETAZOLINE HCL 0.05 % NA SOLN
1.0000 | Freq: Two times a day (BID) | NASAL | 0 refills | Status: DC | PRN
Start: 2020-05-17 — End: 2020-11-22

## 2020-05-17 MED ORDER — METHADONE HCL 5 MG PO TABS: 2.5000 mg | ORAL_TABLET | Freq: Two times a day (BID) | ORAL | 0 refills | Status: DC

## 2020-05-17 MED ORDER — ONDANSETRON HCL 4 MG PO TABS: 4.0000 mg | ORAL_TABLET | Freq: Three times a day (TID) | ORAL | 0 refills | Status: DC

## 2020-05-17 MED ORDER — METRONIDAZOLE 500 MG PO TABS: 500.0000 mg | ORAL_TABLET | Freq: Three times a day (TID) | ORAL | 0 refills | Status: DC

## 2020-05-17 MED ORDER — LORATADINE 10 MG PO TABS: 10.0000 mg | ORAL_TABLET | Freq: Every day | ORAL | Status: DC

## 2020-05-17 MED FILL — POLYETHYLENE GLYCOL 3350 PO: 17 | 30 days supply | Qty: 510 | Fill #0

## 2020-05-17 MED FILL — ELIQUIS 2.5 MG TABLET: 2.5 | 30 days supply | Qty: 60 | Fill #0

## 2020-05-17 MED FILL — PANTOPRAZOLE SOD DR 40 MG T: 40 | 30 days supply | Qty: 60 | Fill #0

## 2020-05-17 MED FILL — levoFLOXacin 500 MG TABS: 500 | 12 days supply | Qty: 6 | Fill #0

## 2020-05-17 MED FILL — SODIUM BICARBONATE 650 MG T: 650 | 30 days supply | Qty: 120 | Fill #0

## 2020-05-17 MED FILL — metroNIDAZOLE 500 MG TABS: 500 | 12 days supply | Qty: 42 | Fill #0

## 2020-05-17 MED FILL — SENNA 8.6 MG TABS: 8.6 | 30 days supply | Qty: 60 | Fill #0

## 2020-05-17 MED FILL — METHADONE HCL 5 MG TABLET: 5 | 30 days supply | Qty: 30 | Fill #0

## 2020-05-17 MED FILL — ONDANSETRON HCL 4 MG TABLET: 4 | 15 days supply | Qty: 90 | Fill #0

## 2020-05-17 NOTE — Discharge Instructions (Signed)
Inpatient Rehab Discharge Instructions  Christina Mack Discharge date and time:  05/17/2020  Activities/Precautions/ Functional Status: Activity: no lifting, driving, or strenuous exercise till cleared by MD Diet: regular diet Wound Care: Cleanse with normal saline. Pack groin wound with alginate and dry dressing. Change daily or if wet.             -- to yellow area on sacral bony prominence-->apply santyl and damp to dry dressing. Change daily and more frequently if soiled.    Functional status:  ___ No restrictions     ___ Walk up steps independently _X__ 24/7 supervision/assistance   ___ Walk up steps with assistance ___ Intermittent supervision/assistance  ___ Bathe/dress independently ___ Walk with walker     _X__ Bathe/dress with assistance ___ Walk Independently    ___ Shower independently ___ Walk with assistance    ___ Shower with assistance _X__ No alcohol     ___ Return to work/school ________   Special Instructions:    COMMUNITY REFERRALS UPON DISCHARGE:    Home Health:   PT, OT, RN                Agency:[facility] Phone:[phone]  Medical Equipment/Items Ordered: HAS ALL NEEDED EQUIPMENT FROM PAST ADMISSIONS                                                    My questions have been answered and I understand these instructions. I will adhere to these goals and the provided educational materials after my discharge from the hospital.  Patient/Caregiver Signature _______________________________ Date __________  Clinician Signature _______________________________________ Date __________  Please bring this form and your medication list with you to all your follow-up doctor's appointments.
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
## 2020-05-17 NOTE — Telephone Encounter (Signed)
Christina Mack that Ms Eagon's appointment was R/S from tomorrow 05-18-20  to 06-01-20 at 0945 with 0915 arrival as Dr. Berline Lopes wants her to have some time to recover from the hospitalization. Vonna Kotyk will give this information to Ms Stonehocker.with discharge.

## 2020-05-17 NOTE — Plan of Care (Signed)
  Problem: RH Balance Goal: LTG: Patient will maintain dynamic sitting balance (OT) Description: LTG:  Patient will maintain dynamic sitting balance with assistance during activities of daily living (OT) Outcome: Not Met (add Reason) Note: Pt still requires supervision for dynamic sitting at time of d/c   Problem: RH Bathing Goal: LTG Patient will bathe all body parts with assist levels (OT) Description: LTG: Patient will bathe all body parts with assist levels (OT) Outcome: Not Met (add Reason) Note: Pt requires Mod A for bathing at time of d/c, pt states her family can provide this needed assistance   Problem: RH Dressing Goal: LTG Patient will perform lower body dressing w/assist (OT) Description: LTG: Patient will perform lower body dressing with assist, with/without cues in positioning using equipment (OT) Outcome: Not Met (add Reason) Note: Pt still requires Mod A at time of d/c, pt states her family can provide this needed assistance   Problem: RH Balance Goal: LTG Patient will maintain dynamic standing with ADLs (OT) Description: LTG:  Patient will maintain dynamic standing balance with assist during activities of daily living (OT)  Outcome: Completed/Met   Problem: Sit to Stand Goal: LTG:  Patient will perform sit to stand in prep for activites of daily living with assistance level (OT) Description: LTG:  Patient will perform sit to stand in prep for activites of daily living with assistance level (OT) Outcome: Completed/Met   Problem: RH Grooming Goal: LTG Patient will perform grooming w/assist,cues/equip (OT) Description: LTG: Patient will perform grooming with assist, with/without cues using equipment (OT) Outcome: Completed/Met   Problem: RH Dressing Goal: LTG Patient will perform upper body dressing (OT) Description: LTG Patient will perform upper body dressing with assist, with/without cues (OT). Outcome: Completed/Met   Problem: RH Toileting Goal: LTG Patient  will perform toileting task (3/3 steps) with assistance level (OT) Description: LTG: Patient will perform toileting task (3/3 steps) with assistance level (OT)  Outcome: Completed/Met   Problem: RH Toilet Transfers Goal: LTG Patient will perform toilet transfers w/assist (OT) Description: LTG: Patient will perform toilet transfers with assist, with/without cues using equipment (OT) Outcome: Completed/Met
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
## 2020-05-17 NOTE — Progress Notes (Signed)
Physical Therapy Discharge Summary  Patient Details  Name: Christina Mack MRN: [id number] Date of Birth: 05-14-1965  Today's Date: 05/17/2020      Patient has met 4 of 10 long term goals due to improved activity tolerance, improved balance, increased strength and decreased pain.  Patient to discharge at an ambulatory level [place].   Patient's care partner is independent to provide the necessary physical assistance at discharge.  Reasons goals not met: unfortunately pt continues to be limited by nausea and pain 2/2 medical conditions which limits ability to participate in therapy. Pt has been motivated throughout admission to attempt and improve but is limited to above reasons. Pt has improved since admission but is not supervision level currently and grossly CGA-min A for all needs. Pt will have 24/7 assist at home for all needs. Pt initially reported 7 stairs to enter home however then reported and during admission only agreeable to ascending and descending 3 stairs min A-CGA.  Recommendation:  Patient will benefit from ongoing skilled PT services in home health setting to continue to advance safe functional mobility, address ongoing impairments in gait training, stair training, standing balance, and minimize fall risk.  Equipment: No equipment provided  Reasons for discharge: discharge from hospital  Patient/family agrees with progress made and goals achieved: Yes  PT Discharge Precautions/Restrictions Precautions Precautions: Fall Precaution Comments: recent falls, wounds Restrictions Weight Bearing Restrictions: No Vital Signs Therapy Vitals Temp: 98.8 F (37.1 C) Pulse Rate: 99 Resp: 20 BP: (!) 149/100 Patient Position (if appropriate): Lying Oxygen Therapy SpO2: 100 % O2 Device: Room Air Pain   Vision/Perception     Cognition Overall Cognitive Status: Within Functional Limits for tasks assessed Arousal/Alertness: Awake/alert Focused Attention: Appears  intact Sustained Attention: Appears intact Memory: Appears intact Reasoning: Appears intact Sequencing: Appears intact Safety/Judgment: Appears intact Sensation Sensation Light Touch: Appears Intact Coordination Gross Motor Movements are Fluid and Coordinated: Yes Fine Motor Movements are Fluid and Coordinated: Yes Finger Nose Finger Test: [facility] Motor     Mobility Bed Mobility Bed Mobility: Rolling Right;Rolling Left;Left Sidelying to Sit;Sit to Sidelying Left Rolling Right: Independent with assistive device Rolling Left: Independent with assistive device Left Sidelying to Sit: Contact Guard/Touching assist Sit to Sidelying Left: Minimal Assistance - Patient > 75% Transfers Transfers: Sit to Stand;Stand to Sit;Stand Pivot Transfers Sit to Stand: Contact Guard/Touching assist Stand to Sit: Contact Guard/Touching assist Stand Pivot Transfers: Contact Guard/Touching assist Transfer (Assistive device): Rolling walker Locomotion  Gait Ambulation: Yes Gait Assistance: Contact Guard/Touching assist Gait Distance (Feet): 52 Feet Assistive device: Rolling walker Gait Assistance Details: Verbal cues for technique;Verbal cues for gait pattern Gait Gait: Yes Gait Pattern: Trunk flexed;Step-through pattern;Decreased stride length Gait velocity: decreased Stairs / Additional Locomotion Stairs: Yes Stairs Assistance: Minimal Assistance - Patient > 75% Stair Management Technique: Two rails Number of Stairs: 3 Height of Stairs: 6 Wheelchair Mobility Wheelchair Mobility: No  Trunk/Postural Assessment  Cervical Assessment Cervical Assessment: Within Functional Limits Postural Control Postural Control: Within Functional Limits  Balance Balance Balance Assessed: Yes Dynamic Sitting Balance Dynamic Sitting - Balance Support: Feet supported;No upper extremity supported Dynamic Sitting - Level of Assistance: 5: Stand by assistance Dynamic Standing Balance Dynamic Standing - Balance  Support: During functional activity Dynamic Standing - Level of Assistance: 5: Stand by assistance (Toileting tasks) Dynamic Standing - Balance Activities: Lateral lean/weight shifting;Forward lean/weight shifting Extremity Assessment      RLE Assessment RLE Assessment: Exceptions to [facility] General Strength Comments: grossly 4/5 LLE Assessment General Strength  Comments: grossly 4/5    Junie Panning 05/17/2020, 8:33 AM

## 2020-05-18 ENCOUNTER — Ambulatory Visit: Admitting: Gynecologic Oncology

## 2020-05-18 DIAGNOSIS — Z20822 Contact with and (suspected) exposure to covid-19: Secondary | ICD-10-CM

## 2020-05-18 DIAGNOSIS — Z79899 Other long term (current) drug therapy: Secondary | ICD-10-CM

## 2020-05-18 NOTE — Progress Notes (Signed)
[place] PDMP was reviewed for the past year prior to Rx Methadone 5 mg --#30/no refills--1/2 tab bid prn for cancer pain.

## 2020-05-19 ENCOUNTER — Ambulatory Visit: Admitting: Gynecologic Oncology

## 2020-05-19 ENCOUNTER — Telehealth (HOSPITAL_COMMUNITY): Payer: Self-pay

## 2020-05-19 ENCOUNTER — Telehealth: Payer: Self-pay | Admitting: *Deleted

## 2020-05-19 NOTE — Telephone Encounter (Signed)
Patient is doing well since discharge. She has been on eliquis 2.5mg  since October 2021 so she has already been over the medication multiple times. She did want any her refills sent to her home pharmacy in [facility] on [address]. Her profile however doesn't have any refills from [place] at this time.  Patient also has more follow ups scheduled with Infectious disease and Oncology next month in March.

## 2020-05-19 NOTE — Telephone Encounter (Signed)
Frankie PT [facility] called for POC 1wk1, 2wk2, 1 wk3.  Approval given.

## 2020-05-20 ENCOUNTER — Other Ambulatory Visit (HOSPITAL_COMMUNITY): Payer: Self-pay

## 2020-05-20 ENCOUNTER — Telehealth: Payer: Self-pay | Admitting: *Deleted

## 2020-05-20 ENCOUNTER — Other Ambulatory Visit: Payer: Self-pay

## 2020-05-20 DIAGNOSIS — M86151 Other acute osteomyelitis, right femur: Secondary | ICD-10-CM

## 2020-05-20 NOTE — Addendum Note (Signed)
Addended by: Carlean Purl on: 05/20/2020 02:54 PM   Modules accepted: Orders

## 2020-05-20 NOTE — Addendum Note (Signed)
Addended by: [place] Callas on: 05/20/2020 02:56 PM   Modules accepted: Orders

## 2020-05-20 NOTE — Telephone Encounter (Signed)
Brandon OT from [facility] called to get approval for his POC 2wk6.  Approval given.

## 2020-05-24 ENCOUNTER — Telehealth: Payer: Self-pay

## 2020-05-24 NOTE — Telephone Encounter (Signed)
Donita, RN from [facility] verbal orders for [facility] 1wk9. Change order sacral wound clean with normal saline, apply silver alginate, cover with foam dressing x2 times a week. Ordrs given and approved.

## 2020-05-25 ENCOUNTER — Other Ambulatory Visit

## 2020-05-27 ENCOUNTER — Other Ambulatory Visit

## 2020-05-31 ENCOUNTER — Other Ambulatory Visit: Payer: Self-pay

## 2020-05-31 DIAGNOSIS — Z1283 Encounter for screening for malignant neoplasm of skin: Secondary | ICD-10-CM | POA: Insufficient documentation

## 2020-05-31 DIAGNOSIS — C519 Malignant neoplasm of vulva, unspecified: Secondary | ICD-10-CM

## 2020-05-31 NOTE — Progress Notes (Signed)
MU reviewed.

## 2020-06-01 ENCOUNTER — Telehealth: Payer: Self-pay | Admitting: *Deleted

## 2020-06-01 ENCOUNTER — Ambulatory Visit: Admitting: Gynecologic Oncology

## 2020-06-01 DIAGNOSIS — C519 Malignant neoplasm of vulva, unspecified: Secondary | ICD-10-CM

## 2020-06-01 NOTE — Telephone Encounter (Addendum)
Patient called and rescheduled her new patient appt from today to 3/15 at 10:30 am. Patient offered appts for next week, patient declined. Patient stated she is nausea and has the dry heaves.

## 2020-06-08 ENCOUNTER — Ambulatory Visit (INDEPENDENT_AMBULATORY_CARE_PROVIDER_SITE_OTHER): Admitting: Infectious Diseases

## 2020-06-08 ENCOUNTER — Other Ambulatory Visit: Payer: Self-pay

## 2020-06-08 VITALS — BP 115/78 | HR 123 | Temp 98.0°F | Ht 64.0 in | Wt 96.0 lb

## 2020-06-08 DIAGNOSIS — D071 Carcinoma in situ of vulva: Secondary | ICD-10-CM

## 2020-06-08 DIAGNOSIS — S31109S Unspecified open wound of abdominal wall, unspecified quadrant without penetration into peritoneal cavity, sequela: Secondary | ICD-10-CM | POA: Diagnosis not present

## 2020-06-08 DIAGNOSIS — Z792 Long term (current) use of antibiotics: Secondary | ICD-10-CM

## 2020-06-08 DIAGNOSIS — D696 Thrombocytopenia, unspecified: Secondary | ICD-10-CM

## 2020-06-08 DIAGNOSIS — M86152 Other acute osteomyelitis, left femur: Secondary | ICD-10-CM

## 2020-06-08 DIAGNOSIS — R110 Nausea: Secondary | ICD-10-CM

## 2020-06-08 DIAGNOSIS — L89152 Pressure ulcer of sacral region, stage 2: Secondary | ICD-10-CM

## 2020-06-08 NOTE — Assessment & Plan Note (Signed)
GYN ONC team to see her as new patient on 3/18.

## 2020-06-08 NOTE — Assessment & Plan Note (Signed)
Continue wound care as ordered with santyl and calcium alginate - drainage is described to mostly be thin and yellow in appearance. I would like for her to be seen with a wound care clinic longer term; home health is managing for now. GYN ONC team to see her as new patient on 3/18.

## 2020-06-08 NOTE — Progress Notes (Signed)
Patient: Christina Mack  DOB: [birth year] MRN: [id number] PCP: Bonnita Nasuti, MD    Subjective:  Christina Mack is a 55 y.o. 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 She is on chronic immunosuppression for kidney transplant.   Wound has been present since at least October 2021. Admitted to [facility] in January 2022 with bacteremia with bacteroides and eggerthella species. She received 3 weeks of PO levaquin and flagyl after having a severe thrombocytopenia reaction to zosyn. She was readmitted to [facility] to evaluate involuntary muscle movements. In the hospital she completed the previous recommendations of antibiotics and we were consulted to evaluate further.   MR of the pelvis was obtained d/t significant left hip pain - this revealed multilocular fluid collections extending into the inferior left pelvis soft tissues and surrounding pubic rami extending up to the anterior thigh c/w abscess with osteomyelitis suggested in the anterior left pubic symphysis. Surgery teams evaluated (gyn, ortho and plastics) all stating no surgery intervention at this time. Aspiration of fluid was obtained through IR and grew out enterococcus faecium (VRE). She was given Daptomycin IV inpatient until discharge and transitioned to [facility] to complete 6 weeks (this will be March 16th). She completed 6 weeks of Levaquin 500 mg Q48h + Metronidazole 500 mg TID February 26th.     HPI since Hospital D/C:  Her Aunt, Christina Mack is with her today. Nausea has been much better since stopping the Levaquin. Reports that the wound looks much better. Her home health team showed her a photo of it recently. Still using santyl and calcium alginate packing. Ms. Cropley does most of her own wound care but Christina Mack has been helping take care of her too.  Her left hip pain has improved significantly to where she can now walk around more efficiently. Stairs are still a problem for her however  and require the most work for her. Bruising has improved - no new spots at least.   She has had to reschedule her new patient visit with GYN ONC - the day she was due to see them she had severe/extreme nausea and unable to leave the house.    Review of Systems  Constitutional: Positive for malaise/fatigue. Negative for chills and fever.  HENT: Negative for tinnitus.   Eyes: Negative for blurred vision and photophobia.  Respiratory: Negative for cough and sputum production.   Cardiovascular: Negative for chest pain.  Gastrointestinal: Positive for nausea and vomiting. Negative for diarrhea.  Genitourinary: Negative for dysuria.  Musculoskeletal: Positive for joint pain.  Skin: Positive for rash (petechial rash/bruising).  Neurological: Positive for weakness. Negative for headaches.     Past Medical History:  Diagnosis Date  . Complication of anesthesia    nausea and vomiting    Outpatient Medications Prior to Visit  Medication Sig Dispense Refill  . acetaminophen (TYLENOL) 500 MG tablet Take 2 tablets (1,000 mg total) by mouth 3 (three) times daily. 30 tablet 0  . apixaban (ELIQUIS) 2.5 MG TABS tablet Take 1 tablet (2.5 mg total) by mouth daily. 60 tablet 0  . fluticasone (FLONASE) 50 MCG/ACT nasal spray Place 1 spray into both nostrils daily as needed.    . loratadine (CLARITIN) 10 MG tablet Take 1 tablet (10 mg total) by mouth daily.    . magnesium oxide (MAG-OX) 400 MG tablet Take 400 mg by mouth 2 (two) times daily.    . Menthol-Methyl Salicylate (MUSCLE RUB) 10-15 % CREA Apply  1 application topically as needed for muscle pain.  0  . methadone (DOLOPHINE) 5 MG tablet Take 0.5 tablets (2.5 mg total) by mouth every 12 (twelve) hours as needed. For cancer pain 30 tablet 0  . metoprolol tartrate (LOPRESSOR) 25 MG tablet     . mycophenolate (CELLCEPT) 250 MG capsule Take 250 mg by mouth 2 (two) times daily.    . ondansetron (ZOFRAN) 4 MG tablet Take 1 tablet (4 mg total) by mouth 4  (four) times daily -  before meals and at bedtime. 90 tablet 0  . oxymetazoline (AFRIN) 0.05 % nasal spray Place 1 spray into both nostrils 2 (two) times daily as needed for congestion. For 2-3 days if nose bleed recurrs 30 mL 0  . pantoprazole (PROTONIX) 40 MG tablet Take 1 tablet (40 mg total) by mouth 2 (two) times daily before a meal. 60 tablet 0  . polyethylene glycol (MIRALAX / GLYCOLAX) 17 g packet Take 17 g by mouth daily. 60 each 0  . predniSONE (DELTASONE) 5 MG tablet Take 5 mg by mouth daily with breakfast.    . PROGRAF 1 MG capsule Take 1 mg by mouth 2 (two) times daily.    Marland Kitchen senna (SENOKOT) 8.6 MG TABS tablet Take 2 tablets (17.2 mg total) by mouth at bedtime. 120 tablet 0  . sodium bicarbonate 650 MG tablet Take 2 tablets (1,300 mg total) by mouth 2 (two) times daily. 120 tablet 0  . sodium chloride (OCEAN) 0.65 % SOLN nasal spray Place 1 spray into both nostrils in the morning, at noon, and at bedtime. For moisture and to prevent nose bleeds or use humidifier at home.  0  . Tedizolid Phosphate 200 MG TABS Take 200 mg by mouth daily. 30 tablet 0  . valACYclovir (VALTREX) 500 MG tablet Take by mouth 2 (two) times daily as needed.     No facility-administered medications prior to visit.     Allergies  Allergen Reactions  . Oxycodone-Acetaminophen Itching, Nausea Only, Other (See Comments) and Nausea And Vomiting    Other Reaction: HA Other Reaction: HA   . Zosyn [Piperacillin Sod-Tazobactam So] Other (See Comments)    Thrombocytopenia  . Hydromorphone Nausea And Vomiting, Rash and Other (See Comments)  . Amlodipine Other (See Comments)    Ankle swelling  . Codeine     vomiting    Social History   Tobacco Use  . Smoking status: Never Smoker  . Smokeless tobacco: Never Used  Vaping Use  . Vaping Use: Never used  Substance Use Topics  . Alcohol use: Not Currently  . Drug use: Never    Family History  Problem Relation Age of Onset  . Cerebral aneurysm Mother   .  AAA (abdominal aortic aneurysm) Brother   . Heart attack Maternal Grandmother   . Brain cancer Maternal Grandfather     Objective:   Vitals:   06/08/20 1448  BP: 115/78  Pulse: (!) 123  Temp: 98 F (36.7 C)  Weight: 96 lb (43.5 kg)  Height: 5\' 4"  (1.626 m)   Body mass index is 16.48 kg/m.  Physical Exam Vitals reviewed.  Constitutional:      General: She is not in acute distress.    Appearance: She is ill-appearing.     Comments: Thin appearing in wheelchair today. Apears uncomfortable sitting - attributes this to sacral wound.   Eyes:     General: No scleral icterus.    Pupils: Pupils are equal, round, and reactive to light.  Cardiovascular:     Rate and Rhythm: Regular rhythm. Tachycardia present.  Pulmonary:     Effort: Pulmonary effort is normal.     Breath sounds: Normal breath sounds.  Abdominal:     General: Bowel sounds are normal.     Palpations: Abdomen is soft.  Skin:    Findings: Bruising (petechial rash noted on chest and arms. Fading compared to hospitalization) present.  Neurological:     Mental Status: She is oriented to person, place, and time.     Lab Results: Lab Results  Component Value Date   WBC 6.7 06/08/2020   HGB 7.8 (L) 06/08/2020   HCT 23.8 (L) 06/08/2020   MCV 90.2 06/08/2020   PLT 177 06/08/2020    Lab Results  Component Value Date   CREATININE 2.93 (H) 06/08/2020   BUN 28 (H) 06/08/2020   NA 133 (L) 06/08/2020   K 5.5 (H) 06/08/2020   CL 99 06/08/2020   CO2 26 06/08/2020    Lab Results  Component Value Date   ALT 12 05/06/2020   AST 17 05/06/2020   ALKPHOS 213 (H) 05/06/2020   BILITOT 0.8 05/06/2020     Assessment & Plan:   Problem List Items Addressed This Visit      Unprioritized   Wound of right groin    Continue wound care as ordered with santyl and calcium alginate - drainage is described to mostly be thin and yellow in appearance. I would like for her to be seen with a wound care clinic longer term; home  health is managing for now. GYN ONC team to see her as new patient on 3/18.       Vulvar intraepithelial neoplasia III (VIN III)    GYN ONC team to see her as new patient on 3/18.       Thrombocytopenia ([place])    Had been resolving in the hospital. Will check levels today for therapeutic monitoring on tedezolid.   Lab Results  Component Value Date   PLT 139 (L) 05/17/2020         Pressure ulcer of coccygeal region, stage 2 (HCC)    Recommended foam Allevyn pad, offloading, nutritional support. Wound clinic may be helpful if this is progressing. Home Health managing.       Nausea    Improved following d/c of levaquin.       Acute osteomyelitis of left pelvic region and thigh ([place]) - Primary    MRI results obtained previously disucssed today. She has 1 more week of treatment for VRE associated osteomyelitis. Presumably polymicrobial - completed therapy with Levaquin + Metronidazole about 1.5 weeks ago now.  Will plan repeating MRI of pelvis (non-contrasted with kidney disease) to follow up on loculated abscesses and pelvic osteo. May ultimately require surgical debridement. I would presume orthopedic team would be best suited for the osteo/thigh abscesses.   FU in 24m planned pending MRI results.       Relevant Orders   MR PELVIS WO CONTRAST    Other Visit Diagnoses    Long term (current) use of antibiotics       Relevant Orders   Sedimentation rate (Completed)   C-reactive protein (Completed)   Basic metabolic panel (Completed)   CBC (Completed)       [name], MSN, NP-C [facility] for Infectious [place] Pager: [phone] Office: [phone]  06/10/20  1:54 PM

## 2020-06-08 NOTE — Assessment & Plan Note (Addendum)
MRI results obtained previously disucssed today. She has 1 more week of treatment for VRE associated osteomyelitis. Presumably polymicrobial - completed therapy with Levaquin + Metronidazole about 1.5 weeks ago now.  Will plan repeating MRI of pelvis (non-contrasted with kidney disease) to follow up on loculated abscesses and pelvic osteo. May ultimately require surgical debridement. I would presume orthopedic team would be best suited for the osteo/thigh abscesses.   FU in 7m planned pending MRI results.

## 2020-06-08 NOTE — Patient Instructions (Addendum)
For your bottom sore - see if you can get Allevyn foam dressings to help add some cushion for you.  Home Health may even be able   We will have our scheduler call you for another MRI of your pelvis to see how the fluid collections look.   Please continue with wound care as you have been doing. Call me if you notice any increased drainage or smell after you stop antibiotics.  I will call you once your MRI results are in so we can discuss next steps.    Will schedule a 2 month follow up for now pending MRI results.

## 2020-06-08 NOTE — Assessment & Plan Note (Signed)
Recommended foam Allevyn pad, offloading, nutritional support. Wound clinic may be helpful if this is progressing. Home Health managing.

## 2020-06-08 NOTE — Assessment & Plan Note (Signed)
Had been resolving in the hospital. Will check levels today for therapeutic monitoring on tedezolid.   Lab Results  Component Value Date   PLT 139 (L) 05/17/2020

## 2020-06-08 NOTE — Assessment & Plan Note (Signed)
Improved following d/c of levaquin.

## 2020-06-09 LAB — BASIC METABOLIC PANEL
BUN/Creatinine Ratio: 10 (calc) (ref 6–22)
BUN: 28 mg/dL — ABNORMAL HIGH (ref 7–25)
CO2: 26 mmol/L (ref 20–32)
Calcium: 9.5 mg/dL (ref 8.6–10.4)
Chloride: 99 mmol/L (ref 98–110)
Creat: 2.93 mg/dL — ABNORMAL HIGH (ref 0.50–1.05)
Glucose, Bld: 199 mg/dL — ABNORMAL HIGH (ref 65–99)
Potassium: 5.5 mmol/L — ABNORMAL HIGH (ref 3.5–5.3)
Sodium: 133 mmol/L — ABNORMAL LOW (ref 135–146)

## 2020-06-09 LAB — CBC
HCT: 23.8 % — ABNORMAL LOW (ref 35.0–45.0)
Hemoglobin: 7.8 g/dL — ABNORMAL LOW (ref 11.7–15.5)
MCH: 29.5 pg (ref 27.0–33.0)
MCHC: 32.8 g/dL (ref 32.0–36.0)
MCV: 90.2 fL (ref 80.0–100.0)
MPV: 8.9 fL (ref 7.5–12.5)
Platelets: 177 10*3/uL (ref 140–400)
RBC: 2.64 10*6/uL — ABNORMAL LOW (ref 3.80–5.10)
RDW: 15.2 % — ABNORMAL HIGH (ref 11.0–15.0)
WBC: 6.7 10*3/uL (ref 3.8–10.8)

## 2020-06-09 LAB — SEDIMENTATION RATE: Sed Rate: 120 mm/h — ABNORMAL HIGH (ref 0–30)

## 2020-06-09 LAB — C-REACTIVE PROTEIN: CRP: 13.3 mg/L — ABNORMAL HIGH (ref <8.0)

## 2020-06-11 DIAGNOSIS — G893 Neoplasm related pain (acute) (chronic): Secondary | ICD-10-CM

## 2020-06-11 DIAGNOSIS — D631 Anemia in chronic kidney disease: Secondary | ICD-10-CM

## 2020-06-11 DIAGNOSIS — M86151 Other acute osteomyelitis, right femur: Secondary | ICD-10-CM

## 2020-06-11 DIAGNOSIS — I120 Hypertensive chronic kidney disease with stage 5 chronic kidney disease or end stage renal disease: Secondary | ICD-10-CM

## 2020-06-11 DIAGNOSIS — S32501D Unspecified fracture of right pubis, subsequent encounter for fracture with routine healing: Secondary | ICD-10-CM | POA: Diagnosis not present

## 2020-06-11 DIAGNOSIS — D630 Anemia in neoplastic disease: Secondary | ICD-10-CM

## 2020-06-11 DIAGNOSIS — C519 Malignant neoplasm of vulva, unspecified: Secondary | ICD-10-CM | POA: Diagnosis not present

## 2020-06-11 DIAGNOSIS — D696 Thrombocytopenia, unspecified: Secondary | ICD-10-CM

## 2020-06-11 DIAGNOSIS — N186 End stage renal disease: Secondary | ICD-10-CM

## 2020-06-11 DIAGNOSIS — M8618 Other acute osteomyelitis, other site: Secondary | ICD-10-CM | POA: Diagnosis not present

## 2020-06-11 DIAGNOSIS — Z940 Kidney transplant status: Secondary | ICD-10-CM

## 2020-06-11 DIAGNOSIS — L89153 Pressure ulcer of sacral region, stage 3: Secondary | ICD-10-CM

## 2020-06-11 DIAGNOSIS — F419 Anxiety disorder, unspecified: Secondary | ICD-10-CM

## 2020-06-11 DIAGNOSIS — N766 Ulceration of vulva: Secondary | ICD-10-CM

## 2020-06-11 DIAGNOSIS — T451X5D Adverse effect of antineoplastic and immunosuppressive drugs, subsequent encounter: Secondary | ICD-10-CM

## 2020-06-14 ENCOUNTER — Telehealth: Payer: Self-pay

## 2020-06-14 ENCOUNTER — Telehealth: Payer: Self-pay | Admitting: *Deleted

## 2020-06-14 NOTE — Telephone Encounter (Signed)
Patient called, left a message stating "I have [employer] insurance. They have changed regions and my PCP is no longer in that region. I have a new PCP appt on 3/28 and that doctor will have to make a new referral to you. I will call after that appt."

## 2020-06-14 NOTE — Telephone Encounter (Signed)
TC to review MU for tomorrow's visit.  No answer, left message to return call.

## 2020-06-15 ENCOUNTER — Ambulatory Visit: Admitting: Gynecologic Oncology

## 2020-06-17 ENCOUNTER — Telehealth: Payer: Self-pay

## 2020-06-17 ENCOUNTER — Other Ambulatory Visit: Payer: Self-pay | Admitting: Infectious Diseases

## 2020-06-17 ENCOUNTER — Telehealth: Payer: Self-pay | Admitting: Infectious Diseases

## 2020-06-17 DIAGNOSIS — Z792 Long term (current) use of antibiotics: Secondary | ICD-10-CM

## 2020-06-17 DIAGNOSIS — M86152 Other acute osteomyelitis, left femur: Secondary | ICD-10-CM

## 2020-06-17 MED ORDER — TEDIZOLID PHOSPHATE 200 MG PO TABS: 200.0000 mg | ORAL_TABLET | Freq: Every day | ORAL | 0 refills | Status: DC

## 2020-06-17 MED ORDER — LEVOFLOXACIN 500 MG PO TABS: 500.0000 mg | ORAL_TABLET | ORAL | 0 refills | Status: DC

## 2020-06-17 MED ORDER — TEDIZOLID PHOSPHATE 200 MG PO TABS
200.0000 mg | ORAL_TABLET | Freq: Every day | ORAL | 0 refills | Status: DC
Start: 2020-06-17 — End: 2020-06-17

## 2020-06-17 MED ORDER — METRONIDAZOLE 500 MG PO TABS: 500.0000 mg | ORAL_TABLET | Freq: Three times a day (TID) | ORAL | 0 refills | Status: DC

## 2020-06-17 MED ORDER — METRONIDAZOLE 500 MG PO TABS
500.0000 mg | ORAL_TABLET | Freq: Three times a day (TID) | ORAL | 0 refills | Status: DC
Start: 2020-06-17 — End: 2020-06-17

## 2020-06-17 NOTE — Telephone Encounter (Signed)
Received call from [facility], they state tedizolid will be $13,000. Per Butch Penny, copay at [facility] will be $24. RN relayed this information to patient, she is requesting tedizolid, metronidazole, and levofloxacin be sent to [facility]. RN spoke to [facility] to cancel the tedizolid, metronidazole, and levofloxacin with them.   Beryle Flock, RN

## 2020-06-17 NOTE — Telephone Encounter (Signed)
I called to speak with Christina Mack - will resume flagyl TID, levaquin every other day and tedizolid daily until we can get a MRI arranged.  She is very frustrated with insurance and is going to call Christina Mack with an update regarding the same.   I am hopeful we can get an MRI asap to help with further guidance (surgery referral possibly) regarding infection.    [name], MSN, NP-C [facility] for Infectious Disease [email or web address] Pager: [phone] Office: [phone] [place]: [phone]

## 2020-06-17 NOTE — Telephone Encounter (Signed)
We have been working with patient's insurance and unable to get imaging approved despite this being a continuity of care issue. Apparently we need to wait for her to establish and see her new PCP THEN they can approve the study.   Will see if we can extend out the patient's tedizolid x 4 weeks until we can repeat her imaging to follow for abscess resolution. I feel like we should also probably continue the levaquin + flagyl also since we are not 334% certain what is the primary offending bacteria for her problem. Will investigate cost and then call [place] back with recommendations.   ER precautions for more urgent scan and evaluate has been relayed to her previously

## 2020-06-17 NOTE — Addendum Note (Signed)
Addended by: [place] Callas on: 06/17/2020 03:42 PM   Modules accepted: Orders

## 2020-06-18 ENCOUNTER — Encounter: Admitting: Physical Medicine and Rehabilitation

## 2020-06-18 ENCOUNTER — Telehealth: Payer: Self-pay | Admitting: *Deleted

## 2020-06-18 NOTE — Telephone Encounter (Signed)
Christina Mack PT called for POC 2wk3 beginning 06/20/20.  Approval given.

## 2020-06-28 ENCOUNTER — Telehealth: Payer: Self-pay | Admitting: *Deleted

## 2020-06-28 NOTE — Telephone Encounter (Signed)
Erlene Quan OT called for an extension on OT 1wk3.  Approval given but reminded Erlene Quan to make sure Ms Adney is aware she must keep her 08/06/20 appt (hospital follow up). Her 06/18/20 appt was canceled. He said she is planning to be at the appt. Her insurance changed.

## 2020-07-08 ENCOUNTER — Telehealth: Payer: Self-pay | Admitting: *Deleted

## 2020-07-08 NOTE — Telephone Encounter (Signed)
Frankie PT called to get ext to complete the cert period 2wk1 and then re certify for [id number]. Approval given.

## 2020-07-13 ENCOUNTER — Other Ambulatory Visit: Payer: Self-pay | Admitting: Family Medicine

## 2020-07-13 DIAGNOSIS — Z1231 Encounter for screening mammogram for malignant neoplasm of breast: Secondary | ICD-10-CM

## 2020-07-15 ENCOUNTER — Telehealth: Payer: Self-pay

## 2020-07-15 NOTE — Telephone Encounter (Signed)
Verbal okay given for in-home nursing care. Care to be once a week for 9 weeks. Patient must keep appointment.  ([place] ph [phone])

## 2020-08-06 ENCOUNTER — Encounter: Admitting: Physical Medicine and Rehabilitation

## 2020-08-09 ENCOUNTER — Telehealth (INDEPENDENT_AMBULATORY_CARE_PROVIDER_SITE_OTHER): Admitting: Infectious Diseases

## 2020-08-09 ENCOUNTER — Encounter: Payer: Self-pay | Admitting: Infectious Diseases

## 2020-08-09 ENCOUNTER — Other Ambulatory Visit: Payer: Self-pay

## 2020-08-09 DIAGNOSIS — D071 Carcinoma in situ of vulva: Secondary | ICD-10-CM | POA: Diagnosis not present

## 2020-08-09 DIAGNOSIS — M86152 Other acute osteomyelitis, left femur: Secondary | ICD-10-CM

## 2020-08-09 NOTE — Assessment & Plan Note (Signed)
Had to cancel appt with GYN ONC at [facility] in March. Needs to arrange follow up now that her insurance and PCP have been sorted out. PET scan findings reviewed.

## 2020-08-09 NOTE — Assessment & Plan Note (Signed)
She has had > 12 weeks treatment given that we have had to extend out d/t delay in MRI. I would still like for her to get an MRI for follow up on fluid collection for comparison sake. 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 Clinically she sounds to be quite improved. Will have her stop antibiotics now and follow.  Should she fail prolonged treatment would recommend referral to Ortho specialist to review MRIs to see if any surgical treatment to be considered.

## 2020-08-09 NOTE — Progress Notes (Signed)
Patient: Christina Mack  DOB: 1966-03-20 MRN: [id number] PCP: Bonnita Nasuti, MD   VIRTUAL CARE ENCOUNTER  I connected with Christina Mack on 08/09/20 at  3:45 PM EDT by VIDEO and verified that I am speaking with the correct person using two identifiers.  Video did end halfway through the visit and continued/concluced over the telephone.    I discussed the limitations, risks, security and privacy concerns of performing an evaluation and management service by telephone and the availability of in person appointments. I also discussed with the patient that there may be a patient responsible charge related to this service. The patient expressed understanding and agreed to proceed.  Patient Location: [facility]   Other Participants: [facility]   Provider Location: [facility] Office    Subjective:  Christina Mack is a 55 y.o. 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 She is on chronic immunosuppression for kidney transplant.  She has over the last 2 months been on tedezolid, flagyl and levaquin for treatment. She completed the antibiotics recently (after 2 week hold to get her PET scan done).  Reports that the drainage is a lot less in volume and thin in consistency without odor. Not exactly sure how much healing has taken place.  Her hip/pelvic pain is better than last OV with me. She no longer needs her walker and feels her strength is returning.  Her appetite has been good the last several months and has regained some weight (reported to be 102 lb now up from 96 lbs in March).   Has had a delay in obtaining her MRI follow up d/t insurance and needing to clear through new PCP. Had a PET scan with oncology through [facility] on 07/27/2020.     Review of Systems  Constitutional: Positive for malaise/fatigue. Negative for chills and fever.  HENT: Negative for tinnitus.   Eyes: Negative for blurred vision and photophobia.  Respiratory:  Negative for cough and sputum production.   Cardiovascular: Negative for chest pain.  Gastrointestinal: Negative for abdominal pain, diarrhea, nausea and vomiting.  Genitourinary: Negative for dysuria.  Musculoskeletal: Positive for joint pain.  Skin: Negative for rash.  Neurological: Negative for weakness (improved) and headaches.     Past Medical History:  Diagnosis Date  . Complication of anesthesia    nausea and vomiting    Outpatient Medications Prior to Visit  Medication Sig Dispense Refill  . acetaminophen (TYLENOL) 500 MG tablet Take 2 tablets (1,000 mg total) by mouth 3 (three) times daily. 30 tablet 0  . apixaban (ELIQUIS) 2.5 MG TABS tablet TAKE 1 TABLET (2.5 MG TOTAL) BY MOUTH DAILY. 60 tablet 0  . fluticasone (FLONASE) 50 MCG/ACT nasal spray Place 1 spray into both nostrils daily as needed.    Marland Kitchen levofloxacin (LEVAQUIN) 500 MG tablet TAKE 1 TABLET (500 MG TOTAL) BY MOUTH EVERY OTHER DAY FOR 12 DAYS. 6 tablet 0  . levofloxacin (LEVAQUIN) 500 MG tablet TAKE 1 TABLET BY MOUTH EVERY OTHER DAY 15 tablet 0  . loratadine (CLARITIN) 10 MG tablet Take 1 tablet (10 mg total) by mouth daily.    . magnesium oxide (MAG-OX) 400 MG tablet Take 400 mg by mouth 2 (two) times daily.    . Menthol-Methyl Salicylate (MUSCLE RUB) 10-15 % CREA Apply 1 application topically as needed for muscle pain.  0  . methadone (DOLOPHINE) 5 MG tablet TAKE 1/2 TABLET (2.5 MG TOTAL) BY MOUTH EVERY TWELVE  HOURS AS NEEDED FOR CANCER PAIN 30 tablet 0  . metoprolol tartrate (LOPRESSOR) 25 MG tablet     . metroNIDAZOLE (FLAGYL) 500 MG tablet TAKE 1 TABLET (500 MG TOTAL) BY MOUTH EVERY 8 (EIGHT) HOURS FOR 12 DAYS. 42 tablet 0  . metroNIDAZOLE (FLAGYL) 500 MG tablet TAKE 1 TABLET BY MOUTH 3 TIMES DAILY 90 tablet 0  . mycophenolate (CELLCEPT) 250 MG capsule Take 250 mg by mouth 2 (two) times daily.    . ondansetron (ZOFRAN) 4 MG tablet TAKE 1 TABLET (4 MG TOTAL) BY MOUTH 4 (FOUR) TIMES DAILY - BEFORE MEALS AND AT  BEDTIME. 90 tablet 0  . oxymetazoline (AFRIN) 0.05 % nasal spray Place 1 spray into both nostrils 2 (two) times daily as needed for congestion. For 2-3 days if nose bleed recurrs 30 mL 0  . pantoprazole (PROTONIX) 40 MG tablet TAKE 1 TABLET (40 MG TOTAL) BY MOUTH 2 (TWO) TIMES DAILY BEFORE A MEAL. 60 tablet 0  . polyethylene glycol (MIRALAX / GLYCOLAX) 17 g packet Take 17 g by mouth daily. 60 each 0  . polyethylene glycol powder (GLYCOLAX/MIRALAX) 17 GM/SCOOP powder TAKE 1 SCOOP (17 G) BY MOUTH DAILY. 510 g 0  . predniSONE (DELTASONE) 5 MG tablet Take 5 mg by mouth daily with breakfast.    . PROGRAF 1 MG capsule Take 1 mg by mouth 2 (two) times daily.    Marland Kitchen senna (SENOKOT) 8.6 MG TABS tablet TAKE 2 TABLETS (17.2 MG TOTAL) BY MOUTH AT BEDTIME. 120 tablet 0  . sodium bicarbonate 650 MG tablet TAKE 2 TABLETS (1,300 MG TOTAL) BY MOUTH 2 (TWO) TIMES DAILY. 120 tablet 0  . sodium chloride (OCEAN) 0.65 % SOLN nasal spray Place 1 spray into both nostrils in the morning, at noon, and at bedtime. For moisture and to prevent nose bleeds or use humidifier at home.  0  . Tedizolid Phosphate 200 MG TABS TAKE 1 TABLET (200 MG) BY MOUTH DAILY. 30 tablet 0  . Tedizolid Phosphate 200 MG TABS TAKE 1 TABLET BY MOUTH DAILY 30 tablet 0  . valACYclovir (VALTREX) 500 MG tablet Take by mouth 2 (two) times daily as needed.     No facility-administered medications prior to visit.     Allergies  Allergen Reactions  . Oxycodone-Acetaminophen Itching, Nausea Only, Other (See Comments) and Nausea And Vomiting    Other Reaction: HA Other Reaction: HA   . Zosyn [Piperacillin Sod-Tazobactam So] Other (See Comments)    Thrombocytopenia  . Hydromorphone Nausea And Vomiting, Rash and Other (See Comments)  . Amlodipine Other (See Comments)    Ankle swelling  . Codeine     vomiting    Social History   Tobacco Use  . Smoking status: Never Smoker  . Smokeless tobacco: Never Used  Vaping Use  . Vaping Use: Never used   Substance Use Topics  . Alcohol use: Not Currently  . Drug use: Never    Family History  Problem Relation Age of Onset  . Cerebral aneurysm Mother   . AAA (abdominal aortic aneurysm) Brother   . Heart attack Maternal Grandmother   . Brain cancer Maternal Grandfather     Objective:   There were no vitals filed for this visit. There is no height or weight on file to calculate BMI.  Physical Exam Vitals reviewed.  Constitutional:      Appearance: Normal appearance. She is not ill-appearing.     Comments: Thin appearing female in no distress. Pleasant and in good spirits  today.   HENT:     Mouth/Throat:     Mouth: Mucous membranes are moist.     Pharynx: Oropharynx is clear.  Eyes:     General: No scleral icterus. Pulmonary:     Effort: Pulmonary effort is normal.  Skin:    Coloration: Skin is not jaundiced.     Findings: No rash (nothing noted to exposed skin on camera).  Neurological:     Mental Status: She is oriented to person, place, and time.  Psychiatric:        Mood and Affect: Mood normal.        Thought Content: Thought content normal.     Lab Results: Lab Results  Component Value Date   WBC 6.7 06/08/2020   HGB 7.8 (L) 06/08/2020   HCT 23.8 (L) 06/08/2020   MCV 90.2 06/08/2020   PLT 177 06/08/2020    Lab Results  Component Value Date   CREATININE 2.93 (H) 06/08/2020   BUN 28 (H) 06/08/2020   NA 133 (L) 06/08/2020   K 5.5 (H) 06/08/2020   CL 99 06/08/2020   CO2 26 06/08/2020    Lab Results  Component Value Date   ALT 12 05/06/2020   AST 17 05/06/2020   ALKPHOS 213 (H) 05/06/2020   BILITOT 0.8 05/06/2020     Assessment & Plan:   Problem List Items Addressed This Visit      Unprioritized   Vulvar intraepithelial neoplasia III (VIN III)    Had to cancel appt with GYN ONC at [facility] in March. Needs to arrange follow up now that her insurance and PCP have been sorted out. PET scan findings reviewed.       Acute osteomyelitis of left pelvic  region and thigh ([place])    She has had > 12 weeks treatment given that we have had to extend out d/t delay in MRI. I would still like for her to get an MRI for follow up on fluid collection for comparison sake. 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 Clinically she sounds to be quite improved. Will have her stop antibiotics now and follow.  Should she fail prolonged treatment would recommend referral to Ortho specialist to review MRIs to see if any surgical treatment to be considered.           [name], MSN, NP-C [facility] for Infectious [place] Pager: [phone] Office: [phone]  08/09/20  10:16 PM

## 2020-09-15 ENCOUNTER — Encounter: Payer: Self-pay | Admitting: Physical Medicine and Rehabilitation

## 2020-09-15 ENCOUNTER — Encounter: Attending: Physical Medicine and Rehabilitation | Admitting: Physical Medicine and Rehabilitation

## 2020-09-15 ENCOUNTER — Other Ambulatory Visit: Payer: Self-pay

## 2020-09-15 VITALS — BP 107/80 | HR 116 | Temp 98.9°F | Ht 64.0 in | Wt 117.6 lb

## 2020-09-15 DIAGNOSIS — C519 Malignant neoplasm of vulva, unspecified: Secondary | ICD-10-CM

## 2020-09-15 DIAGNOSIS — R29898 Other symptoms and signs involving the musculoskeletal system: Secondary | ICD-10-CM | POA: Diagnosis not present

## 2020-09-15 DIAGNOSIS — R5381 Other malaise: Secondary | ICD-10-CM | POA: Insufficient documentation

## 2020-09-15 NOTE — Patient Instructions (Signed)
Pt is a 55 yr old female with vulvar cancer and osteomyelitis- s/p admission for debility-  Here for hospital follow up. Has severe debility- slowing getting to point to walk again with quad cane- Needs additional PT to get back to baseline- which was walking with no assistive device  Suggest doing things to prevent long term dementia/memory issues. Word puzzles, soduku, etc.Went over lacunar infarct- usually silent, but can add up if has more-  2. Is already on Eliquis- so will not add Aspirin-  3. Ordered outpt PT- schedule AFTER finishes H/H nursing- so doesn't overlap.  4.  Continue to do squats, and hip exercises- is PTA, so knows how ot work on this. Ne do something every day.  Can get into salt water private pool- once daughter's is fixed.  5. F/U in 2 months

## 2020-09-15 NOTE — Progress Notes (Signed)
Subjective:    Patient ID: Christina Mack, female    DOB: Oct 15, 1965, 55 y.o.   MRN: [id number]  HPI  Pt is a 55 yr old female with vulvar cancer and osteomyelitis- s/p admission for debility-  Here for hospital follow up.   Also found had tiny chronic thalamic lacunar infarct-   Rescheduled appointment from previous reschedule  Already on Eliquis-  so doesn't want to add ASA if possible.  If cuts self, bleeds like crazy already.  On Eliquis due to DVT in groin 9/21.   Pain- a little uncomfortable if sits a long time-   Is up to 117 lbs-  Eats everything- appetite is back.    Still getting nursing- for vulvar wound-  Will be discharged in 4 week - done with PT and OT.   Up a lot in house- several hours/day.  Can walk ~ 15 minutes before having to stop- walking outside the house. Using small quad cane  Pain Inventory Average Pain 2 Pain Right Now 0 My pain is dull and aching  In the last 24 hours, has pain interfered with the following? General activity 0 Relation with others 0 Enjoyment of life 0 What TIME of day is your pain at its worst? evening Sleep (in general) Good  Pain is worse with: sitting Pain improves with:  na Relief from Meds:  no pain  use a cane how many minutes can you walk? 15 ability to climb steps?  yes do you drive?  yes  not employed: date last employed medical leave  weakness  Any changes since last visit?  no  Any changes since last visit?  no    Family History  Problem Relation Age of Onset   Cerebral aneurysm Mother    AAA (abdominal aortic aneurysm) Brother    Heart attack Maternal Grandmother    Brain cancer Maternal Grandfather    Social History   Socioeconomic History   Marital status: Married    Spouse name: Not on file   Number of children: Not on file   Years of education: Not on file   Highest education level: Not on file  Occupational History   Not on file  Tobacco Use   Smoking status: Never    Smokeless tobacco: Never  Vaping Use   Vaping Use: Never used  Substance and Sexual Activity   Alcohol use: Not Currently   Drug use: Never   Sexual activity: Not on file  Other Topics Concern   Not on file  Social History Narrative   Not on file   Social Determinants of Health   Financial Resource Strain: Not on file  Food Insecurity: Not on file  Transportation Needs: Not on file  Physical Activity: Not on file  Stress: Not on file  Social Connections: Not on file   Past Surgical History:  Procedure Laterality Date   BIOPSY  04/24/2020   Procedure: BIOPSY;  Surgeon: Doran Stabler, MD;  Location: [place];  Service: Gastroenterology;;   ESOPHAGOGASTRODUODENOSCOPY (EGD) WITH PROPOFOL N/A 04/24/2020   Procedure: ESOPHAGOGASTRODUODENOSCOPY (EGD) WITH PROPOFOL;  Surgeon: Doran Stabler, MD;  Location: [place];  Service: Gastroenterology;  Laterality: N/A;   IR US GUIDE BX ASP/DRAIN  04/30/2020   Past Medical History:  Diagnosis Date   Complication of anesthesia    nausea and vomiting   There were no vitals taken for this visit.  Opioid Risk Score:   Fall Risk Score:  `1  Depression  screen PHQ 2/9  Depression screen PHQ 2/9 06/08/2020  Decreased Interest 0  Down, Depressed, Hopeless 0  PHQ - 2 Score 0     Review of Systems  Constitutional: Negative.   HENT: Negative.    Eyes: Negative.   Respiratory: Negative.    Cardiovascular: Negative.   Gastrointestinal: Negative.   Endocrine: Negative.   Genitourinary: Negative.   Musculoskeletal:  Positive for gait problem.  Skin: Negative.   Allergic/Immunologic: Negative.   Hematological:  Bruises/bleeds easily.       Eliquis  Psychiatric/Behavioral: Negative.    All other systems reviewed and are negative.     Objective:   Physical Exam Awake, alert, appropriate, has gained weight since was in hospital- walking with small quad cane, NAD Ue's-  Deltoids 4-/5, biceps 4+/5, triceps 5-/5, grip 4+/5,  and finger abd 4/5 B/L LE's - HF 3-/5, KE/KF 4+/5, DF 4/5 and PF 5-/5 B/L- can stand on heels and toes for a few seconds as well.  Skin- wounds are dressed- currently- cannot assess Neuro: Intact to light touch in all 4 extremities        Assessment & Plan:   Pt is a 55 yr old female with vulvar cancer and osteomyelitis- s/p admission for debility-  Here for hospital follow up. Has severe debility- slowing getting to point to walk again with quad cane- Needs additional PT to get back to baseline- which was walking with no assistive device  Suggest doing things to prevent long term dementia/memory issues. Word puzzles, soduku, etc.Went over lacunar infarct- usually silent, but can add up if has more-  2. Is already on Eliquis- so will not add Aspirin-  3. Ordered outpt PT- schedule AFTER finishes H/H nursing- so doesn't overlap.  4.  Continue to do squats, and hip exercises- is PTA, so knows how ot work on this. Ne do something every day.  Can get into salt water private pool- once daughter's is fixed.  5. F/U in 2 months

## 2020-10-13 DIAGNOSIS — D630 Anemia in neoplastic disease: Secondary | ICD-10-CM

## 2020-10-13 DIAGNOSIS — T451X5D Adverse effect of antineoplastic and immunosuppressive drugs, subsequent encounter: Secondary | ICD-10-CM | POA: Diagnosis not present

## 2020-10-13 DIAGNOSIS — C519 Malignant neoplasm of vulva, unspecified: Secondary | ICD-10-CM

## 2020-10-13 DIAGNOSIS — D696 Thrombocytopenia, unspecified: Secondary | ICD-10-CM

## 2020-10-13 DIAGNOSIS — F419 Anxiety disorder, unspecified: Secondary | ICD-10-CM

## 2020-10-13 DIAGNOSIS — M8618 Other acute osteomyelitis, other site: Secondary | ICD-10-CM | POA: Diagnosis not present

## 2020-10-13 DIAGNOSIS — I120 Hypertensive chronic kidney disease with stage 5 chronic kidney disease or end stage renal disease: Secondary | ICD-10-CM

## 2020-10-13 DIAGNOSIS — D631 Anemia in chronic kidney disease: Secondary | ICD-10-CM

## 2020-10-13 DIAGNOSIS — R64 Cachexia: Secondary | ICD-10-CM

## 2020-10-13 DIAGNOSIS — E43 Unspecified severe protein-calorie malnutrition: Secondary | ICD-10-CM

## 2020-10-13 DIAGNOSIS — N179 Acute kidney failure, unspecified: Secondary | ICD-10-CM

## 2020-10-13 DIAGNOSIS — N766 Ulceration of vulva: Secondary | ICD-10-CM

## 2020-10-13 DIAGNOSIS — N186 End stage renal disease: Secondary | ICD-10-CM

## 2020-10-13 DIAGNOSIS — K259 Gastric ulcer, unspecified as acute or chronic, without hemorrhage or perforation: Secondary | ICD-10-CM

## 2020-10-13 DIAGNOSIS — R634 Abnormal weight loss: Secondary | ICD-10-CM

## 2020-10-13 DIAGNOSIS — Z681 Body mass index (BMI) 19 or less, adult: Secondary | ICD-10-CM

## 2020-10-13 DIAGNOSIS — G893 Neoplasm related pain (acute) (chronic): Secondary | ICD-10-CM

## 2020-10-13 DIAGNOSIS — R627 Adult failure to thrive: Secondary | ICD-10-CM

## 2020-10-13 DIAGNOSIS — S32501D Unspecified fracture of right pubis, subsequent encounter for fracture with routine healing: Secondary | ICD-10-CM | POA: Diagnosis not present

## 2020-10-13 DIAGNOSIS — M6284 Sarcopenia: Secondary | ICD-10-CM

## 2020-10-18 ENCOUNTER — Encounter: Payer: Self-pay | Admitting: Physical Therapy

## 2020-10-18 ENCOUNTER — Other Ambulatory Visit: Payer: Self-pay

## 2020-10-18 ENCOUNTER — Ambulatory Visit: Attending: Physical Medicine and Rehabilitation | Admitting: Physical Therapy

## 2020-10-18 DIAGNOSIS — R2689 Other abnormalities of gait and mobility: Secondary | ICD-10-CM | POA: Diagnosis present

## 2020-10-18 DIAGNOSIS — M6281 Muscle weakness (generalized): Secondary | ICD-10-CM | POA: Diagnosis present

## 2020-10-18 NOTE — Therapy (Signed)
[place], [place], [address] Phone: [phone]   Fax:  [phone]  Physical Therapy Evaluation  Patient Details  Name: Christina Mack MRN: [id number] Date of Birth: October 25, 1965 Referring Provider (PT): Courtney Heys, MD   Encounter Date: 10/18/2020   PT End of Session - 10/18/20 0951     Visit Number 1    Number of Visits 6    Date for PT Re-Evaluation 11/29/20    Authorization Type TRIC[employer]    PT Start Time 1000    PT Stop Time 1045    PT Time Calculation (min) 45 min    Activity Tolerance Patient tolerated treatment well    Behavior During Therapy [facility] for tasks assessed/performed             Past Medical History:  Diagnosis Date   Complication of anesthesia    nausea and vomiting    Past Surgical History:  Procedure Laterality Date   BIOPSY  04/24/2020   Procedure: BIOPSY;  Surgeon: Doran Stabler, MD;  Location: [place];  Service: Gastroenterology;;   ESOPHAGOGASTRODUODENOSCOPY (EGD) WITH PROPOFOL N/A 04/24/2020   Procedure: ESOPHAGOGASTRODUODENOSCOPY (EGD) WITH PROPOFOL;  Surgeon: Doran Stabler, MD;  Location: [place];  Service: Gastroenterology;  Laterality: N/A;   IR US GUIDE BX ASP/DRAIN  04/30/2020    There were no vitals filed for this visit.    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 She did have HHPT that ended about a month ago. Patient states she used to require help for pretty much everything but is now able to go up/down stairs and get around the house independently. She uses the cane when outside the house but does not use it while walking inside the home. Patient reports that most of her difficulty involves balance and strength deficits.    Pertinent History Left knee scope/arthritis    Limitations Lifting;Standing;Walking;House  hold activities    How long can you walk comfortably? 5-8 minutes    Patient Stated Goals Walk without cane, squat to get stuff off floor or out of low cabinet, improve getting in/out of car    Currently in Pain? No/denies                [facility] PT Assessment - 10/18/20 0001       Assessment   Medical Diagnosis Debility    Referring Provider (PT) Courtney Heys, MD    Onset Date/Surgical Date 04/03/20    Hand Dominance Right    Next MD Visit 11/22/2020    Prior Therapy HHPT      Precautions   Precautions Fall    Precaution Comments History of falls, using AD for community ambulation      Restrictions   Weight Bearing Restrictions No      Balance Screen   Has the patient fallen in the past 6 months Yes    How many times? 1 fall in Mar 2022 that led to small skin tear    Has the patient had a decrease in activity level because of a fear of falling?  No    Is the patient reluctant to leave their home because of a fear of falling?  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      Cognition   Overall Cognitive Status Within Functional Limits for tasks assessed      Observation/Other Assessments   Observations Patient appears in no apparent distress    Focus on Therapeutic Outcomes (FOTO)  54% functional status      Coordination   Gross Motor Movements are Fluid and Coordinated Yes      ROM / Strength   AROM / PROM / Strength Strength      Strength    Strength Assessment Site Hip;Knee;Ankle    Right/Left Hip Right;Left    Right Hip Flexion 4-/5    Right Hip Extension 3-/5    Right Hip ABduction 3/5    Left Hip Flexion 4-/5    Left Hip Extension 3-/5    Left Hip ABduction 3/5    Right/Left Knee Right;Left    Right Knee Flexion 4/5    Right Knee Extension 4/5    Left Knee Flexion 4/5    Left Knee Extension 4/5    Right/Left Ankle Right;Left    Right Ankle Dorsiflexion 4/5    Right Ankle Plantar Flexion 4/5    Left Ankle Dorsiflexion 4/5    Left Ankle Plantar Flexion 4/5      Flexibility   Soft Tissue Assessment /Muscle Length yes    Quadriceps hip flexor tightness noted      Palpation   Palpation comment Non TTP      Transfers   Transfers Independent with all Transfers      Ambulation/Gait   Ambulation/Gait Yes    Ambulation/Gait Assistance 6: Modified independent (Device/Increase time)    Assistive device Straight cane   used on left   Gait Comments Trendelenburg on right      6 minute walk test results    Aerobic Endurance Distance Walked 705    Endurance additional comments used cane for support      Standardized Balance Assessment   Standardized Balance Assessment Timed Up and Go Test;Five Times Sit to Stand    Five times sit to stand comments  16 seconds, hands on thighs for support      Timed Up and Go Test   TUG Normal TUG    Normal TUG (seconds) 11   patient did not use AD                       Objective measurements completed on examination: See above findings.       [place] Adult PT Treatment/Exercise - 10/18/20 0001       Exercises   Exercises Knee/Hip      Knee/Hip Exercises: Stretches   Hip Flexor Stretch 2 reps;60 seconds    Hip Flexor Stretch Limitations modified thomas      Knee/Hip Exercises: Standing   Other Standing Knee Exercises Partial lunge with counter support 2 x 8      Knee/Hip Exercises: Seated   Long Arc Quad 2 sets;10 reps  Long Arc Quad Limitations  yellow    Hamstring Curl 2 sets;10 reps    Hamstring Limitations yellow    Sit to [employer] 10 reps      Knee/Hip Exercises: Supine   Bridges 2 sets;10 reps   3 sec hold     Knee/Hip Exercises: Sidelying   Hip ABduction 2 sets;10 reps                    PT Education - 10/18/20 0951     Education Details Exam findings, POC, HEP    Person(s) Educated Patient    Methods Explanation;Demonstration;Verbal cues;Handout    Comprehension Verbalized understanding;Returned demonstration;Verbal cues required;Need further instruction              PT Short Term Goals - 10/18/20 0952       PT SHORT TERM GOAL #1   Title Patient will be I with initial HEP to progress with PT    Time 3    Period Weeks    Status New    Target Date 11/08/20      PT SHORT TERM GOAL #2   Title PT will review FOTO with patient by 3rd visit to understand expected progress    Time 3    Period Weeks    Status New    Target Date 11/08/20               PT Long Term Goals - 10/18/20 0953       PT LONG TERM GOAL #1   Title Patient will be I with final HEP to maintain progress from PT    Time 6    Period Weeks    Status New    Target Date 11/29/20      PT LONG TERM GOAL #2   Title Patient will report >/= 62% functional status on FOTO to indicate improved functional ability    Time 6    Period Weeks    Status New    Target Date 11/29/20      PT LONG TERM GOAL #3   Title Patient will demonsrate >/= 4-/5 MMT hip strength and >/= 4+/5 MMT knee strength to improve stair negotiation    Time 6    Period Weeks    Status New    Target Date 11/29/20      PT LONG TERM GOAL #4   Title Patient will demonstrate improved 5xSTS to </= 12 sec to reduce fall risk    Time 6    Period Weeks    Status New    Target Date 11/29/20      PT LONG TERM GOAL #5   Title Patient will demonstrate 6MWT >/= 1000 ft without AD to improve community access    Time 6    Period Weeks    Status New    Target  Date 11/29/20                    Plan - 10/18/20 1306     Clinical Impression Statement Patient presents to PT following extended hospitalization and previous cancer treatments that have led to progressive weakness and balance deficits. Currently patient does exhibit generalized BLE strength deficit and exhibits increased risk of falls based on 5xSTS and TUG, with impaired walking ability and endurance with 6MWT. Patient uses cane mainly due to right hip weakness and support in community. Patient is limited with general mobility and functional tasks such as stairs and squatting, and she  would benefit from continued skilled PT to progress her mobility and strength in order to improve walking ability and maximize her functional mobility.    Personal Factors and Comorbidities Fitness;Past/Current Experience;Time since onset of injury/illness/exacerbation;Comorbidity 3+    Comorbidities History of cancer with radiation and chemo treatment, CKD with kidney transplant, immunocompromised, debility secondary to hospitalizations    Examination-Activity Limitations Locomotion Level;Squat;Stairs;Stand;Lift    Examination-Participation Restrictions Meal Prep;Cleaning;Occupation;Community Activity;Shop;Laundry;Yard Work    Stability/Clinical Decision Making Stable/Uncomplicated    Designer, jewellery Low    Rehab Potential Good    PT Frequency 1x / week    PT Duration 6 weeks    PT Treatment/Interventions ADLs/Self Care Home Management;Aquatic Therapy;Neuromuscular re-education;Balance training;Therapeutic exercise;Therapeutic activities;Functional mobility training;Stair training;Gait training;Patient/family education;Manual techniques;Passive range of motion;Energy conservation    PT Next Visit Plan Review HEP and progress PRN, review FOTO, progress generalized LE strength, step-ups/squats, initiate balance training    PT Home Exercise Plan [email or web address]    Consulted and Agree with Plan of Care  Patient             Patient will benefit from skilled therapeutic intervention in order to improve the following deficits and impairments:  Abnormal gait, Difficulty walking, Decreased strength, Decreased activity tolerance, Decreased endurance, Decreased balance, Impaired flexibility  Visit Diagnosis: Muscle weakness (generalized)  Other abnormalities of gait and mobility     Problem List Patient Active Problem List   Diagnosis Date Noted   Leg weakness, bilateral 09/15/2020   Screening for malignant neoplasm of skin 05/31/2020   Exposure to confirmed case of COVID-19 05/18/2020   Immunocompromised state due to drug therapy ([place]) 05/18/2020   Protein-calorie malnutrition, severe 05/14/2020   Chronic kidney disease (CKD), stage IV (severe) (HCC)    Chronic pain syndrome    Cancer associated pain    Pressure injury of skin 05/06/2020   Debility    Acute on chronic anemia    Thrombocytopenia ([place]) 04/26/2020   Esophageal candidiasis ([place]) 04/26/2020   Normocytic anemia 04/26/2020   CKD (chronic kidney disease) stage 4, GFR 15-29 ml/min (HCC) 04/26/2020   Wound of right groin 04/26/2020   Gastric ulcer without hemorrhage or perforation 04/26/2020   Malnutrition of moderate degree 04/26/2020   Acute osteomyelitis of left pelvic region and thigh ([place])    Pressure ulcer of coccygeal region, stage 2 ([place]) 04/21/2020   Weakness 04/20/2020   Acute deep vein thrombosis (DVT) of femoral vein ([place]) 12/05/2019   Iron deficiency anemia 11/10/2019   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([place]) 11/06/2019   Sprain of anterior talofibular ligament of right ankle 11/22/2016   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 [place], [place], [address] Phone: [phone]   Fax:  [phone]  Name: Christina Mack MRN: [id number] Date of Birth: [birth year]

## 2020-10-18 NOTE — Patient Instructions (Signed)
Access Code: [email or web address] URL: [email or web address] Date: 10/18/2020 Prepared by: Hilda Blades  Exercises Modified Marcello Moores Stretch - 1-2 x daily - 7 x weekly - 2 reps - 60 hold Bridge - 1 x daily - 5 x weekly - 3 sets - 10 reps - 3 hold Sidelying Hip Abduction - 1 x daily - 5 x weekly - 3 sets - 10 reps Seated Knee Extension with Resistance - 1 x daily - 5 x weekly - 3 sets - 10 reps Seated Hamstring Curls with Resistance - 1 x daily - 5 x weekly - 3 sets - 10 reps Sit to Stand - 1 x daily - 5 x weekly - 3 sets - 10 reps Partial Lunge with Chair - 1 x daily - 5 x weekly - 2 sets - 8 reps

## 2020-10-19 ENCOUNTER — Telehealth: Payer: Self-pay

## 2020-10-19 NOTE — Telephone Encounter (Signed)
Pt called to schedule a new patient appointment with Dr. Berline Lopes. Pt was a hospital consult in 2-22 but appointments cancelled with pt being in a rehab facility.  She was offered a7-26;7;26 and declined due to conflicting appointments with rehab appointments. She is scheduled to see Dr. Berline Lopes on 10-29-20 at  1115.

## 2020-10-25 ENCOUNTER — Ambulatory Visit: Admitting: Physical Therapy

## 2020-10-25 ENCOUNTER — Encounter: Payer: Self-pay | Admitting: Physical Therapy

## 2020-10-25 ENCOUNTER — Other Ambulatory Visit: Payer: Self-pay

## 2020-10-25 DIAGNOSIS — M6281 Muscle weakness (generalized): Secondary | ICD-10-CM | POA: Diagnosis not present

## 2020-10-25 DIAGNOSIS — R2689 Other abnormalities of gait and mobility: Secondary | ICD-10-CM

## 2020-10-25 NOTE — Patient Instructions (Signed)
Access Code: [email or web address] URL: [email or web address] Date: 10/25/2020 Prepared by: Hilda Blades  Exercises Modified Marcello Moores Stretch - 1-2 x daily - 7 x weekly - 2 reps - 60 hold Bridge - 1 x daily - 5 x weekly - 3 sets - 10 reps - 3 hold Active Straight Leg Raise with Quad Set - 1 x daily - 7 x weekly - 3 sets - 10 reps Sidelying Hip Abduction - 1 x daily - 5 x weekly - 3 sets - 10 reps Seated Knee Extension with Resistance - 1 x daily - 5 x weekly - 3 sets - 10 reps Seated Hamstring Curls with Resistance - 1 x daily - 5 x weekly - 3 sets - 10 reps Sit to Stand - 1 x daily - 5 x weekly - 3 sets - 10 reps Partial Lunge with Chair - 1 x daily - 5 x weekly - 2 sets - 8 reps Standing Hip Extension on Swiss Ball - 1 x daily - 7 x weekly - 2 sets - 10 reps Standing Tandem Balance with Counter Support - 1 x daily - 7 x weekly - 3 sets - 30 hold

## 2020-10-25 NOTE — Therapy (Signed)
Christina Mack, [place], [address] Phone: [phone]   Fax:  [phone]  Physical Therapy Treatment  Patient Details  Name: Christina Mack MRN: [id number] Date of Birth: 04-Aug-1965 Referring Provider (PT): Courtney Heys, MD   Encounter Date: 10/25/2020   PT End of Session - 10/25/20 1010     Visit Number 2    Number of Visits 6    Date for PT Re-Evaluation 11/29/20    Authorization Type TRICARE    PT Start Time 1000    PT Stop Time 1045    PT Time Calculation (min) 45 min    Activity Tolerance Patient tolerated treatment well    Behavior During Therapy [facility] for tasks assessed/performed             Past Medical History:  Diagnosis Date   Complication of anesthesia    nausea and vomiting    Past Surgical History:  Procedure Laterality Date   BIOPSY  04/24/2020   Procedure: BIOPSY;  Surgeon: Doran Stabler, MD;  Location: [place];  Service: Gastroenterology;;   ESOPHAGOGASTRODUODENOSCOPY (EGD) WITH PROPOFOL N/A 04/24/2020   Procedure: ESOPHAGOGASTRODUODENOSCOPY (EGD) WITH PROPOFOL;  Surgeon: Doran Stabler, MD;  Location: [place];  Service: Gastroenterology;  Laterality: N/A;   IR US GUIDE BX ASP/DRAIN  04/30/2020    There were no vitals filed for this visit.   Subjective Assessment - 10/25/20 1007     Subjective Patient reports that she has been trying to go this week without her cane. She finds her right hip still gets a little weak and she starts to feel like her hip dips.    Patient Stated Goals Walk without cane, squat to get stuff off floor or out of low cabinet, improve getting in/out of car    Currently in Pain? No/denies                [facility] PT Assessment - 10/25/20 0001       Strength   Right Hip ABduction 3/5    Left Hip ABduction 3/5      Ambulation/Gait   Gait Comments Compensated trendelenburg on right                           [facility]  Adult PT Treatment/Exercise - 10/25/20 0001       Neuro Re-ed    Neuro Re-ed Details  Tandem stance 3 x 30 sec      Exercises   Exercises Knee/Hip      Knee/Hip Exercises: Aerobic   Nustep L5 x 5 min with LE only while taking subjective      Knee/Hip Exercises: Standing   Hip Extension 2 sets;10 reps    Extension Limitations slight forward trunk lean over bolster    Forward Step Up 2 sets;10 reps    Forward Step Up Limitations 4" step height    Other Standing Knee Exercises Hip hike on 2" 2 x 10      Knee/Hip Exercises: Seated   Sit to Sand 2 sets   8 reps     Knee/Hip Exercises: Supine   Bridges 2 sets;10 reps    Straight Leg Raises 2 sets;10 reps    Straight Leg Raises Limitations partial range secondary to strength deficit      Knee/Hip Exercises: Sidelying   Clams 2 x 12 with yellow  PT Education - 10/25/20 1010     Education Details HEP update    Person(s) Educated Patient    Methods Explanation;Demonstration;Verbal cues;Handout    Comprehension Verbalized understanding;Returned demonstration;Verbal cues required;Need further instruction              PT Short Term Goals - 10/18/20 0952       PT SHORT TERM GOAL #1   Title Patient will be I with initial HEP to progress with PT    Time 3    Period Weeks    Status New    Target Date 11/08/20      PT SHORT TERM GOAL #2   Title PT will review FOTO with patient by 3rd visit to understand expected progress    Time 3    Period Weeks    Status New    Target Date 11/08/20               PT Long Term Goals - 10/18/20 0953       PT LONG TERM GOAL #1   Title Patient will be I with final HEP to maintain progress from PT    Time 6    Period Weeks    Status New    Target Date 11/29/20      PT LONG TERM GOAL #2   Title Patient will report >/= 62% functional status on FOTO to indicate improved functional ability    Time 6    Period Weeks    Status New    Target Date  11/29/20      PT LONG TERM GOAL #3   Title Patient will demonsrate >/= 4-/5 MMT hip strength and >/= 4+/5 MMT knee strength to improve stair negotiation    Time 6    Period Weeks    Status New    Target Date 11/29/20      PT LONG TERM GOAL #4   Title Patient will demonstrate improved 5xSTS to </= 12 sec to reduce fall risk    Time 6    Period Weeks    Status New    Target Date 11/29/20      PT LONG TERM GOAL #5   Title Patient will demonstrate 6MWT >/= 1000 ft without AD to improve community access    Time 6    Period Weeks    Status New    Target Date 11/29/20                   Plan - 10/25/20 1011     Clinical Impression Statement Patient tolerated therapy well with no adverse effects. Therapy focused primarily on progressing hip strength with good tolerance and initiated balance training with tandem stance. She does continue to exhibit significant hip strength deficits leading to gait deviations. Updated HEP to progress hip strengthening. Patient would benefit from continued skilled PT to progress her mobility and strength in order to improve walking ability and maximize her functional mobility.    PT Treatment/Interventions ADLs/Self Care Home Management;Aquatic Therapy;Neuromuscular re-education;Balance training;Therapeutic exercise;Therapeutic activities;Functional mobility training;Stair training;Gait training;Patient/family education;Manual techniques;Passive range of motion;Energy conservation    PT Next Visit Plan Review HEP and progress PRN, review FOTO, progress generalized LE strength, step-ups/squats, initiate balance training    PT Home Exercise Plan [email or web address]    Consulted and Agree with Plan of Care Patient             Patient will benefit from skilled therapeutic intervention in order to improve the following deficits  and impairments:  Abnormal gait, Difficulty walking, Decreased strength, Decreased activity tolerance, Decreased endurance, Decreased  balance, Impaired flexibility  Visit Diagnosis: Muscle weakness (generalized)  Other abnormalities of gait and mobility     Problem List Patient Active Problem List   Diagnosis Date Noted   Leg weakness, bilateral 09/15/2020   Screening for malignant neoplasm of skin 05/31/2020   Exposure to confirmed case of COVID-19 05/18/2020   Immunocompromised state due to drug therapy ([place]) 05/18/2020   Protein-calorie malnutrition, severe 05/14/2020   Chronic kidney disease (CKD), stage IV (severe) (HCC)    Chronic pain syndrome    Cancer associated pain    Pressure injury of skin 05/06/2020   Debility    Acute on chronic anemia    Thrombocytopenia ([place]) 04/26/2020   Esophageal candidiasis ([place]) 04/26/2020   Normocytic anemia 04/26/2020   CKD (chronic kidney disease) stage 4, GFR 15-29 ml/min ([place]) 04/26/2020   Wound of right groin 04/26/2020   Gastric ulcer without hemorrhage or perforation 04/26/2020   Malnutrition of moderate degree 04/26/2020   Acute osteomyelitis of left pelvic region and thigh ([place])    Pressure ulcer of coccygeal region, stage 2 ([place]) 04/21/2020   Weakness 04/20/2020   Acute deep vein thrombosis (DVT) of femoral vein ([place]) 12/05/2019   Iron deficiency anemia 11/10/2019   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([place]) 11/06/2019   Sprain of anterior talofibular ligament of right ankle 11/22/2016   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   Aftercare following organ transplant 02/24/2013   Immunosuppression ([place]) 02/24/2013   Nausea 02/24/2013   Postoperative abdominal pain 02/24/2013   Ureteral stent displacement ([place]) 02/24/2013   Kidney transplant status 11/22/2012   HTN (hypertension) 11/22/2012   Hyperparathyroidism, secondary renal ([place]) 11/22/2012   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

## 2020-10-26 ENCOUNTER — Telehealth: Payer: Self-pay

## 2020-10-26 ENCOUNTER — Encounter: Payer: Self-pay | Admitting: Gynecologic Oncology

## 2020-10-26 NOTE — Telephone Encounter (Signed)
Spoke with Christina Mack and confirmed her appointment for Friday with Dr. Berline Lopes. Patient states she would like to move her care from [facility] to [facility].

## 2020-10-28 ENCOUNTER — Telehealth: Payer: Self-pay | Admitting: Oncology

## 2020-10-28 NOTE — Telephone Encounter (Signed)
Christina Mack called and said she is running a fever of 101 and has a runny nose.  She had a negative covid test but does not feel comfortable coming in tomorrow.  She would like to reschedule her appointment tomorrow with Dr. Berline Lopes to next week.  Apt rescheduled to 11/05/20 at 11:15.

## 2020-10-29 ENCOUNTER — Ambulatory Visit: Admitting: Gynecologic Oncology

## 2020-11-01 ENCOUNTER — Ambulatory Visit: Admitting: Physical Therapy

## 2020-11-02 ENCOUNTER — Other Ambulatory Visit: Payer: Self-pay

## 2020-11-02 ENCOUNTER — Ambulatory Visit
Admission: RE | Admit: 2020-11-02 | Discharge: 2020-11-02 | Disposition: A | Discharge status: Home / Self Care | Source: Ambulatory Visit | Attending: Urgent Care | Admitting: Urgent Care

## 2020-11-02 VITALS — BP 137/79 | HR 102 | Temp 98.6°F | Resp 18

## 2020-11-02 DIAGNOSIS — Z940 Kidney transplant status: Secondary | ICD-10-CM

## 2020-11-02 DIAGNOSIS — N184 Chronic kidney disease, stage 4 (severe): Secondary | ICD-10-CM | POA: Diagnosis not present

## 2020-11-02 DIAGNOSIS — R300 Dysuria: Secondary | ICD-10-CM

## 2020-11-02 DIAGNOSIS — N3001 Acute cystitis with hematuria: Secondary | ICD-10-CM

## 2020-11-02 HISTORY — DX: Kidney transplant status: Z94.0

## 2020-11-02 LAB — POCT URINALYSIS DIP (MANUAL ENTRY)
Bilirubin, UA: NEGATIVE
Glucose, UA: NEGATIVE mg/dL
Ketones, POC UA: NEGATIVE mg/dL
Nitrite, UA: NEGATIVE
Protein Ur, POC: 100 mg/dL — AB
Spec Grav, UA: 1.02 (ref 1.010–1.025)
Urobilinogen, UA: 0.2 E.U./dL
pH, UA: 5.5 (ref 5.0–8.0)

## 2020-11-02 MED ORDER — KETOROLAC TROMETHAMINE 30 MG/ML IJ SOLN
30.0000 mg | Freq: Once | INTRAMUSCULAR | Status: DC
Start: 2020-11-02 — End: 2020-11-02

## 2020-11-02 MED ORDER — CEPHALEXIN 250 MG PO CAPS: 250.0000 mg | ORAL_CAPSULE | Freq: Three times a day (TID) | ORAL | 0 refills | Status: DC

## 2020-11-02 NOTE — ED Provider Notes (Signed)
[place]   MRN: [id number] DOB: 01-Dec-1965  Subjective:   Christina Mack is a 55 y.o. female with pmh of CKD IV, kidney transplant 2014 presenting for 5 day history of persistent dysuria, worse at night, urinary frequency, suprapubic pressure/pain, slight spotting in her urine. Denies fever, n/v, flank pain. She is not on dialysis, last GFR was 21 in June 2022. Creatinine from the same lab test was 2.6 mg/dL.   No current facility-administered medications for this encounter.  Current Outpatient Medications:    acetaminophen (TYLENOL) 500 MG tablet, Take 2 tablets (1,000 mg total) by mouth 3 (three) times daily. (Patient not taking: No sig reported), Disp: 30 tablet, Rfl: 0   apixaban (ELIQUIS) 2.5 MG TABS tablet, TAKE 1 TABLET (2.5 MG TOTAL) BY MOUTH DAILY., Disp: 60 tablet, Rfl: 0   Cholecalciferol 125 MCG (5000 UT) capsule, Take by mouth., Disp: , Rfl:    fluticasone (FLONASE) 50 MCG/ACT nasal spray, Place 1 spray into both nostrils daily as needed. (Patient not taking: Reported on 10/26/2020), Disp: , Rfl:    mycophenolate (CELLCEPT) 250 MG capsule, Take 250 mg by mouth 2 (two) times daily. (Patient not taking: No sig reported), Disp: , Rfl:    oxymetazoline (AFRIN) 0.05 % nasal spray, Place 1 spray into both nostrils 2 (two) times daily as needed for congestion. For 2-3 days if nose bleed recurrs (Patient not taking: Reported on 10/26/2020), Disp: 30 mL, Rfl: 0   pantoprazole (PROTONIX) 40 MG tablet, TAKE 1 TABLET (40 MG TOTAL) BY MOUTH 2 (TWO) TIMES DAILY BEFORE A MEAL., Disp: 60 tablet, Rfl: 0   polyethylene glycol powder (GLYCOLAX/MIRALAX) 17 GM/SCOOP powder, TAKE 1 SCOOP (17 G) BY MOUTH DAILY. (Patient not taking: Reported on 10/26/2020), Disp: 510 g, Rfl: 0   predniSONE (DELTASONE) 5 MG tablet, Take 5 mg by mouth daily with breakfast., Disp: , Rfl:    PROGRAF 1 MG capsule, Take 1 mg by mouth 2 (two) times daily. 2mg  in morning 1mg  at night, Disp: , Rfl:    Allergies   Allergen Reactions   Oxycodone-Acetaminophen Itching, Nausea Only, Other (See Comments), Nausea And Vomiting and Rash    Other Reaction: HA Other Reaction: HA    Zosyn [Piperacillin Sod-Tazobactam So] Other (See Comments)    Thrombocytopenia   Hydromorphone Nausea And Vomiting, Rash and Other (See Comments)   Amlodipine Other (See Comments)    Ankle swelling   Codeine     vomiting    Past Medical History:  Diagnosis Date   Complication of anesthesia    nausea and vomiting   Kidney transplant recipient      Past Surgical History:  Procedure Laterality Date   BIOPSY  04/24/2020   Procedure: BIOPSY;  Surgeon: Doran Stabler, MD;  Location: [place];  Service: Gastroenterology;;   ESOPHAGOGASTRODUODENOSCOPY (EGD) WITH PROPOFOL N/A 04/24/2020   Procedure: ESOPHAGOGASTRODUODENOSCOPY (EGD) WITH PROPOFOL;  Surgeon: Doran Stabler, MD;  Location: [place];  Service: Gastroenterology;  Laterality: N/A;   IR US GUIDE BX ASP/DRAIN  04/30/2020   KIDNEY TRANSPLANT  2001   KIDNEY TRANSPLANT  2014    Family History  Problem Relation Age of Onset   Cerebral aneurysm Mother    AAA (abdominal aortic aneurysm) Brother    Breast cancer Paternal Aunt    Heart attack Maternal Grandmother    Brain cancer Maternal Grandfather    Colon cancer Neg Hx    Ovarian cancer Neg Hx    Endometrial cancer Neg  Hx    Pancreatic cancer Neg Hx    Prostate cancer Neg Hx     Social History   Tobacco Use   Smoking status: Never   Smokeless tobacco: Never  Vaping Use   Vaping Use: Never used  Substance Use Topics   Alcohol use: Not Currently   Drug use: Never    ROS   Objective:   Vitals: BP 137/79 (BP Location: Left Arm)   Pulse (!) 102   Temp 98.6 F (37 C) (Oral)   Resp 18   SpO2 97%   Physical Exam Constitutional:      General: She is not in acute distress.    Appearance: Normal appearance. She is well-developed. She is not ill-appearing, toxic-appearing or  diaphoretic.  HENT:     Head: Normocephalic and atraumatic.     Nose: Nose normal.     Mouth/Throat:     Mouth: Mucous membranes are moist.     Pharynx: Oropharynx is clear.  Eyes:     General: No scleral icterus.       Right eye: No discharge.        Left eye: No discharge.     Extraocular Movements: Extraocular movements intact.     Conjunctiva/sclera: Conjunctivae normal.     Pupils: Pupils are equal, round, and reactive to light.  Cardiovascular:     Rate and Rhythm: Normal rate.  Pulmonary:     Effort: Pulmonary effort is normal.  Abdominal:     General: Bowel sounds are normal. There is no distension.     Palpations: Abdomen is soft. There is no mass.     Tenderness: There is abdominal tenderness (mild pelvic). There is no right CVA tenderness, left CVA tenderness, guarding or rebound.  Skin:    General: Skin is warm and dry.  Neurological:     General: No focal deficit present.     Mental Status: She is alert and oriented to person, place, and time.  Psychiatric:        Mood and Affect: Mood normal.        Behavior: Behavior normal.        Thought Content: Thought content normal.        Judgment: Judgment normal.    Results for orders placed or performed during the hospital encounter of 11/02/20 (from the past 24 hour(s))  POCT urinalysis dipstick     Status: Abnormal   Collection Time: 11/02/20 10:37 AM  Result Value Ref Range   Color, UA yellow yellow   Clarity, UA hazy (A) clear   Glucose, UA negative negative mg/dL   Bilirubin, UA negative negative   Ketones, POC UA negative negative mg/dL   Spec Grav, UA 1.020 1.010 - 1.025   Blood, UA large (A) negative   pH, UA 5.5 5.0 - 8.0   Protein Ur, POC =100 (A) negative mg/dL   Urobilinogen, UA 0.2 0.2 or 1.0 E.U./dL   Nitrite, UA Negative Negative   Leukocytes, UA Moderate (2+) (A) Negative    Assessment and Plan :   PDMP not reviewed this encounter.  1. Acute cystitis with hematuria   2. Dysuria   3.  Chronic kidney disease (CKD), stage IV (severe) ([place])   4. Kidney transplant recipient     I calculated her creatinine clearance at 4ml/min using labs from 09/2020. Start Keflex 250mg  TID to cover for acute cystitis, urine culture pending.  Recommended limiting urinary irritants, hydrating well with plain water. Counseled patient on potential  for adverse effects with medications prescribed/recommended today, ER and return-to-clinic precautions discussed, patient verbalized understanding.    Jaynee Eagles, [place] 11/02/20 1042

## 2020-11-02 NOTE — ED Triage Notes (Signed)
Pt c/o pain when voiding and at night. States she thinks it's a "UTI." Describes pain as achy, states voiding more frequently and notes scant blood. Also states she has received a kidney transplant. States fever comes and goes all starting last Thursday.

## 2020-11-03 ENCOUNTER — Encounter: Payer: Self-pay | Admitting: Gynecologic Oncology

## 2020-11-04 ENCOUNTER — Telehealth: Payer: Self-pay

## 2020-11-04 NOTE — Telephone Encounter (Signed)
Christina Mack had to r/s her appointment as she needs to take care of her sick grandchild tomorrow for her single parent daughter who needs to work. She r/s to 11-15-20.

## 2020-11-05 ENCOUNTER — Ambulatory Visit: Payer: Self-pay | Admitting: Gynecologic Oncology

## 2020-11-05 DIAGNOSIS — C519 Malignant neoplasm of vulva, unspecified: Secondary | ICD-10-CM

## 2020-11-08 ENCOUNTER — Encounter: Payer: Self-pay | Admitting: Physical Therapy

## 2020-11-08 ENCOUNTER — Ambulatory Visit: Attending: Physical Medicine and Rehabilitation | Admitting: Physical Therapy

## 2020-11-08 ENCOUNTER — Other Ambulatory Visit: Payer: Self-pay

## 2020-11-08 DIAGNOSIS — M6281 Muscle weakness (generalized): Secondary | ICD-10-CM | POA: Diagnosis present

## 2020-11-08 DIAGNOSIS — R2689 Other abnormalities of gait and mobility: Secondary | ICD-10-CM | POA: Insufficient documentation

## 2020-11-08 NOTE — Patient Instructions (Signed)
Access Code: [email or web address] URL: [email or web address] Date: 11/08/2020 Prepared by: Hilda Blades  Exercises Modified Marcello Moores Stretch - 1-2 x daily - 7 x weekly - 2 reps - 60 hold Bridge - 1 x daily - 5 x weekly - 3 sets - 10 reps - 3 hold Active Straight Leg Raise with Quad Set - 1 x daily - 7 x weekly - 3 sets - 10 reps Sidelying Hip Abduction - 1 x daily - 5 x weekly - 3 sets - 10 reps Seated Knee Extension with Resistance - 1 x daily - 5 x weekly - 3 sets - 10 reps Seated Hamstring Curls with Resistance - 1 x daily - 5 x weekly - 3 sets - 10 reps Sit to Stand - 1 x daily - 5 x weekly - 3 sets - 10 reps Partial Lunge with Chair - 1 x daily - 5 x weekly - 2 sets - 8 reps Standing Hip Extension on Swiss Ball - 1 x daily - 7 x weekly - 2 sets - 10 reps Step Up - 1 x daily - 7 x weekly - 2 sets - 10 reps Heel Raises with Counter Support - 1 x daily - 7 x weekly - 2 sets - 15 reps Standing Tandem Balance with Counter Support - 1 x daily - 7 x weekly - 3 sets - 30 hold

## 2020-11-08 NOTE — Therapy (Signed)
[place], [place], [address] Phone: [phone]   Fax:  [phone]  Physical Therapy Treatment  Patient Details  Name: Christina Mack MRN: [id number] Date of Birth: 1965-09-18 Referring Provider (PT): Courtney Heys, MD   Encounter Date: 11/08/2020   PT End of Session - 11/08/20 1007     Visit Number 3    Number of Visits 6    Date for PT Re-Evaluation 11/29/20    Authorization Type TRICARE    PT Start Time 1000    PT Stop Time 1045    PT Time Calculation (min) 45 min    Activity Tolerance Patient tolerated treatment well    Behavior During Therapy [facility] for tasks assessed/performed             Past Medical History:  Diagnosis Date   Complication of anesthesia    nausea and vomiting   Kidney transplant recipient     Past Surgical History:  Procedure Laterality Date   BIOPSY  04/24/2020   Procedure: BIOPSY;  Surgeon: Doran Stabler, MD;  Location: [place];  Service: Gastroenterology;;   ESOPHAGOGASTRODUODENOSCOPY (EGD) WITH PROPOFOL N/A 04/24/2020   Procedure: ESOPHAGOGASTRODUODENOSCOPY (EGD) WITH PROPOFOL;  Surgeon: Doran Stabler, MD;  Location: [place];  Service: Gastroenterology;  Laterality: N/A;   IR US GUIDE BX ASP/DRAIN  04/30/2020   KIDNEY TRANSPLANT  2001   KIDNEY TRANSPLANT  2014    There were no vitals filed for this visit.   Subjective Assessment - 11/08/20 1005     Subjective Patient reports she hasn't used her cane in a while, exercises are still going well. She had to cancel last weeks visit due to not feeling well.    Patient Stated Goals Walk without cane, squat to get stuff off floor or out of low cabinet, improve getting in/out of car    Currently in Pain? No/denies                [facility] PT Assessment - 11/08/20 0001       Strength   Right Hip ABduction 3/5    Left Hip ABduction 3/5                           [facility] Adult PT  Treatment/Exercise - 11/08/20 0001       Neuro Re-ed    Neuro Re-ed Details  Tandem stance 3 x 30 sec on Airex      Exercises   Exercises Knee/Hip      Knee/Hip Exercises: Aerobic   Nustep L5 x 5 min with LE only while taking subjective      Knee/Hip Exercises: Standing   Heel Raises 2 sets;15 reps    Forward Step Up 2 sets;10 reps    Forward Step Up Limitations 4" step height    Other Standing Knee Exercises Hip hike on 2" 2 x 10      Knee/Hip Exercises: Seated   Sit to Sand 2 sets;10 reps;without UE support      Knee/Hip Exercises: Supine   Bridges 2 sets;10 reps   3 sec hold   Straight Leg Raises 2 sets;10 reps      Knee/Hip Exercises: Sidelying   Hip ABduction 2 sets;10 reps    Clams 2 x 15 with yellow                    PT Education - 11/08/20  36     Education Details FOTO, HEP    Person(s) Educated Patient    Methods Explanation;Demonstration;Verbal cues    Comprehension Verbalized understanding;Returned demonstration;Verbal cues required;Need further instruction              PT Short Term Goals - 11/08/20 1008       PT SHORT TERM GOAL #1   Title Patient will be I with initial HEP to progress with PT    Time 3    Period Weeks    Status On-going    Target Date 11/08/20      PT SHORT TERM GOAL #2   Title PT will review FOTO with patient by 3rd visit to understand expected progress    Baseline reviewed 3rd visit    Time 3    Period Weeks    Status Achieved    Target Date 11/08/20               PT Long Term Goals - 10/18/20 0953       PT LONG TERM GOAL #1   Title Patient will be I with final HEP to maintain progress from PT    Time 6    Period Weeks    Status New    Target Date 11/29/20      PT LONG TERM GOAL #2   Title Patient will report >/= 62% functional status on FOTO to indicate improved functional ability    Time 6    Period Weeks    Status New    Target Date 11/29/20      PT LONG TERM GOAL #3   Title Patient  will demonsrate >/= 4-/5 MMT hip strength and >/= 4+/5 MMT knee strength to improve stair negotiation    Time 6    Period Weeks    Status New    Target Date 11/29/20      PT LONG TERM GOAL #4   Title Patient will demonstrate improved 5xSTS to </= 12 sec to reduce fall risk    Time 6    Period Weeks    Status New    Target Date 11/29/20      PT LONG TERM GOAL #5   Title Patient will demonstrate 6MWT >/= 1000 ft without AD to improve community access    Time 6    Period Weeks    Status New    Target Date 11/29/20                   Plan - 11/08/20 1008     Clinical Impression Statement Patient tolerated therapy well with no adverse effects. Therapy continued to focus on progressing strength, especially glute med. She is progressing well with her strengthening but does continue to exhibit trendelenburg due to hip weakness. Progressed balance to unstable surface, she did require occasional touch down for support. Updated HEP this visit to continue progression of strength and balance. Patient would benefit from continued skilled PT to progress her mobility and strength in order to improve walking ability and maximize her functional mobility.    PT Treatment/Interventions ADLs/Self Care Home Management;Aquatic Therapy;Neuromuscular re-education;Balance training;Therapeutic exercise;Therapeutic activities;Functional mobility training;Stair training;Gait training;Patient/family education;Manual techniques;Passive range of motion;Energy conservation    PT Next Visit Plan Review HEP and progress PRN, progress generalized LE strength, step-ups/squats, balance training    PT Home Exercise Plan [email or web address]    Consulted and Agree with Plan of Care Patient  Patient will benefit from skilled therapeutic intervention in order to improve the following deficits and impairments:  Abnormal gait, Difficulty walking, Decreased strength, Decreased activity tolerance, Decreased endurance,  Decreased balance, Impaired flexibility  Visit Diagnosis: Muscle weakness (generalized)  Other abnormalities of gait and mobility     Problem List Patient Active Problem List   Diagnosis Date Noted   Leg weakness, bilateral 09/15/2020   Screening for malignant neoplasm of skin 05/31/2020   Exposure to confirmed case of COVID-19 05/18/2020   Immunocompromised state due to drug therapy ([place]) 05/18/2020   Protein-calorie malnutrition, severe 05/14/2020   Chronic kidney disease (CKD), stage IV (severe) (HCC)    Chronic pain syndrome    Cancer associated pain    Pressure injury of skin 05/06/2020   Debility    Acute on chronic anemia    Thrombocytopenia ([place]) 04/26/2020   Esophageal candidiasis ([place]) 04/26/2020   Normocytic anemia 04/26/2020   CKD (chronic kidney disease) stage 4, GFR 15-29 ml/min ([place]) 04/26/2020   Wound of right groin 04/26/2020   Gastric ulcer without hemorrhage or perforation 04/26/2020   Malnutrition of moderate degree 04/26/2020   Acute osteomyelitis of left pelvic region and thigh ([place])    Pressure ulcer of coccygeal region, stage 2 ([place]) 04/21/2020   Weakness 04/20/2020   Acute deep vein thrombosis (DVT) of femoral vein ([place]) 12/05/2019   Iron deficiency anemia 11/10/2019   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([place]) 11/06/2019   Sprain of anterior talofibular ligament of right ankle 11/22/2016   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   Aftercare following organ transplant 02/24/2013   Immunosuppression ([place]) 02/24/2013   Nausea 02/24/2013   Postoperative abdominal pain 02/24/2013   Ureteral stent displacement ([place]) 02/24/2013   Kidney transplant status 11/22/2012   HTN (hypertension) 11/22/2012   Hyperparathyroidism, 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

## 2020-11-12 ENCOUNTER — Other Ambulatory Visit: Payer: Self-pay

## 2020-11-12 ENCOUNTER — Encounter: Payer: Self-pay | Admitting: Gynecologic Oncology

## 2020-11-12 ENCOUNTER — Telehealth: Payer: Self-pay

## 2020-11-12 ENCOUNTER — Inpatient Hospital Stay: Attending: Gynecologic Oncology | Admitting: Gynecologic Oncology

## 2020-11-12 VITALS — BP 137/82 | HR 99 | Temp 97.1°F | Resp 18 | Wt 123.4 lb

## 2020-11-12 DIAGNOSIS — Z923 Personal history of irradiation: Secondary | ICD-10-CM | POA: Diagnosis not present

## 2020-11-12 DIAGNOSIS — Z7952 Long term (current) use of systemic steroids: Secondary | ICD-10-CM | POA: Insufficient documentation

## 2020-11-12 DIAGNOSIS — C519 Malignant neoplasm of vulva, unspecified: Secondary | ICD-10-CM | POA: Diagnosis present

## 2020-11-12 DIAGNOSIS — R911 Solitary pulmonary nodule: Secondary | ICD-10-CM | POA: Insufficient documentation

## 2020-11-12 DIAGNOSIS — M869 Osteomyelitis, unspecified: Secondary | ICD-10-CM | POA: Insufficient documentation

## 2020-11-12 DIAGNOSIS — Z86718 Personal history of other venous thrombosis and embolism: Secondary | ICD-10-CM | POA: Diagnosis not present

## 2020-11-12 DIAGNOSIS — Z8739 Personal history of other diseases of the musculoskeletal system and connective tissue: Secondary | ICD-10-CM | POA: Insufficient documentation

## 2020-11-12 DIAGNOSIS — C774 Secondary and unspecified malignant neoplasm of inguinal and lower limb lymph nodes: Secondary | ICD-10-CM | POA: Diagnosis not present

## 2020-11-12 DIAGNOSIS — Z7901 Long term (current) use of anticoagulants: Secondary | ICD-10-CM | POA: Insufficient documentation

## 2020-11-12 DIAGNOSIS — Z79899 Other long term (current) drug therapy: Secondary | ICD-10-CM | POA: Diagnosis not present

## 2020-11-12 DIAGNOSIS — S3140XS Unspecified open wound of vagina and vulva, sequela: Secondary | ICD-10-CM | POA: Diagnosis not present

## 2020-11-12 DIAGNOSIS — Z940 Kidney transplant status: Secondary | ICD-10-CM | POA: Insufficient documentation

## 2020-11-12 DIAGNOSIS — Z9221 Personal history of antineoplastic chemotherapy: Secondary | ICD-10-CM | POA: Diagnosis not present

## 2020-11-12 DIAGNOSIS — S31109S Unspecified open wound of abdominal wall, unspecified quadrant without penetration into peritoneal cavity, sequela: Secondary | ICD-10-CM

## 2020-11-12 NOTE — Patient Instructions (Addendum)
It was a pleasure meeting you today.  I have put in orders for a PET scan as well as a pelvic MRI. Please let your PCP know (we will too) to help get these studies approved.  We have also placed a wound care referral. My hope is that they will agree with trying hyperbaric oxygen. I will look for their assessment once you have seen them.  I will plan to see you back in 3 months for follow-up. If anything comes up before your next visit, please don't hesitate to call the clinic at [phone].  Please reach out to her PCP about getting a cologuard (colon cancer screening) test performed.

## 2020-11-12 NOTE — Telephone Encounter (Signed)
Dr. Lulu Riding office note successfully faxed to Dr. Ree Kida office. Confirmation received.

## 2020-11-12 NOTE — Progress Notes (Signed)
GYNECOLOGIC ONCOLOGY NEW PATIENT CONSULTATION   Patient Name: Christina Mack  Patient Age: 55 y.o. Date of Service: 11/12/20 Primary care provider: Norina Buzzard, MD [address] [place],  [place] [id number]   Primary Care Provider: Norina Buzzard, MD Consulting Provider: Jeral Pinch, MD   Assessment/Plan:  [phone] with history of stage IIIC vulvar cancer presenting to transfer/re-establish oncology care.  The patient and I reviewed her history including initial diagnosis as well as treatment.  We also discussed hospitalizations earlier this year and treatment plans that were created after or during those hospitalizations.  From a cancer standpoint.  I do not see evidence of active disease on her exam today.  She is in need of updated imaging.  I have ordered a PET scan and we will communicate with her primary care provider as it sounds like there is some issue with getting insurance approval if her PCP is not involved.  From a surveillance standpoint, I would like to proceed with follow-up visits every 3 months.  In terms of her history of osteomyelitis.  The patient last saw infectious disease several months ago.  She was supposed to undergo pelvic MRI to confirm radiologic evidence of treatment as well as to follow-up on fluid collection noted within the wound.  From a clinical standpoint, she has no exam findings or symptoms concerning for ongoing infection.  I have reordered a pelvic MRI without contrast which has been scheduled.  I will reach out to infectious disease depending on findings from imaging.  The patient is doing well from a symptom management standpoint and is not on any sort of chronic pain medication.  She uses Tylenol as needed when she does have discomfort or irritation.  She continues to have drainage from her wound and I suspect that there will not be much more wound healing given the extent of her cancer as well as treatment effect.  I do  think it would be worth considering hyperbaric oxygen therapy.  I will place a referral to the wound care clinic again for their evaluation.  Hopefully, we can get the patient in for hyperbaric oxygen treatment.  In terms of health maintenance, patient has never had a colonoscopy.  I encouraged her to talk to her PCP about doing a Cologuard.  A copy of this note was sent to the patient's PCP.  70 minutes of total time was spent for this patient encounter, including preparation, face-to-face counseling with the patient and coordination of care, and documentation of the encounter.    Jeral Pinch, MD  Division of Gynecologic Oncology  Department of Obstetrics and Gynecology  [facility]  ___________________________________________  Chief Complaint: Chief Complaint  Patient presents with   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[facility])    History of Present Illness:  Christina Mack is a 55 y.o. y.o. female who is seen for follow-up due to desire to transfer cancer care to [facility].  Patient has a history of vulvar cancer with details outlined below.  Briefly, she has a history of high-grade dysplasia that was treated initially more than 10 years ago.  She ultimately presented with a very large lesion involving her right vulva and groin and saw Dr. Juleen China in June of last year.  Due to extent of disease, the patient underwent primary chemoradiation.  Her posttreatment course was complicated by several admissions, 1 of which was due to osteomyelitis and chronic nonhealing open wound.  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 Juleen China)  Initial exam by Dr. Juleen China on 09/08/19: right vulva was completely involved by a large  necrotic mass extending from the inguinal crease towards the anus measuring 12x5cm. It involved the distal vagina and extended to involve the left labia as well. It was difficult to evaluate her vaginal involvement due to pain.   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([place])  09/10/2019 Initial Biopsy   Vulvar biopsy: at least VIN3, biopsy demonstrates extreme ulceration, inflammation and granulation tissue. Underlying invasion very difficult to rule out.   09/16/2019 Imaging   PET: IMPRESSION:  1.  Large hypermetabolic mass in the right side of the perineum, consistent with known vulvar malignancy.  2.  Hypermetabolic right groin lymphadenopathy is consistent with nodal involvement.  3.  Tiny right apical pulmonary nodule is below PET resolution.    09/16/2019 Initial Diagnosis   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([place])   11/03/2019 - 01/15/2020 Radiation Therapy   IMRT. She received 45GY/25 fractions to the pelvic and inguinal nodes and vulvar mass. The vulvar mass and enlarged inguinal nodes received a boost dose of 1800cGY/10 fractions for total dose of 63Gy/35 fractions.  Treated in [place]. Diagnosed with DVT several weeks into treatment. Missed treatments due to feeling poorly. Received 4 Cisplatin (stopped due to elevated Cr) - 8/5, 8/11, 8/18, 8/25   04/03/2020 Imaging   CT A/P w/o contrast: 1.  Redemonstration of the large right perineal mass, with new extensive surrounding soft tissue stranding and gas which extends along the bilateral anterior pubic bones and anterior pelvis.  I'm unable to exclude developing necrotizing fasciitis.  No definite drainable abscess collection is identified.  2.  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  3.  Diffuse edema of the pelvic mesentery with mild presacral fluid, concerning for mesenteritis.  No definite drainable intraperitoneal fluid collection identified.  4.  Suspected bowel ileus or gastroenteritis.  5.  Moderately distended gallbladder with  heterogeneous dependent material suggestive of sludge and possible gallstones.  6.  Hepatosplenomegaly.  7.  Suspected anemia.  8.  Moderate diffuse urinary bladder wall thickening suggesting chronic cystitis.    04/20/2020 - 05/05/2020 Hospital Admission   Admitted in the setting of right groin wound. Had been admitted to [facility] 1/2 with concerns for soft tissue infection. Started on vanc/zosyn --> oral levo/flagyl to complete 3 weeks of treatment ( EOT 1/22). Pelvic MRI at [facility] showed multilocular abscess of left pelvis, myositis, nondisplaced fracture of inferior right pubic ramus, concern for osteomytlitis. IR drainage of abscess was performed - VRE on culture. ID recommended IV dapto --> tedezolid oupatient (as well as levo + flagyl) for 6 weeks total.    04/26/2020 Imaging   MRI pelvis w/o contrast: IMPRESSION: 1. Large area of ulceration with sinus tract seen within the right labial fold with diffuse surrounding phlegmon and extensive myositis in the bilateral adductor and anterior muscular compartments of both thighs. 2. There is a multilocular fluid collection extending in the inferior left pelvic soft tissues surrounding the pubic rami and extending to the anterior upper left thigh, consistent with a multilocular abscess. 3. Nondisplaced fracture of the inferior right pubic rami with findings suggestive osteomyelitis involving the pubic rami and anterior pubic symphysis. 4. Findings suggestive of osteomyelitis involving the anterior left  pubic symphysis.   05/05/2020 - 05/17/2020 Hospital Admission   Admitted for inpatient rehab given debility.   07/27/2020 Imaging   PET: Significant decrease in size and avidity of vulvar lesion. Air and soft tissue inflammation. Mild PET hypermetabolic activity. Resolution of inguinal LN activity. 66mm LU love groundglass nodule, mild hypermetabolic activity.   09/14/2020 Imaging   CT Chest w/o contrast: IMPRESSION:  1.  Resolution of left upper  lobe nodule and associated groundglass opacity.  2.  Stable 5 mm nodular opacity right upper lobe.  3.  Stable 3 mm nodule right lower lobe.     The patient last saw her oncologist, Dr. Juleen China, in May of this year.  At that point plan was for repeat PET in 8 weeks as well as repeat chest imaging in 4 weeks given ground glass left upper lung nodule with mild hypermetabolic activity.  That CT scan was performed on 6/14 with resolution of her left upper lobe nodule and associated groundglass opacity.  She had a right lower lobe and right upper lobe nodule, each measuring 5 mm or less, that were both stable in size.  Patient was followed by infectious disease after her admission for soft tissue infection and osteomyelitis.  She got an extended course of antibiotics and last saw in May with a plan for repeat pelvic MRI.  This unfortunately has still not happen due to insurance and approval reasons.  The patient continued with wound care after her hospitalization in February.  She was able to take over the wound care herself and was discharged from the wound care clinic about a month ago.  She has now also started physical therapy.  She has had significant improvement in strength although still has difficulty with some movements due to weakness including squatting and kneeling.  She denies any vulvar or groin pain.  The open area of her wound has decreased in size slowly with time.  She is worried there is still an exposed bone.  She has continued drainage from the wound, using an ABD pad at night which she describes as at most half full with drainage and a poise pad during the day with minimal drainage on it.  She uses these both for her wound drainage but also some mild urine incontinence.  She has occasional bleeding from her wound if she sits for a long period of time, which also causes irritation.  She endorses a good appetite without nausea or emesis.  Her weight was 92 earlier this year after multiple  hospitalizations.  She has now 128 on her home scale.  She endorses normal daily bowel function and other than her mild incontinence, denies any other urinary symptoms.  Her past medical history is notable for 2 kidney transplants, in 2001 in 2014.  She follows with [facility] and was last seen in June.  She also was diagnosed with a DVT last September during treatment and has been on Eliquis since.  Her primary care provider is managing this.  Patient lives in [place] with her husband.  She denies any tobacco, alcohol, or drug use.  PAST MEDICAL HISTORY:  Past Medical History:  Diagnosis Date   Complication of anesthesia    nausea and vomiting   DVT (deep venous thrombosis) ([place])    History of osteomyelitis    Kidney transplant recipient    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[facility])      PAST SURGICAL HISTORY:  Past Surgical History:  Procedure Laterality Date   BIOPSY  04/24/2020  Procedure: BIOPSY;  Surgeon: Doran Stabler, MD;  Location: [place];  Service: Gastroenterology;;   ESOPHAGOGASTRODUODENOSCOPY (EGD) WITH PROPOFOL N/A 04/24/2020   Procedure: ESOPHAGOGASTRODUODENOSCOPY (EGD) WITH PROPOFOL;  Surgeon: Doran Stabler, MD;  Location: [place];  Service: Gastroenterology;  Laterality: N/A;   IR US GUIDE BX ASP/DRAIN  04/30/2020   KIDNEY TRANSPLANT  2001   KIDNEY TRANSPLANT  2014   LEEP     TOTAL ABDOMINAL HYSTERECTOMY     ovaries left in situ    OB/GYN HISTORY:  OB History  Gravida Para Term Preterm AB Living  1 1          SAB IAB Ectopic Multiple Live Births               # Outcome Date GA Lbr Len/2nd Weight Sex Delivery Anes PTL Lv  1 Para             No LMP recorded. Patient has had a hysterectomy.  Age at menarche: 19 Age at menopause: Menses stopped at 37, denies menopausal symptoms Hx of HRT: No Hx of STDs: Denies Last pap: Patient reports this was in 2021 History of abnormal pap smears: Yes, underwent LEEP followed by hysterectomy given what sounds  like cervical dysplasia  SCREENING STUDIES:  Last mammogram: Cannot remember when, has been a number of years  Last colonoscopy: N/A  MEDICATIONS: Outpatient Encounter Medications as of 11/12/2020  Medication Sig   acetaminophen (TYLENOL) 500 MG tablet Take 2 tablets (1,000 mg total) by mouth 3 (three) times daily. (Patient not taking: No sig reported)   apixaban (ELIQUIS) 2.5 MG TABS tablet TAKE 1 TABLET (2.5 MG TOTAL) BY MOUTH DAILY.   cephALEXin (KEFLEX) 250 MG capsule Take 1 capsule (250 mg total) by mouth 3 (three) times daily.   Cholecalciferol 125 MCG (5000 UT) capsule Take by mouth.   fluticasone (FLONASE) 50 MCG/ACT nasal spray Place 1 spray into both nostrils daily as needed. (Patient not taking: No sig reported)   mycophenolate (CELLCEPT) 250 MG capsule Take 250 mg by mouth 2 (two) times daily. (Patient not taking: No sig reported)   oxymetazoline (AFRIN) 0.05 % nasal spray Place 1 spray into both nostrils 2 (two) times daily as needed for congestion. For 2-3 days if nose bleed recurrs (Patient not taking: No sig reported)   pantoprazole (PROTONIX) 40 MG tablet TAKE 1 TABLET (40 MG TOTAL) BY MOUTH 2 (TWO) TIMES DAILY BEFORE A MEAL.   polyethylene glycol powder (GLYCOLAX/MIRALAX) 17 GM/SCOOP powder TAKE 1 SCOOP (17 G) BY MOUTH DAILY. (Patient not taking: No sig reported)   predniSONE (DELTASONE) 5 MG tablet Take 5 mg by mouth daily with breakfast.   PROGRAF 1 MG capsule Take 1 mg by mouth 2 (two) times daily. 2mg  in morning 1mg  at night   No facility-administered encounter medications on file as of 11/12/2020.    ALLERGIES:  Allergies  Allergen Reactions   Oxycodone-Acetaminophen Itching, Nausea Only, Other (See Comments), Nausea And Vomiting and Rash    Other Reaction: HA Other Reaction: HA    Zosyn [Piperacillin Sod-Tazobactam So] Other (See Comments)    Thrombocytopenia   Hydromorphone Nausea And Vomiting, Rash and Other (See Comments)   Amlodipine Other (See Comments)     Ankle swelling   Codeine     vomiting     FAMILY HISTORY:  Family History  Problem Relation Age of Onset   Cerebral aneurysm Mother    AAA (abdominal aortic aneurysm) Brother  Breast cancer Paternal Aunt    Heart attack Maternal Grandmother    Brain cancer Maternal Grandfather    Colon cancer Neg Hx    Ovarian cancer Neg Hx    Endometrial cancer Neg Hx    Pancreatic cancer Neg Hx    Prostate cancer Neg Hx      SOCIAL HISTORY:  Social Connections: Not on file    REVIEW OF SYSTEMS:  Denies appetite changes, fevers, chills, fatigue, unexplained weight changes. Denies hearing loss, neck lumps or masses, mouth sores, ringing in ears or voice changes. Denies cough or wheezing.  Denies shortness of breath. Denies chest pain or palpitations. Denies leg swelling. Denies abdominal distention, pain, blood in stools, constipation, diarrhea, nausea, vomiting, or early satiety. Denies pain with intercourse, dysuria, frequency, hematuria or incontinence. Denies hot flashes, pelvic pain, vaginal bleeding.   Denies joint pain, back pain or muscle pain/cramps. Denies itching, rash, or wounds. Denies dizziness, headaches, numbness or seizures. Denies swollen lymph nodes or glands, denies easy bruising or bleeding. Denies anxiety, depression, confusion, or decreased concentration.  Physical Exam:  Vital Signs for this encounter:  Blood pressure 137/82, pulse 99, temperature (!) 97.1 F (36.2 C), temperature source Tympanic, resp. rate 18, weight 123 lb 6.4 oz (56 kg), SpO2 100 %. Body mass index is 21.18 kg/m. General: Alert, oriented, no acute distress.  HEENT: Normocephalic, atraumatic. Sclera anicteric.  Chest: Clear to auscultation bilaterally. No wheezes, rhonchi, or rales. Cardiovascular: Regular rate and rhythm, no murmurs, rubs, or gallops.  Abdomen: Normoactive bowel sounds. Soft, nondistended, nontender to palpation. No masses or hepatosplenomegaly appreciated. No palpable  fluid wave.  Pfannenstiel incision. Extremities: Grossly normal range of motion. Warm, well perfused. No edema bilaterally.  Skin: No rashes or lesions.  Lymphatics: No cervical, supraclavicular, or left sided inguinal adenopathy.  GU: Exam performed with pictures taken in media.  The patient has an open wound measuring approximately 5-1/2 x 2 cm encompassing her right groin with complete lack of a right labia majora or minora.  The left labia is somewhat edematous.  Urethra is normal in appearance.  The superior aspect of her open incision has fibrinous exudate over what appears to be previously treated tumor versus muscle.  The inferior aspect has a less than 1cm incision that is much deeper than the rest of the wound.  What I think is either ligament versus her pubic ramus is exposed in the inferior aspect of the incision with some exudate over it.  No bleeding or discharge noted within the wound.  No significant tenderness with palpation.  No obvious findings of tumor.  Patient declined speculum or internal exam.  LABORATORY AND RADIOLOGIC DATA:  Outside medical records were reviewed to synthesize the above history, along with the history and physical obtained during the visit.   Lab Results  Component Value Date   WBC 6.7 06/08/2020   HGB 7.8 (L) 06/08/2020   HCT 23.8 (L) 06/08/2020   PLT 177 06/08/2020   GLUCOSE 199 (H) 06/08/2020   ALT 12 05/06/2020   AST 17 05/06/2020   NA 133 (L) 06/08/2020   K 5.5 (H) 06/08/2020   CL 99 06/08/2020   CREATININE 2.93 (H) 06/08/2020   BUN 28 (H) 06/08/2020   CO2 26 06/08/2020   TSH 2.338 04/21/2020

## 2020-11-15 ENCOUNTER — Encounter: Payer: Self-pay | Admitting: Physical Therapy

## 2020-11-15 ENCOUNTER — Other Ambulatory Visit: Payer: Self-pay | Admitting: Gynecologic Oncology

## 2020-11-15 ENCOUNTER — Ambulatory Visit: Admitting: Physical Therapy

## 2020-11-15 ENCOUNTER — Telehealth: Payer: Self-pay

## 2020-11-15 ENCOUNTER — Other Ambulatory Visit: Payer: Self-pay

## 2020-11-15 DIAGNOSIS — C519 Malignant neoplasm of vulva, unspecified: Secondary | ICD-10-CM

## 2020-11-15 DIAGNOSIS — M6281 Muscle weakness (generalized): Secondary | ICD-10-CM | POA: Diagnosis not present

## 2020-11-15 DIAGNOSIS — S31109S Unspecified open wound of abdominal wall, unspecified quadrant without penetration into peritoneal cavity, sequela: Secondary | ICD-10-CM

## 2020-11-15 DIAGNOSIS — R2689 Other abnormalities of gait and mobility: Secondary | ICD-10-CM

## 2020-11-15 NOTE — Therapy (Signed)
[place], [place], [address] Phone: [phone]   Fax:  [phone]  Physical Therapy Treatment  Patient Details  Name: Christina Mack MRN: [id number] Date of Birth: 1965-04-22 Referring Provider (PT): Courtney Heys, MD   Encounter Date: 11/15/2020   PT End of Session - 11/15/20 1026     Visit Number 4    Number of Visits 6    Date for PT Re-Evaluation 11/29/20    Authorization Type TRIC[employer]    PT Start Time 1000    PT Stop Time 1045    PT Time Calculation (min) 45 min    Activity Tolerance Patient tolerated treatment well    Behavior During Therapy [facility] for tasks assessed/performed             Past Medical History:  Diagnosis Date   Complication of anesthesia    nausea and vomiting   DVT (deep venous thrombosis) ([place])    History of osteomyelitis    Kidney transplant recipient    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[facility])     Past Surgical History:  Procedure Laterality Date   BIOPSY  04/24/2020   Procedure: BIOPSY;  Surgeon: Doran Stabler, MD;  Location: [place];  Service: Gastroenterology;;   ESOPHAGOGASTRODUODENOSCOPY (EGD) WITH PROPOFOL N/A 04/24/2020   Procedure: ESOPHAGOGASTRODUODENOSCOPY (EGD) WITH PROPOFOL;  Surgeon: Doran Stabler, MD;  Location: [place];  Service: Gastroenterology;  Laterality: N/A;   IR US GUIDE BX ASP/DRAIN  04/30/2020   KIDNEY TRANSPLANT  2001   KIDNEY TRANSPLANT  2014   LEEP     TOTAL ABDOMINAL HYSTERECTOMY     ovaries left in situ    There were no vitals filed for this visit.   Subjective Assessment - 11/15/20 1003     Subjective Patient reports she is doing well with no new issues. States exercises are going well.    Patient Stated Goals Walk without cane, squat to get stuff off floor or out of low cabinet, improve getting in/out of car    Currently in Pain? No/denies                [facility] PT Assessment - 11/15/20 0001       Strength   Right  Hip ABduction 3/5    Left Hip ABduction 3/5      Ambulation/Gait   Gait Comments Compensated trendelenburg on right                           [facility] Adult PT Treatment/Exercise - 11/15/20 0001       Exercises   Exercises Knee/Hip      Knee/Hip Exercises: Aerobic   Nustep L5 x 5 min with LE only while taking subjective      Knee/Hip Exercises: Machines for Strengthening   Total Gym Leg Press 35# 3 x 8      Knee/Hip Exercises: Standing   Heel Raises 2 sets;15 reps    Forward Step Up 2 sets;10 reps    Forward Step Up Limitations 6" step height    Other Standing Knee Exercises Hip hike on 4" with lateral step-up 2 x 10      Knee/Hip Exercises: Supine   Bridges 2 sets;10 reps   3 sets   Straight Leg Raises 2 sets;10 reps      Knee/Hip Exercises: Sidelying   Hip ABduction 2 sets;10 reps    Clams 2 x 15 with red  PT Education - 11/15/20 1026     Education Details HEP    Person(s) Educated Patient    Methods Explanation;Demonstration;Verbal cues    Comprehension Verbalized understanding;Returned demonstration;Verbal cues required;Need further instruction              PT Short Term Goals - 11/08/20 1008       PT SHORT TERM GOAL #1   Title Patient will be I with initial HEP to progress with PT    Time 3    Period Weeks    Status On-going    Target Date 11/08/20      PT SHORT TERM GOAL #2   Title PT will review FOTO with patient by 3rd visit to understand expected progress    Baseline reviewed 3rd visit    Time 3    Period Weeks    Status Achieved    Target Date 11/08/20               PT Long Term Goals - 10/18/20 0953       PT LONG TERM GOAL #1   Title Patient will be I with final HEP to maintain progress from PT    Time 6    Period Weeks    Status New    Target Date 11/29/20      PT LONG TERM GOAL #2   Title Patient will report >/= 62% functional status on FOTO to indicate improved functional  ability    Time 6    Period Weeks    Status New    Target Date 11/29/20      PT LONG TERM GOAL #3   Title Patient will demonsrate >/= 4-/5 MMT hip strength and >/= 4+/5 MMT knee strength to improve stair negotiation    Time 6    Period Weeks    Status New    Target Date 11/29/20      PT LONG TERM GOAL #4   Title Patient will demonstrate improved 5xSTS to </= 12 sec to reduce fall risk    Time 6    Period Weeks    Status New    Target Date 11/29/20      PT LONG TERM GOAL #5   Title Patient will demonstrate 6MWT >/= 1000 ft without AD to improve community access    Time 6    Period Weeks    Status New    Target Date 11/29/20                   Plan - 11/15/20 1027     Clinical Impression Statement Patient tolerated therapy well with no adverse effects. Therapy continued focus on progressing hip and LE strengthening. Patient able to progress resistance with hip strengthening and higher step heights. She continues to demonstrate hip strength deficit with gait deviations but is no longer using an assistive device and with better tolerance for ambulation. Incorporated leg press this visit with good tolerance. Patient would benefit from continued skilled PT to progress her mobility and strength in order to improve walking ability and maximize her functional mobility.    PT Treatment/Interventions ADLs/Self Care Home Management;Aquatic Therapy;Neuromuscular re-education;Balance training;Therapeutic exercise;Therapeutic activities;Functional mobility training;Stair training;Gait training;Patient/family education;Manual techniques;Passive range of motion;Energy conservation    PT Next Visit Plan Review HEP and progress PRN, progress generalized LE strength, step-ups/squats, balance training    PT Home Exercise Plan [id number]    Consulted and Agree with Plan of Care Patient  Patient will benefit from skilled therapeutic intervention in order to improve the following  deficits and impairments:  Abnormal gait, Difficulty walking, Decreased strength, Decreased activity tolerance, Decreased endurance, Decreased balance, Impaired flexibility  Visit Diagnosis: Muscle weakness (generalized)  Other abnormalities of gait and mobility     Problem List Patient Active Problem List   Diagnosis Date Noted   History of osteomyelitis 11/12/2020   Leg weakness, bilateral 09/15/2020   Screening for malignant neoplasm of skin 05/31/2020   Exposure to confirmed case of COVID-19 05/18/2020   Immunocompromised state due to drug therapy ([place]) 05/18/2020   Protein-calorie malnutrition, severe 05/14/2020   Chronic kidney disease (CKD), stage IV (severe) (HCC)    Chronic pain syndrome    Cancer associated pain    Pressure injury of skin 05/06/2020   Debility    Acute on chronic anemia    Thrombocytopenia ([place]) 04/26/2020   Esophageal candidiasis ([place]) 04/26/2020   Normocytic anemia 04/26/2020   CKD (chronic kidney disease) stage 4, GFR 15-29 ml/min ([place]) 04/26/2020   Wound of right groin 04/26/2020   Gastric ulcer without hemorrhage or perforation 04/26/2020   Malnutrition of moderate degree 04/26/2020   Acute osteomyelitis of left pelvic region and thigh ([place])    Pressure ulcer of coccygeal region, stage 2 ([place]) 04/21/2020   Weakness 04/20/2020   Acute deep vein thrombosis (DVT) of femoral vein ([place]) 12/05/2019   Iron deficiency anemia 11/10/2019   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([place]) 11/06/2019   Sprain of anterior talofibular ligament of right ankle 11/22/2016   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 [place], [place], [address] Phone: [phone]   Fax:  [phone]  Name: Christina Mack MRN: [id number] Date of Birth: Jan 27, 1966

## 2020-11-15 NOTE — Telephone Encounter (Signed)
Spoke with Christina Mack regarding her PET Scan. It has been scheduled for Thursday 8/25 at 3pm at [facility]. Patient is to arrive by 2:30pm and must be NPO 6 hours prior to the scan. Patient verbalizes understanding and is in agreement.

## 2020-11-17 ENCOUNTER — Other Ambulatory Visit: Payer: Self-pay

## 2020-11-17 ENCOUNTER — Ambulatory Visit (HOSPITAL_COMMUNITY)
Admission: RE | Admit: 2020-11-17 | Discharge: 2020-11-17 | Disposition: A | Discharge status: Home / Self Care | Source: Ambulatory Visit | Attending: Gynecologic Oncology | Admitting: Gynecologic Oncology

## 2020-11-17 ENCOUNTER — Telehealth: Payer: Self-pay

## 2020-11-17 DIAGNOSIS — Z8739 Personal history of other diseases of the musculoskeletal system and connective tissue: Secondary | ICD-10-CM | POA: Diagnosis present

## 2020-11-17 IMAGING — MR MR PELVIS W/O CM
7 of 8 series · 38 of 48 positions shown · non-contrast
Comparison: [DATE]

CLINICAL DATA: History of osteomyelitis. Patient presents for
re-evaluation.

EXAM:
MRI PELVIS WITHOUT CONTRAST
TECHNIQUE: Multiplanar multisequence MR imaging of the pelvis was performed. No
intravenous contrast was administered.

[Series 6: T2 · coronal · 6.0mm · 1.17mm/px · 4 of 26 slices shown (1 of 3)]
[im 1/26]
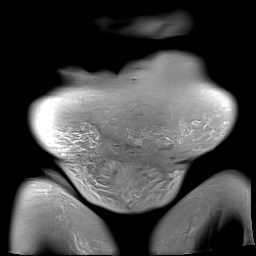
[im 9/26]
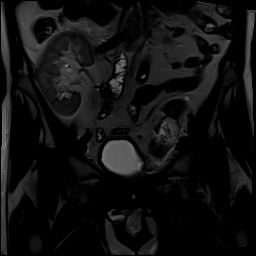
[im 17/26]
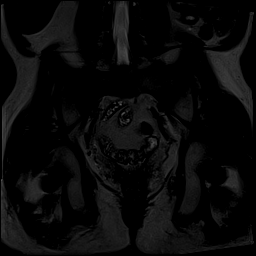
[im 26/26]
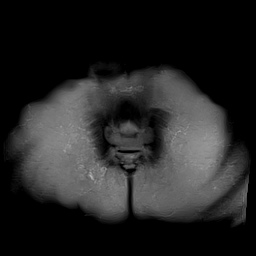

[Series 7: T1 · coronal · 6.0mm · 1.17mm/px · 4 of 26 slices shown (1 of 2)]
[im 1/26]
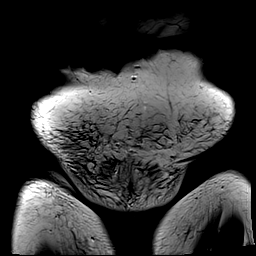
[im 9/26]
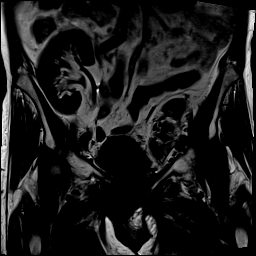
[im 17/26]
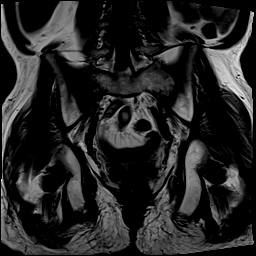
[im 26/26]
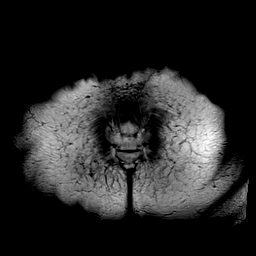

[Series 8: STIR · coronal · 6.0mm · 1.17mm/px · 4 of 26 slices shown]
[im 1/26]
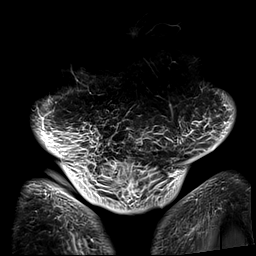
[im 9/26]
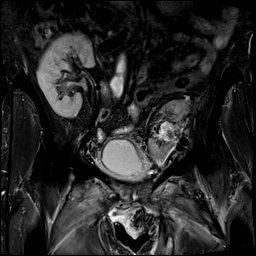
[im 17/26]
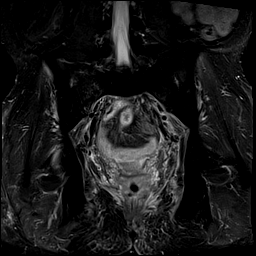
[im 26/26]
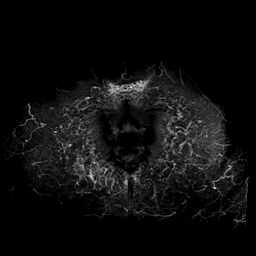

[Series 9: T1 · axial · 4.5mm · 0.94mm/px · z∈[-107,+118]mm · 7 of 41 slices shown (2 of 2)]
[im 1/41]
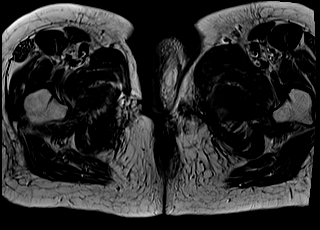
[im 7/41]
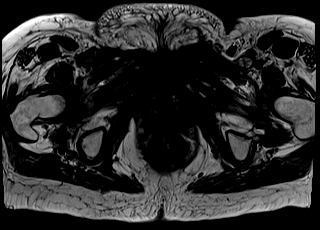
[im 14/41]
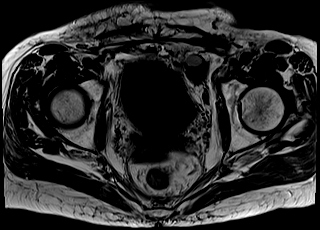
[im 21/41]
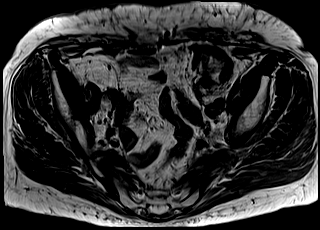
[im 27/41]
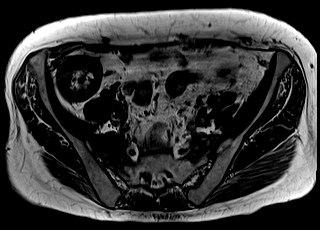
[im 34/41]
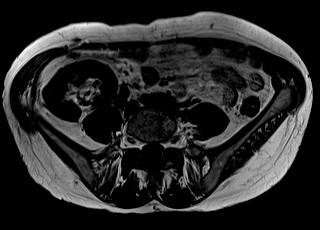
[im 41/41]
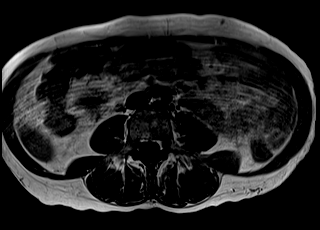

[Series 10: T2 fat-sat · sagittal · 5.5mm · 1.03mm/px · 8 of 46 slices shown]
[im 1/46]
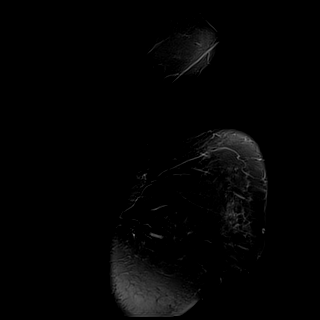
[im 7/46]
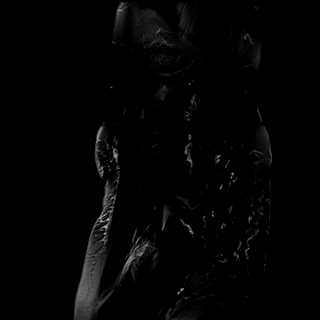
[im 13/46]
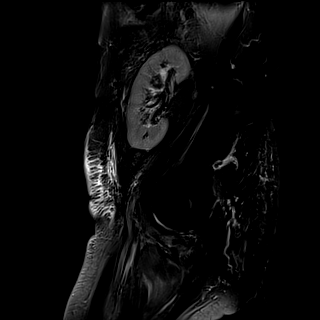
[im 20/46]
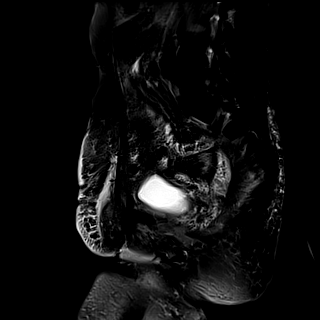
[im 26/46]
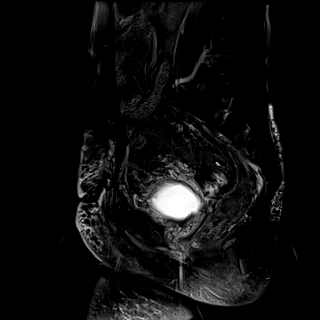
[im 33/46]
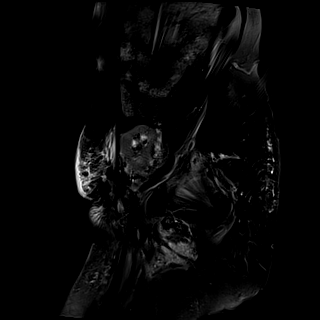
[im 39/46]
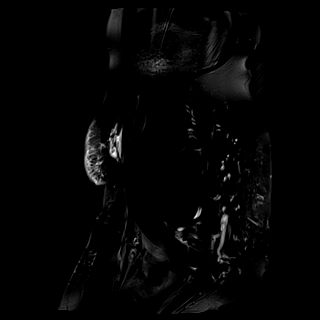
[im 46/46]
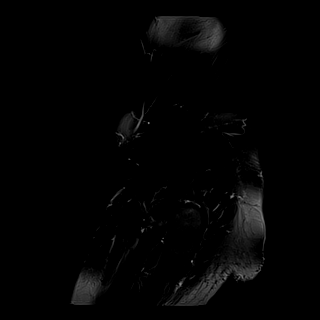

[Series 11: T2 · sagittal · 5.5mm · 0.55mm/px · 7 of 44 slices shown (2 of 3)]
[im 1/44]
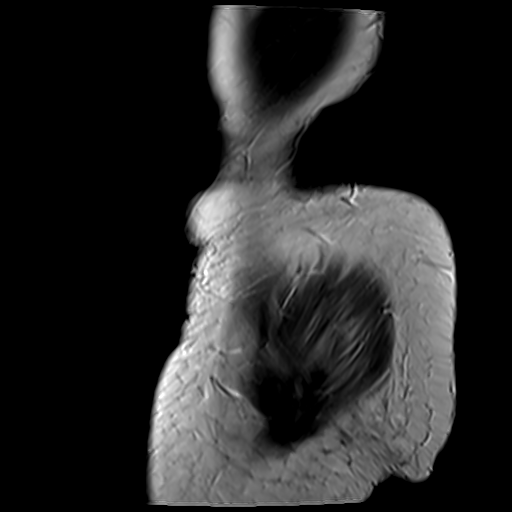
[im 8/44]
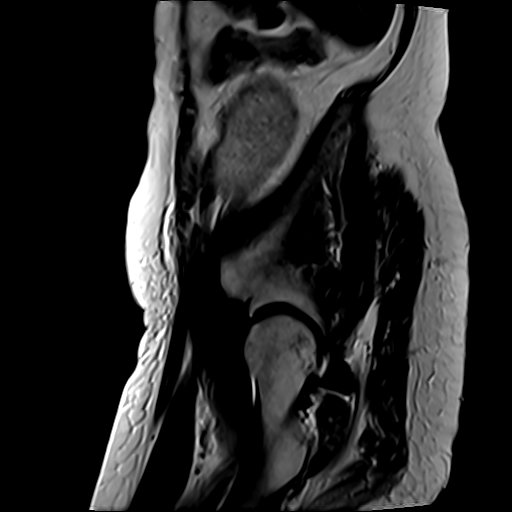
[im 15/44]
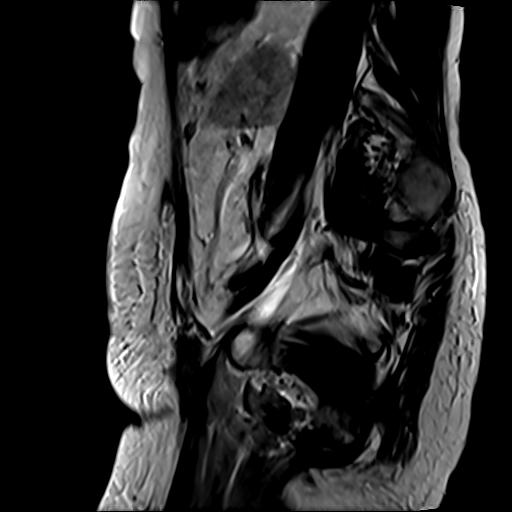
[im 22/44]
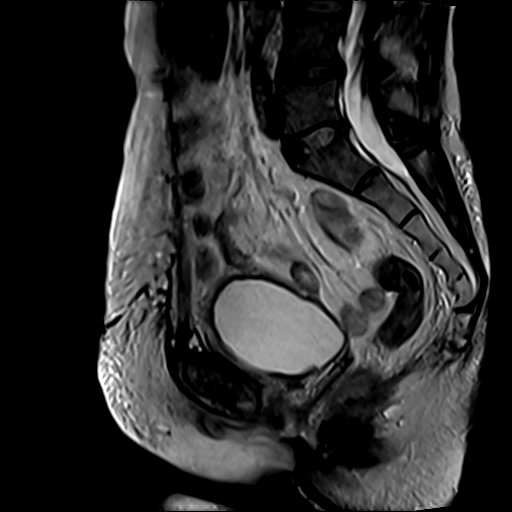
[im 29/44]
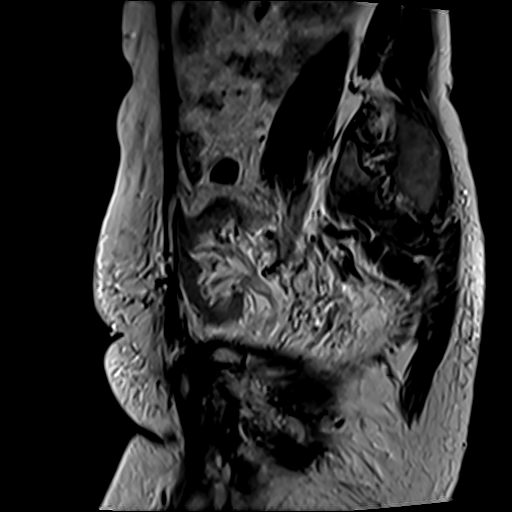
[im 36/44]
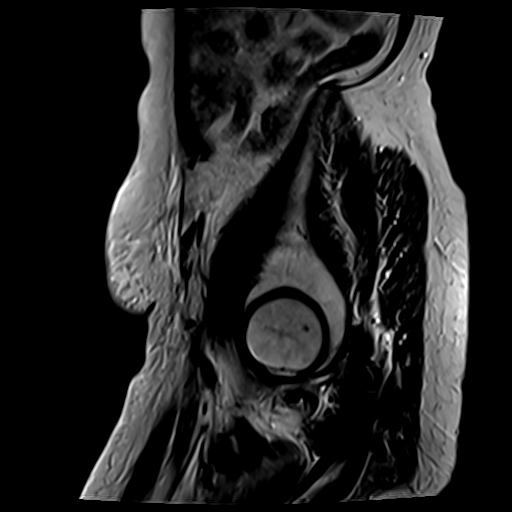
[im 44/44]
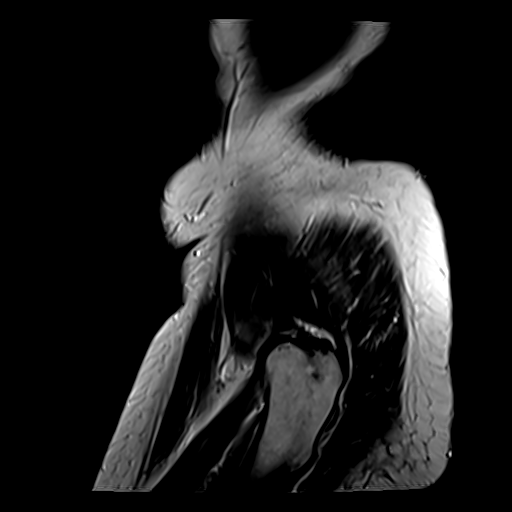

[Series 12: T2 · axial · 4.5mm · 0.94mm/px · z∈[-107,+6]mm · 4 of 41 slices shown (3 of 3)]
[im 1/41]
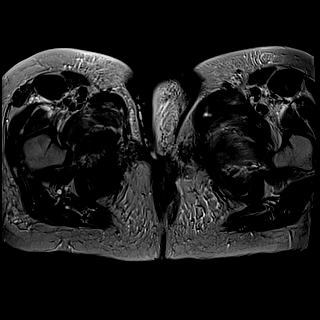
[im 7/41]
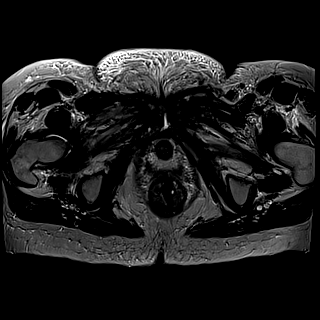
[im 14/41]
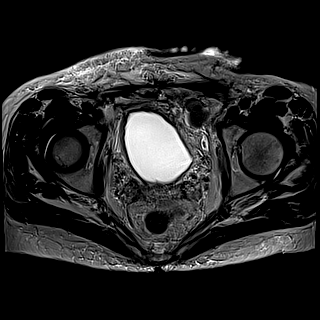
[im 21/41]
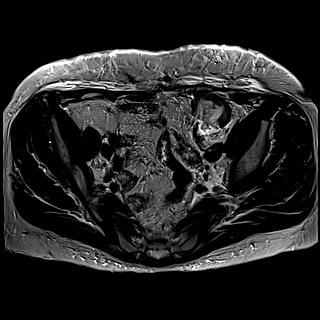

[38 of 48 positions shown; findings below may reference images not displayed]

FINDINGS: Bones:

No hip fracture, dislocation or avascular necrosis.

Large soft tissue wound in the right perineal region extending down
to the cortex of the right inferior pubic ramus. Severe bone marrow
edema and cortical destruction in the right inferior pubic ramus
extending into the pubic body. Small joint effusion of the pubic
symphysis with adjacent bone marrow edema in the left pubic body.

Bone marrow edema in the right and left side of the sacrum with a
linear low signal component consistent with a subacute nondisplaced
insufficiency fracture.

Articular cartilage and labrum

Articular cartilage:  No chondral defect.

Labrum: Grossly intact, but evaluation is limited by lack of
intraarticular fluid.

Joint or bursal effusion

Joint effusion:  No hip joint effusion.  No SI joint effusion.

Bursae: No bursa formation.

Muscles and tendons

Flexors: Normal.

Extensors: Normal.

Abductors: Normal.

Adductors: Mild edema in the adductor muscles bilaterally likely
reflecting persistent myositis, left greater than right. Interval
resolution of the complex fluid collections in the adductor
musculature.

Gluteals: Normal.

Hamstrings: Normal.

Other findings

No pelvic free fluid. No fluid collection or hematoma. No inguinal
lymphadenopathy. No inguinal hernia. Renal transplanted kidneys in
the right and left side of the pelvis.
IMPRESSION: 1. Large soft tissue wound in the right perineal region extending
down to the cortex of the right inferior pubic ramus. Severe bone
marrow edema and cortical destruction in the right inferior pubic
ramus extending into the pubic body. Small joint effusion of the
pubic symphysis with adjacent bone marrow edema in the left pubic
body. Findings are concerning for persistent septic arthritis of the
pubic symphysis with osteomyelitis of the right inferior pubic ramus
and pubic body. Interval resolution of the complex fluid collections
in the left adductor musculature, lower anterior abdominal wall and
posterior to the pubic symphysis.
2. Mild edema in the adductor muscles bilaterally likely reflecting
persistent myositis, left greater than right. Interval resolution of
the complex fluid collections in the adductor musculature.
3. Bilateral subacute sacral insufficiency fractures with associated
bone marrow edema.

## 2020-11-17 NOTE — Telephone Encounter (Signed)
Disability paperwork faxed to [place]. Confirmation received.

## 2020-11-22 ENCOUNTER — Encounter: Payer: Self-pay | Admitting: Physical Medicine and Rehabilitation

## 2020-11-22 ENCOUNTER — Encounter: Attending: Physical Medicine and Rehabilitation | Admitting: Physical Medicine and Rehabilitation

## 2020-11-22 ENCOUNTER — Other Ambulatory Visit: Payer: Self-pay

## 2020-11-22 VITALS — BP 125/82 | HR 95 | Temp 98.9°F | Ht 64.0 in | Wt 132.2 lb

## 2020-11-22 DIAGNOSIS — R29898 Other symptoms and signs involving the musculoskeletal system: Secondary | ICD-10-CM | POA: Diagnosis present

## 2020-11-22 DIAGNOSIS — M86152 Other acute osteomyelitis, left femur: Secondary | ICD-10-CM | POA: Diagnosis present

## 2020-11-22 DIAGNOSIS — R5381 Other malaise: Secondary | ICD-10-CM | POA: Diagnosis not present

## 2020-11-22 NOTE — Patient Instructions (Signed)
Pt is a 55 yr old female with vulvar cancer and osteomyelitis- s/p admission for debility-  Here for f/u on LE weakness and debility-  Has severe debility- slowing getting to point to walk again with quad cane-  Still needs additional PT to work on getting back to 5/5 in LE's- is ~ 4+/5 overall now- much better-  I think she needs another 8 weeks minimum- suggest 2x/week- if need additional certification, please fax to my office at [phone].    2.  Add walking on heels- daily- please hold on while doing it.   3.  F/U in 3 months- can d/c at that time if up to 5-/5 B/L in LE's.

## 2020-11-22 NOTE — Progress Notes (Signed)
Subjective:    Patient ID: Christina Mack, female    DOB: Aug 11, 1965, 55 y.o.   MRN: [id number]  HPI Pt is a 55 yr old female with vulvar cancer and osteomyelitis- s/p admission for debility-  Here for hospital follow up. Has severe debility- slowing getting to point to walk again with quad cane- Needs additional PT to get back to baseline- which was walking with no assistive device   Not walking with any assistive device- not needing it.   Now getting outpt PT- 1x/week- just recertified for 4 more weeks.   Still doing HEP 1x/day- 7 days/week.  Hasn't been getting into daughter's pool.   No issues  with puzzle books- soduku a lot- find a word, cross words;    MRI last Wednesday- "new" subacute pelvic insufficiency fractures- did fall in May 2022- putting something into car- foot got hung up on concrete pad and fell on bottom-  Cannot sit for too long- and cannot lift any weight.   Has appointment with wound care 9/16 for possible hyperbaric O2-  They are getting  her an appointment with ID due to concern for osteomyelitis still-   Pain not too bad- 3/10-  Has pain in ischial tuberosities B/L- "not enough padding on butt".   Also has myositis of adductors B/L .  On MRI    Pain Inventory Average Pain 3 Pain Right Now 0 My pain is intermittent, dull, and aching  In the last 24 hours, has pain interfered with the following? General activity 1 Relation with others 0 Enjoyment of life 0 What TIME of day is your pain at its worst? evening Sleep (in general) Good  Pain is worse with: sitting Pain improves with: rest Relief from Meds:  no pain taken  Family History  Problem Relation Age of Onset   Cerebral aneurysm Mother    AAA (abdominal aortic aneurysm) Brother    Breast cancer Paternal Aunt    Heart attack Maternal Grandmother    Brain cancer Maternal Grandfather    Colon cancer Neg Hx    Ovarian cancer Neg Hx    Endometrial cancer Neg Hx    Pancreatic cancer Neg  Hx    Prostate cancer Neg Hx    Social History   Socioeconomic History   Marital status: Married    Spouse name: Not on file   Number of children: Not on file   Years of education: Not on file   Highest education level: Not on file  Occupational History   Not on file  Tobacco Use   Smoking status: Never   Smokeless tobacco: Never  Vaping Use   Vaping Use: Never used  Substance and Sexual Activity   Alcohol use: Not Currently   Drug use: Never   Sexual activity: Not Currently  Other Topics Concern   Not on file  Social History Narrative   Not on file   Social Determinants of Health   Financial Resource Strain: Not on file  Food Insecurity: Not on file  Transportation Needs: Not on file  Physical Activity: Not on file  Stress: Not on file  Social Connections: Not on file   Past Surgical History:  Procedure Laterality Date   BIOPSY  04/24/2020   Procedure: BIOPSY;  Surgeon: Doran Stabler, MD;  Location: [place];  Service: Gastroenterology;;   ESOPHAGOGASTRODUODENOSCOPY (EGD) WITH PROPOFOL N/A 04/24/2020   Procedure: ESOPHAGOGASTRODUODENOSCOPY (EGD) WITH PROPOFOL;  Surgeon: Doran Stabler, MD;  Location: [place];  Service: Gastroenterology;  Laterality: N/A;   IR US GUIDE BX ASP/DRAIN  04/30/2020   KIDNEY TRANSPLANT  2001   KIDNEY TRANSPLANT  2014   LEEP     TOTAL ABDOMINAL HYSTERECTOMY     ovaries left in situ   Past Surgical History:  Procedure Laterality Date   BIOPSY  04/24/2020   Procedure: BIOPSY;  Surgeon: Doran Stabler, MD;  Location: [place];  Service: Gastroenterology;;   ESOPHAGOGASTRODUODENOSCOPY (EGD) WITH PROPOFOL N/A 04/24/2020   Procedure: ESOPHAGOGASTRODUODENOSCOPY (EGD) WITH PROPOFOL;  Surgeon: Doran Stabler, MD;  Location: [place];  Service: Gastroenterology;  Laterality: N/A;   IR US GUIDE BX ASP/DRAIN  04/30/2020   KIDNEY TRANSPLANT  2001   KIDNEY TRANSPLANT  2014   LEEP     TOTAL ABDOMINAL HYSTERECTOMY      ovaries left in situ   Past Medical History:  Diagnosis Date   Complication of anesthesia    nausea and vomiting   DVT (deep venous thrombosis) (HCC)    History of osteomyelitis    Kidney transplant recipient    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(HCC)    BP 125/82   Pulse 95   Temp 98.9 F (37.2 C)   Ht 5\' 4"  (1.626 m)   Wt 132 lb 3.2 oz (60 kg)   SpO2 97%   BMI 22.69 kg/m   Opioid Risk Score:   Fall Risk Score:  `1  Depression screen PHQ 2/9  Depression screen [facility] 2/9 09/15/2020 06/08/2020  Decreased Interest 0 0  Down, Depressed, Hopeless 0 0  PHQ - 2 Score 0 0  Altered sleeping 1 -  Tired, decreased energy 0 -  Change in appetite 0 -  Feeling bad or failure about yourself  0 -  Trouble concentrating 0 -  Moving slowly or fidgety/restless 0 -  Suicidal thoughts 0 -  PHQ-9 Score 1 -  Difficult doing work/chores Not difficult at all -    Review of Systems  Genitourinary:  Positive for pelvic pain.  All other systems reviewed and are negative.     Objective:   Physical Exam  Awake, alert, appropriate, no assistive device; not accompanied, NAD MS: HF 4+/5, KE 5-/5 B/L , KF 5-/5 B/L, DF 4+/5 on L; 4/5 on R; and PF 4+/5 B/L Hip adduction 4+/5 and abduction 5-/5      Assessment & Plan:   Pt is a 55 yr old female with vulvar cancer and osteomyelitis- s/p admission for debility-  Here for f/u on LE weakness and debility-  Has severe debility- slowing getting to point to walk again with quad cane-  Still needs additional PT to work on getting back to 5/5 in LE's- is ~ 4+/5 overall now- much better-  I think she needs another 8 weeks minimum- suggest 2x/week- if need additional certification, please fax to my office at [phone].    2.  Add walking on heels- daily- please hold on while doing it.   3.  F/U in 3 months- can d/c at that time if up to 5-/5 B/L in LE's.

## 2020-11-23 ENCOUNTER — Ambulatory Visit (INDEPENDENT_AMBULATORY_CARE_PROVIDER_SITE_OTHER): Payer: TRICARE For Life (TFL) | Admitting: Infectious Diseases

## 2020-11-23 ENCOUNTER — Other Ambulatory Visit: Payer: Self-pay

## 2020-11-23 VITALS — BP 132/89 | HR 95 | Temp 98.5°F | Ht 65.0 in | Wt 131.0 lb

## 2020-11-23 DIAGNOSIS — S31109S Unspecified open wound of abdominal wall, unspecified quadrant without penetration into peritoneal cavity, sequela: Secondary | ICD-10-CM

## 2020-11-23 DIAGNOSIS — M86152 Other acute osteomyelitis, left femur: Secondary | ICD-10-CM

## 2020-11-23 MED ORDER — AMOXICILLIN-POT CLAVULANATE 500-125 MG PO TABS: 1.0000 | ORAL_TABLET | Freq: Two times a day (BID) | ORAL | 0 refills | Status: AC

## 2020-11-23 NOTE — Assessment & Plan Note (Signed)
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   Kendyl has had interval improvement overall in her condition clinically with healthy recover weight regain, improvement in pain and some closure of her chronic wound.   Reassuring to see that the all of the complex fluid collections have all resolved and have not re-accumulated 3 months off antibiotics. No new findings of evolving areas. We discussed that often it is difficult to know how long abnormal radiographic findings to bone persist despite thorough treatment. She has completed a course of antibiotic treatment longer than standard of care given the un-drained fluid collections and had a tough time with nausea during that time frame for treatment.   Given her clinical improvement we are going to continue monitoring off antibiotics and see how she does. She may have a degree of chronic osteomyelitis which is difficult to completely cure without the mainstay of surgical bone debridement.  Will follow with her again in 2 months as she starts working with wound care and hyperbarics treatment center to see how she does. Certainly if her wound has difficulty healing or worsens would recommend getting cultures and re-treat. Will touch base with Dr. Heber Wendover in wound clinic before her next appointment with me.    IMPRESSION: 1. Large soft tissue wound in the right perineal region extending down to the cortex of the right inferior pubic ramus. Severe bone marrow edema and cortical destruction in the right inferior pubic ramus extending into the pubic body. Small joint effusion of the pubic symphysis with adjacent bone marrow edema in the left pubic body. 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 Interval resolution of the complex fluid  collections in the left adductor musculature, lower anterior abdominal wall and posterior to the pubic symphysis. 2. 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 Interval resolution of the complex fluid collections in the adductor musculature. 3. Bilateral subacute sacral insufficiency fractures with associated bone marrow edema.

## 2020-11-23 NOTE — Assessment & Plan Note (Signed)
Review of photos in Epic media indicate that her wound has closed up some since I last saw her. She does however report some new onset malodorous drainage to me today. Would like to err on the side of caution and give her a week of Augmentin for consideration of superficial wound infection

## 2020-11-23 NOTE — Patient Instructions (Addendum)
Nice to see you today.  Will plan a short course of Augmentin (1 tab twice a day) for the wound drainage. If you notice more bruising on your arms like before please let me know.  Start this after your PET scan is completed.   Will plan to see you back in 2 months. If you have worsening pain or trouble with the left hip please let me know.

## 2020-11-23 NOTE — Progress Notes (Signed)
Patient: Christina Mack  DOB: 1966-01-11 MRN: [id number] PCP: Norina Buzzard, MD     Subjective:  Christina Mack is a 55 y.o. 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 She is on chronic immunosuppression for kidney transplant.  Completed 8 weeks of tedezolid, and ultimately 12 weeks of metronidazole + levaquin back in May 2022. Since this time Demeisha has been off antibiotics and working with new PCP, GYN ONC team and rehab team.   Her weight has increased to 131 lb (up from 96 lb earlier this year). Her left hip pain is much better compared to our last follow up (3/10). She does recall she fell on her bottom at home in May 2022 getting out of car - she wonders if this caused her new subacute fractures mentioned on MRI recently. She does report increased drainage from her labial wound that smells like dirty socks and yellow/white in consistency. This is relatively new. Has been continuing to use absorbant dressing materials from home health team but is transitioning to [place] in a few weeks for consideration of hyperbaric oxygen therapy to the wound and pelvis.   She has a PET scan coming up Thursday of this week.    Review of Systems  Constitutional:  Negative for chills and fever.  HENT:  Negative for tinnitus.   Eyes:  Negative for blurred vision and photophobia.  Respiratory:  Negative for cough and sputum production.   Cardiovascular:  Negative for chest pain.  Gastrointestinal:  Negative for diarrhea, nausea and vomiting.  Genitourinary:  Negative for dysuria.  Musculoskeletal:  Positive for falls (May 2022 x 1) and joint pain (3/10 ischial/sacral pain). Negative for back pain and neck pain.  Skin:  Negative for rash.  Neurological:  Positive for weakness (improved significantly). Negative for headaches.    Past Medical History:  Diagnosis Date   Complication of anesthesia    nausea and vomiting    DVT (deep venous thrombosis) (HCC)    History of osteomyelitis    Kidney transplant recipient    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[facility])     Outpatient Medications Prior to Visit  Medication Sig Dispense Refill   apixaban (ELIQUIS) 2.5 MG TABS tablet TAKE 1 TABLET (2.5 MG TOTAL) BY MOUTH DAILY. 60 tablet 0   Cholecalciferol 125 MCG (5000 UT) capsule Take by mouth.     fluticasone (FLONASE) 50 MCG/ACT nasal spray Place 1 spray into both nostrils daily as needed.     pantoprazole (PROTONIX) 40 MG tablet TAKE 1 TABLET (40 MG TOTAL) BY MOUTH 2 (TWO) TIMES DAILY BEFORE A MEAL. 60 tablet 0   predniSONE (DELTASONE) 5 MG tablet Take 5 mg by mouth daily with breakfast.     PROGRAF 1 MG capsule Take 1 mg by mouth 2 (two) times daily. 2mg  in morning 1mg  at night     acetaminophen (TYLENOL) 500 MG tablet Take 2 tablets (1,000 mg total) by mouth 3 (three) times daily. (Patient not taking: No sig reported) 30 tablet 0   No facility-administered medications prior to visit.     Allergies  Allergen Reactions   Oxycodone-Acetaminophen Itching, Nausea Only, Other (See Comments), Nausea And Vomiting and Rash    Other Reaction: HA Other Reaction: HA    Zosyn [Piperacillin Sod-Tazobactam So] Other (See Comments)    Thrombocytopenia   Hydromorphone Nausea And Vomiting, Rash and Other (See Comments)   Amlodipine Other (See Comments)  Ankle swelling   Codeine     vomiting    Social History   Tobacco Use   Smoking status: Never   Smokeless tobacco: Never  Vaping Use   Vaping Use: Never used  Substance Use Topics   Alcohol use: Not Currently   Drug use: Never    Family History  Problem Relation Age of Onset   Cerebral aneurysm Mother    AAA (abdominal aortic aneurysm) Brother    Breast cancer Paternal Aunt    Heart attack Maternal Grandmother    Brain cancer Maternal Grandfather    Colon cancer Neg Hx    Ovarian cancer Neg Hx    Endometrial cancer Neg Hx    Pancreatic cancer Neg Hx    Prostate cancer  Neg Hx     Objective:   Vitals:   11/23/20 1014  BP: 132/89  Pulse: 95  Temp: 98.5 F (36.9 C)  TempSrc: Oral  SpO2: 100%  Weight: 131 lb (59.4 kg)  Height: 5\' 5"  (1.651 m)   Body mass index is 21.8 kg/m.  Physical Exam Vitals reviewed.  Constitutional:      Appearance: Normal appearance. She is not ill-appearing.     Comments: She has had regain of healthy weight/body mass. She is seated comfortably in chair today and in no distress.   HENT:     Mouth/Throat:     Mouth: Mucous membranes are moist.     Pharynx: Oropharynx is clear.  Eyes:     General: No scleral icterus. Pulmonary:     Effort: Pulmonary effort is normal.  Abdominal:     General: There is no distension.  Genitourinary:    Comments: Politely declined wound eval today.  Musculoskeletal:        General: No swelling.  Skin:    Capillary Refill: Capillary refill takes less than 2 seconds.     Coloration: Skin is not jaundiced.     Findings: No rash (Few scattered bruises on arms).  Neurological:     Mental Status: She is oriented to person, place, and time.  Psychiatric:        Mood and Affect: Mood normal.        Thought Content: Thought content normal.    Lab Results: Lab Results  Component Value Date   WBC 6.7 06/08/2020   HGB 7.8 (L) 06/08/2020   HCT 23.8 (L) 06/08/2020   MCV 90.2 06/08/2020   PLT 177 06/08/2020    Lab Results  Component Value Date   CREATININE 2.93 (H) 06/08/2020   BUN 28 (H) 06/08/2020   NA 133 (L) 06/08/2020   K 5.5 (H) 06/08/2020   CL 99 06/08/2020   CO2 26 06/08/2020    Lab Results  Component Value Date   ALT 12 05/06/2020   AST 17 05/06/2020   ALKPHOS 213 (H) 05/06/2020   BILITOT 0.8 05/06/2020     Assessment & Plan:   Problem List Items Addressed This Visit       Unprioritized   Wound of right groin - Primary    Review of photos in Epic media indicate that her wound has closed up some since I last saw her. She does however report some new onset  malodorous drainage to me today. 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   Christina Mack has had interval improvement overall in her condition clinically with healthy recover weight regain, improvement in pain and some closure of her chronic wound.   Reassuring to see that the all of the complex fluid collections have all resolved and have not re-accumulated 3 months off antibiotics. No new findings of evolving areas. We discussed that often it is difficult to know how long abnormal radiographic findings to bone persist despite thorough treatment. She has completed a course of antibiotic treatment longer than standard of care given the un-drained fluid collections and had a tough time with nausea during that time frame for treatment.   Given her clinical improvement we are going to continue monitoring off antibiotics and see how she does. She may have a degree of chronic osteomyelitis which is difficult to completely cure without the mainstay of surgical bone debridement.  Will follow with her again in 2 months as she starts working with wound care and hyperbarics treatment center to see how she does. Certainly if her wound has difficulty healing or worsens would recommend getting cultures and re-treat. Will touch base with Dr. Heber Clifton in wound clinic before her next appointment with me.    IMPRESSION: 1. Large soft tissue wound in the right perineal region extending down to the cortex of the right inferior pubic ramus. Severe bone marrow edema and cortical destruction in the right inferior pubic ramus extending into the pubic body. Small joint effusion of the pubic symphysis with adjacent bone  marrow edema in the left pubic body. 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 Interval resolution of the complex fluid collections in the left adductor musculature, lower anterior abdominal wall and posterior to the pubic symphysis. 2. 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 Interval resolution of the complex fluid collections in the adductor musculature. 3. Bilateral subacute sacral insufficiency fractures with associated bone marrow edema.       [name], MSN, NP-C [facility] [place] Pager: [phone] Office: [phone]  11/23/20  2:12 PM

## 2020-11-25 ENCOUNTER — Encounter: Payer: Self-pay | Admitting: Physical Therapy

## 2020-11-25 ENCOUNTER — Encounter (HOSPITAL_COMMUNITY)
Admission: RE | Admit: 2020-11-25 | Discharge: 2020-11-25 | Disposition: A | Discharge status: Home / Self Care | Source: Ambulatory Visit | Attending: Gynecologic Oncology | Admitting: Gynecologic Oncology

## 2020-11-25 ENCOUNTER — Ambulatory Visit: Admitting: Physical Therapy

## 2020-11-25 ENCOUNTER — Other Ambulatory Visit: Payer: Self-pay

## 2020-11-25 DIAGNOSIS — M6281 Muscle weakness (generalized): Secondary | ICD-10-CM

## 2020-11-25 DIAGNOSIS — C519 Malignant neoplasm of vulva, unspecified: Secondary | ICD-10-CM | POA: Insufficient documentation

## 2020-11-25 DIAGNOSIS — R2689 Other abnormalities of gait and mobility: Secondary | ICD-10-CM

## 2020-11-25 DIAGNOSIS — S31109S Unspecified open wound of abdominal wall, unspecified quadrant without penetration into peritoneal cavity, sequela: Secondary | ICD-10-CM | POA: Diagnosis present

## 2020-11-25 LAB — GLUCOSE, CAPILLARY: Glucose-Capillary: 143 mg/dL — ABNORMAL HIGH (ref 70–99)

## 2020-11-25 IMAGING — PT NM PET TUM IMG INITIAL (PI) SKULL BASE T - THIGH
1 of 9 series · 1 of 25 positions shown · non-contrast
Comparison: [REDACTED] [DATE] exam.

CLINICAL DATA: Subsequent treatment strategy for genital cancer of
the vulva.

EXAM:
NUCLEAR MEDICINE PET SKULL BASE TO THIGH
TECHNIQUE: 6.4 mCi F-18 FDG was injected intravenously. Full-ring PET imaging
was performed from the skull base to thigh after the radiotracer. CT
data was obtained and used for attenuation correction and anatomic
localization.
Fasting blood glucose: 143 mg/dl

[Series 3: pet sk_thigh ac · axial · 5.0mm · 4.07mm/px · 1 of 215 slices shown]
[im 143/215]
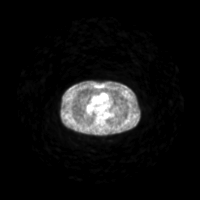

[1 of 25 positions shown; findings below may reference images not displayed]

Report for that exam
documented large hypermetabolic lesion in the right perineum with
hypermetabolic right groin lymphadenopathy. Tiny right apical nodule
also noted.
FINDINGS: Mediastinal blood pool activity: SUV max

Liver activity: SUV max NA

NECK: No hypermetabolic lymph nodes in the neck.

Incidental CT findings: none

CHEST: No hypermetabolic mediastinal or hilar nodes. No suspicious
pulmonary nodules on the CT scan.

Incidental CT findings: Coronary artery calcification is evident.
Stable 3 mm right apical nodule on image [DATE]. No new suspicious
pulmonary nodule or mass.

ABDOMEN/PELVIS: No abnormal hypermetabolic activity within the
liver, pancreas, adrenal glands, or spleen. No hypermetabolic lymph
nodes in the abdomen.

Overall decrease in hypermetabolism associated with the right
perineum although soft tissue defect is more pronounced in this
region today. There is a focal area of hypermetabolism at the
symphysis pubis, involving in just inferior to the right pubic bone
with SUV max = 6.2. Hypermetabolism along the posterior aspect of
the wound tracking the ischial tuberosity is SUV max = 4.3. Intense
hypermetabolism is seen in the medial aspect of the adductor muscles
of the left thigh SUV max = 8.5 with asymmetric enlargement of the
muscles in the adductor compartment and ill definition of
intermuscular fat planes.

Incidental CT findings: Native kidneys are atrophic. Transplant
kidney noted in each hemipelvis. There is mild atherosclerotic
calcification of the abdominal aorta without aneurysm.

SKELETON: Activity associated with the anterior right pubic bone and
right ischial tuberosity as above. Otherwise no suspicious
hypermetabolic bone lesion evident.

Incidental CT findings: Bony erosion noted in the pubic bones
bilaterally and involving the right inferior pubic ramus and
inferior right ischial tuberosity.
IMPRESSION: 1. No evidence for hypermetabolic metastatic disease in the neck,
chest, or abdomen.
2. The hypermetabolic right groin lymphadenopathy seen previously
has resolved in the interval.
3. Generalized decrease in hypermetabolism associated with the large
right perineal lesion although the soft tissue defect/wound is more
prominent on CT imaging today. There are areas of persistent
hypermetabolism in the region of the right pubic bone and to a
lesser degree involving the right ischial tuberosity. Intense
hypermetabolic activity is identified in the adductor muscle
compartment of the left thigh without a discrete mass lesion evident
on noncontrast CT imaging. MRI of the pelvis performed on [DATE]
documented edema in the adductor muscles bilaterally suggesting
persistent myositis, left greater than right.

## 2020-11-25 NOTE — Therapy (Signed)
[facility] [place], [place], [address] Phone: [phone]   Fax:  [phone]  Physical Therapy Treatment / ERO  Patient Details  Name: Christina Mack MRN: [id number] Date of Birth: 1965-06-20 Referring Provider (PT): Courtney Heys, MD   Encounter Date: 11/25/2020   PT End of Session - 11/25/20 1007     Visit Number 5    Number of Visits 21    Date for PT Re-Evaluation 01/20/21    Authorization Type TRICARE    PT Start Time 1000    PT Stop Time 1035    PT Time Calculation (min) 35 min    Activity Tolerance Patient tolerated treatment well    Behavior During Therapy [facility] for tasks assessed/performed             Past Medical History:  Diagnosis Date   Complication of anesthesia    nausea and vomiting   DVT (deep venous thrombosis) ([place])    History of osteomyelitis    Kidney transplant recipient    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[facility])     Past Surgical History:  Procedure Laterality Date   BIOPSY  04/24/2020   Procedure: BIOPSY;  Surgeon: Doran Stabler, MD;  Location: [place];  Service: Gastroenterology;;   ESOPHAGOGASTRODUODENOSCOPY (EGD) WITH PROPOFOL N/A 04/24/2020   Procedure: ESOPHAGOGASTRODUODENOSCOPY (EGD) WITH PROPOFOL;  Surgeon: Doran Stabler, MD;  Location: [place];  Service: Gastroenterology;  Laterality: N/A;   IR US GUIDE BX ASP/DRAIN  04/30/2020   KIDNEY TRANSPLANT  2001   KIDNEY TRANSPLANT  2014   LEEP     TOTAL ABDOMINAL HYSTERECTOMY     ovaries left in situ    There were no vitals filed for this visit.   Subjective Assessment - 11/25/20 1004     Subjective Patient reports she is feeling a little sore today because she has been lifting some stuff. She did have an MRI some pelvic fractures and she feels like this is why she has more pain when she sits or lifts. Patient saw her doctor and requests to be extended with therapy for 2x/week for 8 weeks.    Limitations  Lifting;Standing;Walking;House hold activities    Patient Stated Goals Walk without cane, squat to get stuff off floor or out of low cabinet, improve getting in/out of car    Currently in Pain? Yes    Pain Score 1     Pain Location Pelvis    Pain Descriptors / Indicators Sore    Pain Type Chronic pain    Pain Onset More than a month ago    Pain Frequency Intermittent    Aggravating Factors  Lifting, sitting    Pain Relieving Factors Rest, change of position                [facility] PT Assessment - 11/25/20 0001       Assessment   Medical Diagnosis Debility    Referring Provider (PT) Lovorn, Megan, MD      Precautions   Precautions Fall      Restrictions   Weight Bearing Restrictions No      Balance Screen   Has the patient fallen in the past 6 months No    How many times? No falls since start of therapy      Prior Function   Level of Independence Independent      Cognition   Overall Cognitive Status Within Functional Limits for tasks assessed  Observation/Other Assessments   Observations Patient appears in no apparent distress    Focus on Therapeutic Outcomes (FOTO)  58% functional status      Strength   Right Hip Flexion 4/5    Right Hip Extension 3-/5    Right Hip ABduction 3+/5    Left Hip Flexion 4-/5    Left Hip Extension 3-/5    Left Hip ABduction 3+/5    Right Knee Flexion 4/5    Right Knee Extension 4/5    Left Knee Flexion 4/5    Left Knee Extension 4/5    Right Ankle Dorsiflexion 4+/5    Left Ankle Dorsiflexion 4/5      Ambulation/Gait   Ambulation/Gait Yes    Ambulation/Gait Assistance 7: Independent    Gait Comments Compensated trendelenburg on right      6 minute walk test results    Endurance additional comments Unable to assess      Standardized Balance Assessment   Five times sit to stand comments  12 seconds without UE support                           [facility] Adult PT Treatment/Exercise - 11/25/20 0001        Self-Care   Self-Care Other Self-Care Comments    Other Self-Care Comments  POC update, FOTO, exam findings and progress toward goals, HEP      Exercises   Exercises Knee/Hip      Knee/Hip Exercises: Stretches   Hip Flexor Stretch 2 reps;30 seconds    Hip Flexor Stretch Limitations modified thomas      Knee/Hip Exercises: Aerobic   Nustep L5 x 5 min with LE only while taking subjective      Knee/Hip Exercises: Supine   Bridges 2 sets;10 reps      Knee/Hip Exercises: Sidelying   Hip ABduction 10 reps                    PT Education - 11/25/20 1039     Education Details POC update, FOTO, exam findings and progress toward goals, HEP    Person(s) Educated Patient    Methods Explanation;Demonstration;Verbal cues    Comprehension Verbalized understanding;Returned demonstration;Verbal cues required;Need further instruction              PT Short Term Goals - 11/25/20 1017       PT SHORT TERM GOAL #1   Title Patient will be I with initial HEP to progress with PT    Time 3    Period Weeks    Status Achieved    Target Date 11/08/20      PT SHORT TERM GOAL #2   Title PT will review FOTO with patient by 3rd visit to understand expected progress    Baseline reviewed 3rd visit    Time 3    Period Weeks    Status Achieved    Target Date 11/08/20               PT Long Term Goals - 11/25/20 1017       PT LONG TERM GOAL #1   Title Patient will be I with final HEP to maintain progress from PT    Baseline progressing with HEP    Time 8    Period Weeks    Status On-going    Target Date 01/20/21      PT LONG TERM GOAL #2   Title Patient will  report >/= 62% functional status on FOTO to indicate improved functional ability    Baseline 58% functional status    Time 8    Period Weeks    Status On-going    Target Date 01/20/21      PT LONG TERM GOAL #3   Title Patient will demonsrate >/= 4-/5 MMT hip strength and >/= 4+/5 MMT knee strength to improve  stair negotiation    Baseline continues to demonstrate gross weakness    Time 8    Period Weeks    Status On-going    Target Date 01/20/21      PT LONG TERM GOAL #4   Title Patient will demonstrate improved 5xSTS to </= 12 sec to reduce fall risk    Baseline 12 seconds    Time 6    Period Weeks    Status Achieved      PT LONG TERM GOAL #5   Title Patient will demonstrate 6MWT >/= 1000 ft without AD to improve community access    Baseline unable to perform due to onset of pelvic/groin pain    Time 8    Period Weeks    Status Unable to assess    Target Date 01/20/21                   Plan - 11/25/20 1009     Clinical Impression Statement Patient is progressing well towards goal, demonstrating improved functional level, strength, and ambulatory ability. She does continue to exhibit gross hip, knee, and ankle strength but has improved since evaluation. She is ambulating without assistive device but does continue to exhibit deviations likely secondary to weakness. She has achieved her 5xSTS goal indicating improved strength and reduced risk of falls. Therapy was limited this visit due to patient experiencing pelvic/groin pain from wound when beginning to attempt 6MWT so that was not completed and patient was unable to continue with therapy so ended early. Patient would benefit from continued skilled PT to progress her mobility and strength in order to improve walking ability and maximize her functional mobility.    Rehab Potential Good    PT Frequency 2x / week    PT Duration 8 weeks    PT Treatment/Interventions ADLs/Self Care Home Management;Aquatic Therapy;Neuromuscular re-education;Balance training;Therapeutic exercise;Therapeutic activities;Functional mobility training;Stair training;Gait training;Patient/family education;Manual techniques;Passive range of motion;Energy conservation    PT Next Visit Plan Review HEP and progress PRN, progress generalized LE strength,  step-ups/squats, balance training    PT Home Exercise Plan [email or web address]    Consulted and Agree with Plan of Care Patient             Patient will benefit from skilled therapeutic intervention in order to improve the following deficits and impairments:  Abnormal gait, Difficulty walking, Decreased strength, Decreased activity tolerance, Decreased endurance, Decreased balance, Impaired flexibility  Visit Diagnosis: Muscle weakness (generalized)  Other abnormalities of gait and mobility     Problem List Patient Active Problem List   Diagnosis Date Noted   History of osteomyelitis 11/12/2020   Leg weakness, bilateral 09/15/2020   Screening for malignant neoplasm of skin 05/31/2020   Exposure to confirmed case of COVID-19 05/18/2020   Immunocompromised state due to drug therapy ([place]) 05/18/2020   Protein-calorie malnutrition, severe 05/14/2020   Chronic kidney disease (CKD), stage IV (severe) (HCC)    Chronic pain syndrome    Cancer associated pain    Pressure injury of skin 05/06/2020   Debility    Acute on chronic anemia  Thrombocytopenia ([place]) 04/26/2020   Esophageal candidiasis ([place]) 04/26/2020   Normocytic anemia 04/26/2020   CKD (chronic kidney disease) stage 4, GFR 15-29 ml/min (HCC) 04/26/2020   Wound of right groin 04/26/2020   Gastric ulcer without hemorrhage or perforation 04/26/2020   Malnutrition of moderate degree 04/26/2020   Acute osteomyelitis of left pelvic region and thigh ([place])    Pressure ulcer of coccygeal region, stage 2 ([place]) 04/21/2020   Weakness 04/20/2020   Acute deep vein thrombosis (DVT) of femoral vein ([place]) 12/05/2019   Iron deficiency anemia 11/10/2019   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([place]) 11/06/2019   Sprain of anterior talofibular ligament of right ankle 11/22/2016   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 [place], [place], [address] Phone: [phone]   Fax:  [phone]  Name: Christina Mack MRN: [id number] Date of Birth: 03-Jul-1965

## 2020-11-25 NOTE — Patient Instructions (Signed)
Access Code: [email or web address] URL: [email or web address] Date: 11/25/2020 Prepared by: Hilda Blades  Exercises Modified Marcello Moores Stretch - 1-2 x daily - 7 x weekly - 2 reps - 60 hold Bridge - 1 x daily - 5 x weekly - 3 sets - 10 reps - 3 hold Active Straight Leg Raise with Quad Set - 1 x daily - 7 x weekly - 3 sets - 10 reps Sidelying Hip Abduction - 1 x daily - 5 x weekly - 3 sets - 10 reps Seated Knee Extension with Resistance - 1 x daily - 5 x weekly - 3 sets - 10 reps Seated Hamstring Curls with Resistance - 1 x daily - 5 x weekly - 3 sets - 10 reps Sit to Stand - 1 x daily - 5 x weekly - 3 sets - 10 reps Partial Lunge with Chair - 1 x daily - 5 x weekly - 2 sets - 8 reps Standing Hip Extension on Swiss Ball - 1 x daily - 7 x weekly - 2 sets - 10 reps Step Up - 1 x daily - 7 x weekly - 2 sets - 10 reps Heel Toe Raises with Counter Support - 1 x daily - 7 x weekly - 2 sets - 10 reps Standing Tandem Balance with Counter Support - 1 x daily - 7 x weekly - 3 sets - 30 hold

## 2020-11-30 ENCOUNTER — Encounter: Payer: Self-pay | Admitting: Physical Therapy

## 2020-11-30 ENCOUNTER — Other Ambulatory Visit: Payer: Self-pay

## 2020-11-30 ENCOUNTER — Ambulatory Visit: Admitting: Physical Therapy

## 2020-11-30 DIAGNOSIS — R2689 Other abnormalities of gait and mobility: Secondary | ICD-10-CM

## 2020-11-30 DIAGNOSIS — M6281 Muscle weakness (generalized): Secondary | ICD-10-CM | POA: Diagnosis not present

## 2020-11-30 NOTE — Therapy (Signed)
[place], [place], [address] Phone: [phone]   Fax:  [phone]  Physical Therapy Treatment  Patient Details  Name: Christina Mack MRN: [id number] Date of Birth: 1965-10-06 Referring Provider (PT): Courtney Heys, MD   Encounter Date: 11/30/2020   PT End of Session - 11/30/20 1139     Visit Number 6    Number of Visits 21    Date for PT Re-Evaluation 01/20/21    Authorization Type [employer]IC[employer]    PT Start Time 1131    PT Stop Time 1205    PT Time Calculation (min) 34 min    Activity Tolerance Patient tolerated treatment well    Behavior During Therapy [facility] for tasks assessed/performed             Past Medical History:  Diagnosis Date   Complication of anesthesia    nausea and vomiting   DVT (deep venous thrombosis) ([place])    History of osteomyelitis    Kidney transplant recipient    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[facility])     Past Surgical History:  Procedure Laterality Date   BIOPSY  04/24/2020   Procedure: BIOPSY;  Surgeon: Doran Stabler, MD;  Location: [place];  Service: Gastroenterology;;   ESOPHAGOGASTRODUODENOSCOPY (EGD) WITH PROPOFOL N/A 04/24/2020   Procedure: ESOPHAGOGASTRODUODENOSCOPY (EGD) WITH PROPOFOL;  Surgeon: Doran Stabler, MD;  Location: [place];  Service: Gastroenterology;  Laterality: N/A;   IR US GUIDE BX ASP/DRAIN  04/30/2020   KIDNEY TRANSPLANT  2001   KIDNEY TRANSPLANT  2014   LEEP     TOTAL ABDOMINAL HYSTERECTOMY     ovaries left in situ    There were no vitals filed for this visit.   Subjective Assessment - 11/30/20 1136     Subjective Patient reports she has continued to have pain since last visit. She believes the hip flexor stretch last visit may have aggravated the left groin and then since last visit she feels she has compensated so now the right hip is sore.    Patient Stated Goals Walk without cane, squat to get stuff off floor or out of low cabinet,  improve getting in/out of car    Currently in Pain? Yes    Pain Score 2     Pain Location Groin    Pain Orientation Left    Pain Descriptors / Indicators Sore;Sharp    Pain Type Chronic pain    Pain Onset More than a month ago    Pain Frequency Intermittent                               [facility] Adult PT Treatment/Exercise - 11/30/20 0001       Knee/Hip Exercises: Aerobic   Nustep L5 x 5 min with LE only while taking subjective      Knee/Hip Exercises: Supine   Short Arc Quad Sets 2 sets;15 reps    Short Arc Quad Sets Limitations 2#    Bridges 2 sets;10 reps    Bridges Limitations partial range    Patellar Mobs Clamshell with yellow unilateral 2 x 10 each    Other Supine Knee/Hip Exercises Bent knee fall out unilateral 2 x 10 each    Other Supine Knee/Hip Exercises Adductor isometric ball squeeze 2 x10                    PT Education - 11/30/20 1138  Education Details HEP    Person(s) Educated Patient    Methods Explanation;Demonstration;Verbal cues    Comprehension Verbalized understanding;Returned demonstration;Verbal cues required;Need further instruction              PT Short Term Goals - 11/25/20 1017       PT SHORT TERM GOAL #1   Title Patient will be I with initial HEP to progress with PT    Time 3    Period Weeks    Status Achieved    Target Date 11/08/20      PT SHORT TERM GOAL #2   Title PT will review FOTO with patient by 3rd visit to understand expected progress    Baseline reviewed 3rd visit    Time 3    Period Weeks    Status Achieved    Target Date 11/08/20               PT Long Term Goals - 11/25/20 1017       PT LONG TERM GOAL #1   Title Patient will be I with final HEP to maintain progress from PT    Baseline progressing with HEP    Time 8    Period Weeks    Status On-going    Target Date 01/20/21      PT LONG TERM GOAL #2   Title Patient will report >/= 62% functional status on FOTO to  indicate improved functional ability    Baseline 58% functional status    Time 8    Period Weeks    Status On-going    Target Date 01/20/21      PT LONG TERM GOAL #3   Title Patient will demonsrate >/= 4-/5 MMT hip strength and >/= 4+/5 MMT knee strength to improve stair negotiation    Baseline continues to demonstrate gross weakness    Time 8    Period Weeks    Status On-going    Target Date 01/20/21      PT LONG TERM GOAL #4   Title Patient will demonstrate improved 5xSTS to </= 12 sec to reduce fall risk    Baseline 12 seconds    Time 6    Period Weeks    Status Achieved      PT LONG TERM GOAL #5   Title Patient will demonstrate 6MWT >/= 1000 ft without AD to improve community access    Baseline unable to perform due to onset of pelvic/groin pain    Time 8    Period Weeks    Status Unable to assess    Target Date 01/20/21                   Plan - 11/30/20 1142     Clinical Impression Statement Patient limited with therapy secondary to groin/pelvic pain. Therapy primarily focused on continued gentle strengthening to avoid aggravating left groin. She was regressed with her exercises due to pain but able to tolerate the exercises well in therapy. Patient was instructed on modifying her HEP to avoid further exacerbation of symptoms but continue with consistent activity. Patient would benefit from continued skilled PT to progress her mobility and strength in order to improve walking ability and maximize her functional mobility.    PT Treatment/Interventions ADLs/Self Care Home Management;Aquatic Therapy;Neuromuscular re-education;Balance training;Therapeutic exercise;Therapeutic activities;Functional mobility training;Stair training;Gait training;Patient/family education;Manual techniques;Passive range of motion;Energy conservation    PT Next Visit Plan Review HEP and progress PRN, progress generalized LE strength, step-ups/squats, balance training  PT Home Exercise Plan  [id number]    Consulted and Agree with Plan of Care Patient             Patient will benefit from skilled therapeutic intervention in order to improve the following deficits and impairments:  Abnormal gait, Difficulty walking, Decreased strength, Decreased activity tolerance, Decreased endurance, Decreased balance, Impaired flexibility  Visit Diagnosis: Muscle weakness (generalized)  Other abnormalities of gait and mobility     Problem List Patient Active Problem List   Diagnosis Date Noted   History of osteomyelitis 11/12/2020   Leg weakness, bilateral 09/15/2020   Screening for malignant neoplasm of skin 05/31/2020   Exposure to confirmed case of COVID-19 05/18/2020   Immunocompromised state due to drug therapy ([employer]) 05/18/2020   Protein-calorie malnutrition, severe 05/14/2020   Chronic kidney disease (CKD), stage IV (severe) (HCC)    Chronic pain syndrome    Cancer associated pain    Pressure injury of skin 05/06/2020   Debility    Acute on chronic anemia    Thrombocytopenia ([place]) 04/26/2020   Esophageal candidiasis ([place]) 04/26/2020   Normocytic anemia 04/26/2020   CKD (chronic kidney disease) stage 4, GFR 15-29 ml/min ([place]) 04/26/2020   Wound of right groin 04/26/2020   Gastric ulcer without hemorrhage or perforation 04/26/2020   Malnutrition of moderate degree 04/26/2020   Acute osteomyelitis of left pelvic region and thigh ([place])    Pressure ulcer of coccygeal region, stage 2 ([place]) 04/21/2020   Weakness 04/20/2020   Acute deep vein thrombosis (DVT) of femoral vein ([place]) 12/05/2019   Iron deficiency anemia 11/10/2019   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([place]) 11/06/2019   Sprain of anterior talofibular ligament of right ankle 11/22/2016   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 [place], [place], [address] Phone: [phone]   Fax:  [phone]  Name: Christina Mack MRN: [id number] Date of Birth: 1966/03/11

## 2020-12-15 ENCOUNTER — Encounter: Payer: Self-pay | Admitting: Gynecologic Oncology

## 2020-12-16 ENCOUNTER — Ambulatory Visit: Admitting: Physical Therapy

## 2020-12-17 ENCOUNTER — Other Ambulatory Visit: Payer: Self-pay

## 2020-12-17 ENCOUNTER — Encounter (HOSPITAL_BASED_OUTPATIENT_CLINIC_OR_DEPARTMENT_OTHER): Attending: Internal Medicine | Admitting: Internal Medicine

## 2020-12-17 ENCOUNTER — Ambulatory Visit
Admission: RE | Admit: 2020-12-17 | Discharge: 2020-12-17 | Disposition: A | Discharge status: Home / Self Care | Payer: TRICARE For Life (TFL) | Source: Ambulatory Visit | Attending: Emergency Medicine | Admitting: Emergency Medicine

## 2020-12-17 VITALS — BP 131/81 | HR 96 | Temp 98.9°F | Resp 16

## 2020-12-17 DIAGNOSIS — X58XXXA Exposure to other specified factors, initial encounter: Secondary | ICD-10-CM | POA: Insufficient documentation

## 2020-12-17 DIAGNOSIS — Z923 Personal history of irradiation: Secondary | ICD-10-CM | POA: Diagnosis not present

## 2020-12-17 DIAGNOSIS — Z86718 Personal history of other venous thrombosis and embolism: Secondary | ICD-10-CM | POA: Diagnosis not present

## 2020-12-17 DIAGNOSIS — M8660 Other chronic osteomyelitis, unspecified site: Secondary | ICD-10-CM | POA: Diagnosis not present

## 2020-12-17 DIAGNOSIS — M8668 Other chronic osteomyelitis, other site: Secondary | ICD-10-CM | POA: Insufficient documentation

## 2020-12-17 DIAGNOSIS — Z940 Kidney transplant status: Secondary | ICD-10-CM | POA: Insufficient documentation

## 2020-12-17 DIAGNOSIS — M25551 Pain in right hip: Secondary | ICD-10-CM

## 2020-12-17 DIAGNOSIS — S31103A Unspecified open wound of abdominal wall, right lower quadrant without penetration into peritoneal cavity, initial encounter: Secondary | ICD-10-CM | POA: Insufficient documentation

## 2020-12-17 DIAGNOSIS — S3140XA Unspecified open wound of vagina and vulva, initial encounter: Secondary | ICD-10-CM | POA: Insufficient documentation

## 2020-12-17 DIAGNOSIS — Z8544 Personal history of malignant neoplasm of other female genital organs: Secondary | ICD-10-CM | POA: Diagnosis not present

## 2020-12-17 MED ORDER — CYCLOBENZAPRINE HCL 5 MG PO TABS: 5.0000 mg | ORAL_TABLET | Freq: Two times a day (BID) | ORAL | 0 refills | Status: DC | PRN

## 2020-12-17 NOTE — Discharge Instructions (Signed)
Continue with Tylenol during the day, supplement with Flexeril at home/bedtime-this may cause drowsiness as this is muscle relaxer Alternate ice and heat Gentle movement and stretching, activity as tolerated Please go for x-ray if not seeing any improvement over the next 2 to 3 days or symptoms worsening-I will call with results

## 2020-12-17 NOTE — Progress Notes (Addendum)
Christina Mack ([id number]) Visit Report for 12/17/2020 Allergy List Details Patient Name: Date of Service: Christina Mack, Christina Mack 12/17/2020 9:00 [place] Record Number: [id number] Patient Account Number: [id number] Date of Birth/Sex: Treating RN: March 11, 1966 (55 y.o. Sue Lush Primary Care Jamielee Mchale: Drue Stager Other Clinician: Referring Saara Kijowski: Treating Vearl Allbaugh/Extender: Kalman Shan CRO SS, MELISSA Weeks in Treatment: 0 Allergies Active Allergies oxycodone-acetaminophen Type: Medication Zosyn hydromorphone amlodipine codeine Allergy Notes Electronic Signature(s) Signed: 12/17/2020 1:24:27 PM By: Lorrin Jackson Entered By: Lorrin Jackson on 12/17/2020 09:09:00 -------------------------------------------------------------------------------- Arrival Information Details Patient Name: Date of Service: Christina Panning S. 12/17/2020 9:00 A M Medical Record Number: [id number] Patient Account Number: [id number] Date of Birth/Sex: Treating RN: 09-24-1965 (55 y.o. Sue Lush Primary Care Aliz Meritt: Drue Stager Other Clinician: Referring Cameren Odwyer: Treating Rome Echavarria/Extender: Kalman Shan CRO SS, MELISSA Weeks in Treatment: 0 Visit Information Patient Arrived: Cane Arrival Time: 09:07 Transfer Assistance: None Patient Identification Verified: Yes Secondary Verification Process Completed: Yes Patient Requires Transmission-Based Precautions: No Patient Has Alerts: Yes Patient Alerts: Patient on Blood Thinner Electronic Signature(s) Signed: 12/17/2020 1:24:27 PM By: Lorrin Jackson Entered By: Lorrin Jackson on 12/17/2020 09:08:09 -------------------------------------------------------------------------------- Clinic Level of Care Assessment Details Patient Name: Date of Service: Christina Mack 12/17/2020 9:00 A M Medical Record Number: [id number] Patient Account Number: [id number] Date of Birth/Sex: Treating RN: 10-31-1965 (55 y.o. Sue Lush Primary Care Issam Carlyon: Drue Stager Other Clinician: Referring Imberly Troxler: Treating Baptiste Littler/Extender: Kalman Shan CRO SS, MELISSA Weeks in Treatment: 0 Clinic Level of Care Assessment Items TOOL 2 Quantity Score X- 1 0 Use when only an EandM is performed on the INITIAL visit ASSESSMENTS - Nursing Assessment / Reassessment X- 1 20 General Physical Exam (combine w/ comprehensive assessment (listed just below) when performed on new pt. evals) X- 1 25 Comprehensive Assessment (HX, ROS, Risk Assessments, Wounds Hx, etc.) ASSESSMENTS - Wound and Skin A ssessment / Reassessment X - Simple Wound Assessment / Reassessment - one wound 1 5 []  - 0 Complex Wound Assessment / Reassessment - multiple wounds []  - 0 Dermatologic / Skin Assessment (not related to wound area) ASSESSMENTS - Ostomy and/or Continence Assessment and Care []  - 0 Incontinence Assessment and Management []  - 0 Ostomy Care Assessment and Management (repouching, etc.) PROCESS - Coordination of Care X - Simple Patient / Family Education for ongoing care 1 15 []  - 0 Complex (extensive) Patient / Family Education for ongoing care X- 1 10 Staff obtains Programmer, systems, Records, T Results / Process Orders est X- 1 10 Staff telephones HHA, Nursing Homes / Clarify orders / etc []  - 0 Routine Transfer to another Facility (non-emergent condition) []  - 0 Routine Hospital Admission (non-emergent condition) []  - 0 New Admissions / Biomedical engineer / Ordering NPWT Apligraf, etc. , []  - 0 Emergency Hospital Admission (emergent condition) []  - 0 Simple Discharge Coordination []  - 0 Complex (extensive) Discharge Coordination PROCESS - Special Needs []  - 0 Pediatric / Minor Patient Management []  - 0 Isolation Patient Management []  - 0 Hearing / Language / Visual special needs []  - 0 Assessment of Community assistance (transportation, D/C planning, etc.) []  - 0 Additional assistance / Altered mentation []  -  0 Support Surface(s) Assessment (bed, cushion, seat, etc.) INTERVENTIONS - Wound Cleansing / Measurement X- 1 5 Wound Imaging (photographs - any number of wounds) []  - 0 Wound Tracing (instead of photographs) X- 1 5 Simple Wound Measurement - one wound []  - 0  Complex Wound Measurement - multiple wounds X- 1 5 Simple Wound Cleansing - one wound []  - 0 Complex Wound Cleansing - multiple wounds INTERVENTIONS - Wound Dressings []  - 0 Small Wound Dressing one or multiple wounds X- 1 15 Medium Wound Dressing one or multiple wounds []  - 0 Large Wound Dressing one or multiple wounds []  - 0 Application of Medications - injection INTERVENTIONS - Miscellaneous []  - 0 External ear exam []  - 0 Specimen Collection (cultures, biopsies, blood, body fluids, etc.) []  - 0 Specimen(s) / Culture(s) sent or taken to Lab for analysis []  - 0 Patient Transfer (multiple staff / Civil Service fast streamer / Similar devices) []  - 0 Simple Staple / Suture removal (25 or less) []  - 0 Complex Staple / Suture removal (26 or more) []  - 0 Hypo / Hyperglycemic Management (close monitor of Blood Glucose) []  - 0 Ankle / Brachial Index (ABI) - do not check if billed separately Has the patient been seen at the hospital within the last three years: Yes Total Score: 115 Level Of Care: New/Established - Level 3 Electronic Signature(s) Signed: 12/17/2020 1:24:27 PM By: Lorrin Jackson Entered By: Lorrin Jackson on 12/17/2020 10:08:31 -------------------------------------------------------------------------------- Encounter Discharge Information Details Patient Name: Date of Service: Christina Panning S. 12/17/2020 9:00 A M Medical Record Number: [id number] Patient Account Number: [id number] Date of Birth/Sex: Treating RN: 05-29-1965 (55 y.o. Sue Lush Primary Care Eryk Beavers: Drue Stager Other Clinician: Referring Elainna Eshleman: Treating Cass Edinger/Extender: Kalman Shan CRO SS, MELISSA Weeks in Treatment: 0 Encounter  Discharge Information Items Discharge Condition: Stable Ambulatory Status: Cane Discharge Destination: Home Transportation: Private Auto Schedule Follow-up Appointment: Yes Clinical Summary of Care: Provided on 12/17/2020 Form Type Recipient Paper Patient Patient Electronic Signature(s) Signed: 12/17/2020 1:24:27 PM By: Lorrin Jackson Entered By: Lorrin Jackson on 12/17/2020 10:09:36 -------------------------------------------------------------------------------- Lower Extremity Assessment Details Patient Name: Date of Service: Oscar La. 12/17/2020 9:00 A M Medical Record Number: [id number] Patient Account Number: [id number] Date of Birth/Sex: Treating RN: 12-Oct-1965 (55 y.o. Sue Lush Primary Care Ambriella Kitt: Drue Stager Other Clinician: Referring Lilo Wallington: Treating Seymone Forlenza/Extender: Kalman Shan CRO SS, MELISSA Weeks in Treatment: 0 Electronic Signature(s) Signed: 12/17/2020 1:24:27 PM By: Lorrin Jackson Entered By: Lorrin Jackson on 12/17/2020 09:18:52 -------------------------------------------------------------------------------- Multi Wound Chart Details Patient Name: Date of Service: Christina Panning S. 12/17/2020 9:00 A M Medical Record Number: [id number] Patient Account Number: [id number] Date of Birth/Sex: Treating RN: 02/05/1966 (55 y.o. Elam Dutch Primary Care Estephani Popper: Drue Stager Other Clinician: Referring Lewanda Perea: Treating Aceyn Kathol/Extender: Kalman Shan CRO SS, MELISSA Weeks in Treatment: 0 Vital Signs Height(in): Pulse(bpm): 94 Weight(lbs): Blood Pressure(mmHg): 134/86 Body Mass Index(BMI): Temperature(F): 98.6 Respiratory Rate(breaths/min): 18 Photos: [N/A:N/A] Right Perineum N/A N/A Wound Location: Other Lesion N/A N/A Wounding Event: Necrosis (Radiation) N/A N/A Primary Etiology: Anemia, Deep Vein Thrombosis, Gout, N/A N/A Comorbid History: Received Chemotherapy, Received Radiation 01/13/2020 N/A  N/A Date Acquired: 0 N/A N/A Weeks of Treatment: Open N/A N/A Wound Status: 5x3x0.6 N/A N/A Measurements L x W x D (cm) 11.781 N/A N/A A (cm) : rea 7.069 N/A N/A Volume (cm) : 0.00% N/A N/A % Reduction in Area: 0.00% N/A N/A % Reduction in Volume: Full Thickness Without Exposed N/A N/A Classification: Support Structures Medium N/A N/A Exudate Amount: Purulent N/A N/A Exudate Type: yellow, brown, green N/A N/A Exudate Color: Distinct, outline attached N/A N/A Wound Margin: Large (67-100%) N/A N/A Granulation Amount: Red N/A N/A Granulation Quality: Small (1-33%) N/A N/A Necrotic  Amount: Fat Layer (Subcutaneous Tissue): Yes N/A N/A Exposed Structures: Fascia: No Tendon: No Muscle: No Joint: No Bone: No None N/A N/A Epithelialization: Treatment Notes Wound #1 (Perineum) Wound Laterality: Right Cleanser Soap and Water Discharge Instruction: May shower and wash wound with dial antibacterial soap and water prior to dressing change. Peri-Wound Care Topical Primary Dressing KerraCel Ag Gelling Fiber Dressing, 4x5 in (silver alginate) Discharge Instruction: Apply silver alginate to wound bed as instructed Secondary Dressing Secured With Undergarments Compression Wrap Compression Stockings Add-Ons Electronic Signature(s) Signed: 12/30/2020 6:02:00 PM By: Baruch Gouty RN, BSN Signed: 12/30/2020 6:39:19 PM By: Kalman Shan DO Entered By: Kalman Shan on 12/30/2020 12:41:04 -------------------------------------------------------------------------------- Multi-Disciplinary Care Plan Details Patient Name: Date of Service: Christina Panning S. 12/17/2020 9:00 [place] Record Number: [id number] Patient Account Number: [id number] Date of Birth/Sex: Treating RN: [birth year] (55 y.o. 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 Goals: Patient and/or family will be able to state/discuss factors appropriate to the management of their disease process during treatment Date Initiated: 12/17/2020 Target Resolution Date: 01/14/2021 Goal Status: Active Interventions: Assess patient's knowledge and expectations regarding hyperbaric medicine and provide education related to the hyperbaric environment, goals of treatment and prevention of adverse events Notes: Wound/Skin Impairment Nursing Diagnoses: Impaired tissue integrity Goals: Patient/caregiver will verbalize understanding of skin care regimen Date Initiated: 12/17/2020 Target Resolution Date: 01/14/2021 Goal Status: Active Ulcer/skin breakdown will have a volume reduction of 30% by week 4 Date Initiated: 12/17/2020 Target Resolution Date: 01/14/2021 Goal Status: Active Interventions: Assess patient/caregiver ability to obtain necessary supplies Assess patient/caregiver ability to perform ulcer/skin care regimen upon admission and as needed Assess ulceration(s) every visit Provide education on ulcer and skin care Treatment Activities: Topical wound management initiated : 12/17/2020 Notes: Electronic Signature(s) Signed: 12/17/2020 1:24:27 PM By: Lorrin Jackson Entered By: Lorrin Jackson on 12/17/2020 09:38:49 -------------------------------------------------------------------------------- Pain Assessment Details Patient Name: Date of Service: Christina Panning S. 12/17/2020 9:00 [place] Record Number: [id number] Patient Account Number: [id number] Date of Birth/Sex: Treating RN: April 23, 1965 (55 y.o. Sue Lush Primary Care Maleiah Dula: Drue Stager Other Clinician: Referring Emonie Espericueta: Treating Micha Dosanjh/Extender: Kalman Shan CRO SS, MELISSA Weeks in Treatment: 0 Active Problems Location of Pain  Severity and Description of Pain Patient Has Paino Yes Site Locations Pain Location: Pain in Ulcers Rate the pain. Current Pain Level: 3 Character of Pain Describe the Pain: Aching, Tender Pain Management and Medication Current Pain Management: Medication: Yes Cold Application: No Rest: Yes Massage: No Activity: No T.E.N.S.: No Heat Application: No Leg drop or elevation: No Is the Current Pain Management Adequate: Adequate How does your wound impact your activities of daily livingo Sleep: No Bathing: No Appetite: No Relationship With Others: No Bladder Continence: No Emotions: No Bowel Continence: No Work: No Toileting: No Drive: No Dressing: No Hobbies: No Electronic Signature(s) Signed: 12/17/2020 1:24:27 PM By: Lorrin Jackson Entered By: Lorrin Jackson on 12/17/2020 78:29:56 -------------------------------------------------------------------------------- Patient/Caregiver Education Details Patient Name: Date of Service: Oscar La 9/16/2022andnbsp9:00 A M Medical Record Number: [id number] Patient Account Number: [id number] Date of Birth/Gender: Treating RN: 04-Mar-1966 (55 y.o. Sue Lush Primary Care Physician: Drue Stager Other Clinician: Referring Physician: Treating Physician/Extender: Kalman Shan CRO SS, MELISSA Weeks in Treatment: 0 Education Assessment Education Provided To:  Patient Education Topics Provided Hyperbaric Oxygenation: Methods: Explain/Verbal Responses: State content correctly Wound/Skin Impairment: Methods: Demonstration, Explain/Verbal Responses: State content correctly Electronic Signature(s) Signed: 12/17/2020 1:24:27 PM By: Lorrin Jackson Entered By: Lorrin Jackson on 12/17/2020 09:37:29 -------------------------------------------------------------------------------- Wound Assessment Details Patient Name: Date of Service: Christina Panning S. 12/17/2020 9:00 A M Medical Record Number: [id number] Patient  Account Number: [id number] Date of Birth/Sex: Treating RN: [birth year] (55 y.o. Sue Lush Primary Care Leverett Camplin: Drue Stager Other Clinician: Referring Jaimon Bugaj: Treating Tami Barren/Extender: Kalman Shan CRO SS, MELISSA Weeks in Treatment: 0 Wound Status Wound Number: 1 Primary Necrosis (Radiation) Etiology: Wound Location: Right Perineum Wound Open Wounding Event: Other Lesion Status: Date Acquired: 01/13/2020 Comorbid Anemia, Deep Vein Thrombosis, Gout, Received Weeks Of Treatment: 0 History: Chemotherapy, Received Radiation Clustered Wound: No Photos Wound Measurements Length: (cm) 5 Width: (cm) 3 Depth: (cm) 0.6 Area: (cm) 11.781 Volume: (cm) 7.069 % Reduction in Area: 0% % Reduction in Volume: 0% Epithelialization: None Tunneling: No Undermining: No Wound Description Classification: Full Thickness Without Exposed Support Struct Wound Margin: Distinct, outline attached Exudate Amount: Medium Exudate Type: Purulent Exudate Color: yellow, brown, green ures Foul Odor After Cleansing: No Slough/Fibrino Yes Wound Bed Granulation Amount: Large (67-100%) Exposed Structure Granulation Quality: Red Fascia Exposed: No Necrotic Amount: Small (1-33%) Fat Layer (Subcutaneous Tissue) Exposed: Yes Necrotic Quality: Adherent Slough Tendon Exposed: No Muscle Exposed: No Joint Exposed: No Bone Exposed: No Treatment Notes Wound #1 (Perineum) Wound Laterality: Right Cleanser Soap and Water Discharge Instruction: May shower and wash wound with dial antibacterial soap and water prior to dressing change. Peri-Wound Care Topical Primary Dressing KerraCel Ag Gelling Fiber Dressing, 4x5 in (silver alginate) Discharge Instruction: Apply silver alginate to wound bed as instructed Secondary Dressing Secured With Undergarments Compression Wrap Compression Stockings Add-Ons Electronic Signature(s) Signed: 12/17/2020 1:24:27 PM By: Lorrin Jackson Entered By:  Lorrin Jackson on 12/17/2020 09:34:58 -------------------------------------------------------------------------------- [facility] Details Patient Name: Date of Service: Christina Panning S. 12/17/2020 9:00 [place] Record Number: [id number] Patient Account Number: [id number] Date of Birth/Sex: Treating RN: February 27, 1966 (55 y.o. Sue Lush Primary Care Thurlow Gallaga: Drue Stager Other Clinician: Referring Duong Haydel: Treating Eula Jaster/Extender: Kalman Shan CRO SS, MELISSA Weeks in Treatment: 0 Vital Signs Time Taken: 09:08 Temperature (F): 98.6 Pulse (bpm): 94 Respiratory Rate (breaths/min): 18 Blood Pressure (mmHg): 134/86 Reference Range: 80 - 120 mg / dl Electronic Signature(s) Signed: 12/17/2020 1:24:27 PM By: Lorrin Jackson Entered By: Lorrin Jackson on 12/17/2020 09:08:40

## 2020-12-17 NOTE — Progress Notes (Signed)
Christina Mack, Christina Mack ([id number]) Visit Report for 12/17/2020 Abuse/Suicide Risk Screen Details Patient Name: Date of Service: Christina Mack, Christina Mack 12/17/2020 9:00 [place] Record Number: [id number] Patient Account Number: [id number] Date of Birth/Sex: Treating RN: March 06, 1966 (55 y.o. Sue Lush Primary Care Taggert Bozzi: Drue Stager Other Clinician: Referring Aayla Marrocco: Treating Leylah Tarnow/Extender: Kalman Shan CRO SS, MELISSA Weeks in Treatment: 0 Abuse/Suicide Risk Screen Items Answer ABUSE RISK SCREEN: Has anyone close to you tried to hurt or harm you recentlyo No Do you feel uncomfortable with anyone in your familyo No Has anyone forced you do things that you didnt want to doo No Electronic Signature(s) Signed: 12/17/2020 1:24:27 PM By: Lorrin Jackson Entered By: Lorrin Jackson on 12/17/2020 09:16:06 -------------------------------------------------------------------------------- Activities of Daily Living Details Patient Name: Date of Service: Christina Mack, Christina Mack 12/17/2020 9:00 [place] Record Number: [id number] Patient Account Number: [id number] Date of Birth/Sex: Treating RN: 03-Dec-1965 (55 y.o. Sue Lush Primary Care Merrilyn Legler: Drue Stager Other Clinician: Referring Beyonce Sawatzky: Treating Eran Mistry/Extender: Kalman Shan CRO SS, MELISSA Weeks in Treatment: 0 Activities of Daily Living Items Answer Activities of Daily Living (Please select one for each item) Drive Automobile Completely Able T Medications ake Completely Able Use T elephone Completely Able Care for Appearance Completely Able Use T oilet Completely Able Bath / Shower Completely Able Dress Self Completely Able Feed Self Completely Able Walk Completely Able Get In / Out Bed Completely Able Housework Completely Able Prepare Meals Completely [place] for Self Completely Able Electronic Signature(s) Signed: 12/17/2020 1:24:27 PM By: Lorrin Jackson Entered By:  Lorrin Jackson on 12/17/2020 09:16:33 -------------------------------------------------------------------------------- Education Screening Details Patient Name: Date of Service: Christina Panning S. 12/17/2020 9:00 A M Medical Record Number: [id number] Patient Account Number: [id number] Date of Birth/Sex: Treating RN: 07-13-1965 (55 y.o. Sue Lush Primary Care Horald Birky: Drue Stager Other Clinician: Referring Michaelpaul Apo: Treating Ambra Haverstick/Extender: Kalman Shan CRO SS, MELISSA Weeks in Treatment: 0 Primary Learner Assessed: Patient Learning Preferences/Education Level/Primary Language Learning Preference: Explanation, Demonstration, Printed Material Highest Education Level: College or Above Preferred Language: English Cognitive Barrier Language Barrier: No Translator Needed: No Memory Deficit: No Emotional Barrier: No Cultural/Religious Beliefs Affecting Medical Care: No Physical Barrier Impaired Vision: Yes Glasses Impaired Hearing: No Decreased Hand dexterity: No Knowledge/Comprehension Knowledge Level: High Comprehension Level: High Ability to understand written instructions: High Ability to understand verbal instructions: High Motivation Anxiety Level: Calm Cooperation: Cooperative Education Importance: Acknowledges Need Interest in Health Problems: Asks Questions Perception: Coherent Willingness to Engage in Self-Management High Activities: Readiness to Engage in Self-Management High Activities: Electronic Signature(s) Signed: 12/17/2020 1:24:27 PM By: Lorrin Jackson Entered By: Lorrin Jackson on 12/17/2020 09:17:44 -------------------------------------------------------------------------------- Fall Risk Assessment Details Patient Name: Date of Service: Christina Panning S. 12/17/2020 9:00 [place] Record Number: [id number] Patient Account Number: [id number] Date of Birth/Sex: Treating RN: Nov 07, 1965 (55 y.o. Sue Lush Primary Care Aanshi Batchelder: Drue Stager Other Clinician: Referring Kathey Simer: Treating Vada Swift/Extender: Kalman Shan CRO SS, MELISSA Weeks in Treatment: 0 Fall Risk Assessment Items Have you had 2 or more falls in the last 12 monthso 0 No Have you had any fall that resulted in injury in the last 12 monthso 0 No FALLS RISK SCREEN History of falling - immediate or within 3 months 0 No Secondary diagnosis (Do you have 2 or more medical diagnoseso) 15 Yes Ambulatory aid None/bed rest/wheelchair/nurse 0 No Crutches/cane/walker 15 Yes Furniture 0 No Intravenous  therapy Access/Saline/Heparin Lock 0 No Gait/Transferring Normal/ bed rest/ wheelchair 0 No Weak (short steps with or without shuffle, stooped but able to lift head while walking, may seek 10 Yes support from furniture) Impaired (short steps with shuffle, may have difficulty arising from chair, head down, impaired 0 No balance) Mental Status Oriented to own ability 0 Yes Electronic Signature(s) Signed: 12/17/2020 1:24:27 PM By: Lorrin Jackson Entered By: Lorrin Jackson on 12/17/2020 09:18:04 -------------------------------------------------------------------------------- Foot Assessment Details Patient Name: Date of Service: Christina Panning S. 12/17/2020 9:00 A M Medical Record Number: [id number] Patient Account Number: [id number] Date of Birth/Sex: Treating RN: 1965-11-15 (55 y.o. Sue Lush Primary Care Paticia Moster: Drue Stager Other Clinician: Referring Ellason Segar: Treating Domnic Vantol/Extender: Kalman Shan CRO SS, MELISSA Weeks in Treatment: 0 Foot Assessment Items Site Locations + = Sensation present, - = Sensation absent, C = Callus, U = Ulcer R = Redness, W = Warmth, M = Maceration, PU = Pre-ulcerative lesion F = Fissure, S = Swelling, D = Dryness Assessment Right: Left: Other Deformity: No No Prior Foot Ulcer: No No Prior Amputation: No No Charcot Joint: No No Ambulatory Status: Gait: Notes N/A: Perineal  Wound Electronic Signature(s) Signed: 12/17/2020 1:24:27 PM By: Lorrin Jackson Entered By: Lorrin Jackson on 12/17/2020 09:18:46 -------------------------------------------------------------------------------- Nutrition Risk Screening Details Patient Name: Date of Service: Christina Panning S. 12/17/2020 9:00 [place] Record Number: [id number] Patient Account Number: [id number] Date of Birth/Sex: Treating RN: 1965/12/30 (55 y.o. Sue Lush Primary Care Elica Almas: Drue Stager Other Clinician: Referring Markale Birdsell: Treating Zowie Lundahl/Extender: Kalman Shan CRO SS, MELISSA Weeks in Treatment: 0 Height (in): Weight (lbs): Body Mass Index (BMI): Nutrition Risk Screening Items Score Screening NUTRITION RISK SCREEN: I have an illness or condition that made me change the kind and/or amount of food I eat 0 No I eat fewer than two meals per day 0 No I eat few fruits and vegetables, or milk products 0 No I have three or more drinks of beer, liquor or wine almost every day 0 No I have tooth or mouth problems that make it hard for me to eat 0 No I don't always have enough money to buy the food I need 0 No I eat alone most of the time 0 No I take three or more different prescribed or over-the-counter drugs a day 1 Yes Without wanting to, I have lost or gained 10 pounds in the last six months 0 No I am not always physically able to shop, [place] and/or feed myself 0 No Nutrition Protocols Good Risk Protocol 0 No interventions needed Moderate Risk Protocol High Risk Proctocol Risk Level: Good Risk Score: 1 Electronic Signature(s) Signed: 12/17/2020 1:24:27 PM By: Lorrin Jackson Entered By: Lorrin Jackson on 12/17/2020 78:29:56

## 2020-12-17 NOTE — ED Provider Notes (Signed)
[facility]    CSN: [id number] Arrival date & time: 12/17/20  1118      History   Chief Complaint Chief Complaint  Patient presents with   Hip Pain    HPI Christina Mack is a 55 y.o. female history of kidney transplan,t on immunosuppressants, hypertension, pressure ulcers, presenting today for evaluation of right hip pain.  Reports symptoms began Wednesday and were dull, worsened over the past 1 to 2 days.  Reports prior to onset she walked her dog and felt pulling in multiple directions.  Pain is on right side and radiates into right upper thigh.  Pain with hip flexion.  Reports already in physical therapy to strengthen her hip flexors.  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  Using Tylenol with relief during the day.  Symptoms worse at nighttime and with lying flat.  Denies specific injury trauma or fall.  HPI  Past Medical History:  Diagnosis Date   Complication of anesthesia    nausea and vomiting   DVT (deep venous thrombosis) ([place])    History of osteomyelitis    Kidney transplant recipient    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[facility])     Patient Active Problem List   Diagnosis Date Noted   History of osteomyelitis 11/12/2020   Leg weakness, bilateral 09/15/2020   Screening for malignant neoplasm of skin 05/31/2020   Exposure to confirmed case of COVID-19 05/18/2020   Immunocompromised state due to drug therapy ([place]) 05/18/2020   Protein-calorie malnutrition, severe 05/14/2020   Chronic kidney disease (CKD), stage IV (severe) (HCC)    Chronic pain syndrome    Cancer associated pain    Pressure injury of skin 05/06/2020   Debility    Acute on chronic anemia    Thrombocytopenia ([place]) 04/26/2020   Esophageal candidiasis ([place]) 04/26/2020   Normocytic anemia 04/26/2020   CKD (chronic kidney disease) stage 4, GFR 15-29 ml/min ([place]) 04/26/2020   Wound of right groin 04/26/2020   Gastric ulcer without hemorrhage or  perforation 04/26/2020   Malnutrition of moderate degree 04/26/2020   Acute osteomyelitis of left pelvic region and thigh ([place])    Pressure ulcer of coccygeal region, stage 2 ([place]) 04/21/2020   Weakness 04/20/2020   Acute deep vein thrombosis (DVT) of femoral vein ([place]) 12/05/2019   Iron deficiency anemia 11/10/2019   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([place]) 11/06/2019   Sprain of anterior talofibular ligament of right ankle 11/22/2016   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   Aftercare following organ transplant 02/24/2013   Immunosuppression ([place]) 02/24/2013   Nausea 02/24/2013   Postoperative abdominal pain 02/24/2013   Ureteral stent displacement ([place]) 02/24/2013   Kidney transplant status 11/22/2012   HTN (hypertension) 11/22/2012   Hyperparathyroidism, 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    Past Surgical History:  Procedure Laterality Date   BIOPSY  04/24/2020   Procedure: BIOPSY;  Surgeon: Doran Stabler, MD;  Location: [facility];  Service: Gastroenterology;;   ESOPHAGOGASTRODUODENOSCOPY (EGD) WITH PROPOFOL N/A 04/24/2020   Procedure: ESOPHAGOGASTRODUODENOSCOPY (EGD) WITH PROPOFOL;  Surgeon: Doran Stabler, MD;  Location: [facility];  Service: Gastroenterology;  Laterality: N/A;   IR US GUIDE BX ASP/DRAIN  04/30/2020   KIDNEY TRANSPLANT  2001   KIDNEY TRANSPLANT  2014   LEEP     TOTAL ABDOMINAL HYSTERECTOMY     ovaries left in situ    OB History  Gravida  1   Para  1   Term      Preterm      AB      Living         SAB      IAB      Ectopic      Multiple      Live Births               Home Medications    Prior to Admission medications   Medication Sig Start Date End Date Taking? Authorizing Provider  cyclobenzaprine (FLEXERIL) 5 MG tablet Take 1-2 tablets (5-10 mg total) by mouth 2 (two) times daily as needed for muscle spasms. 12/17/20  Yes Kasen Adduci C, PA-C  acetaminophen (TYLENOL) 500 MG  tablet Take 2 tablets (1,000 mg total) by mouth 3 (three) times daily. Patient not taking: No sig reported 05/05/20   Gaylan Gerold, DO  apixaban (ELIQUIS) 2.5 MG TABS tablet TAKE 1 TABLET (2.5 MG TOTAL) BY MOUTH DAILY. 05/17/20 05/17/21  Bary Leriche, PA-C  Cholecalciferol 125 MCG (5000 UT) capsule Take by mouth. 09/30/20   [provider]  fluticasone (FLONASE) 50 MCG/ACT nasal spray Place 1 spray into both nostrils daily as needed. 05/16/17   [provider]  pantoprazole (PROTONIX) 40 MG tablet TAKE 1 TABLET (40 MG TOTAL) BY MOUTH 2 (TWO) TIMES DAILY BEFORE A MEAL. 05/17/20 05/17/21  Love, Ivan Anchors, PA-C  predniSONE (DELTASONE) 5 MG tablet Take 5 mg by mouth daily with breakfast.    [provider]  PROGRAF 1 MG capsule Take 1 mg by mouth 2 (two) times daily. 2mg  in morning 1mg  at night 01/31/20   [provider]    Family History Family History  Problem Relation Age of Onset   Cerebral aneurysm Mother    AAA (abdominal aortic aneurysm) Brother    Breast cancer Paternal Aunt    Heart attack Maternal Grandmother    Brain cancer Maternal Grandfather    Colon cancer Neg Hx    Ovarian cancer Neg Hx    Endometrial cancer Neg Hx    Pancreatic cancer Neg Hx    Prostate cancer Neg Hx     Social History Social History   Tobacco Use   Smoking status: Never   Smokeless tobacco: Never  Vaping Use   Vaping Use: Never used  Substance Use Topics   Alcohol use: Not Currently   Drug use: Never     Allergies   Oxycodone-acetaminophen, Zosyn [piperacillin sod-tazobactam so], Hydromorphone, Amlodipine, and Codeine   Review of Systems Review of Systems  Constitutional:  Negative for fatigue and fever.  HENT:  Negative for mouth sores.   Eyes:  Negative for visual disturbance.  Respiratory:  Negative for shortness of breath.   Cardiovascular:  Negative for chest pain.  Gastrointestinal:  Negative for abdominal pain, nausea and vomiting.  Genitourinary:   Negative for genital sores.  Musculoskeletal:  Positive for arthralgias. Negative for joint swelling.  Skin:  Negative for color change, rash and wound.  Neurological:  Negative for dizziness, weakness, light-headedness and headaches.    Physical Exam Triage Vital Signs ED Triage Vitals  Enc Vitals Group     BP 12/17/20 1242 131/81     Pulse Rate 12/17/20 1241 96     Resp 12/17/20 1241 16     Temp 12/17/20 1242 98.9 F (37.2 C)     Temp Source 12/17/20 1242 Oral     SpO2 --  Weight --      Height --      Head Circumference --      Peak Flow --      Pain Score 12/17/20 1242 2     Pain Loc --      Pain Edu? --      Excl. in [place]? --    No data found.  Updated Vital Signs BP 131/81   Pulse 96   Temp 98.9 F (37.2 C) (Oral)   Resp 16   Visual Acuity Right Eye Distance:   Left Eye Distance:   Bilateral Distance:    Right Eye Near:   Left Eye Near:    Bilateral Near:     Physical Exam Vitals and nursing note reviewed.  Constitutional:      Appearance: She is well-developed.     Comments: No acute distress  HENT:     Head: Normocephalic and atraumatic.     Nose: Nose normal.  Eyes:     Conjunctiva/sclera: Conjunctivae normal.  Cardiovascular:     Rate and Rhythm: Normal rate.  Pulmonary:     Effort: Pulmonary effort is normal. No respiratory distress.  Abdominal:     General: There is no distension.  Musculoskeletal:        General: Normal range of motion.     Cervical back: Neck supple.     Comments: Ambulating with antalgia with cane Tenderness to palpation to anterior right groin extending into anterior lateral proximal thigh musculature  Skin:    General: Skin is warm and dry.     Comments: No overlying rash discoloration erythema or bruising  Neurological:     Mental Status: She is alert and oriented to person, place, and time.     UC Treatments / Results  Labs (all labs ordered are listed, but only abnormal results are displayed) Labs  Reviewed - No data to display  EKG   Radiology No results found.  Procedures Procedures (including critical care time)  Medications Ordered in UC Medications - No data to display  Initial Impression / Assessment and Plan / UC Course  I have reviewed the triage vital signs and the nursing notes.  Pertinent labs & imaging results that were available during my care of the patient were reviewed by me and considered in my medical decision making (see chart for details).     Right hip pain-suspect likely muscular in nature, lower suspicion of fracture given without negatives of injury, but given given patient significant medical history did place order for hip x-ray to evaluate for possible pathological fracture if not seeing any improvement over the next 2 to 3 days.  Continue with Tylenol, avoid NSAIDs given on Eliquis and kidney history, supplement with Flexeril at bedtime.  Alternate ice and heat, activity modification.  Discussed strict return precautions. Patient verbalized understanding and is agreeable with plan.  Final Clinical Impressions(s) / UC Diagnoses   Final diagnoses:  Right hip pain     Discharge Instructions      Continue with Tylenol during the day, supplement with Flexeril at home/bedtime-this may cause drowsiness as this is muscle relaxer Alternate ice and heat Gentle movement and stretching, activity as tolerated Please go for x-ray if not seeing any improvement over the next 2 to 3 days or symptoms worsening-I will call with results     ED Prescriptions     Medication Sig Dispense Auth. Provider   cyclobenzaprine (FLEXERIL) 5 MG tablet Take 1-2 tablets (5-10 mg total)  by mouth 2 (two) times daily as needed for muscle spasms. 24 tablet Lelani Garnett, [place] C, PA-C      PDMP not reviewed this encounter.   Janith Lima, [place] 12/17/20 1342

## 2020-12-17 NOTE — ED Triage Notes (Signed)
Patient presents to [facility] for evaluation of right hip pain, which was dull when she woke up on Wednesday.  Worsening into Thursday.  States Tuesday she was taking her dog to doggie daycare and he pulled her in multiple directions in the parking lot and she is not sure if she strained something.  Patient uses a cane to walk.  Denies any specific known injury.  Denies any previous surgeries.

## 2020-12-20 ENCOUNTER — Ambulatory Visit: Admitting: Physical Therapy

## 2020-12-20 ENCOUNTER — Telehealth: Payer: Self-pay | Admitting: Physical Therapy

## 2020-12-20 NOTE — Telephone Encounter (Signed)
Attempted to contact patient due to missed PT appointment. Left VM informing patient of missed appointment and reminder for next scheduled appointment on 12/23/20. Advised patient of attendance policy.  Hilda Blades, PT, DPT, LAT, ATC 12/20/20  11:30 AM Phone: [phone] Fax: [phone]

## 2020-12-20 NOTE — Progress Notes (Addendum)
JERLISA, DILIBERTO ([id number]) Visit Report for 12/17/2020 Chief Complaint Document Details Patient Name: Date of Service: Christina Mack, Christina Mack 12/17/2020 9:00 [place] Record Number: [id number] Patient Account Number: [id number] Date of Birth/Sex: Treating RN: Jun 06, 1965 (55 y.o. Christina Mack Primary Care Provider: Drue Stager Other Clinician: Referring Provider: Treating Provider/Extender: Kalman Shan CRO SS, MELISSA Weeks in Treatment: 0 Information Obtained from: Patient Chief Complaint right sided groin/labia wound and HBO assessment for treatment Electronic Signature(s) Signed: 12/30/2020 6:39:19 PM By: Kalman Shan DO Entered By: Kalman Shan on 12/30/2020 12:41:45 -------------------------------------------------------------------------------- HPI Details Patient Name: Date of Service: Christina Panning S. 12/17/2020 9:00 [place] Record Number: [id number] Patient Account Number: [id number] Date of Birth/Sex: Treating RN: 1966-01-01 (55 y.o. 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 Patient has a history of high-grade dysplasia of the vulvar region initially treated 10 years ago with resection and laser ablation (2010). She ultimately presented with a large lesion involving her right vulva and groin (09/10/2019). 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 She underwent primary chemoradiation. She had an open wound to this area and ultimately developed osteomyelitis secondary to chronic nonhealing open  wound. Patient had a pelvis MRI on 04/27/2020 that showed evidence of osteomyelitis involving the anterior left pubic symphysis and inferior right pubic rami. Patient has been following with infectious disease and She has completed 8 weeks of tedizolid and 12 weeks of metronidazole plus Levaquin in May 2022. She had a repeat MRI of the pelvis on 11/19/2020 that showed severe bone marrow edema and cortical destruction in the right inferior pubic ramus extending into the pubic body. This was concerning for osteomyelitis of the right inferior pubic ramus. T her right labial/groin wound she is using silver alginate. She currently denies systemic signs of infection. o Electronic Signature(s) Signed: 12/30/2020 6:39:19 PM By: Kalman Shan DO Entered By: Kalman Shan on 12/30/2020 12:42:07 -------------------------------------------------------------------------------- Physical Exam Details Patient Name: Date of Service: Christina Panning S. 12/17/2020 9:00 A M Medical Record Number: [id number] Patient Account Number: [id number] Date of Birth/Sex: Treating RN: [birth year] (55 y.o. Christina Mack Primary Care Provider: Drue Stager Other Clinician: Referring Provider: Treating Provider/Extender: Kalman Shan CRO SS, MELISSA Weeks in Treatment: 0 Constitutional respirations regular, non-labored and within target range for patient.Marland Kitchen Psychiatric pleasant and cooperative. Notes open wound to the right labia/groin region with non viable tissue, granulation tissue and serous drainage noted Electronic Signature(s) Signed: 12/30/2020 6:39:19 PM By: Kalman Shan DO Entered By: Kalman Shan on 12/30/2020 12:42:56 -------------------------------------------------------------------------------- Physician Orders Details Patient Name: Date of Service: Christina Panning S. 12/17/2020 9:00 A M Medical Record Number: [id number] Patient Account Number: [id number] Date of Birth/Sex: Treating  RN: Dec 03, 1965 (55 y.o. Christina Mack Primary Care Provider: Drue Stager Other Clinician: Referring Provider: Treating Provider/Extender: Kalman Shan CRO SS, MELISSA Weeks in Treatment: 0 Verbal / Phone Orders: No Diagnosis Coding ICD-10 Coding Code Description M86.60 Other chronic osteomyelitis, unspecified site S31.40XA Unspecified open wound of vagina and vulva, initial encounter Follow-up Appointments  Return appointment in 3 weeks. - with Dr. Heber [place] Bathing/ Shower/ Hygiene May shower and wash wound with soap and water. 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 Prim Dressing: KerraCel Ag Gelling Fiber Dressing, 4x5 in (silver alginate) ary Discharge Instructions: Apply silver alginate to wound bed as instructed Secured With: Undergarments Electronic Signature(s) Signed: 12/30/2020 6:39:19 PM By: Kalman Shan DO Previous Signature: 12/17/2020 1:24:27 PM Version By: Lorrin Jackson Previous Signature: 12/17/2020 1:24:27 PM Version By: Lorrin Jackson Previous Signature: 12/20/2020 4:44:34 PM Version By: Kalman Shan DO Entered By: Kalman Shan on 12/30/2020 12:44:37 -------------------------------------------------------------------------------- Problem List Details Patient Name: Date of Service: Christina Panning S. 12/17/2020 9:00 A M Medical Record Number: [id number] Patient Account Number: [id number] Date of Birth/Sex: Treating RN: October 08, 1965 (55 y.o. Christina Mack Primary Care Provider: Drue Stager Other Clinician: Referring Provider: Treating Provider/Extender: Kalman Shan CRO SS, MELISSA Weeks in Treatment: 0 Active Problems ICD-10 Encounter Code Description Active Date MDM Diagnosis M86.60 Other chronic osteomyelitis, unspecified site 12/17/2020 No Yes S31.40XA Unspecified open wound of vagina and vulva, initial encounter 12/17/2020 No Yes Inactive Problems Resolved Problems Electronic Signature(s) Signed: 12/30/2020 6:39:19 PM By: Kalman Shan DO Entered By: Kalman Shan on 12/30/2020 12:40:51 -------------------------------------------------------------------------------- Progress Note Details Patient Name: Date of Service: Christina Panning S. 12/17/2020 9:00 [place] Record Number: [id number] Patient Account Number: [id number] Date of Birth/Sex: Treating RN: 1966/03/17 (55 y.o. 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 Patient has a history of high-grade dysplasia of the vulvar region initially treated 10 years ago with resection and laser ablation (2010). She ultimately presented with a large lesion involving her right vulva and groin (09/10/2019). 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 She underwent primary chemoradiation. She had an open wound to this area and ultimately developed osteomyelitis secondary to chronic nonhealing open  wound. Patient had a pelvis MRI on 04/27/2020 that showed evidence of osteomyelitis involving the anterior left pubic symphysis and inferior right pubic rami. Patient has been following with infectious disease and She has completed 8 weeks of tedizolid and 12 weeks of metronidazole plus Levaquin in May 2022. She had a repeat MRI of the pelvis on 11/19/2020 that showed severe bone marrow edema and cortical destruction in the right inferior pubic ramus extending into the pubic body. This was concerning for osteomyelitis of the right inferior pubic ramus. T her right labial/groin wound she is using silver alginate. She currently denies systemic signs of infection. o Patient History Information obtained from Patient. Allergies oxycodone-acetaminophen, Zosyn, hydromorphone, amlodipine, codeine Family History  Cancer - Maternal Grandparents, Diabetes - Maternal Grandparents, Heart Disease - Maternal Grandparents, Hypertension - Maternal Grandparents, Kidney Disease - Paternal Grandparents, Stroke - Mother, No family history of Hereditary Spherocytosis, Lung Disease, Seizures, Thyroid Problems, Tuberculosis. Social History Never smoker, Marital Status - Married, Alcohol Use - Rarely, Drug Use - No History, Caffeine Use - Moderate. Medical History Hematologic/Lymphatic Patient has history of Anemia Cardiovascular Patient has history of Deep Vein Thrombosis - 2021 Musculoskeletal Patient has history of Gout Oncologic Patient has history of Received Chemotherapy - 2021, Received Radiation - 2021 Medical A Surgical History Notes nd Cardiovascular AAA Genitourinary Kidney 2001, 2014 Oncologic Vulvar Cancer diagnosed 09/2019 Review of Systems (ROS) Eyes Complains or has symptoms of Glasses / Contacts. Ear/Nose/Mouth/Throat Denies complaints or symptoms of Chronic sinus problems or rhinitis. Respiratory Denies complaints or symptoms of Chronic or frequent coughs, Shortness of  Breath. Gastrointestinal Denies complaints or symptoms of Frequent diarrhea, Nausea, Vomiting. Endocrine Denies complaints or symptoms of Heat/cold intolerance. Integumentary (Skin) Complains or has symptoms of Wounds. Psychiatric Denies complaints or symptoms of Claustrophobia, Suicidal. Objective Constitutional respirations regular, non-labored and within target range for patient.. Vitals Time Taken: 9:08 AM, Temperature: 98.6 F, Pulse: 94 bpm, Respiratory Rate: 18 breaths/min, Blood Pressure: 134/86 mmHg. Psychiatric pleasant and cooperative. General Notes: open wound to the right labia/groin region with non viable tissue, granulation tissue and serous drainage noted Integumentary (Hair, Skin) Wound #1 status is Open. Original cause of wound was Other Lesion. The date acquired was: 01/13/2020. The wound is located on the Right Perineum. The wound measures 5cm length x 3cm width x 0.6cm depth; 11.781cm^2 area and 7.069cm^3 volume. There is Fat Layer (Subcutaneous Tissue) exposed. There is no tunneling or undermining noted. There is a medium amount of purulent drainage noted. The wound margin is distinct with the outline attached to the wound base. There is large (67-100%) red granulation within the wound bed. There is a small (1-33%) amount of necrotic tissue within the wound bed including Adherent Slough. Assessment Active Problems ICD-10 Other chronic osteomyelitis, unspecified site Unspecified open wound of vagina and vulva, initial encounter Patient presents with an open wound to her vaginal/groin area secondary to chronic refractory osteomyelitis all a result of complications from vulvar cancer. Per oncology notes cancer is in remission. I recommended continuing silver alginate to the wound. I think she would benefit from HBO completing 2.0 ATA for 90 min with 5 min air breaks along with continued wound care. We would be looking for improvement in measurment and appearane of the  wound. Patient had a chest x-ray 04/2020 that did not show acute abnormalities. She also had an EKG on 05/04/20 that was normal sinus rhythm. 52 minutes was spent on the encounter including face to face, EMR review and coordination of care Plan Follow-up Appointments: Return appointment in 3 weeks. - with Dr. Heber [place] Bathing/ Shower/ Hygiene: May shower and wash wound with soap and water. 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 Prim Dressing: KerraCel Ag Gelling Fiber Dressing, 4x5 in (silver alginate) ary Discharge Instructions: Apply silver alginate to wound bed as instructed Secured  With: Undergarments 1. Silver alginate 2. HBO assessment for Production designer, theatre/television/film) Signed: 12/30/2020 6:39:19 PM By: Kalman Shan DO Entered By: Kalman Shan on 12/30/2020 18:38:28 -------------------------------------------------------------------------------- HxROS Details Patient Name: Date of Service: Christina Panning S. 12/17/2020 9:00 [place] Record Number: [id number] Patient Account Number: [id number] Date of Birth/Sex: Treating RN: 1966-03-13 (55 y.o. Christina Mack Primary Care Provider: Drue Stager Other Clinician: Referring Provider: Treating Provider/Extender: Kalman Shan CRO SS, MELISSA Weeks in Treatment: 0 Information Obtained From Patient Eyes Complaints and Symptoms: Positive for: Glasses / Contacts Ear/Nose/Mouth/Throat Complaints and Symptoms: Negative for: Chronic sinus  problems or rhinitis Respiratory Complaints and Symptoms: Negative for: Chronic or frequent coughs; Shortness of Breath Gastrointestinal Complaints and Symptoms: Negative for: Frequent diarrhea; Nausea; Vomiting Endocrine Complaints and Symptoms: Negative for: Heat/cold intolerance Integumentary (Skin) Complaints and Symptoms: Positive for: Wounds Psychiatric Complaints and Symptoms: Negative for: Claustrophobia; Suicidal Hematologic/Lymphatic Medical History: Positive for: Anemia Cardiovascular Medical History: Positive for: Deep Vein Thrombosis - 2021 Past Medical History Notes: AAA Genitourinary Medical History: Past Medical History Notes: Kidney 2001, 2014 Immunological Musculoskeletal Medical History: Positive for: Gout Oncologic Medical History: Positive for: Received Chemotherapy - 2021; Received Radiation - 2021 Past Medical History Notes: Vulvar Cancer diagnosed 09/2019 Immunizations Pneumococcal Vaccine: Received Pneumococcal Vaccination: Yes Received Pneumococcal Vaccination On or After 60th Birthday: No Implantable Devices None Family and Social History Cancer: Yes - Maternal Grandparents; Diabetes: Yes - Maternal Grandparents; Heart Disease: Yes - Maternal Grandparents; Hereditary Spherocytosis: No; Hypertension: Yes - Maternal Grandparents; Kidney Disease: Yes - Paternal Grandparents; Lung Disease: No; Seizures: No; Stroke: Yes - Mother; Thyroid Problems: No; Tuberculosis: No; Never smoker; Marital Status - Married; Alcohol Use: Rarely; Drug Use: No History; Caffeine Use: Moderate; Financial Concerns: No; Food, Clothing or Shelter Needs: No; Support System Lacking: No; Transportation Concerns: No Electronic Signature(s) Signed: 12/17/2020 1:24:27 PM By: Lorrin Jackson Signed: 12/20/2020 4:44:34 PM By: Kalman Shan DO Entered By: Lorrin Jackson on 12/17/2020  09:15:54 -------------------------------------------------------------------------------- [place] Details Patient Name: Date of Service: [place] 12/17/2020 Medical Record Number: [id number] Patient Account Number: [id number] Date of Birth/Sex: Treating RN: [birth year] (55 y.o. Christina Mack Primary Care Provider: Drue Stager Other Clinician: Referring Provider: Treating Provider/Extender: Kalman Shan CRO SS, MELISSA Weeks in Treatment: 0 Diagnosis Coding ICD-10 Codes Code Description M86.60 Other chronic osteomyelitis, unspecified site S31.40XA Unspecified open wound of vagina and vulva, initial encounter Facility Procedures CPT4 Code: [id number] Description: 99213 - WOUND CARE VISIT-LEV 3 EST PT Modifier: 25 Quantity: 1 Physician Procedures : CPT4 Code Description Modifier [id number] - WC PHYS LEVEL 4 - NEW PT ICD-10 Diagnosis Description M86.60 Other chronic osteomyelitis, unspecified site S31.40XA Unspecified open wound of vagina and vulva, initial encounter Quantity: 1 Electronic Signature(s) Signed: 12/30/2020 6:39:19 PM By: Kalman Shan DO Previous Signature: 12/17/2020 1:24:27 PM Version By: Lorrin Jackson Previous Signature: 12/20/2020 4:44:34 PM Version By: Kalman Shan DO Entered By: Kalman Shan on 12/30/2020 18:37:33

## 2020-12-23 ENCOUNTER — Encounter: Payer: Self-pay | Admitting: Gynecologic Oncology

## 2020-12-23 ENCOUNTER — Ambulatory Visit: Admitting: Physical Therapy

## 2020-12-27 ENCOUNTER — Ambulatory Visit: Attending: Physical Medicine and Rehabilitation | Admitting: Physical Therapy

## 2020-12-27 ENCOUNTER — Other Ambulatory Visit: Payer: Self-pay

## 2020-12-27 ENCOUNTER — Encounter: Payer: Self-pay | Admitting: Physical Therapy

## 2020-12-27 DIAGNOSIS — R2689 Other abnormalities of gait and mobility: Secondary | ICD-10-CM | POA: Diagnosis present

## 2020-12-27 DIAGNOSIS — M6281 Muscle weakness (generalized): Secondary | ICD-10-CM | POA: Insufficient documentation

## 2020-12-27 NOTE — Therapy (Signed)
[place], [place], [address] Phone: [phone]   Fax:  [phone]  Physical Therapy Treatment  Patient Details  Name: Christina Mack MRN: [id number] Date of Birth: 10-15-1965 Referring Provider (PT): Courtney Heys, MD   Encounter Date: 12/27/2020   PT End of Session - 12/27/20 1021     Visit Number 7    Number of Visits 21    Date for PT Re-Evaluation 01/20/21    Authorization Type TRIC[employer]    PT Start Time 1000    PT Stop Time 1045    PT Time Calculation (min) 45 min    Activity Tolerance Patient tolerated treatment well    Behavior During Therapy [facility] for tasks assessed/performed             Past Medical History:  Diagnosis Date   Complication of anesthesia    nausea and vomiting   DVT (deep venous thrombosis) (Christina Mack)    History of osteomyelitis    Kidney transplant recipient    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Christina Mack)     Past Surgical History:  Procedure Laterality Date   BIOPSY  04/24/2020   Procedure: BIOPSY;  Surgeon: Doran Stabler, MD;  Location: [place];  Service: Gastroenterology;;   ESOPHAGOGASTRODUODENOSCOPY (EGD) WITH PROPOFOL N/A 04/24/2020   Procedure: ESOPHAGOGASTRODUODENOSCOPY (EGD) WITH PROPOFOL;  Surgeon: Doran Stabler, MD;  Location: [place];  Service: Gastroenterology;  Laterality: N/A;   IR US GUIDE BX ASP/DRAIN  04/30/2020   KIDNEY TRANSPLANT  2001   KIDNEY TRANSPLANT  2014   LEEP     TOTAL ABDOMINAL HYSTERECTOMY     ovaries left in situ    There were no vitals filed for this visit.   Subjective Assessment - 12/27/20 1010     Subjective Patient reports she strained her right hip when she was walking one of her dogs. She had to go to urgent care and was unable to do much. She still has trouble lifting the leg to get dressed.    Patient Stated Goals Walk without cane, squat to get stuff off floor or out of low cabinet, improve getting in/out of car    Currently in  Pain? Yes    Pain Score 4     Pain Location Hip    Pain Orientation Right    Pain Descriptors / Indicators Tightness;Sore    Pain Type Chronic pain    Pain Onset More than a month ago    Pain Frequency Intermittent    Aggravating Factors  Lifting right leg                [facility] PT Assessment - 12/27/20 0001       Strength   Right Hip Flexion 3/5    Right Hip Extension 3/5    Right Hip ABduction 3+/5                           [facility] Adult PT Treatment/Exercise - 12/27/20 0001       Exercises   Exercises Knee/Hip      Knee/Hip Exercises: Seated   Sit to Sand 2 sets;10 reps;without UE support      Knee/Hip Exercises: Supine   Short Arc Quad Sets 2 sets;15 reps    Short Arc Quad Sets Limitations 2.5#    Heel Slides 2 sets;10 reps   right only   Bridges with Ball Squeeze 2 sets;10 reps   partial  range   Straight Leg Raises 2 sets;10 reps   left only, unable on right   Other Supine Knee/Hip Exercises Hooklyng alternating march 2 x 20    Other Supine Knee/Hip Exercises Clamshell with yellow unilateral 2 x 10 each      Knee/Hip Exercises: Sidelying   Hip ABduction 2 sets;10 reps                     PT Education - 12/27/20 1021     Education Details HEP update    Person(s) Educated Patient    Methods Explanation;Demonstration;Verbal cues    Comprehension Returned demonstration;Verbal cues required;Verbalized understanding;Need further instruction              PT Short Term Goals - 11/25/20 1017       PT SHORT TERM GOAL #1   Title Patient will be I with initial HEP to progress with PT    Time 3    Period Weeks    Status Achieved    Target Date 11/08/20      PT SHORT TERM GOAL #2   Title PT will review FOTO with patient by 3rd visit to understand expected progress    Baseline reviewed 3rd visit    Time 3    Period Weeks    Status Achieved    Target Date 11/08/20               PT Long Term Goals - 11/25/20 1017        PT LONG TERM GOAL #1   Title Patient will be I with final HEP to maintain progress from PT    Baseline progressing with HEP    Time 8    Period Weeks    Status On-going    Target Date 01/20/21      PT LONG TERM GOAL #2   Title Patient will report >/= 62% functional status on FOTO to indicate improved functional ability    Baseline 58% functional status    Time 8    Period Weeks    Status On-going    Target Date 01/20/21      PT LONG TERM GOAL #3   Title Patient will demonsrate >/= 4-/5 MMT hip strength and >/= 4+/5 MMT knee strength to improve stair negotiation    Baseline continues to demonstrate gross weakness    Time 8    Period Weeks    Status On-going    Target Date 01/20/21      PT LONG TERM GOAL #4   Title Patient will demonstrate improved 5xSTS to </= 12 sec to reduce fall risk    Baseline 12 seconds    Time 6    Period Weeks    Status Achieved      PT LONG TERM GOAL #5   Title Patient will demonstrate 6MWT >/= 1000 ft without AD to improve community access    Baseline unable to perform due to onset of pelvic/groin pain    Time 8    Period Weeks    Status Unable to assess    Target Date 01/20/21                   Plan - 12/27/20 1026     Clinical Impression Statement Patient limited in therapy due to right hip flexor pain, but was able to tolerate mat based exercises. Therapy focused on gradual reintroduction of exercises to improve strength of the right hip. Updated HEP to make  exercises more appropriate for how the patient is doing currently with the increase in right hip pain. Patient would benefit from continued skilled PT to progress her mobility and strength in order to improve walking ability and maximize her functional mobility.    PT Treatment/Interventions ADLs/Self Care Home Management;Aquatic Therapy;Neuromuscular re-education;Balance training;Therapeutic exercise;Therapeutic activities;Functional mobility training;Stair training;Gait  training;Patient/family education;Manual techniques;Passive range of motion;Energy conservation    PT Next Visit Plan Review HEP and progress PRN, progress generalized LE strength, step-ups/squats, balance training    PT Home Exercise Plan [email or web address]    Consulted and Agree with Plan of Care Patient             Patient will benefit from skilled therapeutic intervention in order to improve the following deficits and impairments:  Abnormal gait, Difficulty walking, Decreased strength, Decreased activity tolerance, Decreased endurance, Decreased balance, Impaired flexibility  Visit Diagnosis: Muscle weakness (generalized)  Other abnormalities of gait and mobility     Problem List Patient Active Problem List   Diagnosis Date Noted   History of osteomyelitis 11/12/2020   Leg weakness, bilateral 09/15/2020   Screening for malignant neoplasm of skin 05/31/2020   Exposure to confirmed case of COVID-19 05/18/2020   Immunocompromised state due to drug therapy ([place]) 05/18/2020   Protein-calorie malnutrition, severe 05/14/2020   Chronic kidney disease (CKD), stage IV (severe) (HCC)    Chronic pain syndrome    Cancer associated pain    Pressure injury of skin 05/06/2020   Debility    Acute on chronic anemia    Thrombocytopenia ([place]) 04/26/2020   Esophageal candidiasis ([place]) 04/26/2020   Normocytic anemia 04/26/2020   CKD (chronic kidney disease) stage 4, GFR 15-29 ml/min ([place]) 04/26/2020   Wound of right groin 04/26/2020   Gastric ulcer without hemorrhage or perforation 04/26/2020   Malnutrition of moderate degree 04/26/2020   Acute osteomyelitis of left pelvic region and thigh ([place])    Pressure ulcer of coccygeal region, stage 2 ([place]) 04/21/2020   Weakness 04/20/2020   Acute deep vein thrombosis (DVT) of femoral vein ([place]) 12/05/2019   Iron deficiency anemia 11/10/2019   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([place]) 11/06/2019   Sprain of anterior talofibular ligament of right ankle 11/22/2016   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 [place], [place], [address] Phone: [phone]   Fax:  [phone]  Name: Christina Mack MRN: [id number] Date of Birth: 03-31-1966

## 2020-12-27 NOTE — Patient Instructions (Signed)
Access Code: [email or web address] URL: [email or web address] Date: 12/27/2020 Prepared by: Hilda Blades  Exercises Modified Marcello Moores Stretch - 1-2 x daily - 7 x weekly - 2 reps - 60 hold Bridge - 1 x daily - 5 x weekly - 3 sets - 10 reps - 3 hold Supine March - 1 x daily - 5 x weekly - 3 sets - 20 reps Supine Heel Slide - 1 x daily - 5 x weekly - 3 sets - 10 reps Hooklying Isometric Clamshell - 1 x daily - 5 x weekly - 3 sets - 10 reps Sidelying Hip Abduction - 1 x daily - 5 x weekly - 3 sets - 10 reps Seated Knee Extension with Resistance - 1 x daily - 5 x weekly - 3 sets - 10 reps Seated Hamstring Curls with Resistance - 1 x daily - 5 x weekly - 3 sets - 10 reps Sit to Stand - 1 x daily - 5 x weekly - 3 sets - 10 reps Standing Hip Extension on Swiss Ball - 1 x daily - 5 x weekly - 3 sets - 10 reps Heel Toe Raises with Counter Support - 1 x daily - 5 x weekly - 32 sets - 10 reps Standing Tandem Balance with Counter Support - 1 x daily - 7 x weekly - 3 sets - 30 hold

## 2020-12-30 ENCOUNTER — Other Ambulatory Visit: Payer: Self-pay

## 2020-12-30 ENCOUNTER — Ambulatory Visit: Admitting: Physical Therapy

## 2020-12-30 ENCOUNTER — Encounter: Payer: Self-pay | Admitting: Physical Therapy

## 2020-12-30 DIAGNOSIS — R2689 Other abnormalities of gait and mobility: Secondary | ICD-10-CM

## 2020-12-30 DIAGNOSIS — M6281 Muscle weakness (generalized): Secondary | ICD-10-CM | POA: Diagnosis not present

## 2020-12-30 NOTE — Therapy (Signed)
[place], [place], [address] Phone: [phone]   Fax:  [phone]  Physical Therapy Treatment  Patient Details  Name: Christina Mack MRN: [id number] Date of Birth: 06/07/1965 Referring Provider (PT): Courtney Heys, MD   Encounter Date: 12/30/2020   PT End of Session - 12/30/20 0959     Visit Number 8    Number of Visits 21    Date for PT Re-Evaluation 01/20/21    Authorization Type TRICARE    PT Start Time [phone]    PT Stop Time 1040    PT Time Calculation (min) 44 min    Activity Tolerance Patient tolerated treatment well    Behavior During Therapy [facility] for tasks assessed/performed             Past Medical History:  Diagnosis Date   Complication of anesthesia    nausea and vomiting   DVT (deep venous thrombosis) ([place])    History of osteomyelitis    Kidney transplant recipient    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[facility])     Past Surgical History:  Procedure Laterality Date   BIOPSY  04/24/2020   Procedure: BIOPSY;  Surgeon: Doran Stabler, MD;  Location: [place];  Service: Gastroenterology;;   ESOPHAGOGASTRODUODENOSCOPY (EGD) WITH PROPOFOL N/A 04/24/2020   Procedure: ESOPHAGOGASTRODUODENOSCOPY (EGD) WITH PROPOFOL;  Surgeon: Doran Stabler, MD;  Location: [place];  Service: Gastroenterology;  Laterality: N/A;   IR US GUIDE BX ASP/DRAIN  04/30/2020   KIDNEY TRANSPLANT  2001   KIDNEY TRANSPLANT  2014   LEEP     TOTAL ABDOMINAL HYSTERECTOMY     ovaries left in situ    There were no vitals filed for this visit.   Subjective Assessment - 12/30/20 0957     Subjective Patient reports she is doing well, no new issues since last visit. She reports that she can lift her leg better to get her clothes on now. She does continue to report difficulty stepping over objects or squatting down.    Patient Stated Goals Walk without cane, squat to get stuff off floor or out of low cabinet, improve getting  in/out of car    Currently in Pain? Yes    Pain Score 0-No pain    Pain Location Hip    Pain Orientation Right    Pain Descriptors / Indicators Tightness;Sore    Pain Type Chronic pain    Pain Onset More than a month ago    Pain Frequency Intermittent    Aggravating Factors  Lifting right leg                [facility] PT Assessment - 12/30/20 0001       Strength   Right Hip Flexion 3/5    Right Hip ABduction 3+/5                           [facility] Adult PT Treatment/Exercise - 12/30/20 0001       Neuro Re-ed    Neuro Re-ed Details  SLS 3 x 30 sec each   patient required occasional UE support more on right     Exercises   Exercises Knee/Hip      Knee/Hip Exercises: Aerobic   Nustep L6 x 6 min with LE only while taking subjective      Knee/Hip Exercises: Standing   Heel Raises 2 sets;20 reps    Forward Step Up 2 sets;10 reps  Forward Step Up Limitations 6" step with TRX assist    Functional Squat 2 sets;10 reps    Functional Squat Limitations TRX assisted      Knee/Hip Exercises: Seated   Sit to Sand 10 reps;without UE support      Knee/Hip Exercises: Supine   Quad Sets 10 reps   5 sec hold   Quad Sets Limitations right only    Bridges 2 sets;10 reps   3 sec hold   Straight Leg Raises 2 sets;10 reps    Straight Leg Raises Limitations partial range on right      Knee/Hip Exercises: Sidelying   Hip ABduction 2 sets;10 reps                     PT Education - 12/30/20 0959     Education Details HEP    Person(s) Educated Patient    Methods Explanation;Demonstration;Verbal cues    Comprehension Verbalized understanding;Returned demonstration;Verbal cues required;Need further instruction              PT Short Term Goals - 11/25/20 1017       PT SHORT TERM GOAL #1   Title Patient will be I with initial HEP to progress with PT    Time 3    Period Weeks    Status Achieved    Target Date 11/08/20      PT SHORT TERM GOAL #2    Title PT will review FOTO with patient by 3rd visit to understand expected progress    Baseline reviewed 3rd visit    Time 3    Period Weeks    Status Achieved    Target Date 11/08/20               PT Long Term Goals - 11/25/20 1017       PT LONG TERM GOAL #1   Title Patient will be I with final HEP to maintain progress from PT    Baseline progressing with HEP    Time 8    Period Weeks    Status On-going    Target Date 01/20/21      PT LONG TERM GOAL #2   Title Patient will report >/= 62% functional status on FOTO to indicate improved functional ability    Baseline 58% functional status    Time 8    Period Weeks    Status On-going    Target Date 01/20/21      PT LONG TERM GOAL #3   Title Patient will demonsrate >/= 4-/5 MMT hip strength and >/= 4+/5 MMT knee strength to improve stair negotiation    Baseline continues to demonstrate gross weakness    Time 8    Period Weeks    Status On-going    Target Date 01/20/21      PT LONG TERM GOAL #4   Title Patient will demonstrate improved 5xSTS to </= 12 sec to reduce fall risk    Baseline 12 seconds    Time 6    Period Weeks    Status Achieved      PT LONG TERM GOAL #5   Title Patient will demonstrate 6MWT >/= 1000 ft without AD to improve community access    Baseline unable to perform due to onset of pelvic/groin pain    Time 8    Period Weeks    Status Unable to assess    Target Date 01/20/21  Plan - 12/30/20 1000     Clinical Impression Statement Patient tolerated therapy much better this visit with no adverse effects. Therapy focused on continued progressive strengthening for the hips with good tolerance. She does continue to demonstrate right hip weakness particular with hip flexion, and she demonstrates a hip flexor/hasmtring tightness when lying supine so use of quad sets to work on getting the right leg staight in supine. Incorporated progression of standing exercises this visit  and single leg balancewith good tolerance. No change to HEP. Patient would benefit from continued skilled PT to progress her mobility and strength in order to improve walking ability and maximize her functional mobility.    PT Treatment/Interventions ADLs/Self Care Home Management;Aquatic Therapy;Neuromuscular re-education;Balance training;Therapeutic exercise;Therapeutic activities;Functional mobility training;Stair training;Gait training;Patient/family education;Manual techniques;Passive range of motion;Energy conservation    PT Next Visit Plan Review HEP and progress PRN, progress generalized LE strength, step-ups/squats, balance training    PT Home Exercise Plan [email or web address]    Consulted and Agree with Plan of Care Patient             Patient will benefit from skilled therapeutic intervention in order to improve the following deficits and impairments:  Abnormal gait, Difficulty walking, Decreased strength, Decreased activity tolerance, Decreased endurance, Decreased balance, Impaired flexibility  Visit Diagnosis: Muscle weakness (generalized)  Other abnormalities of gait and mobility     Problem List Patient Active Problem List   Diagnosis Date Noted   History of osteomyelitis 11/12/2020   Leg weakness, bilateral 09/15/2020   Screening for malignant neoplasm of skin 05/31/2020   Exposure to confirmed case of COVID-19 05/18/2020   Immunocompromised state due to drug therapy ([place]) 05/18/2020   Protein-calorie malnutrition, severe 05/14/2020   Chronic kidney disease (CKD), stage IV (severe) (HCC)    Chronic pain syndrome    Cancer associated pain    Pressure injury of skin 05/06/2020   Debility    Acute on chronic anemia    Thrombocytopenia ([place]) 04/26/2020   Esophageal candidiasis ([place]) 04/26/2020   Normocytic anemia 04/26/2020   CKD (chronic kidney disease) stage 4, GFR 15-29 ml/min ([place]) 04/26/2020   Wound of right groin 04/26/2020   Gastric ulcer without hemorrhage or  perforation 04/26/2020   Malnutrition of moderate degree 04/26/2020   Acute osteomyelitis of left pelvic region and thigh ([place])    Pressure ulcer of coccygeal region, stage 2 ([place]) 04/21/2020   Weakness 04/20/2020   Acute deep vein thrombosis (DVT) of femoral vein ([place]) 12/05/2019   Iron deficiency anemia 11/10/2019   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([place]) 11/06/2019   Sprain of anterior talofibular ligament of right ankle 11/22/2016   CMV disease ([place]) 03/02/2015   Aftercare following organ transplant 02/24/2013   Immunosuppression ([place]) 02/24/2013   Nausea 02/24/2013   Postoperative abdominal pain 02/24/2013   Ureteral stent displacement ([place]) 02/24/2013   Kidney transplant status 11/22/2012   HTN (hypertension) 11/22/2012   Hyperparathyroidism, secondary renal ([place]) 11/22/2012   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 [place], [place], [address] Phone: [phone]   Fax:  [phone]  Name: Christina Mack MRN: [id number] Date of Birth: 07-09-1965

## 2021-01-07 ENCOUNTER — Encounter (HOSPITAL_BASED_OUTPATIENT_CLINIC_OR_DEPARTMENT_OTHER): Payer: Self-pay

## 2021-01-07 ENCOUNTER — Encounter (HOSPITAL_BASED_OUTPATIENT_CLINIC_OR_DEPARTMENT_OTHER): Admitting: Internal Medicine

## 2021-01-07 ENCOUNTER — Other Ambulatory Visit: Payer: Self-pay

## 2021-01-10 ENCOUNTER — Encounter: Payer: Self-pay | Admitting: Physical Therapy

## 2021-01-10 ENCOUNTER — Other Ambulatory Visit: Payer: Self-pay

## 2021-01-10 ENCOUNTER — Ambulatory Visit: Attending: Physical Medicine and Rehabilitation | Admitting: Physical Therapy

## 2021-01-10 DIAGNOSIS — M6281 Muscle weakness (generalized): Secondary | ICD-10-CM

## 2021-01-10 DIAGNOSIS — R2689 Other abnormalities of gait and mobility: Secondary | ICD-10-CM | POA: Diagnosis present

## 2021-01-10 DIAGNOSIS — R5381 Other malaise: Secondary | ICD-10-CM | POA: Diagnosis not present

## 2021-01-10 NOTE — Therapy (Signed)
[place], [place], [address] Phone: [phone]   Fax:  [phone]  Physical Therapy Treatment  Patient Details  Name: Christina Mack MRN: [id number] Date of Birth: Jul 29, 1965 Referring Provider (PT): Courtney Heys, MD   Encounter Date: 01/10/2021   PT End of Session - 01/10/21 1640     Visit Number 9    Number of Visits 21    Date for PT Re-Evaluation 01/20/21    Authorization Type TRIC[employer]    PT Start Time 1615    PT Stop Time 1700    PT Time Calculation (min) 45 min    Activity Tolerance Patient tolerated treatment well    Behavior During Therapy [facility] for tasks assessed/performed             Past Medical History:  Diagnosis Date   Complication of anesthesia    nausea and vomiting   DVT (deep venous thrombosis) ([place])    History of osteomyelitis    Kidney transplant recipient    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[facility])     Past Surgical History:  Procedure Laterality Date   BIOPSY  04/24/2020   Procedure: BIOPSY;  Surgeon: Doran Stabler, MD;  Location: [place];  Service: Gastroenterology;;   ESOPHAGOGASTRODUODENOSCOPY (EGD) WITH PROPOFOL N/A 04/24/2020   Procedure: ESOPHAGOGASTRODUODENOSCOPY (EGD) WITH PROPOFOL;  Surgeon: Doran Stabler, MD;  Location: [place];  Service: Gastroenterology;  Laterality: N/A;   IR US GUIDE BX ASP/DRAIN  04/30/2020   KIDNEY TRANSPLANT  2001   KIDNEY TRANSPLANT  2014   LEEP     TOTAL ABDOMINAL HYSTERECTOMY     ovaries left in situ    There were no vitals filed for this visit.   Subjective Assessment - 01/10/21 1619     Subjective Patient reports she started work today and did a lot of sitting. She notes that she gets stiff when she sits too long or if she walks too long, also the left knee has been achy with cooler weather but the right hip is feeling better and less stiff. She reports that stairs are still difficult and she doesn't think she can squat low.     Patient Stated Goals Walk without cane, squat to get stuff off floor or out of low cabinet, improve getting in/out of car    Currently in Pain? No/denies    Pain Location Hip    Pain Orientation Right    Pain Type Chronic pain    Pain Onset More than a month ago    Pain Frequency Intermittent                [facility] PT Assessment - 01/10/21 0001       Strength   Right Hip Flexion 3+/5    Right Hip ABduction 3+/5    Left Hip ABduction 4-/5                           [facility] Adult PT Treatment/Exercise - 01/10/21 0001       Neuro Re-ed    Neuro Re-ed Details  SLS 3 x 30 sec each   patient required occasional UE support bilaterally     Exercises   Exercises Knee/Hip      Knee/Hip Exercises: Aerobic   Nustep L5 x 6 min with LE only while taking subjective      Knee/Hip Exercises: Standing   Heel Raises 2 sets;20 reps  Forward Step Up 10 reps    Forward Step Up Limitations 6" step with TRX assist    Functional Squat 2 sets;10 reps    Functional Squat Limitations TRX assisted    Other Standing Knee Exercises TRX assisted split lunge x 10 each      Knee/Hip Exercises: Supine   Bridges with Ball Squeeze 2 sets;10 reps    Straight Leg Raises 2 sets;10 reps    Straight Leg Raises Limitations partial range on right due to incisional pain    Other Supine Knee/Hip Exercises Hooklying march on right 2 x 10    Other Supine Knee/Hip Exercises Clamshell with green 2 x 10      Knee/Hip Exercises: Sidelying   Hip ABduction 2 sets;10 reps                     PT Education - 01/10/21 1622     Education Details HEP    Person(s) Educated Patient    Methods Explanation;Demonstration;Verbal cues    Comprehension Verbalized understanding;Returned demonstration;Verbal cues required;Need further instruction              PT Short Term Goals - 11/25/20 1017       PT SHORT TERM GOAL #1   Title Patient will be I with initial HEP to progress with PT     Time 3    Period Weeks    Status Achieved    Target Date 11/08/20      PT SHORT TERM GOAL #2   Title PT will review FOTO with patient by 3rd visit to understand expected progress    Baseline reviewed 3rd visit    Time 3    Period Weeks    Status Achieved    Target Date 11/08/20               PT Long Term Goals - 11/25/20 1017       PT LONG TERM GOAL #1   Title Patient will be I with final HEP to maintain progress from PT    Baseline progressing with HEP    Time 8    Period Weeks    Status On-going    Target Date 01/20/21      PT LONG TERM GOAL #2   Title Patient will report >/= 62% functional status on FOTO to indicate improved functional ability    Baseline 58% functional status    Time 8    Period Weeks    Status On-going    Target Date 01/20/21      PT LONG TERM GOAL #3   Title Patient will demonsrate >/= 4-/5 MMT hip strength and >/= 4+/5 MMT knee strength to improve stair negotiation    Baseline continues to demonstrate gross weakness    Time 8    Period Weeks    Status On-going    Target Date 01/20/21      PT LONG TERM GOAL #4   Title Patient will demonstrate improved 5xSTS to </= 12 sec to reduce fall risk    Baseline 12 seconds    Time 6    Period Weeks    Status Achieved      PT LONG TERM GOAL #5   Title Patient will demonstrate 6MWT >/= 1000 ft without AD to improve community access    Baseline unable to perform due to onset of pelvic/groin pain    Time 8    Period Weeks    Status Unable to assess  Target Date 01/20/21                   Plan - 01/10/21 1641     Clinical Impression Statement Patient tolerated therapy well with no adverse effects. Therapy focused on progressing patent's strength and squatting ability as she is returning to work and will be required to perfomr certaining functional tasks. Patient did will with strength progression and does demonstrate improvement in her hip strength. She did note incisional pain  with supine SLR but it resolved at completion of exercise. Patient would benefit from continued skilled PT to progress her mobility and strength in order to improve walking ability and maximize her functional mobility.    PT Treatment/Interventions ADLs/Self Care Home Management;Aquatic Therapy;Neuromuscular re-education;Balance training;Therapeutic exercise;Therapeutic activities;Functional mobility training;Stair training;Gait training;Patient/family education;Manual techniques;Passive range of motion;Energy conservation    PT Next Visit Plan Review HEP and progress PRN, progress generalized LE strength, step-ups/squats, balance training    PT Home Exercise Plan [email or web address]    Consulted and Agree with Plan of Care Patient             Patient will benefit from skilled therapeutic intervention in order to improve the following deficits and impairments:  Abnormal gait, Difficulty walking, Decreased strength, Decreased activity tolerance, Decreased endurance, Decreased balance, Impaired flexibility  Visit Diagnosis: Muscle weakness (generalized)  Other abnormalities of gait and mobility     Problem List Patient Active Problem List   Diagnosis Date Noted   History of osteomyelitis 11/12/2020   Leg weakness, bilateral 09/15/2020   Screening for malignant neoplasm of skin 05/31/2020   Exposure to confirmed case of COVID-19 05/18/2020   Immunocompromised state due to drug therapy ([place]) 05/18/2020   Protein-calorie malnutrition, severe 05/14/2020   Chronic kidney disease (CKD), stage IV (severe) ([facility])    Chronic pain syndrome    Cancer associated pain    Pressure injury of skin 05/06/2020   Debility    Acute on chronic anemia    Thrombocytopenia ([place]) 04/26/2020   Esophageal candidiasis ([place]) 04/26/2020   Normocytic anemia 04/26/2020   CKD (chronic kidney disease) stage 4, GFR 15-29 ml/min ([place]) 04/26/2020   Wound of right groin 04/26/2020   Gastric ulcer without hemorrhage or  perforation 04/26/2020   Malnutrition of moderate degree 04/26/2020   Acute osteomyelitis of left pelvic region and thigh ([place])    Pressure ulcer of coccygeal region, stage 2 ([place]) 04/21/2020   Weakness 04/20/2020   Acute deep vein thrombosis (DVT) of femoral vein ([place]) 12/05/2019   Iron deficiency anemia 11/10/2019   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([place]) 11/06/2019   Sprain of anterior talofibular ligament of right ankle 11/22/2016   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 [place], [place], [address] Phone: [phone]   Fax:  [phone]  Name: CHLORA MCBAIN MRN: [id number] Date of Birth: 05-Jul-1965

## 2021-01-10 NOTE — Patient Instructions (Signed)
Access Code: [email or web address] URL: [email or web address] Date: 01/10/2021 Prepared by: Hilda Blades  Exercises Modified Marcello Moores Stretch - 1-2 x daily - 7 x weekly - 2 reps - 60 hold Bridge - 1 x daily - 5 x weekly - 3 sets - 10 reps - 3 hold Supine March - 1 x daily - 5 x weekly - 3 sets - 20 reps Supine Heel Slide - 1 x daily - 5 x weekly - 3 sets - 10 reps Hooklying Isometric Clamshell - 1 x daily - 5 x weekly - 3 sets - 10 reps Sidelying Hip Abduction - 1 x daily - 5 x weekly - 3 sets - 10 reps Seated Knee Extension with Resistance - 1 x daily - 5 x weekly - 3 sets - 10 reps Seated Hamstring Curls with Resistance - 1 x daily - 5 x weekly - 3 sets - 10 reps Sit to Stand - 1 x daily - 5 x weekly - 3 sets - 10 reps Standing Hip Extension on Swiss Ball - 1 x daily - 5 x weekly - 3 sets - 10 reps Heel Toe Raises with Counter Support - 1 x daily - 5 x weekly - 32 sets - 10 reps Standing Tandem Balance with Counter Support - 1 x daily - 7 x weekly - 3 sets - 30 hold Single Leg Stance with Support - 1 x daily - 7 x weekly - 3 sets - 30 hold

## 2021-01-17 ENCOUNTER — Ambulatory Visit: Admitting: Physical Therapy

## 2021-01-18 ENCOUNTER — Encounter (HOSPITAL_BASED_OUTPATIENT_CLINIC_OR_DEPARTMENT_OTHER): Admitting: Internal Medicine

## 2021-01-24 ENCOUNTER — Ambulatory Visit: Admitting: Physical Therapy

## 2021-01-24 ENCOUNTER — Other Ambulatory Visit: Payer: Self-pay

## 2021-01-24 ENCOUNTER — Encounter: Payer: Self-pay | Admitting: Physical Therapy

## 2021-01-24 DIAGNOSIS — M6281 Muscle weakness (generalized): Secondary | ICD-10-CM

## 2021-01-24 DIAGNOSIS — R2689 Other abnormalities of gait and mobility: Secondary | ICD-10-CM

## 2021-01-24 NOTE — Therapy (Signed)
[place], [place], [address] Phone: [phone]   Fax:  [phone]  Physical Therapy Treatment  Progress Note Reporting Period 10/18/2020 to 01/24/2021  See note below for Objective Data and Assessment of Progress/Goals.    Patient Details  Name: Christina Mack MRN: [id number] Date of Birth: 12-30-1965 Referring Provider (PT): Courtney Heys, MD   Encounter Date: 01/24/2021   PT End of Session - 01/24/21 1625     Visit Number 10    Number of Visits 18    Date for PT Re-Evaluation 03/21/21    Authorization Type TRICARE    PT Start Time 1615    PT Stop Time 1700    PT Time Calculation (min) 45 min    Activity Tolerance Patient tolerated treatment well    Behavior During Therapy [facility] for tasks assessed/performed             Past Medical History:  Diagnosis Date   Complication of anesthesia    nausea and vomiting   DVT (deep venous thrombosis) ([place])    History of osteomyelitis    Kidney transplant recipient    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[facility])     Past Surgical History:  Procedure Laterality Date   BIOPSY  04/24/2020   Procedure: BIOPSY;  Surgeon: Doran Stabler, MD;  Location: [place];  Service: Gastroenterology;;   ESOPHAGOGASTRODUODENOSCOPY (EGD) WITH PROPOFOL N/A 04/24/2020   Procedure: ESOPHAGOGASTRODUODENOSCOPY (EGD) WITH PROPOFOL;  Surgeon: Doran Stabler, MD;  Location: [place];  Service: Gastroenterology;  Laterality: N/A;   IR US GUIDE BX ASP/DRAIN  04/30/2020   KIDNEY TRANSPLANT  2001   KIDNEY TRANSPLANT  2014   LEEP     TOTAL ABDOMINAL HYSTERECTOMY     ovaries left in situ    There were no vitals filed for this visit.   Subjective Assessment - 01/24/21 1622     Subjective Patient reports she is doing well with no adverse effects. She reports she still has trouble squatting, she can go down on her knees but just trouble squatting.    Patient Stated Goals Walk without  cane, squat to get stuff off floor or out of low cabinet, improve getting in/out of car    Currently in Pain? No/denies                [facility] PT Assessment - 01/24/21 0001       Assessment   Medical Diagnosis Debility    Referring Provider (PT) Courtney Heys, MD    Onset Date/Surgical Date 04/03/20      Precautions   Precautions Fall      Restrictions   Weight Bearing Restrictions No      Balance Screen   Has the patient fallen in the past 6 months No      Prior Function   Level of Independence Independent      Observation/Other Assessments   Focus on Therapeutic Outcomes (FOTO)  52% functional status      Strength   Right Hip Flexion 3+/5    Right Hip Extension 3+/5    Right Hip ABduction 3+/5      Ambulation/Gait   Gait Comments Compensated trendelenburg on right      6 minute walk test results    Endurance additional comments Unable to assess                      [facility] Adult PT Treatment/Exercise:  Therapeutic Exercise: -  NuStep L5 x 5 min with LE only while taking subjective - SLR 2 x 10 - Bridge 2 x 12 - Hooklying marching with yellow band 2 x 10 - Sidelying hip abduction 2 x 10 - patient needed to perform right sided clamshell after a few reps due to incisional discomfort - Sit<>stand 2 x 10 without UE support - Step-up 6" x 10 (right only) - Split squat to BOSU x 10 each - single UE support at FM bar  Manual Therapy: - n/a  Neuromuscular re-ed: - n/a  Therapeutic Activity: - n/a  Modalities: - n/a  Self Care: - See patient education               PT Education - 01/24/21 1624     Education Details Exam findings, progress toward goals, FOTO, HEP    Person(s) Educated Patient    Methods Explanation;Demonstration;Verbal cues    Comprehension Verbalized understanding;Returned demonstration;Verbal cues required;Need further instruction              PT Short Term Goals - 11/25/20 1017       PT SHORT TERM  GOAL #1   Title Patient will be I with initial HEP to progress with PT    Time 3    Period Weeks    Status Achieved    Target Date 11/08/20      PT SHORT TERM GOAL #2   Title PT will review FOTO with patient by 3rd visit to understand expected progress    Baseline reviewed 3rd visit    Time 3    Period Weeks    Status Achieved    Target Date 11/08/20               PT Long Term Goals - 01/24/21 1700       PT LONG TERM GOAL #1   Title Patient will be I with final HEP to maintain progress from PT    Baseline progressing with HEP    Time 8    Period Weeks    Status On-going    Target Date 03/21/21      PT LONG TERM GOAL #2   Title Patient will report >/= 62% functional status on FOTO to indicate improved functional ability    Baseline 52% functional status    Time 8    Period Weeks    Status On-going    Target Date 03/21/21      PT LONG TERM GOAL #3   Title Patient will demonsrate >/= 4-/5 MMT hip strength and >/= 4+/5 MMT knee strength to improve stair negotiation    Baseline continues to demonstrate gross weakness    Time 8    Period Weeks    Status On-going    Target Date 03/21/21      PT LONG TERM GOAL #4   Title Patient will demonstrate improved 5xSTS to </= 12 sec to reduce fall risk    Baseline 12 seconds    Time 6    Period Weeks    Status Achieved      PT LONG TERM GOAL #5   Title Patient will demonstrate 6MWT >/= 1000 ft without AD to improve community access    Baseline unable to perform due to hip fatigue, pelvic/groin pain    Time 8    Period Weeks    Status Unable to assess                   Plan - 01/24/21  1625     Clinical Impression Statement Patient tolerated therapy well with no adverse effects. She continues to demonstrate gross hip weakness and limitations with squatting and walking tasks. Overall she seems to be progressing toward goals and has returned to work, but does note limitations at work due to difficulty with  squatting. Patient has other comorbidities that are likely contributing to slow progress and will re-cert POC this visit for 8 more weeks at frequency of 1x/week as patient would benefit from continued skilled PT to progress her mobility and strength in order to improve walking ability and maximize her functional mobility.    Examination-Activity Limitations Locomotion Level;Squat;Stairs;Stand;Lift    Examination-Participation Restrictions Cleaning;Occupation;Community Activity;Shop    PT Frequency 1x / week    PT Duration 8 weeks    PT Treatment/Interventions ADLs/Self Care Home Management;Aquatic Therapy;Neuromuscular re-education;Balance training;Therapeutic exercise;Therapeutic activities;Functional mobility training;Stair training;Gait training;Patient/family education;Manual techniques;Passive range of motion;Energy conservation    PT Next Visit Plan Review HEP and progress PRN, progress generalized LE strength, step-ups/squats, balance training    PT Home Exercise Plan [email or web address]    Consulted and Agree with Plan of Care Patient             Patient will benefit from skilled therapeutic intervention in order to improve the following deficits and impairments:  Abnormal gait, Difficulty walking, Decreased strength, Decreased activity tolerance, Decreased endurance, Decreased balance, Impaired flexibility  Visit Diagnosis: Muscle weakness (generalized)  Other abnormalities of gait and mobility     Problem List Patient Active Problem List   Diagnosis Date Noted   History of osteomyelitis 11/12/2020   Leg weakness, bilateral 09/15/2020   Screening for malignant neoplasm of skin 05/31/2020   Exposure to confirmed case of COVID-19 05/18/2020   Immunocompromised state due to drug therapy ([place]) 05/18/2020   Protein-calorie malnutrition, severe 05/14/2020   Chronic kidney disease (CKD), stage IV (severe) (HCC)    Chronic pain syndrome    Cancer associated pain    Pressure injury of  skin 05/06/2020   Debility    Acute on chronic anemia    Thrombocytopenia ([place]) 04/26/2020   Esophageal candidiasis ([place]) 04/26/2020   Normocytic anemia 04/26/2020   CKD (chronic kidney disease) stage 4, GFR 15-29 ml/min ([place]) 04/26/2020   Wound of right groin 04/26/2020   Gastric ulcer without hemorrhage or perforation 04/26/2020   Malnutrition of moderate degree 04/26/2020   Acute osteomyelitis of left pelvic region and thigh ([place])    Pressure ulcer of coccygeal region, stage 2 ([place]) 04/21/2020   Weakness 04/20/2020   Acute deep vein thrombosis (DVT) of femoral vein ([place]) 12/05/2019   Iron deficiency anemia 11/10/2019   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([place]) 11/06/2019   Sprain of anterior talofibular ligament of right ankle 11/22/2016   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 [place], [place], [address] Phone: [phone]   Fax:  [phone]  Name: ZAIAH CREDEUR MRN: [id number] Date of Birth: [birth year]

## 2021-01-25 ENCOUNTER — Ambulatory Visit: Admitting: Infectious Diseases

## 2021-01-27 ENCOUNTER — Ambulatory Visit (INDEPENDENT_AMBULATORY_CARE_PROVIDER_SITE_OTHER): Admitting: Infectious Diseases

## 2021-01-27 ENCOUNTER — Encounter: Payer: Self-pay | Admitting: Infectious Diseases

## 2021-01-27 ENCOUNTER — Other Ambulatory Visit: Payer: Self-pay

## 2021-01-27 VITALS — BP 142/91 | HR 92 | Temp 98.3°F | Wt 140.0 lb

## 2021-01-27 DIAGNOSIS — D696 Thrombocytopenia, unspecified: Secondary | ICD-10-CM

## 2021-01-27 DIAGNOSIS — S31109S Unspecified open wound of abdominal wall, unspecified quadrant without penetration into peritoneal cavity, sequela: Secondary | ICD-10-CM

## 2021-01-27 DIAGNOSIS — Z8739 Personal history of other diseases of the musculoskeletal system and connective tissue: Secondary | ICD-10-CM | POA: Diagnosis not present

## 2021-01-27 DIAGNOSIS — M86152 Other acute osteomyelitis, left femur: Secondary | ICD-10-CM | POA: Diagnosis not present

## 2021-01-27 MED ORDER — AMOXICILLIN-POT CLAVULANATE 875-125 MG PO TABS: 1.0000 | ORAL_TABLET | Freq: Two times a day (BID) | ORAL | 0 refills | Status: AC

## 2021-01-27 NOTE — Progress Notes (Signed)
Patient: Christina Mack  DOB: 06-02-1965 MRN: [id number] PCP: Norina Buzzard, MD     Subjective:  Christina Mack is a 55 y.o. 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 She is on chronic immunosuppression for kidney transplant.  Completed 8 weeks of tedezolid, and ultimately 12 weeks of metronidazole + levaquin in May 2022.    HPI since last OV.  Seen in ER 9/16 for Rt hip pain in the setting of fracture following a fall.  PET scan with resolution of Rt groin lymphadenopathy, areas of persistent hypermetabolism in the region of the rt pubic bone and (lesser degree) the rt ischial tuberosity. Intense activity in the adductor muscle likely reflecting persistent myositis. She states that the wound drainage from the last short course of antibiotics nearly stopped completely. It did start up again about 3 weeks after completion and is now much larger volume, white/yellow and thick mucous like; seems to be coming from deep in the wound.  She has had delays with referrals from PCP to wound care team and restrictions from health insurance.  She has noticed she has limitations with squatting motion - specifically   Denies fevers/chills.    Review of Systems  Constitutional:  Negative for chills and fever.  HENT:  Negative for tinnitus.   Eyes:  Negative for blurred vision and photophobia.  Respiratory:  Negative for cough and sputum production.   Cardiovascular:  Negative for chest pain.  Gastrointestinal:  Negative for diarrhea, nausea and vomiting.  Genitourinary:  Negative for dysuria.  Musculoskeletal:  Positive for falls (May 2022 x 1) and joint pain (3/10 ischial/sacral pain). Negative for back pain and neck pain.  Skin:  Negative for rash.  Neurological:  Positive for weakness (improved significantly). Negative for headaches.     Past Medical History:  Diagnosis Date   Complication of anesthesia    nausea  and vomiting   DVT (deep venous thrombosis) (HCC)    History of osteomyelitis    Kidney transplant recipient    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[facility])     Outpatient Medications Prior to Visit  Medication Sig Dispense Refill   acetaminophen (TYLENOL) 500 MG tablet Take 2 tablets (1,000 mg total) by mouth 3 (three) times daily. 30 tablet 0   apixaban (ELIQUIS) 2.5 MG TABS tablet TAKE 1 TABLET (2.5 MG TOTAL) BY MOUTH DAILY. 60 tablet 0   Cholecalciferol 125 MCG (5000 UT) capsule Take by mouth.     cyclobenzaprine (FLEXERIL) 5 MG tablet Take 1-2 tablets (5-10 mg total) by mouth 2 (two) times daily as needed for muscle spasms. 24 tablet 0   fluticasone (FLONASE) 50 MCG/ACT nasal spray Place 1 spray into both nostrils daily as needed.     pantoprazole (PROTONIX) 40 MG tablet TAKE 1 TABLET (40 MG TOTAL) BY MOUTH 2 (TWO) TIMES DAILY BEFORE A MEAL. 60 tablet 0   predniSONE (DELTASONE) 5 MG tablet Take 5 mg by mouth daily with breakfast.     PROGRAF 1 MG capsule Take 1 mg by mouth 2 (two) times daily. 2mg  in morning 1mg  at night     No facility-administered medications prior to visit.     Allergies  Allergen Reactions   Oxycodone-Acetaminophen Itching, Nausea Only, Other (See Comments), Nausea And Vomiting and Rash    Other Reaction: HA Other Reaction: HA    Zosyn [Piperacillin Sod-Tazobactam So] Other (See Comments)    Thrombocytopenia   Hydromorphone Nausea  And Vomiting, Rash and Other (See Comments)   Amlodipine Other (See Comments)    Ankle swelling   Codeine     vomiting    Social History   Tobacco Use   Smoking status: Never   Smokeless tobacco: Never  Vaping Use   Vaping Use: Never used  Substance Use Topics   Alcohol use: Not Currently   Drug use: Never     Objective:   Vitals:   01/27/21 1550  BP: (!) 142/91  Pulse: 92  Temp: 98.3 F (36.8 C)  TempSrc: Temporal  Weight: 140 lb (63.5 kg)   Body mass index is 23.3 kg/m.  Physical Exam Constitutional:      Appearance: She  is well-developed.     Comments: Seated comfortably in chair.   HENT:     Mouth/Throat:     Mouth: No oral lesions.     Dentition: Normal dentition. No dental abscesses.     Pharynx: No oropharyngeal exudate.  Cardiovascular:     Rate and Rhythm: Normal rate and regular rhythm.     Heart sounds: Normal heart sounds.  Pulmonary:     Effort: Pulmonary effort is normal.     Breath sounds: Normal breath sounds.  Abdominal:     General: There is no distension.     Palpations: Abdomen is soft.     Tenderness: There is no abdominal tenderness.  Musculoskeletal:     Comments: She can squat down assisted but not without effort. No pain per say but has balance problems maintaining that.   Lymphadenopathy:     Cervical: No cervical adenopathy.  Skin:    General: Skin is warm and dry.     Findings: No rash.  Neurological:     Mental Status: She is alert and oriented to person, place, and time.  Psychiatric:        Judgment: Judgment normal.     Comments: In good spirits today and engaged in care discussion    Lab Results: Lab Results  Component Value Date   WBC 6.7 06/08/2020   HGB 7.8 (L) 06/08/2020   HCT 23.8 (L) 06/08/2020   MCV 90.2 06/08/2020   PLT 177 06/08/2020    Lab Results  Component Value Date   CREATININE 2.93 (H) 06/08/2020   BUN 28 (H) 06/08/2020   NA 133 (L) 06/08/2020   K 5.5 (H) 06/08/2020   CL 99 06/08/2020   CO2 26 06/08/2020    Lab Results  Component Value Date   ALT 12 05/06/2020   AST 17 05/06/2020   ALKPHOS 213 (H) 05/06/2020   BILITOT 0.8 05/06/2020     Assessment & Plan:   Problem List Items Addressed This Visit       Unprioritized   Thrombocytopenia ([place])    Watch very very closely on Augmentin. She tolerated short course well. We will have her return in 10-14d to check a CBC and likely again if we need to treat longer.       Wound of right groin - Primary   Relevant Orders   CBC with Differential/Platelet   History of osteomyelitis     PET scan and MRI results reviewed with Claiborne Billings again - she had nearly complete cessation of drainage from wound with initiation of Augmentin. Given she has abnormalities noted on both scans overlying the right pubic bone I think it is reasonable to consider a longer course of treatment of the augmentin with 4-6 weeks of sequential therapy. She would like  to avoid the levaquin/flagyl combo as she has done in the past d/t very unpleasant GI side effects (nausea/vomiting/weight loss).  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       RESOLVED: Acute osteomyelitis of left pelvic region and thigh ([place])    Resolved on MRI and nothing lit up on PET - we talked about this likely being cured from previous antibiotic treatments.       [name], MSN, NP-C [facility] [place] Pager: [phone] Office: [phone]  02/01/21  3:26 PM

## 2021-01-27 NOTE — Patient Instructions (Addendum)
Lets plan a 4 week course of the antibiotic we had you on before - Augmentin.   If you would please come back to check a blood count for me in 10 - 12 days after you take it so we can monitor for side effects. If you notice any red spots on the arms please stop taking it and let me know.   Would like to check in with you again in 2 months to see how you are doing. Hopeful you can get to wound care by then!

## 2021-01-31 ENCOUNTER — Encounter: Payer: TRICARE For Life (TFL) | Admitting: Physical Therapy

## 2021-02-01 NOTE — Assessment & Plan Note (Addendum)
PET scan and MRI results reviewed with Christina Mack again - she had nearly complete cessation of drainage from wound with initiation of Augmentin. Given she has abnormalities noted on both scans overlying the right pubic bone I think it is reasonable to consider a longer course of treatment of the augmentin with 4-6 weeks of sequential therapy. She would like to avoid the levaquin/flagyl combo as she has done in the past d/t very unpleasant GI side effects (nausea/vomiting/weight loss).  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

## 2021-02-01 NOTE — Assessment & Plan Note (Signed)
Resolved on MRI and nothing lit up on PET - we talked about this likely being cured from previous antibiotic treatments.

## 2021-02-01 NOTE — Assessment & Plan Note (Signed)
Watch very very closely on Augmentin. She tolerated short course well. We will have her return in 10-14d to check a CBC and likely again if we need to treat longer.

## 2021-02-02 ENCOUNTER — Other Ambulatory Visit: Payer: Self-pay

## 2021-02-02 ENCOUNTER — Ambulatory Visit: Attending: Physical Medicine and Rehabilitation | Admitting: Physical Therapy

## 2021-02-02 ENCOUNTER — Encounter: Payer: Self-pay | Admitting: Physical Therapy

## 2021-02-02 DIAGNOSIS — M6281 Muscle weakness (generalized): Secondary | ICD-10-CM | POA: Insufficient documentation

## 2021-02-02 DIAGNOSIS — R2689 Other abnormalities of gait and mobility: Secondary | ICD-10-CM | POA: Diagnosis present

## 2021-02-02 NOTE — Therapy (Signed)
[facility], [place], [address] Phone: [phone]   Fax:  [phone]  Physical Therapy Treatment  Patient Details  Name: Christina Mack MRN: [id number] Date of Birth: February 16, 1966 Referring Provider (PT): Courtney Heys, MD   Encounter Date: 02/02/2021   PT End of Session - 02/02/21 1705     Visit Number 11    Number of Visits 18    Date for PT Re-Evaluation 03/21/21    Authorization Type TRICARE    PT Start Time 1700    PT Stop Time 1740    PT Time Calculation (min) 40 min    Activity Tolerance Patient tolerated treatment well    Behavior During Therapy [facility] for tasks assessed/performed             Past Medical History:  Diagnosis Date   Complication of anesthesia    nausea and vomiting   DVT (deep venous thrombosis) ([place])    History of osteomyelitis    Kidney transplant recipient    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[facility])     Past Surgical History:  Procedure Laterality Date   BIOPSY  04/24/2020   Procedure: BIOPSY;  Surgeon: Doran Stabler, MD;  Location: [place];  Service: Gastroenterology;;   ESOPHAGOGASTRODUODENOSCOPY (EGD) WITH PROPOFOL N/A 04/24/2020   Procedure: ESOPHAGOGASTRODUODENOSCOPY (EGD) WITH PROPOFOL;  Surgeon: Doran Stabler, MD;  Location: [place];  Service: Gastroenterology;  Laterality: N/A;   IR US GUIDE BX ASP/DRAIN  04/30/2020   KIDNEY TRANSPLANT  2001   KIDNEY TRANSPLANT  2014   LEEP     TOTAL ABDOMINAL HYSTERECTOMY     ovaries left in situ    There were no vitals filed for this visit.   Subjective Assessment - 02/02/21 1702     Subjective Patient reports she is doing well with no new issues. She saw the primary care and will hopefully get back to wound care.    Patient Stated Goals Walk without cane, squat to get stuff off floor or out of low cabinet, improve getting in/out of car    Currently in Pain? No/denies                [facility] PT Assessment -  02/02/21 0001       6 minute walk test results    Aerobic Endurance Distance Walked 1295    Endurance additional comments without assistive device                   [facility] Adult PT Treatment/Exercise:   Therapeutic Exercise: NuStep L5 x 5 min with LE only while taking subjective Sit<>stand 2 x 10 without UE support - tapping chair SLR 2 x 10 each - partial range Bridge 2 x 10 Sidelying hip abduction 2 x 10 each Split squat x 10 each - single UE support at counter Leg press (omega) 35# 2 x 10 Knee extension machine 15# 2 x 10 Hip abduction machine 12.5# 2 x 10 each   Manual Therapy: - n/a   Neuromuscular re-ed: - n/a   Therapeutic Activity: - n/a   Modalities: - n/a   Self Care: - n/a                 PT Education - 02/02/21 1704     Education Details HEP    Mack(s) Educated Patient    Methods Explanation;Demonstration;Verbal cues    Comprehension Verbalized understanding;Returned demonstration;Verbal cues required;Need further instruction  PT Short Term Goals - 11/25/20 1017       PT SHORT TERM GOAL #1   Title Patient will be I with initial HEP to progress with PT    Time 3    Period Weeks    Status Achieved    Target Date 11/08/20      PT SHORT TERM GOAL #2   Title PT will review FOTO with patient by 3rd visit to understand expected progress    Baseline reviewed 3rd visit    Time 3    Period Weeks    Status Achieved    Target Date 11/08/20               PT Long Term Goals - 02/02/21 1718       PT LONG TERM GOAL #1   Title Patient will be I with final HEP to maintain progress from PT    Baseline progressing with HEP    Time 8    Period Weeks    Status On-going      PT LONG TERM GOAL #2   Title Patient will report >/= 62% functional status on FOTO to indicate improved functional ability    Baseline 52% functional status    Time 8    Period Weeks    Status On-going      PT LONG TERM GOAL #3    Title Patient will demonsrate >/= 4-/5 MMT hip strength and >/= 4+/5 MMT knee strength to improve stair negotiation    Baseline continues to demonstrate gross weakness    Time 8    Period Weeks    Status On-going      PT LONG TERM GOAL #4   Title Patient will demonstrate improved 5xSTS to </= 12 sec to reduce fall risk    Baseline 12 seconds    Time 6    Period Weeks    Status Achieved      PT LONG TERM GOAL #5   Title Patient will demonstrate 6MWT >/= 1000 ft without AD to improve community access    Baseline 1295 ft    Time 8    Period Weeks    Status Achieved                   Plan - 02/02/21 1705     Clinical Impression Statement Patient tolerated therapy well with no adverse effects. She reports improvement in overall mobility and she demonstrates an improvement in walking with 6MWT. Continued focus on progressing strength and working on squatting to improve performance at work. Patient would benefit from continued skilled PT to progress her mobility and strength in order to improve walking ability and maximize her functional mobility.    PT Treatment/Interventions ADLs/Self Care Home Management;Aquatic Therapy;Neuromuscular re-education;Balance training;Therapeutic exercise;Therapeutic activities;Functional mobility training;Stair training;Gait training;Patient/family education;Manual techniques;Passive range of motion;Energy conservation    PT Next Visit Plan Review HEP and progress PRN, progress generalized LE strength, step-ups/squats, balance training    PT Home Exercise Plan [id number]    Consulted and Agree with Plan of Care Patient             Patient will benefit from skilled therapeutic intervention in order to improve the following deficits and impairments:  Abnormal gait, Difficulty walking, Decreased strength, Decreased activity tolerance, Decreased endurance, Decreased balance, Impaired flexibility  Visit Diagnosis: Muscle weakness  (generalized)  Other abnormalities of gait and mobility     Problem List Patient Active Problem List   Diagnosis Date Noted  History of osteomyelitis 11/12/2020   Leg weakness, bilateral 09/15/2020   Screening for malignant neoplasm of skin 05/31/2020   Exposure to confirmed case of COVID-19 05/18/2020   Immunocompromised state due to drug therapy ([place]) 05/18/2020   Protein-calorie malnutrition, severe 05/14/2020   Chronic kidney disease (CKD), stage IV (severe) (HCC)    Chronic pain syndrome    Cancer associated pain    Pressure injury of skin 05/06/2020   Debility    Acute on chronic anemia    Thrombocytopenia ([place]) 04/26/2020   Normocytic anemia 04/26/2020   CKD (chronic kidney disease) stage 4, GFR 15-29 ml/min (HCC) 04/26/2020   Wound of right groin 04/26/2020   Gastric ulcer without hemorrhage or perforation 04/26/2020   Malnutrition of moderate degree 04/26/2020   Pressure ulcer of coccygeal region, stage 2 ([place]) 04/21/2020   Weakness 04/20/2020   Acute deep vein thrombosis (DVT) of femoral vein ([place]) 12/05/2019   Iron deficiency anemia 11/10/2019   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([place]) 11/06/2019   Sprain of anterior talofibular ligament of right ankle 11/22/2016   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   Aftercare following organ transplant 02/24/2013   Immunosuppression ([place]) 02/24/2013   Nausea 02/24/2013   Postoperative abdominal pain 02/24/2013   Ureteral stent displacement ([place]) 02/24/2013   Kidney transplant status 11/22/2012   HTN (hypertension) 11/22/2012   Hyperparathyroidism, 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

## 2021-02-07 ENCOUNTER — Other Ambulatory Visit

## 2021-02-09 ENCOUNTER — Ambulatory Visit: Admitting: Physical Therapy

## 2021-02-09 ENCOUNTER — Encounter: Payer: Self-pay | Admitting: Gynecologic Oncology

## 2021-02-09 ENCOUNTER — Other Ambulatory Visit: Payer: Self-pay

## 2021-02-09 ENCOUNTER — Inpatient Hospital Stay: Attending: Gynecologic Oncology | Admitting: Gynecologic Oncology

## 2021-02-09 VITALS — BP 142/94 | HR 87 | Temp 98.2°F | Resp 16 | Ht 65.0 in | Wt 141.6 lb

## 2021-02-09 DIAGNOSIS — Z7901 Long term (current) use of anticoagulants: Secondary | ICD-10-CM | POA: Insufficient documentation

## 2021-02-09 DIAGNOSIS — C519 Malignant neoplasm of vulva, unspecified: Secondary | ICD-10-CM

## 2021-02-09 DIAGNOSIS — Z940 Kidney transplant status: Secondary | ICD-10-CM | POA: Diagnosis not present

## 2021-02-09 DIAGNOSIS — Z8544 Personal history of malignant neoplasm of other female genital organs: Secondary | ICD-10-CM | POA: Insufficient documentation

## 2021-02-09 DIAGNOSIS — Z923 Personal history of irradiation: Secondary | ICD-10-CM | POA: Insufficient documentation

## 2021-02-09 DIAGNOSIS — Z86718 Personal history of other venous thrombosis and embolism: Secondary | ICD-10-CM | POA: Diagnosis not present

## 2021-02-09 NOTE — Progress Notes (Signed)
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 Juleen China)  Initial exam by Dr. Juleen China on 09/08/19: right vulva was completely involved by a large necrotic mass extending from the inguinal crease towards the anus measuring 12x5cm. It involved the distal vagina and extended to involve the left labia as well. It was difficult to evaluate her vaginal involvement due to pain.   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([place])  09/10/2019 Initial Biopsy   Vulvar biopsy: at least VIN3, biopsy demonstrates extreme ulceration, inflammation and granulation tissue. Underlying invasion very difficult to rule out.   09/16/2019 Imaging   PET: IMPRESSION:  1.  Large hypermetabolic mass in the right side of the perineum, consistent with known vulvar malignancy.  2.  Hypermetabolic right groin lymphadenopathy is consistent with nodal involvement.  3.  Tiny right apical pulmonary nodule is below PET resolution.    09/16/2019 Initial Diagnosis   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([place])   11/03/2019 - 01/15/2020 Radiation Therapy   IMRT. She received 45GY/25 fractions to the pelvic and inguinal nodes and vulvar mass. The vulvar mass and enlarged inguinal nodes received a boost dose of 1800cGY/10 fractions for total dose of 63Gy/35 fractions.  Treated in [place]. Diagnosed with DVT several weeks into treatment. Missed treatments due to feeling poorly. Received 4 Cisplatin (stopped due to elevated Cr) - 8/5, 8/11, 8/18, 8/25   04/03/2020 Imaging   CT A/P w/o contrast: 1.  Redemonstration of the large right perineal mass, with new extensive surrounding soft tissue stranding and gas  which extends along the bilateral anterior pubic bones and anterior pelvis.  I'm unable to exclude developing necrotizing fasciitis.  No definite drainable abscess collection is identified.  2.  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  3.  Diffuse edema of the pelvic mesentery with mild presacral fluid, concerning for mesenteritis.  No definite drainable intraperitoneal fluid collection identified.  4.  Suspected bowel ileus or gastroenteritis.  5.  Moderately distended gallbladder with heterogeneous dependent material suggestive of sludge and possible gallstones.  6.  Hepatosplenomegaly.  7.  Suspected anemia.  8.  Moderate diffuse urinary bladder wall thickening suggesting chronic cystitis.    04/20/2020 - 05/05/2020 Hospital Admission   Admitted in the setting of right groin wound. Had been admitted to [facility] 1/2 with concerns for soft tissue infection. Started on vanc/zosyn --> oral levo/flagyl to complete 3 weeks of treatment ( EOT 1/22). Pelvic MRI at [facility] showed multilocular abscess of left pelvis, myositis, nondisplaced fracture of inferior right pubic ramus, concern for osteomytlitis. IR drainage of abscess was performed - VRE on culture. ID recommended IV dapto --> tedezolid oupatient (as well as levo + flagyl) for 6 weeks total.    04/26/2020 Imaging   MRI pelvis w/o contrast: IMPRESSION: 1. Large area of ulceration with sinus tract seen within the right labial fold with diffuse surrounding phlegmon and extensive myositis in the bilateral adductor and anterior muscular compartments of both thighs. 2. There is a multilocular fluid collection extending in the inferior left pelvic soft tissues surrounding the pubic rami and extending to the anterior upper left thigh, consistent with a multilocular abscess. 3. Nondisplaced fracture of the inferior  right pubic rami with findings suggestive osteomyelitis involving the pubic rami and anterior pubic  symphysis. 4. Findings suggestive of osteomyelitis involving the anterior left pubic symphysis.   05/05/2020 - 05/17/2020 Hospital Admission   Admitted for inpatient rehab given debility.   07/27/2020 Imaging   PET: Significant decrease in size and avidity of vulvar lesion. Air and soft tissue inflammation. Mild PET hypermetabolic activity. Resolution of inguinal LN activity. 53mm LU love groundglass nodule, mild hypermetabolic activity.   09/14/2020 Imaging   CT Chest w/o contrast: IMPRESSION:  1.  Resolution of left upper lobe nodule and associated groundglass opacity.  2.  Stable 5 mm nodular opacity right upper lobe.  3.  Stable 3 mm nodule right lower lobe.      Interval History: Patient presents today for surveillance visit.  She notes overall doing well.  She recently was seen in the infectious disease clinic at the end of October and secondary to increased discharge, has been placed on a 2-week course of antibiotics.  Her drainage has now almost stopped again.  She continued to have delays with referral from her PCP and is still waiting from approval to be able to see wound care and hopefully be considered for hyperbaric oxygen treatment.  She is continued with physical therapy which is going well.  She has only 1 area along her buttock that causes pain intermittently that she thinks is related to irritation from wearing a pad all the time.  Has dry skin near her buttock also related to wearing a pad.  She endorses a good appetite without nausea or emesis.  She reports regular bowel and bladder function.  Past Medical/Surgical History: Past Medical History:  Diagnosis Date   Complication of anesthesia    nausea and vomiting   DVT (deep venous thrombosis) (HCC)    History of osteomyelitis    Kidney transplant recipient    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[facility])     Past Surgical History:  Procedure Laterality Date   BIOPSY  04/24/2020   Procedure: BIOPSY;  Surgeon: Doran Stabler, MD;   Location: [place];  Service: Gastroenterology;;   ESOPHAGOGASTRODUODENOSCOPY (EGD) WITH PROPOFOL N/A 04/24/2020   Procedure: ESOPHAGOGASTRODUODENOSCOPY (EGD) WITH PROPOFOL;  Surgeon: Doran Stabler, MD;  Location: [place];  Service: Gastroenterology;  Laterality: N/A;   IR US GUIDE BX ASP/DRAIN  04/30/2020   KIDNEY TRANSPLANT  2001   KIDNEY TRANSPLANT  2014   LEEP     TOTAL ABDOMINAL HYSTERECTOMY     ovaries left in situ    Family History  Problem Relation Age of Onset   Cerebral aneurysm Mother    AAA (abdominal aortic aneurysm) Brother    Breast cancer Paternal Aunt    Heart attack Maternal Grandmother    Brain cancer Maternal Grandfather    Colon cancer Neg Hx    Ovarian cancer Neg Hx    Endometrial cancer Neg Hx    Pancreatic cancer Neg Hx    Prostate cancer Neg Hx     Social History   Socioeconomic History   Marital status: Married    Spouse name: Not on file   Number of children: Not on file   Years of education: Not on file   Highest education level: Not on file  Occupational History   Not on file  Tobacco Use   Smoking status: Never   Smokeless tobacco: Never  Vaping Use   Vaping Use: Never used  Substance and Sexual Activity   Alcohol use:  Not Currently   Drug use: Never   Sexual activity: Not Currently  Other Topics Concern   Not on file  Social History Narrative   Not on file   Social Determinants of Health   Financial Resource Strain: Not on file  Food Insecurity: Not on file  Transportation Needs: Not on file  Physical Activity: Not on file  Stress: Not on file  Social Connections: Not on file    Current Medications:  Current Outpatient Medications:    acetaminophen (TYLENOL) 500 MG tablet, Take 2 tablets (1,000 mg total) by mouth 3 (three) times daily., Disp: 30 tablet, Rfl: 0   amoxicillin-clavulanate (AUGMENTIN) 875-125 MG tablet, Take 1 tablet by mouth 2 (two) times daily for 28 days., Disp: 56 tablet, Rfl: 0   apixaban  (ELIQUIS) 2.5 MG TABS tablet, TAKE 1 TABLET (2.5 MG TOTAL) BY MOUTH DAILY., Disp: 60 tablet, Rfl: 0   fluticasone (FLONASE) 50 MCG/ACT nasal spray, Place 1 spray into both nostrils daily as needed., Disp: , Rfl:    pantoprazole (PROTONIX) 40 MG tablet, TAKE 1 TABLET (40 MG TOTAL) BY MOUTH 2 (TWO) TIMES DAILY BEFORE A MEAL., Disp: 60 tablet, Rfl: 0   predniSONE (DELTASONE) 5 MG tablet, Take 5 mg by mouth daily with breakfast., Disp: , Rfl:    PROGRAF 1 MG capsule, Take 1 mg by mouth 2 (two) times daily. 2mg  in morning 1mg  at night, Disp: , Rfl:    Cholecalciferol 125 MCG (5000 UT) capsule, Take by mouth. (Patient not taking: Reported on 02/09/2021), Disp: , Rfl:    cyclobenzaprine (FLEXERIL) 5 MG tablet, Take 1-2 tablets (5-10 mg total) by mouth 2 (two) times daily as needed for muscle spasms. (Patient not taking: Reported on 02/09/2021), Disp: 24 tablet, Rfl: 0  Review of Systems: Denies appetite changes, fevers, chills, fatigue, unexplained weight changes. Denies hearing loss, neck lumps or masses, mouth sores, ringing in ears or voice changes. Denies cough or wheezing.  Denies shortness of breath. Denies chest pain or palpitations. Denies leg swelling. Denies abdominal distention, pain, blood in stools, constipation, diarrhea, nausea, vomiting, or early satiety. Denies pain with intercourse, dysuria, frequency, hematuria or incontinence. Denies hot flashes, pelvic pain, vaginal bleeding or vaginal discharge.   Denies joint pain, back pain or muscle pain/cramps. Denies itching, rash, or wounds. Denies dizziness, headaches, numbness or seizures. Denies swollen lymph nodes or glands, denies easy bruising or bleeding. Denies anxiety, depression, confusion, or decreased concentration.  Physical Exam: BP (!) 142/94 (BP Location: Right Arm, Patient Position: Sitting)   Pulse 87   Temp 98.2 F (36.8 C) (Tympanic)   Resp 16   Ht 5\' 5"  (1.651 m)   Wt 141 lb 9.6 oz (64.2 kg)   SpO2 100%   BMI  23.56 kg/m  General: Alert, oriented, no acute distress. HEENT: Normocephalic, atraumatic, sclera anicteric. Chest: Clear to auscultation bilaterally.  Unlabored breathing on room air. Cardiovascular: Regular rate and rhythm, no murmurs. Abdomen: soft, nontender.  Normoactive bowel sounds.  No masses or hepatosplenomegaly appreciated.   Extremities: Grossly normal range of motion.  Warm, well perfused.  No edema bilaterally. Lymphatics: No cervical, supraclavicular, or left inguinal adenopathy. GU: External female genitalia with similar appearance from last visit.  She continues to have an open wound along the right groin with complete lack of labia majora and minora on that side.  There is continued edema of the left labia although less.  Urethra is normal in appearance.  Single-digit exam notes no induration or masses  within the vagina.  There is a small amount of fibrinous exudate over bone within her open wound, no purulence or bleeding.  There is some induration as well as irritation of her buttock region bilaterally.  Radiation changes of the vulva noted.  Laboratory & Radiologic Studies: None new  Assessment & Plan: Christina Mack is a 55 y.o. woman with Stage IIIC vulvar cancer presenting for surveillance visit today with ongoing open wound awaiting hyperbaric oxygen therapy.  Overall doing well. No evidence of disease on her exam. Will plan follow-up imaging in 3 months (either with PET or MRI, can get the latter without referral from PCP).  Patient still waiting on referral from PCP for wound care and hyperbaric oxygen treatment.   Will continue with visits every 3 months per NCCN surveillance recommendations. Patient aware of signs and symptoms of care recurrence.   38 minutes of total time was spent for this patient encounter, including preparation, face-to-face counseling with the patient and coordination of care, and documentation of the encounter.  Jeral Pinch, MD   Division of Gynecologic Oncology  Department of Obstetrics and Gynecology  [facility]

## 2021-02-09 NOTE — Patient Instructions (Signed)
It was good to see you today.  Please let me know if I can help with anything in regards to the hyperbaric treatment.  I will see you in 3 months unless something changes.  At that visit, we will discuss the need for additional imaging.

## 2021-02-11 ENCOUNTER — Ambulatory Visit: Admitting: Gynecologic Oncology

## 2021-02-16 ENCOUNTER — Encounter: Payer: Self-pay | Admitting: Physical Therapy

## 2021-02-16 ENCOUNTER — Other Ambulatory Visit: Payer: Self-pay

## 2021-02-16 ENCOUNTER — Ambulatory Visit: Admitting: Physical Therapy

## 2021-02-16 DIAGNOSIS — M6281 Muscle weakness (generalized): Secondary | ICD-10-CM | POA: Diagnosis not present

## 2021-02-16 DIAGNOSIS — R2689 Other abnormalities of gait and mobility: Secondary | ICD-10-CM

## 2021-02-16 NOTE — Therapy (Signed)
[place], [place], [address] Phone: [phone]   Fax:  [phone]  Physical Therapy Treatment  Patient Details  Name: Christina Mack MRN: [id number] Date of Birth: [birth year] Referring Provider (PT): Courtney Heys, MD   Encounter Date: 02/16/2021   PT End of Session - 02/16/21 1554     Visit Number 12    Number of Visits 18    Date for PT Re-Evaluation 03/21/21    Authorization Type TRICARE    PT Start Time 1610    PT Stop Time 1650    PT Time Calculation (min) 40 min    Activity Tolerance Patient tolerated treatment well    Behavior During Therapy [facility] for tasks assessed/performed             Past Medical History:  Diagnosis Date   Complication of anesthesia    nausea and vomiting   DVT (deep venous thrombosis) ([place])    History of osteomyelitis    Kidney transplant recipient    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[facility])     Past Surgical History:  Procedure Laterality Date   BIOPSY  04/24/2020   Procedure: BIOPSY;  Surgeon: Doran Stabler, MD;  Location: [place];  Service: Gastroenterology;;   ESOPHAGOGASTRODUODENOSCOPY (EGD) WITH PROPOFOL N/A 04/24/2020   Procedure: ESOPHAGOGASTRODUODENOSCOPY (EGD) WITH PROPOFOL;  Surgeon: Doran Stabler, MD;  Location: [place];  Service: Gastroenterology;  Laterality: N/A;   IR US GUIDE BX ASP/DRAIN  04/30/2020   KIDNEY TRANSPLANT  2001   KIDNEY TRANSPLANT  2014   LEEP     TOTAL ABDOMINAL HYSTERECTOMY     ovaries left in situ    There were no vitals filed for this visit.   Subjective Assessment - 02/16/21 1613     Subjective Patient reports she is doing well. She still has trouble squatting at work. She has been trying to take the steps at work as much as she can.    Patient Stated Goals Walk without cane, squat to get stuff off floor or out of low cabinet, improve getting in/out of car    Currently in Pain? No/denies                [facility]  PT Assessment - 02/16/21 0001       Strength   Right Hip Flexion 4-/5    Right Hip ABduction 4-/5    Left Hip ABduction 4-/5                  [facility] Adult PT Treatment/Exercise:   Therapeutic Exercise: NuStep L6 x 5 min with LE only while taking subjective Leg press (omega) 45# 3 x 15 Knee extension machine 20# 3 x 12 Hip abduction machine 12.5# 2 x 12 each Heel-toe raises 2 x 20 SLR 2 x 10 each Bridge 2 x 10   Manual Therapy: - n/a   Neuromuscular re-ed: SLS 3 x 30 sec each   Therapeutic Activity: - n/a   Modalities: - n/a   Self Care: - n/a                  PT Education - 02/16/21 1554     Education Details HEP    Person(s) Educated Patient    Methods Explanation;Demonstration;Verbal cues    Comprehension Verbalized understanding;Returned demonstration;Verbal cues required;Need further instruction              PT Short Term Goals - 11/25/20 1017  PT SHORT TERM GOAL #1   Title Patient will be I with initial HEP to progress with PT    Time 3    Period Weeks    Status Achieved    Target Date 11/08/20      PT SHORT TERM GOAL #2   Title PT will review FOTO with patient by 3rd visit to understand expected progress    Baseline reviewed 3rd visit    Time 3    Period Weeks    Status Achieved    Target Date 11/08/20               PT Long Term Goals - 02/02/21 1718       PT LONG TERM GOAL #1   Title Patient will be I with final HEP to maintain progress from PT    Baseline progressing with HEP    Time 8    Period Weeks    Status On-going      PT LONG TERM GOAL #2   Title Patient will report >/= 62% functional status on FOTO to indicate improved functional ability    Baseline 52% functional status    Time 8    Period Weeks    Status On-going      PT LONG TERM GOAL #3   Title Patient will demonsrate >/= 4-/5 MMT hip strength and >/= 4+/5 MMT knee strength to improve stair negotiation    Baseline continues to  demonstrate gross weakness    Time 8    Period Weeks    Status On-going      PT LONG TERM GOAL #4   Title Patient will demonstrate improved 5xSTS to </= 12 sec to reduce fall risk    Baseline 12 seconds    Time 6    Period Weeks    Status Achieved      PT LONG TERM GOAL #5   Title Patient will demonstrate 6MWT >/= 1000 ft without AD to improve community access    Baseline 1295 ft    Time 8    Period Weeks    Status Achieved                   Plan - 02/16/21 1613     Clinical Impression Statement Patient tolerated therapy well with no adverse effects. Therapy focused on progressing strength as tolerated. She is progressing with her exercises and tolerating increases in reasistance. She does continue to demonstrate gross hip strength deficit, especially on right, that limits functional mobility at work. Incorporated balance training this visit with good tolerance. She does exhibit balance deficit greater on right with need for occasional UE support to maintain single leg balance. Patient would benefit from continued skilled PT to progress her mobility and strength in order to improve walking ability and maximize her functional mobility.    PT Treatment/Interventions ADLs/Self Care Home Management;Aquatic Therapy;Neuromuscular re-education;Balance training;Therapeutic exercise;Therapeutic activities;Functional mobility training;Stair training;Gait training;Patient/family education;Manual techniques;Passive range of motion;Energy conservation    PT Next Visit Plan Review HEP and progress PRN, progress generalized LE strength, step-ups/squats, balance training    PT Home Exercise Plan [id number]    Consulted and Agree with Plan of Care Patient             Patient will benefit from skilled therapeutic intervention in order to improve the following deficits and impairments:  Abnormal gait, Difficulty walking, Decreased strength, Decreased activity tolerance, Decreased endurance,  Decreased balance, Impaired flexibility  Visit Diagnosis: Muscle weakness (generalized)  Other abnormalities of gait and mobility     Problem List Patient Active Problem List   Diagnosis Date Noted   History of osteomyelitis 11/12/2020   Leg weakness, bilateral 09/15/2020   Screening for malignant neoplasm of skin 05/31/2020   Exposure to confirmed case of COVID-19 05/18/2020   Immunocompromised state due to drug therapy ([place]) 05/18/2020   Protein-calorie malnutrition, severe 05/14/2020   Chronic kidney disease (CKD), stage IV (severe) (HCC)    Chronic pain syndrome    Cancer associated pain    Pressure injury of skin 05/06/2020   Debility    Acute on chronic anemia    Thrombocytopenia ([place]) 04/26/2020   Normocytic anemia 04/26/2020   CKD (chronic kidney disease) stage 4, GFR 15-29 ml/min ([place]) 04/26/2020   Wound of right groin 04/26/2020   Gastric ulcer without hemorrhage or perforation 04/26/2020   Malnutrition of moderate degree 04/26/2020   Pressure ulcer of coccygeal region, stage 2 ([place]) 04/21/2020   Weakness 04/20/2020   Acute deep vein thrombosis (DVT) of femoral vein ([place]) 12/05/2019   Iron deficiency anemia 11/10/2019   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([place]) 11/06/2019   Sprain of anterior talofibular ligament of right ankle 11/22/2016   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 [place], [place], [address] Phone: [phone]   Fax:   [phone]  Name: SHANYA FERRISS MRN: [id number] Date of Birth: 1965-10-19

## 2021-02-21 ENCOUNTER — Other Ambulatory Visit: Payer: Self-pay

## 2021-02-21 ENCOUNTER — Encounter: Attending: Physical Medicine and Rehabilitation | Admitting: Physical Medicine and Rehabilitation

## 2021-02-21 ENCOUNTER — Encounter: Payer: Self-pay | Admitting: Physical Medicine and Rehabilitation

## 2021-02-21 VITALS — BP 161/96 | HR 74 | Ht 65.0 in | Wt 149.4 lb

## 2021-02-21 DIAGNOSIS — Z8739 Personal history of other diseases of the musculoskeletal system and connective tissue: Secondary | ICD-10-CM

## 2021-02-21 DIAGNOSIS — R29898 Other symptoms and signs involving the musculoskeletal system: Secondary | ICD-10-CM | POA: Diagnosis present

## 2021-02-21 NOTE — Progress Notes (Signed)
Subjective:    Patient ID: Christina Mack, female    DOB: Jun 21, 1965, 55 y.o.   MRN: [id number]  HPI  Pt is a 55 yr old female with vulvar cancer and osteomyelitis- s/p admission for debility-  Here for f/u on LE weakness and debility-  Has severe debility- slowing getting to point to walk again with quad cane- Here for f/u on debility/weakness.   Went back to work- got tired of sitting at home.   Works in [employer]- PTA- working 35 hours/week.   Strength doing well/fine.   Fatiguing early-  Stair/steps are still hard- up harder than down.   Still squatting is bothering her- can get on knees and look into cabinets- cannot stay in squatting position.   Driving- no issues no assistive device.   Not on any pain meds    Pain Inventory Average Pain 2 Pain Right Now 0 My pain is dull  In the last 24 hours, has pain interfered with the following? General activity 0 Relation with others 0 Enjoyment of life 0 What TIME of day is your pain at its worst? evening Sleep (in general) Good  Pain is worse with: sitting Pain improves with: rest Relief from Meds:  na  Family History  Problem Relation Age of Onset   Cerebral aneurysm Mother    AAA (abdominal aortic aneurysm) Brother    Breast cancer Paternal Aunt    Heart attack Maternal Grandmother    Brain cancer Maternal Grandfather    Colon cancer Neg Hx    Ovarian cancer Neg Hx    Endometrial cancer Neg Hx    Pancreatic cancer Neg Hx    Prostate cancer Neg Hx    Social History   Socioeconomic History   Marital status: Married    Spouse name: Not on file   Number of children: Not on file   Years of education: Not on file   Highest education level: Not on file  Occupational History   Not on file  Tobacco Use   Smoking status: Never   Smokeless tobacco: Never  Vaping Use   Vaping Use: Never used  Substance and Sexual Activity   Alcohol use: Not Currently   Drug use: Never   Sexual activity: Not Currently  Other  Topics Concern   Not on file  Social History Narrative   Not on file   Social Determinants of Health   Financial Resource Strain: Not on file  Food Insecurity: Not on file  Transportation Needs: Not on file  Physical Activity: Not on file  Stress: Not on file  Social Connections: Not on file   Past Surgical History:  Procedure Laterality Date   BIOPSY  04/24/2020   Procedure: BIOPSY;  Surgeon: Doran Stabler, MD;  Location: [facility] ENDOSCOPY;  Service: Gastroenterology;;   ESOPHAGOGASTRODUODENOSCOPY (EGD) WITH PROPOFOL N/A 04/24/2020   Procedure: ESOPHAGOGASTRODUODENOSCOPY (EGD) WITH PROPOFOL;  Surgeon: Doran Stabler, MD;  Location: [place];  Service: Gastroenterology;  Laterality: N/A;   IR US GUIDE BX ASP/DRAIN  04/30/2020   KIDNEY TRANSPLANT  2001   KIDNEY TRANSPLANT  2014   LEEP     TOTAL ABDOMINAL HYSTERECTOMY     ovaries left in situ   Past Surgical History:  Procedure Laterality Date   BIOPSY  04/24/2020   Procedure: BIOPSY;  Surgeon: Doran Stabler, MD;  Location: [place];  Service: Gastroenterology;;   ESOPHAGOGASTRODUODENOSCOPY (EGD) WITH PROPOFOL N/A 04/24/2020   Procedure: ESOPHAGOGASTRODUODENOSCOPY (EGD) WITH PROPOFOL;  Surgeon: Doran Stabler, MD;  Location: [place];  Service: Gastroenterology;  Laterality: N/A;   IR US GUIDE BX ASP/DRAIN  04/30/2020   KIDNEY TRANSPLANT  2001   KIDNEY TRANSPLANT  2014   LEEP     TOTAL ABDOMINAL HYSTERECTOMY     ovaries left in situ   Past Medical History:  Diagnosis Date   Complication of anesthesia    nausea and vomiting   DVT (deep venous thrombosis) (HCC)    History of osteomyelitis    Kidney transplant recipient    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(HCC)    BP (!) 161/96   Pulse 74   Ht 5\' 5"  (1.651 m)   Wt 149 lb 6.4 oz (67.8 kg)   SpO2 98%   BMI 24.86 kg/m   Opioid Risk Score:   Fall Risk Score:  `1  Depression screen PHQ 2/9  Depression screen [facility] 2/9 02/21/2021 01/27/2021 11/23/2020 11/22/2020  09/15/2020 06/08/2020  Decreased Interest 0 0 0 0 0 0  Down, Depressed, Hopeless 0 0 0 0 0 0  PHQ - 2 Score 0 0 0 0 0 0  Altered sleeping - - - - 1 -  Tired, decreased energy - - - - 0 -  Change in appetite - - - - 0 -  Feeling bad or failure about yourself  - - - - 0 -  Trouble concentrating - - - - 0 -  Moving slowly or fidgety/restless - - - - 0 -  Suicidal thoughts - - - - 0 -  PHQ-9 Score - - - - 1 -  Difficult doing work/chores - - - - Not difficult at all -     Review of Systems  Musculoskeletal:        Pelvic pain      Objective:   Physical Exam  Awake,alert, appropriate, brighter, colored hair- good color, NAD No assistive device! MS: RLE_ HF 4+/5; KE/KF 5-/5; DF 4+/5 and PF 5-/5 LLE- very slightly weaker, but not down a 1/2 level.  Can stand on heels and toes- no issues  Neuro: Intact to light touch except tip of toes B/L- much better HR 118 BP 161/96       Assessment & Plan:   Pt is a 55 yr old female with vulvar cancer and osteomyelitis- s/p admission for debility-  Here for f/u on LE weakness and debility-  Has severe debility- slowing getting to point to walk again with quad cane- Now off all assistive devices   Can recertify therapy for 6 months after this visit-  2. However does need to see PCP about elevated HR and BP- suggest restarting a mild b blocker. Used to be on Lisinopril.   3. Doesn't need PM&R anymore right now.    4. F/U as needed

## 2021-02-21 NOTE — Patient Instructions (Signed)
Pt is a 55 yr old female with vulvar cancer and osteomyelitis- s/p admission for debility-  Here for f/u on LE weakness and debility-  Has severe debility- slowing getting to point to walk again with quad cane- Now off all assistive devices   Can recertify therapy for 6 months after this visit-  2. However does need to see PCP about elevated HR and BP- suggest restarting a mild b blocker. Used to be on Lisinopril.   3. Doesn't need PM&R anymore right now.    4. F/U as needed

## 2021-02-23 ENCOUNTER — Ambulatory Visit: Admitting: Physical Therapy

## 2021-03-02 ENCOUNTER — Encounter: Payer: Self-pay | Admitting: Physical Therapy

## 2021-03-02 ENCOUNTER — Other Ambulatory Visit: Payer: Self-pay

## 2021-03-02 ENCOUNTER — Ambulatory Visit: Admitting: Physical Therapy

## 2021-03-02 DIAGNOSIS — M6281 Muscle weakness (generalized): Secondary | ICD-10-CM | POA: Diagnosis not present

## 2021-03-02 DIAGNOSIS — R2689 Other abnormalities of gait and mobility: Secondary | ICD-10-CM

## 2021-03-02 NOTE — Therapy (Addendum)
[place], [place], [address] Phone: [phone]   Fax:  [phone]  Physical Therapy Treatment / Discharge  Patient Details  Name: Christina Mack MRN: [id number] Date of Birth: 1965-09-30 Referring Provider (PT): Courtney Heys, MD   Encounter Date: 03/02/2021   PT End of Session - 03/02/21 1634     Visit Number 13    Number of Visits 18    Date for PT Re-Evaluation 03/21/21    Authorization Type TRICARE    PT Start Time 1625    PT Stop Time 1700    PT Time Calculation (min) 35 min    Activity Tolerance Patient tolerated treatment well    Behavior During Therapy [facility] for tasks assessed/performed             Past Medical History:  Diagnosis Date   Complication of anesthesia    nausea and vomiting   DVT (deep venous thrombosis) ([place])    History of osteomyelitis    Kidney transplant recipient    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[facility])     Past Surgical History:  Procedure Laterality Date   BIOPSY  04/24/2020   Procedure: BIOPSY;  Surgeon: Doran Stabler, MD;  Location: [place];  Service: Gastroenterology;;   ESOPHAGOGASTRODUODENOSCOPY (EGD) WITH PROPOFOL N/A 04/24/2020   Procedure: ESOPHAGOGASTRODUODENOSCOPY (EGD) WITH PROPOFOL;  Surgeon: Doran Stabler, MD;  Location: [place];  Service: Gastroenterology;  Laterality: N/A;   IR US GUIDE BX ASP/DRAIN  04/30/2020   KIDNEY TRANSPLANT  2001   KIDNEY TRANSPLANT  2014   LEEP     TOTAL ABDOMINAL HYSTERECTOMY     ovaries left in situ    There were no vitals filed for this visit.   Subjective Assessment - 03/02/21 1631     Subjective Patients reports she is doing well, she is feeling good. She saw the doctor and was discharged from her care, told to only follow-up if needed.    Patient Stated Goals Walk without cane, squat to get stuff off floor or out of low cabinet, improve getting in/out of car    Currently in Pain? No/denies                      [facility] Adult PT Treatment/Exercise:   Therapeutic Exercise: NuStep L6 x 5 min with LE only while taking subjective SLR 2 x 10 each Bridge 2 x 10 Sit to stand 2 x 10 Sidelying hip abduction 2 x 10 each Leg press (omega) 45# 2 x 15 Knee extension machine 20# 2 x 15   Manual Therapy: - n/a   Neuromuscular re-ed: SLS 2 x 30 sec each   Therapeutic Activity: - n/a   Modalities: - n/a   Self Care: - n/a                   PT Education - 03/02/21 1633     Education Details HEP    Person(s) Educated Patient    Methods Explanation;Demonstration;Verbal cues    Comprehension Verbalized understanding;Returned demonstration;Verbal cues required;Need further instruction              PT Short Term Goals - 11/25/20 1017       PT SHORT TERM GOAL #1   Title Patient will be I with initial HEP to progress with PT    Time 3    Period Weeks    Status Achieved    Target Date 11/08/20  PT SHORT TERM GOAL #2   Title PT will review FOTO with patient by 3rd visit to understand expected progress    Baseline reviewed 3rd visit    Time 3    Period Weeks    Status Achieved    Target Date 11/08/20               PT Long Term Goals - 03/02/21 1645       PT LONG TERM GOAL #1   Title Patient will be I with final HEP to maintain progress from PT    Baseline progressing with HEP    Time 8    Period Weeks    Status On-going    Target Date 03/21/21      PT LONG TERM GOAL #2   Title Patient will report >/= 62% functional status on FOTO to indicate improved functional ability    Baseline 52% functional status    Time 8    Period Weeks    Status On-going    Target Date 03/21/21      PT LONG TERM GOAL #3   Title Patient will demonsrate >/= 4-/5 MMT hip strength and >/= 4+/5 MMT knee strength to improve stair negotiation    Baseline continues to demonstrate gross weakness of hips    Time 8    Period Weeks    Status On-going    Target  Date 03/21/21      PT LONG TERM GOAL #4   Title Patient will demonstrate improved 5xSTS to </= 12 sec to reduce fall risk    Baseline 12 seconds    Time 6    Period Weeks    Status Achieved      PT LONG TERM GOAL #5   Title Patient will demonstrate 6MWT >/= 1000 ft without AD to improve community access    Baseline 1295 ft    Time 8    Period Weeks    Status Achieved                   Plan - 03/02/21 1639     Clinical Impression Statement Patient tolerated therapy well with no adverse effects. Therapy continues to focus on on progress LE strength and balance with good tolerance. She seems to be progressing well with therapy but continues to demonstrate gross weakness of hips and LEs that is gradually improving. No changes made to HEP this visit. Patient would benefit from continued skilled PT to progress her mobility and strength in order to improve walking ability and maximize her functional mobility.    PT Treatment/Interventions ADLs/Self Care Home Management;Aquatic Therapy;Neuromuscular re-education;Balance training;Therapeutic exercise;Therapeutic activities;Functional mobility training;Stair training;Gait training;Patient/family education;Manual techniques;Passive range of motion;Energy conservation    PT Next Visit Plan Review HEP and progress PRN, progress generalized LE strength, step-ups/squats, balance training    PT Home Exercise Plan [id number]    Consulted and Agree with Plan of Care Patient             Patient will benefit from skilled therapeutic intervention in order to improve the following deficits and impairments:  Abnormal gait, Difficulty walking, Decreased strength, Decreased activity tolerance, Decreased endurance, Decreased balance, Impaired flexibility  Visit Diagnosis: Muscle weakness (generalized)  Other abnormalities of gait and mobility     Problem List Patient Active Problem List   Diagnosis Date Noted   History of osteomyelitis  11/12/2020   Leg weakness, bilateral 09/15/2020   Screening for malignant neoplasm of skin 05/31/2020   Exposure  to confirmed case of COVID-19 05/18/2020   Immunocompromised state due to drug therapy ([place]) 05/18/2020   Protein-calorie malnutrition, severe 05/14/2020   Chronic kidney disease (CKD), stage IV (severe) (HCC)    Chronic pain syndrome    Cancer associated pain    Pressure injury of skin 05/06/2020   Debility    Acute on chronic anemia    Thrombocytopenia ([place]) 04/26/2020   Normocytic anemia 04/26/2020   CKD (chronic kidney disease) stage 4, GFR 15-29 ml/min (HCC) 04/26/2020   Wound of right groin 04/26/2020   Gastric ulcer without hemorrhage or perforation 04/26/2020   Malnutrition of moderate degree 04/26/2020   Pressure ulcer of coccygeal region, stage 2 ([place]) 04/21/2020   Weakness 04/20/2020   Acute deep vein thrombosis (DVT) of femoral vein ([place]) 12/05/2019   Iron deficiency anemia 11/10/2019   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([place]) 11/06/2019   Sprain of anterior talofibular ligament of right ankle 11/22/2016   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 [place], [place], [address] Phone: [phone]   Fax:  [phone]  Name: Christina Mack MRN: [id number] Date of Birth: 29-Mar-1966   PHYSICAL THERAPY DISCHARGE SUMMARY  Visits from Start of Care: 3  Current functional level related to goals / functional outcomes: See above   Remaining  deficits: See above   Education / Equipment: HEP   Patient agrees to discharge. Patient goals were partially met. Patient is being discharged due to the patient's request.

## 2021-03-11 ENCOUNTER — Ambulatory Visit
Admission: RE | Admit: 2021-03-11 | Discharge: 2021-03-11 | Disposition: A | Discharge status: Home / Self Care | Source: Ambulatory Visit

## 2021-03-11 ENCOUNTER — Other Ambulatory Visit: Payer: Self-pay

## 2021-03-11 VITALS — BP 143/90 | HR 80 | Temp 98.5°F | Resp 18

## 2021-03-11 DIAGNOSIS — J069 Acute upper respiratory infection, unspecified: Secondary | ICD-10-CM | POA: Diagnosis not present

## 2021-03-11 NOTE — ED Triage Notes (Signed)
Two day h/o fever, HA and runny nose. Denies cough, congestion, v/d. Has been taking  tylenol w/relief. Tmax 100.3.

## 2021-03-11 NOTE — ED Provider Notes (Signed)
[facility]    CSN: [id number] Arrival date & time: 03/11/21  8657      History   Chief Complaint Chief Complaint  Patient presents with   Fever   Nasal Congestion    HPI Christina Mack is a 55 y.o. female.   Patient here today for evaluation of 2-day history of headache, fever and runny nose.  She denies any significant cough or congestion.  She has tried over-the-counter medication without significant relief.  The history is provided by the patient.   Past Medical History:  Diagnosis Date   Complication of anesthesia    nausea and vomiting   DVT (deep venous thrombosis) (HCC)    History of osteomyelitis    Kidney transplant recipient    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[facility])     Patient Active Problem List   Diagnosis Date Noted   History of osteomyelitis 11/12/2020   Leg weakness, bilateral 09/15/2020   Screening for malignant neoplasm of skin 05/31/2020   Exposure to confirmed case of COVID-19 05/18/2020   Immunocompromised state due to drug therapy ([place]) 05/18/2020   Protein-calorie malnutrition, severe 05/14/2020   Chronic kidney disease (CKD), stage IV (severe) (HCC)    Chronic pain syndrome    Cancer associated pain    Pressure injury of skin 05/06/2020   Debility    Acute on chronic anemia    Thrombocytopenia ([place]) 04/26/2020   Normocytic anemia 04/26/2020   CKD (chronic kidney disease) stage 4, GFR 15-29 ml/min (HCC) 04/26/2020   Wound of right groin 04/26/2020   Gastric ulcer without hemorrhage or perforation 04/26/2020   Malnutrition of moderate degree 04/26/2020   Pressure ulcer of coccygeal region, stage 2 ([place]) 04/21/2020   Weakness 04/20/2020   Acute deep vein thrombosis (DVT) of femoral vein ([place]) 12/05/2019   Iron deficiency anemia 11/10/2019   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([place]) 11/06/2019   Sprain of anterior talofibular ligament of right ankle 11/22/2016   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   Aftercare following organ transplant 02/24/2013   Immunosuppression  ([place]) 02/24/2013   Nausea 02/24/2013   Postoperative abdominal pain 02/24/2013   Ureteral stent displacement ([place]) 02/24/2013   Kidney transplant status 11/22/2012   HTN (hypertension) 11/22/2012   Hyperparathyroidism, 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    Past Surgical History:  Procedure Laterality Date   BIOPSY  04/24/2020   Procedure: BIOPSY;  Surgeon: Doran Stabler, MD;  Location: [place];  Service: Gastroenterology;;   ESOPHAGOGASTRODUODENOSCOPY (EGD) WITH PROPOFOL N/A 04/24/2020   Procedure: ESOPHAGOGASTRODUODENOSCOPY (EGD) WITH PROPOFOL;  Surgeon: Doran Stabler, MD;  Location: [place];  Service: Gastroenterology;  Laterality: N/A;   IR US GUIDE BX ASP/DRAIN  04/30/2020   KIDNEY TRANSPLANT  2001   KIDNEY TRANSPLANT  2014   LEEP     TOTAL ABDOMINAL HYSTERECTOMY     ovaries left in situ    OB History     Gravida  1   Para  1   Term      Preterm      AB      Living         SAB      IAB      Ectopic      Multiple      Live Births               Home Medications    Prior to Admission medications   Medication Sig Start Date End  Date Taking? Authorizing Provider  metoprolol succinate (TOPROL-XL) 25 MG 24 hr tablet Take 12.5 mg by mouth in the morning and at bedtime.   Yes [provider]  acetaminophen (TYLENOL) 500 MG tablet Take 2 tablets (1,000 mg total) by mouth 3 (three) times daily. 05/05/20   Gaylan Gerold, DO  apixaban (ELIQUIS) 2.5 MG TABS tablet TAKE 1 TABLET (2.5 MG TOTAL) BY MOUTH DAILY. 05/17/20 05/17/21  Bary Leriche, PA-C  Cholecalciferol 125 MCG (5000 UT) capsule Take by mouth. Patient not taking: Reported on 02/09/2021 09/30/20   [provider]  cyclobenzaprine (FLEXERIL) 5 MG tablet Take 1-2 tablets (5-10 mg total) by mouth 2 (two) times daily as needed for muscle spasms. Patient not taking: Reported on 02/09/2021 12/17/20   Wieters, Hallie C,  PA-C  fluticasone (FLONASE) 50 MCG/ACT nasal spray Place 1 spray into both nostrils daily as needed. 05/16/17   [provider]  pantoprazole (PROTONIX) 40 MG tablet TAKE 1 TABLET (40 MG TOTAL) BY MOUTH 2 (TWO) TIMES DAILY BEFORE A MEAL. 05/17/20 05/17/21  Love, Ivan Anchors, PA-C  predniSONE (DELTASONE) 5 MG tablet Take 5 mg by mouth daily with breakfast.    [provider]  PROGRAF 1 MG capsule Take 1 mg by mouth 2 (two) times daily. 2mg  in morning 1mg  at night 01/31/20   [provider]    Family History Family History  Problem Relation Age of Onset   Cerebral aneurysm Mother    AAA (abdominal aortic aneurysm) Brother    Breast cancer Paternal Aunt    Heart attack Maternal Grandmother    Brain cancer Maternal Grandfather    Colon cancer Neg Hx    Ovarian cancer Neg Hx    Endometrial cancer Neg Hx    Pancreatic cancer Neg Hx    Prostate cancer Neg Hx     Social History Social History   Tobacco Use   Smoking status: Never   Smokeless tobacco: Never  Vaping Use   Vaping Use: Never used  Substance Use Topics   Alcohol use: Not Currently   Drug use: Never     Allergies   Oxycodone-acetaminophen, Zosyn [piperacillin sod-tazobactam so], Hydromorphone, Amlodipine, and Codeine   Review of Systems Review of Systems  Constitutional:  Positive for fever.  HENT:  Positive for rhinorrhea and sore throat. Negative for congestion and ear pain.   Eyes:  Negative for discharge and redness.  Respiratory:  Negative for cough, shortness of breath and wheezing.   Gastrointestinal:  Negative for abdominal pain, diarrhea, nausea and vomiting.  Neurological:  Positive for headaches.    Physical Exam Triage Vital Signs ED Triage Vitals  Enc Vitals Group     BP      Pulse      Resp      Temp      Temp src      SpO2      Weight      Height      Head Circumference      Peak Flow      Pain Score      Pain Loc      Pain Edu?      Excl. in [place]?    No data  found.  Updated Vital Signs BP (!) 143/90 (BP Location: Left Arm)   Pulse 80   Temp 98.5 F (36.9 C) (Oral)   Resp 18   SpO2 97%     Physical Exam Vitals and nursing note reviewed.  Constitutional:  General: She is not in acute distress.    Appearance: Normal appearance. She is not ill-appearing.  HENT:     Head: Normocephalic and atraumatic.     Right Ear: Tympanic membrane normal.     Left Ear: Tympanic membrane normal.     Nose: Congestion present.     Mouth/Throat:     Mouth: Mucous membranes are moist.     Pharynx: No oropharyngeal exudate or posterior oropharyngeal erythema.  Eyes:     Conjunctiva/sclera: Conjunctivae normal.  Cardiovascular:     Rate and Rhythm: Normal rate and regular rhythm.     Heart sounds: Normal heart sounds. No murmur heard. Pulmonary:     Effort: Pulmonary effort is normal. No respiratory distress.     Breath sounds: Normal breath sounds. No wheezing, rhonchi or rales.  Skin:    General: Skin is warm and dry.  Neurological:     Mental Status: She is alert.  Psychiatric:        Mood and Affect: Mood normal.        Thought Content: Thought content normal.     UC Treatments / Results  Labs (all labs ordered are listed, but only abnormal results are displayed) Labs Reviewed  COVID-19, FLU A+B NAA    EKG   Radiology No results found.  Procedures Procedures (including critical care time)  Medications Ordered in UC Medications - No data to display  Initial Impression / Assessment and Plan / UC Course  I have reviewed the triage vital signs and the nursing notes.  Pertinent labs & imaging results that were available during my care of the patient were reviewed by me and considered in my medical decision making (see chart for details).    COVID and flu screening ordered.  Will await results for further recommendation.  Encouraged symptomatic treatment in the meantime.   Final Clinical Impressions(s) / UC Diagnoses    Final diagnoses:  Acute upper respiratory infection   Discharge Instructions   None    ED Prescriptions   None    PDMP not reviewed this encounter.   Francene Finders, PA-C 03/11/21 1050

## 2021-03-12 LAB — COVID-19, FLU A+B NAA
Influenza A, NAA: NOT DETECTED
Influenza B, NAA: NOT DETECTED
SARS-CoV-2, NAA: DETECTED — AB

## 2021-03-16 ENCOUNTER — Ambulatory Visit: Admitting: Physical Therapy

## 2021-03-23 ENCOUNTER — Ambulatory Visit: Admitting: Physical Therapy

## 2021-03-24 ENCOUNTER — Ambulatory Visit: Admitting: Infectious Diseases

## 2021-04-12 ENCOUNTER — Encounter: Payer: Self-pay | Admitting: Infectious Diseases

## 2021-04-12 ENCOUNTER — Other Ambulatory Visit: Payer: Self-pay

## 2021-04-12 ENCOUNTER — Ambulatory Visit (INDEPENDENT_AMBULATORY_CARE_PROVIDER_SITE_OTHER): Admitting: Infectious Diseases

## 2021-04-12 VITALS — BP 160/97 | HR 86 | Temp 98.3°F | Wt 151.0 lb

## 2021-04-12 DIAGNOSIS — M86151 Other acute osteomyelitis, right femur: Secondary | ICD-10-CM | POA: Diagnosis not present

## 2021-04-12 DIAGNOSIS — M25551 Pain in right hip: Secondary | ICD-10-CM | POA: Diagnosis not present

## 2021-04-12 DIAGNOSIS — S31109S Unspecified open wound of abdominal wall, unspecified quadrant without penetration into peritoneal cavity, sequela: Secondary | ICD-10-CM

## 2021-04-12 NOTE — Progress Notes (Signed)
Patient: Christina Mack  DOB: 01-Sep-1965 MRN: [id number] PCP: Norina Buzzard, MD     Subjective:  Christina Mack is a 56 y.o. 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 She is on chronic immunosuppression for kidney transplant.  Completed 8 weeks of tedezolid, and ultimately 12 weeks of metronidazole + levaquin in May 2022.     HPI since last OV.  LOV in October when we treated with 4 week course of Augmentin for ongoing drainage and septic arthritis of the right pubic ramus. Wound discharge improved and nearly resolved. Now the drainage is pretty low volume at present. She has noticed progressively more swelling in the lower legs R>L. The swelling typically resolves by the morning when she wakes up. No calf pain/swelling/tenderness. Recalls conversation from previous oncology team that she may have lymphedema one day from previously +lymph node. She also reports new leg pain over the right trochanter and pelvis. No falls since last May 2022. No problems with the left hip or leg. Denies fevers/chills.   During her treatment with prolonged course of augmentin in the fall 2022 she had a platelet count checked by outside healthcare provider and noted it to be normal then. She still has bruises from her chronic anticoagulation but did not have any petechial rashes.    Review of Systems  Constitutional:  Negative for chills, diaphoresis, fever and weight loss.  HENT:  Negative for tinnitus.   Eyes:  Negative for blurred vision and photophobia.  Respiratory:  Negative for cough and sputum production.   Cardiovascular:  Positive for leg swelling. Negative for chest pain and claudication.  Gastrointestinal:  Negative for diarrhea, nausea and vomiting.  Genitourinary:  Negative for dysuria.  Musculoskeletal:  Positive for joint pain (left hip / trochanter).  Skin:  Positive for rash (bruising to left arm).  Neurological:   Negative for headaches.     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 30 tablet 0   apixaban (ELIQUIS) 2.5 MG TABS tablet TAKE 1 TABLET (2.5 MG TOTAL) BY MOUTH DAILY. 60 tablet 0   Cholecalciferol 25 MCG (1000 UT) capsule Take by mouth.     fluticasone (FLONASE) 50 MCG/ACT nasal spray Place 1 spray into both nostrils daily as needed.     metoprolol succinate (TOPROL-XL) 25 MG 24 hr tablet Take 12.5 mg by mouth in the morning and at bedtime.     pantoprazole (PROTONIX) 40 MG tablet TAKE 1 TABLET (40 MG TOTAL) BY MOUTH 2 (TWO) TIMES DAILY BEFORE A MEAL. 60 tablet 0   predniSONE (DELTASONE) 5 MG tablet Take 5 mg by mouth daily with breakfast.     PROGRAF 1 MG capsule Take 1 mg by mouth 2 (two) times daily. 2mg  in morning 1mg  at night     sodium bicarbonate 650 MG tablet Take 650 mg by mouth 2 (two) times daily.     Cholecalciferol 125 MCG (5000 UT) capsule Take by mouth. (Patient not taking: Reported on 04/12/2021)     cyclobenzaprine (FLEXERIL) 5 MG tablet  Take 1-2 tablets (5-10 mg total) by mouth 2 (two) times daily as needed for muscle spasms. (Patient not taking: Reported on 02/09/2021) 24 tablet 0   No facility-administered medications prior to visit.     Allergies  Allergen Reactions   Oxycodone-Acetaminophen Itching, Nausea Only, Other (See Comments), Nausea And Vomiting and Rash    Other Reaction: HA Other Reaction: HA    Zosyn [Piperacillin Sod-Tazobactam So] Other (See Comments)    Thrombocytopenia   Hydromorphone Nausea And Vomiting, Rash and Other (See Comments)   Amlodipine Other (See Comments)    Ankle swelling   Codeine      vomiting    Social History   Tobacco Use   Smoking status: Never   Smokeless tobacco: Never  Vaping Use   Vaping Use: Never used  Substance Use Topics   Alcohol use: Not Currently   Drug use: Never     Objective:   Vitals:   04/12/21 1513  BP: (!) 160/97  Pulse: 86  Temp: 98.3 F (36.8 C)  TempSrc: Temporal  SpO2: 99%  Weight: 151 lb (68.5 kg)   Body mass index is 25.13 kg/m.  Physical Exam Vitals reviewed.  HENT:     Mouth/Throat:     Mouth: No oral lesions.     Dentition: No dental abscesses.  Cardiovascular:     Rate and Rhythm: Normal rate and regular rhythm.     Heart sounds: Normal heart sounds.  Pulmonary:     Effort: Pulmonary effort is normal.     Breath sounds: Normal breath sounds.  Abdominal:     General: There is no distension.     Palpations: Abdomen is soft.     Tenderness: There is no abdominal tenderness.  Musculoskeletal:        General: Swelling (bilateral pitting edema in lower extremity R>L. Right leg has edema demonstrated through midthigh. Left leg to a lesser degree up to knee. negative homan's sign b/l.) present. No tenderness. Normal range of motion.  Lymphadenopathy:     Cervical: No cervical adenopathy.  Skin:    General: Skin is warm and dry.     Findings: No rash.  Neurological:     Mental Status: She is alert and oriented to person, place, and time.     Motor: No weakness.  Psychiatric:        Judgment: Judgment normal.    Lab Results: Lab Results  Component Value Date   WBC 6.7 06/08/2020   HGB 7.8 (L) 06/08/2020   HCT 23.8 (L) 06/08/2020   MCV 90.2 06/08/2020   PLT 177 06/08/2020    Lab Results  Component Value Date   CREATININE 2.93 (H) 06/08/2020   BUN 28 (H) 06/08/2020   NA 133 (L) 06/08/2020   K 5.5 (H) 06/08/2020   CL 99 06/08/2020   CO2 26 06/08/2020    Lab Results  Component Value Date   ALT 12 05/06/2020   AST 17 05/06/2020   ALKPHOS 213 (H) 05/06/2020   BILITOT 0.8 05/06/2020      Assessment & Plan:   Problem List Items Addressed This Visit       Unprioritized   Wound of right groin    Still open wound and waiting on wound care.       Right hip pain - Primary    Christina Mack has developed swelling to bilateral LEs R>L, new hip and greater trochanter pain and tenderness worse with sitting. She has had previous fractures of pelvis r/t  fall in May 2022 but this pain is worsened acutely since LOV. In the setting of chronic open wound concerned over new / persistent infection to the pelvis. Will set up for MRI w/o contrast (CKD s/p kidney transplant) to re-evaluate. She is clinically stable at this time and not systemically ill - hopeful there will not be a delay in her test. ER precautions discussed that would warrant trip to the hospital to expedite her care. Will call with results and next steps. 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 May need to start with empiric treatment. Would favor resuming Levaquin + Metronidazole if she needs longer course given concern with petechial rash/thrombocytopenia from beta lactams in the past.       [name], MSN, NP-C [facility] for [place] Pager: [phone] Office: [phone]  04/12/21  3:59 PM

## 2021-04-12 NOTE — Assessment & Plan Note (Signed)
Still open wound and waiting on wound care.

## 2021-04-12 NOTE — Patient Instructions (Addendum)
Hopeful we can get you scheduled for a repeat MRI of the pelvis this week.  Would like to see how that looks before we decide on antibiotics, which ones and for how long   Thigh high compression stockings for your legs and I would encourage you to mention it to your oncology team also.  There are lymphedema clinics around also that may be helpful to have you see.  Elevate legs throughout the day if you can to help with the swelling.

## 2021-04-12 NOTE — Assessment & Plan Note (Signed)
Christina Mack has developed swelling to bilateral LEs R>L, new hip and greater trochanter pain and tenderness worse with sitting. She has had previous fractures of pelvis r/t fall in May 2022 but this pain is worsened acutely since LOV. In the setting of chronic open wound concerned over new / persistent infection to the pelvis. Will set up for MRI w/o contrast (CKD s/p kidney transplant) to re-evaluate. She is clinically stable at this time and not systemically ill - hopeful there will not be a delay in her test. ER precautions discussed that would warrant trip to the hospital to expedite her care. Will call with results and next steps. 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 May need to start with empiric treatment. Would favor resuming Levaquin + Metronidazole if she needs longer course given concern with petechial rash/thrombocytopenia from beta lactams in the past.

## 2021-04-13 ENCOUNTER — Other Ambulatory Visit: Payer: Self-pay

## 2021-04-13 MED ORDER — CEPHALEXIN 500 MG PO CAPS: 500.0000 mg | ORAL_CAPSULE | Freq: Two times a day (BID) | ORAL | 0 refills | Status: AC

## 2021-04-22 ENCOUNTER — Ambulatory Visit (HOSPITAL_COMMUNITY)
Admission: RE | Admit: 2021-04-22 | Discharge: 2021-04-22 | Disposition: A | Discharge status: Home / Self Care | Source: Ambulatory Visit | Attending: Infectious Diseases | Admitting: Infectious Diseases

## 2021-04-22 ENCOUNTER — Other Ambulatory Visit: Payer: Self-pay

## 2021-04-22 DIAGNOSIS — M86151 Other acute osteomyelitis, right femur: Secondary | ICD-10-CM | POA: Diagnosis present

## 2021-04-22 IMAGING — MR MR PELVIS W/O CM
5 series · 48 of 48 positions shown · non-contrast
Comparison: [DATE], [DATE]

CLINICAL DATA: Right hip pain. History of osteomyelitis.

EXAM:
MRI PELVIS WITHOUT CONTRAST
TECHNIQUE: Multiplanar multisequence MR imaging of the pelvis was performed. No
intravenous contrast was administered.

[Series 5: T1 · coronal · 4.5mm · 1.19mm/px · 14 of 68 slices shown (1 of 2)]
[im 1/68]
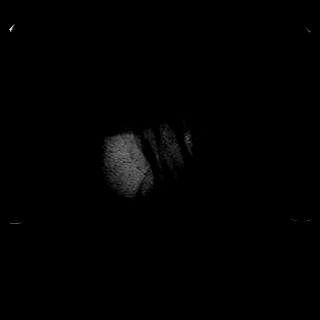
[im 6/68]
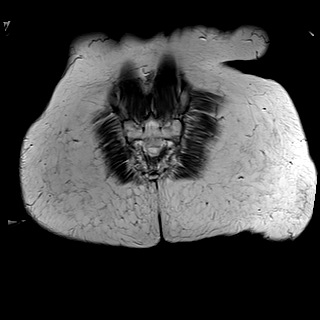
[im 11/68]
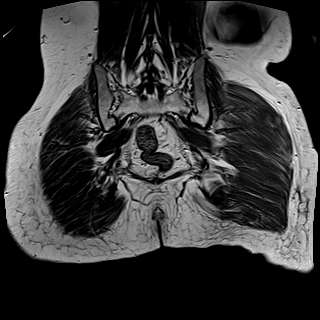
[im 16/68]
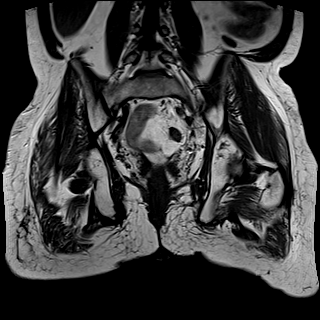
[im 21/68]
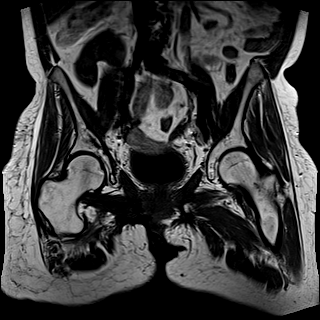
[im 26/68]
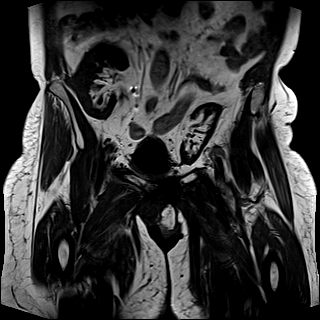
[im 31/68]
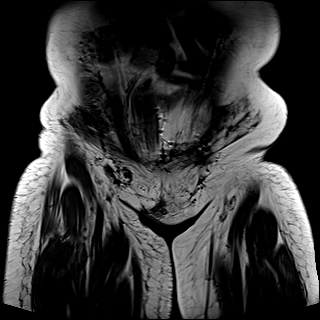
[im 37/68]
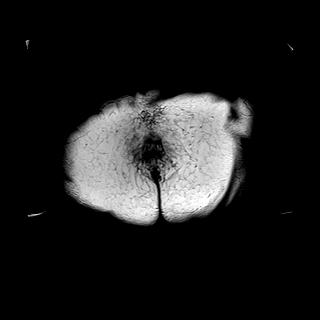
[im 42/68]
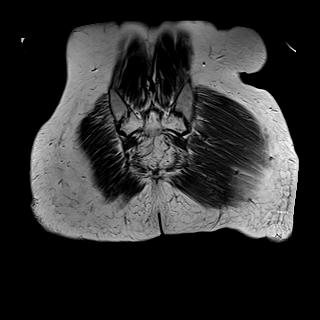
[im 47/68]
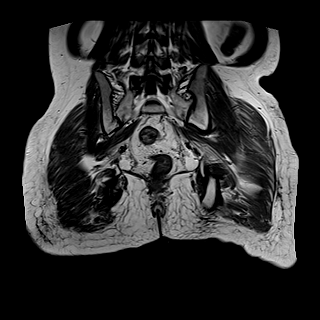
[im 52/68]
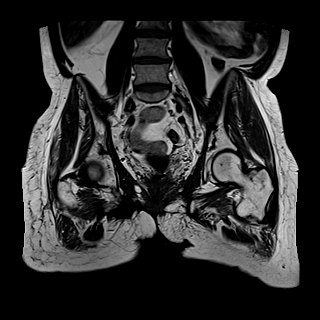
[im 57/68]
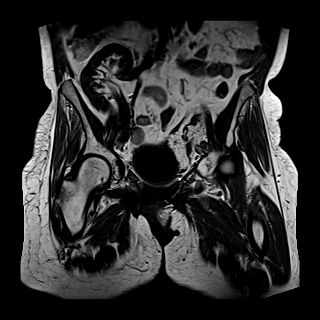
[im 62/68]
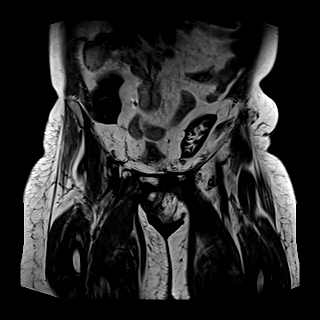
[im 68/68]
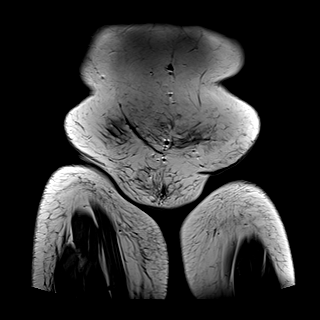

[Series 6: STIR · coronal · 4.5mm · 1.19mm/px · 7 of 34 slices shown]
[im 1/34]
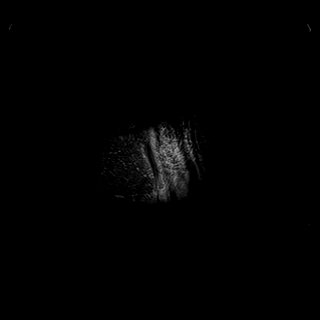
[im 6/34]
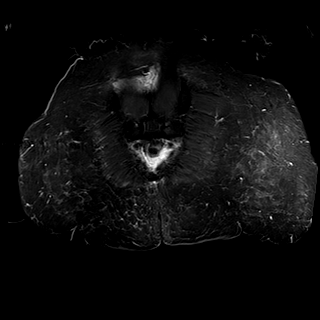
[im 12/34]
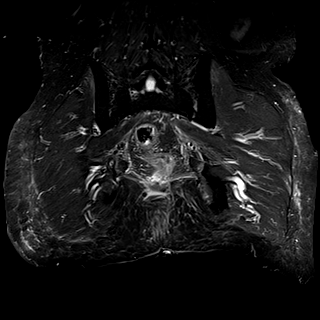
[im 17/34]
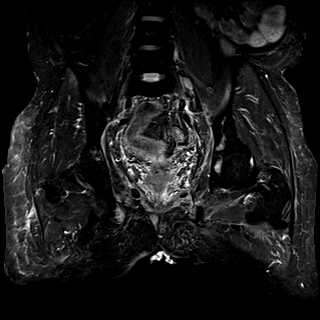
[im 23/34]
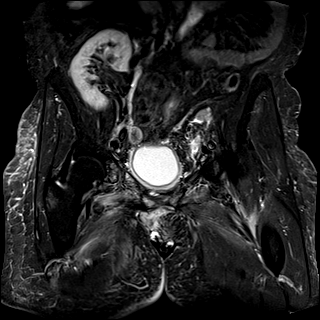
[im 28/34]
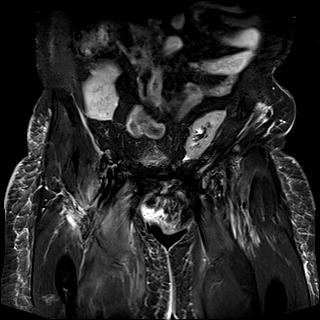
[im 34/34]
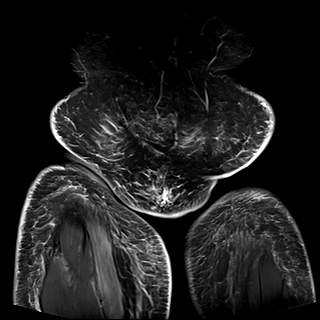

[Series 7: T1 · axial · 4.0mm · 1.12mm/px · z∈[-89,+138]mm · 9 of 43 slices shown (2 of 2)]
[im 1/43]
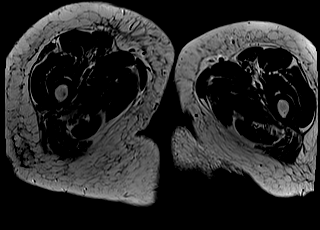
[im 6/43]
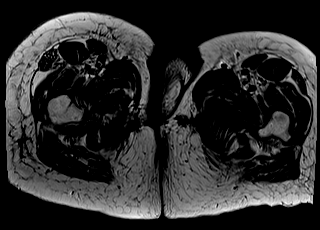
[im 11/43]
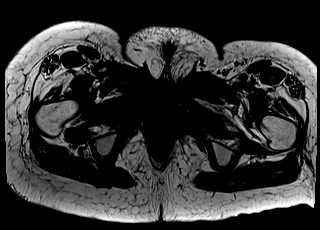
[im 16/43]
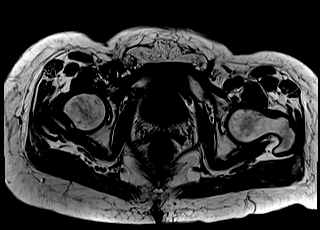
[im 22/43]
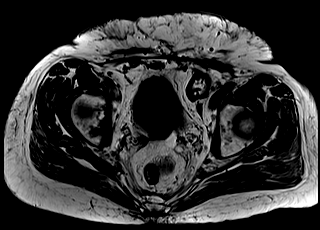
[im 27/43]
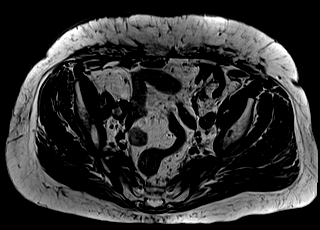
[im 32/43]
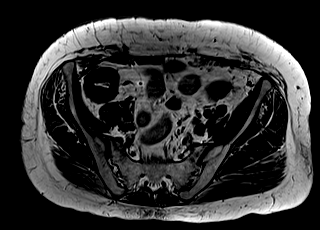
[im 37/43]
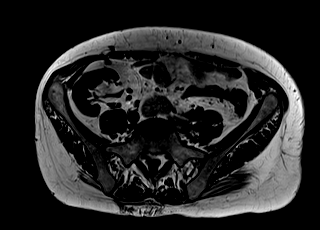
[im 43/43]
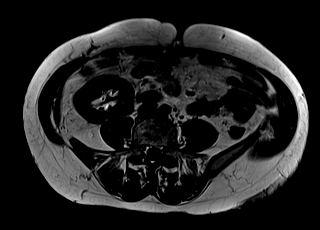

[Series 8: T2 fat-sat · axial · 4.0mm · 1.12mm/px · z∈[-89,+138]mm · 9 of 43 slices shown (1 of 2)]
[im 1/43]
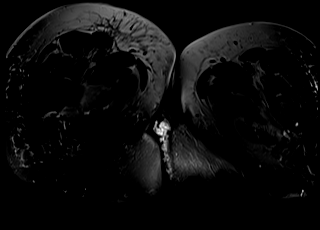
[im 6/43]
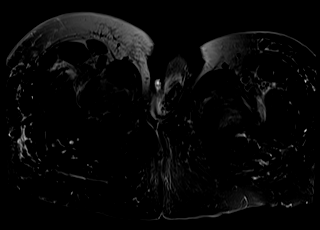
[im 11/43]
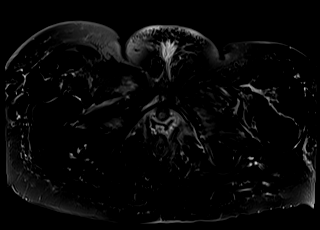
[im 16/43]
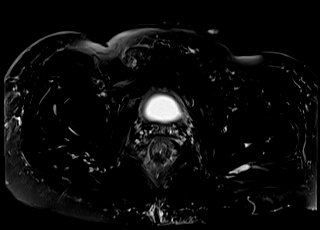
[im 22/43]
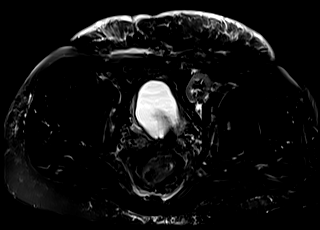
[im 27/43]
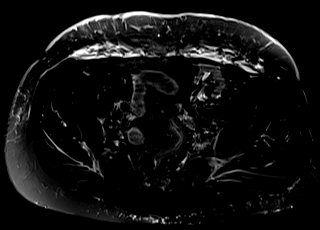
[im 32/43]
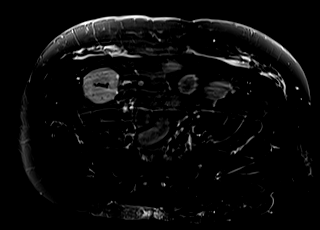
[im 37/43]
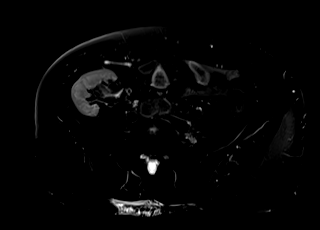
[im 43/43]
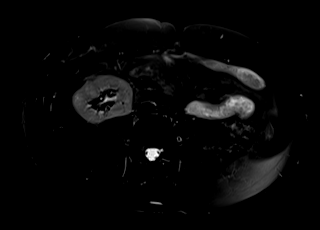

[Series 9: T2 fat-sat · sagittal · 5.0mm · 1.03mm/px · 9 of 46 slices shown (2 of 2)]
[im 1/46]
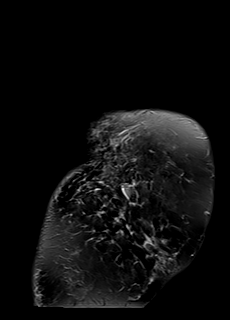
[im 6/46]
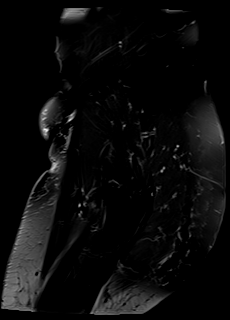
[im 12/46]
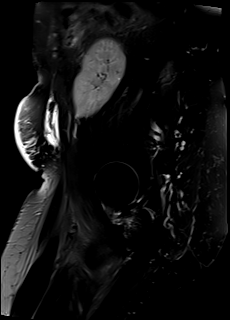
[im 17/46]
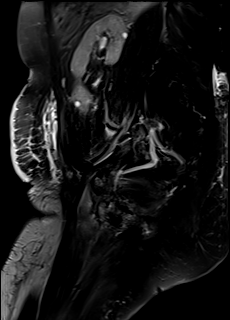
[im 23/46]
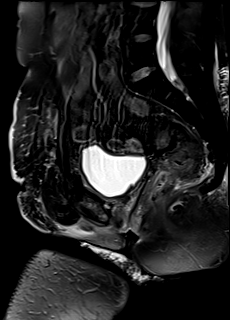
[im 29/46]
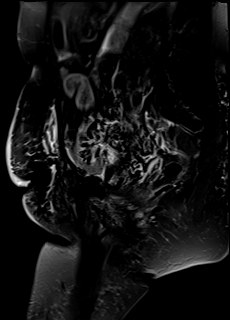
[im 34/46]
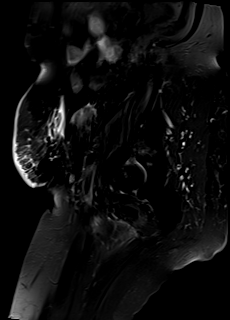
[im 40/46]
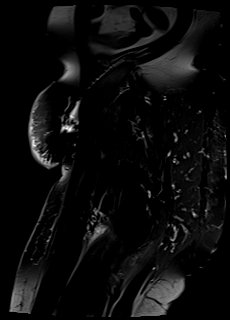
[im 46/46]
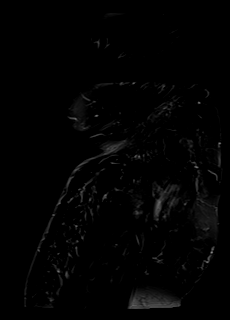

[48 of 48 positions shown; findings below may reference images not displayed]

FINDINGS: Bones:

Vertical linear serpiginous signal abnormality in the left superior
acetabulum consistent with a subacute insufficiency fracture without
significant surrounding marrow edema, displacement or depression.
Serpiginous linear signal abnormality in the right superior
acetabulum consistent with a nondisplaced insufficiency fracture
without surrounding marrow edema. Nondisplaced fracture of the right
superior pubic ramus and inferior pubic ramus.

Persistent large soft tissue wound in the right perineal region
extending to the cortex of the right inferior pubic ramus with
persistent bone marrow edema in the right inferior pubic ramus,
pubic body, and left pubic body most consistent with residual
osteomyelitis.

Healed bilateral sacral insufficiency fractures.

Otherwise no acute fracture or dislocation.

No periosteal reaction or bone destruction. No aggressive osseous
lesion.

Normal sacrum and sacroiliac joints. No SI joint widening or erosive
changes.

Articular cartilage and labrum

Articular cartilage:  No chondral defect.

Labrum: Grossly intact, but evaluation is limited by lack of
intraarticular fluid.

Joint or bursal effusion

Joint effusion:  No hip joint effusion.  No SI joint effusion.

Bursae: No bursa formation.

Muscles and tendons

Flexors: Normal.

Extensors: Normal.

Abductors: Normal.

Adductors: Mild edema in the adductor muscles bilaterally likely
reflecting persistent myositis, left greater than right. Interval
resolution of the complex fluid collections in the adductor
musculature.

Gluteals: Normal.

Hamstrings: Normal.

Other findings

No pelvic free fluid. No fluid collection or hematoma. No inguinal
lymphadenopathy. No inguinal hernia.
IMPRESSION: 1. Persistent large soft tissue wound in the right perineal region
extending to the cortex of the right inferior pubic ramus with
persistent bone marrow edema in the right inferior pubic ramus,
right pubic body, and left pubic body most consistent with residual
osteomyelitis. No drainable fluid collection.
2. Nondisplaced fracture of the right superior pubic ramus and
inferior pubic ramus.
3. Subacute insufficiency fracture of the superior acetabulum
bilaterally without significant surrounding marrow edema.
4. Healed bilateral sacral insufficiency fractures.

## 2021-04-26 ENCOUNTER — Other Ambulatory Visit: Payer: Self-pay | Admitting: Infectious Diseases

## 2021-04-26 DIAGNOSIS — M86151 Other acute osteomyelitis, right femur: Secondary | ICD-10-CM

## 2021-04-26 MED ORDER — METRONIDAZOLE 500 MG PO TABS: 500.0000 mg | ORAL_TABLET | Freq: Two times a day (BID) | ORAL | 1 refills | Status: DC

## 2021-04-26 MED ORDER — LEVOFLOXACIN 500 MG PO TABS: 500.0000 mg | ORAL_TABLET | ORAL | 0 refills | Status: DC

## 2021-04-26 NOTE — Progress Notes (Signed)
Called to discuss with [facility] MRI results - will resume levaquin + metronidazole for treatment again with radiographic concern over persistent pubic ramus osteomyelitis.   Hip pain has been intermittent, worsened with squatting. Also with noted non-displaced acetabular fracture.   Tolerated 500 mg levaquin last time we treated (prograf QTc interaction potential). Will resume this and metronidazole and have her for labs in 2 weeks. Last eGFR borderline at 21 mL/min.  Metronidazole 500 BID given chronic open pelvic wound.  FU arranged in 3 weeks with me in clinic. Will send for CRP at next lab draw at [facility].

## 2021-04-28 NOTE — Progress Notes (Signed)
Called to discuss results with Christina Mack. Appears she has residual osteomyelitis with bone marrow edema in the right inferior pubic ramus, pubic body and left pubic body.   With the bilateral non-displaced acetabular fractures will see if she can get in to see her regular ortho team for optimization of bone mineral density vs send to fracture liaison clinic at [place] team. Whichever she prefers.   Offered IV antibiotics to treat what appears concerning radiographically and clinically for ongoing osteomyelitis - however she would prefer to use oral treatment again. Nothing loculated and no abscess for targeting drainage. With her significant n/v to previous abx will resume levaquin/flagyl first then have her back in 3 weeks to see if we need to consider adding back tedizolid for previously recovered VRE from left hip SA.  Would not use betalactam for her longer term given TTP while on prolonged zosyn.   Appts have been arranged and she has no questions. Will do levaquin 500 mg Q48h for borderline eGFR 22 and Prograf interaction.

## 2021-05-17 ENCOUNTER — Other Ambulatory Visit: Payer: Self-pay

## 2021-05-17 ENCOUNTER — Encounter: Payer: Self-pay | Admitting: Infectious Diseases

## 2021-05-17 ENCOUNTER — Ambulatory Visit (INDEPENDENT_AMBULATORY_CARE_PROVIDER_SITE_OTHER): Admitting: Infectious Diseases

## 2021-05-17 VITALS — BP 145/94 | HR 76 | Temp 98.4°F | Wt 160.0 lb

## 2021-05-17 DIAGNOSIS — Z87310 Personal history of (healed) osteoporosis fracture: Secondary | ICD-10-CM | POA: Insufficient documentation

## 2021-05-17 DIAGNOSIS — N184 Chronic kidney disease, stage 4 (severe): Secondary | ICD-10-CM

## 2021-05-17 DIAGNOSIS — D696 Thrombocytopenia, unspecified: Secondary | ICD-10-CM | POA: Diagnosis not present

## 2021-05-17 DIAGNOSIS — Z792 Long term (current) use of antibiotics: Secondary | ICD-10-CM

## 2021-05-17 DIAGNOSIS — M86151 Other acute osteomyelitis, right femur: Secondary | ICD-10-CM | POA: Diagnosis not present

## 2021-05-17 NOTE — Assessment & Plan Note (Addendum)
In the setting of chronic open wound with purulent drainage. 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 No cultures to guide current infection as there are no drainable abscesses or septic arthritis noted on MRI.   She has been on levaquin 500 mg every other day PO for 3 weeks now and drainage is improved, not completely resolved. Clinically pain is mch better.   She had some pretty bad diarrhea with the metronidazole BID for anaerobic coverage, which I certainly want her on. Not much other options for her - I don't think PO levaquin + clindamycin is a good option and may make things way worse. She declined consideration of PICC line for IV dapto + ertapenam. She has had some intermittent, less severe diarrhea with resuming metronidazole once a day. No weight loss, good appetite, no cramping in abdomen - Will try to use OTC imodium to work through side effect and have her work back to metronidazole BID.   RTC in 4 weeks. She has orders for labs given with her today to take to [place] for CBC, BMP and CRP. Previously tracking her inflammatory markers were not overtly helpful and never trended down. May need to rely on follow up imaging to help make further decisions of duration of therapy.

## 2021-05-17 NOTE — Patient Instructions (Addendum)
°  Next Steps:   Try imodium for the diarrhea - this is over the counter and does not need to be adjusted for your kidneys.  Continue the Levaquin every other day like you have been Try to increase the metronidazole to twice a day - can take the imodium to see if this helps  Next time you get your kidney function drawn please have them let me know through your MyChart Would like to see you get in with an orthopedic doctor to optimize your osteoporosis and fracture risk. Emelda Brothers, NP is the provider that was recommended but they are part of [facility].  Call about the wound care clinic to see if you can get in soon.    Please come back in 4 - 5 weeks. I would like to see if you could do your kidney function, platelets and some other inflammation labs prior to the next visit back with me if you can.

## 2021-05-17 NOTE — Assessment & Plan Note (Signed)
Lab Results  Component Value Date   CREATININE 2.93 (H) 06/08/2020   Creatinine from 05/09/2021 3.05 - would like to repeat in 1 month to ensure no changes to doses of antibiotics

## 2021-05-17 NOTE — Progress Notes (Signed)
Patient: Christina Mack  DOB: Jan 02, 1966 MRN: [id number] PCP: Norina Buzzard, MD     Subjective:  Christina Mack is a 56 y.o. 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 She is on chronic immunosuppression for kidney transplant.  Completed 8 weeks of tedezolid, and ultimately 12 weeks of metronidazole + levaquin in May 2022.     HPI since last OV: Had horrible diarrhea > 5 < 10 diarrhea episodes a day with the metronidazole. Stopped that and continued levaquin every other day and has been doing well on that. She did recently try to reintroduce the metronidazole once a day with diarrhea to a lesser degree coming back a week ago. She does not have any associated weight loss, fevers/chills, appetite disruption. She did have some abdominal cramping at the beginning but none now.   Drainage from the wound is better. Hip pain is largely gone though still has limitations in being able to crouch down. Still no progress on the wound clinic but is not certain she would be able to do HBO rigorous schedule with work schedule.     Review of Systems  Constitutional:  Negative for chills, diaphoresis, fever and weight loss.  HENT:  Negative for tinnitus.   Eyes:  Negative for blurred vision and photophobia.  Respiratory:  Negative for cough and sputum production.   Cardiovascular:  Negative for chest pain, claudication and leg swelling.  Gastrointestinal:  Negative for diarrhea, nausea and vomiting.  Genitourinary:  Negative for dysuria.  Musculoskeletal:  Positive for joint pain (left hip / trochanter).  Skin:  Negative for rash.  Neurological:  Negative for headaches.     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 30 tablet 0   apixaban (ELIQUIS) 2.5 MG TABS tablet TAKE 1 TABLET (2.5 MG TOTAL) BY MOUTH DAILY. 60 tablet 0   Cholecalciferol 125 MCG (5000 UT) capsule Take by mouth.     Cholecalciferol 25 MCG (1000 UT) capsule Take by mouth.     cyclobenzaprine (FLEXERIL) 5 MG tablet Take 1-2 tablets (5-10 mg total) by mouth 2 (two) times daily as needed for muscle spasms. 24 tablet 0   fluticasone (FLONASE) 50 MCG/ACT nasal spray Place 1 spray into both nostrils daily as needed.     levofloxacin (LEVAQUIN) 500 MG tablet Take 1 tablet (500 mg total) by mouth every other day. Do not take with milk products 30 tablet 0   metoprolol succinate (TOPROL-XL) 25 MG 24 hr tablet Take 12.5 mg by mouth in the morning and at bedtime.     metroNIDAZOLE (FLAGYL) 500 MG tablet Take 1 tablet (500 mg total) by mouth 2 (two) times daily. 60 tablet 1   pantoprazole (PROTONIX) 40 MG tablet TAKE 1 TABLET (40 MG TOTAL) BY MOUTH 2 (TWO) TIMES DAILY BEFORE A MEAL. 60 tablet 0   predniSONE (DELTASONE) 5 MG tablet Take 5 mg by mouth daily with breakfast.     PROGRAF 1 MG capsule Take 1 mg by mouth 2 (two) times daily. 2mg  in morning  1mg  at night     sodium bicarbonate 650 MG tablet Take 650 mg by mouth 2 (two) times daily.     No facility-administered medications prior to visit.     Allergies  Allergen Reactions   Oxycodone-Acetaminophen Itching, Nausea Only, Other (See Comments), Nausea And Vomiting and Rash    Other Reaction: HA Other Reaction: HA    Zosyn [Piperacillin Sod-Tazobactam So] Other (See Comments)    Thrombocytopenia   Hydromorphone Nausea And Vomiting, Rash and Other (See Comments)   Amlodipine Other (See Comments)    Ankle swelling   Codeine     vomiting    Social History   Tobacco Use   Smoking status: Never   Smokeless tobacco: Never  Vaping Use   Vaping  Use: Never used  Substance Use Topics   Alcohol use: Not Currently   Drug use: Never     Objective:   Vitals:   05/17/21 1603  BP: (!) 145/94  Pulse: 76  Temp: 98.4 F (36.9 C)  TempSrc: Oral  Weight: 160 lb (72.6 kg)   Body mass index is 26.63 kg/m.  Physical Exam Constitutional:      Appearance: Normal appearance. She is not ill-appearing.  HENT:     Mouth/Throat:     Mouth: Mucous membranes are moist.     Pharynx: Oropharynx is clear.  Eyes:     General: No scleral icterus. Pulmonary:     Effort: Pulmonary effort is normal.  Neurological:     Mental Status: She is oriented to person, place, and time.  Psychiatric:        Mood and Affect: Mood normal.        Thought Content: Thought content normal.    Lab Results: Lab Results  Component Value Date   WBC 6.7 06/08/2020   HGB 7.8 (L) 06/08/2020   HCT 23.8 (L) 06/08/2020   MCV 90.2 06/08/2020   PLT 177 06/08/2020    Lab Results  Component Value Date   CREATININE 2.93 (H) 06/08/2020   BUN 28 (H) 06/08/2020   NA 133 (L) 06/08/2020   K 5.5 (H) 06/08/2020   CL 99 06/08/2020   CO2 26 06/08/2020    Lab Results  Component Value Date   ALT 12 05/06/2020   AST 17 05/06/2020   ALKPHOS 213 (H) 05/06/2020   BILITOT 0.8 05/06/2020     Assessment & Plan:   Problem List Items Addressed This Visit       Unprioritized   Chronic kidney disease (CKD), stage IV (severe) (HCC)   Thrombocytopenia ([place])    Platelets 146 recently drawn on 2/6 via [facility] after 3 weeks on current abx Will repeat in 1 month      CKD (chronic kidney disease) stage 4, GFR 15-29 ml/min (HCC)    Lab Results  Component Value Date   CREATININE 2.93 (H) 06/08/2020  Creatinine from 05/09/2021 3.05 - would like to repeat in 1 month to ensure no changes to doses of antibiotics      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 No cultures to guide current infection as there are no drainable abscesses or septic arthritis noted on MRI.   She has been on levaquin 500 mg every other day PO for 3 weeks now and drainage is improved, not completely resolved. Clinically pain is mch better.  She had some pretty bad diarrhea with the metronidazole BID for anaerobic coverage, which I certainly want her on. Not much other options for her - I don't think PO levaquin + clindamycin is a good option and may make things way worse. She declined consideration of PICC line for IV dapto + ertapenam. She has had some intermittent, less severe diarrhea with resuming metronidazole once a day. No weight loss, good appetite, no cramping in abdomen - Will try to use OTC imodium to work through side effect and have her work back to metronidazole BID.   RTC in 4 weeks. She has orders for labs given with her today to take to [place] for CBC, BMP and CRP. Previously tracking her inflammatory markers were not overtly helpful and never trended down. May need to rely on follow up imaging to help make further decisions of duration of therapy.       History of healed fragility fracture    Fall from standing height last May that has healed. B/L nondisplaced acetabular fractures noted on MRI recently.  She has several risk factors for osteoporosis - last dexa with osteopenia several years back, has had chronic open wound with bone infections and also immunosuppression medications that puts her at risk. D/W ortho colleague and she would be a good candidate for fragility fracture clinic - recommended [place], NP at [facility] but she is not certain they are in network. She will look into orthopedic consultation and see if her PCP can get her a referral to optimize bone density.       Other Visit Diagnoses     Long term (current) use of antibiotics         [name], MSN, NP-C [facility] for [place] Pager: [phone] Office: [phone]  05/17/21  5:10 PM

## 2021-05-17 NOTE — Assessment & Plan Note (Signed)
Platelets 146 recently drawn on 2/6 via [facility] after 3 weeks on current abx Will repeat in 1 month

## 2021-05-17 NOTE — Assessment & Plan Note (Signed)
Fall from standing height last May that has healed. B/L nondisplaced acetabular fractures noted on MRI recently.  She has several risk factors for osteoporosis - last dexa with osteopenia several years back, has had chronic open wound with bone infections and also immunosuppression medications that puts her at risk. D/W ortho colleague and she would be a good candidate for fragility fracture clinic - recommended [place], NP at [facility] but she is not certain they are in network. She will look into orthopedic consultation and see if her PCP can get her a referral to optimize bone density.

## 2021-05-30 ENCOUNTER — Ambulatory Visit: Admitting: Gynecologic Oncology

## 2021-06-14 ENCOUNTER — Ambulatory Visit (INDEPENDENT_AMBULATORY_CARE_PROVIDER_SITE_OTHER): Admitting: Infectious Diseases

## 2021-06-14 ENCOUNTER — Other Ambulatory Visit: Payer: Self-pay

## 2021-06-14 ENCOUNTER — Encounter: Payer: Self-pay | Admitting: Infectious Diseases

## 2021-06-14 DIAGNOSIS — M86151 Other acute osteomyelitis, right femur: Secondary | ICD-10-CM

## 2021-06-14 MED ORDER — METRONIDAZOLE 500 MG PO TABS: 500.0000 mg | ORAL_TABLET | Freq: Two times a day (BID) | ORAL | 0 refills | Status: DC

## 2021-06-14 MED ORDER — LEVOFLOXACIN 500 MG PO TABS: 500.0000 mg | ORAL_TABLET | ORAL | 0 refills | Status: DC

## 2021-06-14 MED ORDER — LEVOFLOXACIN 500 MG PO TABS: 500.0000 mg | ORAL_TABLET | ORAL | 0 refills | Status: AC

## 2021-06-14 NOTE — Patient Instructions (Addendum)
Will continue your antibiotics for another month to give you some time to get into wound care. I like the changes I hear with your wound.  ? ?Continue the levaquin every other day ?Continue the metronidazole twice a day  ? ?Will see you back in 2 months.  ?

## 2021-06-14 NOTE — Progress Notes (Signed)
? ?  ?Patient: Christina Mack  ?DOB: 1966-01-28 ?MRN: [id number] ?PCP: Norina Buzzard, MD  ? ? ? ?Subjective:  ?KENIA TEAGARDEN is a 56 y.o. 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 She is on chronic immunosuppression for kidney transplant.  ?Completed 8 weeks of tedezolid, and ultimately 12 weeks of metronidazole + levaquin in May 2022.  ? ? ? ?HPI since last OV: ?Pain is 100% resolved in the hip and pelvis. Drainage from the wound is gone. 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  ?Fortunately she is tolerating the flagyl now without diarrhea.  ?No other s/e to abx she has noticed.  ?Still with lymphedema related swelling of the Rt leg d/t previous radiation and lymph node dissection but better than before.  ? ? ?Review of Systems  ?Constitutional:  Negative for chills, diaphoresis, fever and weight loss.  ?HENT:  Negative for tinnitus.   ?Eyes:  Negative for blurred vision and photophobia.  ?Respiratory:  Negative for cough and sputum production.   ?Cardiovascular:  Negative for chest pain, claudication and leg swelling.  ?Gastrointestinal:  Negative for diarrhea, nausea and vomiting.  ?Genitourinary:  Negative for dysuria.  ?Musculoskeletal:  Negative for joint pain.  ?Skin:  Negative for rash.  ?Neurological:  Negative for headaches.  ? ? ? ?Past Medical History:  ?Diagnosis Date  ? Complication of anesthesia   ? nausea and vomiting  ? DVT (deep venous thrombosis) ([place])   ? History of osteomyelitis   ? Kidney transplant recipient   ? 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([place])   ? ? ?Outpatient Medications Prior to Visit  ?Medication Sig Dispense Refill  ? acetaminophen (TYLENOL) 500 MG tablet Take 2 tablets (1,000 mg total) by mouth 3 (three) times daily. 30 tablet 0  ? Cholecalciferol 125 MCG (5000 UT) capsule Take by mouth.    ? Cholecalciferol 25 MCG (1000 UT) capsule Take by mouth.    ?  cyclobenzaprine (FLEXERIL) 5 MG tablet Take 1-2 tablets (5-10 mg total) by mouth 2 (two) times daily as needed for muscle spasms. 24 tablet 0  ? fluticasone (FLONASE) 50 MCG/ACT nasal spray Place 1 spray into both nostrils daily as needed.    ? metoprolol succinate (TOPROL-XL) 25 MG 24 hr tablet Take 12.5 mg by mouth in the morning and at bedtime.    ? predniSONE (DELTASONE) 5 MG tablet Take 5 mg by mouth daily with breakfast.    ? PROGRAF 1 MG capsule Take 1 mg by mouth 2 (two) times daily. '2mg'$  in morning ?'1mg'$  at night    ? sodium bicarbonate 650 MG tablet Take 650 mg by mouth 2 (two) times daily.    ? apixaban (ELIQUIS) 2.5 MG TABS tablet TAKE 1 TABLET (2.5 MG TOTAL) BY MOUTH DAILY. 60 tablet 0  ? pantoprazole (PROTONIX) 40 MG tablet TAKE 1 TABLET (40 MG TOTAL) BY MOUTH 2 (TWO) TIMES DAILY BEFORE A MEAL. 60 tablet 0  ? levofloxacin (LEVAQUIN) 500 MG tablet Take 1 tablet (500 mg total) by mouth every other day. Do not take with milk products (Patient not taking: Reported on 06/14/2021) 30 tablet 0  ? metroNIDAZOLE (FLAGYL) 500 MG tablet Take 1 tablet (500 mg total) by mouth 2 (two) times daily. (Patient not taking: Reported on 06/14/2021) 60 tablet 1  ? ?No facility-administered medications prior to visit.  ?  ? ?Allergies  ?Allergen Reactions  ?  Oxycodone-Acetaminophen Itching, Nausea Only, Other (See Comments), Nausea And Vomiting and Rash  ?  Other Reaction: HA ?Other Reaction: HA ?  ? Zosyn [Piperacillin Sod-Tazobactam So] Other (See Comments)  ?  Thrombocytopenia  ? Hydromorphone Nausea And Vomiting, Rash and Other (See Comments)  ? Amlodipine Other (See Comments)  ?  Ankle swelling  ? Codeine   ?  vomiting  ? ? ?Social History  ? ?Tobacco Use  ? Smoking status: Never  ? Smokeless tobacco: Never  ?Vaping Use  ? Vaping Use: Never used  ?Substance Use Topics  ? Alcohol use: Not Currently  ? Drug use: Never  ? ? ? ?Objective:  ? ?Vitals:  ? 06/14/21 1554  ?BP: 136/90  ?Pulse: 90  ?Resp: 16  ?SpO2: 99%  ?Weight: 159  lb (72.1 kg)  ?Height: '5\' 5"'$  (1.651 m)  ? ?Body mass index is 26.46 kg/m?. ? ?Physical Exam ?Constitutional:   ?   Appearance: Normal appearance. She is not ill-appearing.  ?HENT:  ?   Mouth/Throat:  ?   Mouth: Mucous membranes are moist.  ?   Pharynx: Oropharynx is clear.  ?Eyes:  ?   General: No scleral icterus. ?Pulmonary:  ?   Effort: Pulmonary effort is normal.  ?Neurological:  ?   Mental Status: She is oriented to person, place, and time.  ?Psychiatric:     ?   Mood and Affect: Mood normal.     ?   Thought Content: Thought content normal.  ? ? ?Lab Results: ?Lab Results  ?Component Value Date  ? WBC 6.7 06/08/2020  ? HGB 7.8 (L) 06/08/2020  ? HCT 23.8 (L) 06/08/2020  ? MCV 90.2 06/08/2020  ? PLT 177 06/08/2020  ?  ?Lab Results  ?Component Value Date  ? CREATININE 2.93 (H) 06/08/2020  ? BUN 28 (H) 06/08/2020  ? NA 133 (L) 06/08/2020  ? K 5.5 (H) 06/08/2020  ? CL 99 06/08/2020  ? CO2 26 06/08/2020  ?  ?Lab Results  ?Component Value Date  ? ALT 12 05/06/2020  ? AST 17 05/06/2020  ? ALKPHOS 213 (H) 05/06/2020  ? BILITOT 0.8 05/06/2020  ?  ? ?Assessment & Plan:  ? ?Problem List Items Addressed This Visit   ? ?  ? Unprioritized  ? 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  Clinically improved with resolution of drainage now 6 weeks into treatment with levaquin and metronidazole. She is tolerating the metronidazole better now fortunately. She reports good improvement in the wound and more ingrowth of tissue and less palpable bone. Will continue another month for her given likely chronic nature of osteo and may warrant longer course. I would like for her to get into wound care also before we consider stopping.  ?Traditionally her inflammatory markers have not come down with treatment so not particularly useful for monitoring but will see what they look like at next lab visit at [place] next week.  ?Has another PET scan coming up - date TBD with gyn onc this Friday at visit. May be helpful for infection  follow up - if there still seems to be hypermetabolic activity there would be reasonable to continue treatment.  ?She is having some urine soiling with altered anatomy from the chronic wound - she would not at all be interested in urinary diversion surgery.  ? ?FU in 3m  ?  ?  ? Relevant Medications  ? levofloxacin (LEVAQUIN) 500 MG tablet  ? metroNIDAZOLE (FLAGYL) 500 MG tablet  ? ?  [name], MSN, NP-C ?[place] for Infectious Disease ?[facility] ?Pager: [phone] ?Office: [phone] ? ?06/14/21  ?4:33 PM ? ?

## 2021-06-14 NOTE — Assessment & Plan Note (Addendum)
Clinically improved with resolution of drainage now 6 weeks into treatment with levaquin and metronidazole. She is tolerating the metronidazole better now fortunately. She reports good improvement in the wound and more ingrowth of tissue and less palpable bone. Will continue another month for her given likely chronic nature of osteo and may warrant longer course. I would like for her to get into wound care also before we consider stopping.  ?Traditionally her inflammatory markers have not come down with treatment so not particularly useful for monitoring but will see what they look like at next lab visit at [place] next week.  ?Has another PET scan coming up - date TBD with gyn onc this Friday at visit. May be helpful for infection follow up - if there still seems to be hypermetabolic activity there would be reasonable to continue treatment.  ?She is having some urine soiling with altered anatomy from the chronic wound - she would not at all be interested in urinary diversion surgery.  ? ?FU in 44m  ?

## 2021-06-16 ENCOUNTER — Encounter: Payer: Self-pay | Admitting: Gynecologic Oncology

## 2021-06-17 ENCOUNTER — Encounter: Payer: Self-pay | Admitting: Gynecologic Oncology

## 2021-06-17 ENCOUNTER — Inpatient Hospital Stay: Attending: Gynecologic Oncology | Admitting: Gynecologic Oncology

## 2021-06-17 ENCOUNTER — Other Ambulatory Visit: Payer: Self-pay

## 2021-06-17 VITALS — BP 143/74 | HR 86 | Temp 98.2°F | Resp 16 | Ht 64.96 in | Wt 151.6 lb

## 2021-06-17 DIAGNOSIS — Z8544 Personal history of malignant neoplasm of other female genital organs: Secondary | ICD-10-CM | POA: Diagnosis not present

## 2021-06-17 DIAGNOSIS — Z923 Personal history of irradiation: Secondary | ICD-10-CM | POA: Insufficient documentation

## 2021-06-17 DIAGNOSIS — C519 Malignant neoplasm of vulva, unspecified: Secondary | ICD-10-CM

## 2021-06-17 DIAGNOSIS — Z86718 Personal history of other venous thrombosis and embolism: Secondary | ICD-10-CM | POA: Insufficient documentation

## 2021-06-17 DIAGNOSIS — I890 Lymphedema, not elsewhere classified: Secondary | ICD-10-CM | POA: Diagnosis not present

## 2021-06-17 DIAGNOSIS — Z7901 Long term (current) use of anticoagulants: Secondary | ICD-10-CM | POA: Diagnosis not present

## 2021-06-17 NOTE — Patient Instructions (Signed)
It was good to see you today.  I am glad you are feeling better.  I have placed an order for a PET scan.  I will let you know once I have the results.  I do still recommend that you reach out to the wound clinic for evaluation of additional wound care and the possibility of hyperbaric oxygen. ? ?I will see you in 3 months.  If you have any new and concerning symptoms develop before then, please call to see me sooner. ?

## 2021-06-17 NOTE — Progress Notes (Signed)
Gynecologic Oncology Return Clinic Visit ? ?06/17/2021 ? 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 Juleen China) ? ?Initial exam by Dr. Juleen China on 09/08/19: right vulva was completely involved by a large necrotic mass extending from the inguinal crease towards the anus measuring 12x5cm. It involved the distal vagina and extended to involve the left labia as well. It was difficult to evaluate her vaginal involvement due to pain. ?  ?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([place])  ?09/10/2019 Initial Biopsy  ? Vulvar biopsy: at least VIN3, biopsy demonstrates extreme ulceration, inflammation and granulation tissue. Underlying invasion very difficult to rule out. ?  ?09/16/2019 Imaging  ? PET: ?IMPRESSION:  ?1.  Large hypermetabolic mass in the right side of the perineum, consistent with known vulvar malignancy.  ?2.  Hypermetabolic right groin lymphadenopathy is consistent with nodal involvement.  ?3.  Tiny right apical pulmonary nodule is below PET resolution.  ?  ?09/16/2019 Initial Diagnosis  ? 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[facility]) ?  ?11/03/2019 - 01/15/2020 Radiation Therapy  ? IMRT. ?She received 45GY/25 fractions to the pelvic and inguinal nodes and vulvar mass. The vulvar mass and enlarged inguinal nodes received a boost dose of 1800cGY/10 fractions for total dose of 63Gy/35 fractions.  ?Treated in [place]. ?Diagnosed with DVT several weeks into treatment. ?Missed treatments due to feeling poorly. ?Received 4 Cisplatin (stopped due to elevated Cr) - 8/5, 8/11, 8/18, 8/25 ?  ?04/03/2020 Imaging  ? CT A/P w/o contrast: ?1.  Redemonstration of the large right perineal mass, with new extensive surrounding soft tissue  stranding and gas which extends along the bilateral anterior pubic bones and anterior pelvis.  I'm unable to exclude developing necrotizing fasciitis.  No definite drainable abscess collection is identified.  ?2.  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  ?3.  Diffuse edema of the pelvic mesentery with mild presacral fluid, concerning for mesenteritis.  No definite drainable intraperitoneal fluid collection identified.  ?4.  Suspected bowel ileus or gastroenteritis.  ?5.  Moderately distended gallbladder with heterogeneous dependent material suggestive of sludge and possible gallstones.  ?6.  Hepatosplenomegaly.  ?7.  Suspected anemia.  ?8.  Moderate diffuse urinary bladder wall thickening suggesting chronic cystitis.  ?  ?04/20/2020 - 05/05/2020 Hospital Admission  ? Admitted in the setting of right groin wound. Had been admitted to [facility] 1/2 with concerns for soft tissue infection. Started on vanc/zosyn --> oral levo/flagyl to complete 3 weeks of treatment ( EOT 1/22). Pelvic MRI at [facility] showed multilocular abscess of left pelvis, myositis, nondisplaced fracture of inferior right pubic ramus, concern for osteomytlitis. IR drainage of abscess was performed - VRE on culture. ID recommended IV dapto --> tedezolid oupatient (as well as levo + flagyl) for 6 weeks total.  ?  ?04/26/2020 Imaging  ? MRI pelvis w/o contrast: ?IMPRESSION: ?1. Large area of ulceration with sinus tract seen within the right ?labial fold with diffuse surrounding phlegmon and extensive myositis ?in the bilateral adductor and anterior muscular compartments of both ?thighs. ?2. There is a multilocular fluid collection extending in the ?inferior left pelvic soft tissues surrounding the pubic rami and ?extending to the anterior upper left thigh, consistent with a ?multilocular abscess. ?3. Nondisplaced fracture of  the inferior right pubic rami with ?findings suggestive osteomyelitis involving the pubic rami and ?anterior  pubic symphysis. ?4. Findings suggestive of osteomyelitis involving the anterior left ?pubic symphysis. ?  ?05/05/2020 - 05/17/2020 Hospital Admission  ? Admitted for inpatient rehab given debility. ?  ?07/27/2020 Imaging  ? PET: ?Significant decrease in size and avidity of vulvar lesion. Air and soft tissue inflammation. Mild PET hypermetabolic activity. Resolution of inguinal LN activity. 7m LU love groundglass nodule, mild hypermetabolic activity. ?  ?09/14/2020 Imaging  ? CT Chest w/o contrast: ?IMPRESSION:  ?1.  Resolution of left upper lobe nodule and associated groundglass opacity.  ?2.  Stable 5 mm nodular opacity right upper lobe.  ?3.  Stable 3 mm nodule right lower lobe.  ?  ? ? ?Interval History: ?Patient reports overall doing well.  She continues to follow with infectious disease.  She is continuing on antibiotics for another 2 weeks.  She denies any pelvic or groin pain.  She thinks drainage is overall decreased, has difficulty telling if it is urinary or from her wound.  She denies any associated odor or bleeding. ? ?She endorses baseline bladder and bowel function. ? ?Has noted some right intermittent leg edema that is dependent on activity.  Using compression sock with good relief. ? ?Past Medical/Surgical History: ?Past Medical History:  ?Diagnosis Date  ? Complication of anesthesia   ? nausea and vomiting  ? D[place]) [place]   ? History of osteomyelitis   ? Kidney transplant recipient   ? 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([place]   ? ? ?Past Surgical History:  ?Procedure Laterality Date  ? BIOPSY  04/24/2020  ? Procedure: BIOPSY;  Surgeon: DDoran Stabler MD;  [place]  Service: Gastroenterology;;  ? ESOPHAGOGASTRODUODENOSCOPY (EGD) WITH PROPOFOL N/A 04/24/2020  ? Procedure: ESOPHAGOGASTRODUODENOSCOPY (EGD) WITH PROPOFOL;  Surgeon: DDoran Stabler MD;  [place]  Service: Gastroenterology;  Laterality: N/A;  ? IR [place] BX ASP/DRAIN  04/30/2020  ? KIDNEY TRANSPLANT   2001  ? KIDNEY TRANSPLANT  2014  ? LEEP    ? TOTAL ABDOMINAL HYSTERECTOMY    ? ovaries left in situ  ? ? ?Family History  ?Problem Relation Age of Onset  ? Cerebral aneurysm Mother   ? AAA (abdominal aortic aneurysm) Brother   ? Breast cancer Paternal Aunt   ? Heart attack Maternal Grandmother   ? Brain cancer Maternal Grandfather   ? Colon cancer Neg Hx   ? Ovarian cancer Neg Hx   ? Endometrial cancer Neg Hx   ? Pancreatic cancer Neg Hx   ? Prostate cancer Neg Hx   ? ? ?Social History  ? ?Socioeconomic History  ? Marital status: Married  ?  Spouse name: Not on file  ? Number of children: Not on file  ? Years of education: Not on file  ? Highest education level: Not on file  ?Occupational History  ? Not on file  ?Tobacco Use  ? Smoking status: Never  ? Smokeless tobacco: Never  ?Vaping Use  ? Vaping Use: Never used  ?Substance and Sexual Activity  ? Alcohol use: Not Currently  ? Drug use: Never  ? Sexual activity: Not Currently  ?Other Topics Concern  ? Not on file  ?Social History Narrative  ? Not on file  ? ?Social Determinants of Health  ? ?Financial Resource Strain: Not on file  ?Food Insecurity: Not on file  ?Transportation Needs: Not on file  ?Physical Activity: Not on file  ?  Stress: Not on file  ?Social Connections: Not on file  ? ? ?Current Medications: ? ?Current Outpatient Medications:  ?  acetaminophen (TYLENOL) 500 MG tablet, Take 2 tablets (1,000 mg total) by mouth 3 (three) times daily., Disp: 30 tablet, Rfl: 0 ?  Cholecalciferol 25 MCG (1000 UT) capsule, Take by mouth., Disp: , Rfl:  ?  fluticasone (FLONASE) 50 MCG/ACT nasal spray, Place 1 spray into both nostrils daily as needed., Disp: , Rfl:  ?  levofloxacin (LEVAQUIN) 500 MG tablet, Take 1 tablet (500 mg total) by mouth every other day. Do not take with milk products, Disp: 15 tablet, Rfl: 0 ?  metoprolol succinate (TOPROL-XL) 25 MG 24 hr tablet, Take 12.5 mg by mouth in the morning and at bedtime., Disp: , Rfl:  ?  predniSONE (DELTASONE) 5 MG  tablet, Take 5 mg by mouth daily with breakfast., Disp: , Rfl:  ?  PROGRAF 1 MG capsule, Take 1 mg by mouth 2 (two) times daily. '2mg'$  in morning '1mg'$  at night, Disp: , Rfl:  ?  sodium bicarbonate 650 MG tablet,

## 2021-07-05 ENCOUNTER — Telehealth: Payer: Self-pay

## 2021-07-05 NOTE — Telephone Encounter (Signed)
Spoke with Ms. Christina Mack this afternoon and notified her that her insurance has not authorized the PET scan she has scheduled for Friday 07/08/21. Because of this her PET scan has been cancelled and can be rescheduled once insurance approval has been received. Instructed patient that our office is monitoring this and will reschedule it as soon as possible. Authorization can take up to 5 days.  ?Patient states "That's ok, I understand. When you reschedule it I would prefer something in the evening after 3:30 or 4pm." Advised patient that we will be in contact with the appointment details.  ?

## 2021-07-07 ENCOUNTER — Telehealth: Payer: Self-pay

## 2021-07-07 NOTE — Telephone Encounter (Signed)
Spoke with patient this afternoon and notified her that her PET scan has been approved and rescheduled for 4/19 at 3:30pm. She is to arrive by 3pm and be npo 6 hours prior to scan (0930 am) but can have water. Patient verbalized understanding.  ?

## 2021-07-08 ENCOUNTER — Other Ambulatory Visit (HOSPITAL_COMMUNITY)

## 2021-07-08 ENCOUNTER — Encounter (HOSPITAL_COMMUNITY)

## 2021-07-20 ENCOUNTER — Encounter (HOSPITAL_COMMUNITY)
Admission: RE | Admit: 2021-07-20 | Discharge: 2021-07-20 | Disposition: A | Discharge status: Home / Self Care | Source: Ambulatory Visit | Attending: Gynecologic Oncology | Admitting: Gynecologic Oncology

## 2021-07-20 DIAGNOSIS — C519 Malignant neoplasm of vulva, unspecified: Secondary | ICD-10-CM | POA: Insufficient documentation

## 2021-07-20 LAB — GLUCOSE, CAPILLARY: Glucose-Capillary: 117 mg/dL — ABNORMAL HIGH (ref 70–99)

## 2021-07-20 IMAGING — CT NM PET TUM IMG RESTAG (PS) SKULL BASE T - THIGH
1 of 7 series · 1 of 25 positions shown · non-contrast
Comparison: PET-CT [DATE]

CLINICAL DATA: Subsequent treatment strategy for vulvar carcinoma.

EXAM:
NUCLEAR MEDICINE PET SKULL BASE TO THIGH
TECHNIQUE: 7.64 mCi F-18 FDG was injected intravenously. Full-ring PET imaging
was performed from the skull base to thigh after the radiotracer. CT
data was obtained and used for attenuation correction and anatomic
localization.
Fasting blood glucose: 117 mg/dl

[Series 4: ct sk_thigh 5.0 br38 · axial · 5.0mm · 0.93mm/px · 1 of 227 slices shown]
[im 227/227  brain]
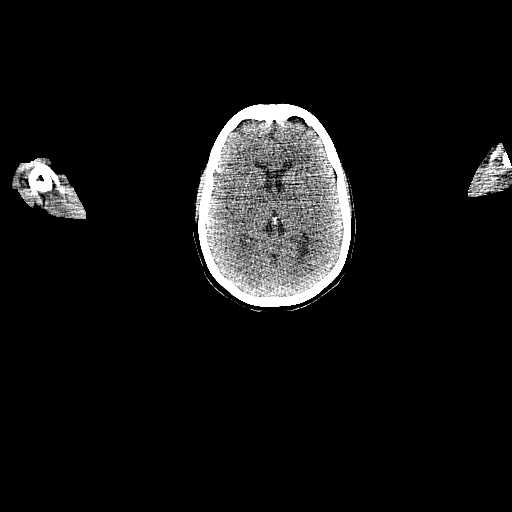

[1 of 25 positions shown; findings below may reference images not displayed]

FINDINGS: Mediastinal blood pool activity: SUV max

Liver activity: SUV max NA

NECK: No hypermetabolic lymph nodes in the neck.

Incidental CT findings: none

CHEST: No hypermetabolic mediastinal or hilar nodes. No suspicious
pulmonary nodules on the CT scan.

Incidental CT findings: Stable scattered age advanced coronary
artery calcifications.

ABDOMEN/PELVIS: No abnormal hypermetabolic activity within the
liver, pancreas, adrenal glands, or spleen. No hypermetabolic lymph
nodes in the abdomen or pelvis.

No enlarged or hypermetabolic pelvic or inguinal nodes.

Incidental CT findings: The native kidneys are markedly atrophied
and demonstrate extensive renal vascular calcifications. There is a
significantly atrophied left pelvic kidney and a normal appearing
transplant right pelvic kidney.

Stable scattered vascular calcifications.

SKELETON: Some healing changes are noted at the pubic symphysis and
involving both pubic bones. Less hypermetabolism and demonstrated on
the prior study. SUV max is 2.62 and was previously 5.95. There is a
new pathologic fracture involving the right inferior pubic ramus.
Progressive hypermetabolism is noted. SUV max is 6.82 nodes
previously 4.27. This is likely pathologic fracture related to
infection/osteomyelitis. There is also a suspected nondisplaced
fracture involving the right superior pubic ramus (CT image 180/4).
Persistent large open wound involving the right perineum extending
down to the bone.

Incidental CT findings: none
IMPRESSION: 1. No findings suspicious for recurrent vulvar carcinoma or
metastatic disease.
2. Persistent open wound along the right perineum extending down to
the bone. There are progressive changes of probable osteomyelitis
involving the right inferior pubic ramus with pathologic fractures.
3. Interval improved appearance of the pubic symphysis and adjacent
pubic bones.

## 2021-07-20 MED ORDER — FLUDEOXYGLUCOSE F - 18 (FDG) INJECTION
7.6400 | Freq: Once | INTRAVENOUS | Status: AC | PRN
  Administered 2021-07-20: 7.64 via INTRAVENOUS

## 2021-07-22 ENCOUNTER — Telehealth: Payer: Self-pay

## 2021-07-22 ENCOUNTER — Other Ambulatory Visit: Payer: Self-pay

## 2021-07-22 MED ORDER — LEVOFLOXACIN 500 MG PO TABS: 500.0000 mg | ORAL_TABLET | Freq: Every day | ORAL | 0 refills | Status: DC

## 2021-07-22 MED ORDER — METRONIDAZOLE 500 MG PO TABS: 500.0000 mg | ORAL_TABLET | Freq: Two times a day (BID) | ORAL | 0 refills | Status: DC

## 2021-07-22 NOTE — Telephone Encounter (Signed)
Patient aware. Patient stated that she is going to call [facility] wound care to see if they accept her insurance. Patient stated that she has to go through her PCP to be referred to specialists office due to insurance and she will ask PCP to refer her to orthopedic.  ? ? ?Shakaya Bhullar P Kathyleen Radice, CMA ? ?

## 2021-07-22 NOTE — Telephone Encounter (Signed)
-----   Message from [place] Callas, NP sent at 07/22/2021  3:20 PM EDT ----- ?Regarding: FW: ?Hi team ?Can we call Christina Mack to let her know that the PET Scan shows improvement but not completely resolved bone infection. She has a new pathological fracture also in the right pelvis.  ?I would like to refill her antibiotics for another 4 more weeks please  ?Levaquin and metronidazole are the two that need refills if you can send in to preferred pharmacy.  ? ?We can keep her current appointment the same.  ?I am happy to call her Monday to talk more, had to leave early today ? ?Can we also ask if she has had follow up blood work with her kidney team yet? Was waiting on those results to make sure her antibiotic dosing is still ok.  ? ?Thank you! ?----- Message ----- ?From: Lafonda Mosses, MD ?Sent: 07/22/2021   1:52 PM EDT ?To: Cleona Callas, NP ? ?Hi Stephanie ?I got a follow-up PET scan for this patient and she remains without evidence of cancer recurrence but it looks like they are favoring that she may have osteomyelitis.  Looks like some areas have improved compared to last imaging but not all.  I think you are scheduled to see her in May, but wanted to make sure that you saw this in case she would like to see you or any earlier. ?Thanks! ?Wendelyn Breslow  ?

## 2021-07-22 NOTE — Telephone Encounter (Signed)
Left voicemail asking patient to return my call.   Caron Tardif P [place], CMA  

## 2021-07-22 NOTE — Telephone Encounter (Signed)
Patient aware of PET scan results. Patient stated that she is aware of the pathological fracture in right pelvis due to a piece of bone falling out from her pelvis onto the bathroom floor two weeks ago. I questioned if she was ok or any pain - she stated she was okay. Antibiotics refilled and sent to pharmacy. Patient hasn't had blood work with kidney doctor yet - she stated she would go next week. Patient stated that she is unable to get into wound care with [facility] due to them not accepting her insurance([employer]). I recommended patient call [facility] to see if the wound center in [facility] accepts her insurance. Also would send a message back to see if [place] has any recommendations for wound doctors.  ? ? ?Abdulrahim Siddiqi P Susi Goslin, CMA ? ?

## 2021-08-09 ENCOUNTER — Ambulatory Visit (INDEPENDENT_AMBULATORY_CARE_PROVIDER_SITE_OTHER): Admitting: Infectious Diseases

## 2021-08-09 ENCOUNTER — Other Ambulatory Visit: Payer: Self-pay

## 2021-08-09 ENCOUNTER — Encounter: Payer: Self-pay | Admitting: Infectious Diseases

## 2021-08-09 DIAGNOSIS — M86151 Other acute osteomyelitis, right femur: Secondary | ICD-10-CM

## 2021-08-09 DIAGNOSIS — S31109S Unspecified open wound of abdominal wall, unspecified quadrant without penetration into peritoneal cavity, sequela: Secondary | ICD-10-CM

## 2021-08-09 NOTE — Patient Instructions (Signed)
See if your kidney team can add a CRP / ESR to your lab draw Friday and fax results to me [phone].  ? ?Stop your antibiotics for now. Give your gut a break.  ?Will re-convene after you see orthopedic team in 2 weeks.  ? ? ?

## 2021-08-09 NOTE — Progress Notes (Signed)
? ?  ?Patient: Christina Mack  ?DOB: [birth year] ?MRN: [id number] ?PCP: Norina Buzzard, MD  ? ? ? ?Subjective:  ?Christina Mack is a 56 y.o. 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 She is on chronic immunosuppression for kidney transplant.  ? ?Hospitalized late January 2022 with polymicrobial bacteremia at [facility] with bacteroides and eggerthella species.  Severe TTP reaction to zosyn and refused PICC line and given 12 weeks of levaquin and metronidazole with tedezolid added (8 weeks) (after left sided hip aspirate revealed VRE).  Completed 8 weeks of tedezolid, and ultimately 12 weeks of metronidazole + levaquin in May 2022.  ?Wound present since 2021 following radiation for vulvar cancer.  ? ? ? ?CC: ?Santasia is here for follow up. Has been on 2nd course of prolonged Levaquin + Metronidazole now for 12 weeks. She has had 2 bone fragments come out of her open vulvar wound over the last few weeks. She has been able to palpate bone during wound changes for 1 year now; Last OV she did notice some improvement in tissue ingrowth but has since stalled out. Has continued to have a tremendous time trying to get approval for wound and hyperbaric care.  ? ?No pain in the pelvis and able to ambulate without aid. No falls since LOV. No fevers/chills.  ?She is having significant increase in diarrhea side effects that is interrupting her ability to work. She has recently made decision to drop back to part time  ? ? ?Review of Systems  ?Constitutional:  Negative for chills, diaphoresis, fever and weight loss.  ?HENT:  Negative for tinnitus.   ?Eyes:  Negative for blurred vision and photophobia.  ?Respiratory:  Negative for cough and sputum production.   ?Cardiovascular:  Negative for chest pain, claudication and leg swelling.  ?Gastrointestinal:  Negative for diarrhea, nausea and vomiting.  ?Genitourinary:  Negative for dysuria.  ?Musculoskeletal:  Negative for joint pain.   ?Skin:  Negative for rash.  ?Neurological:  Negative for headaches.  ? ? ? ?Past Medical History:  ?Diagnosis Date  ? Complication of anesthesia   ? nausea and vomiting  ? DVT (deep venous thrombosis) ([place])   ? History of osteomyelitis   ? Kidney transplant recipient   ? 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([place])   ? ? ?Outpatient Medications Prior to Visit  ?Medication Sig Dispense Refill  ? acetaminophen (TYLENOL) 500 MG tablet Take 2 tablets (1,000 mg total) by mouth 3 (three) times daily. 30 tablet 0  ? Cholecalciferol 25 MCG (1000 UT) capsule Take by mouth.    ? fluticasone (FLONASE) 50 MCG/ACT nasal spray Place 1 spray into both nostrils daily as needed.    ? levofloxacin (LEVAQUIN) 500 MG tablet Take 1 tablet (500 mg total) by mouth daily. Do not take with milk products. 30 tablet 0  ? metoprolol succinate (TOPROL-XL) 25 MG 24 hr tablet Take 12.5 mg by mouth in the morning and at bedtime.    ? metroNIDAZOLE (FLAGYL) 500 MG tablet Take 1 tablet (500 mg total) by mouth 2 (two) times daily. 60 tablet 0  ? predniSONE (DELTASONE) 5 MG tablet Take 5 mg by mouth daily with breakfast.    ? PROGRAF 1 MG capsule Take 1 mg by mouth 2 (two) times daily. '2mg'$  in morning ?'1mg'$  at night    ? sodium bicarbonate 650 MG tablet Take 650 mg by mouth 2 (two) times daily.    ? apixaban (ELIQUIS) 2.5 MG TABS tablet TAKE 1 TABLET (  2.5 MG TOTAL) BY MOUTH DAILY. 60 tablet 0  ? cyclobenzaprine (FLEXERIL) 5 MG tablet Take 1-2 tablets (5-10 mg total) by mouth 2 (two) times daily as needed for muscle spasms. (Patient not taking: Reported on 06/16/2021) 24 tablet 0  ? pantoprazole (PROTONIX) 40 MG tablet TAKE 1 TABLET (40 MG TOTAL) BY MOUTH 2 (TWO) TIMES DAILY BEFORE A MEAL. 60 tablet 0  ? ?No facility-administered medications prior to visit.  ?  ? ?Allergies  ?Allergen Reactions  ? Oxycodone-Acetaminophen Itching, Nausea Only, Other (See Comments), Nausea And Vomiting and Rash  ?  Other Reaction: HA ?Other Reaction: HA ?  ? Zosyn [Piperacillin Sod-Tazobactam So]  Other (See Comments)  ?  Thrombocytopenia  ? Hydromorphone Nausea And Vomiting, Rash and Other (See Comments)  ? Amlodipine Other (See Comments)  ?  Ankle swelling  ? Codeine   ?  vomiting  ? ? ?Social History  ? ?Tobacco Use  ? Smoking status: Never  ? Smokeless tobacco: Never  ?Vaping Use  ? Vaping Use: Never used  ?Substance Use Topics  ? Alcohol use: Not Currently  ? Drug use: Never  ? ? ? ?Objective:  ? ?Vitals:  ? 08/09/21 1508  ?BP: (!) 135/93  ?Pulse: 98  ?Temp: 98.2 ?F (36.8 ?C)  ?TempSrc: Temporal  ?SpO2: 100%  ?Weight: 156 lb (70.8 kg)  ? ?Body mass index is 25.99 kg/m?. ? ?Physical Exam ?Constitutional:   ?   Appearance: Normal appearance. She is not ill-appearing.  ?HENT:  ?   Mouth/Throat:  ?   Mouth: Mucous membranes are moist.  ?   Pharynx: Oropharynx is clear.  ?Eyes:  ?   General: No scleral icterus. ?Pulmonary:  ?   Effort: Pulmonary effort is normal.  ?Neurological:  ?   Mental Status: She is oriented to person, place, and time.  ?Psychiatric:     ?   Mood and Affect: Mood normal.     ?   Thought Content: Thought content normal.  ? ? ? ? ?Lab Results: ?Lab Results  ?Component Value Date  ? WBC 6.7 06/08/2020  ? HGB 7.8 (L) 06/08/2020  ? HCT 23.8 (L) 06/08/2020  ? MCV 90.2 06/08/2020  ? PLT 177 06/08/2020  ?  ?Lab Results  ?Component Value Date  ? CREATININE 2.93 (H) 06/08/2020  ? BUN 28 (H) 06/08/2020  ? NA 133 (L) 06/08/2020  ? K 5.5 (H) 06/08/2020  ? CL 99 06/08/2020  ? CO2 26 06/08/2020  ?  ?Lab Results  ?Component Value Date  ? ALT 12 05/06/2020  ? AST 17 05/06/2020  ? ALKPHOS 213 (H) 05/06/2020  ? BILITOT 0.8 05/06/2020  ?  ? ?Assessment & Plan:  ? ?Problem List Items Addressed This Visit   ? ?  ? Unprioritized  ? 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 ([place])  ?  Completed empiric treatment with Levaquin + Metronidazole nearly 16 weeks. Overall wound condition improved during treatment and PET scan 4w ago showed improvement in metabolic activity about the pubic symphysis but  persistent metabolic activity in the pubic ramus concerning for unresolved OM as well as a new pathologic fracture.  ? ?She has unfortunately since had 2 splintered fragments of pubic bone come out through her wound in the setting of another fracture (previously with b/l acetabular fractures).  ?We had a long discussion about this today - it is not quite clear as to how much of her current problem is due to unresolved infection vs compound effect of  other fragility risk factors (chronic steroids/rejection meds, post-menopausal state, irradiated bone). Unclear if there is to be expected radiographic lag with PET and bone may be sterile. I am very concerned with ongoing pelvic bone preservation and architecture. She agreed with meeting with orthopedic surgeon to help with further treatment decisions. Unclear if any surgical benefit here but would be utility for bone biopsy and culture to attempt to better understand next steps and if antibiotics are further needed. Her inflammatory markers for I am sure many reasons have not ever had a satisfying resolution.  Bone mineral density optimization I would also imagine needs addressing.  ? ?Will have her take a break from antibiotics and hold for 2 weeks until appointment with Dr. Sharol Given.  ?We discussed she may be best served at multidisciplinary clinic [facility]?) but would look to Dr. Sharol Given for recommendations here.  ? ?  ?  ? Wound of right groin  ?  GYN Onc following - Finally has been accepted with wound care clinic at [facility] for consideration of hyperbaric treatments. ? ?  ?  ? ?Total Encounter Time: 30 min ? ?[name], MSN, NP-C ?[place] for Infectious Disease ?[facility] ?Pager: [phone] ?Office: [phone] ? ?08/12/21  ?2:13 PM ? ?

## 2021-08-12 ENCOUNTER — Encounter: Payer: Self-pay | Admitting: Infectious Diseases

## 2021-08-12 NOTE — Assessment & Plan Note (Signed)
GYN Onc following - Finally has been accepted with wound care clinic at [facility] for consideration of hyperbaric treatments. ?

## 2021-08-12 NOTE — Assessment & Plan Note (Addendum)
Completed empiric treatment with Levaquin + Metronidazole nearly 16 weeks. 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  ? ?She has unfortunately since had 2 splintered fragments of pubic bone come out through her wound in the setting of another fracture (previously with b/l acetabular fractures).  ?We had a long discussion about this today - it is not quite clear as to how much of her current problem is due to unresolved infection vs compound effect of other fragility risk factors (chronic steroids/rejection meds, post-menopausal state, irradiated bone). Unclear if there is to be expected radiographic lag with PET and bone may be sterile. I am very concerned with ongoing pelvic bone preservation and architecture. She agreed with meeting with orthopedic surgeon to help with further treatment decisions. Unclear if any surgical benefit here but would be utility for bone biopsy and culture to attempt to better understand next steps and if antibiotics are further needed. Her inflammatory markers for I am sure many reasons have not ever had a satisfying resolution.  Bone mineral density optimization I would also imagine needs addressing.  ? ?Will have her take a break from antibiotics and hold for 2 weeks until appointment with Dr. Sharol Given.  ?We discussed she may be best served at multidisciplinary clinic [facility]? [facility]?) but would look to Dr. Sharol Given for recommendations here.  ?

## 2021-08-16 ENCOUNTER — Encounter (HOSPITAL_BASED_OUTPATIENT_CLINIC_OR_DEPARTMENT_OTHER): Payer: Self-pay | Admitting: Obstetrics and Gynecology

## 2021-08-16 ENCOUNTER — Emergency Department (HOSPITAL_BASED_OUTPATIENT_CLINIC_OR_DEPARTMENT_OTHER)
Admission: EM | Admit: 2021-08-16 | Discharge: 2021-08-16 | Disposition: A | Discharge status: Home / Self Care | Attending: Emergency Medicine | Admitting: Emergency Medicine

## 2021-08-16 ENCOUNTER — Emergency Department (HOSPITAL_BASED_OUTPATIENT_CLINIC_OR_DEPARTMENT_OTHER): Admitting: Radiology

## 2021-08-16 ENCOUNTER — Other Ambulatory Visit (HOSPITAL_BASED_OUTPATIENT_CLINIC_OR_DEPARTMENT_OTHER): Payer: Self-pay

## 2021-08-16 ENCOUNTER — Other Ambulatory Visit: Payer: Self-pay

## 2021-08-16 DIAGNOSIS — S51811A Laceration without foreign body of right forearm, initial encounter: Secondary | ICD-10-CM | POA: Insufficient documentation

## 2021-08-16 DIAGNOSIS — W548XXA Other contact with dog, initial encounter: Secondary | ICD-10-CM | POA: Diagnosis not present

## 2021-08-16 DIAGNOSIS — S61412A Laceration without foreign body of left hand, initial encounter: Secondary | ICD-10-CM | POA: Insufficient documentation

## 2021-08-16 DIAGNOSIS — S59911A Unspecified injury of right forearm, initial encounter: Secondary | ICD-10-CM | POA: Diagnosis present

## 2021-08-16 IMAGING — DX DG FOREARM 2V*R*
2 series · 2 of 2 positions shown · non-contrast
Comparison: None

CLINICAL DATA: Dog bite while trying to break up a dog fight
question fracture or foreign body

EXAM:
RIGHT FOREARM - 2 VIEW

[forearm ap]
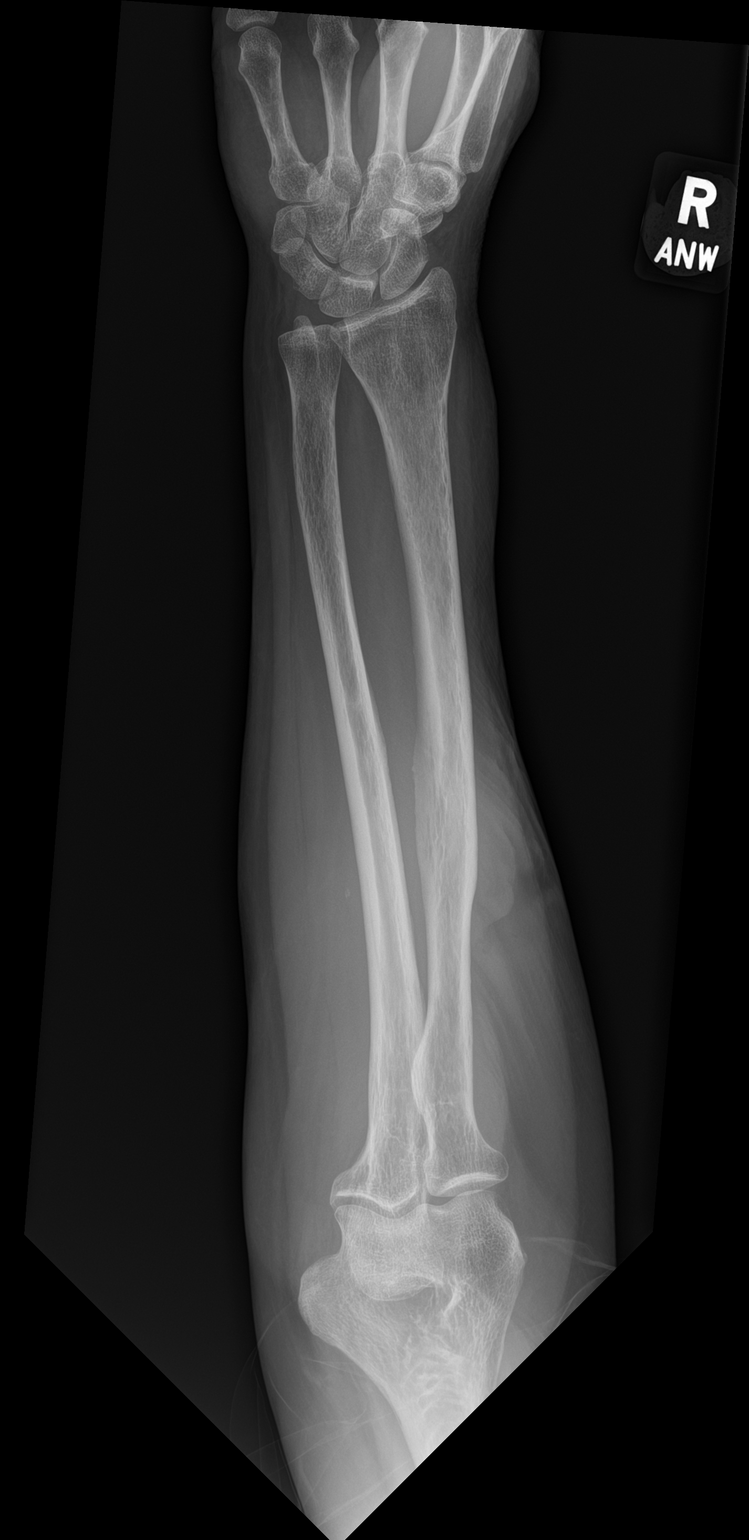

[forearm lat]
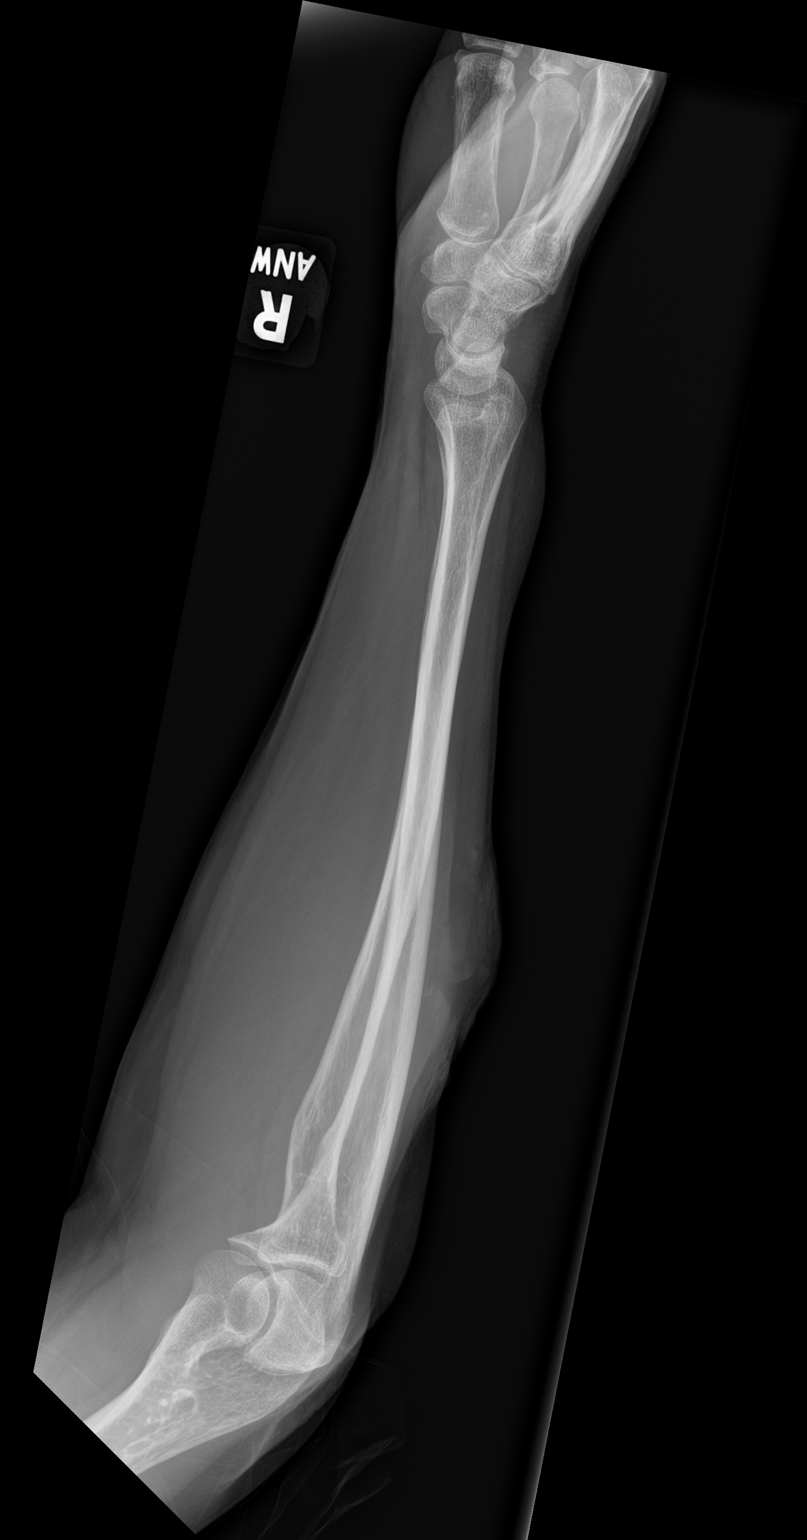

[2 of 2 positions shown; findings below may reference images not displayed]

FINDINGS: Osseous mineralization normal.

Joint spaces preserved.

No fracture, dislocation, or bone destruction.

Soft tissue swelling, irregularity and laceration at dorsal radial
aspect of mid forearm.

No radiopaque foreign body.
IMPRESSION: No osseous abnormalities or radiopaque foreign body.

## 2021-08-16 MED ORDER — LIDOCAINE-EPINEPHRINE-TETRACAINE (LET) TOPICAL GEL
3.0000 mL | Freq: Once | TOPICAL | Status: AC
  Administered 2021-08-16: 3 mL via TOPICAL
  Filled 2021-08-16: qty 3

## 2021-08-16 MED ORDER — AMOXICILLIN-POT CLAVULANATE 500-125 MG PO TABS
1.0000 | ORAL_TABLET | Freq: Two times a day (BID) | ORAL | 0 refills | Status: DC
Start: 2021-08-16 — End: 2021-09-15

## 2021-08-16 NOTE — Discharge Instructions (Addendum)
You were seen in the ER today for evaluation of the lacerations on your left hand and right arm. We have started you on an antibiotic called Augmentin that you will take once every 12 hours for the next 7 days. You can take Tylenol as needed for pain. I have attached care instructions to this packet. Please make sure to not soak your arms/hand in dishwater, pools, lakes, ponds, baths, etc. Please make sure to keep the area relatively dry, unless cleaning once daily or if visibly soiled. Please have your wound care specialists re-evaluate at your appointment Monday. If you have any concern, new or worsening symptoms, please return to the ER.  ? ?Contact a health care provider if: ?You received a tetanus shot and you have swelling, severe pain, redness, or bleeding at the injection site. ?Your pain is not controlled with medicine. ?You have any of these signs of infection: ?More redness, swelling, or pain around your wound. ?Fluid or blood coming from your wound. ?Warmth coming from your wound. ?Pus or a bad smell coming from your wound. ?A fever. ?You notice something coming out of the wound, such as wood or glass. ?You notice a change in the color of your skin near your wound. ?You develop a new rash. ?You need to change the dressing often. ?You develop numbness around your wound. ?Get help right away if: ?Your pain suddenly increases and is severe. ?You develop severe swelling around the wound. ?The wound is on your hand or foot, and you cannot properly move a finger or toe. ?The wound is on your hand or foot, and you notice that your fingers or toes look pale or bluish. ?You have a red streak going away from your wound. ?You develop painful lumps near the wound or on skin anywhere else on your body. ?

## 2021-08-16 NOTE — ED Provider Notes (Signed)
[place] EMERGENCY DEPT Provider Note   CSN: [id number] Arrival date & time: 08/16/21  4098     History  Chief Complaint  Patient presents with   Animal Bite    Christina Mack is a 56 y.o. female with history of kidney transplant on immunosuppressant, vulvar cancer, DVT on Eliquis, osteomyelitis, presents to the emergency department for evaluation of dog scratches to her right forearm and left hand from today.  She reports that she was trying to separate her dogs from fighting and 1 dog jumped over her and scratched her arm.  She is adamant that it was not a bite and that it was a scratch.  She reports that her dogs are up-to-date on all of her shots.  She reports she had her tetanus in 2019.  She reports she recently had osteomyelitis in her pelvis from her chemo and radiation and finished her antibiotics just recently.   Animal Bite     Home Medications Prior to Admission medications   Medication Sig Start Date End Date Taking? Authorizing Provider  acetaminophen (TYLENOL) 500 MG tablet Take 2 tablets (1,000 mg total) by mouth 3 (three) times daily. 05/05/20   Gaylan Gerold, DO  apixaban (ELIQUIS) 2.5 MG TABS tablet TAKE 1 TABLET (2.5 MG TOTAL) BY MOUTH DAILY. 05/17/20 05/17/21  Love, Ivan Anchors, PA-C  Cholecalciferol 25 MCG (1000 UT) capsule Take by mouth.    [provider]  cyclobenzaprine (FLEXERIL) 5 MG tablet Take 1-2 tablets (5-10 mg total) by mouth 2 (two) times daily as needed for muscle spasms. Patient not taking: Reported on 06/16/2021 12/17/20   Wieters, Hallie C, PA-C  fluticasone (FLONASE) 50 MCG/ACT nasal spray Place 1 spray into both nostrils daily as needed. 05/16/17   [provider]  levofloxacin (LEVAQUIN) 500 MG tablet Take 1 tablet (500 mg total) by mouth daily. Do not take with milk products. 07/22/21   [place] Callas, NP  metoprolol succinate (TOPROL-XL) 25 MG 24 hr tablet Take 12.5 mg by mouth in the morning and at bedtime.     [provider]  metroNIDAZOLE (FLAGYL) 500 MG tablet Take 1 tablet (500 mg total) by mouth 2 (two) times daily. 07/22/21    Callas, NP  pantoprazole (PROTONIX) 40 MG tablet TAKE 1 TABLET (40 MG TOTAL) BY MOUTH 2 (TWO) TIMES DAILY BEFORE A MEAL. 05/17/20 05/17/21  Love, Ivan Anchors, PA-C  predniSONE (DELTASONE) 5 MG tablet Take 5 mg by mouth daily with breakfast.    [provider]  PROGRAF 1 MG capsule Take 1 mg by mouth 2 (two) times daily. '2mg'$  in morning '1mg'$  at night 01/31/20   [provider]  sodium bicarbonate 650 MG tablet Take 650 mg by mouth 2 (two) times daily. 02/07/21   [provider]      Allergies    Oxycodone-acetaminophen, Zosyn [piperacillin sod-tazobactam so], Hydromorphone, Amlodipine, and Codeine    Review of Systems   Review of Systems  Musculoskeletal:  Positive for myalgias.  Skin:  Positive for wound.   Physical Exam Updated Vital Signs BP (!) 152/105   Pulse 88   Temp 98.5 F (36.9 C) (Oral)   Resp 17   Ht '5\' 5"'$  (1.651 m)   Wt 70.3 kg   SpO2 100%   BMI 25.79 kg/m  Physical Exam Constitutional:      General: She is not in acute distress.    Appearance: Normal appearance. She is not ill-appearing or toxic-appearing.  Eyes:  General: No scleral icterus. Pulmonary:     Effort: Pulmonary effort is normal. No respiratory distress.  Musculoskeletal:        General: Tenderness and signs of injury present. Normal range of motion.     Comments: Sensation intact to bilateral arms and hands. Radial pulses intact. Cap refill brisk. Compartments are soft. Finger to thumb opposition intact bilaterally. Flexion and extension of wrists intact. No Snuffbox tenderness.   Skin:    General: Skin is dry.     Findings: No rash.     Comments: Approximately 10cm jagged laceration noted to the right forearm with approximately golf ball sized, soft hematoma to the ulnar aspect of the wound. There is V-shaped more skin tear than  laceration on the dorsum of the left hand. Small abrasion noted to the thumb. Dog hair present in the wounds.  Neurological:     General: No focal deficit present.     Mental Status: She is alert. Mental status is at baseline.  Psychiatric:        Mood and Affect: Mood normal.          ED Results / Procedures / Treatments   Labs (all labs ordered are listed, but only abnormal results are displayed) Labs Reviewed - No data to display  EKG None  Radiology DG Forearm Right  Result Date: 08/16/2021 CLINICAL DATA:  Dog bite while trying to break up a dog fight question fracture or foreign body EXAM: RIGHT FOREARM - 2 VIEW COMPARISON:  None FINDINGS: Osseous mineralization normal. Joint spaces preserved. No fracture, dislocation, or bone destruction. Soft tissue swelling, irregularity and laceration at dorsal radial aspect of mid forearm. No radiopaque foreign body. IMPRESSION: No osseous abnormalities or radiopaque foreign body. Electronically Signed   By: Lavonia Dana M.D.   On: 08/16/2021 09:53   Procedures .Marland KitchenLaceration Repair  Date/Time: 08/18/2021 6:55 PM Performed by: Sherrell Puller, PA-C Authorized by: Sherrell Puller, PA-C   Consent:    Consent obtained:  Verbal   Consent given by:  Patient   Risks discussed:  Infection, need for additional repair, pain, poor cosmetic result, poor wound healing and retained foreign body   Alternatives discussed:  No treatment Universal protocol:    Procedure explained and questions answered to patient or proxy's satisfaction: yes     Relevant documents present and verified: no     Test results available: no     Imaging studies available: no     Required blood products, implants, devices, and special equipment available: no     Site/side marked: no     Immediately prior to procedure, a time out was called: no     Patient identity confirmed:  Verbally with patient Anesthesia:    Anesthesia method:  Topical application   Topical  anesthetic:  LET Laceration details:    Location:  Hand   Hand location:  L hand, dorsum   Length (cm):  3 Pre-procedure details:    Preparation:  Patient was prepped and draped in usual sterile fashion Exploration:    Contaminated: yes   Treatment:    Area cleansed with:  Saline   Irrigation solution:  Sterile saline   Irrigation volume:  250   Irrigation method:  Pressure wash and syringe Skin repair:    Repair method:  Steri-Strips   Number of Steri-Strips:  1 Approximation:    Approximation:  Loose Repair type:    Repair type:  Simple Post-procedure details:    Dressing:  Bulky dressing  Procedure completion:  Procedure terminated at patient's request Comments:     Patient could not tolerate irrigation and cleansing of the wound secondary to pain. She declines anything additional for pain control to allow thorough cleaning.  Marland Kitchen.Laceration Repair  Date/Time: 08/18/2021 6:57 PM Performed by: Sherrell Puller, PA-C Authorized by: Sherrell Puller, PA-C   Consent:    Consent obtained:  Verbal   Consent given by:  Patient   Risks, benefits, and alternatives were discussed: yes     Risks discussed:  Infection, need for additional repair, pain, poor cosmetic result, poor wound healing and retained foreign body   Alternatives discussed:  No treatment and delayed treatment Universal protocol:    Procedure explained and questions answered to patient or proxy's satisfaction: yes     Relevant documents present and verified: no     Test results available: no     Imaging studies available: yes     Required blood products, implants, devices, and special equipment available: no     Site/side marked: no     Immediately prior to procedure, a time out was called: no     Patient identity confirmed:  Verbally with patient Anesthesia:    Anesthesia method:  Topical application   Topical anesthetic:  LET Laceration details:    Location:  Shoulder/arm   Shoulder/arm location:  L upper arm    Length (cm):  10 Pre-procedure details:    Preparation:  Patient was prepped and draped in usual sterile fashion and imaging obtained to evaluate for foreign bodies Exploration:    Imaging outcome: foreign body noted (Noted on the imaging read by my attending and I, but not by the radiologist's)     Contaminated: yes   Treatment:    Area cleansed with:  Saline   Amount of cleaning:  Standard   Irrigation solution:  Sterile saline   Irrigation volume:  500   Irrigation method:  Pressure wash   Visualized foreign bodies/material removed: no   Skin repair:    Repair method:  Steri-Strips   Number of Steri-Strips:  4 Approximation:    Approximation:  Loose Repair type:    Repair type:  Simple Post-procedure details:    Dressing:  Bulky dressing   Procedure completion:  Procedure terminated at patient's request Comments:     Patient could not tolerate irrigation and cleansing of the wound secondary to pain. She declines anything additional for pain control to allow thorough cleaning.    Medications Ordered in ED Medications  lidocaine-EPINEPHrine-tetracaine (LET) topical gel (3 mLs Topical Given 08/16/21 1024)    ED Course/ Medical Decision Making/ A&P                           Medical Decision Making Amount and/or Complexity of Data Reviewed Radiology: ordered.  Risk Prescription drug management.   56 y/o F presents to the ED for evaluation of lacerations to bilateral arms from dog scratches earlier today.  Vital signs show mildly elevated blood pressure 150/105 although patient is in some pain. Afebrile, normal pulse rate, satting well room air without any increased work of breathing.  Physical exam is pertinent for approximately 10cm jagged laceration noted to the right forearm with approximately golf ball sized, soft hematoma to the ulnar aspect of the wound. There is V-shaped more skin tear than laceration on the dorsum of the left hand. Small abrasion noted to the thumb. Dog  hair present in the wounds. Sensation intact  to bilateral arms and hands. Radial pulses intact. Cap refill brisk. Compartments are soft. Finger to thumb opposition intact bilaterally. Flexion and extension of wrists intact. No Snuffbox tenderness.    We will order x-ray to rule out any fracture given the hematoma.  I independently reviewed and interpreted the patient's imaging.  No fracture seen, there is some soft tissue irregularity, consistent with her overlying laceration.  Concern for possible foreign body seen more in the lateral view than AP.  Additionally, concern for possible infection given the contamination of the wound with dog hair.  The patient is very adamant that she does not want much cleaning because of the pain.  We are finally able to settle on a compromise of using topical lidocaine on as an attempt to clean it.  The patient is afraid of needles and she is insistent that she will not have any needles regardless if they will help with the pain to allow me to clean.   I allowed the LET to sit on the wound for 20-30 minutes.  I attempted to clean the wound.  I was able to get around 500 mL of normal saline irrigation to the wound on the right forearm, and around 250 mL of irrigation on the left hand. I was able to remove the visible dog hair from the outside of the wound.  Again, concern for possible foreign body retention in the x-ray.  I discussed with patient that there are risks without having good cleanout such as infection, loss of arm, retained foreign body, or need for repair.  Patient verbalized her understanding and agrees to plan.  Family member at bedside tries to convince her parent to be more amenable to more lidocaine for a more thorough cleanout, but the patient again refuses.  She reports she would just like to go home, but is thankful.  She did allow me to loosely close the area with some Steri-Strips to right forearm and left hand. Bulky dressing placed.   Given the  patient's immunocompromisation with being a kidney transplant, recent chemoradiation, I think it is needed to place the patient on Augmentin after speaking with the pharmacist, Jenny Reichmann.  We will start the patient on the 500 mg dose every 12 hours for 7 days given her baseline kidney function.  We discussed wound care of her lacerations, the patient is a Wound Care PTA by trade. We discussed strict return precautions and red flag symptoms of infection and compartment syndrome. Discussed her Augmentin prescription. Recommended Tylenol for pain given that she is on a blood thinner. The patient is seeing a Wound Care Specialist at [facility] on Friday for her radiation induced vulvar wound. She reports she will get her wounds rechecked by them then.  Patient verbalizes understanding of the return precautions and the plan and agrees.  The patient is stable and being discharged home in good condition.  I discussed this case with my attending physician who cosigned this note including patient's presenting symptoms, physical exam, and planned diagnostics and interventions. Attending physician stated agreement with plan or made changes to plan which were implemented.   Attending physician assessed patient at bedside.  Final Clinical Impression(s) / ED Diagnoses Final diagnoses:  Dog scratch    Rx / DC Orders ED Discharge Orders          Ordered    amoxicillin-clavulanate (AUGMENTIN) 500-125 MG tablet  Every 12 hours        08/16/21 1146  Sherrell Puller, PA-C 08/18/21 1900    Pattricia Boss, MD 08/20/21 [phone]

## 2021-08-16 NOTE — ED Notes (Signed)
RN provided AVS using Teachback Method. Patient verbalizes understanding of Discharge Instructions. Opportunity for Questioning and Answers were provided by RN. Patient Discharged from ED ambulatory to Home with Family. ? ?

## 2021-08-16 NOTE — ED Triage Notes (Signed)
Patient reports to the ER for lacerations to her arm and hand after trying to break up a fight between her dogs. Patient reports the dogs are up to date on everything. Patient reports she is up to date on tetanus ?

## 2021-08-19 NOTE — Progress Notes (Signed)
08/19/2021   Labs from [facility] received from 5/13 draw.   Pleasantly surprised that her CRP has normalized and ESR is only mildly elevated @ 48 ( normal < 40). This does provide a degree of reassurance that her infection has been well treated.   Other pertinent labs:  WBC 5.1 PLT 164K SCr 2.79  CRP 5 (N < 10) ESR 48 (N < 40)

## 2021-08-22 ENCOUNTER — Encounter: Payer: Self-pay | Admitting: Orthopedic Surgery

## 2021-08-22 ENCOUNTER — Ambulatory Visit (INDEPENDENT_AMBULATORY_CARE_PROVIDER_SITE_OTHER): Admitting: Orthopedic Surgery

## 2021-08-22 DIAGNOSIS — M86151 Other acute osteomyelitis, right femur: Secondary | ICD-10-CM

## 2021-08-22 NOTE — Progress Notes (Signed)
Office Visit Note   Patient: Christina Mack           Date of Birth: [birth year]           MRN: [id number] Visit Date: 08/22/2021              Requested by: Norina Buzzard, MD [place],  [place] [address] PCP: System, Provider Not In  Chief Complaint  Patient presents with   Pelvis - Pain      HPI: Patient is a 56 year old woman who was seen for initial evaluation for osteomyelitis of the pelvis with open wound.  Patient is status post cancer treatment and has had multiple treatments with chemo and radiation therapy.  Patient has developed wound breakdown and deep osteomyelitis of the pelvis.  Patient has been on an aggressive course of antibiotics and just had a consult with hyperbaric oxygen.  Assessment & Plan: Visit Diagnoses:  1. Acute osteomyelitis, pelvis, right (HCC)     Plan: Discussed with the patient with the extensive bony involvement it would be possible to excise the symphysis pubis and the inferior pubic rami.  Patient would have extensive problems with wound healing and recommend that she continue with her antibiotic and trial with hyperbaric oxygen.  Patient states that they are trying for 16 visits.  I can follow-up as needed if she develops any further bony changes.  Follow-Up Instructions: Return if symptoms worsen or fail to improve.   Ortho Exam  Patient is alert, oriented, no adenopathy, well-dressed, normal affect, normal respiratory effort. Examination patient's wound bed has healthy granulation tissue I can palpate the inferior pubic rami but there is healthy granulation tissue overlying it there is no purulence no abscess the wound bed is healthy and viable.  Review of the MRI scan shows osteomyelitis of the inferior pubic rami with the infection extending to the symphysis pubis.  There is also some bony changes extending up to the dome of the acetabulum.  Imaging: No results found. No images are attached to  the encounter.  Labs: Lab Results  Component Value Date   ESRSEDRATE 120 (H) 06/08/2020   ESRSEDRATE 70 (H) 05/05/2020   ESRSEDRATE 73 (H) 04/27/2020   CRP 13.3 (H) 06/08/2020   CRP 4.9 (H) 05/13/2020   CRP 4.4 (H) 05/06/2020   REPTSTATUS 05/10/2020 FINAL 04/30/2020   GRAMSTAIN NO WBC SEEN NO ORGANISMS SEEN  04/30/2020   CULT  04/30/2020    RARE ENTEROCOCCUS FAECIUM VANCOMYCIN RESISTANT ENTEROCOCCUS ISOLATED NO ANAEROBES ISOLATED SEE SEPARATE REPORT FOR DAPTOMYCIN RESULT Performed at [employer] Performed at [facility], [place] [address], [place], [place] [address]    LABORGA ENTEROCOCCUS FAECIUM 04/30/2020     Lab Results  Component Value Date   ALBUMIN 1.9 (L) 05/06/2020   ALBUMIN 2.0 (L) 04/24/2020   ALBUMIN 1.9 (L) 04/23/2020    Lab Results  Component Value Date   MG 1.8 04/23/2020   MG 1.9 04/22/2020   MG 1.4 (L) 04/21/2020   No results found for: VD25OH  No results found for: PREALBUMIN    Latest Ref Rng & Units 06/08/2020    3:44 PM 05/17/2020    5:48 AM 05/13/2020    5:08 AM  CBC EXTENDED  WBC 3.8 - 10.8 Thousand/uL 6.7   5.3   7.0    RBC 3.80 - 5.10 Million/uL 2.64   2.64   2.79    Hemoglobin 11.7 - 15.5 g/dL 7.8  7.8   8.4    HCT 35.0 - 45.0 % 23.8   24.6   25.0    Platelets 140 - 400 Thousand/uL 177   139   134    NEUT# 1.7 - 7.7 K/uL   3.1    Lymph# 0.7 - 4.0 K/uL   3.3       There is no height or weight on file to calculate BMI.  Orders:  No orders of the defined types were placed in this encounter.  No orders of the defined types were placed in this encounter.    Procedures: No procedures performed  Clinical Data: No additional findings.  ROS:  All other systems negative, except as noted in the HPI. Review of Systems  Objective: Vital Signs: There were no vitals taken for this visit.  Specialty Comments:  No specialty comments available.  PMFS History: Patient Active Problem List   Diagnosis Date Noted    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 ([place]) 05/17/2021   History of healed fragility fracture 05/17/2021   Leg weakness, bilateral 09/15/2020   Screening for malignant neoplasm of skin 05/31/2020   Immunocompromised state due to drug therapy ([place]) 05/18/2020   Chronic kidney disease (CKD), stage IV (severe) (HCC)    Chronic pain syndrome    Cancer associated pain    Debility    Acute on chronic anemia    Normocytic anemia 04/26/2020   CKD (chronic kidney disease) stage 4, GFR 15-29 ml/min ([place]) 04/26/2020   Wound of right groin 04/26/2020   Gastric ulcer without hemorrhage or perforation 04/26/2020   Malnutrition of moderate degree 04/26/2020   Acute deep vein thrombosis (DVT) of femoral vein ([place]) 12/05/2019   Iron deficiency anemia 11/10/2019   Sprain of anterior talofibular ligament of right ankle 11/22/2016   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   Aftercare following organ transplant 02/24/2013   Immunosuppression ([place]) 02/24/2013   Ureteral stent displacement ([place]) 02/24/2013   Kidney transplant status 11/22/2012   HTN (hypertension) 11/22/2012   Hyperparathyroidism, 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   Past Medical History:  Diagnosis Date   Complication of anesthesia    nausea and vomiting   DVT (deep venous thrombosis) (HCC)    History of osteomyelitis    Kidney transplant recipient    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([place])     Family History  Problem Relation Age of Onset   Cerebral aneurysm Mother    AAA (abdominal aortic aneurysm) Brother    Breast cancer Paternal Aunt    Heart attack Maternal Grandmother    Brain cancer Maternal Grandfather    Colon cancer Neg Hx    Ovarian cancer Neg Hx    Endometrial cancer Neg Hx    Pancreatic cancer Neg Hx    Prostate cancer Neg Hx     Past Surgical History:  Procedure Laterality Date   BIOPSY  04/24/2020   Procedure: BIOPSY;  Surgeon: Doran Stabler, MD;  Location: [facility];  Service: Gastroenterology;;   ESOPHAGOGASTRODUODENOSCOPY (EGD) WITH PROPOFOL N/A 04/24/2020   Procedure: ESOPHAGOGASTRODUODENOSCOPY (EGD) WITH PROPOFOL;  Surgeon: Doran Stabler, MD;  Location: [facility];  Service: Gastroenterology;  Laterality: N/A;   IR US GUIDE BX ASP/DRAIN  04/30/2020   KIDNEY TRANSPLANT  2001   KIDNEY TRANSPLANT  2014   LEEP     TOTAL ABDOMINAL HYSTERECTOMY     ovaries left in situ   Social History   Occupational History  Not on file  Tobacco Use   Smoking status: Never   Smokeless tobacco: Never  Vaping Use   Vaping Use: Never used  Substance and Sexual Activity   Alcohol use: Not Currently   Drug use: Never   Sexual activity: Not Currently

## 2021-09-15 ENCOUNTER — Encounter: Payer: Self-pay | Admitting: Gynecologic Oncology

## 2021-09-15 ENCOUNTER — Encounter (INDEPENDENT_AMBULATORY_CARE_PROVIDER_SITE_OTHER)

## 2021-09-15 DIAGNOSIS — M86151 Other acute osteomyelitis, right femur: Secondary | ICD-10-CM

## 2021-09-15 DIAGNOSIS — Z792 Long term (current) use of antibiotics: Secondary | ICD-10-CM

## 2021-09-15 DIAGNOSIS — M60051 Infective myositis, right thigh: Secondary | ICD-10-CM

## 2021-09-16 ENCOUNTER — Encounter: Payer: Self-pay | Admitting: Gynecologic Oncology

## 2021-09-16 ENCOUNTER — Other Ambulatory Visit (HOSPITAL_COMMUNITY): Payer: Self-pay

## 2021-09-16 ENCOUNTER — Inpatient Hospital Stay: Attending: Gynecologic Oncology | Admitting: Gynecologic Oncology

## 2021-09-16 ENCOUNTER — Other Ambulatory Visit: Payer: Self-pay

## 2021-09-16 VITALS — BP 137/96 | HR 83 | Temp 98.3°F | Resp 16 | Ht 65.0 in | Wt 158.0 lb

## 2021-09-16 DIAGNOSIS — Z792 Long term (current) use of antibiotics: Secondary | ICD-10-CM | POA: Insufficient documentation

## 2021-09-16 DIAGNOSIS — Z923 Personal history of irradiation: Secondary | ICD-10-CM | POA: Insufficient documentation

## 2021-09-16 DIAGNOSIS — Z940 Kidney transplant status: Secondary | ICD-10-CM | POA: Diagnosis not present

## 2021-09-16 DIAGNOSIS — C519 Malignant neoplasm of vulva, unspecified: Secondary | ICD-10-CM

## 2021-09-16 DIAGNOSIS — B952 Enterococcus as the cause of diseases classified elsewhere: Secondary | ICD-10-CM | POA: Diagnosis not present

## 2021-09-16 DIAGNOSIS — Z8544 Personal history of malignant neoplasm of other female genital organs: Secondary | ICD-10-CM | POA: Insufficient documentation

## 2021-09-16 DIAGNOSIS — M60051 Infective myositis, right thigh: Secondary | ICD-10-CM | POA: Insufficient documentation

## 2021-09-16 MED ORDER — TEDIZOLID PHOSPHATE 200 MG PO TABS
1.0000 | ORAL_TABLET | Freq: Every day | ORAL | 1 refills | Status: DC
  Filled 2021-09-16: qty 16, 16d supply, fill #0
  Filled 2021-10-06: qty 16, 16d supply, fill #1

## 2021-09-16 MED ORDER — MOXIFLOXACIN HCL 400 MG PO TABS
400.0000 mg | ORAL_TABLET | Freq: Every day | ORAL | 1 refills | Status: DC
  Filled 2021-09-16: qty 30, 30d supply, fill #0

## 2021-09-16 MED ORDER — TEDIZOLID PHOSPHATE 200 MG PO TABS: 1.0000 | ORAL_TABLET | Freq: Every day | ORAL | 0 refills | Status: DC

## 2021-09-16 MED ORDER — MOXIFLOXACIN HCL 400 MG PO TABS: 400.0000 mg | ORAL_TABLET | Freq: Every day | ORAL | 0 refills | Status: DC

## 2021-09-16 NOTE — Telephone Encounter (Signed)
Christina Mack, happy to chat today about her. She's a complex patient with a lot going on! Agree with moxi plan - only $10 through her insurance. Tedizolid needs PA which we are happy to work on - PA would cover 25 days at a time with $24 copay. Thanks! Estill Bamberg

## 2021-09-16 NOTE — Telephone Encounter (Signed)
Christina Mack, you are all set to send tedizolid script - needs to be written as tedizolid '200mg'$  once daily x 25 days (with refills) to get the $24 copay. No dose adjustments needed for renal dysfunction. Thanks Christina Mack!

## 2021-09-16 NOTE — Patient Instructions (Signed)
It was good to see you today.  I do not see or feel any evidence of cancer recurrence on your exam.  The area around her open incision is significantly more edematous though.  I think wearing your compression stocking on that side will help.  Please keep me posted after you start the hyperbaric oxygen therapy.  I will plan to see you in 3 months for follow-up.  If any new and concerning symptoms start before then, please call to see me sooner.

## 2021-09-16 NOTE — Telephone Encounter (Signed)
Please see the MyChart message reply(ies) for my assessment and plan.    This patient gave consent for this Medical Advice Message and is aware that it may result in a bill to [employer], as well as the possibility of receiving a bill for a co-payment or deductible. They are an established patient, but are not seeking medical advice exclusively about a problem treated during an in person or video visit in the last seven days. I did not recommend an in person or video visit within seven days of my reply.    I spent a total of 15 minutes cumulative time within 7 days through [employer].  Problem List Items Addressed This Visit       Unprioritized   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 ([place])   Relevant Medications   Tedizolid Phosphate 200 MG TABS   Infective myositis of right thigh   Relevant Medications   Tedizolid Phosphate 200 MG TABS   Long term (current) use of antibiotics - Primary   Relevant Orders   EKG 12-Lead    [name], NP

## 2021-09-16 NOTE — Progress Notes (Signed)
Gynecologic Oncology Return Clinic Visit  09/16/21  Reason for Visit: Surveillance visit in the setting of history of vulvar cancer  Treatment History: Oncology History Overview Note  Remote history of VIN2/3- resection and laser ablation at [facility] in 2010 Seen again 09/2019 for "inflammed" and irritated vulva; treated initially with antibiotics and topical tx - seen by dermatology and referred to gyn oncology (see initially 09/2019 by Dr. Juleen China)  Initial exam by Dr. Juleen China on 09/08/19: right vulva was completely involved by a large necrotic mass extending from the inguinal crease towards the anus measuring 12x5cm. It involved the distal vagina and extended to involve the left labia as well. It was difficult to evaluate her vaginal involvement due to pain.   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([place]) (Resolved)  09/10/2019 Initial Biopsy   Vulvar biopsy: at least VIN3, biopsy demonstrates extreme ulceration, inflammation and granulation tissue. Underlying invasion very difficult to rule out.   09/16/2019 Imaging   PET: IMPRESSION:  1.  Large hypermetabolic mass in the right side of the perineum, consistent with known vulvar malignancy.  2.  Hypermetabolic right groin lymphadenopathy is consistent with nodal involvement.  3.  Tiny right apical pulmonary nodule is below PET resolution.    09/16/2019 Initial Diagnosis   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([place])   11/03/2019 - 01/15/2020 Radiation Therapy   IMRT. She received 45GY/25 fractions to the pelvic and inguinal nodes and vulvar mass. The vulvar mass and enlarged inguinal nodes received a boost dose of 1800cGY/10 fractions for total dose of 63Gy/35 fractions.  Treated in [place]. Diagnosed with DVT several weeks into treatment. Missed treatments due to feeling poorly. Received 4 Cisplatin (stopped due to elevated Cr) - 8/5, 8/11, 8/18, 8/25   04/03/2020 Imaging   CT A/P w/o contrast: 1.  Redemonstration of the large right perineal mass, with new extensive surrounding soft  tissue stranding and gas which extends along the bilateral anterior pubic bones and anterior pelvis.  I'm unable to exclude developing necrotizing fasciitis.  No definite drainable abscess collection is identified.  2.  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  3.  Diffuse edema of the pelvic mesentery with mild presacral fluid, concerning for mesenteritis.  No definite drainable intraperitoneal fluid collection identified.  4.  Suspected bowel ileus or gastroenteritis.  5.  Moderately distended gallbladder with heterogeneous dependent material suggestive of sludge and possible gallstones.  6.  Hepatosplenomegaly.  7.  Suspected anemia.  8.  Moderate diffuse urinary bladder wall thickening suggesting chronic cystitis.    04/20/2020 - 05/05/2020 Hospital Admission   Admitted in the setting of right groin wound. Had been admitted to [facility] 1/2 with concerns for soft tissue infection. Started on vanc/zosyn --> oral levo/flagyl to complete 3 weeks of treatment ( EOT 1/22). Pelvic MRI at [facility] showed multilocular abscess of left pelvis, myositis, nondisplaced fracture of inferior right pubic ramus, concern for osteomytlitis. IR drainage of abscess was performed - VRE on culture. ID recommended IV dapto --> tedezolid oupatient (as well as levo + flagyl) for 6 weeks total.    04/26/2020 Imaging   MRI pelvis w/o contrast: IMPRESSION: 1. Large area of ulceration with sinus tract seen within the right labial fold with diffuse surrounding phlegmon and extensive myositis in the bilateral adductor and anterior muscular compartments of both thighs. 2. There is a multilocular fluid collection extending in the inferior left pelvic soft tissues surrounding the pubic rami and extending to the anterior upper left thigh, consistent with a multilocular abscess. 3. Nondisplaced fracture  of the inferior right pubic rami with findings suggestive osteomyelitis involving the pubic rami  and anterior pubic symphysis. 4. Findings suggestive of osteomyelitis involving the anterior left pubic symphysis.   05/05/2020 - 05/17/2020 Hospital Admission   Admitted for inpatient rehab given debility.   07/27/2020 Imaging   PET: Significant decrease in size and avidity of vulvar lesion. Air and soft tissue inflammation. Mild PET hypermetabolic activity. Resolution of inguinal LN activity. 46m LU love groundglass nodule, mild hypermetabolic activity.   09/14/2020 Imaging   CT Chest w/o contrast: IMPRESSION:  1.  Resolution of left upper lobe nodule and associated groundglass opacity.  2.  Stable 5 mm nodular opacity right upper lobe.  3.  Stable 3 mm nodule right lower lobe.      Interval History: Patient reports overall doing well.  She denies any right groin or vulvar pain.  She endorses minimal drainage from her open incision.  She pleated antibiotic therapy about a month ago.  She has since seen an orthopedic surgeon regarding her chronic findings of osteomyelitis.  Recommendation was to continue with antibiotic therapy and hyperbaric oxygen given morbidity associated with an eating possible surgical intervention.  Patient [facility]  She was not able to get things scheduled here.  Since completing antibiotics, she notes recurrence of her right leg edema.  This had resolved previously when she started antibiotics.  She denies any vaginal bleeding or bleeding from her open wound.  Reports regular bowel and bladder function.  Past Medical/Surgical History: Past Medical History:  Diagnosis Date   Complication of anesthesia    nausea and vomiting   DVT (deep venous thrombosis) (HCC)    History of osteomyelitis    Kidney transplant recipient    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([facility]     Past Surgical History:  Procedure Laterality Date   BIOPSY  04/24/2020   Procedure: BIOPSY;  Surgeon: DDoran Stabler MD;  Location: [facility]  Service: Gastroenterology;;   ESOPHAGOGASTRODUODENOSCOPY (EGD) WITH PROPOFOL N/A 04/24/2020   Procedure: ESOPHAGOGASTRODUODENOSCOPY (EGD) WITH PROPOFOL;  Surgeon: DDoran Stabler MD;  [place] [facility]  Service: Gastroenterology;  Laterality: N/A;   IR [place] BX ASP/DRAIN  04/30/2020   KIDNEY TRANSPLANT  2001   KIDNEY TRANSPLANT  2014   LEEP     TOTAL ABDOMINAL HYSTERECTOMY     ovaries left in situ    Family History  Problem Relation Age of Onset   Cerebral aneurysm Mother    AAA (abdominal aortic aneurysm) Brother    Breast cancer Paternal Aunt    Heart attack Maternal Grandmother    Brain cancer Maternal Grandfather    Colon cancer Neg Hx    Ovarian cancer Neg Hx    Endometrial cancer Neg Hx    Pancreatic cancer Neg Hx    Prostate cancer Neg Hx     Social History   Socioeconomic History   Marital status: Married    Spouse name: Not on file   Number of children: Not on file   Years of education: Not on file   Highest education level: Not on file  Occupational History   Not on file  Tobacco Use   Smoking status: Never   Smokeless tobacco: Never  Vaping Use   Vaping Use: Never used  Substance and Sexual Activity   Alcohol use: Not Currently   Drug use: Never   Sexual activity: Not Currently  Other Topics  Concern   Not on file  Social History Narrative   Not on file   Social Determinants of Health   Financial Resource Strain: Not on file  Food Insecurity: Not on file  Transportation Needs: Not on file  Physical Activity: Not on file  Stress: Not on file  Social Connections: Not on file    Current Medications:  Current Outpatient Medications:    acetaminophen (TYLENOL) 500 MG tablet, Take 2 tablets (1,000 mg total) by mouth 3 (three) times daily., Disp: 30 tablet, Rfl: 0   fluticasone (FLONASE) 50 MCG/ACT nasal spray, Place 1 spray into both nostrils daily as needed., Disp: , Rfl:    metoprolol succinate (TOPROL-XL) 25 MG 24 hr  tablet, Take 12.5 mg by mouth in the morning and at bedtime., Disp: , Rfl:    predniSONE (DELTASONE) 5 MG tablet, Take 5 mg by mouth daily with breakfast., Disp: , Rfl:    PROGRAF 1 MG capsule, Take 1 mg by mouth 2 (two) times daily. '2mg'$  in morning '1mg'$  at night, Disp: , Rfl:    sodium bicarbonate 650 MG tablet, Take 650 mg by mouth 2 (two) times daily., Disp: , Rfl:    apixaban (ELIQUIS) 2.5 MG TABS tablet, TAKE 1 TABLET (2.5 MG TOTAL) BY MOUTH DAILY., Disp: 60 tablet, Rfl: 0   moxifloxacin (AVELOX) 400 MG tablet, Take 1 tablet (400 mg total) by mouth daily at 8 pm., Disp: 30 tablet, Rfl: 0   pantoprazole (PROTONIX) 40 MG tablet, TAKE 1 TABLET (40 MG TOTAL) BY MOUTH 2 (TWO) TIMES DAILY BEFORE A MEAL., Disp: 60 tablet, Rfl: 0   Tedizolid Phosphate 200 MG TABS, Take 1 tablet by mouth daily., Disp: 30 tablet, Rfl: 0  Review of Systems: Denies appetite changes, fevers, chills, fatigue, unexplained weight changes. Denies hearing loss, neck lumps or masses, mouth sores, ringing in ears or voice changes. Denies cough or wheezing.  Denies shortness of breath. Denies chest pain or palpitations. Denies abdominal distention, pain, blood in stools, constipation, diarrhea, nausea, vomiting, or early satiety. Denies pain with intercourse, dysuria, frequency, hematuria or incontinence. Denies hot flashes, pelvic pain, vaginal bleeding or vaginal discharge.   Denies joint pain, back pain or muscle pain/cramps. Denies itching, rash, or wounds. Denies dizziness, headaches, numbness or seizures. Denies swollen lymph nodes or glands, denies easy bruising or bleeding. Denies anxiety, depression, confusion, or decreased concentration.  Physical Exam: BP (!) 137/96 (BP Location: Left Arm, Patient Position: Sitting)   Pulse 83   Temp 98.3 F (36.8 C) (Oral)   Resp 16   Ht '5\' 5"'$  (1.651 m)   Wt 158 lb (71.7 kg)   SpO2 100%   BMI 26.29 kg/m  General: Alert, oriented, no acute distress. HEENT: Normocephalic,  atraumatic, sclera anicteric. Chest: Clear to auscultation bilaterally.  No wheezes or rhonchi. Cardiovascular: Regular rate and rhythm, no murmurs. Abdomen: soft, nontender.  Normoactive bowel sounds.  No masses or hepatosplenomegaly appreciated.   Extremities: Grossly normal range of motion.  Warm, well perfused.  2+ edema of right leg. Skin: No rashes or lesions noted. Lymphatics: No cervical, supraclavicular adenopathy.  No left inguinal adenopathy GU:  1-2+ edema of mons. External female genitalia with similar appearance from last visit.  She continues to have an open wound along the right groin with complete lack of labia majora and minora on that side.  There is continued edema of the left labia although less.  Urethra is normal in appearance.  There is a small amount of fibrinous  exudate over bone within her open wound, no purulence or bleeding.  There increased induration as well as irritation of her buttock region bilaterally, this is more prominent on the right today.  Radiation changes of the vulva noted.  Laboratory & Radiologic Studies: PET 07/20/21; IMPRESSION: 1. No findings suspicious for recurrent vulvar carcinoma or metastatic disease. 2. Persistent open wound along the right perineum extending down to the bone. There are progressive changes of probable osteomyelitis involving the right inferior pubic ramus with pathologic fractures. 3. Interval improved appearance of the pubic symphysis and adjacent pubic bones.  Assessment & Plan: Christina Mack is a 56 y.o. woman with history of stage IIIC vulvar cancer presenting for surveillance visit today with ongoing open wound. Completed treatment 01/2020.   Patient is overall doing well and remains asymptomatic.  Most recent PET scan approximately 2 months ago was negative for any recurrent disease.  She continues to follow with ID.  Is now off antibiotics.  Has had increase in her right lower extremity edema.  Discussed starting  to use her right compression stocking again.  We have previously discussed referral to our lymphedema clinic.  I have asked the patient to let me know if anything changes with regard to her symptoms and she would like referral placed.  Patient is scheduled to start hyperbaric oxygen therapy next week.     Will continue with visits every 3 months per NCCN surveillance recommendations. Patient aware of signs and symptoms of care recurrence.  24 minutes of total time was spent for this patient encounter, including preparation, face-to-face counseling with the patient and coordination of care, and documentation of the encounter.  Jeral Pinch, MD  Division of Gynecologic Oncology  Department of Obstetrics and Gynecology  [facility]

## 2021-09-16 NOTE — Telephone Encounter (Signed)
Thankfully no dose adjustments for moxifloxacin or tedizolid! WLOP per Butch Penny would be best for the current copay.

## 2021-09-16 NOTE — Addendum Note (Signed)
Addended by:  Callas on: 09/16/2021 04:19 PM   Modules accepted: Orders

## 2021-09-17 ENCOUNTER — Other Ambulatory Visit (HOSPITAL_COMMUNITY): Payer: Self-pay

## 2021-09-28 ENCOUNTER — Other Ambulatory Visit: Payer: Self-pay

## 2021-09-28 ENCOUNTER — Ambulatory Visit

## 2021-09-28 DIAGNOSIS — Z792 Long term (current) use of antibiotics: Secondary | ICD-10-CM

## 2021-09-28 NOTE — Progress Notes (Signed)
EKG completed and reviewed by provider. Placed in batch scan.   Beryle Flock, RN

## 2021-10-06 ENCOUNTER — Other Ambulatory Visit (HOSPITAL_COMMUNITY): Payer: Self-pay

## 2021-10-13 ENCOUNTER — Encounter: Payer: Self-pay | Admitting: Infectious Diseases

## 2021-10-13 ENCOUNTER — Other Ambulatory Visit: Payer: Self-pay

## 2021-10-13 ENCOUNTER — Ambulatory Visit (INDEPENDENT_AMBULATORY_CARE_PROVIDER_SITE_OTHER): Admitting: Infectious Diseases

## 2021-10-13 ENCOUNTER — Other Ambulatory Visit (HOSPITAL_COMMUNITY): Payer: Self-pay

## 2021-10-13 VITALS — BP 148/92 | HR 88 | Temp 98.1°F | Wt 160.0 lb

## 2021-10-13 DIAGNOSIS — M86151 Other acute osteomyelitis, right femur: Secondary | ICD-10-CM

## 2021-10-13 DIAGNOSIS — N184 Chronic kidney disease, stage 4 (severe): Secondary | ICD-10-CM

## 2021-10-13 DIAGNOSIS — Z9189 Other specified personal risk factors, not elsewhere classified: Secondary | ICD-10-CM

## 2021-10-13 DIAGNOSIS — M60051 Infective myositis, right thigh: Secondary | ICD-10-CM

## 2021-10-13 DIAGNOSIS — Z792 Long term (current) use of antibiotics: Secondary | ICD-10-CM

## 2021-10-13 MED ORDER — MOXIFLOXACIN HCL 400 MG PO TABS
400.0000 mg | ORAL_TABLET | Freq: Every day | ORAL | 1 refills | Status: DC
  Filled 2021-10-13 – 2021-10-25 (×2): qty 30, 30d supply, fill #0

## 2021-10-13 MED ORDER — LINEZOLID 600 MG PO TABS
600.0000 mg | ORAL_TABLET | Freq: Two times a day (BID) | ORAL | 0 refills | Status: DC
  Filled 2021-10-13 – 2021-10-25 (×2): qty 60, 30d supply, fill #0

## 2021-10-13 NOTE — Progress Notes (Signed)
Patient: Christina Mack  DOB: 06-10-1965 MRN: [id number] PCP: System, Provider Not In     Subjective:   CC: About 4 weeks into tedezolid + moxifloxacin now. Only had 16 days of the tedezolid and has been discontinued per [facility].    HPI:  She has done fine with the moxifloxacin in terms of side effects. Off tedezolid for about 2 weeks now due to being discontinued. On treatment 15 with HBOT. Wound team feels that there is no palpable bone anymore. Has 60 total treatments through the end of August to do.  Leg swelling has decreased.    Review of Systems  Constitutional:  Negative for chills, fever and weight loss.  Cardiovascular:  Negative for leg swelling.  Gastrointestinal:  Negative for abdominal pain, diarrhea, nausea and vomiting.  Musculoskeletal:  Negative for joint pain.  Skin:  Negative for rash.      Past Medical History:  Diagnosis Date   Complication of anesthesia    nausea and vomiting   DVT (deep venous thrombosis) (HCC)    History of osteomyelitis    Kidney transplant recipient    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[facility])     Outpatient Medications Prior to Visit  Medication Sig Dispense Refill   acetaminophen (TYLENOL) 500 MG tablet Take 2 tablets (1,000 mg total) by mouth 3 (three) times daily. 30 tablet 0   fluticasone (FLONASE) 50 MCG/ACT nasal spray Place 1 spray into both nostrils daily as needed.     metoprolol succinate (TOPROL-XL) 25 MG 24 hr tablet Take 12.5 mg by mouth in the morning and at bedtime.     predniSONE (DELTASONE) 5 MG tablet Take 5 mg by mouth daily with breakfast.     PROGRAF 1 MG capsule Take 1 mg by mouth 2 (two) times daily. '2mg'$  in morning '1mg'$  at night     sodium bicarbonate 650 MG tablet Take 650 mg by mouth 2 (two) times daily.     moxifloxacin (AVELOX) 400 MG tablet Take 1 tablet (400 mg total) by mouth daily at 8 pm. 30 tablet 1   Tedizolid Phosphate 200 MG TABS Take 1 tablet by mouth daily. 24 tablet 1   apixaban (ELIQUIS) 2.5 MG TABS  tablet TAKE 1 TABLET (2.5 MG TOTAL) BY MOUTH DAILY. 60 tablet 0   pantoprazole (PROTONIX) 40 MG tablet TAKE 1 TABLET (40 MG TOTAL) BY MOUTH 2 (TWO) TIMES DAILY BEFORE A MEAL. 60 tablet 0   No facility-administered medications prior to visit.     Allergies  Allergen Reactions   Oxycodone-Acetaminophen Itching, Nausea Only, Other (See Comments), Nausea And Vomiting and Rash    Other Reaction: HA Other Reaction: HA    Zosyn [Piperacillin Sod-Tazobactam So] Other (See Comments)    Thrombocytopenia   Hydromorphone Nausea And Vomiting, Rash and Other (See Comments)   Amlodipine Other (See Comments)    Ankle swelling   Codeine     vomiting    Social History   Tobacco Use   Smoking status: Never   Smokeless tobacco: Never  Vaping Use   Vaping Use: Never used  Substance Use Topics   Alcohol use: Not Currently   Drug use: Never     Objective:   Vitals:   10/13/21 1438  BP: (!) 148/92  Pulse: 88  Temp: 98.1 F (36.7 C)  TempSrc: Temporal  Weight: 160 lb (72.6 kg)   Body mass index is 26.63 kg/m.  Physical Exam Constitutional:      Appearance: Normal appearance. She  is not ill-appearing.  HENT:     Mouth/Throat:     Mouth: Mucous membranes are moist.     Pharynx: Oropharynx is clear.  Eyes:     General: No scleral icterus. Pulmonary:     Effort: Pulmonary effort is normal.  Neurological:     Mental Status: She is oriented to person, place, and time.  Psychiatric:        Mood and Affect: Mood normal.        Thought Content: Thought content normal.     Lab Results: Lab Results  Component Value Date   WBC 6.7 06/08/2020   HGB 7.8 (L) 06/08/2020   HCT 23.8 (L) 06/08/2020   MCV 90.2 06/08/2020   PLT 177 06/08/2020    Lab Results  Component Value Date   CREATININE 2.93 (H) 06/08/2020   BUN 28 (H) 06/08/2020   NA 133 (L) 06/08/2020   K 5.5 (H) 06/08/2020   CL 99 06/08/2020   CO2 26 06/08/2020    Lab Results  Component Value Date   ALT 12 05/06/2020    AST 17 05/06/2020   ALKPHOS 213 (H) 05/06/2020   BILITOT 0.8 05/06/2020     Assessment & Plan:   Problem List Items Addressed This Visit       Unprioritized   Chronic kidney disease (CKD), stage IV (severe) ([place]) - Primary   Long term (current) use of antibiotics   At risk for adverse drug interaction    QTc < 400 ms with co-administered prograf and moxifloxacin      Infective myositis of right thigh    Swelling has resolved to baseline on abx      Relevant Medications   linezolid (ZYVOX) 600 MG tablet   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 Will substitute linezolid 600 mg BID with repeat PLT count after 3 weeks. She was given a rx for this to take to her nephrologist team as they do a better job with venipuncture and she is more comfortable there.  Continue Moxifloxacin - refills sent in.   Would like to continue aggressive broad therapy throughout the HBOT and then for 2 more weeks if she can tolerate this. Hopeful to avoid any morbid surgical treatment.  Does not sound like a flap would be in her treatment plan given irradiated tissue.       Relevant Medications   linezolid (ZYVOX) 600 MG tablet     [name], MSN, NP-C [facility] for [facility] [place] Pager: [phone] Office: [phone]  10/13/21  4:28 PM

## 2021-10-13 NOTE — Assessment & Plan Note (Signed)
QTc < 400 ms with co-administered prograf and moxifloxacin

## 2021-10-13 NOTE — Assessment & Plan Note (Signed)
Called [facility] and they are indeed out and not projected to get this back in stock until later this summer. Will substitute linezolid 600 mg BID with repeat PLT count after 3 weeks. She was given a rx for this to take to her nephrologist team as they do a better job with venipuncture and she is more comfortable there.  Continue Moxifloxacin - refills sent in.   Would like to continue aggressive broad therapy throughout the HBOT and then for 2 more weeks if she can tolerate this. Hopeful to avoid any morbid surgical treatment.  Does not sound like a flap would be in her treatment plan given irradiated tissue.

## 2021-10-13 NOTE — Patient Instructions (Addendum)
CONTINUE moxifloxacin once daily  START linezolid 1 capsule twice a day - this is similar to the other antibiotic but has increased risk for side effects.   Please get your blood draw done the first week of August so we can make sure your platelets don't drop too much. Maybe they can draw your blood a little early for [place].

## 2021-10-13 NOTE — Assessment & Plan Note (Signed)
Swelling has resolved to baseline on abx

## 2021-10-21 ENCOUNTER — Other Ambulatory Visit (HOSPITAL_COMMUNITY): Payer: Self-pay

## 2021-10-25 ENCOUNTER — Other Ambulatory Visit (HOSPITAL_COMMUNITY): Payer: Self-pay

## 2021-11-09 NOTE — Progress Notes (Unsigned)
Patient: Christina Mack  DOB: 1965/08/26 MRN: [id number] PCP: System, Provider Not In     Subjective:   Chief Complaint  Patient presents with   Follow-up    Celluitis - right thigh and lower leg red, inflammed, tender to touch worse since yesterday.      HPI:   Completed 8 weeks moxifloxacin > 1 week ago. Had trouble getting the linezolid picked up from [facility] (1 week wait) then after about a week she self discontinued due to severe diarrhea. She just started this back on Monday 11/07/2021 with a probiotic, which seems to have helped so far regarding the diarrhea. Main concern today is that she started with some pain and redness on the right anterior thigh at the beginning of the week. This has since over yesterday spread down to involve the right lower leg. Feels like a severe sunburn that is throbbing and stinging. She requested yesterday to be seen earlier for this new problem but needed to wait until this morning so her husband could drive her as that is also painful to do at the moment. No fevers/chills or systemic symptoms of infection.   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 She has noticed some blurry vision a few weeks ago that is unchanged but expected to get better after she finishes HBOT. Still with small drainage to the wound with some minor bleeding if any friction to the area. Yesterday's treatment was day 31/60. She does feel these sessions have been worth it however regarding the progress in the wound healing. She has also noticed some improved ROM in her right hip/SI region and less pain here with squatting motion.   FU with oncology Sept 15th.    Review of Systems  Constitutional:  Negative for chills, fever and weight loss.  Cardiovascular:  Positive for leg swelling.  Gastrointestinal:  Negative for abdominal pain, diarrhea, nausea and vomiting.  Musculoskeletal:  Negative for  joint pain.  Skin:  Positive for rash.       R anterior thigh and R calf.       Past Medical History:  Diagnosis Date   Complication of anesthesia    nausea and vomiting   DVT (deep venous thrombosis) (HCC)    History of osteomyelitis    Kidney transplant recipient    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[facility])     Outpatient Medications Prior to Visit  Medication Sig Dispense Refill   acetaminophen (TYLENOL) 500 MG tablet Take 2 tablets (1,000 mg total) by mouth 3 (three) times daily. 30 tablet 0   fluticasone (FLONASE) 50 MCG/ACT nasal spray Place 1 spray into both nostrils daily as needed.     linezolid (ZYVOX) 600 MG tablet Take 1 tablet (600 mg total) by mouth 2 (two) times daily. 60 tablet 0   metoprolol succinate (TOPROL-XL) 25 MG 24 hr tablet Take 12.5 mg by mouth in the morning and at bedtime.     moxifloxacin (AVELOX) 400 MG tablet Take 1 tablet (400 mg total) by mouth daily at 8 pm. 30 tablet 1   predniSONE (DELTASONE) 5 MG tablet Take 5 mg by mouth daily with breakfast.     PROGRAF 1 MG capsule Take 1 mg by mouth 2 (two) times daily. '2mg'$  in morning '1mg'$  at night     sodium bicarbonate 650 MG tablet Take 650 mg by mouth 2 (two) times daily.     apixaban (ELIQUIS)  2.5 MG TABS tablet TAKE 1 TABLET (2.5 MG TOTAL) BY MOUTH DAILY. 60 tablet 0   pantoprazole (PROTONIX) 40 MG tablet TAKE 1 TABLET (40 MG TOTAL) BY MOUTH 2 (TWO) TIMES DAILY BEFORE A MEAL. 60 tablet 0   No facility-administered medications prior to visit.     Allergies  Allergen Reactions   Oxycodone-Acetaminophen Itching, Nausea Only, Other (See Comments), Nausea And Vomiting and Rash    Other Reaction: HA Other Reaction: HA    Zosyn [Piperacillin Sod-Tazobactam So] Other (See Comments)    Thrombocytopenia   Hydromorphone Nausea And Vomiting, Rash and Other (See Comments)   Amlodipine Other (See Comments)    Ankle swelling   Codeine     vomiting    Social History   Tobacco Use   Smoking status: Never   Smokeless  tobacco: Never  Vaping Use   Vaping Use: Never used  Substance Use Topics   Alcohol use: Not Currently   Drug use: Never     Objective:   Vitals:   11/10/21 0930  BP: 129/86  Pulse: 86  Resp: 16  Temp: 97.6 F (36.4 C)  TempSrc: Oral  SpO2: 100%  Weight: 158 lb 9.6 oz (71.9 kg)  Height: '5\' 5"'$  (1.651 m)   Body mass index is 26.39 kg/m.  Physical Exam Constitutional:      Appearance: Normal appearance. She is not ill-appearing.  HENT:     Mouth/Throat:     Mouth: Mucous membranes are moist.     Pharynx: Oropharynx is clear.  Eyes:     General: No scleral icterus. Cardiovascular:     Rate and Rhythm: Normal rate.  Pulmonary:     Effort: Pulmonary effort is normal.  Skin:    Findings: Erythema and rash present.     Comments: Non-purulent violaceous, well-demarcated rash noted to posterior calf. She has tenderness in dependent positions. She can More diffuse erythema noted anterior lower leg. Anterior thigh with receeding similar appearing rash but it is much more diffuse than posterior calf.   Neurological:     Mental Status: She is oriented to person, place, and time.  Psychiatric:        Mood and Affect: Mood normal.        Thought Content: Thought content normal.       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  She will finish out 3 more weeks of the linezolid (probiotic seems to help diarrhea to where she can tolerate this). Will see her back after she concludes HBOT and sees Oncology again for timing on repeat PET imaging.  Appt ~end of September 2023.       Flexural atopic dermatitis    Will give rx for topical triamcinolone ointment and OTC Cetaphil lotion to use 2-3 times a day to left flexural rash.       Erysipelas - Primary    Danecia is here for an earlier follow up on chronic pelvic osteomyelitis after she had a rapid onset of new painful red rash involving her RLE. Exam consistent with erysipelas. I was prepared to refer her for hospitalization and IV antibiotics/imaging with concern she had a breakthrough infection already on antibiotics; however after learning she was not on the linezolid when this started, no signs of systemic illness today and with improvement in condition and receeding of the rash as of this morning, I think we are safe to continue with treating this in the ambulatory setting. Her calf while quite tender is not out of proportionally painful to exam and no tissue crepitus. No fluctuant or bullous areas noted.    Most likely this is due to streptococcal infection which the linezolid cover nicely and also provide some toxin inhibition for her. Will keep her out of work so she can tend to this leg with elevation, cool compresses and rest with return expected on Monday 8/14.   Will have our team call and check in on her tomorrow as well to ensure she continues to note improvement. Precautions discussed that would warrant ER/Hospitalizatoin - pain that becomes more severe, formation of blisters, feeling unwell despite antibiotics, fevers/ rigors.   Encouraged her to let her HBOT team know to make sure that does not preclude her going forward with current treatment plan.        [name], MSN,  NP-C [facility] for Infectious [place] Pager: [phone] Office: [phone]  11/10/21  12:51 PM

## 2021-11-10 ENCOUNTER — Encounter: Payer: Self-pay | Admitting: Infectious Diseases

## 2021-11-10 ENCOUNTER — Other Ambulatory Visit: Payer: Self-pay

## 2021-11-10 ENCOUNTER — Ambulatory Visit (INDEPENDENT_AMBULATORY_CARE_PROVIDER_SITE_OTHER): Admitting: Infectious Diseases

## 2021-11-10 VITALS — BP 129/86 | HR 86 | Temp 97.6°F | Resp 16 | Ht 65.0 in | Wt 158.6 lb

## 2021-11-10 DIAGNOSIS — A46 Erysipelas: Secondary | ICD-10-CM | POA: Insufficient documentation

## 2021-11-10 DIAGNOSIS — L2089 Other atopic dermatitis: Secondary | ICD-10-CM | POA: Diagnosis not present

## 2021-11-10 DIAGNOSIS — M86151 Other acute osteomyelitis, right femur: Secondary | ICD-10-CM

## 2021-11-10 MED ORDER — TRIAMCINOLONE ACETONIDE 0.5 % EX OINT: 1.0000 | TOPICAL_OINTMENT | Freq: Two times a day (BID) | CUTANEOUS | 0 refills | Status: DC

## 2021-11-10 NOTE — Patient Instructions (Addendum)
Finish the linizolid out another 3 weeks.   I think that you will continue to improve as you are on the linezolid - that will treat the typical bacteria that causes this infection beautifully.   Would like to see you back after HBO treatments and after your oncology visit sometime at the end of September please    Erysipelas  Erysipelas is an infection that affects the skin and tissues near the surface of the skin. It causes the skin to become red, swollen, and painful. The infection is most common on the legs but may also affect other areas, such as the face. With treatment, the infection usually goes away in a few days. If not treated, the infection can spread or lead to other problems, such as collections of pus (abscesses). What are the causes? This condition is caused by bacteria. Most often, it is caused by bacteria called streptococci.  Bacteria may get into the skin through a break in the skin, such as: A cut or scrape. An incision from surgery. A burn. An insect bite. An open sore. A crack in the skin. Sometimes, it is not known how the bacteria infected the skin. What increases the risk? You are more likely to develop this condition if you: Are a young child. Are an older adult. Have a weakened disease-fighting system (immune system), such as having HIV or AIDS. Have diabetes. Drink a lot of alcohol. Had recent surgery. Have a yeast infection of the skin. Have swollen legs. What are the signs or symptoms? The infection causes a reddened area on the skin. This reddened area may: Be painful and swollen. Have a clear border around it. Feel itchy and hot. Develop blisters or abscesses. Other symptoms may include: Fever. Chills. Nausea and vomiting. Swollen glands (lymph nodes), such as in the neck. Headache. Feeling tired (fatigue). Loss of appetite. How is this diagnosed? This condition is diagnosed based on: A physical exam. Your health care provider will examine  your skin closely. Your symptoms and medical history. How is this treated? This condition is treated with antibiotic medicine. Symptoms usually get better within a few days after starting antibiotics. Follow these instructions at home: Medicines Take other over-the-counter and prescription medicines only as told by your health care provider. Take your antibiotic medicine as told by your health care provider. Do not stop taking the antibiotic even if your condition starts to improve. Ask your health care provider if it is safe for you to take medicines for pain as needed, such as acetaminophen or ibuprofen. General instructions  If the affected area is on an arm or leg, raise (elevate) the affected arm or leg above the level of your heart while you are sitting or lying down. Do not put any creams or lotions on the affected area of your skin unless your health care provider tells you to do that. Do not share bedding, towels, or washcloths (linens) with other people. Use only your own linens to prevent the infection from spreading to others. Follow instructions from your health care provider about how to take care of your wound. Make sure you: Wash your hands with soap and water for at least 20 seconds before and after you change your bandage (dressing). If soap and water are not available, use hand sanitizer. Change your dressing as told by your health care provider. Keep all follow-up visits. This is important. Contact a health care provider if: Your symptoms do not improve within 1-2 days of starting treatment. You  develop new symptoms. You have a fever. You feel generally sick (malaise) with muscle aches and pains. Get help right away if: Your symptoms get worse. You develop vomiting or diarrhea that does not go away. Your red area gets larger or turns dark in color. You notice red streaks coming from the infected area. Summary Erysipelas is an infection affecting the skin and tissues  near the surface of the skin. It causes the skin to become red, swollen, and painful. This condition is caused by bacteria. Most often, it is caused by bacteria called streptococci. Bacteria may enter through a break in the skin. Sometimes, it is not known how the bacteria infected the skin. This condition is treated with antibiotic medicine. Symptoms usually get better within a few days after starting antibiotics. This information is not intended to replace advice given to you by your health care provider. Make sure you discuss any questions you have with your health care provider. Document Revised: 08/21/2019 Document Reviewed: 08/20/2019 Elsevier Patient Education  [place].

## 2021-11-10 NOTE — Assessment & Plan Note (Signed)
Will give rx for topical triamcinolone ointment and OTC Cetaphil lotion to use 2-3 times a day to left flexural rash.

## 2021-11-10 NOTE — Assessment & Plan Note (Signed)
Christina Mack is here for an earlier follow up on chronic pelvic osteomyelitis after she had a rapid onset of new painful red rash involving her RLE. Exam consistent with erysipelas. I was prepared to refer her for hospitalization and IV antibiotics/imaging with concern she had a breakthrough infection already on antibiotics; however after learning she was not on the linezolid when this started, no signs of systemic illness today and with improvement in condition and receeding of the rash as of this morning, I think we are safe to continue with treating this in the ambulatory setting. Her calf while quite tender is not out of proportionally painful to exam and no tissue crepitus. No fluctuant or bullous areas noted.    Most likely this is due to streptococcal infection which the linezolid cover nicely and also provide some toxin inhibition for her. Will keep her out of work so she can tend to this leg with elevation, cool compresses and rest with return expected on Monday 8/14.   Will have our team call and check in on her tomorrow as well to ensure she continues to note improvement. Precautions discussed that would warrant ER/Hospitalizatoin - pain that becomes more severe, formation of blisters, feeling unwell despite antibiotics, fevers/ rigors.   Encouraged her to let her HBOT team know to make sure that does not preclude her going forward with current treatment plan.

## 2021-11-10 NOTE — Assessment & Plan Note (Addendum)
Completed 8 weeks moxifloxacin and total of 3 weeks of tedezolid>>linezolid for empiric broad spectrum coverage of pelvic OM.  She will finish out 3 more weeks of the linezolid (probiotic seems to help diarrhea to where she can tolerate this). Will see her back after she concludes HBOT and sees Oncology again for timing on repeat PET imaging.  Appt ~end of September 2023.

## 2021-11-11 ENCOUNTER — Ambulatory Visit: Admitting: Infectious Diseases

## 2021-11-14 ENCOUNTER — Telehealth: Payer: Self-pay

## 2021-11-14 NOTE — Telephone Encounter (Signed)
Called patient to follow up on her leg infection and to assess for improvement, no answer.   Left voicemail asking Bianney to return my call or respond via [place].   Beryle Flock, RN

## 2021-12-01 ENCOUNTER — Other Ambulatory Visit: Payer: Self-pay

## 2021-12-01 ENCOUNTER — Telehealth: Payer: Self-pay

## 2021-12-01 MED ORDER — TRIAMCINOLONE ACETONIDE 0.5 % EX OINT: 1.0000 | TOPICAL_OINTMENT | Freq: Two times a day (BID) | CUTANEOUS | 2 refills | Status: DC

## 2021-12-01 NOTE — Telephone Encounter (Signed)
Refill sent to pharmacy. Will request additional refills be sent to PCP or wound care provider. Leatrice Jewels, RMA

## 2021-12-01 NOTE — Telephone Encounter (Signed)
Received [facility] from [facility] requesting refill on Triamcinolone ointment 15 mg 0.5 %.  Will forward message to provider to advise if okay to refill or if wound care should fill rx. Leatrice Jewels, RMA

## 2021-12-12 ENCOUNTER — Encounter (HOSPITAL_BASED_OUTPATIENT_CLINIC_OR_DEPARTMENT_OTHER): Payer: Self-pay | Admitting: Obstetrics and Gynecology

## 2021-12-12 ENCOUNTER — Emergency Department (HOSPITAL_BASED_OUTPATIENT_CLINIC_OR_DEPARTMENT_OTHER)
Admission: EM | Admit: 2021-12-12 | Discharge: 2021-12-12 | Disposition: A | Discharge status: Home / Self Care | Attending: Emergency Medicine | Admitting: Emergency Medicine

## 2021-12-12 ENCOUNTER — Other Ambulatory Visit: Payer: Self-pay

## 2021-12-12 ENCOUNTER — Emergency Department (HOSPITAL_BASED_OUTPATIENT_CLINIC_OR_DEPARTMENT_OTHER): Admitting: Radiology

## 2021-12-12 DIAGNOSIS — Z7901 Long term (current) use of anticoagulants: Secondary | ICD-10-CM | POA: Diagnosis not present

## 2021-12-12 DIAGNOSIS — M8660 Other chronic osteomyelitis, unspecified site: Secondary | ICD-10-CM

## 2021-12-12 DIAGNOSIS — X500XXA Overexertion from strenuous movement or load, initial encounter: Secondary | ICD-10-CM | POA: Insufficient documentation

## 2021-12-12 DIAGNOSIS — S32591A Other specified fracture of right pubis, initial encounter for closed fracture: Secondary | ICD-10-CM

## 2021-12-12 DIAGNOSIS — M8630 Chronic multifocal osteomyelitis, unspecified site: Secondary | ICD-10-CM | POA: Insufficient documentation

## 2021-12-12 DIAGNOSIS — S32511A Fracture of superior rim of right pubis, initial encounter for closed fracture: Secondary | ICD-10-CM | POA: Insufficient documentation

## 2021-12-12 DIAGNOSIS — S79911A Unspecified injury of right hip, initial encounter: Secondary | ICD-10-CM | POA: Diagnosis present

## 2021-12-12 MED ORDER — HYDROCODONE-ACETAMINOPHEN 5-325 MG PO TABS: 1.0000 | ORAL_TABLET | ORAL | 0 refills | Status: DC | PRN

## 2021-12-12 NOTE — Discharge Instructions (Signed)
Please call your infectious disease provider, [name], NP today for follow-up as soon as possible Walk only with your walker Use heat and cold as needed for pain Return if you are having new or worsening symptoms especially fever, worse abdominal pain, redness, or chills

## 2021-12-12 NOTE — ED Provider Notes (Signed)
[facility] EMERGENCY DEPT Provider Note   CSN: [id number] Arrival date & time: 12/12/21  9628     History  Chief Complaint  Patient presents with   Hip Pain    Christina Mack is a 56 y.o. female.  HPI 56 year old female presents complaining of right hip pain, history of glomerulonephritis, status post kidney transplant with chronic allograft dysfunction, MD viremia, EBV viremia, DVT of the pelvis, chronic osteomyelitis of the pelvis, on Eliquis, presents today with right hip pain.  Review of records reveal that she was recently admitted and discharged from [facility] August 25 through 28 due to anemia and increased creatinine.  Patient reports her creatinine continued to be at 6 on her discharge.  She is scheduled outpatient labs and follow-up on Friday.  She presents today because of right hip pain that began on Friday morning.  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  She states the patient was "full assist".  States she normally does not have to lift these patients but attempted to lift her multiple times on her own.  She began having right hip pain Friday morning.  She is states that it is sharp and increases with movement and bearing weight although she is able to bear weight.  No other direct trauma.  States she had a similar episode she was told she had "hairline fractures of the right hip several months ago.  This was identified by her infectious disease doctor.  She states no other treatment was necessary.  She denies any other trauma or bleeding. She denies headache, fever, nasal congestion, sore throat, chest pain, dyspnea, abdominal pain, nausea, vomiting, diarrhea, change in urinary habits, bleeding     Home Medications Prior to Admission medications   Medication Sig Start Date End Date Taking? Authorizing Provider  acetaminophen (TYLENOL) 500 MG tablet Take 2 tablets (1,000 mg total) by mouth 3 (three) times daily. 05/05/20   Gaylan Gerold, DO   apixaban (ELIQUIS) 2.5 MG TABS tablet TAKE 1 TABLET (2.5 MG TOTAL) BY MOUTH DAILY. 05/17/20 05/17/21  Love, Ivan Anchors, PA-C  fluticasone (FLONASE) 50 MCG/ACT nasal spray Place 1 spray into both nostrils daily as needed. 05/16/17   [provider]  linezolid (ZYVOX) 600 MG tablet Take 1 tablet (600 mg total) by mouth 2 (two) times daily. 10/13/21   [place] Callas, NP  metoprolol succinate (TOPROL-XL) 25 MG 24 hr tablet Take 12.5 mg by mouth in the morning and at bedtime.    [provider]  moxifloxacin (AVELOX) 400 MG tablet Take 1 tablet (400 mg total) by mouth daily at 8 pm. 10/13/21   [place] Callas, NP  pantoprazole (PROTONIX) 40 MG tablet TAKE 1 TABLET (40 MG TOTAL) BY MOUTH 2 (TWO) TIMES DAILY BEFORE A MEAL. 05/17/20 05/17/21  Love, Ivan Anchors, PA-C  predniSONE (DELTASONE) 5 MG tablet Take 5 mg by mouth daily with breakfast.    [provider]  PROGRAF 1 MG capsule Take 1 mg by mouth 2 (two) times daily. '2mg'$  in morning '1mg'$  at night 01/31/20   [provider]  sodium bicarbonate 650 MG tablet Take 650 mg by mouth 2 (two) times daily. 02/07/21   [provider]  triamcinolone ointment (KENALOG) 0.5 % Apply 1 Application topically 2 (two) times daily. 12/01/21   [place] Callas, NP      Allergies    Oxycodone-acetaminophen, Zosyn [piperacillin sod-tazobactam so], Hydromorphone, Amlodipine, and Codeine    Review of Systems  Review of Systems  Physical Exam Updated Vital Signs BP (!) 162/96 (BP Location: Right Arm)   Pulse 87   Temp 98.9 F (37.2 C) (Oral)   Resp 18   Ht 1.651 m ('5\' 5"'$ )   Wt 71.4 kg   SpO2 98%   BMI 26.19 kg/m  Physical Exam Vitals and nursing note reviewed.  Constitutional:      General: She is not in acute distress.    Appearance: Normal appearance. She is not ill-appearing.  HENT:     Head: Normocephalic.     Right Ear: External ear normal.     Left Ear: External ear normal.     Nose: Nose normal.      Mouth/Throat:     Pharynx: Oropharynx is clear.  Eyes:     Extraocular Movements: Extraocular movements intact.     Pupils: Pupils are equal, round, and reactive to light.     Comments: Conjunctive are mildly pale  Cardiovascular:     Rate and Rhythm: Normal rate and regular rhythm.     Pulses: Normal pulses.  Pulmonary:     Effort: Pulmonary effort is normal.     Breath sounds: Normal breath sounds.  Abdominal:     General: Bowel sounds are normal.     Palpations: Abdomen is soft.  Musculoskeletal:        General: Tenderness present. No swelling or deformity.     Cervical back: Normal range of motion.     Right lower leg: No edema.     Left lower leg: No edema.     Comments: Tenderness to palpation in right groin and laterally of the right hip  Skin:    General: Skin is warm and dry.     Capillary Refill: Capillary refill takes less than 2 seconds.  Neurological:     General: No focal deficit present.     Mental Status: She is alert.  Psychiatric:        Mood and Affect: Mood normal.     ED Results / Procedures / Treatments   Labs (all labs ordered are listed, but only abnormal results are displayed) Labs Reviewed - No data to display  EKG None  Radiology DG Hip Unilat W or Wo Pelvis 2-3 Views Right  Result Date: 12/12/2021 CLINICAL DATA:  Pain with history of known osteomyelitis. EXAM: DG HIP (WITH OR WITHOUT PELVIS) 2-3V RIGHT COMPARISON:  Previous PET-CT of July 20, 2021 and MRI of the pelvis of April 22, 2021 FINDINGS: Signs of chronic fracture of the RIGHT superior pubic ramus, destruction of the RIGHT inferior pubic ramus with increasing bony destruction compared to more remote imaging from April with further loss of bone in the inferior pubic ramus. Irregularity of the symphysis pubis is grossly unchanged. No sign of RIGHT hip fracture. No additional acute bony process about the pelvis. Known decubitus changes not as well seen as on prior cross-sectional imaging  with defect in the soft tissues adjacent to the medial margin of the inferior pubic ramus likely the site of known w soft tissue ulceration. IMPRESSION: w evidence of chronic fracture of the RIGHT superior pubic ramus with further destruction of the RIGHT inferior pubic ramus in an area of soft tissue ulceration compatible with chronic osteomyelitis. Further destruction of bone suggest continued active and or acute component of osteomyelitis. Electronically Signed   By: Zetta Bills M.D.   On: 12/12/2021 09:10   DG Knee Complete 4 Views Right  Result Date: 12/12/2021 CLINICAL  DATA:  Pain.  Known history of osteomyelitis. EXAM: RIGHT KNEE - COMPLETE 4+ VIEW COMPARISON:  None Available. FINDINGS: There is diffuse decreased bone mineralization. No joint effusion. The knee cortices appear intact. No acute fracture or dislocation. No subcutaneous air. IMPRESSION: 1. No significant osteoarthritis. 2. Intact cortices without radiographic evidence of acute osteomyelitis. Electronically Signed   By: Yvonne Kendall M.D.   On: 12/12/2021 09:06    Procedures Procedures    Medications Ordered in ED Medications - No data to display  ED Course/ Medical Decision Making/ A&P Clinical Course as of 12/12/21 1000  Mon Dec 12, 2021  0931 Per radiologist interpretation of right hip with chronic fracture of the right superior pubic ramus and with further of right inferior pubic ramus  [DR]    Clinical Course User Index [DR] Pattricia Boss, MD                           Medical Decision Making Patient with right hip pain and history of skin myelitis with chronic immunosuppression Patient feels this is most likely musculoskeletal with an acute injury. Discussed other allergies such as worsening infection Plan plain x-rays Discussed getting labs with patient.  She does not wish to have labs obtained at this time as she is a very difficult stick. Plan to obtain plain x-rays and will proceed as appropriate after the  resulting X-rays reviewed and missed fractures noted.  Question of active osteomyelitis.  Patient denies having fever, chills, or increased redness.  Not been on antibiotics for several weeks.  She states this has been in junction with [name], NP who is her infectious disease provider and has been help managing her osteomyelitis. Discussed with Dr. Candiss Norse on for ID Plan patient to follow up with [name], NP asap   Amount and/or Complexity of Data Reviewed Radiology: ordered and independent interpretation performed. Decision-making details documented in ED Course.    Details: Right pelvis x-[place] reviewed and consistent with superior ramus fracture and inferior ramus chronic fracture with ongoing osteomyelitis          Final Clinical Impression(s) / ED Diagnoses Final diagnoses:  Closed fracture of superior ramus of right pubis, initial encounter ([place])  Chronic osteomyelitis ([place])  Closed fracture of right inferior pubic ramus, initial encounter [facility])    Rx / [place] Orders ED Discharge Orders     None         Pattricia Boss, MD 12/12/21 1000

## 2021-12-12 NOTE — ED Triage Notes (Signed)
Patient reports to the ER for right sided hip pain. 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 Patient has a hx of osteomyelitis to that hip

## 2021-12-15 ENCOUNTER — Encounter: Payer: Self-pay | Admitting: Gynecologic Oncology

## 2021-12-16 ENCOUNTER — Ambulatory Visit: Admitting: Gynecologic Oncology

## 2021-12-16 DIAGNOSIS — C519 Malignant neoplasm of vulva, unspecified: Secondary | ICD-10-CM

## 2021-12-16 NOTE — Progress Notes (Unsigned)
Patient called and rescheduled appointment given pelvic fracture

## 2021-12-20 ENCOUNTER — Ambulatory Visit (INDEPENDENT_AMBULATORY_CARE_PROVIDER_SITE_OTHER): Admitting: Infectious Diseases

## 2021-12-20 ENCOUNTER — Encounter: Payer: Self-pay | Admitting: Infectious Diseases

## 2021-12-20 ENCOUNTER — Other Ambulatory Visit: Payer: Self-pay

## 2021-12-20 VITALS — BP 150/92 | HR 88 | Temp 98.1°F

## 2021-12-20 DIAGNOSIS — M86151 Other acute osteomyelitis, right femur: Secondary | ICD-10-CM

## 2021-12-20 DIAGNOSIS — D071 Carcinoma in situ of vulva: Secondary | ICD-10-CM

## 2021-12-20 DIAGNOSIS — Z792 Long term (current) use of antibiotics: Secondary | ICD-10-CM

## 2021-12-20 DIAGNOSIS — S31109S Unspecified open wound of abdominal wall, unspecified quadrant without penetration into peritoneal cavity, sequela: Secondary | ICD-10-CM

## 2021-12-20 NOTE — Progress Notes (Addendum)
Discussed with Dr. Sharol Given via chart  given what appears to be medical failure of for treating OM vs cumulative effect of multifactorial contributors (radiation damage, high risk for osteoporosis d/t txplt meds and bone infection). Out of his expertise given pubic rami involvement and [facility]. Given she is already working with [facility] will look to see if we can coordinate urgent referral to [facility] for surgical recommendations.   Will also reach out to GYN Onc team as well to see any plans for wound closure. She will certainly continue to re-infect the area given the large open contiguous wound.  Given challenges around timely referrals, I am going to go ahead and refer to [facility] team to help with collaborative surgical care.    Christina Madeira, Christina Mack, Christina Mack Christina Mack Pager: [phone] Office: [phone] [facility] Main Line: [place] [facility] Welcome

## 2021-12-20 NOTE — Assessment & Plan Note (Signed)
Last creatinine ~5. Current regimen does not need kidney dose adjustments fortunately as she recovers. Encouraged to stay hydrated with PRN anti-diarrheal and probiotic if that is helpful for diarrhea control.

## 2021-12-20 NOTE — Progress Notes (Signed)
Patient: Christina Mack  DOB: May 01, 1965 MRN: [id number] PCP: System, Provider Not In    Subjective:   Chief Complaint  Patient presents with   Follow-up     HPI:  Since our last OV in August, she has required hospitalization for acute on chronic renal failure --> 8/25 - 8/29 [facility] Stay Reviewed: SCr 6.4, Hgb 6.7. She did have diarrhea with prolonged antibiotics but otherwise no reported N/V or urinary symptoms or systemic illness. She received 2 UPRBCs, kidney biopsy revealed chronic rejection on Prograf '3mg'$ /'3mg'$  and Prednisone '5mg'$  QD (cellcept has been held with infectious problems). HBOT continued her treatments while inpatient during this time.   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 She still has significant pain in this area and coming down the back of the leg from lower right back near SI joint.   HBOT #46/60 performed yesterday. Using a walker at home and out of work until October for now pending review. Looking into workers comp case.   Has spoken with [facility]plant coordinator to reschedule her follow up with them - last Creatinine ~5.  Has a few more weeks of Avelox and Linezolid left over from when we decided to stop recent long course and she has resumed this. Started the linezolid back 1x a day d/t diarrhea s/e.    Impression of XRay:  IMPRESSION: w evidence of chronic fracture of the RIGHT superior pubic ramus with further destruction of the RIGHT inferior pubic ramus in an area of soft tissue ulceration compatible with chronic osteomyelitis. Further destruction of bone suggest continued active and or acute component of osteomyelitis.   Review of Systems  Constitutional:  Negative for chills, fever and weight loss.  Cardiovascular:  Negative for leg swelling.  Gastrointestinal:  Negative for abdominal pain, diarrhea, nausea and  vomiting.  Musculoskeletal:  Positive for back pain and joint pain.  Skin:  Negative for rash.      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 30 tablet 0   carvedilol (COREG) 25 MG tablet SMARTSIG:1 Tablet(s) By Mouth Every 12 Hours     fluticasone (FLONASE) 50 MCG/ACT nasal spray Place 1 spray into both nostrils daily as needed.     HYDROcodone-acetaminophen (NORCO/VICODIN) 5-325 MG tablet Take 1 tablet by mouth every 4 (four) hours as needed. 10 tablet 0   predniSONE (DELTASONE) 5 MG tablet Take 5 mg by mouth daily with breakfast.     sodium bicarbonate 650 MG tablet Take 650 mg by mouth 2 (two) times daily.     tacrolimus (PROGRAF) 1 MG capsule Take 1 mg by mouth. Per pt '3mg'$   twice daily     triamcinolone ointment (KENALOG) 0.5 % Apply 1 Application topically 2 (two) times daily. 30 g 2   apixaban (ELIQUIS) 2.5 MG TABS tablet TAKE 1 TABLET (2.5 MG TOTAL) BY MOUTH DAILY. 60 tablet 0   pantoprazole (PROTONIX) 40 MG tablet TAKE 1 TABLET (40 MG TOTAL)  BY MOUTH 2 (TWO) TIMES DAILY BEFORE A MEAL. 60 tablet 0   No facility-administered medications prior to visit.     Allergies  Allergen Reactions   Oxycodone-Acetaminophen Itching, Nausea Only, Other (See Comments), Nausea And Vomiting and Rash    Other Reaction: HA Other Reaction: HA    Zosyn [Piperacillin Sod-Tazobactam So] Other (See Comments)    Thrombocytopenia   Hydromorphone Nausea And Vomiting, Rash and Other (See Comments)   Amlodipine Other (See Comments)    Ankle swelling   Codeine     vomiting    Social History   Tobacco Use   Smoking status: Never   Smokeless tobacco:  Never  Vaping Use   Vaping Use: Never used  Substance Use Topics   Alcohol use: Not Currently   Drug use: Never     Objective:   Vitals:   12/20/21 1524  BP: (!) 150/92  Pulse: 88  Temp: 98.1 F (36.7 C)  TempSrc: Oral  SpO2: 99%   There is no height or weight on file to calculate BMI.  Physical Exam Vitals reviewed.  Constitutional:      Appearance: Normal appearance. She is not ill-appearing.  HENT:     Mouth/Throat:     Mouth: Mucous membranes are moist.     Pharynx: Oropharynx is clear.  Eyes:     General: No scleral icterus. Cardiovascular:     Rate and Rhythm: Normal rate.  Pulmonary:     Effort: Pulmonary effort is normal.  Skin:    Findings: Erythema and rash present.     Comments: Non-purulent violaceous, well-demarcated rash noted to posterior calf. She has tenderness in dependent positions. She can More diffuse erythema noted anterior lower leg. Anterior thigh with receeding similar appearing rash but it is much more diffuse than posterior calf.   Neurological:     Mental Status: She is oriented to person, place, and time.  Psychiatric:        Mood and Affect: Mood normal.        Thought Content: Thought content normal.     Lab Results: Lab Results  Component Value Date   WBC 6.7 06/08/2020   HGB 7.8 (L) 06/08/2020   HCT 23.8 (L) 06/08/2020   MCV 90.2 06/08/2020   PLT 177 06/08/2020    Lab Results  Component Value Date   CREATININE 2.93 (H) 06/08/2020   BUN 28 (H) 06/08/2020   NA 133 (L) 06/08/2020   K 5.5 (H) 06/08/2020   CL 99 06/08/2020   CO2 26 06/08/2020    Lab Results  Component Value Date   ALT 12 05/06/2020   AST 17 05/06/2020   ALKPHOS 213 (H) 05/06/2020   BILITOT 0.8 05/06/2020     Assessment & Plan:   Problem List Items Addressed This Visit       Unprioritized   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 ([place])    ER xrays reviewed suggesting ongoing worsening / destruction of pubic ramus. She continues to work  with HBOT diligently and now nearly 50 treatments in and have completed 2 courses of broad empiric antibiotics (Linezolid and Moxifloxacin, previously levaquin + metronidazole + tedezolid until on back order). She has historically refused PICC line but really the mode of antibiotic deliver I don't think would have made much of a difference. I have placed a call to HBOT that saw her yesterday to discuss and will reach out to Dr. Sharol Given as well who has seen her before for possible  surgical management options or consideration for bone biopsy. Could send one specimen for routine cultures and second specimen for 16s RNA sequencing -- the latter would not yield any antibiotic susceptibilities but may ID an organism. Given her antibiotic intolerances, stage 4 kidney disease would be nice to target to a particular organism if we can.  She is not thrilled about this option but open to meeting with him.    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 We reviewed xrays together and she has very little structural support at superior pubic ramus that to me appears progressive compared to imaging last 5 months ago now.   FU with me tbd after conferring with other specialists.       Long term (current) use of antibiotics    Last creatinine ~5. Current regimen does not need kidney dose adjustments fortunately as she recovers. Encouraged to stay hydrated with PRN anti-diarrheal and probiotic if that is helpful for diarrhea control.       Total encounter time 35 min in direct discussion record review from hospital stay, other providers and conversations with collaborating teams.   [name], MSN, NP-C [facility] for Infectious [place] Pager: [phone] Office: [phone]  12/20/21  4:18 PM

## 2021-12-20 NOTE — Addendum Note (Signed)
Addended by: [place] Callas on: 12/20/2021 05:26 PM   Modules accepted: Orders

## 2021-12-20 NOTE — Patient Instructions (Addendum)
Please call Dr. Sharol Given to schedule a follow up with him - I think we need help with possible biopsy of the bone to see if we can help with further treatment decisions. I am worried about the changes we see on the xray despite our efforts with antibiotics to treat this infection.   Can continue the linezolid (twice a day) and moxifloxacin (once a day) for now - neither of these are renally cleared so OK to continue even with your recent worsening of kidney status.

## 2021-12-20 NOTE — Assessment & Plan Note (Addendum)
ER xrays reviewed suggesting ongoing worsening / destruction of pubic ramus. She continues to work with HBOT diligently and now nearly 50 treatments in and have completed 2 courses of broad empiric antibiotics (Linezolid and Moxifloxacin, previously levaquin + metronidazole + tedezolid until on back order). She has historically refused PICC line but really the mode of antibiotic deliver I don't think would have made much of a difference. I have placed a call to HBOT that saw her yesterday to discuss and will reach out to Dr. Sharol Given as well who has seen her before for possible surgical management options or consideration for bone biopsy. Could send one specimen for routine cultures and second specimen for 16s RNA sequencing -- the latter would not yield any antibiotic susceptibilities but may ID an organism. Given her antibiotic intolerances, stage 4 kidney disease would be nice to target to a particular organism if we can.  She is not thrilled about this option but open to meeting with him.    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 We reviewed xrays together and she has very little structural support at superior pubic ramus that to me appears progressive compared to imaging last 5 months ago now.   FU with me tbd after conferring with other specialists.

## 2021-12-21 NOTE — Addendum Note (Signed)
Addended by: [place] Callas on: 12/21/2021 01:36 PM   Modules accepted: Orders

## 2021-12-30 ENCOUNTER — Telehealth: Payer: Self-pay | Admitting: Plastic Surgery

## 2021-12-30 NOTE — Telephone Encounter (Signed)
Pt is calling in about her referral she is not wanting to move forward per her PCP wants her to see the Ortho group first and then see [place].  So pt stated that after she see the Ortho group she will give [place] a call to make that appt if she still needs one.

## 2022-01-09 ENCOUNTER — Encounter (INDEPENDENT_AMBULATORY_CARE_PROVIDER_SITE_OTHER)

## 2022-01-09 DIAGNOSIS — Z0279 Encounter for issue of other medical certificate: Secondary | ICD-10-CM | POA: Diagnosis not present

## 2022-01-10 ENCOUNTER — Encounter: Payer: Self-pay | Admitting: Infectious Diseases

## 2022-01-10 NOTE — Telephone Encounter (Signed)
Please see the MyChart message reply(ies) for my assessment and plan.    This patient gave consent for this Medical Advice Message and is aware that it may result in a bill to [employer], as well as the possibility of receiving a bill for a co-payment or deductible. They are an established patient, but are not seeking medical advice exclusively about a problem treated during an in person or video visit in the last seven days. I did not recommend an in person or video visit within seven days of my reply.    I spent a total of 12 minutes cumulative time within 7 days through [employer].  [name], NP

## 2022-01-17 ENCOUNTER — Encounter: Payer: Self-pay | Admitting: Gynecologic Oncology

## 2022-01-17 ENCOUNTER — Other Ambulatory Visit: Payer: Self-pay

## 2022-01-17 ENCOUNTER — Inpatient Hospital Stay: Attending: Gynecologic Oncology | Admitting: Gynecologic Oncology

## 2022-01-17 VITALS — BP 149/87 | HR 71 | Temp 97.9°F | Resp 17 | Ht 65.0 in | Wt 152.1 lb

## 2022-01-17 DIAGNOSIS — Z9221 Personal history of antineoplastic chemotherapy: Secondary | ICD-10-CM | POA: Insufficient documentation

## 2022-01-17 DIAGNOSIS — S31103S Unspecified open wound of abdominal wall, right lower quadrant without penetration into peritoneal cavity, sequela: Secondary | ICD-10-CM | POA: Diagnosis not present

## 2022-01-17 DIAGNOSIS — Z923 Personal history of irradiation: Secondary | ICD-10-CM | POA: Insufficient documentation

## 2022-01-17 DIAGNOSIS — Z8544 Personal history of malignant neoplasm of other female genital organs: Secondary | ICD-10-CM | POA: Insufficient documentation

## 2022-01-17 DIAGNOSIS — C519 Malignant neoplasm of vulva, unspecified: Secondary | ICD-10-CM

## 2022-01-17 DIAGNOSIS — Z8739 Personal history of other diseases of the musculoskeletal system and connective tissue: Secondary | ICD-10-CM

## 2022-01-17 DIAGNOSIS — I890 Lymphedema, not elsewhere classified: Secondary | ICD-10-CM | POA: Diagnosis not present

## 2022-01-17 DIAGNOSIS — Z9079 Acquired absence of other genital organ(s): Secondary | ICD-10-CM | POA: Insufficient documentation

## 2022-01-17 DIAGNOSIS — Z940 Kidney transplant status: Secondary | ICD-10-CM | POA: Insufficient documentation

## 2022-01-17 DIAGNOSIS — S31109S Unspecified open wound of abdominal wall, unspecified quadrant without penetration into peritoneal cavity, sequela: Secondary | ICD-10-CM

## 2022-01-17 NOTE — Progress Notes (Signed)
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 Juleen China)  Initial exam by Dr. Juleen China on 09/08/19: right vulva was completely involved by a large necrotic mass extending from the inguinal crease towards the anus measuring 12x5cm. It involved the distal vagina and extended to involve the left labia as well. It was difficult to evaluate her vaginal involvement due to pain.   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([place])  09/10/2019 Initial Biopsy   Vulvar biopsy: at least VIN3, biopsy demonstrates extreme ulceration, inflammation and granulation tissue. Underlying invasion very difficult to rule out.   09/16/2019 Imaging   PET: IMPRESSION:  1.  Large hypermetabolic mass in the right side of the perineum, consistent with known vulvar malignancy.  2.  Hypermetabolic right groin lymphadenopathy is consistent with nodal involvement.  3.  Tiny right apical pulmonary nodule is below PET resolution.    09/16/2019 Initial Diagnosis   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([place])   11/03/2019 - 01/15/2020 Radiation Therapy   IMRT. She received 45GY/25 fractions to the pelvic and inguinal nodes and vulvar mass. The vulvar mass and enlarged inguinal nodes received a boost dose of 1800cGY/10 fractions for total dose of 63Gy/35 fractions.  Treated in [place]. Diagnosed with DVT several weeks into treatment. Missed treatments due to feeling poorly. Received 4 Cisplatin (stopped due to elevated Cr) - 8/5, 8/11, 8/18, 8/25   04/03/2020 Imaging   CT A/P w/o contrast: 1.  Redemonstration of the large right perineal mass, with new extensive surrounding soft tissue stranding and gas which  extends along the bilateral anterior pubic bones and anterior pelvis.  I'm unable to exclude developing necrotizing fasciitis.  No definite drainable abscess collection is identified.  2.  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  3.  Diffuse edema of the pelvic mesentery with mild presacral fluid, concerning for mesenteritis.  No definite drainable intraperitoneal fluid collection identified.  4.  Suspected bowel ileus or gastroenteritis.  5.  Moderately distended gallbladder with heterogeneous dependent material suggestive of sludge and possible gallstones.  6.  Hepatosplenomegaly.  7.  Suspected anemia.  8.  Moderate diffuse urinary bladder wall thickening suggesting chronic cystitis.    04/20/2020 - 05/05/2020 Hospital Admission   Admitted in the setting of right groin wound. Had been admitted to [facility] 1/2 with concerns for soft tissue infection. Started on vanc/zosyn --> oral levo/flagyl to complete 3 weeks of treatment ( EOT 1/22). Pelvic MRI at [facility] showed multilocular abscess of left pelvis, myositis, nondisplaced fracture of inferior right pubic ramus, concern for osteomytlitis. IR drainage of abscess was performed - VRE on culture. ID recommended IV dapto --> tedezolid oupatient (as well as levo + flagyl) for 6 weeks total.    04/26/2020 Imaging   MRI pelvis w/o contrast: IMPRESSION: 1. Large area of ulceration with sinus tract seen within the right labial fold with diffuse surrounding phlegmon and extensive myositis in the bilateral adductor and anterior muscular compartments of both thighs. 2. There is a multilocular fluid collection extending in the inferior left pelvic soft tissues surrounding the pubic rami and extending to the anterior upper left thigh, consistent with a multilocular abscess. 3. Nondisplaced fracture of the inferior right  pubic rami with findings suggestive osteomyelitis involving the pubic rami and anterior pubic symphysis. 4.  Findings suggestive of osteomyelitis involving the anterior left pubic symphysis.   05/05/2020 - 05/17/2020 Hospital Admission   Admitted for inpatient rehab given debility.   07/27/2020 Imaging   PET: Significant decrease in size and avidity of vulvar lesion. Air and soft tissue inflammation. Mild PET hypermetabolic activity. Resolution of inguinal LN activity. 31m LU love groundglass nodule, mild hypermetabolic activity.   09/14/2020 Imaging   CT Chest w/o contrast: IMPRESSION:  1.  Resolution of left upper lobe nodule and associated groundglass opacity.  2.  Stable 5 mm nodular opacity right upper lobe.  3.  Stable 3 mm nodule right lower lobe.    07/20/2021 Imaging   PET: No findings suspicious for recurrent vulvar carcinoma or metastatic disease. 2. Persistent open wound along the right perineum extending down to the bone. There are progressive changes of probable osteomyelitis involving the right inferior pubic ramus with pathologic fractures. 3. Interval improved appearance of the pubic symphysis and adjacent pubic bones.    In August, was hospitalized in acute on chronic renal failure with a creatinine of over 6.  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  Interval History: Doing well.  Has completed hyperbaric oxygen therapy.  Has noted significant decrease in discharge from her open wound.  She also feels that the wound is much more shallow and narrow.  She denies any bleeding or significant pain.  She has been referred to an oncologic orthopedic doctor although is [facility]  There is also been a referral placed for plastics.  Recent imaging was again concerning for osteomyelitis and there has been some discussion regarding surgical intervention.  Patient is currently not on antibiotics.  Past Medical/Surgical History: Past Medical History:  Diagnosis Date   Complication of anesthesia    nausea and  vomiting   DVT (deep venous thrombosis) (HCC)    History of osteomyelitis    Kidney transplant recipient    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([facility]     Past Surgical History:  Procedure Laterality Date   BIOPSY  04/24/2020   Procedure: BIOPSY;  Surgeon: DDoran Stabler MD;  [facility]  Service: Gastroenterology;;   ESOPHAGOGASTRODUODENOSCOPY (EGD) WITH PROPOFOL N/A 04/24/2020   Procedure: ESOPHAGOGASTRODUODENOSCOPY (EGD) WITH PROPOFOL;  Surgeon: DDoran Stabler MD;  [facility]  Service: Gastroenterology;  Laterality: N/A;   IR [place] BX ASP/DRAIN  04/30/2020   KIDNEY TRANSPLANT  2001   KIDNEY TRANSPLANT  2014   LEEP     TOTAL ABDOMINAL HYSTERECTOMY     ovaries left in situ    Family History  Problem Relation Age of Onset   Cerebral aneurysm Mother    AAA (abdominal aortic aneurysm) Brother    Breast cancer Paternal Aunt    Heart attack Maternal Grandmother    Brain cancer Maternal Grandfather    Colon cancer Neg Hx    Ovarian cancer Neg Hx    Endometrial cancer Neg Hx    Pancreatic cancer Neg Hx    Prostate cancer Neg Hx     Social History   Socioeconomic History   Marital status: Married    Spouse name: Not on file   Number of children: Not on file   Years of education: Not on file   Highest education level: Not on file  Occupational History   Not on file  Tobacco Use   Smoking status: Never   Smokeless tobacco: Never  Vaping Use   Vaping Use: Never used  Substance and Sexual Activity   Alcohol use: Not Currently   Drug use: Never   Sexual activity: Not Currently  Other Topics Concern   Not on file  Social History Narrative   Not on file   Social Determinants of Health   Financial Resource Strain: Not on file  Food Insecurity: Not on file  Transportation Needs: Not on file  Physical Activity: Not on file  Stress: Not on file  Social Connections: Not on file    Current Medications:  Current Outpatient Medications:     acetaminophen (TYLENOL) 500 MG tablet, Take 2 tablets (1,000 mg total) by mouth 3 (three) times daily., Disp: 30 tablet, Rfl: 0   carvedilol (COREG) 25 MG tablet, SMARTSIG:1 Tablet(s) By Mouth Every 12 Hours, Disp: , Rfl:    fluticasone (FLONASE) 50 MCG/ACT nasal spray, Place 1 spray into both nostrils daily as needed., Disp: , Rfl:    predniSONE (DELTASONE) 5 MG tablet, Take 5 mg by mouth daily with breakfast., Disp: , Rfl:    sodium bicarbonate 650 MG tablet, Take 650 mg by mouth 2 (two) times daily., Disp: , Rfl:    tacrolimus (PROGRAF) 1 MG capsule, Take 1 mg by mouth. Per pt '3mg'$   twice daily, Disp: , Rfl:    triamcinolone ointment (KENALOG) 0.5 %, Apply 1 Application topically 2 (two) times daily., Disp: 30 g, Rfl: 2   apixaban (ELIQUIS) 2.5 MG TABS tablet, TAKE 1 TABLET (2.5 MG TOTAL) BY MOUTH DAILY., Disp: 60 tablet, Rfl: 0   pantoprazole (PROTONIX) 40 MG tablet, TAKE 1 TABLET (40 MG TOTAL) BY MOUTH 2 (TWO) TIMES DAILY BEFORE A MEAL., Disp: 60 tablet, Rfl: 0  Review of Systems: + vision problems, right leg swelling Denies appetite changes, fevers, chills, fatigue, unexplained weight changes. Denies hearing loss, neck lumps or masses, mouth sores, ringing in ears or voice changes. Denies cough or wheezing.  Denies shortness of breath. Denies chest pain or palpitations.  Denies abdominal distention, pain, blood in stools, constipation, diarrhea, nausea, vomiting, or early satiety. Denies pain with intercourse, dysuria, frequency, hematuria or incontinence. Denies hot flashes, pelvic pain, vaginal bleeding or vaginal discharge.   Denies joint pain, back pain or muscle pain/cramps. Denies itching, rash, or wounds. Denies dizziness, headaches, numbness or seizures. Denies swollen lymph nodes or glands, denies easy bruising or bleeding. Denies anxiety, depression, confusion, or decreased concentration.  Physical Exam: BP (!) 149/87 (BP Location: Left Arm, Patient Position: Sitting)    Pulse 71   Temp 97.9 F (36.6 C) (Oral)   Resp 17   Ht '5\' 5"'$  (1.651 m)   Wt 152 lb 2 oz (69 kg)   SpO2 100%   BMI 25.31 kg/m  General: Alert, oriented, no acute distress. HEENT: Normocephalic, atraumatic, sclera anicteric. Chest: Clear to auscultation bilaterally.  No wheezes or rhonchi. Cardiovascular: Regular rate and rhythm, no murmurs. Abdomen: soft, nontender.  Normoactive bowel sounds.  No masses or hepatosplenomegaly appreciated.   Extremities: Grossly normal range of motion.  Warm, well perfused.  1-2+ edema of right leg. Skin: No rashes or lesions noted. Lymphatics: No cervical, supraclavicular adenopathy.  No left inguinal adenopathy GU:  1-2+ edema of mons. External female genitalia with similar appearance from last visit although the size of her open wound has significantly decreased.  On palpation, there is a small area where bone can be palpated at the  base of this open wound.  She continues to have an open wound along the right groin with complete lack of labia majora and minora on that side.  There is continued edema of the left labia although less.  Urethra is normal in appearance.  There is a small amount of fibrinous exudate over bone within her open wound, no purulence or bleeding.   Radiation changes of the vulva noted.  Laboratory & Radiologic Studies: None new  Assessment & Plan: Christina Mack is a 56 y.o. woman with history of stage IIIC vulvar cancer presenting for surveillance visit today. Completed treatment 01/2020. Recently finished hyperbaric oxygen therapy for chronic right inguinal wound.  From a cancer standpoint, the patient is doing well without evidence of disease on exam.  Her last PET scan was in April.  I have recommended imaging given possible plans for surgical intervention for her suspected chronic osteomyelitis.  PET scan was ordered and will be scheduled.  Given pubic rami involvement, recommendation was made for referral to orthopedic oncology  as well is plastic surgery at [facility].  Patient is scheduled to see one of the trauma orthopedic surgeons next week.  I have asked her to keep me posted with any plans for surgery.  I will plan to see the patient back in January for follow-up.    28 minutes of total time was spent for this patient encounter, including preparation, face-to-face counseling with the patient and coordination of care, and documentation of the encounter.  Jeral Pinch, MD  Division of Gynecologic Oncology  Department of Obstetrics and Gynecology  [facility]

## 2022-01-17 NOTE — Patient Instructions (Signed)
It was good to see you today. I will plan to see you in mid to late-January. Please call back in December to schedule a visit with me.  Please keep me posted about your visits with orthopedics and plastic surgery at [facility].  I will let you know once I have the PET scan results.

## 2022-01-19 ENCOUNTER — Other Ambulatory Visit: Payer: Self-pay

## 2022-01-19 ENCOUNTER — Inpatient Hospital Stay (HOSPITAL_COMMUNITY)

## 2022-01-19 ENCOUNTER — Inpatient Hospital Stay (HOSPITAL_COMMUNITY)
Admission: EM | Admit: 2022-01-19 | Discharge: 2022-01-25 | DRG: 469 | Disposition: A | Discharge status: Home Health | Attending: Internal Medicine | Admitting: Internal Medicine

## 2022-01-19 ENCOUNTER — Encounter (HOSPITAL_COMMUNITY): Payer: Self-pay

## 2022-01-19 ENCOUNTER — Emergency Department (HOSPITAL_COMMUNITY)

## 2022-01-19 DIAGNOSIS — D649 Anemia, unspecified: Secondary | ICD-10-CM | POA: Diagnosis not present

## 2022-01-19 DIAGNOSIS — S72041A Displaced fracture of base of neck of right femur, initial encounter for closed fracture: Secondary | ICD-10-CM | POA: Diagnosis not present

## 2022-01-19 DIAGNOSIS — M9420 Chondromalacia, unspecified site: Secondary | ICD-10-CM | POA: Diagnosis not present

## 2022-01-19 DIAGNOSIS — S82144A Nondisplaced bicondylar fracture of right tibia, initial encounter for closed fracture: Principal | ICD-10-CM | POA: Diagnosis present

## 2022-01-19 DIAGNOSIS — Y830 Surgical operation with transplant of whole organ as the cause of abnormal reaction of the patient, or of later complication, without mention of misadventure at the time of the procedure: Secondary | ICD-10-CM | POA: Diagnosis present

## 2022-01-19 DIAGNOSIS — Z90710 Acquired absence of both cervix and uterus: Secondary | ICD-10-CM

## 2022-01-19 DIAGNOSIS — T8619 Other complication of kidney transplant: Secondary | ICD-10-CM | POA: Diagnosis present

## 2022-01-19 DIAGNOSIS — L98499 Non-pressure chronic ulcer of skin of other sites with unspecified severity: Secondary | ICD-10-CM | POA: Diagnosis present

## 2022-01-19 DIAGNOSIS — N186 End stage renal disease: Secondary | ICD-10-CM | POA: Diagnosis present

## 2022-01-19 DIAGNOSIS — Z86718 Personal history of other venous thrombosis and embolism: Secondary | ICD-10-CM | POA: Diagnosis not present

## 2022-01-19 DIAGNOSIS — I10 Essential (primary) hypertension: Secondary | ICD-10-CM | POA: Diagnosis not present

## 2022-01-19 DIAGNOSIS — Z8249 Family history of ischemic heart disease and other diseases of the circulatory system: Secondary | ICD-10-CM | POA: Diagnosis not present

## 2022-01-19 DIAGNOSIS — S82141A Displaced bicondylar fracture of right tibia, initial encounter for closed fracture: Secondary | ICD-10-CM

## 2022-01-19 DIAGNOSIS — Z8544 Personal history of malignant neoplasm of other female genital organs: Secondary | ICD-10-CM

## 2022-01-19 DIAGNOSIS — M84454K Pathological fracture, pelvis, subsequent encounter for fracture with nonunion: Secondary | ICD-10-CM | POA: Diagnosis present

## 2022-01-19 DIAGNOSIS — Z79899 Other long term (current) drug therapy: Secondary | ICD-10-CM

## 2022-01-19 DIAGNOSIS — D62 Acute posthemorrhagic anemia: Secondary | ICD-10-CM | POA: Diagnosis present

## 2022-01-19 DIAGNOSIS — D61818 Other pancytopenia: Secondary | ICD-10-CM | POA: Diagnosis present

## 2022-01-19 DIAGNOSIS — Z803 Family history of malignant neoplasm of breast: Secondary | ICD-10-CM | POA: Diagnosis not present

## 2022-01-19 DIAGNOSIS — S72011A Unspecified intracapsular fracture of right femur, initial encounter for closed fracture: Secondary | ICD-10-CM | POA: Diagnosis present

## 2022-01-19 DIAGNOSIS — T8611 Kidney transplant rejection: Secondary | ICD-10-CM | POA: Diagnosis present

## 2022-01-19 DIAGNOSIS — M899 Disorder of bone, unspecified: Secondary | ICD-10-CM | POA: Diagnosis present

## 2022-01-19 DIAGNOSIS — W19XXXA Unspecified fall, initial encounter: Secondary | ICD-10-CM

## 2022-01-19 DIAGNOSIS — M8668 Other chronic osteomyelitis, other site: Secondary | ICD-10-CM | POA: Diagnosis present

## 2022-01-19 DIAGNOSIS — Y93K1 Activity, walking an animal: Secondary | ICD-10-CM

## 2022-01-19 DIAGNOSIS — S82124A Nondisplaced fracture of lateral condyle of right tibia, initial encounter for closed fracture: Secondary | ICD-10-CM | POA: Diagnosis not present

## 2022-01-19 DIAGNOSIS — D631 Anemia in chronic kidney disease: Secondary | ICD-10-CM | POA: Diagnosis present

## 2022-01-19 DIAGNOSIS — E785 Hyperlipidemia, unspecified: Secondary | ICD-10-CM | POA: Diagnosis present

## 2022-01-19 DIAGNOSIS — Z808 Family history of malignant neoplasm of other organs or systems: Secondary | ICD-10-CM | POA: Diagnosis not present

## 2022-01-19 DIAGNOSIS — Z888 Allergy status to other drugs, medicaments and biological substances status: Secondary | ICD-10-CM

## 2022-01-19 DIAGNOSIS — Z9221 Personal history of antineoplastic chemotherapy: Secondary | ICD-10-CM

## 2022-01-19 DIAGNOSIS — I120 Hypertensive chronic kidney disease with stage 5 chronic kidney disease or end stage renal disease: Secondary | ICD-10-CM | POA: Diagnosis present

## 2022-01-19 DIAGNOSIS — Z7952 Long term (current) use of systemic steroids: Secondary | ICD-10-CM

## 2022-01-19 DIAGNOSIS — S72001A Fracture of unspecified part of neck of right femur, initial encounter for closed fracture: Principal | ICD-10-CM

## 2022-01-19 DIAGNOSIS — Y842 Radiological procedure and radiotherapy as the cause of abnormal reaction of the patient, or of later complication, without mention of misadventure at the time of the procedure: Secondary | ICD-10-CM | POA: Diagnosis present

## 2022-01-19 DIAGNOSIS — N179 Acute kidney failure, unspecified: Secondary | ICD-10-CM | POA: Diagnosis present

## 2022-01-19 DIAGNOSIS — Z7901 Long term (current) use of anticoagulants: Secondary | ICD-10-CM

## 2022-01-19 DIAGNOSIS — W109XXA Fall (on) (from) unspecified stairs and steps, initial encounter: Secondary | ICD-10-CM | POA: Diagnosis present

## 2022-01-19 LAB — BASIC METABOLIC PANEL
Anion gap: 9 (ref 5–15)
BUN: 82 mg/dL — ABNORMAL HIGH (ref 6–20)
CO2: 21 mmol/L — ABNORMAL LOW (ref 22–32)
Calcium: 9 mg/dL (ref 8.9–10.3)
Chloride: 112 mmol/L — ABNORMAL HIGH (ref 98–111)
Creatinine, Ser: 6.79 mg/dL — ABNORMAL HIGH (ref 0.44–1.00)
GFR, Estimated: 7 mL/min — ABNORMAL LOW (ref >60)
Glucose, Bld: 137 mg/dL — ABNORMAL HIGH (ref 70–99)
Potassium: 4 mmol/L (ref 3.5–5.1)
Sodium: 142 mmol/L (ref 135–145)

## 2022-01-19 LAB — CBC WITH DIFFERENTIAL/PLATELET
Abs Immature Granulocytes: 0.09 10*3/uL — ABNORMAL HIGH (ref 0.00–0.07)
Basophils Absolute: 0 10*3/uL (ref 0.0–0.1)
Basophils Relative: 0 %
Eosinophils Absolute: 0.1 10*3/uL (ref 0.0–0.5)
Eosinophils Relative: 2 %
HCT: 19.8 % — ABNORMAL LOW (ref 36.0–46.0)
Hemoglobin: 6.4 g/dL — CL (ref 12.0–15.0)
Immature Granulocytes: 3 %
Lymphocytes Relative: 56 %
Lymphs Abs: 1.6 10*3/uL (ref 0.7–4.0)
MCH: 31.2 pg (ref 26.0–34.0)
MCHC: 32.3 g/dL (ref 30.0–36.0)
MCV: 96.6 fL (ref 80.0–100.0)
Monocytes Absolute: 0.3 10*3/uL (ref 0.1–1.0)
Monocytes Relative: 9 %
Neutro Abs: 0.9 10*3/uL — ABNORMAL LOW (ref 1.7–7.7)
Neutrophils Relative %: 30 %
Platelets: 111 10*3/uL — ABNORMAL LOW (ref 150–400)
RBC: 2.05 MIL/uL — ABNORMAL LOW (ref 3.87–5.11)
RDW: 15.9 % — ABNORMAL HIGH (ref 11.5–15.5)
WBC: 2.9 10*3/uL — ABNORMAL LOW (ref 4.0–10.5)
nRBC: 0.7 % — ABNORMAL HIGH (ref 0.0–0.2)

## 2022-01-19 LAB — PROTIME-INR
INR: 1.1 (ref 0.8–1.2)
Prothrombin Time: 14.5 seconds (ref 11.4–15.2)

## 2022-01-19 LAB — PREPARE RBC (CROSSMATCH)

## 2022-01-19 MED ORDER — ONDANSETRON HCL 4 MG/2ML IJ SOLN
4.0000 mg | Freq: Four times a day (QID) | INTRAMUSCULAR | Status: DC | PRN
  Administered 2022-01-22: 4 mg via INTRAVENOUS
  Filled 2022-01-19: qty 2

## 2022-01-19 MED ORDER — SODIUM BICARBONATE 650 MG PO TABS
1300.0000 mg | ORAL_TABLET | Freq: Three times a day (TID) | ORAL | Status: DC
  Administered 2022-01-19 – 2022-01-25 (×18): 1300 mg via ORAL
  Filled 2022-01-19 (×18): qty 2

## 2022-01-19 MED ORDER — CARVEDILOL 25 MG PO TABS
25.0000 mg | ORAL_TABLET | Freq: Two times a day (BID) | ORAL | Status: DC
  Administered 2022-01-19 – 2022-01-25 (×11): 25 mg via ORAL
  Filled 2022-01-19: qty 1
  Filled 2022-01-19 (×2): qty 2
  Filled 2022-01-19 (×8): qty 1

## 2022-01-19 MED ORDER — HYDRALAZINE HCL 25 MG PO TABS
25.0000 mg | ORAL_TABLET | Freq: Four times a day (QID) | ORAL | Status: DC | PRN
  Administered 2022-01-20: 25 mg via ORAL
  Filled 2022-01-19: qty 1

## 2022-01-19 MED ORDER — KETAMINE HCL 50 MG/5ML IJ SOSY
0.3000 mg/kg | PREFILLED_SYRINGE | Freq: Once | INTRAMUSCULAR | Status: AC
  Administered 2022-01-19: 21 mg via INTRAVENOUS
  Filled 2022-01-19: qty 5

## 2022-01-19 MED ORDER — SENNOSIDES-DOCUSATE SODIUM 8.6-50 MG PO TABS: 1.0000 | ORAL_TABLET | Freq: Every evening | ORAL | Status: DC | PRN

## 2022-01-19 MED ORDER — ONDANSETRON HCL 4 MG/2ML IJ SOLN
4.0000 mg | Freq: Once | INTRAMUSCULAR | Status: AC
  Administered 2022-01-19: 4 mg via INTRAVENOUS
  Filled 2022-01-19: qty 2

## 2022-01-19 MED ORDER — BISACODYL 5 MG PO TBEC
5.0000 mg | DELAYED_RELEASE_TABLET | Freq: Every day | ORAL | Status: DC | PRN
  Filled 2022-01-19: qty 1

## 2022-01-19 MED ORDER — FENTANYL CITRATE PF 50 MCG/ML IJ SOSY
12.5000 ug | PREFILLED_SYRINGE | INTRAMUSCULAR | Status: DC | PRN
  Administered 2022-01-19 – 2022-01-20 (×6): 50 ug via INTRAVENOUS
  Filled 2022-01-19 (×6): qty 1

## 2022-01-19 MED ORDER — PREDNISONE 5 MG PO TABS
5.0000 mg | ORAL_TABLET | Freq: Every day | ORAL | Status: DC
  Administered 2022-01-20 – 2022-01-25 (×5): 5 mg via ORAL
  Filled 2022-01-19 (×6): qty 1

## 2022-01-19 MED ORDER — ACETAMINOPHEN 500 MG PO TABS
1000.0000 mg | ORAL_TABLET | Freq: Three times a day (TID) | ORAL | Status: DC | PRN
  Administered 2022-01-21 (×2): 1000 mg via ORAL
  Filled 2022-01-19 (×3): qty 2

## 2022-01-19 MED ORDER — SODIUM CHLORIDE 0.9% IV SOLUTION: Freq: Once | INTRAVENOUS | Status: AC

## 2022-01-19 MED ORDER — TACROLIMUS 1 MG PO CAPS
2.0000 mg | ORAL_CAPSULE | Freq: Two times a day (BID) | ORAL | Status: DC
  Administered 2022-01-19 – 2022-01-25 (×11): 2 mg via ORAL
  Filled 2022-01-19 (×12): qty 2

## 2022-01-19 MED ORDER — HYDROCODONE-ACETAMINOPHEN 5-325 MG PO TABS
1.0000 | ORAL_TABLET | ORAL | Status: DC | PRN
  Administered 2022-01-19 – 2022-01-24 (×17): 1 via ORAL
  Filled 2022-01-19 (×18): qty 1

## 2022-01-19 MED ORDER — FENTANYL CITRATE PF 50 MCG/ML IJ SOSY
100.0000 ug | PREFILLED_SYRINGE | INTRAMUSCULAR | Status: DC | PRN
  Administered 2022-01-19: 100 ug via INTRAVENOUS
  Filled 2022-01-19: qty 2

## 2022-01-19 MED ORDER — ONDANSETRON HCL 4 MG PO TABS: 4.0000 mg | ORAL_TABLET | Freq: Four times a day (QID) | ORAL | Status: DC | PRN

## 2022-01-19 MED ORDER — PANTOPRAZOLE SODIUM 40 MG PO TBEC
40.0000 mg | DELAYED_RELEASE_TABLET | Freq: Two times a day (BID) | ORAL | Status: DC
  Administered 2022-01-19 – 2022-01-25 (×11): 40 mg via ORAL
  Filled 2022-01-19 (×11): qty 1

## 2022-01-19 MED ORDER — FENTANYL CITRATE PF 50 MCG/ML IJ SOSY
100.0000 ug | PREFILLED_SYRINGE | Freq: Once | INTRAMUSCULAR | Status: AC
  Administered 2022-01-19: 100 ug via INTRAVENOUS
  Filled 2022-01-19: qty 2

## 2022-01-19 NOTE — H&P (Addendum)
History and Physical    Christina Mack LOV:[id number] DOB: 28-Apr-1965 DOA: 01/19/2022  PCP: System, Provider Not In (Confirm with patient/family/NH records and if not entered, this has to be entered at [facility] point of entry) Patient coming from: Home  I have personally briefly reviewed patient's old medical records in [place]  Chief Complaint: I fell and hit my knee  HPI: Christina Mack is a 56 y.o. 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  Patient was walking her dog this morning, when the dog became uncontrolled and dragged the leash and patient suddenly lost her balance and fell on her right side. She landed on her right hip and knee.  If there is 10/10 pain of right knee and hip, unable to get up again backwards off.  Denies any prodromes of lightheadedness chest pain proliferation palpitations.  Her last dose of Eliquis was yesterday evening.  ED Course: X-ray of hip suspicious for intermittent femoral neck fracture, right knee has a large new effusion.  Blood work showed creatinine 6.79 compared to recent reading 6.1-6.7 since August 2023.  Hemoglobin 6.4, platelet 111.  Review of Systems: As per HPI otherwise 14 point review of systems negative.    Past Medical History:  Diagnosis Date   Complication of anesthesia    nausea and vomiting   DVT (deep venous thrombosis) (HCC)    History of osteomyelitis    Kidney transplant recipient    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[facility])     Past Surgical History:  Procedure Laterality Date   BIOPSY  04/24/2020   Procedure: BIOPSY;  Surgeon: Doran Stabler, MD;  Location: [place];  Service: Gastroenterology;;   ESOPHAGOGASTRODUODENOSCOPY (EGD) WITH PROPOFOL N/A 04/24/2020   Procedure: ESOPHAGOGASTRODUODENOSCOPY (EGD) WITH PROPOFOL;  Surgeon: Doran Stabler, MD;  Location: [place];  Service: Gastroenterology;  Laterality: N/A;   IR US GUIDE BX ASP/DRAIN  04/30/2020   KIDNEY TRANSPLANT  2001   KIDNEY TRANSPLANT  2014   LEEP     TOTAL ABDOMINAL HYSTERECTOMY     ovaries left in situ     reports that she has never smoked. She has never used smokeless tobacco. She reports that she does not currently use alcohol. She reports that she does not use drugs.  Allergies  Allergen Reactions   Oxycodone-Acetaminophen Itching, Nausea Only, Other (See Comments), Nausea And Vomiting and Rash    Other Reaction: HA Other Reaction: HA    Zosyn [Piperacillin Sod-Tazobactam So] Other (See Comments)    Thrombocytopenia   Hydromorphone Nausea And Vomiting, Rash and Other (See Comments)   Amlodipine Other (See Comments)    Ankle swelling   Codeine     vomiting    Family History  Problem Relation Age of Onset   Cerebral aneurysm Mother    AAA (abdominal aortic aneurysm) Brother    Breast cancer Paternal Aunt    Heart attack Maternal Grandmother    Brain cancer Maternal Grandfather    Colon cancer Neg Hx    Ovarian cancer Neg Hx    Endometrial cancer Neg Hx    Pancreatic cancer Neg Hx    Prostate cancer Neg Hx  Prior to Admission medications   Medication Sig Start Date End Date Taking? Authorizing Provider  acetaminophen (TYLENOL) 500 MG tablet Take 2 tablets (1,000 mg total) by mouth 3 (three) times daily. Patient taking differently: Take 1,000 mg by mouth 3 (three) times daily as needed for moderate pain. 05/05/20  Yes Gaylan Gerold, DO  apixaban (ELIQUIS) 2.5 MG TABS tablet TAKE 1 TABLET (2.5 MG TOTAL) BY MOUTH DAILY. Patient taking differently: Take 2.5 mg by mouth daily. 05/17/20 01/19/22 Yes Love, Ivan Anchors, PA-C  carvedilol  (COREG) 25 MG tablet Take 25 mg by mouth in the morning and at bedtime. 11/29/21  Yes [provider]  pantoprazole (PROTONIX) 40 MG tablet TAKE 1 TABLET (40 MG TOTAL) BY MOUTH 2 (TWO) TIMES DAILY BEFORE A MEAL. Patient taking differently: Take 40 mg by mouth 2 (two) times daily. 05/17/20 01/19/22 Yes Love, Ivan Anchors, PA-C  predniSONE (DELTASONE) 5 MG tablet Take 5 mg by mouth daily with breakfast.   Yes [provider]  sodium bicarbonate 650 MG tablet Take 1,300 mg by mouth 3 (three) times daily. 02/07/21  Yes [provider]  tacrolimus (PROGRAF) 1 MG capsule Take 2 mg by mouth in the morning and at bedtime.   Yes [provider]  triamcinolone ointment (KENALOG) 0.5 % Apply 1 Application topically 2 (two) times daily. 12/01/21  Yes [place] Callas, NP    Physical Exam: Vitals:   01/19/22 1556 01/19/22 1600 01/19/22 1624 01/19/22 1639  BP: (!) 159/93 (!) 156/93 (!) 147/97 (!) 159/98  Pulse: 85 83 85 89  Resp: (!) '9 13 15 18  '$ Temp:   98.4 F (36.9 C) 98.4 F (36.9 C)  TempSrc:   Oral Oral  SpO2: 100% 100%    Weight:      Height:        Constitutional: NAD, calm, comfortable Vitals:   01/19/22 1556 01/19/22 1600 01/19/22 1624 01/19/22 1639  BP: (!) 159/93 (!) 156/93 (!) 147/97 (!) 159/98  Pulse: 85 83 85 89  Resp: (!) '9 13 15 18  '$ Temp:   98.4 F (36.9 C) 98.4 F (36.9 C)  TempSrc:   Oral Oral  SpO2: 100% 100%    Weight:      Height:       Eyes: PERRL, lids and conjunctivae normal ENMT: Mucous membranes are moist. Posterior pharynx clear of any exudate or lesions.Normal dentition.  Neck: normal, supple, no masses, no thyromegaly Respiratory: clear to auscultation bilaterally, no wheezing, no crackles. Normal respiratory effort. No accessory muscle use.  Cardiovascular: Regular rate and rhythm, no murmurs / rubs / gallops. No extremity edema. 2+ pedal pulses. No carotid bruits.  Abdomen: no tenderness, no masses palpated. No  hepatosplenomegaly. Bowel sounds positive.  Musculoskeletal: Large right knee effusion, severe tenderness and decreased ROM due to pain Skin: no rashes, lesions, ulcers. No induration Neurologic: CN 2-12 grossly intact. Sensation intact, DTR normal. Strength 5/5 in all 4.  Psychiatric: Normal judgment and insight. Alert and oriented x 3. Normal mood.     Labs on Admission: I have personally reviewed following labs and imaging studies  CBC: Recent Labs  Lab 01/19/22 1251  WBC 2.9*  NEUTROABS 0.9*  HGB 6.4*  HCT 19.8*  MCV 96.6  PLT 983*   Basic Metabolic Panel: Recent Labs  Lab 01/19/22 1251  NA 142  K 4.0  CL 112*  CO2 21*  GLUCOSE 137*  BUN 82*  CREATININE 6.79*  CALCIUM 9.0   GFR: Estimated Creatinine Clearance:  9.2 mL/min (A) (by C-G formula based on SCr of 6.79 mg/dL (H)). Liver Function Tests: No results for input(s): "AST", "ALT", "ALKPHOS", "BILITOT", "PROT", "ALBUMIN" in the last 168 hours. No results for input(s): "LIPASE", "AMYLASE" in the last 168 hours. No results for input(s): "AMMONIA" in the last 168 hours. Coagulation Profile: Recent Labs  Lab 01/19/22 1251  INR 1.1   Cardiac Enzymes: No results for input(s): "CKTOTAL", "CKMB", "CKMBINDEX", "TROPONINI" in the last 168 hours. BNP (last 3 results) No results for input(s): "PROBNP" in the last 8760 hours. HbA1C: No results for input(s): "HGBA1C" in the last 72 hours. CBG: No results for input(s): "GLUCAP" in the last 168 hours. Lipid Profile: No results for input(s): "CHOL", "HDL", "LDLCALC", "TRIG", "CHOLHDL", "LDLDIRECT" in the last 72 hours. Thyroid Function Tests: No results for input(s): "TSH", "T4TOTAL", "FREET4", "T3FREE", "THYROIDAB" in the last 72 hours. Anemia Panel: No results for input(s): "VITAMINB12", "FOLATE", "FERRITIN", "TIBC", "IRON", "RETICCTPCT" in the last 72 hours. Urine analysis:    Component Value Date/Time   COLORURINE YELLOW 05/04/2020 1805   APPEARANCEUR CLEAR  05/04/2020 1805   LABSPEC 1.015 05/04/2020 1805   PHURINE 5.5 05/04/2020 1805   GLUCOSEU 100 (A) 05/04/2020 1805   HGBUR LARGE (A) 05/04/2020 1805   BILIRUBINUR negative 11/02/2020 1037   KETONESUR negative 11/02/2020 1037   KETONESUR NEGATIVE 05/04/2020 1805   PROTEINUR =100 (A) 11/02/2020 1037   PROTEINUR 30 (A) 05/04/2020 1805   UROBILINOGEN 0.2 11/02/2020 1037   NITRITE Negative 11/02/2020 1037   NITRITE NEGATIVE 05/04/2020 1805   LEUKOCYTESUR Moderate (2+) (A) 11/02/2020 1037   LEUKOCYTESUR MODERATE (A) 05/04/2020 1805    Radiological Exams on Admission: DG Hip Unilat W or Wo Pelvis 2-3 Views Right  Result Date: 01/19/2022 CLINICAL DATA:  Trauma, fall EXAM: DG HIP (WITH OR WITHOUT PELVIS) 2-3V RIGHT COMPARISON:  None Available. FINDINGS: There is minimal angulation in the lateral aspect of subcapital portion of neck of right femur. There is faint sclerosis in the subcapital portion of neck of right femur. There is lytic process in the right inferior pubic ramus. There is possible healing fracture in the lateral aspect of right superior pubic ramus. Arterial calcifications are seen in soft tissues. Surgical clips are seen in pelvis. IMPRESSION: There is mild cortical angulation in the lateral aspect of subcapital portion of neck of right femur suggesting possible recent fracture. Fracture line is difficult to visualize. Follow-up CT may be considered for further evaluation. There is healing fracture in the lateral aspect of right superior pubic ramus. There is a lytic lesion in the right inferior pubic ramus which may be due to neoplastic or infectious process. Electronically Signed   By: Elmer Picker M.D.   On: 01/19/2022 13:32   DG Chest 2 View  Result Date: 01/19/2022 CLINICAL DATA:  Trauma, fall EXAM: CHEST - 2 VIEW COMPARISON:  04/20/2020 FINDINGS: Transverse diameter of heart is increased. There are no signs of pulmonary edema or focal pulmonary consolidation. There is no  pleural effusion or pneumothorax. IMPRESSION: Cardiomegaly. There are no signs of pulmonary edema or focal pulmonary consolidation. Electronically Signed   By: Elmer Picker M.D.   On: 01/19/2022 13:26   DG Knee 2 Views Right  Result Date: 01/19/2022 CLINICAL DATA:  Fall.  Right knee pain and swelling. EXAM: RIGHT KNEE - 1-2 VIEW COMPARISON:  Right knee radiographs 12/12/2021 FINDINGS: There is again mildly decreased bone mineralization. There is a new moderate-to-large joint effusion. There is some fat density within the nondependent  aspect of the joint fluid, which may represent normal fat in this region just deep to the distal quadriceps tendon versus a layering fat-fluid level which would be suspicious for a lipohemarthrosis/radiographically occult fracture. No definite acute fracture is visualized. No dislocation. IMPRESSION: New moderate-to-large joint effusion. There is some fat density within the nondependent aspect of the joint fluid, which may represent normal fat in this region versus a layering fat-fluid level which would be suspicious for a lipohemarthrosis/radiographically occult fracture. Recommend clinical correlation. Electronically Signed   By: Yvonne Kendall M.D.   On: 01/19/2022 13:26    EKG: Independently reviewed.  Sinus rhythm, chronic QRS morphology compared to old EKG in 2022.  Assessment/Plan Principal Problem:   Symptomatic anemia Active Problems:   Anemia associated with acute blood loss  (please populate well all problems here in Problem List. (For example, if patient is on BP meds at home and you resume or decide to hold them, it is a problem that needs to be her. Same for CAD, COPD, HLD and so on)  Acute blood loss anemia -Likely patient developed a large hemarthrosis of right knee secondary to the total trauma sustained with the fall. -Orthopedic surgery consulted and who recommended CT right hip and right knee to further characterize physical exam and x-ray  findings -Last dose of Eliquis was yesterday evening, no indication for antibiotics at this point. -PRBC x1, recheck H&H tonight and tomorrow morning -Hold off Eliquis tonight, if H&H stable and no surgical intervention, likely can resume Eliquis versus DVT prophylaxis tomorrow. -Other DDx, peripheral smear pending to rule out TTP.  Clinically unlikely given the clinical course and history of sudden onset symptoms.  CKD stage IV with chronic transplant rejection -Worsening of creatinine level starting July 2023, she went to see her nephrology at [facility] 2 weeks ago, when she was diagnosed with chronic rejection.  And was instructed to increase tacrolimus but later decreased to 2 mg twice daily due to worsening of kidney function.  As per [facility] nephrology's note recently, I do tacrolimus level should be in the narrow range of 5-7.  Will check tacrolimus level today. -Clinically patient creatinine level stable and euvolemic -Continue prednisone.  Question of right hip fracture -She does have a chronic right hip pain, but no history of fracture or osteomyelitis of the right hip.  As per orthopedic surgery's recommendation, CT hip ordered. -If CT negative for fracture or dislocation,, start PT evaluation tomorrow.  History of chronic pelvic ulcer and osteomyelitis -Secondary to radiation therapy, she completed Zyvox treatment in September and last session of hyperbaric chamber treatment last week.  She reported much improved wound locally, and she is scheduled to see wound care center next Tuesday.  HTN, uncontrolled -Continue Coreg -Add Hydralazine, aiming at BP goal of 130/80  Valvar cancer -Completed chemo and radiation therapy, PET scan stable recently, outpatient follow-up with oncology.  Chronic DVT -Hold off Eliquis for now   DVT prophylaxis: SCD Code Status: Full code Family Communication: Husband at bedside Disposition Plan: Patient is sick with runny hemarthrosis, requiring  inpatient transfusion and orthopedic surgery consult, expect more than 2 midnight hospital stay. Consults called: Orthopedic surgery Admission status: MedSurg admission   Lequita Halt MD [facility] Pager [phone]  01/19/2022, 5:05 PM

## 2022-01-19 NOTE — ED Notes (Signed)
Patient transported to X-ray 

## 2022-01-19 NOTE — ED Triage Notes (Signed)
Cannot bare weight at this time

## 2022-01-19 NOTE — ED Notes (Signed)
Messaged MD about pain medication

## 2022-01-19 NOTE — ED Triage Notes (Signed)
Rt knee is swollen and pt is in severe pain

## 2022-01-19 NOTE — ED Triage Notes (Signed)
Pt brought from home walking dog at the at the 4th step fell face first no LOC no neck or back pain swelling and pain to the right knee and hip no deformities noted by ems

## 2022-01-19 NOTE — ED Notes (Signed)
When entering the room to draw labs and check on pt because I could hear her screaming from the desk the pt states "it just hurts so bad"  I explained that I had already messaged the MD and offered an ice pack or heating pad or was there anything else I could I do to help make her more comfortable.  The pt stated that she had an ice pack and it was on the floor.  I offered a new one because I didn't want her using something that was on the floor. The pt states that she already had the ice pack and threw it across the room because she was hurting so bad. MD aware

## 2022-01-19 NOTE — ED Notes (Signed)
Help place a external cath on patient family is at bedside and call bell in reach

## 2022-01-19 NOTE — ED Provider Notes (Signed)
[facility] EMERGENCY DEPARTMENT Provider Note   CSN: [id number] Arrival date & time: 01/19/22  1145     History  Chief Complaint  Patient presents with   Christina Mack is a 56 y.o. female.  Patient presents from home after recent fall. She was walking her dog and her dog went off causing her to be pulled down the stair. She has an injury on the right side of her face but denies head trauma or loss of consciousness. She landed on her right knee which she reports experiencing excruciating pain. Denies chest pain, dyspnea or other symptoms.   The history is provided by the patient.  Fall This is a new problem. Pertinent negatives include no chest pain and no shortness of breath. Nothing aggravates the symptoms. Nothing relieves the symptoms.       Home Medications Prior to Admission medications   Medication Sig Start Date End Date Taking? Authorizing Provider  acetaminophen (TYLENOL) 500 MG tablet Take 2 tablets (1,000 mg total) by mouth 3 (three) times daily. 05/05/20  Yes Gaylan Gerold, DO  apixaban (ELIQUIS) 2.5 MG TABS tablet TAKE 1 TABLET (2.5 MG TOTAL) BY MOUTH DAILY. Patient taking differently: Take 2.5 mg by mouth daily. 05/17/20 01/19/22 Yes Love, Ivan Anchors, PA-C  carvedilol (COREG) 25 MG tablet Take 25 mg by mouth in the morning and at bedtime. 11/29/21  Yes [provider]  pantoprazole (PROTONIX) 40 MG tablet TAKE 1 TABLET (40 MG TOTAL) BY MOUTH 2 (TWO) TIMES DAILY BEFORE A MEAL. 05/17/20 01/19/22 Yes Love, Ivan Anchors, PA-C  predniSONE (DELTASONE) 5 MG tablet Take 5 mg by mouth daily with breakfast.   Yes [provider]  sodium bicarbonate 650 MG tablet Take 1,300 mg by mouth in the morning, at noon, and at bedtime. 02/07/21  Yes [provider]  tacrolimus (PROGRAF) 1 MG capsule Take 2 mg by mouth in the morning and at bedtime.   Yes [provider]  triamcinolone ointment (KENALOG) 0.5 % Apply 1 Application  topically 2 (two) times daily. 12/01/21  Yes Bartelso Callas, NP  fluticasone (FLONASE) 50 MCG/ACT nasal spray Place 1 spray into both nostrils daily as needed. Patient not taking: Reported on 01/19/2022 05/16/17   [provider]      Allergies    Oxycodone-acetaminophen, Zosyn [piperacillin sod-tazobactam so], Hydromorphone, Amlodipine, and Codeine    Review of Systems   Review of Systems  Respiratory:  Negative for shortness of breath.   Cardiovascular:  Negative for chest pain.  Musculoskeletal:  Positive for joint swelling.       Right knee pain  All other systems reviewed and are negative.   Physical Exam Updated Vital Signs BP (!) 166/110   Pulse 87   Temp 97.9 F (36.6 C) (Oral)   Resp 13   Ht '5\' 5"'$  (1.651 m)   Wt 69 kg   SpO2 100%   BMI 25.31 kg/m  Physical Exam Vitals reviewed.  Constitutional:      General: She is in acute distress (secondary to pain).     Appearance: Normal appearance.  HENT:     Head: Normocephalic and atraumatic.     Right Ear: External ear normal.     Left Ear: External ear normal.     Nose: Nose normal.     Mouth/Throat:     Mouth: Mucous membranes are moist.  Eyes:     General: No scleral icterus.  Right eye: No discharge.        Left eye: No discharge.     Extraocular Movements: Extraocular movements intact.     Conjunctiva/sclera: Conjunctivae normal.     Pupils: Pupils are equal, round, and reactive to light.  Cardiovascular:     Rate and Rhythm: Normal rate and regular rhythm.     Pulses: Normal pulses.     Heart sounds: Normal heart sounds. No murmur heard.    No friction rub. No gallop.  Pulmonary:     Effort: Pulmonary effort is normal. No respiratory distress.     Breath sounds: Normal breath sounds. No stridor. No wheezing, rhonchi or rales.  Abdominal:     General: Abdomen is flat. Bowel sounds are normal. There is no distension.     Palpations: Abdomen is soft. There is no mass.     Tenderness:  There is no abdominal tenderness. There is no guarding or rebound.     Hernia: No hernia is present.  Musculoskeletal:        General: Swelling, tenderness and signs of injury present.     Cervical back: Normal range of motion and neck supple. No rigidity or tenderness.     Comments: Right knee edema and tenderness with deep palpation of patella, effusion along right knee, no erythema noted, ecchymosis noted along upper extremities bilaterally that patient states occurred last week, limited active ROM limited by pain, radial and distal pulses strong and equal bilaterally  Lymphadenopathy:     Cervical: No cervical adenopathy.  Neurological:     Mental Status: She is alert and oriented to person, place, and time.     Cranial Nerves: No cranial nerve deficit.     Sensory: No sensory deficit.     Motor: No weakness.     ED Results / Procedures / Treatments   Labs (all labs ordered are listed, but only abnormal results are displayed) Labs Reviewed  BASIC METABOLIC PANEL - Abnormal; Notable for the following components:      Result Value   Chloride 112 (*)    CO2 21 (*)    Glucose, Bld 137 (*)    BUN 82 (*)    Creatinine, Ser 6.79 (*)    GFR, Estimated 7 (*)    All other components within normal limits  CBC WITH DIFFERENTIAL/PLATELET - Abnormal; Notable for the following components:   WBC 2.9 (*)    RBC 2.05 (*)    Hemoglobin 6.4 (*)    HCT 19.8 (*)    RDW 15.9 (*)    Platelets 111 (*)    nRBC 0.7 (*)    Neutro Abs 0.9 (*)    Abs Immature Granulocytes 0.09 (*)    All other components within normal limits  PROTIME-INR  PATHOLOGIST SMEAR REVIEW  TACROLIMUS LEVEL  TYPE AND SCREEN    EKG None  Radiology DG Hip Unilat W or Wo Pelvis 2-3 Views Right  Result Date: 01/19/2022 CLINICAL DATA:  Trauma, fall EXAM: DG HIP (WITH OR WITHOUT PELVIS) 2-3V RIGHT COMPARISON:  None Available. FINDINGS: There is minimal angulation in the lateral aspect of subcapital portion of neck of  right femur. There is faint sclerosis in the subcapital portion of neck of right femur. There is lytic process in the right inferior pubic ramus. There is possible healing fracture in the lateral aspect of right superior pubic ramus. Arterial calcifications are seen in soft tissues. Surgical clips are seen in pelvis. IMPRESSION: There is mild cortical angulation in  the lateral aspect of subcapital portion of neck of right femur suggesting possible recent fracture. Fracture line is difficult to visualize. Follow-up CT may be considered for further evaluation. There is healing fracture in the lateral aspect of right superior pubic ramus. There is a lytic lesion in the right inferior pubic ramus which may be due to neoplastic or infectious process. Electronically Signed   By: Elmer Picker M.D.   On: 01/19/2022 13:32   DG Chest 2 View  Result Date: 01/19/2022 CLINICAL DATA:  Trauma, fall EXAM: CHEST - 2 VIEW COMPARISON:  04/20/2020 FINDINGS: Transverse diameter of heart is increased. There are no signs of pulmonary edema or focal pulmonary consolidation. There is no pleural effusion or pneumothorax. IMPRESSION: Cardiomegaly. There are no signs of pulmonary edema or focal pulmonary consolidation. Electronically Signed   By: Elmer Picker M.D.   On: 01/19/2022 13:26   DG Knee 2 Views Right  Result Date: 01/19/2022 CLINICAL DATA:  Fall.  Right knee pain and swelling. EXAM: RIGHT KNEE - 1-2 VIEW COMPARISON:  Right knee radiographs 12/12/2021 FINDINGS: There is again mildly decreased bone mineralization. There is a new moderate-to-large joint effusion. There is some fat density within the nondependent aspect of the joint fluid, which may represent normal fat in this region just deep to the distal quadriceps tendon versus a layering fat-fluid level which would be suspicious for a lipohemarthrosis/radiographically occult fracture. No definite acute fracture is visualized. No dislocation. IMPRESSION: New  moderate-to-large joint effusion. There is some fat density within the nondependent aspect of the joint fluid, which may represent normal fat in this region versus a layering fat-fluid level which would be suspicious for a lipohemarthrosis/radiographically occult fracture. Recommend clinical correlation. Electronically Signed   By: Yvonne Kendall M.D.   On: 01/19/2022 13:26    Procedures Procedures  None  Medications Ordered in ED Medications  fentaNYL (SUBLIMAZE) injection 100 mcg (100 mcg Intravenous Given 01/19/22 1419)  ketamine 50 mg in normal saline 5 mL (10 mg/mL) syringe (has no administration in time range)  fentaNYL (SUBLIMAZE) injection 100 mcg (100 mcg Intravenous Given 01/19/22 1234)  ondansetron (ZOFRAN) injection 4 mg (4 mg Intravenous Given 01/19/22 1233)    ED Course/ Medical Decision Making/ A&P                           Medical Decision Making Amount and/or Complexity of Data Reviewed Labs: ordered. Radiology: ordered.  Risk Prescription drug management. Decision regarding hospitalization.   Patient on eliquis presents after a fall, primarily concerned with right knee pain. On exam, patient doing well other than significant edema along right patella with tenderness upon light palpation but negative tenderness within the popliteal fossa or calf. Low concern for head trauma although will order CT head to ensure that patient does not have intracranial abnormality or hemorrhage. CT head pending. CXR demonstrated cardiomegaly without signs of pulmonary edema or focal pulmonary consolidation. Imaging demonstrates mild cortical angulation in the lateral aspect of the subcapital portion of the neck of the right femur suggesting possible recent fracture. Healing fracture in the lateral aspect of the right superior pubic ramus and a lytic lesion in the right inferior pubic ramus which may be due to a neoplastic or infectious process likely secondary to recent osteomyelitis. Also  revealed new moderate to large joint effusion with some fat density within the nondependent aspect of the joint fluid that may represent normal fat within the region vs layering fat  fluid level suspicion for lipohemarthrosis/occult fracture. Hgb 6.4 in the setting of recent trauma concerning for hemarthrosis. Baseline Hgb 7-8. AKI with Cr increase to 6.79 from 2.93 most recently a year ago. Unsure if patient having steadily worsening CKD over the past year or this is a sudden, acute change. Of note, patient also has a history of renal transplant. Further CT right hip and knee imaging pending. Consulted ortho who will see patient. Spoke with Dr. Roosevelt Locks who agrees to admit patient for ongoing intervention and pain management.   Final Clinical Impression(s) / ED Diagnoses Final diagnoses:  AKI (acute kidney injury) ([place])  Fall, initial encounter    Rx / DC Orders ED Discharge Orders     None         Donney Dice, DO 01/19/22 1522    Margette Fast, MD 01/23/22 1515

## 2022-01-20 ENCOUNTER — Encounter (HOSPITAL_COMMUNITY): Payer: Self-pay | Admitting: Internal Medicine

## 2022-01-20 ENCOUNTER — Inpatient Hospital Stay (HOSPITAL_COMMUNITY)

## 2022-01-20 DIAGNOSIS — D649 Anemia, unspecified: Secondary | ICD-10-CM | POA: Diagnosis not present

## 2022-01-20 DIAGNOSIS — S72001A Fracture of unspecified part of neck of right femur, initial encounter for closed fracture: Secondary | ICD-10-CM | POA: Diagnosis not present

## 2022-01-20 LAB — CBC
HCT: 21.4 % — ABNORMAL LOW (ref 36.0–46.0)
Hemoglobin: 7 g/dL — ABNORMAL LOW (ref 12.0–15.0)
MCH: 30.7 pg (ref 26.0–34.0)
MCHC: 32.7 g/dL (ref 30.0–36.0)
MCV: 93.9 fL (ref 80.0–100.0)
Platelets: 103 10*3/uL — ABNORMAL LOW (ref 150–400)
RBC: 2.28 MIL/uL — ABNORMAL LOW (ref 3.87–5.11)
RDW: 17.6 % — ABNORMAL HIGH (ref 11.5–15.5)
WBC: 3.9 10*3/uL — ABNORMAL LOW (ref 4.0–10.5)
nRBC: 0.8 % — ABNORMAL HIGH (ref 0.0–0.2)

## 2022-01-20 LAB — BASIC METABOLIC PANEL
Anion gap: 11 (ref 5–15)
BUN: 76 mg/dL — ABNORMAL HIGH (ref 6–20)
CO2: 20 mmol/L — ABNORMAL LOW (ref 22–32)
Calcium: 8.9 mg/dL (ref 8.9–10.3)
Chloride: 109 mmol/L (ref 98–111)
Creatinine, Ser: 6.57 mg/dL — ABNORMAL HIGH (ref 0.44–1.00)
GFR, Estimated: 7 mL/min — ABNORMAL LOW (ref >60)
Glucose, Bld: 126 mg/dL — ABNORMAL HIGH (ref 70–99)
Potassium: 4.2 mmol/L (ref 3.5–5.1)
Sodium: 140 mmol/L (ref 135–145)

## 2022-01-20 LAB — HIV ANTIBODY (ROUTINE TESTING W REFLEX): HIV Screen 4th Generation wRfx: NONREACTIVE

## 2022-01-20 LAB — HEMOGLOBIN AND HEMATOCRIT, BLOOD
HCT: 21.8 % — ABNORMAL LOW (ref 36.0–46.0)
Hemoglobin: 7 g/dL — ABNORMAL LOW (ref 12.0–15.0)

## 2022-01-20 LAB — PREPARE RBC (CROSSMATCH)

## 2022-01-20 MED ORDER — FENTANYL CITRATE PF 50 MCG/ML IJ SOSY
12.5000 ug | PREFILLED_SYRINGE | INTRAMUSCULAR | Status: DC | PRN
  Administered 2022-01-20: 50 ug via INTRAVENOUS
  Filled 2022-01-20 (×2): qty 1

## 2022-01-20 MED ORDER — MORPHINE SULFATE (PF) 2 MG/ML IV SOLN: 2.0000 mg | INTRAVENOUS | Status: DC | PRN

## 2022-01-20 MED ORDER — SODIUM CHLORIDE 0.9% IV SOLUTION: Freq: Once | INTRAVENOUS | Status: AC

## 2022-01-20 NOTE — ED Notes (Signed)
Admitting MD here to see pt.

## 2022-01-20 NOTE — Consult Note (Signed)
ORTHOPAEDIC CONSULTATION  REQUESTING PHYSICIAN: Lequita Halt, MD  Chief Complaint: "My right leg hurts"  HPI: Christina Mack is a 56 y.o. female who presents with right leg pain following fall yesterday.  She was walking her dog when her dog pulled her down several stairs and she fell landing on her right leg.  She had onset of severe right hip and right knee pain with significant swelling of the right knee.  No loss consciousness.  She has no history of prior difficulty with her right hip or right knee.  Had left knee scope about 15 years ago with good relief.  No other joints are bothering her.  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  Has history of DVT for which she takes Eliquis (last dose in the evening of 10/18).  She also has chronic unhealing perineum ulcer.  History of chronic anemia and hemoglobin of 7.0 most recently.  She had 2 units of blood transfused but CBC has not been rechecked since then.  Past Medical History:  Diagnosis Date   Complication of anesthesia    nausea and vomiting   DVT (deep venous thrombosis) (HCC)    History of osteomyelitis    Kidney transplant recipient    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[facility])    Past Surgical History:  Procedure Laterality Date   BIOPSY  04/24/2020   Procedure: BIOPSY;  Surgeon: Doran Stabler, MD;  Location: [place];  Service: Gastroenterology;;   ESOPHAGOGASTRODUODENOSCOPY (EGD) WITH PROPOFOL N/A 04/24/2020   Procedure: ESOPHAGOGASTRODUODENOSCOPY (EGD) WITH PROPOFOL;  Surgeon: Doran Stabler, MD;  Location: [place];  Service: Gastroenterology;  Laterality: N/A;   IR US GUIDE BX ASP/DRAIN  04/30/2020   KIDNEY TRANSPLANT  2001   KIDNEY TRANSPLANT  2014   LEEP     TOTAL ABDOMINAL HYSTERECTOMY     ovaries left in situ   Social History   Socioeconomic History   Marital status: Married     Spouse name: Not on file   Number of children: Not on file   Years of education: Not on file   Highest education level: Not on file  Occupational History   Not on file  Tobacco Use   Smoking status: Never   Smokeless tobacco: Never  Vaping Use   Vaping Use: Never used  Substance and Sexual Activity   Alcohol use: Not Currently   Drug use: Never   Sexual activity: Not Currently  Other Topics Concern   Not on file  Social History Narrative   Not on file   Social Determinants of Health   Financial Resource Strain: Not on file  Food Insecurity: No Food Insecurity (01/20/2022)   Hunger Vital Sign    Worried About Running Out of Food in the Last Year: Never true    Ran Out of Food in the Last Year: Never true  Transportation Needs: No Transportation Needs (01/20/2022)   PRAPARE - Hydrologist (Medical): No    Lack of Transportation (Non-Medical): No  Physical Activity: Not on file  Stress: Not on file  Social Connections: Not on file   Family History  Problem Relation Age of Onset   Cerebral aneurysm Mother    AAA (abdominal aortic aneurysm) Brother    Breast cancer Paternal Aunt    Heart attack Maternal Grandmother    Brain cancer Maternal  Grandfather    Colon cancer Neg Hx    Ovarian cancer Neg Hx    Endometrial cancer Neg Hx    Pancreatic cancer Neg Hx    Prostate cancer Neg Hx    - negative except otherwise stated in the family history section Allergies  Allergen Reactions   Oxycodone-Acetaminophen Itching, Nausea Only, Other (See Comments), Nausea And Vomiting and Rash    Other Reaction: HA Other Reaction: HA    Zosyn [Piperacillin Sod-Tazobactam So] Other (See Comments)    Thrombocytopenia   Hydromorphone Nausea And Vomiting, Rash and Other (See Comments)   Amlodipine Other (See Comments)    Ankle swelling   Codeine     vomiting   Prior to Admission medications   Medication Sig Start Date End Date Taking? Authorizing Provider   acetaminophen (TYLENOL) 500 MG tablet Take 2 tablets (1,000 mg total) by mouth 3 (three) times daily. Patient taking differently: Take 1,000 mg by mouth 3 (three) times daily as needed for moderate pain. 05/05/20  Yes Gaylan Gerold, DO  apixaban (ELIQUIS) 2.5 MG TABS tablet TAKE 1 TABLET (2.5 MG TOTAL) BY MOUTH DAILY. Patient taking differently: Take 2.5 mg by mouth daily. 05/17/20 01/19/22 Yes Love, Ivan Anchors, PA-C  carvedilol (COREG) 25 MG tablet Take 25 mg by mouth in the morning and at bedtime. 11/29/21  Yes [provider]  pantoprazole (PROTONIX) 40 MG tablet TAKE 1 TABLET (40 MG TOTAL) BY MOUTH 2 (TWO) TIMES DAILY BEFORE A MEAL. Patient taking differently: Take 40 mg by mouth 2 (two) times daily. 05/17/20 01/19/22 Yes Love, Ivan Anchors, PA-C  predniSONE (DELTASONE) 5 MG tablet Take 5 mg by mouth daily with breakfast.   Yes [provider]  sodium bicarbonate 650 MG tablet Take 1,300 mg by mouth 3 (three) times daily. 02/07/21  Yes [provider]  tacrolimus (PROGRAF) 1 MG capsule Take 2 mg by mouth in the morning and at bedtime.   Yes [provider]  triamcinolone ointment (KENALOG) 0.5 % Apply 1 Application topically 2 (two) times daily. 12/01/21  Yes Rutherford Callas, NP   CT Knee Right Wo Contrast  Result Date: 01/19/2022 CLINICAL DATA:  Knee trauma occult fracture suspected EXAM: CT OF THE RIGHT KNEE WITHOUT CONTRAST TECHNIQUE: Multidetector CT imaging of the right knee was performed according to the standard protocol. Multiplanar CT image reconstructions were also generated. RADIATION DOSE REDUCTION: This exam was performed according to the departmental dose-optimization program which includes automated exposure control, adjustment of the mA and/or kV according to patient size and/or use of iterative reconstruction technique. COMPARISON:  Radiographs of the knee FINDINGS: Bones/Joint/Cartilage No definite acute fracture. Large knee joint effusion with fat and  hematocrit levels compatible with lipohemarthrosis. Ligaments Suboptimally assessed by CT. Muscles and Tendons Unremarkable CT appearance. Soft tissues Mild subcutaneous edema about the knee anterolaterally. IMPRESSION: No definite acute fracture. Large lipohemarthrosis suspicious for occult fracture. Consider MRI for further evaluation. Electronically Signed   By: Placido Sou M.D.   On: 01/19/2022 20:53   CT HIP RIGHT WO CONTRAST  Result Date: 01/19/2022 CLINICAL DATA:  Fall with right hip pain found to have right femoral fracture on radiograph. EXAM: CT OF THE RIGHT HIP WITHOUT CONTRAST TECHNIQUE: Multidetector CT imaging of the right hip was performed according to the standard protocol. Multiplanar CT image reconstructions were also generated. RADIATION DOSE REDUCTION: This exam was performed according to the departmental dose-optimization program which includes automated exposure control, adjustment of the mA and/or kV according to  patient size and/or use of iterative reconstruction technique. COMPARISON:  Pelvis radiograph dated 01/19/2022, MR pelvis dated 04/22/2021 FINDINGS: Bones/Joint/Cartilage Subcapital right femoral fracture with mild apex anterior angulation. Nonunited pathologic fracture of the lateral right superior pubic ramus is again seen. Lytic lesion of the inferior pubic ramus with cortical disruption underlying the peroneal soft tissue defect. There is heterogeneous appearance of the pubic symphysis. Ligaments Suboptimally assessed by CT. Muscles and Tendons Normal. Soft tissues Partially imaged right lower quadrant transplant kidney. Hydronephrosis. No calculi in the visualized portions. Trace pelvic free fluid. Similar mild presacral stranding or free fluid. Aortic atherosclerosis. Findings related to vulvar carcinoma are better evaluated on prior MRI and nuclear medicine PET. IMPRESSION: 1. Subcapital right femoral fracture with mild apex anterior angulation. 2. Similar Nonunited  pathologic fracture of the lateral right superior pubic ramus. 3. Lytic lesion of the inferior pubic ramus with cortical disruption underlying the peroneal soft tissue defect, better evaluated on prior. 4. Findings related to vulvar carcinoma are better evaluated on prior MRI and nuclear medicine PET. 5. Aortic Atherosclerosis (ICD10-I70.0). Electronically Signed   By: Darrin Nipper M.D.   On: 01/19/2022 19:46   DG Hip Unilat W or Wo Pelvis 2-3 Views Right  Result Date: 01/19/2022 CLINICAL DATA:  Trauma, fall EXAM: DG HIP (WITH OR WITHOUT PELVIS) 2-3V RIGHT COMPARISON:  None Available. FINDINGS: There is minimal angulation in the lateral aspect of subcapital portion of neck of right femur. There is faint sclerosis in the subcapital portion of neck of right femur. There is lytic process in the right inferior pubic ramus. There is possible healing fracture in the lateral aspect of right superior pubic ramus. Arterial calcifications are seen in soft tissues. Surgical clips are seen in pelvis. IMPRESSION: There is mild cortical angulation in the lateral aspect of subcapital portion of neck of right femur suggesting possible recent fracture. Fracture line is difficult to visualize. Follow-up CT may be considered for further evaluation. There is healing fracture in the lateral aspect of right superior pubic ramus. There is a lytic lesion in the right inferior pubic ramus which may be due to neoplastic or infectious process. Electronically Signed   By: Elmer Picker M.D.   On: 01/19/2022 13:32   DG Chest 2 View  Result Date: 01/19/2022 CLINICAL DATA:  Trauma, fall EXAM: CHEST - 2 VIEW COMPARISON:  04/20/2020 FINDINGS: Transverse diameter of heart is increased. There are no signs of pulmonary edema or focal pulmonary consolidation. There is no pleural effusion or pneumothorax. IMPRESSION: Cardiomegaly. There are no signs of pulmonary edema or focal pulmonary consolidation. Electronically Signed   By: Elmer Picker M.D.   On: 01/19/2022 13:26   DG Knee 2 Views Right  Result Date: 01/19/2022 CLINICAL DATA:  Fall.  Right knee pain and swelling. EXAM: RIGHT KNEE - 1-2 VIEW COMPARISON:  Right knee radiographs 12/12/2021 FINDINGS: There is again mildly decreased bone mineralization. There is a new moderate-to-large joint effusion. There is some fat density within the nondependent aspect of the joint fluid, which may represent normal fat in this region just deep to the distal quadriceps tendon versus a layering fat-fluid level which would be suspicious for a lipohemarthrosis/radiographically occult fracture. No definite acute fracture is visualized. No dislocation. IMPRESSION: New moderate-to-large joint effusion. There is some fat density within the nondependent aspect of the joint fluid, which may represent normal fat in this region versus a layering fat-fluid level which would be suspicious for a lipohemarthrosis/radiographically occult fracture. Recommend clinical correlation. Electronically  Signed   By: Yvonne Kendall M.D.   On: 01/19/2022 13:26   - pertinent xrays, CT, MRI studies were reviewed and independently interpreted  Positive ROS: All other systems have been reviewed and were otherwise negative with the exception of those mentioned in the HPI and as above.  Physical Exam: General: Alert, no acute distress Psychiatric: Patient is competent for consent with normal mood and affect Lymphatic: No axillary or cervical lymphadenopathy Cardiovascular: No pedal edema Respiratory: No cyanosis, no use of accessory musculature GI: No organomegaly, abdomen is soft and non-tender    Images:  '@ENCIMAGES'$ @  Labs:  Lab Results  Component Value Date   ESRSEDRATE 120 (H) 06/08/2020   ESRSEDRATE 70 (H) 05/05/2020   ESRSEDRATE 73 (H) 04/27/2020   CRP 13.3 (H) 06/08/2020   CRP 4.9 (H) 05/13/2020   CRP 4.4 (H) 05/06/2020   REPTSTATUS 05/10/2020 FINAL 04/30/2020   GRAMSTAIN NO WBC SEEN NO  ORGANISMS SEEN  04/30/2020   CULT  04/30/2020    RARE ENTEROCOCCUS FAECIUM VANCOMYCIN RESISTANT ENTEROCOCCUS ISOLATED NO ANAEROBES ISOLATED SEE SEPARATE REPORT FOR DAPTOMYCIN RESULT Performed at [employer] Performed at [facility], [address], [place], [place] [address]    LABORGA ENTEROCOCCUS FAECIUM 04/30/2020    Lab Results  Component Value Date   ALBUMIN 1.9 (L) 05/06/2020   ALBUMIN 2.0 (L) 04/24/2020   ALBUMIN 1.9 (L) 04/23/2020    Neurologic: Patient does not have protective sensation bilateral lower extremities.   MUSCULOSKELETAL:   Ortho exam demonstrates full active and passive range of motion of bilateral shoulders, elbow, wrist, fingers.  Small amount of ecchymosis along the right forearm but she states that this is from running into a doorway several weeks prior and she is not having any pain in this location currently.  She has no knee effusion in the left knee.  She has intact ankle dorsiflexion and plantarflexion bilaterally.  2+ DP pulse of the bilateral lower extremities.  She has no pain with hip range of motion in the left side.  She has no tenderness throughout the femur, knee, tib-fib region, ankle, foot on the left.  No tenderness along the right foot, ankle, tib-fib region.  She does have increased pain with logrolling of the right hip.  She also has a large effusion noted in the right knee without significant erythema or any sinus tract noted.  Assessment: Subcapital fracture of the right femur with large lipohemarthrosis of the right knee  Plan: Plan for MRI evaluation of the right knee to further evaluate for occult fracture.  CT scan does appear to show a possible fracture line in the tibial plateau noted on coronal view.  Pending the results of the MRI, she will need surgical intervention for her subcapital femur fracture with either screw fixation versus total hip replacement.  Her preference is for total hip replacement to allow for  increased mobility potentially, though her mobility after hip replacement is still dependent on pathology present in her knee.  She is at higher risk for complication from total hip replacement given her history of chronic osteomyelitis of the pelvis.  Plan to make more definitive surgical plan based on MRI results.  Continue holding Eliquis.  Anticipate surgical intervention likely on Sunday.  Thank you for the consult and the opportunity to see Ms. [address] [place], [place] [phone] 6:47 PM

## 2022-01-20 NOTE — ED Notes (Signed)
Pt was helped up to [facility] to void

## 2022-01-20 NOTE — ED Notes (Signed)
Secretary ordered hospital bed so pt may be moved for comfort until room available upstairs.  Pt notified

## 2022-01-20 NOTE — Progress Notes (Signed)
PROGRESS NOTE  Christina Mack  DOB: 14-Aug-1965  PCP: System, Provider Not In KZS:[id number]  DOA: 01/19/2022  LOS: 1 day  Hospital Day: 2  Brief narrative: Christina Mack is a 56 y.o. 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   10/19, patient presented with mechanical fall leading to right knee swelling. She was walking her dog was became uncontrolled and dragged the leash.  Patient suddenly lost her balance and fell on her right side landing on her right hip and knee.  She had severe pain in the right knee and hip, was not able to get up.   Her last dose of Eliquis was on the evening of 10/18.  In the ED, patient was afebrile, blood pressure elevated to 150s to 170s, breathing on room air. Labs with WC count 2.9, hemoglobin low at 6.4, platelet low at 111, BUN/creatinine elevated to 82/6.79  CT scans of the right knee and right hip were done with findings as below:  1. Subcapital right femoral fracture with mild apex anterior angulation. 2. Similar Nonunited pathologic fracture of the lateral right superior pubic ramus. 3. Lytic lesion of the inferior pubic ramus with cortical disruption underlying the peroneal soft tissue defect,  4. No definite acute fracture of the right knee area. Large lipohemarthrosis suspicious for occult fracture. Consider MRI for further evaluation.  Admitted to [facility] consulted  Subjective: Patient was seen and examined this morning.  Pleasant middle-aged Caucasian female.  Lying on bed.  Pain controlled at the time of my evaluation.  Remains n.p.o.  Wants to eat. Chart reviewed Afebrile, heart rate in 80s, blood pressure in 150s Labs from this morning showed hemoglobin at 7, platelet  103, BUN/creatinine 76/6.57.  Assessment and plan: Acute subcapital right femur fracture Presented after mechanical fall leading to right hip pain CT scan finding as above. Orthopedics consulted to Dr. Sharol Given For pain control, patient is currently on Norco as needed.  I will add IV morphine as needed for severe pain.  Right pubic fracture CT scan also noted nonunited pathological fracture of the right superior pubic ramus It also showed likely lesion of the inferior pubic ramus with cortical disruption underlying the perineal soft tissue defect.  It is probably related to patient's pre-existing vulvar cancer. Orthopedics to comment.  Acute blood loss anemia secondary to large hemarthrosis of the right knee after the fall Last dose of Eliquis was on the night of 10/18. Currently Eliquis is on hold. Initial hemoglobin low at 6.4.  1 unit of PRBC was transfused.  Hemoglobin repeated this morning is at 7.  Discussed with patient.  We will give 1 more unit of PRBC transfusion today. Recent Labs    01/19/22 1251 01/19/22 2332 01/20/22 0511  HGB 6.4* 7.0* 7.0*  MCV 96.6  --  93.9   s/p renal transplant with chronic rejection  CKD stage IV  Baseline creatinine over 6. Per H&P, worsening of creatinine level started July 2023, she went to see her nephrology at [facility] 2 weeks ago, when she was diagnosed with chronic rejection.  And was instructed to increase tacrolimus but later decreased to 2 mg twice daily due to worsening of kidney function.  As per [facility] nephrology's note recently,  tacrolimus level should be in the narrow  range of 5-7.   Tacrolimus level sent.  Pending results. Continue prednisone Recent Labs    01/19/22 1251 01/20/22 0511  BUN 82* 76*  CREATININE 6.79* 6.57*   History of vulvar cancer Completed chemo and radiation therapy, PET scan stable recently, outpatient followup with oncology.  History of chronic pelvic ulcer and osteomyelitis Secondary to radiation therapy,  she completed Zyvox treatment in September and last session of hyperbaric chamber treatment last week.  She reported much improved wound locally, and she is scheduled to see wound care center next Tuesday.   HTN Continue Coreg Add Hydralazine, aiming at BP goal of 130/80   Chronic DVT She had DVT in right thigh in 2021 during chemo and radiation.  She is on Eliquis since then.   Currently on hold preoperatively. SCDs  Goals of care   Code Status: Full Code    Mobility: Bedrest ordered for now.  Needs PT eval after procedure  Skin assessment:     Nutritional status:  Body mass index is 25.31 kg/m.          Diet:  Diet Order             Diet regular Room service appropriate? Yes; Fluid consistency: Thin  Diet effective now                   DVT prophylaxis:  SCDs Start: 01/19/22 1616   Antimicrobials: None Fluid: None Consultants: Orthopedics Family Communication: None at bedside  Status is: Inpatient  Continue inhospital care because: Pending hip fracture surgery Level of care: Med-Surg   Dispo: The patient is from: Home              Anticipated d/c is to: Pending clinical course              Patient currently is not medically stable to d/c.   Difficult to place patient No     Infusions:    Scheduled Meds:  sodium chloride   Intravenous Once   carvedilol  25 mg Oral BID WC   pantoprazole  40 mg Oral BID   predniSONE  5 mg Oral Q breakfast   sodium bicarbonate  1,300 mg Oral TID   tacrolimus  2 mg Oral BID    PRN meds: acetaminophen, bisacodyl, hydrALAZINE, HYDROcodone-acetaminophen, morphine injection, ondansetron **OR** ondansetron (ZOFRAN) IV, senna-docusate   Antimicrobials: Anti-infectives (From admission, onward)    None       Objective: Vitals:   01/20/22 0857 01/20/22 1000  BP:  (!) 163/97  Pulse: 91 88  Resp:  14  Temp: 99 F (37.2 C)   SpO2: 98% 94%    Intake/Output Summary (Last 24 hours) at 01/20/2022  1151 Last data filed at 01/20/2022 0059 Gross per 24 hour  Intake 315 ml  Output 500 ml  Net -185 ml   Filed Weights   01/19/22 1146  Weight: 69 kg   Weight change:  Body mass index is 25.31 kg/m.   Physical Exam: General exam: Pleasant middle-aged Caucasian female.  Pain controlled at this time Skin: No rashes, lesions or ulcers. HEENT: Atraumatic, normocephalic, no obvious bleeding Lungs: Clear to auscultation bilaterally CVS: Regular rate and rhythm, no murmur GI/Abd: Nontender, nondistended, bowel sound present CNS: Alert, awake, oriented x3 Psychiatry: Mood appropriate Extremities: Right knee, thigh and knee.  Grossly swollen.  No external injury noted.  Data Review: I have personally reviewed the laboratory data and studies available.  F/u labs ordered Unresulted Labs (From admission, onward)  Start     Ordered   01/21/22 0500  CBC with Differential/Platelet  Tomorrow morning,   R        01/20/22 1151   01/21/22 4035  Basic metabolic panel  Tomorrow morning,   R        01/20/22 1151   01/19/22 1517  Tacrolimus level  Add-on,   AD        01/19/22 1516   01/19/22 1251  Pathologist smear review  Once,   R        01/19/22 1251            Signed, Terrilee Croak, MD [facility] 01/20/2022

## 2022-01-20 NOTE — Consult Note (Signed)
ORTHOPAEDIC CONSULTATION  REQUESTING PHYSICIAN: Terrilee Croak, MD  Chief Complaint: Right hip and right knee pain.  HPI: Christina Mack is a 56 y.o. female who presents with acute trauma with fall while walking her dog.  Patient had no loss of consciousness.  No syncopal event.  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  Past Medical History:  Diagnosis Date   Complication of anesthesia    nausea and vomiting   DVT (deep venous thrombosis) (HCC)    History of osteomyelitis    Kidney transplant recipient    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[facility])    Past Surgical History:  Procedure Laterality Date   BIOPSY  04/24/2020   Procedure: BIOPSY;  Surgeon: Doran Stabler, MD;  Location: [place];  Service: Gastroenterology;;   ESOPHAGOGASTRODUODENOSCOPY (EGD) WITH PROPOFOL N/A 04/24/2020   Procedure: ESOPHAGOGASTRODUODENOSCOPY (EGD) WITH PROPOFOL;  Surgeon: Doran Stabler, MD;  Location: [place];  Service: Gastroenterology;  Laterality: N/A;   IR US GUIDE BX ASP/DRAIN  04/30/2020   KIDNEY TRANSPLANT  2001   KIDNEY TRANSPLANT  2014   LEEP     TOTAL ABDOMINAL HYSTERECTOMY     ovaries left in situ   Social History   Socioeconomic History   Marital status: Married    Spouse name: Not on file   Number of children: Not on file   Years of education: Not on file   Highest education level: Not on file  Occupational History   Not on file  Tobacco Use   Smoking status: Never   Smokeless tobacco: Never  Vaping Use   Vaping Use: Never used  Substance and Sexual Activity   Alcohol use: Not Currently   Drug use: Never   Sexual activity: Not Currently  Other Topics Concern   Not on file  Social History Narrative   Not on file   Social Determinants of Health   Financial Resource Strain: Not on file  Food Insecurity: Not on file  Transportation Needs: Not on file  Physical  Activity: Not on file  Stress: Not on file  Social Connections: Not on file   Family History  Problem Relation Age of Onset   Cerebral aneurysm Mother    AAA (abdominal aortic aneurysm) Brother    Breast cancer Paternal Aunt    Heart attack Maternal Grandmother    Brain cancer Maternal Grandfather    Colon cancer Neg Hx    Ovarian cancer Neg Hx    Endometrial cancer Neg Hx    Pancreatic cancer Neg Hx    Prostate cancer Neg Hx    - negative except otherwise stated in the family history section Allergies  Allergen Reactions   Oxycodone-Acetaminophen Itching, Nausea Only, Other (See Comments), Nausea And Vomiting and Rash    Other Reaction: HA Other Reaction: HA    Zosyn [Piperacillin Sod-Tazobactam So] Other (See Comments)    Thrombocytopenia   Hydromorphone Nausea And Vomiting, Rash and Other (See Comments)   Amlodipine Other (See Comments)    Ankle swelling   Codeine     vomiting   Prior to Admission medications   Medication Sig Start Date End Date Taking? Authorizing Provider  acetaminophen (TYLENOL) 500 MG tablet Take 2 tablets (1,000 mg total) by mouth 3 (three) times daily. Patient taking differently: Take 1,000 mg by mouth 3 (three) times daily as needed for moderate pain. 05/05/20  Yes Gaylan Gerold, DO  apixaban (ELIQUIS) 2.5 MG TABS tablet TAKE 1 TABLET (2.5 MG TOTAL) BY MOUTH DAILY. Patient taking differently: Take 2.5 mg by mouth daily. 05/17/20 01/19/22 Yes Love, Ivan Anchors, PA-C  carvedilol (COREG) 25 MG tablet Take 25 mg by mouth in the morning and at bedtime. 11/29/21  Yes [provider]  pantoprazole (PROTONIX) 40 MG tablet TAKE 1 TABLET (40 MG TOTAL) BY MOUTH 2 (TWO) TIMES DAILY BEFORE A MEAL. Patient taking differently: Take 40 mg by mouth 2 (two) times daily. 05/17/20 01/19/22 Yes Love, Ivan Anchors, PA-C  predniSONE (DELTASONE) 5 MG tablet Take 5 mg by mouth daily with breakfast.   Yes [provider]  sodium bicarbonate 650 MG tablet Take 1,300 mg  by mouth 3 (three) times daily. 02/07/21  Yes [provider]  tacrolimus (PROGRAF) 1 MG capsule Take 2 mg by mouth in the morning and at bedtime.   Yes [provider]  triamcinolone ointment (KENALOG) 0.5 % Apply 1 Application topically 2 (two) times daily. 12/01/21  Yes Cuartelez Callas, NP   CT Knee Right Wo Contrast  Result Date: 01/19/2022 CLINICAL DATA:  Knee trauma occult fracture suspected EXAM: CT OF THE RIGHT KNEE WITHOUT CONTRAST TECHNIQUE: Multidetector CT imaging of the right knee was performed according to the standard protocol. Multiplanar CT image reconstructions were also generated. RADIATION DOSE REDUCTION: This exam was performed according to the departmental dose-optimization program which includes automated exposure control, adjustment of the mA and/or kV according to patient size and/or use of iterative reconstruction technique. COMPARISON:  Radiographs of the knee FINDINGS: Bones/Joint/Cartilage No definite acute fracture. Large knee joint effusion with fat and hematocrit levels compatible with lipohemarthrosis. Ligaments Suboptimally assessed by CT. Muscles and Tendons Unremarkable CT appearance. Soft tissues Mild subcutaneous edema about the knee anterolaterally. IMPRESSION: No definite acute fracture. Large lipohemarthrosis suspicious for occult fracture. Consider MRI for further evaluation. Electronically Signed   By: Placido Sou M.D.   On: 01/19/2022 20:53   CT HIP RIGHT WO CONTRAST  Result Date: 01/19/2022 CLINICAL DATA:  Fall with right hip pain found to have right femoral fracture on radiograph. EXAM: CT OF THE RIGHT HIP WITHOUT CONTRAST TECHNIQUE: Multidetector CT imaging of the right hip was performed according to the standard protocol. Multiplanar CT image reconstructions were also generated. RADIATION DOSE REDUCTION: This exam was performed according to the departmental dose-optimization program which includes automated exposure control,  adjustment of the mA and/or kV according to patient size and/or use of iterative reconstruction technique. COMPARISON:  Pelvis radiograph dated 01/19/2022, MR pelvis dated 04/22/2021 FINDINGS: Bones/Joint/Cartilage Subcapital right femoral fracture with mild apex anterior angulation. Nonunited pathologic fracture of the lateral right superior pubic ramus is again seen. Lytic lesion of the inferior pubic ramus with cortical disruption underlying the peroneal soft tissue defect. There is heterogeneous appearance of the pubic symphysis. Ligaments Suboptimally assessed by CT. Muscles and Tendons Normal. Soft tissues Partially imaged right lower quadrant transplant kidney. Hydronephrosis. No calculi in the visualized portions. Trace pelvic free fluid. Similar mild presacral stranding or free fluid. Aortic atherosclerosis. Findings related to vulvar carcinoma are better evaluated on prior MRI and nuclear medicine PET. IMPRESSION: 1. Subcapital right femoral fracture with mild apex anterior angulation. 2. Similar Nonunited pathologic fracture of the lateral right superior pubic ramus. 3. Lytic lesion of the inferior pubic ramus with cortical disruption underlying the peroneal soft tissue defect, better evaluated on prior. 4. Findings related to vulvar carcinoma are better evaluated on prior MRI and nuclear medicine PET.  5. Aortic Atherosclerosis (ICD10-I70.0). Electronically Signed   By: Darrin Nipper M.D.   On: 01/19/2022 19:46   DG Hip Unilat W or Wo Pelvis 2-3 Views Right  Result Date: 01/19/2022 CLINICAL DATA:  Trauma, fall EXAM: DG HIP (WITH OR WITHOUT PELVIS) 2-3V RIGHT COMPARISON:  None Available. FINDINGS: There is minimal angulation in the lateral aspect of subcapital portion of neck of right femur. There is faint sclerosis in the subcapital portion of neck of right femur. There is lytic process in the right inferior pubic ramus. There is possible healing fracture in the lateral aspect of right superior pubic  ramus. Arterial calcifications are seen in soft tissues. Surgical clips are seen in pelvis. IMPRESSION: There is mild cortical angulation in the lateral aspect of subcapital portion of neck of right femur suggesting possible recent fracture. Fracture line is difficult to visualize. Follow-up CT may be considered for further evaluation. There is healing fracture in the lateral aspect of right superior pubic ramus. There is a lytic lesion in the right inferior pubic ramus which may be due to neoplastic or infectious process. Electronically Signed   By: Elmer Picker M.D.   On: 01/19/2022 13:32   DG Chest 2 View  Result Date: 01/19/2022 CLINICAL DATA:  Trauma, fall EXAM: CHEST - 2 VIEW COMPARISON:  04/20/2020 FINDINGS: Transverse diameter of heart is increased. There are no signs of pulmonary edema or focal pulmonary consolidation. There is no pleural effusion or pneumothorax. IMPRESSION: Cardiomegaly. There are no signs of pulmonary edema or focal pulmonary consolidation. Electronically Signed   By: Elmer Picker M.D.   On: 01/19/2022 13:26   DG Knee 2 Views Right  Result Date: 01/19/2022 CLINICAL DATA:  Fall.  Right knee pain and swelling. EXAM: RIGHT KNEE - 1-2 VIEW COMPARISON:  Right knee radiographs 12/12/2021 FINDINGS: There is again mildly decreased bone mineralization. There is a new moderate-to-large joint effusion. There is some fat density within the nondependent aspect of the joint fluid, which may represent normal fat in this region just deep to the distal quadriceps tendon versus a layering fat-fluid level which would be suspicious for a lipohemarthrosis/radiographically occult fracture. No definite acute fracture is visualized. No dislocation. IMPRESSION: New moderate-to-large joint effusion. There is some fat density within the nondependent aspect of the joint fluid, which may represent normal fat in this region versus a layering fat-fluid level which would be suspicious for a  lipohemarthrosis/radiographically occult fracture. Recommend clinical correlation. Electronically Signed   By: Yvonne Kendall M.D.   On: 01/19/2022 13:26   - pertinent xrays, CT, MRI studies were reviewed and independently interpreted  Positive ROS: All other systems have been reviewed and were otherwise negative with the exception of those mentioned in the HPI and as above.  Physical Exam: General: Alert, no acute distress Psychiatric: Patient is competent for consent with normal mood and affect Lymphatic: No axillary or cervical lymphadenopathy Cardiovascular: No pedal edema Respiratory: No cyanosis, no use of accessory musculature GI: No organomegaly, abdomen is soft and non-tender    Images:  '@ENCIMAGES'$ @  Labs:  Lab Results  Component Value Date   ESRSEDRATE 120 (H) 06/08/2020   ESRSEDRATE 70 (H) 05/05/2020   ESRSEDRATE 73 (H) 04/27/2020   CRP 13.3 (H) 06/08/2020   CRP 4.9 (H) 05/13/2020   CRP 4.4 (H) 05/06/2020   REPTSTATUS 05/10/2020 FINAL 04/30/2020   GRAMSTAIN NO WBC SEEN NO ORGANISMS SEEN  04/30/2020   CULT  04/30/2020    RARE ENTEROCOCCUS FAECIUM VANCOMYCIN RESISTANT ENTEROCOCCUS ISOLATED  NO ANAEROBES ISOLATED SEE SEPARATE REPORT FOR DAPTOMYCIN RESULT Performed at [employer] Performed at [facility], [place] [address] [place],  [address]    LABORGA ENTEROCOCCUS FAECIUM 04/30/2020    Lab Results  Component Value Date   ALBUMIN 1.9 (L) 05/06/2020   ALBUMIN 2.0 (L) 04/24/2020   ALBUMIN 1.9 (L) 04/23/2020        Latest Ref Rng & Units 01/20/2022    5:11 AM 01/19/2022   11:32 PM 01/19/2022   12:51 PM  CBC EXTENDED  WBC 4.0 - 10.5 K/uL 3.9   2.9   RBC 3.87 - 5.11 MIL/uL 2.28   2.05   Hemoglobin 12.0 - 15.0 g/dL 7.0  7.0  6.4   HCT 36.0 - 46.0 % 21.4  21.8  19.8   Platelets 150 - 400 K/uL 103   111   NEUT# 1.7 - 7.7 K/uL   0.9   Lymph# 0.7 - 4.0 K/uL   1.6     Neurologic: Patient does not have protective sensation bilateral  lower extremities.   MUSCULOSKELETAL:   Skin: Examination patient has multiple abrasions with an effusion of the right knee.  There is effusion of the prepatellar bursa.  She has an intra-articular effusion of the right kn  Review of the CT scan does not show fractures the quad and patella tendon are intact.  Patient has pain with range of motion of the right hip.  Radiographs shows essentially nondisplaced fracture review of the CT scan shows a subcapital femoral neck fracture.  Review of the CT scan shows nonunion of a pubic rami fracture.  Patient's laboratory values show failure of the transplant kidney with a white cell count of 3.9.  Her hemoglobin is 7.0 after transfusion of 1 packed unit of red blood cells.  Her platelet count is 103.  Albumin 1.9.  Abdominal cultures last year showed Enterococcus faecalis.  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  Patient is receiving her second unit of packed red blood cells now.  Plan: Plan: I have discussed the case with Dr. Ninfa Linden and Dr. Marlou Sa.  Dr. Marlou Sa will follow-up and evaluate for surgical intervention for the right hip subcapital femoral neck fracture.  Surgical intervention would include either cannulated screw fixation of the femoral neck fracture or total hip arthroplasty.   Thank you for the consult and the opportunity to see Ms. Carl Best, MD [employer] [phone] 1:32 PM

## 2022-01-20 NOTE — ED Notes (Signed)
Pt chart reviewed including CT and xray results and MD contacted regarding activity order.  Also regarding orthopedic consult.

## 2022-01-21 DIAGNOSIS — D649 Anemia, unspecified: Secondary | ICD-10-CM | POA: Diagnosis not present

## 2022-01-21 LAB — BASIC METABOLIC PANEL
Anion gap: 15 (ref 5–15)
BUN: 76 mg/dL — ABNORMAL HIGH (ref 6–20)
CO2: 17 mmol/L — ABNORMAL LOW (ref 22–32)
Calcium: 8.9 mg/dL (ref 8.9–10.3)
Chloride: 105 mmol/L (ref 98–111)
Creatinine, Ser: 6.74 mg/dL — ABNORMAL HIGH (ref 0.44–1.00)
GFR, Estimated: 7 mL/min — ABNORMAL LOW (ref >60)
Glucose, Bld: 111 mg/dL — ABNORMAL HIGH (ref 70–99)
Potassium: 4.3 mmol/L (ref 3.5–5.1)
Sodium: 137 mmol/L (ref 135–145)

## 2022-01-21 LAB — TYPE AND SCREEN
ABO/RH(D): O POS
Antibody Screen: NEGATIVE
Unit division: 0
Unit division: 0

## 2022-01-21 LAB — SURGICAL PCR SCREEN
MRSA, PCR: NEGATIVE
Staphylococcus aureus: NEGATIVE

## 2022-01-21 LAB — BPAM RBC
Blood Product Expiration Date: 202311182359
Blood Product Expiration Date: 202311192359
ISSUE DATE / TIME: 202310191605
ISSUE DATE / TIME: 202310201225
Unit Type and Rh: 5100
Unit Type and Rh: 5100

## 2022-01-21 LAB — CBC WITH DIFFERENTIAL/PLATELET
Abs Immature Granulocytes: 0.18 10*3/uL — ABNORMAL HIGH (ref 0.00–0.07)
Basophils Absolute: 0 10*3/uL (ref 0.0–0.1)
Basophils Relative: 1 %
Eosinophils Absolute: 0.1 10*3/uL (ref 0.0–0.5)
Eosinophils Relative: 2 %
HCT: 27.5 % — ABNORMAL LOW (ref 36.0–46.0)
Hemoglobin: 9.1 g/dL — ABNORMAL LOW (ref 12.0–15.0)
Immature Granulocytes: 5 %
Lymphocytes Relative: 47 %
Lymphs Abs: 1.9 10*3/uL (ref 0.7–4.0)
MCH: 30.1 pg (ref 26.0–34.0)
MCHC: 33.1 g/dL (ref 30.0–36.0)
MCV: 91.1 fL (ref 80.0–100.0)
Monocytes Absolute: 0.4 10*3/uL (ref 0.1–1.0)
Monocytes Relative: 10 %
Neutro Abs: 1.4 10*3/uL — ABNORMAL LOW (ref 1.7–7.7)
Neutrophils Relative %: 35 %
Platelets: 113 10*3/uL — ABNORMAL LOW (ref 150–400)
RBC: 3.02 MIL/uL — ABNORMAL LOW (ref 3.87–5.11)
RDW: 17.2 % — ABNORMAL HIGH (ref 11.5–15.5)
WBC: 3.9 10*3/uL — ABNORMAL LOW (ref 4.0–10.5)
nRBC: 0 % (ref 0.0–0.2)

## 2022-01-21 LAB — PATHOLOGIST SMEAR REVIEW: Path Review: NEGATIVE

## 2022-01-21 LAB — VITAMIN D 25 HYDROXY (VIT D DEFICIENCY, FRACTURES): Vit D, 25-Hydroxy: 25.04 ng/mL — ABNORMAL LOW (ref 30–100)

## 2022-01-21 MED ORDER — TRANEXAMIC ACID-NACL 1000-0.7 MG/100ML-% IV SOLN
1000.0000 mg | INTRAVENOUS | Status: AC
  Administered 2022-01-22: 1000 mg via INTRAVENOUS
  Filled 2022-01-21 (×2): qty 100

## 2022-01-21 MED ORDER — TRANEXAMIC ACID 1000 MG/10ML IV SOLN
2000.0000 mg | INTRAVENOUS | Status: DC
  Filled 2022-01-21 (×2): qty 20

## 2022-01-21 MED ORDER — POVIDONE-IODINE 10 % EX SWAB
2.0000 | Freq: Once | CUTANEOUS | Status: AC
  Administered 2022-01-22: 2 via TOPICAL

## 2022-01-21 MED ORDER — CEFAZOLIN SODIUM-DEXTROSE 2-4 GM/100ML-% IV SOLN
2.0000 g | INTRAVENOUS | Status: DC
  Filled 2022-01-21: qty 100

## 2022-01-21 NOTE — Progress Notes (Signed)
PROGRESS NOTE  Christina Mack  DOB: June 03, 1965  PCP: System, Provider Not In ZOX:[id number]  DOA: 01/19/2022  LOS: 2 days  Hospital Day: 3  Brief narrative: Christina Mack is a 56 y.o. 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   10/19, patient presented with mechanical fall leading to right knee swelling. She was walking her dog was became uncontrolled and dragged the leash.  Patient suddenly lost her balance and fell on her right side landing on her right hip and knee.  She had severe pain in the right knee and hip, was not able to get up.   Her last dose of Eliquis was on the evening of 10/18.  In the ED, patient was afebrile, blood pressure elevated to 150s to 170s, breathing on room air. Labs with WC count 2.9, hemoglobin low at 6.4, platelet low at 111, BUN/creatinine elevated to 82/6.79  CT scans of the right knee and right hip were done with findings as below:  1. Subcapital right femoral fracture with mild apex anterior angulation. 2. Similar Nonunited pathologic fracture of the lateral right superior pubic ramus. 3. Lytic lesion of the inferior pubic ramus with cortical disruption underlying the peroneal soft tissue defect,  4. No definite acute fracture of the right knee area. Large lipohemarthrosis suspicious for occult fracture. Consider MRI for further evaluation.  MRI right knee showed  1. Nondisplaced fracture of the lateral tibial plateau. 2. Subtle marrow edema of the lateral aspect of the lateral femoral condyle suggesting bone contusion. 3. Large knee joint effusion with lipohemarthrosis.  Admitted to [facility] consulted  Subjective: Patient was seen and examined this morning.  Lying down on  bed.  Not in distress.  Pain controlled. Family at bedside.  Assessment and plan: Acute subcapital right femur fracture Acute nondisplaced fracture of the lateral tibial plateau Presented after mechanical fall leading to right hip pain, right knee pain and gross swelling in the area CT scan and MRI findings as above Orthopedics consult appreciated.  Pending ORIF, likely tomorrow. Eliquis is on hold.  N.p.o. after midnight. For pain control, patient is currently on as needed IV morphine and as needed oral Norco.  Right pubic fracture CT scan noted nonunited pathological fracture of the right superior pubic ramus. It also showed likely lesion of the inferior pubic ramus with cortical disruption underlying the perineal soft tissue defect.  It is probably related to patient's pre-existing vulvar cancer. Orthopedics to comment.  Acute blood loss anemia secondary to large hemarthrosis of the right knee after the fall Last dose of Eliquis was on the night of 10/18. Currently Eliquis is on hold. Initial hemoglobin low at 6.4.  So far patient received 2 units of PRBCs.  Hemoglobin at 9.1 this morning.  Continue to monitor. Recent Labs    01/19/22 1251 01/19/22 2332 01/20/22 0511 01/21/22 0405  HGB 6.4* 7.0* 7.0* 9.1*  MCV 96.6  --  93.9 91.1   s/p renal transplant with chronic rejection  CKD stage IV  Baseline creatinine over 6. Per H&P, worsening of creatinine level started July 2023, she went to see her nephrology at [facility] 2 weeks ago, when she was diagnosed with chronic rejection.  And was instructed to increase tacrolimus but later decreased to 2 mg twice daily  due to worsening of kidney function.  As per [facility] nephrology's note recently,  tacrolimus level should be in the narrow range of 5-7.   Tacrolimus level sent.  Pending results. Continue prednisone Recent Labs    01/19/22 1251 01/20/22 0511 01/21/22 0405  BUN 82* 76* 76*  CREATININE 6.79* 6.57* 6.74*   History of vulvar  cancer Completed chemo and radiation therapy, PET scan stable recently, outpatient followup with oncology.  History of chronic pelvic ulcer and osteomyelitis Secondary to radiation therapy, she completed Zyvox treatment in September and last session of hyperbaric chamber treatment last week.  She reported much improved wound locally, and she is scheduled to see wound care center next Tuesday.   HTN Continue Coreg Add Hydralazine, aiming at BP goal of 130/80   Chronic DVT She had DVT in right thigh in 2021 during chemo and radiation.  She is on Eliquis since then.   Currently on hold preoperatively. SCDs for now  Goals of care   Code Status: Full Code    Mobility: Needs PT eval postprocedure  Skin assessment:     Nutritional status:  Body mass index is 25.31 kg/m.          Diet:  Diet Order             Diet NPO time specified  Diet effective midnight           Diet regular Room service appropriate? Yes; Fluid consistency: Thin  Diet effective now                   DVT prophylaxis:  Place and maintain sequential compression device Start: 01/21/22 0736 SCDs Start: 01/19/22 1616   Antimicrobials: None Fluid: None Consultants: Orthopedics Family Communication: None at bedside  Status is: Inpatient  Continue inhospital care because: Pending hip fracture surgery Level of care: Med-Surg   Dispo: The patient is from: Home              Anticipated d/c is to: Pending clinical course              Patient currently is not medically stable to d/c.   Difficult to place patient No     Infusions:    Scheduled Meds:  carvedilol  25 mg Oral BID WC   pantoprazole  40 mg Oral BID   predniSONE  5 mg Oral Q breakfast   sodium bicarbonate  1,300 mg Oral TID   tacrolimus  2 mg Oral BID    PRN meds: acetaminophen, bisacodyl, fentaNYL (SUBLIMAZE) injection, hydrALAZINE, HYDROcodone-acetaminophen, ondansetron **OR** ondansetron (ZOFRAN) IV, senna-docusate    Antimicrobials: Anti-infectives (From admission, onward)    None       Objective: Vitals:   01/21/22 0558 01/21/22 0817  BP: 133/87 (!) 150/89  Pulse: 90 91  Resp: 18 13  Temp: 99.1 F (37.3 C) 98.6 F (37 C)  SpO2: 95%     Intake/Output Summary (Last 24 hours) at 01/21/2022 1304 Last data filed at 01/21/2022 1300 Gross per 24 hour  Intake 1452.92 ml  Output 400 ml  Net 1052.92 ml   Filed Weights   01/19/22 1146  Weight: 69 kg   Weight change:  Body mass index is 25.31 kg/m.   Physical Exam: General exam: Pleasant middle-aged Caucasian female.  Pain controlled at this time Skin: No rashes, lesions or ulcers. HEENT: Atraumatic, normocephalic, no obvious bleeding Lungs: Clear to auscultation bilaterally CVS: Regular rate and rhythm, no murmur GI/Abd: Nontender, nondistended, bowel sound present  CNS: Alert, awake, oriented x3 Psychiatry: Sad affect Extremities: Right knee, thigh and knee.  Grossly swollen.  No external injury noted.  Data Review: I have personally reviewed the laboratory data and studies available.  F/u labs ordered Unresulted Labs (From admission, onward)     Start     Ordered   01/21/22 0855  VITAMIN D 25 Hydroxy (Vit-D Deficiency, Fractures)  ONCE - URGENT,   URGENT        01/21/22 0854   01/19/22 1517  Tacrolimus level  Add-on,   AD        01/19/22 1516            Signed, Terrilee Croak, MD [facility] 01/21/2022

## 2022-01-22 ENCOUNTER — Encounter (HOSPITAL_COMMUNITY): Payer: Self-pay | Admitting: Internal Medicine

## 2022-01-22 ENCOUNTER — Inpatient Hospital Stay (HOSPITAL_COMMUNITY)

## 2022-01-22 ENCOUNTER — Other Ambulatory Visit: Payer: Self-pay

## 2022-01-22 ENCOUNTER — Inpatient Hospital Stay (HOSPITAL_COMMUNITY): Admitting: Certified Registered"

## 2022-01-22 ENCOUNTER — Encounter (HOSPITAL_COMMUNITY)
Admission: EM | Disposition: A | Discharge status: Home Health | Payer: Self-pay | Source: Home / Self Care | Attending: Internal Medicine

## 2022-01-22 DIAGNOSIS — M9420 Chondromalacia, unspecified site: Secondary | ICD-10-CM

## 2022-01-22 DIAGNOSIS — S82141A Displaced bicondylar fracture of right tibia, initial encounter for closed fracture: Secondary | ICD-10-CM

## 2022-01-22 DIAGNOSIS — S72001A Fracture of unspecified part of neck of right femur, initial encounter for closed fracture: Secondary | ICD-10-CM | POA: Diagnosis not present

## 2022-01-22 DIAGNOSIS — S82124A Nondisplaced fracture of lateral condyle of right tibia, initial encounter for closed fracture: Secondary | ICD-10-CM

## 2022-01-22 DIAGNOSIS — S72041A Displaced fracture of base of neck of right femur, initial encounter for closed fracture: Secondary | ICD-10-CM

## 2022-01-22 DIAGNOSIS — D649 Anemia, unspecified: Secondary | ICD-10-CM | POA: Diagnosis not present

## 2022-01-22 DIAGNOSIS — I10 Essential (primary) hypertension: Secondary | ICD-10-CM

## 2022-01-22 HISTORY — PX: ORIF TIBIA PLATEAU: SHX2132

## 2022-01-22 HISTORY — PX: APPLICATION OF WOUND VAC: SHX5189

## 2022-01-22 HISTORY — PX: TOTAL HIP ARTHROPLASTY: SHX124

## 2022-01-22 SURGERY — ARTHROPLASTY, HIP, TOTAL, ANTERIOR APPROACH
Anesthesia: General | Site: Hip | Laterality: Right

## 2022-01-22 MED ORDER — MIDAZOLAM HCL 2 MG/2ML IJ SOLN
INTRAMUSCULAR | Status: AC
  Filled 2022-01-22: qty 2

## 2022-01-22 MED ORDER — TRANEXAMIC ACID-NACL 1000-0.7 MG/100ML-% IV SOLN
INTRAVENOUS | Status: AC
  Filled 2022-01-22: qty 100

## 2022-01-22 MED ORDER — MENTHOL 3 MG MT LOZG: 1.0000 | LOZENGE | OROMUCOSAL | Status: DC | PRN

## 2022-01-22 MED ORDER — ACETAMINOPHEN 500 MG PO TABS
500.0000 mg | ORAL_TABLET | Freq: Four times a day (QID) | ORAL | Status: AC
  Administered 2022-01-22 – 2022-01-23 (×4): 500 mg via ORAL
  Filled 2022-01-22 (×4): qty 1

## 2022-01-22 MED ORDER — PROPOFOL 10 MG/ML IV BOLUS
INTRAVENOUS | Status: DC | PRN
  Administered 2022-01-22: 50 mg via INTRAVENOUS
  Administered 2022-01-22: 60 mg via INTRAVENOUS

## 2022-01-22 MED ORDER — CHLORHEXIDINE GLUCONATE 4 % EX LIQD: 60.0000 mL | Freq: Once | CUTANEOUS | Status: DC

## 2022-01-22 MED ORDER — MIDAZOLAM HCL 2 MG/2ML IJ SOLN
INTRAMUSCULAR | Status: DC | PRN
  Administered 2022-01-22: 2 mg via INTRAVENOUS

## 2022-01-22 MED ORDER — VANCOMYCIN HCL 1 G IV SOLR
INTRAVENOUS | Status: DC | PRN
  Administered 2022-01-22 (×2): 1000 mg via TOPICAL

## 2022-01-22 MED ORDER — PHENOL 1.4 % MT LIQD: 1.0000 | OROMUCOSAL | Status: DC | PRN

## 2022-01-22 MED ORDER — SORBITOL 70 % SOLN: 30.0000 mL | Freq: Every day | Status: DC | PRN

## 2022-01-22 MED ORDER — TRANEXAMIC ACID-NACL 1000-0.7 MG/100ML-% IV SOLN
1000.0000 mg | Freq: Once | INTRAVENOUS | Status: AC
  Administered 2022-01-22: 1000 mg via INTRAVENOUS
  Filled 2022-01-22: qty 100

## 2022-01-22 MED ORDER — PHENYLEPHRINE 80 MCG/ML (10ML) SYRINGE FOR IV PUSH (FOR BLOOD PRESSURE SUPPORT)
PREFILLED_SYRINGE | INTRAVENOUS | Status: DC | PRN
  Administered 2022-01-22 (×5): 40 ug via INTRAVENOUS

## 2022-01-22 MED ORDER — PHENYLEPHRINE HCL-NACL 20-0.9 MG/250ML-% IV SOLN
INTRAVENOUS | Status: DC | PRN
  Administered 2022-01-22: 20 ug/min via INTRAVENOUS

## 2022-01-22 MED ORDER — HYDROCODONE-ACETAMINOPHEN 5-325 MG PO TABS: 1.0000 | ORAL_TABLET | ORAL | Status: DC | PRN

## 2022-01-22 MED ORDER — APIXABAN 5 MG PO TABS: 5.0000 mg | ORAL_TABLET | Freq: Two times a day (BID) | ORAL | Status: DC

## 2022-01-22 MED ORDER — LACTATED RINGERS IV SOLN: INTRAVENOUS | Status: DC | PRN

## 2022-01-22 MED ORDER — POVIDONE-IODINE 10 % EX SWAB: 2.0000 | Freq: Once | CUTANEOUS | Status: DC

## 2022-01-22 MED ORDER — FENTANYL CITRATE (PF) 100 MCG/2ML IJ SOLN
INTRAMUSCULAR | Status: AC
  Administered 2022-01-22: 50 ug
  Filled 2022-01-22: qty 2

## 2022-01-22 MED ORDER — CLONIDINE HCL (ANALGESIA) 100 MCG/ML EP SOLN
EPIDURAL | Status: DC | PRN
  Administered 2022-01-22: 100 ug

## 2022-01-22 MED ORDER — FENTANYL CITRATE (PF) 250 MCG/5ML IJ SOLN
INTRAMUSCULAR | Status: AC
  Filled 2022-01-22: qty 5

## 2022-01-22 MED ORDER — ORAL CARE MOUTH RINSE: 15.0000 mL | Freq: Once | OROMUCOSAL | Status: AC

## 2022-01-22 MED ORDER — ACETAMINOPHEN 10 MG/ML IV SOLN
INTRAVENOUS | Status: AC
  Filled 2022-01-22: qty 100

## 2022-01-22 MED ORDER — BUPIVACAINE HCL (PF) 0.25 % IJ SOLN
INTRAMUSCULAR | Status: AC
  Filled 2022-01-22: qty 30

## 2022-01-22 MED ORDER — MAGNESIUM CITRATE PO SOLN: 1.0000 | Freq: Once | ORAL | Status: DC | PRN

## 2022-01-22 MED ORDER — ACETAMINOPHEN 10 MG/ML IV SOLN
INTRAVENOUS | Status: DC | PRN
  Administered 2022-01-22: 1000 mg via INTRAVENOUS

## 2022-01-22 MED ORDER — LIDOCAINE 2% (20 MG/ML) 5 ML SYRINGE
INTRAMUSCULAR | Status: AC
  Filled 2022-01-22: qty 5

## 2022-01-22 MED ORDER — APIXABAN 2.5 MG PO TABS
2.5000 mg | ORAL_TABLET | Freq: Every day | ORAL | Status: DC
  Administered 2022-01-22: 2.5 mg via ORAL
  Filled 2022-01-22: qty 1

## 2022-01-22 MED ORDER — ROCURONIUM BROMIDE 10 MG/ML (PF) SYRINGE
PREFILLED_SYRINGE | INTRAVENOUS | Status: DC | PRN
  Administered 2022-01-22: 50 mg via INTRAVENOUS
  Administered 2022-01-22 (×4): 20 mg via INTRAVENOUS

## 2022-01-22 MED ORDER — METHOCARBAMOL 500 MG PO TABS
500.0000 mg | ORAL_TABLET | Freq: Four times a day (QID) | ORAL | Status: DC | PRN
  Administered 2022-01-22 – 2022-01-24 (×4): 500 mg via ORAL
  Filled 2022-01-22 (×4): qty 1

## 2022-01-22 MED ORDER — BUPIVACAINE HCL (PF) 0.25 % IJ SOLN
INTRAMUSCULAR | Status: DC | PRN
  Administered 2022-01-22: 30 mL

## 2022-01-22 MED ORDER — BUPIVACAINE-MELOXICAM ER 400-12 MG/14ML IJ SOLN
INTRAMUSCULAR | Status: DC | PRN
  Administered 2022-01-22: 400 mg

## 2022-01-22 MED ORDER — VANCOMYCIN HCL 1000 MG IV SOLR
INTRAVENOUS | Status: AC
  Filled 2022-01-22: qty 20

## 2022-01-22 MED ORDER — ACETAMINOPHEN 325 MG PO TABS
325.0000 mg | ORAL_TABLET | Freq: Four times a day (QID) | ORAL | Status: DC | PRN
  Administered 2022-01-24: 650 mg via ORAL
  Administered 2022-01-24: 325 mg via ORAL
  Administered 2022-01-25 (×2): 650 mg via ORAL
  Filled 2022-01-22: qty 1
  Filled 2022-01-22 (×3): qty 2

## 2022-01-22 MED ORDER — CEFAZOLIN SODIUM-DEXTROSE 2-4 GM/100ML-% IV SOLN
2.0000 g | Freq: Four times a day (QID) | INTRAVENOUS | Status: AC
  Administered 2022-01-22 – 2022-01-23 (×3): 2 g via INTRAVENOUS
  Filled 2022-01-22 (×3): qty 100

## 2022-01-22 MED ORDER — FENTANYL CITRATE (PF) 100 MCG/2ML IJ SOLN
25.0000 ug | INTRAMUSCULAR | Status: DC | PRN
  Administered 2022-01-22: 50 ug via INTRAVENOUS

## 2022-01-22 MED ORDER — DOCUSATE SODIUM 100 MG PO CAPS
100.0000 mg | ORAL_CAPSULE | Freq: Two times a day (BID) | ORAL | Status: DC
  Administered 2022-01-22 – 2022-01-25 (×7): 100 mg via ORAL
  Filled 2022-01-22 (×7): qty 1

## 2022-01-22 MED ORDER — TRANEXAMIC ACID 1000 MG/10ML IV SOLN
INTRAVENOUS | Status: DC | PRN
  Administered 2022-01-22: 2000 mg via TOPICAL

## 2022-01-22 MED ORDER — POLYETHYLENE GLYCOL 3350 17 G PO PACK: 17.0000 g | PACK | Freq: Every day | ORAL | Status: DC | PRN

## 2022-01-22 MED ORDER — SUGAMMADEX SODIUM 200 MG/2ML IV SOLN
INTRAVENOUS | Status: DC | PRN
  Administered 2022-01-22: 150 mg via INTRAVENOUS

## 2022-01-22 MED ORDER — ONDANSETRON HCL 4 MG/2ML IJ SOLN: 4.0000 mg | Freq: Four times a day (QID) | INTRAMUSCULAR | Status: DC | PRN

## 2022-01-22 MED ORDER — MORPHINE SULFATE (PF) 4 MG/ML IV SOLN
INTRAVENOUS | Status: AC
  Filled 2022-01-22: qty 2

## 2022-01-22 MED ORDER — ROCURONIUM BROMIDE 10 MG/ML (PF) SYRINGE
PREFILLED_SYRINGE | INTRAVENOUS | Status: AC
  Filled 2022-01-22: qty 10

## 2022-01-22 MED ORDER — HYDROCODONE-ACETAMINOPHEN 7.5-325 MG PO TABS: 1.0000 | ORAL_TABLET | Freq: Three times a day (TID) | ORAL | 0 refills | Status: DC | PRN

## 2022-01-22 MED ORDER — SUCCINYLCHOLINE CHLORIDE 200 MG/10ML IV SOSY
PREFILLED_SYRINGE | INTRAVENOUS | Status: AC
  Filled 2022-01-22: qty 10

## 2022-01-22 MED ORDER — SODIUM CHLORIDE 0.9 % IV SOLN: INTRAVENOUS | Status: DC

## 2022-01-22 MED ORDER — CEFAZOLIN SODIUM-DEXTROSE 2-4 GM/100ML-% IV SOLN
2.0000 g | INTRAVENOUS | Status: AC
  Administered 2022-01-22: 2 g via INTRAVENOUS

## 2022-01-22 MED ORDER — PHENYLEPHRINE 80 MCG/ML (10ML) SYRINGE FOR IV PUSH (FOR BLOOD PRESSURE SUPPORT)
PREFILLED_SYRINGE | INTRAVENOUS | Status: AC
  Filled 2022-01-22: qty 10

## 2022-01-22 MED ORDER — MORPHINE SULFATE (PF) 4 MG/ML IV SOLN
INTRAVENOUS | Status: DC | PRN
  Administered 2022-01-22: 8 mg

## 2022-01-22 MED ORDER — ALUM & MAG HYDROXIDE-SIMETH 200-200-20 MG/5ML PO SUSP: 30.0000 mL | ORAL | Status: DC | PRN

## 2022-01-22 MED ORDER — LIDOCAINE 2% (20 MG/ML) 5 ML SYRINGE
INTRAMUSCULAR | Status: DC | PRN
  Administered 2022-01-22: 80 mg via INTRAVENOUS

## 2022-01-22 MED ORDER — CHLORHEXIDINE GLUCONATE 0.12 % MT SOLN
OROMUCOSAL | Status: AC
  Administered 2022-01-22: 15 mL via OROMUCOSAL
  Filled 2022-01-22: qty 15

## 2022-01-22 MED ORDER — 0.9 % SODIUM CHLORIDE (POUR BTL) OPTIME
TOPICAL | Status: DC | PRN
  Administered 2022-01-22 (×4): 1000 mL

## 2022-01-22 MED ORDER — ONDANSETRON HCL 4 MG PO TABS: 4.0000 mg | ORAL_TABLET | Freq: Four times a day (QID) | ORAL | Status: DC | PRN

## 2022-01-22 MED ORDER — MORPHINE SULFATE (PF) 2 MG/ML IV SOLN: 0.5000 mg | INTRAVENOUS | Status: DC | PRN

## 2022-01-22 MED ORDER — CLONIDINE HCL (ANALGESIA) 100 MCG/ML EP SOLN
EPIDURAL | Status: AC
  Filled 2022-01-22: qty 10

## 2022-01-22 MED ORDER — DEXAMETHASONE SODIUM PHOSPHATE 10 MG/ML IJ SOLN
INTRAMUSCULAR | Status: DC | PRN
  Administered 2022-01-22: 4 mg via INTRAVENOUS

## 2022-01-22 MED ORDER — ONDANSETRON HCL 4 MG/2ML IJ SOLN
INTRAMUSCULAR | Status: DC | PRN
  Administered 2022-01-22: 4 mg via INTRAVENOUS

## 2022-01-22 MED ORDER — DEXAMETHASONE SODIUM PHOSPHATE 10 MG/ML IJ SOLN
INTRAMUSCULAR | Status: AC
  Filled 2022-01-22: qty 1

## 2022-01-22 MED ORDER — SODIUM CHLORIDE 0.9 % IR SOLN
Status: DC | PRN
  Administered 2022-01-22: 1800 mL

## 2022-01-22 MED ORDER — FENTANYL CITRATE (PF) 250 MCG/5ML IJ SOLN
INTRAMUSCULAR | Status: DC | PRN
  Administered 2022-01-22 (×3): 50 ug via INTRAVENOUS
  Administered 2022-01-22: 100 ug via INTRAVENOUS

## 2022-01-22 MED ORDER — CHLORHEXIDINE GLUCONATE 0.12 % MT SOLN: 15.0000 mL | Freq: Once | OROMUCOSAL | Status: AC

## 2022-01-22 MED ORDER — HYDROCODONE-ACETAMINOPHEN 7.5-325 MG PO TABS: 1.0000 | ORAL_TABLET | ORAL | Status: DC | PRN

## 2022-01-22 MED ORDER — BUPIVACAINE-MELOXICAM ER 400-12 MG/14ML IJ SOLN
INTRAMUSCULAR | Status: AC
  Filled 2022-01-22: qty 1

## 2022-01-22 MED ORDER — ONDANSETRON HCL 4 MG/2ML IJ SOLN
INTRAMUSCULAR | Status: AC
  Filled 2022-01-22: qty 2

## 2022-01-22 MED ORDER — PROPOFOL 10 MG/ML IV BOLUS
INTRAVENOUS | Status: AC
  Filled 2022-01-22: qty 20

## 2022-01-22 MED ORDER — BUPIVACAINE HCL (PF) 0.5 % IJ SOLN
INTRAMUSCULAR | Status: AC
  Filled 2022-01-22: qty 30

## 2022-01-22 MED ORDER — LACTATED RINGERS IV SOLN: INTRAVENOUS | Status: DC

## 2022-01-22 MED ORDER — PRONTOSAN WOUND IRRIGATION OPTIME
TOPICAL | Status: DC | PRN
  Administered 2022-01-22: 1 via TOPICAL

## 2022-01-22 MED ORDER — METHOCARBAMOL 1000 MG/10ML IJ SOLN: 500.0000 mg | Freq: Four times a day (QID) | INTRAVENOUS | Status: DC | PRN

## 2022-01-22 SURGICAL SUPPLY — 102 items
BAG COUNTER SPONGE SURGICOUNT (BAG) ×4 IMPLANT
BENZOIN TINCTURE PRP APPL 2/3 (GAUZE/BANDAGES/DRESSINGS) IMPLANT
BIT DRILL CAL (BIT) IMPLANT
BIT DRILL CALIBRATED 2.7 (BIT) IMPLANT
BLADE SURG 10 STRL SS (BLADE) ×2 IMPLANT
BLADE SURG 15 STRL LF DISP TIS (BLADE) ×2 IMPLANT
BLADE SURG 15 STRL SS (BLADE) ×4
BNDG ELASTIC 6X10 VLCR STRL LF (GAUZE/BANDAGES/DRESSINGS) IMPLANT
CANISTER WOUND CARE 500ML ATS (WOUND CARE) IMPLANT
CLEANER TIP ELECTROSURG 2X2 (MISCELLANEOUS) ×2 IMPLANT
CONNECTOR Y ATS VAC SYSTEM (MISCELLANEOUS) IMPLANT
COVER PERINEAL POST (MISCELLANEOUS) ×2 IMPLANT
COVER SURGICAL LIGHT HANDLE (MISCELLANEOUS) ×2 IMPLANT
CUP SECTOR GRIPTON 50MM (Cup) IMPLANT
DRAPE C-ARM 42X72 X-RAY (DRAPES) ×2 IMPLANT
DRAPE C-ARMOR (DRAPES) IMPLANT
DRAPE IMP U-DRAPE 54X76 (DRAPES) IMPLANT
DRAPE INCISE IOBAN 66X45 STRL (DRAPES) ×2 IMPLANT
DRAPE ORTHO SPLIT 77X108 STRL (DRAPES) ×4
DRAPE POUCH INSTRU U-SHP 10X18 (DRAPES) ×2 IMPLANT
DRAPE STERI IOBAN 125X83 (DRAPES) ×2 IMPLANT
DRAPE SURG ORHT 6 SPLT 77X108 (DRAPES) IMPLANT
DRAPE U-SHAPE 47X51 STRL (DRAPES) ×4 IMPLANT
DRESSING PEEL AND PLC PRVNA 13 (GAUZE/BANDAGES/DRESSINGS) IMPLANT
DRILL BIT CAL (BIT) ×2
DRSG PEEL AND PLACE PREVENA 13 (GAUZE/BANDAGES/DRESSINGS) ×4
DURAPREP 26ML APPLICATOR (WOUND CARE) ×4 IMPLANT
ELECT BLADE 4.0 EZ CLEAN MEGAD (MISCELLANEOUS) ×2
ELECT REM PT RETURN 9FT ADLT (ELECTROSURGICAL) ×2
ELECTRODE BLDE 4.0 EZ CLN MEGD (MISCELLANEOUS) ×2 IMPLANT
ELECTRODE REM PT RTRN 9FT ADLT (ELECTROSURGICAL) ×2 IMPLANT
GLOVE BIOGEL PI IND STRL 7.0 (GLOVE) ×4 IMPLANT
GLOVE BIOGEL PI IND STRL 8 (GLOVE) ×2 IMPLANT
GLOVE ECLIPSE 6.5 STRL STRAW (GLOVE) IMPLANT
GLOVE ECLIPSE 7.0 STRL STRAW (GLOVE) ×4 IMPLANT
GLOVE SURG SYN 7.5  E (GLOVE) ×8
GLOVE SURG SYN 7.5 E (GLOVE) ×8 IMPLANT
GLOVE SURG SYN 7.5 PF PI (GLOVE) ×4 IMPLANT
GOWN STRL REIN XL XLG (GOWN DISPOSABLE) ×2 IMPLANT
GOWN STRL REUS W/ TWL XL LVL3 (GOWN DISPOSABLE) ×2 IMPLANT
GOWN STRL REUS W/TWL XL LVL3 (GOWN DISPOSABLE) ×10
HANDPIECE INTERPULSE COAX TIP (DISPOSABLE) ×2
HEAD FEMORAL 32 CERAMIC (Hips) IMPLANT
HOOD PEEL AWAY FLYTE STAYCOOL (MISCELLANEOUS) ×4 IMPLANT
IV NS IRRIG 3000ML ARTHROMATIC (IV SOLUTION) ×2 IMPLANT
K-WIRE ACE 1.6X6 (WIRE) ×6
KIT BASIN OR (CUSTOM PROCEDURE TRAY) ×4 IMPLANT
KIT DRSG PREVENA PLUS 7DAY 125 (MISCELLANEOUS) IMPLANT
KIT TURNOVER KIT B (KITS) ×2 IMPLANT
KWIRE ACE 1.6X6 (WIRE) IMPLANT
LINER ACET PNNCL PLUS4 NEUTRAL (Hips) IMPLANT
MANIFOLD NEPTUNE II (INSTRUMENTS) ×2 IMPLANT
MARKER SKIN DUAL TIP RULER LAB (MISCELLANEOUS) ×2 IMPLANT
NDL 22X1.5 STRL (OR ONLY) (MISCELLANEOUS) ×2 IMPLANT
NDL SPNL 18GX3.5 QUINCKE PK (NEEDLE) ×2 IMPLANT
NEEDLE 22X1.5 STRL (OR ONLY) (MISCELLANEOUS) ×2 IMPLANT
NEEDLE SPNL 18GX3.5 QUINCKE PK (NEEDLE) ×2 IMPLANT
NS IRRIG 1000ML POUR BTL (IV SOLUTION) ×2 IMPLANT
PACK ORTHO EXTREMITY (CUSTOM PROCEDURE TRAY) ×2 IMPLANT
PACK TOTAL JOINT (CUSTOM PROCEDURE TRAY) ×2 IMPLANT
PACK UNIVERSAL I (CUSTOM PROCEDURE TRAY) IMPLANT
PENCIL BUTTON HOLSTER BLD 10FT (ELECTRODE) IMPLANT
PINNACLE PLUS 4 NEUTRAL (Hips) ×2 IMPLANT
PLATE LOCK 3H STD RT PROX TIB (Plate) IMPLANT
SAW OSC TIP CART 19.5X105X1.3 (SAW) ×2 IMPLANT
SCREW 6.5MMX25MM (Screw) IMPLANT
SCREW 6.5MMX30MM (Screw) IMPLANT
SCREW CORTICAL 3.5MM  34MM (Screw) ×2 IMPLANT
SCREW CORTICAL 3.5MM 34MM (Screw) IMPLANT
SCREW CORTICAL 3.5MM 38MM (Screw) IMPLANT
SCREW LOCK 3.5X60 DIST TIB (Screw) IMPLANT
SCREW LOCK 3.5X70 DIST TIB (Screw) IMPLANT
SCREW LOCK CORT STAR 3.5X54 (Screw) IMPLANT
SCREW LOCK CORT STAR 3.5X60 (Screw) IMPLANT
SCREW LOCK CORT STAR 3.5X70 (Screw) IMPLANT
SCREW LOCK CORT STAR 3.5X75 (Screw) IMPLANT
SCREW LOW PROF CORTICAL 3.5X80 (Screw) IMPLANT
SCREW LP 3.5X60MM (Screw) IMPLANT
SCREW LP 3.5X65MM (Screw) IMPLANT
SCREW PINN CAN BONE 6.5MMX15MM (Screw) IMPLANT
SET HNDPC FAN SPRY TIP SCT (DISPOSABLE) ×2 IMPLANT
SPONGE T-LAP 18X18 ~~LOC~~+RFID (SPONGE) IMPLANT
STEM FEM ACTIS STD SZ4 (Stem) IMPLANT
STRIP CLOSURE SKIN 1/2X4 (GAUZE/BANDAGES/DRESSINGS) IMPLANT
SUT ETHIBOND 2 V 37 (SUTURE) ×2 IMPLANT
SUT ETHILON 2 0 PSLX (SUTURE) IMPLANT
SUT MNCRL AB 3-0 PS2 18 (SUTURE) IMPLANT
SUT VIC AB 0 CT1 27 (SUTURE) ×6
SUT VIC AB 0 CT1 27XBRD ANBCTR (SUTURE) ×2 IMPLANT
SUT VIC AB 1 CT1 27 (SUTURE) ×4
SUT VIC AB 1 CT1 27XBRD ANBCTR (SUTURE) IMPLANT
SUT VIC AB 1 CTX 36 (SUTURE) ×2
SUT VIC AB 1 CTX36XBRD ANBCTR (SUTURE) ×2 IMPLANT
SUT VIC AB 2-0 CT1 27 (SUTURE) ×8
SUT VIC AB 2-0 CT1 TAPERPNT 27 (SUTURE) ×4 IMPLANT
SYR 20ML ECCENTRIC (SYRINGE) ×2 IMPLANT
SYR 50ML LL SCALE MARK (SYRINGE) ×2 IMPLANT
TOWEL GREEN STERILE (TOWEL DISPOSABLE) ×2 IMPLANT
TUBE CONNECTING 12X1/4 (SUCTIONS) IMPLANT
TUBE SUCT ARGYLE STRL (TUBING) IMPLANT
WATER STERILE IRR 1000ML POUR (IV SOLUTION) ×4 IMPLANT
YANKAUER SUCT BULB TIP NO VENT (SUCTIONS) ×2 IMPLANT

## 2022-01-22 NOTE — Op Note (Unsigned)
NAME: Christina Mack, Christina Mack MEDICAL RECORD NO: [id number] ACCOUNT NO: [id number] DATE OF BIRTH: [birth year] FACILITY: [facility] LOCATION: [facility] PHYSICIAN: Yetta Barre. Marlou Sa, MD  Operative Report   DATE OF PROCEDURE: 01/22/2022  PREOPERATIVE DIAGNOSIS:  Right lateral tibial plateau fracture, nondisplaced.  POSTOPERATIVE DIAGNOSIS:  Right lateral tibial plateau fracture, nondisplaced.  PROCEDURE:  Right tibial plateau fracture open reduction internal fixation and plating with Biomet lateral tibial plateau plate.  SURGEON ATTENDING:  Yetta Barre. Marlou Sa, MD  ASSISTANT:  Annie Main, PA  INDICATIONS:  The patient is a 56 year old patient who sustained right femoral neck fracture and right tibial plateau fracture with a fall earlier this week.  Presents now for operative management to allow for earlier mobilization and weightbearing.  PROCEDURE DETAILS:  The patient was brought to the operating room where general anesthetic was induced. Total hip arthroplasty was performed by Dr. Beatriz Stallion with Dr. Marlou Sa assisting and that is dictated in a separate heading.  Following that procedure,  the patient was maintained on the Hana bed.  Right leg and knee region was then pre-scrubbed with alcohol and Betadine, allowed to air dry, prepped with DuraPrep solution and draped in sterile manner.  Ioban used to cover the operative field.  A second  timeout was called.  A curvilinear incision was made just lateral to the tibial tubercle and tibial crest, extending proximally to just above the joint line.  Skin and subcutaneous tissue were sharply divided.  The fascia of the anterior compartment was  incised and subperiosteal elevation was performed.  This soft tissue split was also carried up to the level of the Gerdy's tubercle.  With that dissection performed, a lateral plate was applied.  Correct location confirmed in the AP and lateral planes  under fluoroscopy.  Nonlocking screws placed distally and locking screws placed  proximally and the plate was positioned in such a manner to buttress the lateral tibial plateau fracture to allow for early weightbearing.  Screw placement confirmed in the  AP and lateral planes.  Thorough irrigation was then performed.  Skin edges were anesthetized using a combination of Marcaine, morphine, clonidine.  Vancomycin powder placed over the implant.  The fascia was then closed over the inferior portion of the  plate.  The skin was then closed using 0 Vicryl suture deep layers, 2-0 Vicryl suture, and then 3-0 Monocryl with Steri-Strips and Prevena wound VAC applied to the incision.  Luke's assistance was required at all times for retraction, opening, closing,  mobilization of tissue.  The patient will be allowed to be weightbearing as tolerated.  Range of motion of the knee also as tolerated.  Luke's assistance was required for all portions of the case including opening, closing, screw placement and hardware  positioning.  His assistance was a medical necessity.   VAI D: 01/22/2022 11:43:36 am T: 01/22/2022 9:27:00 pm  JOB: [id number]

## 2022-01-22 NOTE — Progress Notes (Signed)
PROGRESS NOTE  Christina Mack  DOB: September 25, 1965  PCP: System, Provider Not In PYP:[id number]  DOA: 01/19/2022  LOS: 3 days  Hospital Day: 4  Brief narrative: Christina Mack is a 56 y.o. 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   10/19, patient presented with mechanical fall leading to right knee swelling. She was walking her dog was became uncontrolled and dragged the leash.  Patient suddenly lost her balance and fell on her right side landing on her right hip and knee.  She had severe pain in the right knee and hip, was not able to get up.   Her last dose of Eliquis was on the evening of 10/18.  In the ED, patient was afebrile, blood pressure elevated to 150s to 170s, breathing on room air. Labs with WC count 2.9, hemoglobin low at 6.4, platelet low at 111, BUN/creatinine elevated to 82/6.79  CT scans of the right knee and right hip were done with findings as below:  1. Subcapital right femoral fracture with mild apex anterior angulation. 2. Similar Nonunited pathologic fracture of the lateral right superior pubic ramus. 3. Lytic lesion of the inferior pubic ramus with cortical disruption underlying the peroneal soft tissue defect,  4. No definite acute fracture of the right knee area. Large lipohemarthrosis suspicious for occult fracture. Consider MRI for further evaluation.  MRI right knee showed  1. Nondisplaced fracture of the lateral tibial plateau. 2. Subtle marrow edema of the lateral aspect of the lateral femoral condyle suggesting bone contusion. 3. Large knee joint effusion with lipohemarthrosis.  Admitted to [place] consulted 10/22, patient underwent THA right hip, ORIF right tibial  plateau.  Subjective: Patient was seen and examined this afternoon, postoperatively. Waking up from anesthesia. Family at bedside.  Assessment and plan: Acute subcapital right femur fracture Acute nondisplaced fracture of the lateral tibial plateau Presented after mechanical fall leading to right hip pain, right knee pain and gross swelling in the area CT scan and MRI findings as above Orthopedics consult appreciated.   This morning, 10/22, patient underwent THA right hip, ORIF right tibial plateau. Eliquis remains on hold.  For pain control, patient is currently on as needed IV fentanyl and as needed oral Norco. For DVT prophylaxis, patient refuses Lovenox.  I will resume Eliquis.  Discussed with orthopedics Dr. Marlou Sa.  Right pubic fracture CT scan noted nonunited pathological fracture of the right superior pubic ramus. It also showed likely lesion of the inferior pubic ramus with cortical disruption underlying the perineal soft tissue defect.  It is probably related to patient's pre-existing vulvar cancer. Orthopedics to comment.  Acute blood loss anemia secondary to large hemarthrosis of the right knee after the fall Last dose of Eliquis was on the night of 10/18. Currently Eliquis is on hold. Initial hemoglobin low at 6.4.  So far patient received 2 units of PRBCs.  Hemoglobin at 9.1 on last check yesterday 10/21.  Continue to monitor. Recent Labs    01/19/22 1251 01/19/22 2332 01/20/22 0511 01/21/22 0405  HGB 6.4* 7.0* 7.0* 9.1*  MCV 96.6  --  93.9 91.1   s/p renal transplant with chronic rejection  CKD stage IV  Baseline creatinine over 6. Per H&P, worsening of creatinine level started July 2023, she went to see  her nephrology at [facility] 2 weeks ago, when she was diagnosed with chronic rejection.  And was instructed to increase tacrolimus but later decreased to 2 mg twice daily due to worsening of kidney function.  As per [facility] nephrology's note recently,  tacrolimus level  should be in the narrow range of 5-7.   Tacrolimus level sent.  Pending results. Continue prednisone Recent Labs    01/19/22 1251 01/20/22 0511 01/21/22 0405  BUN 82* 76* 76*  CREATININE 6.79* 6.57* 6.74*   History of vulvar cancer Completed chemo and radiation therapy, PET scan stable recently, outpatient followup with oncology.  History of chronic pelvic ulcer and osteomyelitis Secondary to radiation therapy, she completed Zyvox treatment in September and last session of hyperbaric chamber treatment last week.  She reported much improved wound locally, and she is scheduled to see wound care center next Tuesday.   HTN Continue Coreg Add Hydralazine, aiming at BP goal of 130/80   Chronic DVT She had DVT in right thigh in 2021 during chemo and radiation.  On Eliquis since then.    Goals of care   Code Status: Full Code    Mobility: Needs PT eval postprocedure  Skin assessment:     Nutritional status:  Body mass index is 25.31 kg/m.          Diet:  Diet Order             Diet Carb Modified Fluid consistency: Thin; Room service appropriate? Yes  Diet effective now                   DVT prophylaxis:  SCDs Start: 01/22/22 1316 Place TED hose Start: 01/22/22 1316 Place and maintain sequential compression device Start: 01/21/22 0736 SCDs Start: 01/19/22 1616 apixaban (ELIQUIS) tablet 5 mg   Antimicrobials: None Fluid: None Consultants: Orthopedics Family Communication: None at bedside  Status is: Inpatient  Continue inhospital care because: POD 0 Level of care: Med-Surg   Dispo: The patient is from: Home              Anticipated d/c is to: Pending clinical course              Patient currently is not medically stable to d/c.   Difficult to place patient No     Infusions:   sodium chloride Stopped (01/22/22 0933)   sodium chloride 75 mL/hr at 01/22/22 1519    ceFAZolin (ANCEF) IV     methocarbamol (ROBAXIN) IV     tranexamic acid       Scheduled Meds:  acetaminophen  500 mg Oral Q6H   apixaban  5 mg Oral BID   carvedilol  25 mg Oral BID WC   docusate sodium  100 mg Oral BID   pantoprazole  40 mg Oral BID   predniSONE  5 mg Oral Q breakfast   sodium bicarbonate  1,300 mg Oral TID   tacrolimus  2 mg Oral BID    PRN meds: [START ON 01/24/2022] acetaminophen, alum & mag hydroxide-simeth, bisacodyl, fentaNYL (SUBLIMAZE) injection, hydrALAZINE, HYDROcodone-acetaminophen, HYDROcodone-acetaminophen, HYDROcodone-acetaminophen, magnesium citrate, menthol-cetylpyridinium **OR** phenol, methocarbamol **OR** methocarbamol (ROBAXIN) IV, ondansetron **OR** ondansetron (ZOFRAN) IV, polyethylene glycol, senna-docusate, sorbitol   Antimicrobials: Anti-infectives (From admission, onward)    Start     Dose/Rate Route Frequency Ordered Stop   01/22/22 1600  ceFAZolin (ANCEF) IVPB 2g/100 mL premix        2 g 200 mL/hr over 30 Minutes Intravenous Every 6 hours 01/22/22 1316 01/23/22 0959  01/22/22 0924  vancomycin (VANCOCIN) powder  Status:  Discontinued          As needed 01/22/22 0924 01/22/22 1207   01/22/22 0745  ceFAZolin (ANCEF) IVPB 2g/100 mL premix        2 g 200 mL/hr over 30 Minutes Intravenous On call to O.R. 01/22/22 0740 01/22/22 0800   01/22/22 0600  ceFAZolin (ANCEF) IVPB 2g/100 mL premix  Status:  Discontinued        2 g 200 mL/hr over 30 Minutes Intravenous On call to O.R. 01/21/22 1940 01/22/22 1209       Objective: Vitals:   01/22/22 1240 01/22/22 1256  BP: (!) 141/95 (!) 144/101  Pulse: 77 75  Resp: 15 19  Temp: 98.5 F (36.9 C) 97.9 F (36.6 C)  SpO2: 94% 97%    Intake/Output Summary (Last 24 hours) at 01/22/2022 1539 Last data filed at 01/22/2022 1141 Gross per 24 hour  Intake 1300 ml  Output 575 ml  Net 725 ml   Filed Weights   01/19/22 1146 01/22/22 0717  Weight: 69 kg 69 kg   Weight change:  Body mass index is 25.31 kg/m.   Physical Exam: General exam: Pleasant middle-aged  Caucasian female. Pain controlled at this time Skin: No rashes, lesions or ulcers. HEENT: Atraumatic, normocephalic, no obvious bleeding Lungs: Clear to auscultation bilaterally CVS: Regular rate and rhythm, no murmur GI/Abd: Nontender, nondistended, bowel sound present CNS: Waking up from anesthesia, able to answer orientation questions Psychiatry: Mood appropriate Extremities: Right knee, thigh and knee with postop compression bandage throughout.  Wound VAC in place.  Data Review: I have personally reviewed the laboratory data and studies available.  F/u labs ordered Unresulted Labs (From admission, onward)     Start     Ordered   01/23/22 7416  Basic metabolic panel  Tomorrow morning,   R       Question:  Specimen collection method  Answer:  Lab=Lab collect   01/22/22 0808   01/23/22 0500  CBC with Differential/Platelet  Tomorrow morning,   R       Question:  Specimen collection method  Answer:  Lab=Lab collect   01/22/22 0808   01/23/22 0500  CBC  Daily at 5am,   R     Comments: For 3 days.   Question:  Specimen collection method  Answer:  Lab=Lab collect   01/22/22 1316   01/23/22 3845  Basic metabolic panel  Daily at 5am,   R     Comments: For 2 days .   Question:  Specimen collection method  Answer:  Lab=Lab collect   01/22/22 1316   01/19/22 1517  Tacrolimus level  Add-on,   AD        01/19/22 1516            Signed, Terrilee Croak, MD [facility] 01/22/2022

## 2022-01-22 NOTE — Progress Notes (Addendum)
ANTICOAGULATION CONSULT NOTE - Initial Consult  Pharmacy Consult for Eliquis Indication: chronic DVT, cancer  Allergies  Allergen Reactions   Oxycodone-Acetaminophen Itching, Nausea Only, Other (See Comments), Nausea And Vomiting and Rash    Other Reaction: HA Other Reaction: HA    Zosyn [Piperacillin Sod-Tazobactam So] Other (See Comments)    Thrombocytopenia   Hydromorphone Nausea And Vomiting, Rash and Other (See Comments)   Amlodipine Other (See Comments)    Ankle swelling   Codeine     vomiting    Patient Measurements: Height: '5\' 5"'$  (165.1 cm) Weight: 69 kg (152 lb 1.9 oz) IBW/kg (Calculated) : 57   Vital Signs: Temp: 97.9 F (36.6 C) (10/22 1256) Temp Source: Oral (10/22 1256) BP: 144/101 (10/22 1256) Pulse Rate: 75 (10/22 1256)  Labs: Recent Labs    01/19/22 2332 01/20/22 0511 01/21/22 0405  HGB 7.0* 7.0* 9.1*  HCT 21.8* 21.4* 27.5*  PLT  --  103* 113*  CREATININE  --  6.57* 6.74*    Estimated Creatinine Clearance: 9.3 mL/min (A) (by C-G formula based on SCr of 6.74 mg/dL (H)).   Medical History: Past Medical History:  Diagnosis Date   Complication of anesthesia    nausea and vomiting   DVT (deep venous thrombosis) (HCC)    History of osteomyelitis    Kidney transplant recipient    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[facility])     Assessment: 56 yo female on Eliquis PTA for chronic DVT.  Now s/p ORIF, per discussion between Drs. Dean and Dahal, okay to resume Eliquis tonight.  Per discussion with patient and review of rehab records, patient is taking 2.5 mg of Eliquis only once daily.  It appears she had nosebleeds when taking it twice a day, and the decision was made to adjust dose to 2.5 mg once daily.  She was discharged 05/17/20 from [facility] inpt rehab on 2.5 mg once daily.  Goal of Therapy:   Monitor platelets by anticoagulation protocol: Yes   Plan:  Discussed with Dr. Pietro Cassis - will continue with previous dosing of 2.5 mg once daily.    [place] Crane, Roylene Reason, BCCP Clinical Pharmacist  01/22/2022 3:40 PM   [facility] pharmacy phone numbers are listed on [email or web address]

## 2022-01-22 NOTE — Anesthesia Procedure Notes (Signed)
Procedure Name: Intubation Date/Time: 01/22/2022 7:55 AM  Performed by: Sammie Bench, CRNAPre-anesthesia Checklist: Patient identified, Emergency Drugs available, Suction available and Patient being monitored Patient Re-evaluated:Patient Re-evaluated prior to induction Oxygen Delivery Method: Circle System Utilized Preoxygenation: Pre-oxygenation with 100% oxygen Induction Type: IV induction Ventilation: Mask ventilation without difficulty Laryngoscope Size: Mac and 3 Grade View: Grade I Tube type: Oral Tube size: 7.0 mm Number of attempts: 1 Airway Equipment and Method: Stylet and Oral airway Placement Confirmation: ETT inserted through vocal cords under direct vision, positive ETCO2 and breath sounds checked- equal and bilateral Secured at: 22 cm Tube secured with: Tape Dental Injury: Teeth and Oropharynx as per pre-operative assessment

## 2022-01-22 NOTE — Brief Op Note (Signed)
   01/19/2022 - 01/22/2022  11:38 AM  PATIENT:  Christina Mack  56 y.o. female  PRE-OPERATIVE DIAGNOSIS:  Right Femoral Neck Fracture Right tibial plateau fracture   POST-OPERATIVE DIAGNOSIS:  Right Femoral Neck Fracture Right tibial plateau fracture  PROCEDURE:  Procedure(s): TOTAL HIP ARTHROPLASTY ANTERIOR APPROACH OPEN REDUCTION INTERNAL FIXATION (ORIF) TIBIAL PLATEAU APPLICATION OF WOUND VAC  SURGEON:  Surgeon(s): Leandrew Koyanagi, MD Meredith Pel, MD Meredith Pel, MD  ASSISTANT: Annie Main, PA for the tibial plateau fracture  ANESTHESIA:   general  EBL: 75 ml    Total I/O In: 700 [I.V.:500; IV Piggyback:200] Out: 300 [Blood:300]  BLOOD ADMINISTERED: none  DRAINS: Prevena incisional wound VAC x2 for the hip and leg  LOCAL MEDICATIONS USED: Marcaine morphine clonidine   SPECIMEN:  No Specimen  COUNTS:  YES  TOURNIQUET:  * No tourniquets in log *  DICTATION: .Other Dictation: Dictation Number [id number]  PLAN OF CARE: Admit to inpatient   PATIENT DISPOSITION:  PACU - hemodynamically stable

## 2022-01-22 NOTE — Discharge Instructions (Signed)

## 2022-01-22 NOTE — Progress Notes (Signed)
PROGRESS NOTE  Christina Mack  DOB: 1965-07-14  PCP: System, Provider Not In UEA:[id number]  DOA: 01/19/2022  LOS: 3 days  Hospital Day: 4  Brief narrative: Christina Mack is a 56 y.o. 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   10/19, patient presented with mechanical fall leading to right knee swelling. She was walking her dog was became uncontrolled and dragged the leash.  Patient suddenly lost her balance and fell on her right side landing on her right hip and knee.  She had severe pain in the right knee and hip, was not able to get up.   Her last dose of Eliquis was on the evening of 10/18.  In the ED, patient was afebrile, blood pressure elevated to 150s to 170s, breathing on room air. Labs with WC count 2.9, hemoglobin low at 6.4, platelet low at 111, BUN/creatinine elevated to 82/6.79  CT scans of the right knee and right hip were done with findings as below:  1. Subcapital right femoral fracture with mild apex anterior angulation. 2. Similar Nonunited pathologic fracture of the lateral right superior pubic ramus. 3. Lytic lesion of the inferior pubic ramus with cortical disruption underlying the peroneal soft tissue defect,  4. No definite acute fracture of the right knee area. Large lipohemarthrosis suspicious for occult fracture. Consider MRI for further evaluation.  MRI right knee showed  1. Nondisplaced fracture of the lateral tibial plateau. 2. Subtle marrow edema of the lateral aspect of the lateral femoral condyle suggesting bone contusion. 3. Large knee joint effusion with lipohemarthrosis.  Admitted to [place] consulted  Subjective: Patient was seen and examined this morning.  Lying down on  bed.  Not in distress.  Pain controlled. Family at bedside.  Assessment and plan: Acute subcapital right femur fracture Acute nondisplaced fracture of the lateral tibial plateau Presented after mechanical fall leading to right hip pain, right knee pain and gross swelling in the area CT scan and MRI findings as above Orthopedics consult appreciated.  Pending ORIF, likely tomorrow. Eliquis is on hold.  N.p.o. after midnight. For pain control, patient is currently on as needed IV morphine and as needed oral Norco.  Right pubic fracture CT scan noted nonunited pathological fracture of the right superior pubic ramus. It also showed likely lesion of the inferior pubic ramus with cortical disruption underlying the perineal soft tissue defect.  It is probably related to patient's pre-existing vulvar cancer. Orthopedics to comment.  Acute blood loss anemia secondary to large hemarthrosis of the right knee after the fall Last dose of Eliquis was on the night of 10/18. Currently Eliquis is on hold. Initial hemoglobin low at 6.4.  So far patient received 2 units of PRBCs.  Hemoglobin at 9.1 this morning.  Continue to monitor. Recent Labs    01/19/22 1251 01/19/22 2332 01/20/22 0511 01/21/22 0405  HGB 6.4* 7.0* 7.0* 9.1*  MCV 96.6  --  93.9 91.1   s/p renal transplant with chronic rejection  CKD stage IV  Baseline creatinine over 6. Per H&P, worsening of creatinine level started July 2023, she went to see her nephrology at [facility] 2 weeks ago, when she was diagnosed with chronic rejection.  And was instructed to increase tacrolimus but later decreased to 2 mg twice daily  due to worsening of kidney function.  As per [facility] nephrology's note recently,  tacrolimus level should be in the narrow range of 5-7.   Tacrolimus level sent.  Pending results. Continue prednisone Recent Labs    01/19/22 1251 01/20/22 0511 01/21/22 0405  BUN 82* 76* 76*  CREATININE 6.79* 6.57* 6.74*   History of vulvar  cancer Completed chemo and radiation therapy, PET scan stable recently, outpatient followup with oncology.  History of chronic pelvic ulcer and osteomyelitis Secondary to radiation therapy, she completed Zyvox treatment in September and last session of hyperbaric chamber treatment last week.  She reported much improved wound locally, and she is scheduled to see wound care center next Tuesday.   HTN Continue Coreg Add Hydralazine, aiming at BP goal of 130/80   Chronic DVT She had DVT in right thigh in 2021 during chemo and radiation.  She is on Eliquis since then.   Currently on hold preoperatively. SCDs for now  Goals of care   Code Status: Full Code    Mobility: Needs PT eval postprocedure  Skin assessment:     Nutritional status:  Body mass index is 25.31 kg/m.          Diet:  Diet Order             Diet Carb Modified Fluid consistency: Thin; Room service appropriate? Yes  Diet effective now                   DVT prophylaxis:  SCDs Start: 01/22/22 1316 Place TED hose Start: 01/22/22 1316 Place and maintain sequential compression device Start: 01/21/22 0736 SCDs Start: 01/19/22 1616   Antimicrobials: None Fluid: None Consultants: Orthopedics Family Communication: None at bedside  Status is: Inpatient  Continue inhospital care because: Pending hip fracture surgery Level of care: Med-Surg   Dispo: The patient is from: Home              Anticipated d/c is to: Pending clinical course              Patient currently is not medically stable to d/c.   Difficult to place patient No     Infusions:   sodium chloride Stopped (01/22/22 0933)   sodium chloride      ceFAZolin (ANCEF) IV     methocarbamol (ROBAXIN) IV     tranexamic acid      Scheduled Meds:  acetaminophen  500 mg Oral Q6H   carvedilol  25 mg Oral BID WC   docusate sodium  100 mg Oral BID   pantoprazole  40 mg Oral BID   predniSONE  5 mg Oral Q breakfast   sodium bicarbonate  1,300  mg Oral TID   tacrolimus  2 mg Oral BID    PRN meds: [START ON 01/24/2022] acetaminophen, alum & mag hydroxide-simeth, bisacodyl, fentaNYL (SUBLIMAZE) injection, hydrALAZINE, HYDROcodone-acetaminophen, HYDROcodone-acetaminophen, HYDROcodone-acetaminophen, magnesium citrate, menthol-cetylpyridinium **OR** phenol, methocarbamol **OR** methocarbamol (ROBAXIN) IV, morphine injection, ondansetron **OR** ondansetron (ZOFRAN) IV, polyethylene glycol, senna-docusate, sorbitol   Antimicrobials: Anti-infectives (From admission, onward)    Start     Dose/Rate Route Frequency Ordered Stop   01/22/22 1600  ceFAZolin (ANCEF) IVPB 2g/100 mL premix        2 g 200 mL/hr over 30 Minutes Intravenous Every 6 hours 01/22/22 1316 01/23/22 0959   01/22/22 0924  vancomycin (VANCOCIN) powder  Status:  Discontinued          As needed 01/22/22 0924 01/22/22 1207   01/22/22 0745  ceFAZolin (  ANCEF) IVPB 2g/100 mL premix        2 g 200 mL/hr over 30 Minutes Intravenous On call to O.R. 01/22/22 0740 01/22/22 0800   01/22/22 0600  ceFAZolin (ANCEF) IVPB 2g/100 mL premix  Status:  Discontinued        2 g 200 mL/hr over 30 Minutes Intravenous On call to O.R. 01/21/22 1940 01/22/22 1209       Objective: Vitals:   01/22/22 1240 01/22/22 1256  BP: (!) 141/95 (!) 144/101  Pulse: 77 75  Resp: 15 19  Temp: 98.5 F (36.9 C) 97.9 F (36.6 C)  SpO2: 94% 97%    Intake/Output Summary (Last 24 hours) at 01/22/2022 1349 Last data filed at 01/22/2022 1141 Gross per 24 hour  Intake 1300 ml  Output 575 ml  Net 725 ml   Filed Weights   01/19/22 1146 01/22/22 0717  Weight: 69 kg 69 kg   Weight change:  Body mass index is 25.31 kg/m.   Physical Exam: General exam: Pleasant middle-aged Caucasian female.  Pain controlled at this time Skin: No rashes, lesions or ulcers. HEENT: Atraumatic, normocephalic, no obvious bleeding Lungs: Clear to auscultation bilaterally CVS: Regular rate and rhythm, no murmur GI/Abd:  Nontender, nondistended, bowel sound present CNS: Alert, awake, oriented x3 Psychiatry: Sad affect Extremities: Right knee, thigh and knee.  Grossly swollen.  No external injury noted.  Data Review: I have personally reviewed the laboratory data and studies available.  F/u labs ordered Unresulted Labs (From admission, onward)     Start     Ordered   01/23/22 3710  Basic metabolic panel  Tomorrow morning,   R       Question:  Specimen collection method  Answer:  Lab=Lab collect   01/22/22 0808   01/23/22 0500  CBC with Differential/Platelet  Tomorrow morning,   R       Question:  Specimen collection method  Answer:  Lab=Lab collect   01/22/22 0808   01/23/22 0500  CBC  Daily at 5am,   R     Comments: For 3 days.   Question:  Specimen collection method  Answer:  Lab=Lab collect   01/22/22 1316   01/23/22 6269  Basic metabolic panel  Daily at 5am,   R     Comments: For 2 days .   Question:  Specimen collection method  Answer:  Lab=Lab collect   01/22/22 1316   01/19/22 1517  Tacrolimus level  Add-on,   AD        01/19/22 1516            Signed, Terrilee Croak, MD [facility] 01/22/2022

## 2022-01-22 NOTE — Op Note (Addendum)
TOTAL HIP ARTHROPLASTY ANTERIOR APPROACH  Procedure Note LEANE LORING   [id number]  Pre-op Diagnosis: Right femoral neck fracture     Post-op Diagnosis: same  Operative Findings Displaced femoral neck fracture, fracture hematoma Mild chondromalacia of articular surfaces   Operative Procedures  1. Total hip replacement; Right hip; uncemented cpt-[id number]  2.  Application of incisional VAC right hip. CPT 99833  Surgeon: Frankey Shown, M.D.  Assistant Surgeon: Alphonzo Severance, M.D.   Anesthesia: general  Prosthesis: Depuy Acetabulum: Pinnacle 50 mm Femur: Actis 4 STD Head: 32 mm size: +1 Liner: +4 Bearing Type: ceramic/poly  Total Hip Arthroplasty (Anterior Approach) Op Note:  After informed consent was obtained and the operative extremity marked in the holding area, the patient was brought back to the operating room and placed supine on the HANA table. Next, the operative extremity was prepped and draped in normal sterile fashion. Surgical timeout occurred verifying patient identification, surgical site, surgical procedure and administration of antibiotics.  A Hueter approach to the hip was performed, using the interval between tensor fascia lata and sartorius.  Dissection was carried bluntly down onto the anterior hip capsule. The lateral femoral circumflex vessels were identified and coagulated. A capsulotomy was performed and the capsular flaps tagged for later repair.  The neck osteotomy was performed. The femoral head was removed which showed mild chondromalacia, the acetabular rim was cleared of soft tissue and attention was turned to reaming the acetabulum.  Sequential reaming was performed under fluoroscopic guidance. We reamed to a size 49 mm, and then impacted the acetabular shell.  The bone quality was relatively poor.  Two 25 mm cancellous screws were placed through the shell for added fixation.  Each screw had excellent purchase.  The liner was then placed after irrigation and  attention turned to the femur.  After placing the femoral hook, the leg was taken to externally rotated, extended and adducted position taking care to perform soft tissue releases to allow for adequate mobilization of the femur. Soft tissue was cleared from the shoulder of the greater trochanter and the hook elevator used to improve exposure of the proximal femur. Sequential broaching performed up to a size 4. Trial neck and head were placed. The leg was brought back up to neutral and the construct reduced.  Antibiotic irrigation was placed in the surgical wound.  The position and sizing of components, offset and leg lengths were checked using fluoroscopy. Stability of the construct was checked in extension and external rotation without any subluxation or impingement of prosthesis. We dislocated the prosthesis, dropped the leg back into position, removed trial components, and irrigated copiously. The final stem and head was then placed, the leg brought back up, the system reduced and fluoroscopy used to verify positioning.  We irrigated, obtained hemostasis and closed the capsule using #2 ethibond suture.  One gram of vancomycin powder was placed in the surgical bed.   One gram of topical tranexamic acid was injected into the joint.  The fascia was closed with #1 vicryl plus, the deep fat layer was closed with 0 vicryl, the subcutaneous layers closed with 2.0 Vicryl Plus and the skin closed with 2.0 nylon and incisional VAC.  The patient was awakened in the operating room and taken to recovery in stable condition.  All sponge, needle, and instrument counts were correct at the end of the case.   Dr. Alphonzo Severance was a medical necessity for opening, closing, limb positioning, retracting, exposing, and overall facilitation and timely completion  of the surgery.  Please refer to Dr. Randel Pigg op note for ORIF of the tibial plateau.   Position: supine  Complications: see description of procedure.  Time Out:  performed   Drains/Packing: none  Estimated blood loss: see anesthesia record  Returned to Recovery Room: in good condition.   Antibiotics: yes   Mechanical VTE (DVT) Prophylaxis: sequential compression devices, TED thigh-high  Chemical VTE (DVT) Prophylaxis: resume eliquis POD 1 if hemoglobin is acceptable   Fluid Replacement: see anesthesia record  Specimens Removed: 1 to pathology   Sponge and Instrument Count Correct? yes   PACU: portable radiograph - low AP   Plan/RTC: Return in 2 weeks for staple removal. Weight Bearing/Load Lower Extremity: full  Hip precautions: none Suture Removal: 2 weeks   N. Eduard Roux, MD [facility] 10:00 AM   Implant Name Type Inv. Item Serial No. Manufacturer Lot No. LRB No. Used Action  CUP SECTOR GRIPTON 50MM - [id number] Cup CUP SECTOR GRIPTON 50MM  [employer] [id number] Right 1 Implanted  PINNACLE PLUS 4 NEUTRAL - [id number] Hips PINNACLE PLUS 4 NEUTRAL  [employer] [id number] Right 1 Implanted  SCREW PINN CAN BONE 6.4ELT53UY - [id number] Screw SCREW PINN CAN BONE 6.5MMX15MM  [employer] [id number] Right 1 Implanted  SCREW 6.5MMX25MM - [id number] Screw SCREW 6.5MMX25MM  [employer] [id number] Right 1 Implanted  STEM FEM ACTIS STD SZ4 - [id number] Stem STEM FEM ACTIS STD SZ4  [employer] [id number] Right 1 Implanted  HEAD FEMORAL 32 CERAMIC - [id number] Hips HEAD FEMORAL 32 CERAMIC  [employer] [id number] Right 1 Implanted  SCREW 6.5MMX30MM - [id number] Screw SCREW 6.5MMX30MM  [employer] [id number] Right 1 Implanted

## 2022-01-22 NOTE — Anesthesia Preprocedure Evaluation (Signed)
Anesthesia Evaluation  Patient identified by MRN, date of birth, ID band Patient awake    Reviewed: Allergy & Precautions, NPO status , Patient's Chart, lab work & pertinent test results  Airway Mallampati: II  TM Distance: >3 FB Neck ROM: Full    Dental  (+) Dental Advisory Given   Pulmonary neg pulmonary ROS,    breath sounds clear to auscultation       Cardiovascular hypertension, + DVT   Rhythm:Regular Rate:Normal     Neuro/Psych negative neurological ROS     GI/Hepatic Neg liver ROS, PUD,   Endo/Other  negative endocrine ROS  Renal/GU Renal disease     Musculoskeletal negative musculoskeletal ROS (+)   Abdominal   Peds  Hematology  (+) Blood dyscrasia, anemia ,   Anesthesia Other Findings   Reproductive/Obstetrics                             Anesthesia Physical Anesthesia Plan  ASA: 3  Anesthesia Plan: General   Post-op Pain Management: Ketamine IV* and Ofirmev IV (intra-op)*   Induction: Intravenous  PONV Risk Score and Plan: 3 and Dexamethasone, Ondansetron, Treatment may vary due to age or medical condition and Midazolam  Airway Management Planned: Oral ETT  Additional Equipment: None  Intra-op Plan:   Post-operative Plan: Extubation in OR  Informed Consent: I have reviewed the patients History and Physical, chart, labs and discussed the procedure including the risks, benefits and alternatives for the proposed anesthesia with the patient or authorized representative who has indicated his/her understanding and acceptance.     Dental advisory given  Plan Discussed with: CRNA  Anesthesia Plan Comments:         Anesthesia Quick Evaluation

## 2022-01-22 NOTE — Transfer of Care (Signed)
Immediate Anesthesia Transfer of Care Note  Patient: Christina Mack  Procedure(s) Performed: TOTAL HIP ARTHROPLASTY ANTERIOR APPROACH (Right: Hip) APPLICATION OF WOUND VAC (Right: Hip) OPEN REDUCTION INTERNAL FIXATION (ORIF) TIBIAL PLATEAU (Right)  Patient Location: PACU  Anesthesia Type:General  Level of Consciousness: awake, alert , oriented, and patient cooperative  Airway & Oxygen Therapy: Patient Spontanous Breathing  Post-op Assessment: Report given to RN and Post -op Vital signs reviewed and stable  Post vital signs: Reviewed and stable  Last Vitals:  Vitals Value Taken Time  BP 152/100 01/22/22 1215  Temp    Pulse 79 01/22/22 1217  Resp 15 01/22/22 1217  SpO2 93 % 01/22/22 1217  Vitals shown include unvalidated device data.  Last Pain:  Vitals:   01/22/22 4037  TempSrc: Oral  PainSc:       Patients Stated Pain Goal: 2 (54/36/06 7703)  Complications: No notable events documented.

## 2022-01-22 NOTE — Progress Notes (Signed)
OT Cancellation Note  Patient Details Name: Christina Mack MRN: [id number] DOB: 1965/12/09   Cancelled Treatment:    Reason Eval/Treat Not Completed: Other (comment). Pt reports LLE is still numb from surgery earlier today (she can only feel her toes). Will re-attempt eval tomorrow.  [place] Circle, OTR/L [facility] [phone] Office [phone]    Almon Register 01/22/2022, 4:17 PM

## 2022-01-22 NOTE — Progress Notes (Signed)
PT Cancellation Note  Patient Details Name: [name] MRN: [id number] DOB: [birth year]   Cancelled Treatment:    Reason Eval/Treat Not Completed: Medical issues which prohibited therapy. RLE remains numb after surgery. PT will attempt to follow up tomorrow when sensation and proprioception are less affected.   Zenaida Niece 01/22/2022, 4:14 PM

## 2022-01-22 NOTE — Plan of Care (Signed)
  Problem: Education: Goal: Knowledge of General Education information will improve Description: Including pain rating scale, medication(s)/side effects and non-pharmacologic comfort measures Outcome: Completed/Met

## 2022-01-22 NOTE — Plan of Care (Signed)
  Problem: Health Behavior/Discharge Planning: Goal: Ability to manage health-related needs will improve Outcome: Progressing   

## 2022-01-22 NOTE — H&P (Signed)

## 2022-01-22 NOTE — Anesthesia Postprocedure Evaluation (Signed)
Anesthesia Post Note  Patient: Christina Mack  Procedure(s) Performed: TOTAL HIP ARTHROPLASTY ANTERIOR APPROACH (Right: Hip) APPLICATION OF WOUND VAC (Right: Hip) OPEN REDUCTION INTERNAL FIXATION (ORIF) TIBIAL PLATEAU (Right)     Patient location during evaluation: PACU Anesthesia Type: General Level of consciousness: awake and alert Pain management: pain level controlled Vital Signs Assessment: post-procedure vital signs reviewed and stable Respiratory status: spontaneous breathing, nonlabored ventilation, respiratory function stable and patient connected to nasal cannula oxygen Cardiovascular status: blood pressure returned to baseline and stable Postop Assessment: no apparent nausea or vomiting Anesthetic complications: no   No notable events documented.  Last Vitals:  Vitals:   01/22/22 1256 01/22/22 1640  BP: (!) 144/101 (!) 145/97  Pulse: 75 78  Resp: 19 17  Temp: 36.6 C 37 C  SpO2: 97% 100%    Last Pain:  Vitals:   01/22/22 1640  TempSrc: Oral  PainSc:                  Tiajuana Amass

## 2022-01-23 ENCOUNTER — Encounter (HOSPITAL_COMMUNITY): Payer: Self-pay | Admitting: Orthopaedic Surgery

## 2022-01-23 ENCOUNTER — Inpatient Hospital Stay (HOSPITAL_COMMUNITY)

## 2022-01-23 DIAGNOSIS — D649 Anemia, unspecified: Secondary | ICD-10-CM | POA: Diagnosis not present

## 2022-01-23 LAB — CBC WITH DIFFERENTIAL/PLATELET
Abs Immature Granulocytes: 0 10*3/uL (ref 0.00–0.07)
Basophils Absolute: 0 10*3/uL (ref 0.0–0.1)
Basophils Relative: 0 %
Eosinophils Absolute: 0 10*3/uL (ref 0.0–0.5)
Eosinophils Relative: 0 %
HCT: 19.5 % — ABNORMAL LOW (ref 36.0–46.0)
Hemoglobin: 6.6 g/dL — CL (ref 12.0–15.0)
Lymphocytes Relative: 18 %
Lymphs Abs: 0.6 10*3/uL — ABNORMAL LOW (ref 0.7–4.0)
MCH: 31 pg (ref 26.0–34.0)
MCHC: 33.8 g/dL (ref 30.0–36.0)
MCV: 91.5 fL (ref 80.0–100.0)
Monocytes Absolute: 0.2 10*3/uL (ref 0.1–1.0)
Monocytes Relative: 6 %
Neutro Abs: 2.6 10*3/uL (ref 1.7–7.7)
Neutrophils Relative %: 76 %
Platelets: 111 10*3/uL — ABNORMAL LOW (ref 150–400)
RBC: 2.13 MIL/uL — ABNORMAL LOW (ref 3.87–5.11)
RDW: 16.6 % — ABNORMAL HIGH (ref 11.5–15.5)
WBC: 3.4 10*3/uL — ABNORMAL LOW (ref 4.0–10.5)
nRBC: 0 % (ref 0.0–0.2)
nRBC: 0 /100 WBC

## 2022-01-23 LAB — HEMOGLOBIN AND HEMATOCRIT, BLOOD
HCT: 21.9 % — ABNORMAL LOW (ref 36.0–46.0)
Hemoglobin: 7.5 g/dL — ABNORMAL LOW (ref 12.0–15.0)

## 2022-01-23 LAB — TACROLIMUS LEVEL: Tacrolimus (FK506) - LabCorp: 26.9 ng/mL — ABNORMAL HIGH (ref 2.0–20.0)

## 2022-01-23 LAB — BASIC METABOLIC PANEL
Anion gap: 15 (ref 5–15)
BUN: 80 mg/dL — ABNORMAL HIGH (ref 6–20)
CO2: 18 mmol/L — ABNORMAL LOW (ref 22–32)
Calcium: 8.2 mg/dL — ABNORMAL LOW (ref 8.9–10.3)
Chloride: 104 mmol/L (ref 98–111)
Creatinine, Ser: 6.64 mg/dL — ABNORMAL HIGH (ref 0.44–1.00)
GFR, Estimated: 7 mL/min — ABNORMAL LOW (ref >60)
Glucose, Bld: 159 mg/dL — ABNORMAL HIGH (ref 70–99)
Potassium: 4.9 mmol/L (ref 3.5–5.1)
Sodium: 137 mmol/L (ref 135–145)

## 2022-01-23 LAB — PREPARE RBC (CROSSMATCH)

## 2022-01-23 MED ORDER — SODIUM CHLORIDE 0.9% IV SOLUTION: Freq: Once | INTRAVENOUS | Status: DC

## 2022-01-23 MED ORDER — APIXABAN 2.5 MG PO TABS
2.5000 mg | ORAL_TABLET | Freq: Two times a day (BID) | ORAL | Status: DC
  Administered 2022-01-23 – 2022-01-25 (×4): 2.5 mg via ORAL
  Filled 2022-01-23 (×4): qty 1

## 2022-01-23 NOTE — Evaluation (Signed)
Physical Therapy Evaluation Patient Details Name: Christina Mack MRN: [id number] DOB: [birth year] Today's Date: 01/23/2022  History of Present Illness  56 y.o. female presents to [facility] hospital on 01/19/2022 after sustaining a fall. Pt found to have acute subcapital R femur fx, acute nondisplaced fx of the lateral tibial plateau, and R superior pubic ramus fx. Pt underwent R THA and ORIF of tibial plateau fx on 10/22. PMH includes renal transplant, CKD IV, DVT, chronic osteomyelitis, vulvar cancer, chronic anemia.  Clinical Impression  Patient is s/p above surgery resulting in functional limitations due to the deficits listed below (see PT Problem List). Required CGA-Min guard with mobility during evaluation. Tolerated transfer to [facility] and ambulated short distance in room to sit up in recliner. Limited by RLE pain as expected post-op. Bears some weight through RLE with RW for support to stabilize.  Patient will benefit from skilled PT to increase their independence and safety with mobility to allow discharge to the venue listed below.          Recommendations for follow up therapy are one component of a multi-disciplinary discharge planning process, led by the attending physician.  Recommendations may be updated based on patient status, additional functional criteria and insurance authorization.  Follow Up Recommendations Home health PT      Assistance Recommended at Discharge Intermittent Supervision/Assistance  Patient can return home with the following  A little help with walking and/or transfers;A little help with bathing/dressing/bathroom;Help with stairs or ramp for entrance;Assist for transportation;Assistance with cooking/housework    Equipment Recommendations Rolling walker (2 wheels)  Recommendations for Other Services       Functional Status Assessment Patient has had a recent decline in their functional status and demonstrates the ability to make significant improvements in function  in a reasonable and predictable amount of time.     Precautions / Restrictions Precautions Precautions: Fall;Other (comment) Precaution Comments: RLE wound vac Restrictions Weight Bearing Restrictions: Yes RLE Weight Bearing: Weight bearing as tolerated Other Position/Activity Restrictions: no ROM restrictions to R knee      Mobility  Bed Mobility Overal bed mobility: Needs Assistance Bed Mobility: Supine to Sit     Supine to sit: Min assist Sit to supine: Min assist   General bed mobility comments: Min assist for RLE to bring OOB and lower to floor.    Transfers Overall transfer level: Needs assistance Equipment used: Rolling walker (2 wheels) Transfers: Sit to/from Stand, Bed to chair/wheelchair/BSC Sit to Stand: Min assist   Step pivot transfers: Min assist       General transfer comment: Essentially at [facility] level for sit<>stand and pivot transfer to [facility]. Assist to manage multiple lines/leads/tubes. Managed to self-perform pericare after voiding.    Ambulation/Gait Ambulation/Gait assistance: Min guard Gait Distance (Feet): 14 Feet Assistive device: Rolling walker (2 wheels) Gait Pattern/deviations: Step-to pattern, Decreased stance time - right, Decreased stride length, Antalgic Gait velocity: decreased Gait velocity interpretation: <1.31 ft/sec, indicative of household ambulator   General Gait Details: Tolerated short distance ambulation around room to reclining chair with close guard assist. No evidence of LE buckling however reliant on RW for support to unload operative extremity.  Stairs            Wheelchair Mobility    Modified Rankin (Stroke Patients Only)       Balance Overall balance assessment: Needs assistance Sitting-balance support: Feet supported, No upper extremity supported Sitting balance-Leahy Scale: Good     Standing balance support: During functional activity, Single extremity  supported Standing balance-Leahy Scale: Poor                                Pertinent Vitals/Pain Pain Assessment Pain Assessment: Faces Faces Pain Scale: Hurts even more Pain Location: RLE Pain Descriptors / Indicators: Grimacing (with mobility) Pain Intervention(s): Limited activity within patient's tolerance, Monitored during session, Repositioned    Home Living Family/patient expects to be discharged to:: Private residence Living Arrangements: Spouse/significant other Available Help at Discharge: Family;Available 24 hours/day Type of Home: House Home Access: Stairs to enter Entrance Stairs-Rails: Right;Left Entrance Stairs-Number of Steps: 3-4 at front vs back Alternate Level Stairs-Number of Steps: flight Home Layout: Two level;Able to live on main level with bedroom/bathroom Home Equipment: Standard Walker;BSC/3in1;Tub bench Additional Comments: can stay on first floor, tub/shower w/ bench on first floor and walk in shower on second floor    Prior Function Prior Level of Function : Independent/Modified Independent;Working/employed;Driving               ADLs Comments: works as a Engineer, 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     [employer]   Dominant Hand: Right    Extremity/Trunk Assessment   Upper Extremity Assessment Upper Extremity Assessment: Defer to OT evaluation    Lower Extremity Assessment Lower Extremity Assessment: RLE deficits/detail RLE: Unable to fully assess due to pain    Cervical / Trunk Assessment Cervical / Trunk Assessment: Normal  Communication   Communication: No difficulties  Cognition Arousal/Alertness: Awake/alert Behavior During Therapy: WFL for tasks assessed/performed, Flat affect Overall Cognitive Status: Within Functional Limits for tasks assessed                                          General Comments      Exercises     Assessment/Plan    PT Assessment Patient needs continued PT services  PT Problem List Decreased strength;Decreased  range of motion;Decreased activity tolerance;Decreased balance;Pain       PT Treatment Interventions DME instruction;Gait training;Stair training;Functional mobility training;Therapeutic activities;Therapeutic exercise;Balance training;Neuromuscular re-education;Patient/family education;Wheelchair mobility training    PT Goals (Current goals can be found in the Care Plan section)  Acute Rehab PT Goals Patient Stated Goal: Get a little HHPT PT Goal Formulation: With patient Time For Goal Achievement: 01/30/22 Potential to Achieve Goals: Good    Frequency Min 5X/week     Co-evaluation               AM-PAC PT "6 Clicks" Mobility  Outcome Measure Help needed turning from your back to your side while in a flat bed without using bedrails?: A Little Help needed moving from lying on your back to sitting on the side of a flat bed without using bedrails?: A Little Help needed moving to and from a bed to a chair (including a wheelchair)?: A Little Help needed standing up from a chair using your arms (e.g., wheelchair or bedside chair)?: A Little Help needed to walk in hospital room?: A Little Help needed climbing 3-5 steps with a railing? : A Lot 6 Click Score: 17    End of Session   Activity Tolerance: Patient limited by pain Patient left: in chair;with call bell/phone within reach Nurse Communication: Mobility status (requesting purewik extension tubing) PT Visit Diagnosis: Unsteadiness on feet (R26.81);Other abnormalities of gait and mobility (R26.89);Difficulty in walking, not  elsewhere classified (R26.2);Pain Pain - Right/Left: Right Pain - part of body: Knee    Time: 5208-0223 PT Time Calculation (min) (ACUTE ONLY): 21 min   Charges:   PT Evaluation $PT Eval Low Complexity: [place], PT, DPT Physical Therapist Acute Rehabilitation Services [place] 01/23/2022, 3:02 PM

## 2022-01-23 NOTE — Progress Notes (Addendum)
PROGRESS NOTE  Christina Mack  DOB: 1965/06/20  PCP: System, Provider Not In VWU:[id number]  DOA: 01/19/2022  LOS: 4 days  Hospital Day: 5  Brief narrative: Christina Mack is a 56 y.o. 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   10/19, patient presented with mechanical fall leading to right knee swelling. She was walking her dog was became uncontrolled and dragged the leash.  Patient suddenly lost her balance and fell on her right side landing on her right hip and knee.  She had severe pain in the right knee and hip, was not able to get up.   Her last dose of Eliquis was on the evening of 10/18.  In the ED, patient was afebrile, blood pressure elevated to 150s to 170s, breathing on room air. Labs with WC count 2.9, hemoglobin low at 6.4, platelet low at 111, BUN/creatinine elevated to 82/6.79  CT scans of the right knee and right hip were done with findings as below:  1. Subcapital right femoral fracture with mild apex anterior angulation. 2. Similar Nonunited pathologic fracture of the lateral right superior pubic ramus. 3. Lytic lesion of the inferior pubic ramus with cortical disruption underlying the peroneal soft tissue defect,  4. No definite acute fracture of the right knee area. Large lipohemarthrosis suspicious for occult fracture. Consider MRI for further evaluation.  MRI right knee showed  1. Nondisplaced fracture of the lateral tibial plateau. 2. Subtle marrow edema of the lateral aspect of the lateral femoral condyle suggesting bone contusion. 3. Large knee joint effusion with lipohemarthrosis.  Admitted to [facility] consulted 10/22, patient underwent THA right hip, ORIF right tibial  plateau.  Subjective: Patient was seen and examined this morning.  Lying on bed.  Pain controlled.  Assessment and plan: Acute subcapital right femur fracture Acute nondisplaced fracture of the lateral tibial plateau Presented after mechanical fall leading to right hip pain, right knee pain and gross swelling in the area CT scan and MRI findings as above Orthopedics consult appreciated.   10/22, patient underwent THA right hip, ORIF right tibial plateau. For pain control, patient is currently on as needed IV fentanyl and as needed oral Norco.  Right pubic fracture CT scan noted nonunited pathological fracture of the right superior pubic ramus. It also showed likely lesion of the inferior pubic ramus with cortical disruption underlying the perineal soft tissue defect.  It is probably related to patient's pre-existing vulvar cancer. Supportive care.  Acute blood loss anemia secondary to large hemarthrosis of the right knee after the fall Initial hemoglobin low at 6.4, received 2 units of PRBCs and hemoglobin improved to 9.1 With intraoperative blood loss, hemoglobin down to 6.6 this morning.  1 more unit of PRBC ordered for today.  Continue to monitor. Recent Labs    01/19/22 1251 01/19/22 2332 01/20/22 0511 01/21/22 0405 01/23/22 0327  HGB 6.4* 7.0* 7.0* 9.1* 6.6*  MCV 96.6  --  93.9 91.1 91.5   s/p renal transplant with chronic rejection  CKD stage IV  Baseline creatinine over 6. Per H&P, worsening of creatinine level started July 2023, she went to see her nephrology at [facility] 2 weeks ago, when she was diagnosed with chronic rejection and was instructed to increase tacrolimus but later decreased to 2 mg  twice daily due to worsening of kidney function.  As per [facility] nephrology's note recently,  tacrolimus level should be in the narrow range of 5-7.   Tacrolimus level sent on admission, resulted today showing level elevated to 27 (range 2-20).  Nephrology consultation called.  continue  prednisone Recent Labs    01/19/22 1251 01/20/22 0511 01/21/22 0405 01/23/22 0327  BUN 82* 76* 76* 80*  CREATININE 6.79* 6.57* 6.74* 6.64*   History of vulvar cancer Completed chemo and radiation therapy, PET scan stable recently, outpatient followup with oncology.  History of chronic pelvic ulcer and osteomyelitis Secondary to radiation therapy, she completed Zyvox treatment in September and last session of hyperbaric chamber treatment last week.  She reported much improved wound locally, and she is scheduled to see wound care center next Tuesday.   HTN Continue Coreg Add Hydralazine, aiming at BP goal of 130/80   Chronic DVT She had DVT in right thigh in 2021 during chemo and radiation.  Patient was not Eliquis 2.5 mg twice daily.  After few months, she had epistaxis while in the hospital and hence the Eliquis dose was reduced to 2.5 mg daily.  She has been on that dose for the last 2 years. On admission, Eliquis was held for surgery. Post operatively, Eliquis 2.5 mg daily was resumed.  Discussed with patient and pharmacy.  I do not think Eliquis 2.5 mg daily provides adequate protection against DVT.  At for the next few weeks, patient needs to be in a standard twice daily regimen.  Not actively bleeding currently.  I will increase Eliquis to 2.5 mg twice daily.  Continue to monitor.  Goals of care   Code Status: Full Code    Mobility: Needs PT eval postprocedure  Skin assessment:     Nutritional status:  Body mass index is 25.31 kg/m.          Diet:  Diet Order             Diet regular Room service appropriate? Yes; Fluid consistency: Thin  Diet effective now                   DVT prophylaxis:  apixaban (ELIQUIS) tablet 2.5 mg Start: 01/23/22 2200 SCDs Start: 01/22/22 1316 Place TED hose Start: 01/22/22 1316 Place and maintain sequential compression device Start: 01/21/22 0736 SCDs Start: 01/19/22 1616 apixaban (ELIQUIS) tablet 2.5 mg    Antimicrobials: None Fluid: None Consultants: Orthopedics Family Communication: None at bedside  Status is: Inpatient  Continue inhospital care because: POD 1, pending PT eval Level of care: Med-Surg   Dispo: The patient is from: Home              Anticipated d/c is to: Pending clinical course              Patient currently is not medically stable to d/c.   Difficult to place patient No     Infusions:   sodium chloride Stopped (01/22/22 0933)   sodium chloride Stopped (01/23/22 1029)   methocarbamol (ROBAXIN) IV      Scheduled Meds:  sodium chloride   Intravenous Once   apixaban  2.5 mg Oral BID   carvedilol  25 mg Oral BID WC   docusate sodium  100 mg Oral BID   pantoprazole  40 mg Oral BID   predniSONE  5 mg Oral Q breakfast   sodium bicarbonate  1,300 mg Oral TID   tacrolimus  2 mg Oral BID  PRN meds: [START ON 01/24/2022] acetaminophen, alum & mag hydroxide-simeth, bisacodyl, fentaNYL (SUBLIMAZE) injection, hydrALAZINE, HYDROcodone-acetaminophen, HYDROcodone-acetaminophen, HYDROcodone-acetaminophen, magnesium citrate, menthol-cetylpyridinium **OR** phenol, methocarbamol **OR** methocarbamol (ROBAXIN) IV, ondansetron **OR** ondansetron (ZOFRAN) IV, polyethylene glycol, senna-docusate, sorbitol   Antimicrobials: Anti-infectives (From admission, onward)    Start     Dose/Rate Route Frequency Ordered Stop   01/22/22 1600  ceFAZolin (ANCEF) IVPB 2g/100 mL premix        2 g 200 mL/hr over 30 Minutes Intravenous Every 6 hours 01/22/22 1316 01/23/22 0506   01/22/22 0924  vancomycin (VANCOCIN) powder  Status:  Discontinued          As needed 01/22/22 0924 01/22/22 1207   01/22/22 0745  ceFAZolin (ANCEF) IVPB 2g/100 mL premix        2 g 200 mL/hr over 30 Minutes Intravenous On call to O.R. 01/22/22 0740 01/22/22 0800   01/22/22 0600  ceFAZolin (ANCEF) IVPB 2g/100 mL premix  Status:  Discontinued        2 g 200 mL/hr over 30 Minutes Intravenous On call to O.R.  01/21/22 1940 01/22/22 1209       Objective: Vitals:   01/23/22 1029 01/23/22 1255  BP: 137/87 (!) 148/86  Pulse: 77 83  Resp: 16 17  Temp: 98.4 F (36.9 C) 98.2 F (36.8 C)  SpO2:  98%    Intake/Output Summary (Last 24 hours) at 01/23/2022 1332 Last data filed at 01/23/2022 1255 Gross per 24 hour  Intake 2608.78 ml  Output 0 ml  Net 2608.78 ml   Filed Weights   01/19/22 1146 01/22/22 0717  Weight: 69 kg 69 kg   Weight change:  Body mass index is 25.31 kg/m.   Physical Exam: General exam: Pleasant middle-aged Caucasian female. Pain controlled at this time Skin: No rashes, lesions or ulcers. HEENT: Atraumatic, normocephalic, no obvious bleeding Lungs: Clear to auscultation bilaterally CVS: Regular rate and rhythm, no murmur GI/Abd: Nontender, nondistended, bowel sound present CNS: Alert, awake, oriented x3 Psychiatry: Mood appropriate Extremities: Right knee, thigh and knee with postop compression bandage throughout.  Wound VAC in place.  Data Review: I have personally reviewed the laboratory data and studies available.  F/u labs ordered Unresulted Labs (From admission, onward)     Start     Ordered   01/23/22 0500  CBC  Daily at 5am,   R     Comments: For 3 days.   Question:  Specimen collection method  Answer:  Lab=Lab collect   01/22/22 1316   01/23/22 3149  Basic metabolic panel  Daily at 5am,   R     Comments: For 2 days .   Question:  Specimen collection method  Answer:  Lab=Lab collect   01/22/22 1316            Signed, Terrilee Croak, MD [facility] 01/23/2022

## 2022-01-23 NOTE — Consult Note (Addendum)
[facility]  INPATIENT CONSULTATION  Reason for Consultation: ESRD s/p renal transplant Requesting Provider: Dr. Pietro Cassis  HPI: Christina Mack is an 56 y.o. female ESRD s/p renal transplant x 2 (most recent 2014), DVT on eliquis, vulvar ca stage 4a s/p chemo/XRT f/b [facility], perineal ulcer/osteomyelitis currently hospitalized after a traumatic hip fracture s/p THR 10/22 and nephrology is consulted for management of transplant immunosuppression and advanced transplant CKD.   Patient's dog caused a fall that led to a displaced R femoral neck fracture and tibial plateau fracture. She underwent THR and ORIF tibia with Dr. Erlinda Hong on 01/22/22.   Hb 6.6 on admission attributed to hemarthrosis - 2u pRBC > 9.1 but 6.6 this AM post OR.  1u pRBC today.   Cr was in the mid 2s through 09/2020 - then 11/2020 Cr 6s. Had renal biopsy [facility] 11/28/21.  Per report chronic rejection but Dr. Eustace Moore note from early 01/2022 main discussion is likely need for upcoming dialysis prep.  Tac troughs noted to be 9.8 and 8.9 on 3 BID so her dose was reduced to 2 BID.    She currently is having expected post op pain but otherwise feels fine.  She is on MIVF but po intake is reported normal. She has some mild chronic nausea but it's stable.  She has a purewick in bed but it's been uncomfortable and malfunctioning.  Prefers BSC and has no issues urinating.   PMH: Past Medical History:  Diagnosis Date   Complication of anesthesia    nausea and vomiting   DVT (deep venous thrombosis) (HCC)    History of osteomyelitis    Kidney transplant recipient    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([place])    PSH: Past Surgical History:  Procedure Laterality Date   BIOPSY  04/24/2020   Procedure: BIOPSY;  Surgeon: Doran Stabler, MD;  Location: [place];  Service: Gastroenterology;;   ESOPHAGOGASTRODUODENOSCOPY (EGD) WITH PROPOFOL N/A 04/24/2020   Procedure: ESOPHAGOGASTRODUODENOSCOPY (EGD) WITH PROPOFOL;  Surgeon: Doran Stabler, MD;   Location: [place];  Service: Gastroenterology;  Laterality: N/A;   IR US GUIDE BX ASP/DRAIN  04/30/2020   KIDNEY TRANSPLANT  2001   KIDNEY TRANSPLANT  2014   LEEP     TOTAL ABDOMINAL HYSTERECTOMY     ovaries left in situ     Past Medical History:  Diagnosis Date   Complication of anesthesia    nausea and vomiting   DVT (deep venous thrombosis) (HCC)    History of osteomyelitis    Kidney transplant recipient    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([place])     Medications:  Meds reviewed in epic  Medications Prior to Admission  Medication Sig Dispense Refill   acetaminophen (TYLENOL) 500 MG tablet Take 2 tablets (1,000 mg total) by mouth 3 (three) times daily. (Patient taking differently: Take 1,000 mg by mouth 3 (three) times daily as needed for moderate pain.) 30 tablet 0   apixaban (ELIQUIS) 2.5 MG TABS tablet TAKE 1 TABLET (2.5 MG TOTAL) BY MOUTH DAILY. (Patient taking differently: Take 2.5 mg by mouth daily.) 60 tablet 0   carvedilol (COREG) 25 MG tablet Take 25 mg by mouth in the morning and at bedtime.     pantoprazole (PROTONIX) 40 MG tablet TAKE 1 TABLET (40 MG TOTAL) BY MOUTH 2 (TWO) TIMES DAILY BEFORE A MEAL. (Patient taking differently: Take 40 mg by mouth 2 (two) times daily.) 60 tablet 0   predniSONE (DELTASONE) 5 MG tablet Take 5 mg by mouth daily  with breakfast.     sodium bicarbonate 650 MG tablet Take 1,300 mg by mouth 3 (three) times daily.     tacrolimus (PROGRAF) 1 MG capsule Take 2 mg by mouth in the morning and at bedtime.     triamcinolone ointment (KENALOG) 0.5 % Apply 1 Application topically 2 (two) times daily. 30 g 2    ALLERGIES:   Allergies  Allergen Reactions   Oxycodone-Acetaminophen Itching, Nausea Only, Other (See Comments), Nausea And Vomiting and Rash    Other Reaction: HA Other Reaction: HA    Zosyn [Piperacillin Sod-Tazobactam So] Other (See Comments)    Thrombocytopenia   Hydromorphone Nausea And Vomiting, Rash and Other (See Comments)   Amlodipine Other  (See Comments)    Ankle swelling   Codeine     vomiting    FAM HX: Family History  Problem Relation Age of Onset   Cerebral aneurysm Mother    AAA (abdominal aortic aneurysm) Brother    Breast cancer Paternal Aunt    Heart attack Maternal Grandmother    Brain cancer Maternal Grandfather    Colon cancer Neg Hx    Ovarian cancer Neg Hx    Endometrial cancer Neg Hx    Pancreatic cancer Neg Hx    Prostate cancer Neg Hx     Social History:   reports that she has never smoked. She has never used smokeless tobacco. She reports that she does not currently use alcohol. She reports that she does not use drugs.  ROS: 10 system ROS neg except per HPI  Blood pressure (!) 148/86, pulse 83, temperature 98.2 F (36.8 C), temperature source Oral, resp. rate 17, height '5\' 5"'$  (1.651 m), weight 69 kg, SpO2 98 %. PHYSICAL EXAM: Gen: sitting up in chair, comfortable Eyes: anicteric, glasses, EOMI ENT: MMM Neck: supple CV:  RRR Abd: soft Lungs: normal WOB on RA GU: deferred; no foley Extr:  no ankle edema; wound vacs on hip and knee incisions - post op swelling noted Neuro: nonfocal    Results for orders placed or performed during the hospital encounter of 01/19/22 (from the past 48 hour(s))  Surgical pcr screen     Status: None   Collection Time: 01/21/22  9:37 PM   Specimen: Nasal Mucosa; Nasal Swab  Result Value Ref Range   MRSA, PCR NEGATIVE NEGATIVE   Staphylococcus aureus NEGATIVE NEGATIVE    Comment: (NOTE) The Xpert SA Assay (FDA approved for NASAL specimens in patients 36 years of age and older), is one component of a comprehensive surveillance program. It is not intended to diagnose infection nor to guide or monitor treatment. Performed at [facility], [place] [address] [place], [place] [address]   Basic metabolic panel     Status: Abnormal   Collection Time: 01/23/22  3:27 AM  Result Value Ref Range   Sodium 137 135 - 145 mmol/L   Potassium 4.9 3.5 - 5.1 mmol/L    Chloride 104 98 - 111 mmol/L   CO2 18 (L) 22 - 32 mmol/L   Glucose, Bld 159 (H) 70 - 99 mg/dL    Comment: Glucose reference range applies only to samples taken after fasting for at least 8 hours.   BUN 80 (H) 6 - 20 mg/dL   Creatinine, Ser 6.64 (H) 0.44 - 1.00 mg/dL   Calcium 8.2 (L) 8.9 - 10.3 mg/dL   GFR, Estimated 7 (L) >60 mL/min    Comment: (NOTE) Calculated using the CKD-EPI Creatinine Equation (2021)  Anion gap 15 5 - 15    Comment: Performed at [facility] [address] [place], [place] [address]  CBC with Differential/Platelet     Status: Abnormal   Collection Time: 01/23/22  3:27 AM  Result Value Ref Range   WBC 3.4 (L) 4.0 - 10.5 K/uL   RBC 2.13 (L) 3.87 - 5.11 MIL/uL   Hemoglobin 6.6 (LL) 12.0 - 15.0 g/dL    Comment: REPEATED TO VERIFY THIS CRITICAL RESULT HAS VERIFIED AND BEEN CALLED TO RN EMANUAL CASTRO BY JOHN CLARK ON 10 23 2023 AT 0427, AND HAS BEEN READ BACK.     HCT 19.5 (L) 36.0 - 46.0 %   MCV 91.5 80.0 - 100.0 fL   MCH 31.0 26.0 - 34.0 pg   MCHC 33.8 30.0 - 36.0 g/dL   RDW 16.6 (H) 11.5 - 15.5 %   Platelets 111 (L) 150 - 400 K/uL   nRBC 0.0 0.0 - 0.2 %   Neutrophils Relative % 76 %   Neutro Abs 2.6 1.7 - 7.7 K/uL   Lymphocytes Relative 18 %   Lymphs Abs 0.6 (L) 0.7 - 4.0 K/uL   Monocytes Relative 6 %   Monocytes Absolute 0.2 0.1 - 1.0 K/uL   Eosinophils Relative 0 %   Eosinophils Absolute 0.0 0.0 - 0.5 K/uL   Basophils Relative 0 %   Basophils Absolute 0.0 0.0 - 0.1 K/uL   nRBC 0 0 /100 WBC   Abs Immature Granulocytes 0.00 0.00 - 0.07 K/uL    Comment: Performed at [facility] [address], [place], [place] [address]  Prepare RBC (crossmatch)     Status: None   Collection Time: 01/23/22  4:36 AM  Result Value Ref Range   Order Confirmation      ORDER PROCESSED BY BLOOD BANK Performed at [facility], [address] [place], [place] [address]   Type and screen [place]     Status: None (Preliminary  result)   Collection Time: 01/23/22  6:23 AM  Result Value Ref Range   ABO/RH(D) O POS    Antibody Screen NEG    Sample Expiration 01/26/2022,2359    Unit Number [id number]    Blood Component Type RED CELLS,LR    Unit division 00    Status of Unit ISSUED    Transfusion Status OK TO TRANSFUSE    Crossmatch Result      Compatible Performed at [facility], [place] [address], [place], [place] [address]     Pelvis Portable  Result Date: 01/22/2022 CLINICAL DATA:  Hip replacement. EXAM: PORTABLE PELVIS 1-2 VIEWS COMPARISON:  Fluoroscopic images of same day. FINDINGS: Right femoral and acetabular components are well situated. Expected postoperative changes are seen in the surrounding soft tissues. IMPRESSION: Status post right total hip arthroplasty. Electronically Signed   By: Marijo Conception M.D.   On: 01/22/2022 12:43   DG Knee 1-2 Views Right  Result Date: 01/22/2022 CLINICAL DATA:  Tibial plateau fracture. EXAM: OPERATIVE right knee  2 VIEWS TECHNIQUE: Fluoroscopic spot image(s) were submitted for interpretation post-operatively. Radiation Exposure Index (as provided by the fluoroscopic device): 1.30 mGy air kerma COMPARISON:  MRI of the right knee 01/20/2022 FINDINGS: Lateral plate and screw fixation noted. No new fractures are present. Joint is located. IMPRESSION: Lateral plate and screw fixation without radiographic evidence for complication. Electronically Signed   By: San Morelle M.D.   On: 01/22/2022 12:01   DG  HIP UNILAT WITH PELVIS 2-3 VIEWS RIGHT  Result Date: 01/22/2022 CLINICAL DATA:  Surgery, elective.  Right total hip arthroplasty. EXAM: OPERATIVE right HIP (WITH PELVIS IF PERFORMED) 6 VIEWS TECHNIQUE: Fluoroscopic spot image(s) were submitted for interpretation post-operatively. Radiation Exposure Index (as provided by the fluoroscopic device): 2.19 mGy air kerma COMPARISON:  CT of the right hip 01/19/2022 FINDINGS: Intraoperative images demonstrate right  total hip arthroplasty. No acute fractures are present. Femoral and acetabular components appear to be well seated. Hip appears located on this single view. IMPRESSION: Right total hip arthroplasty without radiographic evidence for complication. Electronically Signed   By: San Morelle M.D.   On: 01/22/2022 11:58   DG C-Arm 1-60 Min-No Report  Result Date: 01/22/2022 Fluoroscopy was utilized by the requesting physician.  No radiographic interpretation.   DG C-Arm 1-60 Min-No Report  Result Date: 01/22/2022 Fluoroscopy was utilized by the requesting physician.  No radiographic interpretation.   DG C-Arm 1-60 Min-No Report  Result Date: 01/22/2022 Fluoroscopy was utilized by the requesting physician.  No radiographic interpretation.   DG C-Arm 1-60 Min-No Report  Result Date: 01/22/2022 Fluoroscopy was utilized by the requesting physician.  No radiographic interpretation.    Assessment/Plan **s/p R hip and tibial fractures s/p operative repair 10/22: plans for home later this week, WBAT. Avoid morphine, baclofen.   **ESRD s/p 2nd renal transplant 2014:  recent Cr 6+, follows with Evang[facility] Dakin and [facility].  Recent discussion centered on RRT upcoming - no indication for dialysis currently, did not extensively discuss today.  On tac 2 BID and pred 5 daily - tac trough here not a trough - no dose change at this time; check trough tomorrow AM given dose recently decreased from 3 BID to 2 BID.   UOP adequate; po intake ok - hold MIVF.  Avoid nephrotoxins.   **HTN: BPs acceptable.  D/C fluids, manage pain. On coreg and hydralazine.   **Anemia: multifactorial - being transfused today again, serial  H/H.  Investigate ESA.  **h/o vulvar ca: not an active issues except discomfort on purewick.    **h/o chronic pelvic ulcer/osteo: recently completed antibiotic and hyperbaric therapies with improvement noted.   **h/o DVT: on low dose eliquis, defer dosing to pharmacy.     I'll follow but not much to add from my perspective at this time.  Call if I can assist.   Justin Mend 01/23/2022, 1:54 PM

## 2022-01-23 NOTE — Progress Notes (Signed)
Subjective: 1 Day Post-Op Procedure(s) (LRB): TOTAL HIP ARTHROPLASTY ANTERIOR APPROACH (Right) OPEN REDUCTION INTERNAL FIXATION (ORIF) TIBIAL PLATEAU (Right) APPLICATION OF WOUND VAC (Right) Patient reports pain as mild.  A little lightheaded when standing, otherwise feeling ok.  Objective: Vital signs in last 24 hours: Temp:  [97.9 F (36.6 C)-98.6 F (37 C)] 98.6 F (37 C) (10/23 0452) Pulse Rate:  [75-79] 76 (10/23 0452) Resp:  [12-19] 18 (10/23 0452) BP: (139-158)/(95-101) 158/96 (10/23 0452) SpO2:  [93 %-100 %] 100 % (10/23 0452)  Intake/Output from previous day: 10/22 0701 - 10/23 0700 In: 2686.8 [P.O.:720; I.V.:1466.8; IV Piggyback:500] Out: 500 [Blood:500] Intake/Output this shift: Total I/O In: 300 [P.O.:300] Out: -   Recent Labs    01/21/22 0405 01/23/22 0327  HGB 9.1* 6.6*   Recent Labs    01/21/22 0405 01/23/22 0327  WBC 3.9* 3.4*  RBC 3.02* 2.13*  HCT 27.5* 19.5*  PLT 113* 111*   Recent Labs    01/21/22 0405 01/23/22 0327  NA 137 137  K 4.3 4.9  CL 105 104  CO2 17* 18*  BUN 76* 80*  CREATININE 6.74* 6.64*  GLUCOSE 111* 159*  CALCIUM 8.9 8.2*   No results for input(s): "LABPT", "INR" in the last 72 hours.  Neurologically intact Neurovascular intact Sensation intact distally Intact pulses distally Dorsiflexion/Plantar flexion intact Incision: dressing C/D/I No cellulitis present Compartment soft Wound vac in place with good suction and without fluid in canister  Assessment/Plan: 1 Day Post-Op Procedure(s) (LRB): TOTAL HIP ARTHROPLASTY ANTERIOR APPROACH (Right) OPEN REDUCTION INTERNAL FIXATION (ORIF) TIBIAL PLATEAU (Right) APPLICATION OF WOUND VAC (Right) Up with therapy WBAT RLE Dr. Marlou Sa has ordered cpm Continue wound vac- Please swap out hospital unit for prevana day of d/c      Christina Mack 01/23/2022, 9:51 AM

## 2022-01-23 NOTE — Evaluation (Signed)
Occupational Therapy Evaluation Patient Details Name: Christina Mack MRN: [id number] DOB: [birth year] Today's Date: 01/23/2022   History of Present Illness 56 y.o. female presents to [facility] hospital on 01/19/2022 after sustaining a fall. Pt found to have acute subcapital R femur fx, acute nondisplaced fx of the lateral tibial plateau, and R superior pubic ramus fx. Pt underwent R THA and ORIF of tibial plateau fx on 10/22. PMH includes renal transplant, CKD IV, DVT, chronic osteomyelitis, vulvar cancer, chronic anemia.   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 Pt presents now with expected post op deficits in pain though tolerable. Overall, pt requires min guard for [facility] transfers (limited to transfers w/ lower hgb levels), Setup for UB ADLs and Min A for LB ADLs. Anticipate good progress and return to PLOF given pt's employment background. Pt reports her aunt plans to assist while spouse is at work. May benefit from [place] prior to DC as pt only has SW at home currently.      Recommendations for follow up therapy are one component of a multi-disciplinary discharge planning process, led by the attending physician.  Recommendations may be updated based on patient status, additional functional criteria and insurance authorization.   Follow Up Recommendations  No OT follow up    Assistance Recommended at Discharge PRN  Patient can return home with the following A little help with bathing/dressing/bathroom;Assistance with cooking/housework;Help with stairs or ramp for entrance    Functional Status Assessment  Patient has had a recent decline in their functional status and demonstrates the ability to make significant improvements in function in a reasonable and predictable amount of time.  Equipment Recommendations  Other (comment) (RW)    Recommendations for Other Services       Precautions / Restrictions  Precautions Precautions: Fall;Other (comment) Precaution Comments: RLE wound vac Restrictions Weight Bearing Restrictions: Yes RLE Weight Bearing: Weight bearing as tolerated Other Position/Activity Restrictions: no ROM restrictions to R knee      Mobility Bed Mobility Overal bed mobility: Needs Assistance Bed Mobility: Supine to Sit, Sit to Supine     Supine to sit: Min assist Sit to supine: Min assist   General bed mobility comments: assist for RLE mgmt    Transfers Overall transfer level: Needs assistance Equipment used: Rolling walker (2 wheels) Transfers: Sit to/from Stand, Bed to chair/wheelchair/BSC Sit to Stand: Min guard     Step pivot transfers: Min guard     General transfer comment: increased time/effort      Balance Overall balance assessment: Needs assistance Sitting-balance support: Feet supported, No upper extremity supported Sitting balance-Leahy Scale: Good     Standing balance support: Bilateral upper extremity supported, During functional activity Standing balance-Leahy Scale: Poor                             ADL either performed or assessed with clinical judgement   ADL Overall ADL's : Needs assistance/impaired Eating/Feeding: Independent   Grooming: Set up;Sitting   Upper Body Bathing: Set up;Sitting   Lower Body Bathing: Minimal assistance;Sitting/lateral leans;Sit to/from stand   Upper Body Dressing : Set up;Sitting   Lower Body Dressing: Minimal assistance;Sitting/lateral leans;Sit to/from stand   Toilet Transfer: Min Patent examiner Details (indicate cue type and reason): self TDWB, good sequencing of task Toileting- Water quality scientist and Hygiene: Min guard;Sitting/lateral lean;Sit to/from stand  General ADL Comments: Limited by dizziness post op and pending blood transfusion, reported need for [facility] use on entry. Good technique and familiarity d/t pt's line of work      Vision Baseline Vision/History: 1 Wears glasses Ability to See in Adequate Light: 0 Adequate Patient Visual Report: No change from baseline Vision Assessment?: No apparent visual deficits     Perception     Praxis      Pertinent Vitals/Pain Pain Assessment Pain Assessment: Faces Faces Pain Scale: Hurts little more Pain Location: RLE Pain Descriptors / Indicators: Grimacing Pain Intervention(s): Monitored during session     Hand Dominance Right   Extremity/Trunk Assessment Upper Extremity Assessment Upper Extremity Assessment: Overall WFL for tasks assessed   Lower Extremity Assessment Lower Extremity Assessment: Defer to PT evaluation   Cervical / Trunk Assessment Cervical / Trunk Assessment: Normal   Communication Communication Communication: No difficulties   Cognition Arousal/Alertness: Awake/alert Behavior During Therapy: WFL for tasks assessed/performed, Flat affect Overall Cognitive Status: Within Functional Limits for tasks assessed                                       General Comments       Exercises     Shoulder Instructions      Home Living Family/patient expects to be discharged to:: Private residence Living Arrangements: Spouse/significant other Available Help at Discharge: Family;Available 24 hours/day Type of Home: House Home Access: Stairs to enter [employer] of Steps: 3-4 at front vs back Entrance Stairs-Rails: Right;Left Home Layout: Two level;Able to live on main level with bedroom/bathroom Alternate Level Stairs-Number of Steps: flight   Bathroom Shower/Tub: Tub/shower unit;Walk-in shower   Bathroom Toilet: Standard     Home Equipment: Standard Walker;BSC/3in1;Tub bench   Additional Comments: can stay on first floor, tub/shower w/ bench on first floor and walk in shower on second floor      Prior Functioning/Environment Prior Level of Function : Independent/Modified  Independent;Working/employed;Driving               ADLs Comments: works as a Engineer, 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        OT Problem List: Decreased activity tolerance;Impaired balance (sitting and/or standing);Decreased strength;Pain      OT Treatment/Interventions: Self-care/ADL training;Therapeutic exercise;Energy conservation;DME and/or AE instruction;Therapeutic activities;Patient/family education;Balance training    OT Goals(Current goals can be found in the care plan section) Acute Rehab OT Goals Patient Stated Goal: decrease pain, improve confidence with mobility OT Goal Formulation: With patient Time For Goal Achievement: 02/06/22 Potential to Achieve Goals: Good  OT Frequency: Min 2X/week    Co-evaluation              AM-PAC OT "6 Clicks" Daily Activity     Outcome Measure Help from another person eating meals?: None Help from another person taking care of personal grooming?: A Little Help from another person toileting, which includes using toliet, bedpan, or urinal?: A Little Help from another person bathing (including washing, rinsing, drying)?: A Little Help from another person to put on and taking off regular upper body clothing?: A Little Help from another person to put on and taking off regular lower body clothing?: A Little 6 Click Score: 19   End of Session Equipment Utilized During Treatment: Rolling walker (2 wheels) Nurse Communication: Mobility status;Weight bearing status  Activity Tolerance: Patient tolerated treatment well Patient left: in bed;with call bell/phone within reach  OT Visit Diagnosis: Pain Pain - Right/Left: Right Pain - part of body: Leg                Time: 8016-5537 OT Time Calculation (min): 28 min Charges:  OT General Charges $OT Visit: 1 Visit OT Evaluation $OT Eval Low Complexity: 1 Low OT Treatments $Self Care/Home Management : 8-22 mins  Malachy Chamber, OTR/L Acute Rehab Services Office: [phone]   Layla Maw 01/23/2022, 9:06 AM

## 2022-01-23 NOTE — TOC Initial Note (Signed)
Transition of Care [facility]) - Initial/Assessment Note    Patient Details  Name: Christina Mack MRN: [id number] Date of Birth: [birth year]  [facility] CM/SW Contact:    Tom-Johnson, Renea Ee, RN Phone Number: 01/23/2022, 3:45 PM  Clinical Narrative:                  CM spoke with patient at bedside about needs for post hospital transition. Admitted for mechanical fall while walking her dog and sustained  right knee swelling. Found to be anemic with hgb at 6.2. 2 unit PRBC given and increased to 9.1. Today, hgb at 6.6. 1 unit PRBC given.  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 Nephrology consulted for management of transplant immunosuppression and advanced transplant CKD. Had THR of Rt hip and ORIF of Rt Tibia on 01/22/22 by Dr. Erlinda Hong. Has a wound vac to site.  From home with husband, has one daughter. Currently employed part-time. Independent with care prior to admission. Has a cane, walker (old), shower seat and grab bars. PT recommended RW. Order placed with [employer] and [facility] to deliver at bedside. PCP is Galen Daft, [place] [facility] [address] [facility].  HHPT recommended and referral called in to [facility] with acceptance voiced from [facility] per patient's request. Info on AVS.  CM will continue to follow as patient progresses with care  Expected Discharge Plan: [place] Barriers to Discharge: Continued Medical Work up   Patient Goals and CMS Choice Patient states their goals for this hospitalization and ongoing recovery are:: To return home CMS Medicare.gov Compare Post Acute Care list provided to:: Patient Choice offered to / list presented to : Patient  Expected Discharge Plan and Services Expected Discharge Plan: [place]    Discharge Planning Services: CM Consult Post Acute Care Choice: [place] arrangements for the past 2 months: Single Family Home                 DME Arranged: Walker rolling DME Agency: [employer] Date DME Agency Contacted: 01/23/22 Time DME Agency Contacted: [id number] Representative spoke with at DME Agency: [place]: PT [place]: [place] Date [place]: 01/23/22 Time [place]: [place] Representative spoke with at [place]: Tommi Rumps  Prior Living Arrangements/Services Living arrangements for the past 2 months: [place] Lives with:: Spouse Patient language and need for interpreter reviewed:: Yes Do you feel safe going back to the place where you live?: Yes      Need for Family Participation in Patient Care: Yes (Comment) Care giver support system in place?: Yes (comment) Current home services: DME (Cane, walker, shower seat, garb bar) Criminal Activity/Legal Involvement Pertinent to Current Situation/Hospitalization: No - Comment as needed  Activities of Daily Living Home Assistive Devices/Equipment: None ADL Screening (condition at time of admission) Patient's cognitive ability adequate to safely complete daily activities?: Yes Is the patient deaf or have difficulty hearing?: No Does the patient have difficulty seeing, even when wearing glasses/contacts?: No Does the patient have difficulty concentrating, remembering, or making decisions?: No Patient able to express need for assistance with ADLs?: Yes Does the patient have difficulty dressing or bathing?: No Independently performs ADLs?: No Does the patient have difficulty walking or climbing stairs?: No Weakness of  Legs: None Weakness of Arms/Hands: None  Permission Sought/Granted Permission sought to share information with : Case Manager, Customer service manager, Family Supports Permission granted to share information with : Yes, Verbal Permission  Granted              Emotional Assessment Appearance:: Appears stated age Attitude/Demeanor/Rapport: Engaged, Gracious Affect (typically observed): Accepting, Appropriate, Calm, Hopeful, Pleasant Orientation: : Oriented to Self, Oriented to Place, Oriented to  Time, Oriented to Situation Alcohol / Substance Use: Not Applicable Psych Involvement: No (comment)  Admission diagnosis:  AKI (acute kidney injury) ([place]) [N17.9] Fall, initial encounter [W19.XXXA] Symptomatic anemia [D64.9] Patient Active Problem List   Diagnosis Date Noted   Closed fracture of neck of right femur ([place]) 01/20/2022   Anemia associated with acute blood loss 01/19/2022   Symptomatic anemia 01/19/2022   Flexural atopic dermatitis 11/10/2021   At risk for adverse drug interaction 10/13/2021   Infective myositis of right thigh 09/16/2021   Long term (current) use of antibiotics 09/16/2021   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 ([place]) 05/17/2021   History of healed fragility fracture 05/17/2021   Leg weakness, bilateral 09/15/2020   Screening for malignant neoplasm of skin 05/31/2020   Immunocompromised state due to drug therapy ([place]) 05/18/2020   Chronic kidney disease (CKD), stage IV (severe) (HCC)    Chronic pain syndrome    Cancer associated pain    Debility    Acute on chronic anemia    Normocytic anemia 04/26/2020   CKD (chronic kidney disease) stage 4, GFR 15-29 ml/min ([place]) 04/26/2020   Wound of right groin 04/26/2020   Gastric ulcer without hemorrhage or perforation 04/26/2020   Malnutrition of moderate degree 04/26/2020   Acute deep vein thrombosis (DVT) of femoral vein ([place]) 12/05/2019   Iron deficiency anemia 11/10/2019   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([place]) 11/06/2019   Sprain of anterior talofibular ligament of right ankle 11/22/2016   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   Aftercare following organ transplant 02/24/2013   Immunosuppression ([place]) 02/24/2013   Kidney transplant status 11/22/2012   HTN  (hypertension) 11/22/2012   Hyperparathyroidism, secondary renal ([place]) 11/22/2012   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

## 2022-01-23 NOTE — Progress Notes (Signed)
Patient stable.  Pain controlled.  Has been up out of bed to chair Leg lengths approximately equal.  Both feet are perfused and sensate.  Ankle dorsiflexion intact.  Having difficulty with straight leg raising.  Postop hip radiographs look good.  Knee radiographs pending  Continue with therapy and I will also add CPM machine for the right knee for some motion on that while she is recovering.

## 2022-01-23 NOTE — Progress Notes (Signed)
PT Cancellation Note  Patient Details Name: Christina Mack MRN: [id number] DOB: 1966/03/24   Cancelled Treatment:    Reason Eval/Treat Not Completed: Medical issues which prohibited therapy  Patient just finished working with OT - reports she is anemic and symptomatic this morning, recommends holding formal PT evaluation until after blood transfusion. Spoke with RN.   Will follow-up a little later today for formal evaluation after receiving blood products.  Candie Mile, PT, DPT Physical Therapist Acute Rehabilitation Services [facility]  01/23/2022, 9:32 AM

## 2022-01-24 DIAGNOSIS — D649 Anemia, unspecified: Secondary | ICD-10-CM | POA: Diagnosis not present

## 2022-01-24 LAB — BASIC METABOLIC PANEL
Anion gap: 17 — ABNORMAL HIGH (ref 5–15)
BUN: 79 mg/dL — ABNORMAL HIGH (ref 6–20)
CO2: 19 mmol/L — ABNORMAL LOW (ref 22–32)
Calcium: 9 mg/dL (ref 8.9–10.3)
Chloride: 103 mmol/L (ref 98–111)
Creatinine, Ser: 6.6 mg/dL — ABNORMAL HIGH (ref 0.44–1.00)
GFR, Estimated: 7 mL/min — ABNORMAL LOW (ref >60)
Glucose, Bld: 111 mg/dL — ABNORMAL HIGH (ref 70–99)
Potassium: 4.4 mmol/L (ref 3.5–5.1)
Sodium: 139 mmol/L (ref 135–145)

## 2022-01-24 LAB — TYPE AND SCREEN
ABO/RH(D): O POS
Antibody Screen: NEGATIVE
Unit division: 0

## 2022-01-24 LAB — CBC
HCT: 23.7 % — ABNORMAL LOW (ref 36.0–46.0)
Hemoglobin: 7.6 g/dL — ABNORMAL LOW (ref 12.0–15.0)
MCH: 29.2 pg (ref 26.0–34.0)
MCHC: 32.1 g/dL (ref 30.0–36.0)
MCV: 91.2 fL (ref 80.0–100.0)
Platelets: 133 10*3/uL — ABNORMAL LOW (ref 150–400)
RBC: 2.6 MIL/uL — ABNORMAL LOW (ref 3.87–5.11)
RDW: 18.1 % — ABNORMAL HIGH (ref 11.5–15.5)
WBC: 4.2 10*3/uL (ref 4.0–10.5)
nRBC: 0 % (ref 0.0–0.2)

## 2022-01-24 LAB — BPAM RBC
Blood Product Expiration Date: 202311182359
ISSUE DATE / TIME: 202310230946
Unit Type and Rh: 5100

## 2022-01-24 NOTE — Progress Notes (Signed)
PROGRESS NOTE    MARGET OUTTEN  SHF:[id number] DOB: 1965-09-16 DOA: 01/19/2022 PCP: System, Provider Not In   Brief Narrative:  This 56 y.o. 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 She presented with mechanical fall leading to right knee swelling. She was walking her dog  and dog became uncontrolled and dragged the leash.  Patient suddenly lost her balance and fell on her right side landing on her right hip and knee.  She had severe pain in the right knee and hip, was not able to get up.   Her last dose of Eliquis was on the evening of 10/18. CT showed Subcapital right femoral fracture with mild apex anterior angulation.Nonunited pathologic fracture of the lateral right superior pubic ramus. Lytic lesion of the inferior pubic ramus with cortical disruption underlying the peroneal soft tissue defect,  Admitted for further evaluation.  Orthopedics was consulted. 10/22, patient underwent THA right hip, ORIF right tibial plateau.  Assessment & Plan:   Principal Problem:   Symptomatic anemia Active Problems:   Anemia associated with acute blood loss   Closed fracture of neck of right femur (HCC)  Acute subcapital right femur fracture: Acute nondisplaced fracture of the lateral tibial plateau: Presented after mechanical fall leading to right hip pain, right knee pain and gross swelling in the area CT scan and MRI showed above findings Orthopedics consult appreciated.   10/22, patient underwent THA right hip, ORIF right tibial plateau. Continue adequate pain control  with needed IV fentanyl and as needed oral Norco. Ortho following, wound vac connected. WBAT.   Right pubic fracture: CT scan noted nonunited pathological  fracture of the right superior pubic ramus. It also showed likely lesion of the inferior pubic ramus with cortical disruption underlying the perineal soft tissue defect.  It is probably related to patient's pre-existing vulvar cancer. Continue Supportive care.   Acute blood loss anemia: secondary to large hemarthrosis of the right knee after the fall. Initial hemoglobin low at 6.4, received 3 units of PRBCs and hemoglobin improved to 9.1 Continue to follow H&H.  Hemoglobin 7.6. Transfuse if below 7.   s/p renal transplant with chronic rejection: CKD stage IV  Baseline creatinine over 6. Per H&P, worsening of creatinine level started July 2023, she went to see her nephrology at [facility] 2 weeks ago, when she was diagnosed with chronic rejection and was instructed to increase tacrolimus but later decreased to 2 mg twice daily due to worsening of kidney function.  As per [facility] nephrology's note recently,  tacrolimus level should be in the narrow range of 5-7.   Tacrolimus level sent on admission, resulted today showing level elevated to 27 (range 2-20).   Nephrology consultation called.  Continue prednisone.  Recent Labs (within last 365 days)        Recent Labs    01/19/22 1251 01/20/22 0511 01/21/22 0405 01/23/22 0327  BUN 82* 76* 76* 80*  CREATININE 6.79* 6.57* 6.74* 6.64*      History of vulvar cancer: Completed chemo and radiation therapy,  PET scan stable recently, outpatient followup with oncology.   History of chronic pelvic ulcer and osteomyelitis: Secondary to radiation therapy, she completed Zyvox treatment in September and last session of hyperbaric chamber treatment last week.  She reported much improved wound  locally, and she is scheduled to see wound care center next Tuesday.   Essential hypertension: Continue Coreg Add Hydralazine, aiming at BP goal of 130/80   Chronic DVT She had DVT in right thigh in 2021 during chemo and radiation.Patient was on Eliquis 2.5 mg twice  daily.   After few months, she had epistaxis while in the hospital and hence the Eliquis dose was reduced to 2.5 mg daily.   She has been on that dose for the last 2 years. On admission, Eliquis was held for surgery. Post operatively, Eliquis 2.5 mg daily was resumed.  Discussed with patient and pharmacy.  I do not think Eliquis 2.5 mg daily provides adequate protection against DVT.  At for the next few weeks, patient needs to be in a standard twice daily regimen.  Not actively bleeding currently.  I will increase Eliquis to 2.5 mg twice daily.  Continue to monitor.   Goals of care Code Status: Full Code    DVT prophylaxis: Eliquis Code Status:Full code Family Communication: No family at bed side. Disposition Plan:  Status is: Inpatient Remains inpatient appropriate because: PT and OT and Ortho clearance.  Pending arrangement of wound VAC.   Anticipated discharge home with home services 01/25/2022.  Consultants:  Orthopedics  Procedures:  ORIF Antimicrobials:  Anti-infectives (From admission, onward)    Start     Dose/Rate Route Frequency Ordered Stop   01/22/22 1600  ceFAZolin (ANCEF) IVPB 2g/100 mL premix        2 g 200 mL/hr over 30 Minutes Intravenous Every 6 hours 01/22/22 1316 01/24/22 0804   01/22/22 0924  vancomycin (VANCOCIN) powder  Status:  Discontinued          As needed 01/22/22 0924 01/22/22 1207   01/22/22 0745  ceFAZolin (ANCEF) IVPB 2g/100 mL premix        2 g 200 mL/hr over 30 Minutes Intravenous On call to O.R. 01/22/22 0740 01/22/22 0800   01/22/22 0600  ceFAZolin (ANCEF) IVPB 2g/100 mL premix  Status:  Discontinued        2 g 200 mL/hr over 30 Minutes Intravenous On call to O.R. 01/21/22 1940 01/22/22 1209       Subjective: Patient was seen and examined at bedside.  Overnight events noted. Right leg is still appears swollen.  She reports doing much better ,  She has 2 wound vacs connected , one in the thigh area the other one is in the  leg.  Objective: Vitals:   01/23/22 1753 01/23/22 2108 01/24/22 0510 01/24/22 0818  BP: (!) 143/89 (!) 160/98 (!) 172/97 (!) 150/95  Pulse: 83 78 87 89  Resp: '14 18 20 18  '$ Temp: 98.2 F (36.8 C) 98.6 F (37 C) 98.2 F (36.8 C) 98.4 F (36.9 C)  TempSrc: Oral Oral Oral Oral  SpO2: 98% 99% 100% 99%  Weight:      Height:        Intake/Output Summary (Last 24 hours) at 01/24/2022 1355 Last data filed at 01/24/2022 1251 Gross per 24 hour  Intake 760 ml  Output 200 ml  Net 560 ml   Filed Weights   01/19/22 1146 01/22/22 0717  Weight: 69 kg 69 kg    Examination:  General exam: Appears comfortable, not in any acute distress, seems present. Respiratory system: CTA bilaterally, respiratory effort normal, RR 15 Cardiovascular system: S1 & S2 heard, regular rate and rhythm, no murmur. Gastrointestinal system: Abdomen is soft, non tender, non distended, BS+ Central nervous system:  Alert and oriented x 3. No focal neurological deficits. Extremities: Right leg, right thigh with compression bandage, wound VAC noted Skin: No rashes, lesions or ulcers Psychiatry: Judgement and insight appear normal. Mood & affect appropriate.     Data Reviewed: I have personally reviewed following labs and imaging studies  CBC: Recent Labs  Lab 01/19/22 1251 01/19/22 2332 01/20/22 0511 01/21/22 0405 01/23/22 0327 01/23/22 1558 01/24/22 0438  WBC 2.9*  --  3.9* 3.9* 3.4*  --  4.2  NEUTROABS 0.9*  --   --  1.4* 2.6  --   --   HGB 6.4*   < > 7.0* 9.1* 6.6* 7.5* 7.6*  HCT 19.8*   < > 21.4* 27.5* 19.5* 21.9* 23.7*  MCV 96.6  --  93.9 91.1 91.5  --  91.2  PLT 111*  --  103* 113* 111*  --  133*   < > = values in this interval not displayed.   Basic Metabolic Panel: Recent Labs  Lab 01/19/22 1251 01/20/22 0511 01/21/22 0405 01/23/22 0327 01/24/22 0438  NA 142 140 137 137 139  K 4.0 4.2 4.3 4.9 4.4  CL 112* 109 105 104 103  CO2 21* 20* 17* 18* 19*  GLUCOSE 137* 126* 111* 159* 111*   BUN 82* 76* 76* 80* 79*  CREATININE 6.79* 6.57* 6.74* 6.64* 6.60*  CALCIUM 9.0 8.9 8.9 8.2* 9.0   GFR: Estimated Creatinine Clearance: 9.5 mL/min (A) (by C-G formula based on SCr of 6.6 mg/dL (H)). Liver Function Tests: No results for input(s): "AST", "ALT", "ALKPHOS", "BILITOT", "PROT", "ALBUMIN" in the last 168 hours. No results for input(s): "LIPASE", "AMYLASE" in the last 168 hours. No results for input(s): "AMMONIA" in the last 168 hours. Coagulation Profile: Recent Labs  Lab 01/19/22 1251  INR 1.1   Cardiac Enzymes: No results for input(s): "CKTOTAL", "CKMB", "CKMBINDEX", "TROPONINI" in the last 168 hours. BNP (last 3 results) No results for input(s): "PROBNP" in the last 8760 hours. HbA1C: No results for input(s): "HGBA1C" in the last 72 hours. CBG: No results for input(s): "GLUCAP" in the last 168 hours. Lipid Profile: No results for input(s): "CHOL", "HDL", "LDLCALC", "TRIG", "CHOLHDL", "LDLDIRECT" in the last 72 hours. Thyroid Function Tests: No results for input(s): "TSH", "T4TOTAL", "FREET4", "T3FREE", "THYROIDAB" in the last 72 hours. Anemia Panel: No results for input(s): "VITAMINB12", "FOLATE", "FERRITIN", "TIBC", "IRON", "RETICCTPCT" in the last 72 hours. Sepsis Labs: No results for input(s): "PROCALCITON", "LATICACIDVEN" in the last 168 hours.  Recent Results (from the past 240 hour(s))  Surgical pcr screen     Status: None   Collection Time: 01/21/22  9:37 PM   Specimen: Nasal Mucosa; Nasal Swab  Result Value Ref Range Status   MRSA, PCR NEGATIVE NEGATIVE Final   Staphylococcus aureus NEGATIVE NEGATIVE Final    Comment: (NOTE) The Xpert SA Assay (FDA approved for NASAL specimens in patients 42 years of age and older), is one component of a comprehensive surveillance program. It is not intended to diagnose infection nor to guide or monitor treatment. Performed at [facility], [place] [address], [place], [place] [address]     Radiology  Studies: DG Knee 1-2 Views Right  Result Date: 01/23/2022 CLINICAL DATA:  Postoperative status. EXAM: RIGHT KNEE - 1-2 VIEW COMPARISON:  January 22, 2022. FINDINGS: Status post surgical internal fixation of proximal tibial fracture. Moderate suprapatellar joint effusion is noted. No dislocation is noted. IMPRESSION: Status post surgical internal fixation of proximal tibial fracture. Moderate suprapatellar joint effusion. Electronically  Signed   By: Marijo Conception M.D.   On: 01/23/2022 14:56    Scheduled Meds:  sodium chloride   Intravenous Once   apixaban  2.5 mg Oral BID   carvedilol  25 mg Oral BID WC   docusate sodium  100 mg Oral BID   pantoprazole  40 mg Oral BID   predniSONE  5 mg Oral Q breakfast   sodium bicarbonate  1,300 mg Oral TID   tacrolimus  2 mg Oral BID   Continuous Infusions:  sodium chloride Stopped (01/22/22 0933)   methocarbamol (ROBAXIN) IV       LOS: 5 days    Time spent: 50 mins    Mickayla Trouten, MD [facility]   If 7PM-7AM, please contact night-coverage

## 2022-01-24 NOTE — Progress Notes (Signed)
[facility] Progress Note   Subjective:   walking in halls with PT - no new issues  Objective Vitals:   01/23/22 1753 01/23/22 2108 01/24/22 0510 01/24/22 0818  BP: (!) 143/89 (!) 160/98 (!) 172/97 (!) 150/95  Pulse: 83 78 87 89  Resp: '14 18 20 18  '$ Temp: 98.2 F (36.8 C) 98.6 F (37 C) 98.2 F (36.8 C) 98.4 F (36.9 C)  TempSrc: Oral Oral Oral Oral  SpO2: 98% 99% 100% 99%  Weight:      Height:       Physical Exam General: walking with RW in hall, looks comfortable Lungs: on RA Extremities: no edema Neuro: nonfocal, ambulating, conversant  Additional Objective Labs: Basic Metabolic Panel: Recent Labs  Lab 01/21/22 0405 01/23/22 0327 01/24/22 0438  NA 137 137 139  K 4.3 4.9 4.4  CL 105 104 103  CO2 17* 18* 19*  GLUCOSE 111* 159* 111*  BUN 76* 80* 79*  CREATININE 6.74* 6.64* 6.60*  CALCIUM 8.9 8.2* 9.0   Liver Function Tests: No results for input(s): "AST", "ALT", "ALKPHOS", "BILITOT", "PROT", "ALBUMIN" in the last 168 hours. No results for input(s): "LIPASE", "AMYLASE" in the last 168 hours. CBC: Recent Labs  Lab 01/19/22 1251 01/19/22 2332 01/20/22 0511 01/21/22 0405 01/23/22 0327 01/23/22 1558 01/24/22 0438  WBC 2.9*  --  3.9* 3.9* 3.4*  --  4.2  NEUTROABS 0.9*  --   --  1.4* 2.6  --   --   HGB 6.4*   < > 7.0* 9.1* 6.6* 7.5* 7.6*  HCT 19.8*   < > 21.4* 27.5* 19.5* 21.9* 23.7*  MCV 96.6  --  93.9 91.1 91.5  --  91.2  PLT 111*  --  103* 113* 111*  --  133*   < > = values in this interval not displayed.   Blood Culture    Component Value Date/Time   SDES ABDOMEN 04/30/2020 2130   SPECREQUEST NONE 04/30/2020 2130   CULT  04/30/2020 2130    RARE ENTEROCOCCUS FAECIUM VANCOMYCIN RESISTANT ENTEROCOCCUS ISOLATED NO ANAEROBES ISOLATED SEE SEPARATE REPORT FOR DAPTOMYCIN RESULT Performed at [employer] Performed at [facility], [address], [place], [place] [address]    REPTSTATUS 05/10/2020 FINAL 04/30/2020 2130     Cardiac Enzymes: No results for input(s): "CKTOTAL", "CKMB", "CKMBINDEX", "TROPONINI" in the last 168 hours. CBG: No results for input(s): "GLUCAP" in the last 168 hours. Iron Studies: No results for input(s): "IRON", "TIBC", "TRANSFERRIN", "FERRITIN" in the last 72 hours. '@lablastinr3'$ @ Studies/Results: DG Knee 1-2 Views Right  Result Date: 01/23/2022 CLINICAL DATA:  Postoperative status. EXAM: RIGHT KNEE - 1-2 VIEW COMPARISON:  January 22, 2022. FINDINGS: Status post surgical internal fixation of proximal tibial fracture. Moderate suprapatellar joint effusion is noted. No dislocation is noted. IMPRESSION: Status post surgical internal fixation of proximal tibial fracture. Moderate suprapatellar joint effusion. Electronically Signed   By: Marijo Conception M.D.   On: 01/23/2022 14:56   Medications:  sodium chloride Stopped (01/22/22 0933)   methocarbamol (ROBAXIN) IV      sodium chloride   Intravenous Once   apixaban  2.5 mg Oral BID   carvedilol  25 mg Oral BID WC   docusate sodium  100 mg Oral BID   pantoprazole  40 mg Oral BID   predniSONE  5 mg Oral Q breakfast   sodium bicarbonate  1,300 mg Oral TID   tacrolimus  2 mg Oral BID    Assessment/Plan **s/p R  hip and tibial fractures s/p operative repair 10/22: plans for home later this week, WBAT. Avoid morphine, baclofen.    **ESRD s/p 2nd renal transplant 2014:  recent Cr 6+, follows with Evangeline Dakin and [facility].  Recent discussion centered on RRT upcoming - no indication for dialysis currently, did not extensively discuss today.  On tac 2 BID and pred 5 daily - tac trough here not a trough - no dose change at this time; check trough tomorrow AM given dose recently decreased from 3 BID to 2 BID (*today's trough was ordered for 10am and pulled at 4:30am).   UOP adequate; po intake ok - hold MIVF.  Avoid nephrotoxins.    **HTN: BPs acceptable.  D/Cd fluids, manage pain. On coreg and hydralazine.    **Anemia:  multifactorial - being transfused today again, serial  H/H.  Investigate ESA.   **h/o vulvar ca: not an active issues except discomfort on purewick.     **h/o chronic pelvic ulcer/osteo: recently completed antibiotic and hyperbaric therapies with improvement noted.    **h/o DVT: on low dose eliquis, defer dosing to pharmacy.     I'll follow but not much to add from my perspective at this time.  Call if I can assist.   Jannifer Hick MD 01/24/2022, 1:39 PM  [facility] Pager: [phone]

## 2022-01-24 NOTE — TOC CAGE-AID Note (Signed)
Transition of Care [facility]) - CAGE-AID Screening   Patient Details  Name: Christina Mack MRN: [id number] Date of Birth: 05/06/1965  Transition of Care [facility]) CM/SW Contact:    Army Melia, RN Phone Number: 01/24/2022, 9:27 PM   CAGE-AID Screening:    Have You Ever Felt You Ought to Cut Down on Your Drinking or Drug Use?: No Have People Annoyed You By [employer] Your Drinking Or Drug Use?: No Have You Felt Bad Or Guilty About Your Drinking Or Drug Use?: No Have You Ever Had a Drink or Used Drugs First Thing In The Morning to Steady Your Nerves or to Get Rid of a Hangover?: No CAGE-AID Score: 0  Substance Abuse Education Offered: No

## 2022-01-24 NOTE — Progress Notes (Signed)
Subjective: 2 Days Post-Op Procedure(s) (LRB): TOTAL HIP ARTHROPLASTY ANTERIOR APPROACH (Right) OPEN REDUCTION INTERNAL FIXATION (ORIF) TIBIAL PLATEAU (Right) APPLICATION OF WOUND VAC (Right) Patient reports pain as mild.  Having some discomfort primarily to the right knee.  Feeling much better after blood transfusion yesterday  Objective: Vital signs in last 24 hours: Temp:  [98.2 F (36.8 C)-98.6 F (37 C)] 98.2 F (36.8 C) (10/24 0510) Pulse Rate:  [77-87] 87 (10/24 0510) Resp:  [14-20] 20 (10/24 0510) BP: (137-172)/(86-98) 172/97 (10/24 0510) SpO2:  [98 %-100 %] 100 % (10/24 0510)  Intake/Output from previous day: 10/23 0701 - 10/24 0700 In: 1222 [P.O.:900; Blood:322] Out: 200 [Urine:200] Intake/Output this shift: No intake/output data recorded.  Recent Labs    01/23/22 0327 01/23/22 1558 01/24/22 0438  HGB 6.6* 7.5* 7.6*   Recent Labs    01/23/22 0327 01/23/22 1558 01/24/22 0438  WBC 3.4*  --  4.2  RBC 2.13*  --  2.60*  HCT 19.5* 21.9* 23.7*  PLT 111*  --  133*   Recent Labs    01/23/22 0327 01/24/22 0438  NA 137 139  K 4.9 4.4  CL 104 103  CO2 18* 19*  BUN 80* 79*  CREATININE 6.64* 6.60*  GLUCOSE 159* 111*  CALCIUM 8.2* 9.0   No results for input(s): "LABPT", "INR" in the last 72 hours.  Neurologically intact Neurovascular intact Sensation intact distally Intact pulses distally Dorsiflexion/Plantar flexion intact Incision: dressing C/D/I No cellulitis present Compartment soft   Assessment/Plan: 2 Days Post-Op Procedure(s) (LRB): TOTAL HIP ARTHROPLASTY ANTERIOR APPROACH (Right) OPEN REDUCTION INTERNAL FIXATION (ORIF) TIBIAL PLATEAU (Right) APPLICATION OF WOUND VAC (Right) Up with therapy WBAT RLE Still awaiting cpm ordered by Dr. Marlou Sa yesterday Continue wound vac- Please swap out hospital unit for prevana day of d/c] F/u with Dr. Erlinda Hong two weeks post-op     Aundra Dubin 01/24/2022, 8:11 AM

## 2022-01-24 NOTE — Progress Notes (Signed)
Physical Therapy Treatment Patient Details Name: Christina Mack MRN: [id number] DOB: [birth year] Today's Date: 01/24/2022   History of Present Illness 56 y.o. female presents to [facility] hospital on 01/19/2022 after sustaining a fall. Pt found to have acute subcapital R femur fx, acute nondisplaced fx of the lateral tibial plateau, and R superior pubic ramus fx. Pt underwent R THA and ORIF of tibial plateau fx on 10/22. PMH includes renal transplant, CKD IV, DVT, chronic osteomyelitis, vulvar cancer, chronic anemia.    PT Comments    Making progress towards functional goals. Requires supervision ambulating with RW into hallway up to 70 feet. Good UE strength without evidence of buckling or LOB while using this AD. Pt requests transport w/c rental. She is looking into wheels for her standard walker, as she reports insurance will not cover new equipment since she purchased a rollator last year. Encourage Rt knee and hip AROM. Declines stair training today, feels she will have enough support at home once d/c. Patient will continue to benefit from skilled physical therapy services to further improve independence with functional mobility.   Recommendations for follow up therapy are one component of a multi-disciplinary discharge planning process, led by the attending physician.  Recommendations may be updated based on patient status, additional functional criteria and insurance authorization.  Follow Up Recommendations  Home health PT     Assistance Recommended at Discharge Intermittent Supervision/Assistance  Patient can return home with the following A little help with walking and/or transfers;A little help with bathing/dressing/bathroom;Help with stairs or ramp for entrance;Assist for transportation;Assistance with cooking/housework   Equipment Recommendations  Rolling walker (2 wheels)    Recommendations for Other Services       Precautions / Restrictions Precautions Precautions: Fall;Other  (comment) Precaution Comments: RLE wound vac Restrictions Weight Bearing Restrictions: Yes RLE Weight Bearing: Weight bearing as tolerated Other Position/Activity Restrictions: no ROM restrictions to R knee     Mobility  Bed Mobility Overal bed mobility: Needs Assistance Bed Mobility: Supine to Sit     Supine to sit: Min assist     General bed mobility comments: Min assist for RLE to bring OOB and lower to floor.    Transfers Overall transfer level: Needs assistance Equipment used: Rolling walker (2 wheels) Transfers: Sit to/from Stand Sit to Stand: Min guard           General transfer comment: Min guard for safety, effortful but stable once upright on RW for support. Fair control with descent into recliner.    Ambulation/Gait Ambulation/Gait assistance: Supervision Gait Distance (Feet): 70 Feet Assistive device: Rolling walker (2 wheels) Gait Pattern/deviations: Step-to pattern, Decreased stance time - right, Decreased stride length, Antalgic, Step-through pattern Gait velocity: decreased Gait velocity interpretation: <1.31 ft/sec, indicative of household ambulator   General Gait Details: Gait training with RW, managed lines for wound vac. No evidence of LOB or buckling. Pt estimates up to 40% WB through RLE at this time. Increased distance tolerated today.   Stairs Stairs:  (declines)           Wheelchair Mobility    Modified Rankin (Stroke Patients Only)       Balance Overall balance assessment: Needs assistance Sitting-balance support: Feet supported, No upper extremity supported Sitting balance-Leahy Scale: Good     Standing balance support: Single extremity supported Standing balance-Leahy Scale: Poor  Cognition Arousal/Alertness: Awake/alert Behavior During Therapy: WFL for tasks assessed/performed, Flat affect Overall Cognitive Status: Within Functional Limits for tasks assessed                                           Exercises      General Comments        Pertinent Vitals/Pain Pain Assessment Faces Pain Scale: Hurts even more Pain Location: RLE Pain Descriptors / Indicators: Grimacing (with mobility) Pain Intervention(s): Monitored during session, Repositioned    Home Living                          Prior Function            PT Goals (current goals can now be found in the care plan section) Acute Rehab PT Goals Patient Stated Goal: Get a little HHPT PT Goal Formulation: With patient Time For Goal Achievement: 01/30/22 Potential to Achieve Goals: Good Progress towards PT goals: Progressing toward goals    Frequency    Min 5X/week      PT Plan Current plan remains appropriate    Co-evaluation              AM-PAC PT "6 Clicks" Mobility   Outcome Measure  Help needed turning from your back to your side while in a flat bed without using bedrails?: A Little Help needed moving from lying on your back to sitting on the side of a flat bed without using bedrails?: A Little Help needed moving to and from a bed to a chair (including a wheelchair)?: A Little Help needed standing up from a chair using your arms (e.g., wheelchair or bedside chair)?: A Little Help needed to walk in hospital room?: A Little Help needed climbing 3-5 steps with a railing? : A Lot 6 Click Score: 17    End of Session   Activity Tolerance: Patient tolerated treatment well Patient left: in chair;with call bell/phone within reach   PT Visit Diagnosis: Unsteadiness on feet (R26.81);Other abnormalities of gait and mobility (R26.89);Difficulty in walking, not elsewhere classified (R26.2);Pain Pain - Right/Left: Right Pain - part of body: Knee     Time: 2297-9892 PT Time Calculation (min) (ACUTE ONLY): 19 min  Charges:  $Therapeutic Activity: 8-22 mins                     Candie Mile, PT, DPT Physical Therapist Acute Rehabilitation  Services [place] 01/24/2022, 11:16 AM

## 2022-01-25 ENCOUNTER — Other Ambulatory Visit (HOSPITAL_COMMUNITY): Payer: Self-pay

## 2022-01-25 DIAGNOSIS — D649 Anemia, unspecified: Secondary | ICD-10-CM | POA: Diagnosis not present

## 2022-01-25 LAB — RENAL FUNCTION PANEL
Albumin: 2.5 g/dL — ABNORMAL LOW (ref 3.5–5.0)
Anion gap: 17 — ABNORMAL HIGH (ref 5–15)
BUN: 77 mg/dL — ABNORMAL HIGH (ref 6–20)
CO2: 17 mmol/L — ABNORMAL LOW (ref 22–32)
Calcium: 9 mg/dL (ref 8.9–10.3)
Chloride: 106 mmol/L (ref 98–111)
Creatinine, Ser: 6.29 mg/dL — ABNORMAL HIGH (ref 0.44–1.00)
GFR, Estimated: 7 mL/min — ABNORMAL LOW (ref >60)
Glucose, Bld: 172 mg/dL — ABNORMAL HIGH (ref 70–99)
Phosphorus: 5.5 mg/dL — ABNORMAL HIGH (ref 2.5–4.6)
Potassium: 4.2 mmol/L (ref 3.5–5.1)
Sodium: 140 mmol/L (ref 135–145)

## 2022-01-25 LAB — CBC
HCT: 21 % — ABNORMAL LOW (ref 36.0–46.0)
Hemoglobin: 7 g/dL — ABNORMAL LOW (ref 12.0–15.0)
MCH: 29.8 pg (ref 26.0–34.0)
MCHC: 33.3 g/dL (ref 30.0–36.0)
MCV: 89.4 fL (ref 80.0–100.0)
Platelets: 114 10*3/uL — ABNORMAL LOW (ref 150–400)
RBC: 2.35 MIL/uL — ABNORMAL LOW (ref 3.87–5.11)
RDW: 17.9 % — ABNORMAL HIGH (ref 11.5–15.5)
WBC: 4.1 10*3/uL (ref 4.0–10.5)
nRBC: 0 % (ref 0.0–0.2)

## 2022-01-25 MED ORDER — TRAZODONE HCL 50 MG PO TABS: 50.0000 mg | ORAL_TABLET | Freq: Every evening | ORAL | Status: DC | PRN

## 2022-01-25 MED ORDER — APIXABAN 2.5 MG PO TABS
2.5000 mg | ORAL_TABLET | Freq: Two times a day (BID) | ORAL | 0 refills | Status: DC
  Filled 2022-01-25: qty 60, 30d supply, fill #0

## 2022-01-25 MED ORDER — GUAIFENESIN 100 MG/5ML PO LIQD: 5.0000 mL | ORAL | Status: DC | PRN

## 2022-01-25 MED ORDER — IPRATROPIUM-ALBUTEROL 0.5-2.5 (3) MG/3ML IN SOLN: 3.0000 mL | RESPIRATORY_TRACT | Status: DC | PRN

## 2022-01-25 MED ORDER — METOPROLOL TARTRATE 5 MG/5ML IV SOLN: 5.0000 mg | INTRAVENOUS | Status: DC | PRN

## 2022-01-25 MED ORDER — HYDRALAZINE HCL 20 MG/ML IJ SOLN
10.0000 mg | INTRAMUSCULAR | Status: DC | PRN
  Administered 2022-01-25: 10 mg via INTRAVENOUS
  Filled 2022-01-25: qty 1

## 2022-01-25 MED ORDER — APIXABAN 2.5 MG PO TABS: 2.5000 mg | ORAL_TABLET | Freq: Two times a day (BID) | ORAL | 0 refills | Status: DC

## 2022-01-25 MED ORDER — DARBEPOETIN ALFA 60 MCG/0.3ML IJ SOSY
60.0000 ug | PREFILLED_SYRINGE | Freq: Once | INTRAMUSCULAR | Status: AC
  Administered 2022-01-25: 60 ug via SUBCUTANEOUS
  Filled 2022-01-25: qty 0.3

## 2022-01-25 NOTE — Progress Notes (Addendum)
[facility] Progress Note   Subjective:   seen in room this AM - anxious to go home, no new issues  Objective Vitals:   01/24/22 1717 01/24/22 2207 01/25/22 0557 01/25/22 0837  BP: (!) 169/98 (!) 156/93 (!) 164/97 (!) 182/97  Pulse: 82 90 79 83  Resp: '17 15 14 17  '$ Temp: 98.3 F (36.8 C) 99.1 F (37.3 C) 98.4 F (36.9 C) 99 F (37.2 C)  TempSrc: Oral Oral  Oral  SpO2: 100% 100% 99% 99%  Weight:      Height:       Physical Exam General: sitting in bed, looks comfortable Lungs: on RA Extremities: no edema Neuro: nonfocal, conversant  Additional Objective Labs: Basic Metabolic Panel: Recent Labs  Lab 01/23/22 0327 01/24/22 0438 01/25/22 0925  NA 137 139 140  K 4.9 4.4 4.2  CL 104 103 106  CO2 18* 19* 17*  GLUCOSE 159* 111* 172*  BUN 80* 79* 77*  CREATININE 6.64* 6.60* 6.29*  CALCIUM 8.2* 9.0 9.0  PHOS  --   --  5.5*    Liver Function Tests: Recent Labs  Lab 01/25/22 0925  ALBUMIN 2.5*   No results for input(s): "LIPASE", "AMYLASE" in the last 168 hours. CBC: Recent Labs  Lab 01/19/22 1251 01/19/22 2332 01/20/22 0511 01/21/22 0405 01/23/22 0327 01/23/22 1558 01/24/22 0438 01/25/22 0925  WBC 2.9*  --  3.9* 3.9* 3.4*  --  4.2 4.1  NEUTROABS 0.9*  --   --  1.4* 2.6  --   --   --   HGB 6.4*   < > 7.0* 9.1* 6.6* 7.5* 7.6* 7.0*  HCT 19.8*   < > 21.4* 27.5* 19.5* 21.9* 23.7* 21.0*  MCV 96.6  --  93.9 91.1 91.5  --  91.2 89.4  PLT 111*  --  103* 113* 111*  --  133* 114*   < > = values in this interval not displayed.    Blood Culture    Component Value Date/Time   SDES ABDOMEN 04/30/2020 2130   SPECREQUEST NONE 04/30/2020 2130   CULT  04/30/2020 2130    RARE ENTEROCOCCUS FAECIUM VANCOMYCIN RESISTANT ENTEROCOCCUS ISOLATED NO ANAEROBES ISOLATED SEE SEPARATE REPORT FOR DAPTOMYCIN RESULT Performed at [employer] Performed at [facility], [address] [place], [place] [address]    REPTSTATUS 05/10/2020 FINAL  04/30/2020 2130    Cardiac Enzymes: No results for input(s): "CKTOTAL", "CKMB", "CKMBINDEX", "TROPONINI" in the last 168 hours. CBG: No results for input(s): "GLUCAP" in the last 168 hours. Iron Studies: No results for input(s): "IRON", "TIBC", "TRANSFERRIN", "FERRITIN" in the last 72 hours. '@lablastinr3'$ @ Studies/Results: DG Knee 1-2 Views Right  Result Date: 01/23/2022 CLINICAL DATA:  Postoperative status. EXAM: RIGHT KNEE - 1-2 VIEW COMPARISON:  January 22, 2022. FINDINGS: Status post surgical internal fixation of proximal tibial fracture. Moderate suprapatellar joint effusion is noted. No dislocation is noted. IMPRESSION: Status post surgical internal fixation of proximal tibial fracture. Moderate suprapatellar joint effusion. Electronically Signed   By: Marijo Conception M.D.   On: 01/23/2022 14:56   Medications:  sodium chloride Stopped (01/22/22 0933)   methocarbamol (ROBAXIN) IV      sodium chloride   Intravenous Once   apixaban  2.5 mg Oral BID   carvedilol  25 mg Oral BID WC   darbepoetin (ARANESP) injection - NON-DIALYSIS  60 mcg Subcutaneous Once   docusate sodium  100 mg Oral BID   pantoprazole  40 mg Oral BID  predniSONE  5 mg Oral Q breakfast   sodium bicarbonate  1,300 mg Oral TID   tacrolimus  2 mg Oral BID    Assessment/Plan **s/p R hip and tibial fractures s/p operative repair 10/22: plans for home later this week, WBAT. Avoid morphine, baclofen.    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  Recent discussion centered on RRT upcoming - no indication for dialysis currently, did not extensively discuss today.  On tac 2 BID and pred 5 daily - tac trough here not a trough - no dose change at this time; check trough ths AM given dose recently decreased from 3 BID to 2 BID (**the only valid trough is 10/25**).   UOP adequate; po intake ok - hold MIVF.  Avoid nephrotoxins.    **HTN: BPs acceptable.  D/Cd  fluids, manage pain. On coreg and hydralazine.    **Anemia: multifactorial - s/p multiple transfusions recently.  Outpt transplant nephrology arranging ESA, will dose while here.    **h/o vulvar ca: not an active issues except discomfort on purewick.     **h/o chronic pelvic ulcer/osteo: recently completed antibiotic and hyperbaric therapies with improvement noted.    **h/o DVT: on low dose eliquis, defer dosing to pharmacy.     Kidney function is stable, nothing further to add and will sign off.  [place] for d/c from my perspective.  Her true tacrolimus trough was finally collected at the right time today - won't be back for a few days, will f/u with it.   Has f/u with Dr. Lissa Merlin transplant nephrologist 11/17 - I think that interval is fine.  Jannifer Hick MD 01/25/2022, 11:33 AM  [facility] Pager: [phone]

## 2022-01-25 NOTE — Progress Notes (Signed)
DISCHARGE NOTE HOME Christina Mack to be discharged Home per MD order. Discussed prescriptions and follow up appointments with the patient. Prescriptions given to patient; medication list explained in detail. Patient verbalized understanding.  Skin clean, dry and intact without evidence of skin break down, no evidence of skin tears noted. IV catheter discontinued intact. Site without signs and symptoms of complications. Dressing and pressure applied. Pt denies pain at the site currently. No complaints noted.  Patient free of lines, drains, and wounds.   An After Visit Summary (AVS) was printed and given to the patient. Patient escorted via wheelchair, and discharged home via private auto.  [name] S Lucero Ide, RN

## 2022-01-25 NOTE — Progress Notes (Signed)
Physical Therapy Treatment Patient Details Name: Christina Mack MRN: [id number] DOB: [birth year] Today's Date: 01/25/2022   History of Present Illness 56 y.o. female presents to [facility] hospital on 01/19/2022 after sustaining a fall. Pt found to have acute subcapital R femur fx, acute nondisplaced fx of the lateral tibial plateau, and R superior pubic ramus fx. Pt underwent R THA and ORIF of tibial plateau fx on 10/22. PMH includes renal transplant, CKD IV, DVT, chronic osteomyelitis, vulvar cancer, chronic anemia.    PT Comments    Patient progressing well towards physical therapy goals. Patient increased ambulation distance to 200' with supervision and use of RW. Able to negotiate 3 stairs with rail on L and HHAx1 along with min guard for safety. Encouraged therex while in chair to improve LE strengthening and ROM. D/c plan remains appropriate.     Recommendations for follow up therapy are one component of a multi-disciplinary discharge planning process, led by the attending physician.  Recommendations may be updated based on patient status, additional functional criteria and insurance authorization.  Follow Up Recommendations  Home health PT     Assistance Recommended at Discharge Intermittent Supervision/Assistance  Patient can return home with the following A little help with walking and/or transfers;A little help with bathing/dressing/bathroom;Help with stairs or ramp for entrance;Assist for transportation;Assistance with cooking/housework   Equipment Recommendations  Rolling Sherylann Vangorden (2 wheels)    Recommendations for Other Services       Precautions / Restrictions Precautions Precautions: Fall;Other (comment) Precaution Comments: RLE wound vac Restrictions Weight Bearing Restrictions: Yes RLE Weight Bearing: Weight bearing as tolerated Other Position/Activity Restrictions: no ROM restrictions to R knee     Mobility  Bed Mobility Overal bed mobility: Needs Assistance Bed  Mobility: Supine to Sit     Supine to sit: Min assist     General bed mobility comments: Min assist for RLE management    Transfers Overall transfer level: Needs assistance Equipment used: Rolling Tabor Denham (2 wheels) Transfers: Sit to/from Stand Sit to Stand: Supervision           General transfer comment: supervision for safety. No physical assist required. Good hand placement    Ambulation/Gait Ambulation/Gait assistance: Supervision Gait Distance (Feet): 200 Feet Assistive device: Rolling Dornell Grasmick (2 wheels) Gait Pattern/deviations: Step-to pattern, Decreased stance time - right, Decreased stride length, Antalgic Gait velocity: decreased     General Gait Details: Cues for heel strike but patient reporting difficulty due to wound vac placement . supervision for safety and line management   Stairs Stairs: Yes Stairs assistance: Min guard Stair Management: One rail Left, Step to pattern, Forwards (with HHA on R) Number of Stairs: 3 General stair comments: instructed on up with good and down with bad for safe stair negotiation. min guard for safety. heavy use of rail and HHA   Wheelchair Mobility    Modified Rankin (Stroke Patients Only)       Balance Overall balance assessment: Needs assistance Sitting-balance support: Feet supported, No upper extremity supported Sitting balance-Leahy Scale: Good     Standing balance support: Bilateral upper extremity supported Standing balance-Leahy Scale: Poor                              Cognition Arousal/Alertness: Awake/alert Behavior During Therapy: WFL for tasks assessed/performed, Flat affect Overall Cognitive Status: Within Functional Limits for tasks assessed  Exercises Other Exercises Other Exercises: encouraged quad sets, short arc quad, SLR, hip abduction    General Comments        Pertinent Vitals/Pain Pain Assessment Pain  Assessment: Faces Faces Pain Scale: Hurts little more Pain Location: RLE Pain Descriptors / Indicators: Grimacing Pain Intervention(s): Monitored during session    Home Living                          Prior Function            PT Goals (current goals can now be found in the care plan section) Acute Rehab PT Goals Patient Stated Goal: to go home today PT Goal Formulation: With patient Time For Goal Achievement: 01/30/22 Potential to Achieve Goals: Good Progress towards PT goals: Progressing toward goals    Frequency    Min 5X/week      PT Plan Current plan remains appropriate    Co-evaluation              AM-PAC PT "6 Clicks" Mobility   Outcome Measure  Help needed turning from your back to your side while in a flat bed without using bedrails?: A Little Help needed moving from lying on your back to sitting on the side of a flat bed without using bedrails?: A Little Help needed moving to and from a bed to a chair (including a wheelchair)?: A Little Help needed standing up from a chair using your arms (e.g., wheelchair or bedside chair)?: A Little Help needed to walk in hospital room?: A Little Help needed climbing 3-5 steps with a railing? : A Little 6 Click Score: 18    End of Session   Activity Tolerance: Patient tolerated treatment well Patient left: in chair;with call bell/phone within reach Nurse Communication: Mobility status PT Visit Diagnosis: Unsteadiness on feet (R26.81);Other abnormalities of gait and mobility (R26.89);Difficulty in walking, not elsewhere classified (R26.2);Pain Pain - Right/Left: Right Pain - part of body: Knee     Time: 7494-4967 PT Time Calculation (min) (ACUTE ONLY): 20 min  Charges:  $Gait Training: 8-22 mins                     Eyla Tallon A. Gilford Rile PT, DPT Acute Rehabilitation Services Office [phone]    Linna Hoff 01/25/2022, 11:37 AM

## 2022-01-25 NOTE — TOC Transition Note (Addendum)
Transition of Care [facility]) - CM/SW Discharge Note   Patient Details  Name: Christina Mack MRN: [id number] Date of Birth: 11-14-1965  Transition of [facility]) CM/SW Contact:  Tom-Johnson, Renea Ee, RN Phone Number: 01/25/2022, 2:05 PM   Clinical Narrative:     Patient is scheduled for discharge today. Home health info on AVS. Patient will be going home with Prevena wound vac. RW ordered from [employer] and Jodell Cipro to deliver at bedside. Family to transport at discharge. No further TOC needs noted.  c  14:25- CM notified by Jodell Cipro with [employer] that patient received a Rollator on 02/13/21 and not eligible for a RW at this time. Patient states she has a straight walker at home and has ordered wheels and declined paying out of pocket for RW.  No further TOC needs noted.  Final next level of care: [place] Barriers to Discharge: Continued Medical Work up   Patient Goals and CMS Choice Patient states their goals for this hospitalization and ongoing recovery are:: To return home CMS Medicare.gov Compare Post Acute Care list provided to:: Patient Choice offered to / list presented to : Patient  Discharge Placement                       Discharge Plan and Services   Discharge Planning Services: CM Consult Post Acute Care Choice: Home Health          DME Arranged: Walker rolling DME Agency: [employer] Date DME Agency Contacted: 01/23/22 Time DME Agency Contacted: 1157 Representative spoke with at DME Agency: [place]: PT [place]: [place] Date [place]: 01/23/22 Time [place]: [place] Representative spoke with at [place]: Tommi Rumps  Social Determinants of Health ([place]) Interventions     Readmission Risk Interventions     No data to display

## 2022-01-25 NOTE — Discharge Summary (Addendum)
Physician Discharge Summary  Christina Mack OJJ:[id number] DOB: 1965/09/21 DOA: 01/19/2022  PCP: System, Provider Not In  Admit date: 01/19/2022 Discharge date: 01/25/2022  Admitted From: Home Disposition:  Home  Recommendations for Outpatient Follow-up:  Follow up with PCP in 1-2 weeks Please obtain BMP/CBC in one week your next doctors visit.  Follow-up outpatient orthopedic in next 1 week Eliquis increased to 2.'5mg'$  po bid (was on daily prior to admission, but increased due to elevated DVT risk post op, can be readdressed outpatient with PCP if needed) Right lower extremity weightbearing as tolerated PT recommended home health Follow-up outpatient transplant nephrology.  Has an appointment on 11/17 Pain medication by orthopedic.  Patient tells me she has enough stool softeners at home  Home Health: PT/OT Equipment/Devices: Prevena wound VAC Discharge Condition: Stable CODE STATUS: Full code Diet recommendation: Renal  Brief/Interim Summary:  56 y.o. 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 She presented with mechanical fall leading to right knee swelling and hip pain.  CT showed right hip fracture, superior pubic ramus fracture.  Orthopedic was consulted and underwent ORIF on 10/22.  Also developed blood loss anemia requiring 3 units of PRBC transfusion in total.  Hemoglobin eventually remained stable.  PT/OT recommended home health therefore arrangements were made.  Today she is medically stable for discharge with recommendations as stated above.     Assessment & Plan:  Principal Problem:   Symptomatic anemia Active Problems:   Anemia associated with acute blood loss   Closed fracture of neck of right femur (HCC)    Acute subcapital right femur fracture: Acute nondisplaced fracture of the lateral tibial plateau: Secondary to mechanical fall, underwent ORIF by orthopedic on 10/22.  Supportive care, pain control, bowel regimen.  Has wound VAC in place with plans to swap to [place] on the day of discharge.  For now weightbearing as tolerated on right lower extremity.  PT/OT recommended home health.  Plans to arrange this at home     Right pubic fracture: There is some concerns of lytic lesion.  Currently supportive care and pain control   Acute blood loss anemia: secondary to large hemarthrosis of the right knee after the fall.  Hemoglobin stable at 7.6.  Recommend repeating CBC outpatient in next 3-5 days Hemoglobin is low at 6.4.  Required transfusion of 1 unit on 10/19, 10/20 and 10/23.  Hemoglobin now remained stable   s/p renal transplant with chronic rejection: CKD stage IV  Baseline creatinine over 6.  Has seen outpatient [place] nephrology and was told she might be undergoing rejection.  Nephrology team is following here.  Plan to continue tacrolimus 2 mg twice daily and prednisone 5 mg daily.   History of vulvar cancer: Completed chemo and radiation therapy,  PET scan stable recently, outpatient followup with oncology.   History of chronic pelvic ulcer and osteomyelitis: Secondary to radiation therapy, she completed Zyvox treatment in September and last session of hyperbaric chamber treatment last week.  She reported much improved wound locally, and she is scheduled to see wound care center next Tuesday.   Essential hypertension: Currently on Coreg.    Chronic DVT Diagnosed back in 2021 during chemoradiation.  Currently on 2.5 mg Eliquis twice daily.        Discharge Diagnoses:  Principal Problem:   Symptomatic anemia Active Problems:   Anemia associated with acute blood loss   Closed fracture of neck of right femur [facility])      Consultations: Orthopedic  Subjective: Feels well  no complaints.  Wishes to go home  Discharge Exam: Vitals:   01/25/22 0557 01/25/22 0837  BP: (!) 164/97 (!) 182/97  Pulse: 79 83  Resp: 14 17  Temp: 98.4 F (36.9 C) 99 F (37.2 C)  SpO2: 99% 99%   Vitals:   01/24/22 1717 01/24/22 2207 01/25/22 0557 01/25/22 0837  BP: (!) 169/98 (!) 156/93 (!) 164/97 (!) 182/97  Pulse: 82 90 79 83  Resp: '17 15 14 17  '$ Temp: 98.3 F (36.8 C) 99.1 F (37.3 C) 98.4 F (36.9 C) 99 F (37.2 C)  TempSrc: Oral Oral  Oral  SpO2: 100% 100% 99% 99%  Weight:      Height:        General: Pt is alert, awake, not in acute distress Cardiovascular: RRR, S1/S2 +, no rubs, no gallops Respiratory: CTA bilaterally, no wheezing, no rhonchi Abdominal: Soft, NT, ND, bowel sounds + Extremities: no edema, no cyanosis   Discharge Instructions  Discharge Instructions     Weight bearing as tolerated   Complete by: As directed       Allergies as of 01/25/2022       Reactions   Oxycodone-acetaminophen Itching, Nausea Only, Other (See Comments), Nausea And Vomiting, Rash   Other Reaction: HA Other Reaction: HA   Zosyn [piperacillin Sod-tazobactam So] Other (See Comments)   Thrombocytopenia   Hydromorphone Nausea And Vomiting, Rash, Other (See Comments)   Amlodipine Other (See Comments)   Ankle swelling   Codeine    vomiting        Medication List     STOP taking these medications    triamcinolone ointment 0.5 % Commonly known as: KENALOG       TAKE these medications    acetaminophen 500 MG tablet Commonly known as: TYLENOL Take 2 tablets (1,000 mg total) by mouth 3 (three) times daily. What changed:  when to take this reasons to take this   apixaban 2.5 MG Tabs tablet Commonly known as: ELIQUIS Take 1 tablet (2.5 mg total) by mouth 2 (two) times daily. What changed:  how much to take when to take this   carvedilol 25 MG tablet Commonly known as: COREG Take 25 mg by mouth in the morning and at bedtime.    HYDROcodone-acetaminophen 7.5-325 MG tablet Commonly known as: Norco Take 1-2 tablets by mouth 3 (three) times daily as needed for moderate pain.   pantoprazole 40 MG tablet Commonly known as: PROTONIX TAKE 1 TABLET (40 MG TOTAL) BY MOUTH 2 (TWO) TIMES DAILY BEFORE A MEAL. What changed:  how much to take when to take this   predniSONE 5 MG tablet Commonly known as: DELTASONE Take 5 mg by mouth daily with breakfast.   sodium bicarbonate 650 MG tablet Take 1,300 mg by mouth 3 (three) times daily.   tacrolimus 1 MG capsule Commonly known as: PROGRAF Take 2 mg by mouth in the morning and at bedtime.               Durable Medical Equipment  (From admission, onward)           Start     Ordered   01/23/22 1622  For home use only DME Walker rolling  Once       Question Answer Comment  Walker: With [facility]   Patient needs a walker to treat with the following condition Gait difficulty      01/23/22 1621   01/23/22 1458  For home use only DME Walker  Once       Question:  Patient needs a walker to treat with the following condition  Answer:  Difficulty walking   01/23/22 1458              Discharge Care Instructions  (From admission, onward)           Start     Ordered   01/22/22 0000  Weight bearing as tolerated        01/22/22 1012            Follow-up Information     Leandrew Koyanagi, MD Follow up in 2 week(s).   Specialty: Orthopedic Surgery Why: For suture removal, For wound re-check Contact information: [place] [address] [phone]         Care, [facility] Follow up.   Specialty: Home Health Services Why: Someone will call to schedule first home visit. Contact information: [address] [place] [address] [phone]                Allergies  Allergen Reactions   Oxycodone-Acetaminophen Itching, Nausea Only, Other (See Comments), Nausea And Vomiting and Rash     Other Reaction: HA Other Reaction: HA    Zosyn [Piperacillin Sod-Tazobactam So] Other (See Comments)    Thrombocytopenia   Hydromorphone Nausea And Vomiting, Rash and Other (See Comments)   Amlodipine Other (See Comments)    Ankle swelling   Codeine     vomiting    You were cared for by a hospitalist during your hospital stay. If you have any questions about your discharge medications or the care you received while you were in the hospital after you are discharged, you can call the unit and asked to speak with the hospitalist on call if the hospitalist that took care of you is not available. Once you are discharged, your primary care physician will handle any further medical issues. Please note that no refills for any discharge medications will be authorized once you are discharged, as it is imperative that you return to your primary care physician (or establish a relationship with a primary care physician if you do not have one) for your aftercare needs so that they can reassess your need for medications and monitor your lab values.   Procedures/Studies: DG Knee 1-2 Views Right  Result Date: 01/23/2022 CLINICAL DATA:  Postoperative status. EXAM: RIGHT KNEE - 1-2 VIEW COMPARISON:  January 22, 2022. FINDINGS: Status post surgical internal fixation of proximal tibial fracture. Moderate suprapatellar joint effusion is noted. No dislocation is noted. IMPRESSION: Status post surgical internal fixation of proximal tibial fracture. Moderate suprapatellar joint effusion. Electronically Signed   By: Marijo Conception M.D.   On: 01/23/2022 14:56   Pelvis Portable  Result Date: 01/22/2022 CLINICAL DATA:  Hip replacement. EXAM: PORTABLE PELVIS 1-2 VIEWS COMPARISON:  Fluoroscopic images of same day. FINDINGS: Right femoral and acetabular components are well situated. Expected postoperative changes are seen in the surrounding soft tissues. IMPRESSION: Status post right total hip arthroplasty. Electronically  Signed   By: Marijo Conception M.D.   On: 01/22/2022 12:43   DG Knee 1-2 Views Right  Result Date: 01/22/2022 CLINICAL DATA:  Tibial plateau fracture. EXAM: OPERATIVE right knee  2 VIEWS  TECHNIQUE: Fluoroscopic spot image(s) were submitted for interpretation post-operatively. Radiation Exposure Index (as provided by the fluoroscopic device): 1.30 mGy air kerma COMPARISON:  MRI of the right knee 01/20/2022 FINDINGS: Lateral plate and screw fixation noted. No new fractures are present. Joint is located. IMPRESSION: Lateral plate and screw fixation without radiographic evidence for complication. Electronically Signed   By: San Morelle M.D.   On: 01/22/2022 12:01   DG HIP UNILAT WITH PELVIS 2-3 VIEWS RIGHT  Result Date: 01/22/2022 CLINICAL DATA:  Surgery, elective.  Right total hip arthroplasty. EXAM: OPERATIVE right HIP (WITH PELVIS IF PERFORMED) 6 VIEWS TECHNIQUE: Fluoroscopic spot image(s) were submitted for interpretation post-operatively. Radiation Exposure Index (as provided by the fluoroscopic device): 2.19 mGy air kerma COMPARISON:  CT of the right hip 01/19/2022 FINDINGS: Intraoperative images demonstrate right total hip arthroplasty. No acute fractures are present. Femoral and acetabular components appear to be well seated. Hip appears located on this single view. IMPRESSION: Right total hip arthroplasty without radiographic evidence for complication. Electronically Signed   By: San Morelle M.D.   On: 01/22/2022 11:58   DG C-Arm 1-60 Min-No Report  Result Date: 01/22/2022 Fluoroscopy was utilized by the requesting physician.  No radiographic interpretation.   DG C-Arm 1-60 Min-No Report  Result Date: 01/22/2022 Fluoroscopy was utilized by the requesting physician.  No radiographic interpretation.   DG C-Arm 1-60 Min-No Report  Result Date: 01/22/2022 Fluoroscopy was utilized by the requesting physician.  No radiographic interpretation.   DG C-Arm 1-60 Min-No  Report  Result Date: 01/22/2022 Fluoroscopy was utilized by the requesting physician.  No radiographic interpretation.   MR KNEE RIGHT WO CONTRAST  Result Date: 01/20/2022 CLINICAL DATA:  Knee trauma, tibial plateau fracture suspected. EXAM: MRI OF THE RIGHT KNEE WITHOUT CONTRAST TECHNIQUE: Multiplanar, multisequence MR imaging of the right knee was performed. No intravenous contrast was administered. COMPARISON:  Radiographs and CT examination dated January 19, 2022 FINDINGS: BONES: Curvilinear T1 hypointense and T2 hyperintense line in the lateral tibial plateau consistent with nondisplaced fracture. Subtle marrow edema of the lateral aspect of the lateral femoral condyle suggesting bone contusion. MENISCI Medial: Intact. Lateral: Intact. LIGAMENTS Cruciates: ACL and PCL are intact. Collaterals: Medial collateral ligament is intact. Lateral collateral ligament complex is intact. CARTILAGE Patellofemoral:  No chondral defect. Medial:  No chondral defect. Lateral:  No chondral defect. JOINT: Large knee joint effusion with fat fluid level consistent with lipohemarthrosis. Normal Hoffa's fat-pad. No plical thickening. POPLITEAL FOSSA: Popliteus tendon is intact. No Baker's cyst. EXTENSOR MECHANISM: Intact quadriceps tendon. Intact patellar tendon. Intact lateral patellar retinaculum. Intact medial patellar retinaculum. Intact MPFL. Other: Subcutaneous soft tissue edema about the medial and lateral aspect of the knee joint. No fluid collection or hematoma. Muscles are normal. IMPRESSION: 1. Nondisplaced fracture of the lateral tibial plateau. 2. Subtle marrow edema of the lateral aspect of the lateral femoral condyle suggesting bone contusion. 3. Large knee joint effusion with lipohemarthrosis. 4. Medial and lateral menisci are intact. Cruciate and collateral ligaments are intact. Quadriceps tendon and patellar tendon are also intact. Electronically Signed   By: Keane Police D.O.   On: 01/20/2022 22:41   CT  Knee Right Wo Contrast  Result Date: 01/19/2022 CLINICAL DATA:  Knee trauma occult fracture suspected EXAM: CT OF THE RIGHT KNEE WITHOUT CONTRAST TECHNIQUE: Multidetector CT imaging of the right knee was performed according to the standard protocol. Multiplanar CT image reconstructions were also generated. RADIATION DOSE REDUCTION: This exam was performed according to the departmental dose-optimization  program which includes automated exposure control, adjustment of the mA and/or kV according to patient size and/or use of iterative reconstruction technique. COMPARISON:  Radiographs of the knee FINDINGS: Bones/Joint/Cartilage No definite acute fracture. Large knee joint effusion with fat and hematocrit levels compatible with lipohemarthrosis. Ligaments Suboptimally assessed by CT. Muscles and Tendons Unremarkable CT appearance. Soft tissues Mild subcutaneous edema about the knee anterolaterally. IMPRESSION: No definite acute fracture. Large lipohemarthrosis suspicious for occult fracture. Consider MRI for further evaluation. Electronically Signed   By: Placido Sou M.D.   On: 01/19/2022 20:53   CT HIP RIGHT WO CONTRAST  Result Date: 01/19/2022 CLINICAL DATA:  Fall with right hip pain found to have right femoral fracture on radiograph. EXAM: CT OF THE RIGHT HIP WITHOUT CONTRAST TECHNIQUE: Multidetector CT imaging of the right hip was performed according to the standard protocol. Multiplanar CT image reconstructions were also generated. RADIATION DOSE REDUCTION: This exam was performed according to the departmental dose-optimization program which includes automated exposure control, adjustment of the mA and/or kV according to patient size and/or use of iterative reconstruction technique. COMPARISON:  Pelvis radiograph dated 01/19/2022, MR pelvis dated 04/22/2021 FINDINGS: Bones/Joint/Cartilage Subcapital right femoral fracture with mild apex anterior angulation. Nonunited pathologic fracture of the lateral  right superior pubic ramus is again seen. Lytic lesion of the inferior pubic ramus with cortical disruption underlying the peroneal soft tissue defect. There is heterogeneous appearance of the pubic symphysis. Ligaments Suboptimally assessed by CT. Muscles and Tendons Normal. Soft tissues Partially imaged right lower quadrant transplant kidney. Hydronephrosis. No calculi in the visualized portions. Trace pelvic free fluid. Similar mild presacral stranding or free fluid. Aortic atherosclerosis. Findings related to vulvar carcinoma are better evaluated on prior MRI and nuclear medicine PET. IMPRESSION: 1. Subcapital right femoral fracture with mild apex anterior angulation. 2. Similar Nonunited pathologic fracture of the lateral right superior pubic ramus. 3. Lytic lesion of the inferior pubic ramus with cortical disruption underlying the peroneal soft tissue defect, better evaluated on prior. 4. Findings related to vulvar carcinoma are better evaluated on prior MRI and nuclear medicine PET. 5. Aortic Atherosclerosis (ICD10-I70.0). Electronically Signed   By: Darrin Nipper M.D.   On: 01/19/2022 19:46   DG Hip Unilat W or Wo Pelvis 2-3 Views Right  Result Date: 01/19/2022 CLINICAL DATA:  Trauma, fall EXAM: DG HIP (WITH OR WITHOUT PELVIS) 2-3V RIGHT COMPARISON:  None Available. FINDINGS: There is minimal angulation in the lateral aspect of subcapital portion of neck of right femur. There is faint sclerosis in the subcapital portion of neck of right femur. There is lytic process in the right inferior pubic ramus. There is possible healing fracture in the lateral aspect of right superior pubic ramus. Arterial calcifications are seen in soft tissues. Surgical clips are seen in pelvis. IMPRESSION: There is mild cortical angulation in the lateral aspect of subcapital portion of neck of right femur suggesting possible recent fracture. Fracture line is difficult to visualize. Follow-up CT may be considered for further  evaluation. There is healing fracture in the lateral aspect of right superior pubic ramus. There is a lytic lesion in the right inferior pubic ramus which may be due to neoplastic or infectious process. Electronically Signed   By: Elmer Picker M.D.   On: 01/19/2022 13:32   DG Chest 2 View  Result Date: 01/19/2022 CLINICAL DATA:  Trauma, fall EXAM: CHEST - 2 VIEW COMPARISON:  04/20/2020 FINDINGS: Transverse diameter of heart is increased. There are no signs of pulmonary edema or  focal pulmonary consolidation. There is no pleural effusion or pneumothorax. IMPRESSION: Cardiomegaly. There are no signs of pulmonary edema or focal pulmonary consolidation. Electronically Signed   By: Elmer Picker M.D.   On: 01/19/2022 13:26   DG Knee 2 Views Right  Result Date: 01/19/2022 CLINICAL DATA:  Fall.  Right knee pain and swelling. EXAM: RIGHT KNEE - 1-2 VIEW COMPARISON:  Right knee radiographs 12/12/2021 FINDINGS: There is again mildly decreased bone mineralization. There is a new moderate-to-large joint effusion. There is some fat density within the nondependent aspect of the joint fluid, which may represent normal fat in this region just deep to the distal quadriceps tendon versus a layering fat-fluid level which would be suspicious for a lipohemarthrosis/radiographically occult fracture. No definite acute fracture is visualized. No dislocation. IMPRESSION: New moderate-to-large joint effusion. There is some fat density within the nondependent aspect of the joint fluid, which may represent normal fat in this region versus a layering fat-fluid level which would be suspicious for a lipohemarthrosis/radiographically occult fracture. Recommend clinical correlation. Electronically Signed   By: Yvonne Kendall M.D.   On: 01/19/2022 13:26     The results of significant diagnostics from this hospitalization (including imaging, microbiology, ancillary and laboratory) are listed below for reference.      Microbiology: Recent Results (from the past 240 hour(s))  Surgical pcr screen     Status: None   Collection Time: 01/21/22  9:37 PM   Specimen: Nasal Mucosa; Nasal Swab  Result Value Ref Range Status   MRSA, PCR NEGATIVE NEGATIVE Final   Staphylococcus aureus NEGATIVE NEGATIVE Final    Comment: (NOTE) The Xpert SA Assay (FDA approved for NASAL specimens in patients 31 years of age and older), is one component of a comprehensive surveillance program. It is not intended to diagnose infection nor to guide or monitor treatment. Performed at [facility], [place] [address] [place], [place] [address]      Labs: BNP (last 3 results) No results for input(s): "BNP" in the last 8760 hours. Basic Metabolic Panel: Recent Labs  Lab 01/20/22 0511 01/21/22 0405 01/23/22 0327 01/24/22 0438 01/25/22 0925  NA 140 137 137 139 140  K 4.2 4.3 4.9 4.4 4.2  CL 109 105 104 103 106  CO2 20* 17* 18* 19* 17*  GLUCOSE 126* 111* 159* 111* 172*  BUN 76* 76* 80* 79* 77*  CREATININE 6.57* 6.74* 6.64* 6.60* 6.29*  CALCIUM 8.9 8.9 8.2* 9.0 9.0  PHOS  --   --   --   --  5.5*   Liver Function Tests: Recent Labs  Lab 01/25/22 0925  ALBUMIN 2.5*   No results for input(s): "LIPASE", "AMYLASE" in the last 168 hours. No results for input(s): "AMMONIA" in the last 168 hours. CBC: Recent Labs  Lab 01/19/22 1251 01/19/22 2332 01/20/22 0511 01/21/22 0405 01/23/22 0327 01/23/22 1558 01/24/22 0438 01/25/22 0925  WBC 2.9*  --  3.9* 3.9* 3.4*  --  4.2 4.1  NEUTROABS 0.9*  --   --  1.4* 2.6  --   --   --   HGB 6.4*   < > 7.0* 9.1* 6.6* 7.5* 7.6* 7.0*  HCT 19.8*   < > 21.4* 27.5* 19.5* 21.9* 23.7* 21.0*  MCV 96.6  --  93.9 91.1 91.5  --  91.2 89.4  PLT 111*  --  103* 113* 111*  --  133* 114*   < > = values in this interval not displayed.   Cardiac Enzymes: No results  for input(s): "CKTOTAL", "CKMB", "CKMBINDEX", "TROPONINI" in the last 168 hours. BNP: Invalid input(s): "POCBNP" CBG: No  results for input(s): "GLUCAP" in the last 168 hours. D-Dimer No results for input(s): "DDIMER" in the last 72 hours. Hgb A1c No results for input(s): "HGBA1C" in the last 72 hours. Lipid Profile No results for input(s): "CHOL", "HDL", "LDLCALC", "TRIG", "CHOLHDL", "LDLDIRECT" in the last 72 hours. Thyroid function studies No results for input(s): "TSH", "T4TOTAL", "T3FREE", "THYROIDAB" in the last 72 hours.  Invalid input(s): "FREET3" Anemia work up No results for input(s): "VITAMINB12", "FOLATE", "FERRITIN", "TIBC", "IRON", "RETICCTPCT" in the last 72 hours. Urinalysis    Component Value Date/Time   COLORURINE YELLOW 05/04/2020 1805   APPEARANCEUR CLEAR 05/04/2020 1805   LABSPEC 1.015 05/04/2020 1805   PHURINE 5.5 05/04/2020 1805   GLUCOSEU 100 (A) 05/04/2020 1805   HGBUR LARGE (A) 05/04/2020 1805   BILIRUBINUR negative 11/02/2020 1037   KETONESUR negative 11/02/2020 [place] 05/04/2020 1805   PROTEINUR =100 (A) 11/02/2020 1037   PROTEINUR 30 (A) 05/04/2020 1805   UROBILINOGEN 0.2 11/02/2020 1037   NITRITE Negative 11/02/2020 1037   NITRITE NEGATIVE 05/04/2020 1805   LEUKOCYTESUR Moderate (2+) (A) 11/02/2020 1037   LEUKOCYTESUR MODERATE (A) 05/04/2020 1805   Sepsis Labs Recent Labs  Lab 01/21/22 0405 01/23/22 0327 01/24/22 0438 01/25/22 0925  WBC 3.9* 3.4* 4.2 4.1   Microbiology Recent Results (from the past 240 hour(s))  Surgical pcr screen     Status: None   Collection Time: 01/21/22  9:37 PM   Specimen: Nasal Mucosa; Nasal Swab  Result Value Ref Range Status   MRSA, PCR NEGATIVE NEGATIVE Final   Staphylococcus aureus NEGATIVE NEGATIVE Final    Comment: (NOTE) The Xpert SA Assay (FDA approved for NASAL specimens in patients 84 years of age and older), is one component of a comprehensive surveillance program. It is not intended to diagnose infection nor to guide or monitor treatment. Performed at [facility], [place] [address]  [place], [place] [address]      Time coordinating discharge:  I have spent 35 minutes face to face with the patient and on the ward discussing the patients care, assessment, plan and disposition with other care givers. >50% of the time was devoted counseling the patient about the risks and benefits of treatment/Discharge disposition and coordinating care.   SIGNED:   Damita Lack, MD  [facility] 01/25/2022, 2:04 PM   If 7PM-7AM, please contact night-coverage

## 2022-01-26 ENCOUNTER — Telehealth: Payer: Self-pay | Admitting: Orthopaedic Surgery

## 2022-01-26 NOTE — Telephone Encounter (Signed)
Called and gave verbal okay via voicemail.

## 2022-01-26 NOTE — Telephone Encounter (Signed)
Christina Mack from [facility] [phone] need verbal orders for patient.  Pt 3x for 1 week and 2x week for 3 weeks. It is ok to leave a voice mail if she does not answer

## 2022-01-27 LAB — TACROLIMUS LEVEL: Tacrolimus (FK506) - LabCorp: 3 ng/mL (ref 2.0–20.0)

## 2022-01-29 DIAGNOSIS — S82141A Displaced bicondylar fracture of right tibia, initial encounter for closed fracture: Secondary | ICD-10-CM

## 2022-01-30 ENCOUNTER — Ambulatory Visit

## 2022-01-31 ENCOUNTER — Other Ambulatory Visit: Payer: Self-pay | Admitting: Physician Assistant

## 2022-01-31 ENCOUNTER — Telehealth: Payer: Self-pay | Admitting: Orthopaedic Surgery

## 2022-01-31 MED ORDER — METHOCARBAMOL 750 MG PO TABS: 750.0000 mg | ORAL_TABLET | Freq: Three times a day (TID) | ORAL | 2 refills | Status: DC | PRN

## 2022-01-31 NOTE — Telephone Encounter (Signed)
Pt called and advised that the hydrocodone is not helping and wanted to see if Dr. Erlinda Hong may be able to call in Robaxin to help with the pain. Please call [phone]

## 2022-01-31 NOTE — Telephone Encounter (Signed)
Sent in robaxin

## 2022-01-31 NOTE — Telephone Encounter (Signed)
Dilaudid and percocet are the two medications that are stronger than norco, but chart says she is allergic.  I can send in a higher dose norco if she would like??

## 2022-01-31 NOTE — Telephone Encounter (Signed)
Notified patient.

## 2022-02-03 ENCOUNTER — Ambulatory Visit (INDEPENDENT_AMBULATORY_CARE_PROVIDER_SITE_OTHER)

## 2022-02-03 ENCOUNTER — Ambulatory Visit (INDEPENDENT_AMBULATORY_CARE_PROVIDER_SITE_OTHER): Admitting: Orthopaedic Surgery

## 2022-02-03 ENCOUNTER — Encounter: Payer: Self-pay | Admitting: Orthopaedic Surgery

## 2022-02-03 DIAGNOSIS — Z96641 Presence of right artificial hip joint: Secondary | ICD-10-CM | POA: Insufficient documentation

## 2022-02-03 DIAGNOSIS — M25561 Pain in right knee: Secondary | ICD-10-CM | POA: Diagnosis not present

## 2022-02-03 DIAGNOSIS — M25551 Pain in right hip: Secondary | ICD-10-CM

## 2022-02-03 DIAGNOSIS — S82121A Displaced fracture of lateral condyle of right tibia, initial encounter for closed fracture: Secondary | ICD-10-CM | POA: Insufficient documentation

## 2022-02-03 MED ORDER — MOXIFLOXACIN HCL 400 MG PO TABS
400.0000 mg | ORAL_TABLET | Freq: Every day | ORAL | 0 refills | Status: DC
  Filled 2022-02-17: qty 30, 30d supply, fill #0
  Filled 2022-02-17: qty 12, 12d supply, fill #0

## 2022-02-03 MED ORDER — LINEZOLID 600 MG PO TABS: 600.0000 mg | ORAL_TABLET | Freq: Two times a day (BID) | ORAL | 0 refills | Status: AC

## 2022-02-03 NOTE — Progress Notes (Signed)
Post-Op Visit Note   Patient: Christina Mack           Date of Birth: 29-May-1965           MRN: [id number] Visit Date: 02/03/2022 PCP: System, Provider Not In   Assessment & Plan:  Chief Complaint:  Chief Complaint  Patient presents with   Right Hip - Follow-up    Right total hip arthroplasty 01/22/2022   Visit Diagnoses:  1. Status post total replacement of right hip   2. Closed fracture of lateral portion of right tibial plateau, initial encounter     Plan: [name] is 2 weeks status post right total hip replacement by me and ORIF right tibial plateau fracture by Dr. Marlou Sa.  She comes in for her first postoperative appointment.  She has been at home.  She is ambulating with a walker.  She reports minimal pain to the right hip.  Overall she is pleased with her recovery so far.  Examination of the right hip and knee show healed surgical incisions without any signs of infection.  Expected postoperative swelling.  Neurovascular intact distally.  She has fluid painless range of motion of the hip.  She has some moderate decreased range of motion of the knee secondary to swelling and discomfort.  X-rays are show stable hip prosthesis and plate and screw fixation.  Christina Mack is recovering well from both surgeries.  She has been back on Eliquis.  Due to her history of pelvic osteomyelitis and the fact that it is uncertain if this has been fully eradicated we will empirically place her back on Avelox and Zyvox which she was taking prior to the hospitalization.  She is awaiting a referral to an orthopedic oncologist Dr. Cherre Blanc at [facility] for further management of the pelvic osteomyelitis.  Total joint instruction she was provided as well as handicap placard and implant card.  We will see her back in 4 weeks for recheck of her right hip and knee with two-view x-rays of the right hip and knee.  Follow-Up Instructions: Return in about 4 weeks (around 03/03/2022).   Orders:  Orders Placed This Encounter   Procedures   XR HIP UNILAT W OR W/O PELVIS 2-3 VIEWS RIGHT   XR Knee 1-2 Views Right   Meds ordered this encounter  Medications   moxifloxacin (AVELOX) 400 MG tablet    Sig: Take 1 tablet (400 mg total) by mouth daily at 8 pm.    Dispense:  42 tablet    Refill:  0   linezolid (ZYVOX) 600 MG tablet    Sig: Take 1 tablet (600 mg total) by mouth 2 (two) times daily.    Dispense:  84 tablet    Refill:  0    Imaging: XR HIP UNILAT W OR W/O PELVIS 2-3 VIEWS RIGHT  Result Date: 02/03/2022 AP and lateral x-rays of the right hip shows a stable right total hip replacement without any complications.  XR Knee 1-2 Views Right  Result Date: 02/03/2022 AP and lateral x-rays show plate and screw fixation of the tibial plateau without any complications.   PMFS History: Patient Active Problem List   Diagnosis Date Noted   Closed fracture of lateral portion of right tibial plateau 02/03/2022   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   Anemia associated with acute blood loss 01/19/2022   Symptomatic anemia 01/19/2022   Flexural atopic dermatitis 11/10/2021   At risk for adverse drug interaction 10/13/2021   Infective myositis of right  thigh 09/16/2021   Long term (current) use of antibiotics 09/16/2021   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 ([place]) 05/17/2021   History of healed fragility fracture 05/17/2021   Leg weakness, bilateral 09/15/2020   Screening for malignant neoplasm of skin 05/31/2020   Immunocompromised state due to drug therapy ([place]) 05/18/2020   Chronic kidney disease (CKD), stage IV (severe) (HCC)    Chronic pain syndrome    Cancer associated pain    Debility    Acute on chronic anemia    Normocytic anemia 04/26/2020   CKD (chronic kidney disease) stage 4, GFR 15-29 ml/min ([place]) 04/26/2020   Wound of right groin 04/26/2020   Gastric ulcer without hemorrhage or perforation 04/26/2020   Malnutrition of moderate degree 04/26/2020   Acute deep vein  thrombosis (DVT) of femoral vein ([place]) 12/05/2019   Iron deficiency anemia 11/10/2019   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([place]) 11/06/2019   Sprain of anterior talofibular ligament of right ankle 11/22/2016   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   Aftercare following organ transplant 02/24/2013   Immunosuppression ([place]) 02/24/2013   Kidney transplant status 11/22/2012   HTN (hypertension) 11/22/2012   Hyperparathyroidism, 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   Past Medical History:  Diagnosis Date   Complication of anesthesia    nausea and vomiting   DVT (deep venous thrombosis) (HCC)    History of osteomyelitis    Kidney transplant recipient    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([place])     Family History  Problem Relation Age of Onset   Cerebral aneurysm Mother    AAA (abdominal aortic aneurysm) Brother    Breast cancer Paternal Aunt    Heart attack Maternal Grandmother    Brain cancer Maternal Grandfather    Colon cancer Neg Hx    Ovarian cancer Neg Hx    Endometrial cancer Neg Hx    Pancreatic cancer Neg Hx    Prostate cancer Neg Hx     Past Surgical History:  Procedure Laterality Date   APPLICATION OF WOUND VAC Right 01/22/2022   Procedure: APPLICATION OF WOUND VAC;  Surgeon: Leandrew Koyanagi, MD;  Location: [place];  Service: Orthopedics;  Laterality: Right;   BIOPSY  04/24/2020   Procedure: BIOPSY;  Surgeon: Doran Stabler, MD;  Location: [place];  Service: Gastroenterology;;   ESOPHAGOGASTRODUODENOSCOPY (EGD) WITH PROPOFOL N/A 04/24/2020   Procedure: ESOPHAGOGASTRODUODENOSCOPY (EGD) WITH PROPOFOL;  Surgeon: Doran Stabler, MD;  Location: [place];  Service: Gastroenterology;  Laterality: N/A;   IR US GUIDE BX ASP/DRAIN  04/30/2020   KIDNEY TRANSPLANT  2001   KIDNEY TRANSPLANT  2014   LEEP     ORIF TIBIA PLATEAU Right 01/22/2022   Procedure: OPEN REDUCTION INTERNAL FIXATION (ORIF) TIBIAL PLATEAU;  Surgeon: Meredith Pel, MD;  Location: [place];  Service: Orthopedics;  Laterality: Right;   TOTAL ABDOMINAL HYSTERECTOMY     ovaries left in situ   TOTAL HIP ARTHROPLASTY Right 01/22/2022   Procedure: TOTAL HIP ARTHROPLASTY ANTERIOR APPROACH;  Surgeon: Leandrew Koyanagi, MD;  Location: [place];  Service: Orthopedics;  Laterality: Right;   Social History   Occupational History   Not on file  Tobacco Use   Smoking status: Never   Smokeless tobacco: Never  Vaping Use   Vaping Use: Never used  Substance and Sexual Activity   Alcohol use: Not Currently   Drug use: Never   Sexual activity: Not Currently

## 2022-02-07 ENCOUNTER — Encounter: Admitting: Orthopaedic Surgery

## 2022-02-17 ENCOUNTER — Other Ambulatory Visit (HOSPITAL_COMMUNITY): Payer: Self-pay

## 2022-02-18 ENCOUNTER — Other Ambulatory Visit (HOSPITAL_COMMUNITY): Payer: Self-pay

## 2022-02-20 ENCOUNTER — Other Ambulatory Visit (HOSPITAL_COMMUNITY): Payer: Self-pay

## 2022-02-21 ENCOUNTER — Telehealth: Payer: Self-pay | Admitting: *Deleted

## 2022-02-21 NOTE — Telephone Encounter (Signed)
Pt returned call regarding her PET scan being rescheduled. She is aware it is on 03/09/22 at 7:30, arrive at 7:00. NPO 6 hours prior. Pt agreed to date/time and instructions

## 2022-02-21 NOTE — Telephone Encounter (Signed)
Per Dr Berline Lopes, rescheduled her PET scan. Called and left a message to call the office back. Patient needs to be given appt date

## 2022-03-03 ENCOUNTER — Ambulatory Visit (INDEPENDENT_AMBULATORY_CARE_PROVIDER_SITE_OTHER)

## 2022-03-03 ENCOUNTER — Ambulatory Visit (INDEPENDENT_AMBULATORY_CARE_PROVIDER_SITE_OTHER): Admitting: Orthopaedic Surgery

## 2022-03-03 DIAGNOSIS — S82121A Displaced fracture of lateral condyle of right tibia, initial encounter for closed fracture: Secondary | ICD-10-CM | POA: Diagnosis not present

## 2022-03-03 DIAGNOSIS — Z96641 Presence of right artificial hip joint: Secondary | ICD-10-CM | POA: Diagnosis not present

## 2022-03-03 NOTE — Progress Notes (Signed)
Post-Op Visit Note   Patient: Christina Mack           Date of Birth: 1965/10/05           MRN: [id number] Visit Date: 03/03/2022 PCP: System, Provider Not In   Assessment & Plan:  Chief Complaint:  Chief Complaint  Patient presents with   Right Hip - Follow-up   Right Knee - Follow-up   Visit Diagnoses:  1. Status post total replacement of right hip   2. Closed fracture of lateral portion of right tibial plateau, initial encounter     Plan: Jacque is now 6 weeks status post right total hip replacement for femoral neck fracture and ORIF right tibial plateau fracture by Dr. August Saucer.  She comes in today for her second postoperative check.  She is doing well overall.  She is ambulating with a single-point cane.  No real complaints regarding the hip or the knee.  Examination of the right hip shows a fully healed surgical scar.  Minimal swelling.  There is no tenderness to palpation.  No evidence of infection.  She has fluid painless range of motion.  X-rays demonstrate stable implant in good position without any evidence of complications.  From my standpoint she is doing well and she can be released to drive.  We will continue to keep her on antibiotics until she has her consultation with Dr[facility] R[facility]e [facility] at [facility] regarding the pelvic osteomyelitis.  Please refer to Dr. August Saucer separate note for the tibial plateau fracture.  Follow-Up Instructions: Return in about 6 weeks (around 04/14/2022).   Orders:  Orders Placed This Encounter  Procedures   XR HIP UNILAT W OR W/O PELVIS 2-3 VIEWS RIGHT   XR Knee 1-2 Views Right   No orders of the defined types were placed in this encounter.   Imaging: No results found.  PMFS History: Patient Active Problem List   Diagnosis Date Noted   Closed fracture of lateral portion of right tibial plateau 02/03/2022   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   Anemia associated with acute blood loss 01/19/2022   Symptomatic anemia 01/19/2022    Flexural atopic dermatitis 11/10/2021   At risk for adverse drug interaction 10/13/2021   Infective myositis of right thigh 09/16/2021   Long term (current) use of antibiotics 09/16/2021   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 (HCC) 05/17/2021   History of healed fragility fracture 05/17/2021   Leg weakness, bilateral 09/15/2020   Screening for malignant neoplasm of skin 05/31/2020   Immunocompromised state due to drug therapy (HCC) 05/18/2020   Chronic kidney disease (CKD), stage IV (severe) (HCC)    Chronic pain syndrome    Cancer associated pain    Debility    Acute on chronic anemia    Normocytic anemia 04/26/2020   CKD (chronic kidney disease) stage 4, GFR 15-29 ml/min (HCC) 04/26/2020   Wound of right groin 04/26/2020   Gastric ulcer without hemorrhage or perforation 04/26/2020   Malnutrition of moderate degree 04/26/2020   Acute deep vein thrombosis (DVT) of femoral vein (HCC) 12/05/2019   Iron deficiency anemia 11/10/2019   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(HCC) 11/06/2019   Sprain of anterior talofibular ligament of right ankle 11/22/2016   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   Aftercare following organ transplant 02/24/2013   Immunosuppression (HCC) 02/24/2013   Kidney transplant status 11/22/2012   HTN (hypertension) 11/22/2012   Hyperparathyroidism, secondary renal (HCC) 11/22/2012   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  Past Medical History:  Diagnosis Date   Complication of anesthesia    nausea and vomiting   DVT (deep venous thrombosis) (HCC)    History of osteomyelitis    Kidney transplant recipient    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(HCC)     Family History  Problem Relation Age of Onset   Cerebral aneurysm Mother    AAA (abdominal aortic aneurysm) Brother    Breast cancer Paternal Aunt    Heart attack Maternal Grandmother    Brain cancer Maternal Grandfather    Colon cancer Neg Hx    Ovarian cancer Neg Hx    Endometrial cancer Neg Hx    Pancreatic cancer Neg Hx     Prostate cancer Neg Hx     Past Surgical History:  Procedure Laterality Date   APPLICATION OF WOUND VAC Right 01/22/2022   Procedure: APPLICATION OF WOUND VAC;  Surgeon: Tarry Kos, MD;  Location: [facility];  Service: Orthopedics;  Laterality: Right;   BIOPSY  04/24/2020   Procedure: BIOPSY;  Surgeon: Sherrilyn Rist, MD;  Location: [facility] ENDOSCOPY;  Service: Gastroenterology;;   ESOPHAGOGASTRODUODENOSCOPY (EGD) WITH PROPOFOL N/A 04/24/2020   Procedure: ESOPHAGOGASTRODUODENOSCOPY (EGD) WITH PROPOFOL;  Surgeon: Sherrilyn Rist, MD;  Location[facility];  Service: Gastroenterology;  Laterality: N/A;   IR US GUIDE BX ASP/DRAIN  04/30/2020   KIDNEY TRANSPLANT  2001   KIDNEY TRANSPLANT  2014   LEEP     ORIF TIBIA PLATEAU Right 01/22/2022   Procedure: OPEN REDUCTION INTERNAL FIXATION (ORIF) TIBIAL PLATEAU;  Surgeon: Cammy Copa, MD;  Location: [facility];  Service: Orthopedics;  Laterality: Right;   TOTAL ABDOMINAL HYSTERECTOMY     ovaries left in situ   TOTAL HIP ARTHROPLASTY Right 01/22/2022   Procedure: TOTAL HIP ARTHROPLASTY ANTERIOR APPROACH;  Surgeon: Tarry Kos, MD;  Location: [facility];  Service: Orthopedics;  Laterality: Right;   Social History   Occupational History   Not on file  Tobacco Use   Smoking status: Never   Smokeless tobacco: Never  Vaping Use   Vaping Use: Never used  Substance and Sexual Activity   Alcohol use: Not Currently   Drug use: Never   Sexual activity: Not Currently

## 2022-03-09 ENCOUNTER — Encounter (HOSPITAL_COMMUNITY)
Admission: RE | Admit: 2022-03-09 | Discharge: 2022-03-09 | Disposition: A | Discharge status: Home / Self Care | Source: Ambulatory Visit | Attending: Gynecologic Oncology | Admitting: Gynecologic Oncology

## 2022-03-09 DIAGNOSIS — C519 Malignant neoplasm of vulva, unspecified: Secondary | ICD-10-CM | POA: Diagnosis present

## 2022-03-09 LAB — GLUCOSE, CAPILLARY: Glucose-Capillary: 93 mg/dL (ref 70–99)

## 2022-03-09 MED ORDER — FLUDEOXYGLUCOSE F - 18 (FDG) INJECTION
7.5000 | Freq: Once | INTRAVENOUS | Status: AC
  Administered 2022-03-09: 6.89 via INTRAVENOUS

## 2022-03-10 ENCOUNTER — Other Ambulatory Visit: Payer: Self-pay | Admitting: Gynecologic Oncology

## 2022-03-10 ENCOUNTER — Telehealth: Payer: Self-pay | Admitting: *Deleted

## 2022-03-10 DIAGNOSIS — R600 Localized edema: Secondary | ICD-10-CM

## 2022-03-10 NOTE — Telephone Encounter (Signed)
Per Dr Berline Lopes, scheduled the patient for a doppler on 12/11 at 9 am. Patient aware

## 2022-03-13 ENCOUNTER — Ambulatory Visit (HOSPITAL_COMMUNITY)
Admission: RE | Admit: 2022-03-13 | Discharge: 2022-03-13 | Disposition: A | Discharge status: Home / Self Care | Source: Ambulatory Visit | Attending: Gynecologic Oncology | Admitting: Gynecologic Oncology

## 2022-03-13 ENCOUNTER — Telehealth: Payer: Self-pay | Admitting: Surgery

## 2022-03-13 DIAGNOSIS — R600 Localized edema: Secondary | ICD-10-CM | POA: Diagnosis present

## 2022-03-13 NOTE — Progress Notes (Signed)
VASCULAR LAB    Right lower extremity venous duplex has been performed.  See CV proc for preliminary results.  Called negative results to [facility], [facility], RVT 03/13/2022, 9:19 AM

## 2022-03-13 NOTE — Telephone Encounter (Signed)
Called patient to give her results of doppler. Patient had no further questions at this time.

## 2022-03-13 NOTE — Telephone Encounter (Signed)
Received results of doppler from Vascular Ultrasound. Doppler shows negative for DVT in R leg. Results read back, confirmed, and routed to provider.

## 2022-03-19 ENCOUNTER — Other Ambulatory Visit: Payer: Self-pay

## 2022-03-19 ENCOUNTER — Inpatient Hospital Stay (HOSPITAL_COMMUNITY)
Admission: EM | Admit: 2022-03-19 | Discharge: 2022-03-25 | DRG: 871 | Disposition: A | Discharge status: Home Health | Attending: Internal Medicine | Admitting: Internal Medicine

## 2022-03-19 ENCOUNTER — Emergency Department (HOSPITAL_COMMUNITY)

## 2022-03-19 ENCOUNTER — Inpatient Hospital Stay (HOSPITAL_COMMUNITY)

## 2022-03-19 DIAGNOSIS — L89152 Pressure ulcer of sacral region, stage 2: Secondary | ICD-10-CM | POA: Diagnosis present

## 2022-03-19 DIAGNOSIS — M868X8 Other osteomyelitis, other site: Secondary | ICD-10-CM | POA: Diagnosis present

## 2022-03-19 DIAGNOSIS — D62 Acute posthemorrhagic anemia: Secondary | ICD-10-CM | POA: Diagnosis present

## 2022-03-19 DIAGNOSIS — Z7901 Long term (current) use of anticoagulants: Secondary | ICD-10-CM

## 2022-03-19 DIAGNOSIS — A4152 Sepsis due to Pseudomonas: Principal | ICD-10-CM | POA: Diagnosis present

## 2022-03-19 DIAGNOSIS — Z940 Kidney transplant status: Secondary | ICD-10-CM | POA: Diagnosis not present

## 2022-03-19 DIAGNOSIS — R6520 Severe sepsis without septic shock: Secondary | ICD-10-CM | POA: Diagnosis present

## 2022-03-19 DIAGNOSIS — S3140XA Unspecified open wound of vagina and vulva, initial encounter: Secondary | ICD-10-CM | POA: Diagnosis present

## 2022-03-19 DIAGNOSIS — N3592 Unspecified urethral stricture, female: Secondary | ICD-10-CM | POA: Diagnosis present

## 2022-03-19 DIAGNOSIS — D61811 Other drug-induced pancytopenia: Secondary | ICD-10-CM | POA: Diagnosis present

## 2022-03-19 DIAGNOSIS — T8619 Other complication of kidney transplant: Secondary | ICD-10-CM | POA: Diagnosis present

## 2022-03-19 DIAGNOSIS — N1330 Unspecified hydronephrosis: Secondary | ICD-10-CM | POA: Diagnosis present

## 2022-03-19 DIAGNOSIS — N170 Acute kidney failure with tubular necrosis: Secondary | ICD-10-CM | POA: Diagnosis present

## 2022-03-19 DIAGNOSIS — D649 Anemia, unspecified: Secondary | ICD-10-CM

## 2022-03-19 DIAGNOSIS — Z79899 Other long term (current) drug therapy: Secondary | ICD-10-CM

## 2022-03-19 DIAGNOSIS — R52 Pain, unspecified: Secondary | ICD-10-CM

## 2022-03-19 DIAGNOSIS — D696 Thrombocytopenia, unspecified: Secondary | ICD-10-CM

## 2022-03-19 DIAGNOSIS — Z86718 Personal history of other venous thrombosis and embolism: Secondary | ICD-10-CM

## 2022-03-19 DIAGNOSIS — D84821 Immunodeficiency due to drugs: Secondary | ICD-10-CM | POA: Diagnosis present

## 2022-03-19 DIAGNOSIS — D631 Anemia in chronic kidney disease: Secondary | ICD-10-CM | POA: Diagnosis present

## 2022-03-19 DIAGNOSIS — Z515 Encounter for palliative care: Secondary | ICD-10-CM | POA: Diagnosis not present

## 2022-03-19 DIAGNOSIS — Z1152 Encounter for screening for COVID-19: Secondary | ICD-10-CM | POA: Diagnosis not present

## 2022-03-19 DIAGNOSIS — R4589 Other symptoms and signs involving emotional state: Secondary | ICD-10-CM | POA: Diagnosis not present

## 2022-03-19 DIAGNOSIS — R7881 Bacteremia: Secondary | ICD-10-CM | POA: Diagnosis not present

## 2022-03-19 DIAGNOSIS — Y830 Surgical operation with transplant of whole organ as the cause of abnormal reaction of the patient, or of later complication, without mention of misadventure at the time of the procedure: Secondary | ICD-10-CM | POA: Diagnosis present

## 2022-03-19 DIAGNOSIS — Y842 Radiological procedure and radiotherapy as the cause of abnormal reaction of the patient, or of later complication, without mention of misadventure at the time of the procedure: Secondary | ICD-10-CM | POA: Diagnosis present

## 2022-03-19 DIAGNOSIS — E876 Hypokalemia: Secondary | ICD-10-CM | POA: Diagnosis not present

## 2022-03-19 DIAGNOSIS — Z803 Family history of malignant neoplasm of breast: Secondary | ICD-10-CM

## 2022-03-19 DIAGNOSIS — N19 Unspecified kidney failure: Secondary | ICD-10-CM

## 2022-03-19 DIAGNOSIS — E872 Acidosis, unspecified: Secondary | ICD-10-CM | POA: Diagnosis present

## 2022-03-19 DIAGNOSIS — N179 Acute kidney failure, unspecified: Secondary | ICD-10-CM | POA: Diagnosis present

## 2022-03-19 DIAGNOSIS — Z885 Allergy status to narcotic agent status: Secondary | ICD-10-CM

## 2022-03-19 DIAGNOSIS — M8660 Other chronic osteomyelitis, unspecified site: Secondary | ICD-10-CM | POA: Diagnosis not present

## 2022-03-19 DIAGNOSIS — B965 Pseudomonas (aeruginosa) (mallei) (pseudomallei) as the cause of diseases classified elsewhere: Secondary | ICD-10-CM | POA: Diagnosis not present

## 2022-03-19 DIAGNOSIS — R531 Weakness: Secondary | ICD-10-CM | POA: Diagnosis not present

## 2022-03-19 DIAGNOSIS — D689 Coagulation defect, unspecified: Secondary | ICD-10-CM | POA: Diagnosis present

## 2022-03-19 DIAGNOSIS — Z923 Personal history of irradiation: Secondary | ICD-10-CM

## 2022-03-19 DIAGNOSIS — E875 Hyperkalemia: Secondary | ICD-10-CM | POA: Diagnosis present

## 2022-03-19 DIAGNOSIS — A419 Sepsis, unspecified organism: Secondary | ICD-10-CM | POA: Diagnosis not present

## 2022-03-19 DIAGNOSIS — Z1623 Resistance to quinolones and fluoroquinolones: Secondary | ICD-10-CM | POA: Diagnosis present

## 2022-03-19 DIAGNOSIS — L8990 Pressure ulcer of unspecified site, unspecified stage: Secondary | ICD-10-CM | POA: Insufficient documentation

## 2022-03-19 DIAGNOSIS — N764 Abscess of vulva: Secondary | ICD-10-CM | POA: Diagnosis present

## 2022-03-19 DIAGNOSIS — Z8249 Family history of ischemic heart disease and other diseases of the circulatory system: Secondary | ICD-10-CM

## 2022-03-19 DIAGNOSIS — G8929 Other chronic pain: Secondary | ICD-10-CM | POA: Diagnosis present

## 2022-03-19 DIAGNOSIS — Z79621 Long term (current) use of calcineurin inhibitor: Secondary | ICD-10-CM

## 2022-03-19 DIAGNOSIS — Z7189 Other specified counseling: Secondary | ICD-10-CM

## 2022-03-19 DIAGNOSIS — K254 Chronic or unspecified gastric ulcer with hemorrhage: Secondary | ICD-10-CM | POA: Diagnosis present

## 2022-03-19 DIAGNOSIS — Z881 Allergy status to other antibiotic agents status: Secondary | ICD-10-CM

## 2022-03-19 DIAGNOSIS — I120 Hypertensive chronic kidney disease with stage 5 chronic kidney disease or end stage renal disease: Secondary | ICD-10-CM | POA: Diagnosis present

## 2022-03-19 DIAGNOSIS — N186 End stage renal disease: Secondary | ICD-10-CM | POA: Diagnosis present

## 2022-03-19 DIAGNOSIS — D61818 Other pancytopenia: Secondary | ICD-10-CM | POA: Diagnosis present

## 2022-03-19 DIAGNOSIS — R338 Other retention of urine: Secondary | ICD-10-CM | POA: Diagnosis present

## 2022-03-19 DIAGNOSIS — Z823 Family history of stroke: Secondary | ICD-10-CM

## 2022-03-19 DIAGNOSIS — Z96641 Presence of right artificial hip joint: Secondary | ICD-10-CM | POA: Diagnosis present

## 2022-03-19 DIAGNOSIS — Z9221 Personal history of antineoplastic chemotherapy: Secondary | ICD-10-CM

## 2022-03-19 DIAGNOSIS — N320 Bladder-neck obstruction: Secondary | ICD-10-CM | POA: Diagnosis present

## 2022-03-19 DIAGNOSIS — Z888 Allergy status to other drugs, medicaments and biological substances status: Secondary | ICD-10-CM

## 2022-03-19 DIAGNOSIS — T368X5A Adverse effect of other systemic antibiotics, initial encounter: Secondary | ICD-10-CM | POA: Diagnosis present

## 2022-03-19 DIAGNOSIS — Z808 Family history of malignant neoplasm of other organs or systems: Secondary | ICD-10-CM

## 2022-03-19 DIAGNOSIS — Z8544 Personal history of malignant neoplasm of other female genital organs: Secondary | ICD-10-CM

## 2022-03-19 LAB — BASIC METABOLIC PANEL
Anion gap: 18 — ABNORMAL HIGH (ref 5–15)
BUN: 89 mg/dL — ABNORMAL HIGH (ref 6–20)
CO2: 9 mmol/L — ABNORMAL LOW (ref 22–32)
Calcium: 7.3 mg/dL — ABNORMAL LOW (ref 8.9–10.3)
Chloride: 112 mmol/L — ABNORMAL HIGH (ref 98–111)
Creatinine, Ser: 8.49 mg/dL — ABNORMAL HIGH (ref 0.44–1.00)
GFR, Estimated: 5 mL/min — ABNORMAL LOW (ref >60)
Glucose, Bld: 139 mg/dL — ABNORMAL HIGH (ref 70–99)
Potassium: 5.3 mmol/L — ABNORMAL HIGH (ref 3.5–5.1)
Sodium: 139 mmol/L (ref 135–145)

## 2022-03-19 LAB — FIBRINOGEN: Fibrinogen: 699 mg/dL — ABNORMAL HIGH (ref 210–475)

## 2022-03-19 LAB — PREPARE RBC (CROSSMATCH)

## 2022-03-19 LAB — RESP PANEL BY RT-PCR (RSV, FLU A&B, COVID)  RVPGX2
Influenza A by PCR: NEGATIVE
Influenza B by PCR: NEGATIVE
Resp Syncytial Virus by PCR: NEGATIVE
SARS Coronavirus 2 by RT PCR: NEGATIVE

## 2022-03-19 LAB — COMPREHENSIVE METABOLIC PANEL
ALT: 9 U/L (ref 0–44)
AST: 22 U/L (ref 15–41)
Albumin: 3.4 g/dL — ABNORMAL LOW (ref 3.5–5.0)
Alkaline Phosphatase: 59 U/L (ref 38–126)
BUN: 93 mg/dL — ABNORMAL HIGH (ref 6–20)
CO2: <7 mmol/L — ABNORMAL LOW (ref 22–32)
Calcium: 7.9 mg/dL — ABNORMAL LOW (ref 8.9–10.3)
Chloride: 111 mmol/L (ref 98–111)
Creatinine, Ser: 9.3 mg/dL — ABNORMAL HIGH (ref 0.44–1.00)
GFR, Estimated: 5 mL/min — ABNORMAL LOW (ref >60)
Glucose, Bld: 99 mg/dL (ref 70–99)
Potassium: 6.7 mmol/L (ref 3.5–5.1)
Sodium: 136 mmol/L (ref 135–145)
Total Bilirubin: 0.7 mg/dL (ref 0.3–1.2)
Total Protein: 7.7 g/dL (ref 6.5–8.1)

## 2022-03-19 LAB — CBC WITH DIFFERENTIAL/PLATELET
Abs Immature Granulocytes: 0.07 10*3/uL (ref 0.00–0.07)
Basophils Absolute: 0 10*3/uL (ref 0.0–0.1)
Basophils Relative: 1 %
Eosinophils Absolute: 0 10*3/uL (ref 0.0–0.5)
Eosinophils Relative: 0 %
HCT: 21 % — ABNORMAL LOW (ref 36.0–46.0)
Hemoglobin: 6.4 g/dL — CL (ref 12.0–15.0)
Immature Granulocytes: 2 %
Lymphocytes Relative: 31 %
Lymphs Abs: 1.1 10*3/uL (ref 0.7–4.0)
MCH: 29.4 pg (ref 26.0–34.0)
MCHC: 30.5 g/dL (ref 30.0–36.0)
MCV: 96.3 fL (ref 80.0–100.0)
Monocytes Absolute: 0.1 10*3/uL (ref 0.1–1.0)
Monocytes Relative: 3 %
Neutro Abs: 2.3 10*3/uL (ref 1.7–7.7)
Neutrophils Relative %: 63 %
Platelets: 31 10*3/uL — ABNORMAL LOW (ref 150–400)
RBC: 2.18 MIL/uL — ABNORMAL LOW (ref 3.87–5.11)
RDW: 18.2 % — ABNORMAL HIGH (ref 11.5–15.5)
WBC: 3.6 10*3/uL — ABNORMAL LOW (ref 4.0–10.5)
nRBC: 0 % (ref 0.0–0.2)

## 2022-03-19 LAB — PROTIME-INR
INR: 1.5 — ABNORMAL HIGH (ref 0.8–1.2)
Prothrombin Time: 17.6 seconds — ABNORMAL HIGH (ref 11.4–15.2)

## 2022-03-19 LAB — LACTIC ACID, PLASMA
Lactic Acid, Venous: 3.9 mmol/L (ref 0.5–1.9)
Lactic Acid, Venous: 3.3 mmol/L (ref 0.5–1.9)

## 2022-03-19 LAB — MRSA NEXT GEN BY PCR, NASAL: MRSA by PCR Next Gen: NOT DETECTED

## 2022-03-19 LAB — CBG MONITORING, ED: Glucose-Capillary: 82 mg/dL (ref 70–99)

## 2022-03-19 LAB — POC OCCULT BLOOD, ED: Fecal Occult Bld: POSITIVE — AB

## 2022-03-19 LAB — LIPASE, BLOOD: Lipase: 83 U/L — ABNORMAL HIGH (ref 11–51)

## 2022-03-19 MED ORDER — HYDROCORTISONE SOD SUC (PF) 100 MG IJ SOLR
100.0000 mg | Freq: Every day | INTRAMUSCULAR | Status: DC
  Administered 2022-03-20 – 2022-03-24 (×5): 100 mg via INTRAVENOUS
  Filled 2022-03-19 (×5): qty 2

## 2022-03-19 MED ORDER — SODIUM BICARBONATE 8.4 % IV SOLN
Freq: Once | INTRAVENOUS | Status: AC
  Filled 2022-03-19: qty 1000
  Filled 2022-03-19: qty 150

## 2022-03-19 MED ORDER — PANTOPRAZOLE SODIUM 40 MG IV SOLR
40.0000 mg | INTRAVENOUS | Status: DC
  Administered 2022-03-19: 40 mg via INTRAVENOUS
  Filled 2022-03-19: qty 10

## 2022-03-19 MED ORDER — ACETAMINOPHEN 325 MG PO TABS: 650.0000 mg | ORAL_TABLET | ORAL | Status: DC | PRN

## 2022-03-19 MED ORDER — PANTOPRAZOLE SODIUM 40 MG IV SOLR
40.0000 mg | Freq: Once | INTRAVENOUS | Status: AC
  Administered 2022-03-19: 40 mg via INTRAVENOUS
  Filled 2022-03-19: qty 10

## 2022-03-19 MED ORDER — SODIUM CHLORIDE 0.9 % IV BOLUS
1000.0000 mL | Freq: Once | INTRAVENOUS | Status: AC
  Administered 2022-03-19: 1000 mL via INTRAVENOUS

## 2022-03-19 MED ORDER — HYDROCORTISONE SOD SUC (PF) 100 MG IJ SOLR
100.0000 mg | Freq: Once | INTRAMUSCULAR | Status: AC
  Administered 2022-03-19: 100 mg via INTRAVENOUS
  Filled 2022-03-19: qty 2

## 2022-03-19 MED ORDER — POLYETHYLENE GLYCOL 3350 17 G PO PACK: 17.0000 g | PACK | Freq: Every day | ORAL | Status: DC | PRN

## 2022-03-19 MED ORDER — ORAL CARE MOUTH RINSE: 15.0000 mL | OROMUCOSAL | Status: DC | PRN

## 2022-03-19 MED ORDER — SODIUM CHLORIDE 0.9 % IV SOLN
2.0000 g | Freq: Once | INTRAVENOUS | Status: AC
  Administered 2022-03-19: 2 g via INTRAVENOUS
  Filled 2022-03-19: qty 12.5

## 2022-03-19 MED ORDER — SODIUM ZIRCONIUM CYCLOSILICATE 10 G PO PACK
10.0000 g | PACK | Freq: Once | ORAL | Status: AC
  Administered 2022-03-19: 10 g via ORAL
  Filled 2022-03-19: qty 1

## 2022-03-19 MED ORDER — SODIUM BICARBONATE 8.4 % IV SOLN
50.0000 meq | Freq: Once | INTRAVENOUS | Status: AC
  Administered 2022-03-19: 50 meq via INTRAVENOUS
  Filled 2022-03-19: qty 50

## 2022-03-19 MED ORDER — ORAL CARE MOUTH RINSE
15.0000 mL | OROMUCOSAL | Status: DC
  Administered 2022-03-19 – 2022-03-25 (×17): 15 mL via OROMUCOSAL

## 2022-03-19 MED ORDER — CHLORHEXIDINE GLUCONATE CLOTH 2 % EX PADS
6.0000 | MEDICATED_PAD | Freq: Every day | CUTANEOUS | Status: DC
  Administered 2022-03-19 – 2022-03-25 (×7): 6 via TOPICAL

## 2022-03-19 MED ORDER — SODIUM CHLORIDE 0.9 % IV SOLN
1.0000 g | INTRAVENOUS | Status: DC
  Administered 2022-03-20 – 2022-03-21 (×2): 1 g via INTRAVENOUS
  Filled 2022-03-19 (×3): qty 10

## 2022-03-19 MED ORDER — VITAMIN K1 10 MG/ML IJ SOLN
10.0000 mg | Freq: Once | INTRAVENOUS | Status: AC
  Administered 2022-03-19: 10 mg via INTRAVENOUS
  Filled 2022-03-19: qty 1

## 2022-03-19 MED ORDER — DEXTROSE 50 % IV SOLN
1.0000 | Freq: Once | INTRAVENOUS | Status: AC
  Administered 2022-03-19: 50 mL via INTRAVENOUS
  Filled 2022-03-19: qty 50

## 2022-03-19 MED ORDER — SODIUM CHLORIDE 0.9% IV SOLUTION: Freq: Once | INTRAVENOUS | Status: AC

## 2022-03-19 MED ORDER — LIP MEDEX EX OINT
TOPICAL_OINTMENT | CUTANEOUS | Status: DC | PRN
  Administered 2022-03-19 – 2022-03-20 (×2): 75 via TOPICAL
  Filled 2022-03-19: qty 7

## 2022-03-19 MED ORDER — INSULIN ASPART 100 UNIT/ML IV SOLN
5.0000 [IU] | Freq: Once | INTRAVENOUS | Status: AC
  Administered 2022-03-19: 5 [IU] via INTRAVENOUS
  Filled 2022-03-19: qty 0.05

## 2022-03-19 MED ORDER — VANCOMYCIN VARIABLE DOSE PER UNSTABLE RENAL FUNCTION (PHARMACIST DOSING): Status: DC

## 2022-03-19 MED ORDER — ONDANSETRON HCL 4 MG/2ML IJ SOLN
4.0000 mg | Freq: Four times a day (QID) | INTRAMUSCULAR | Status: DC | PRN
  Filled 2022-03-19: qty 2

## 2022-03-19 MED ORDER — VANCOMYCIN HCL IN DEXTROSE 1-5 GM/200ML-% IV SOLN
1000.0000 mg | Freq: Once | INTRAVENOUS | Status: AC
  Administered 2022-03-19: 1000 mg via INTRAVENOUS
  Filled 2022-03-19: qty 200

## 2022-03-19 MED ORDER — SODIUM CHLORIDE 0.9% IV SOLUTION: Freq: Once | INTRAVENOUS | Status: DC

## 2022-03-19 MED ORDER — DOCUSATE SODIUM 100 MG PO CAPS: 100.0000 mg | ORAL_CAPSULE | Freq: Two times a day (BID) | ORAL | Status: DC | PRN
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
## 2022-03-19 NOTE — ED Notes (Signed)
Unable to obtain oral temperature d/t dry mouth.

## 2022-03-19 NOTE — Progress Notes (Signed)
Other RN unable to obtain foley d/t abnormal anatomy and abscess of Labia tunneling into the R hip area. CCM called overhead for pain meds and for relaying info about inability to get cath and critical labs

## 2022-03-19 NOTE — ED Triage Notes (Signed)
BIB EMS from home, has been 'sick' since Thursday, she ambulated w/ assistance to stretcher w/ EMS. Reports emesis since Thursday, recent hip replacement and tib / fib fx repair. Tylenol yesterday morning. Appears pale and tachypnic.   CBG 95 BP 116/98 Sinus Tach at 102  EMS reports jaundice.   Kidney replacement 2001 and 2014, reports compliance w/ anti-rejection meds.

## 2022-03-19 NOTE — Progress Notes (Signed)
Pharmacy Antibiotic Note  Christina Mack is a 56 y.o. female admitted on 03/19/2022 with sepsis, hx of chronic pelvic osteomyelitis.  Prior to admission Linezolid and moxifloxacin have been held d/t pancytopenia.  She has AKI with SCr 9 (baseline ~6, hx renal transplant).  Pharmacy has been consulted for Vancomycin, Cefepime dosing.  Plan: Cefepime 1g IV q24h Vancomycin 1g loading dose given on 12/17 at ~15:00.  Further doses will be ordered based on levels/renal function.   Measure Vanc levels random level on 12/19 with AM labs. Follow up renal function, culture results, and clinical course.   Height: '5\' 5"'$  (165.1 cm) Weight: 62.6 kg (138 lb) IBW/kg (Calculated) : 57  Temp (24hrs), Avg:98.8 F (37.1 C), Min:98.2 F (36.8 C), Max:99.3 F (37.4 C)  Recent Labs  Lab 03/19/22 1202 03/19/22 1400  WBC 3.6*  --   CREATININE 9.30*  --   LATICACIDVEN  --  3.9*    Estimated Creatinine Clearance: 6.2 mL/min (A) (by C-G formula based on SCr of 9.3 mg/dL (H)).    Allergies  Allergen Reactions   Oxycodone-Acetaminophen Itching, Nausea Only, Other (See Comments), Nausea And Vomiting and Rash    Other Reaction: HA Other Reaction: HA    Zosyn [Piperacillin Sod-Tazobactam So] Other (See Comments)    Thrombocytopenia   Hydromorphone Nausea And Vomiting, Rash and Other (See Comments)   Amlodipine Other (See Comments)    Ankle swelling   Codeine     vomiting    Antimicrobials this admission: PTA linezolid/moxifloxacin >> 12/15 12/17 Cefepime >> 12/17 Vancomycin >>   Dose adjustments this admission: 12/19 0500 Vanc rand:  Microbiology results: 12/17 BCx:  12/17 UCx:   12/17 Resp: Covid neg; Influenza neg; RSV neg 12/17 MRSA PCR:   Thank you for allowing pharmacy to be a part of this patient's care.  Gretta Arab PharmD, BCPS [facility] main pharmacy [phone] 03/19/2022 5:22 PM

## 2022-03-19 NOTE — H&P (Signed)
NAME:  Christina Mack, MRN:  [id number], DOB:  Mar 08, 1966, LOS: 0 ADMISSION DATE:  03/19/2022, CONSULTATION DATE:  03/19/2022  REFERRING MD:  Francia Greaves, EDP, CHIEF COMPLAINT: Generalized weakness  History of Present Illness:  56 year old woman BIBEMS for generalized weakness fatigue and decreased p.o. intake for 3 days.  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 Complains of thirst  Objective   Blood pressure 136/82, pulse (!) 103, temperature 98.2 F (36.8 C), temperature source Oral, resp. rate (!) 37, height '5\' 5"'$  (1.651 m), weight 62.6 kg, SpO2 100 %.        Intake/Output Summary (Last 24 hours) at 03/19/2022 1653 Last data filed at 03/19/2022 1628 Gross per 24 hour  Intake 615 ml  Output --  Net 615 ml    Filed Weights   03/19/22 1637  Weight: 62.6 kg    Examination: General: Chronically ill-appearing middle-aged woman, NAD stretcher, mild distress HENT: Dry mucosa, dried blood and nostrils and corner of lip Lungs: Clear breath sounds bilateral, tachypnea, no accessory muscle use Cardiovascular:  S1-S2 mild tachycardia, sinus on monitor Abdomen: Soft, nontender, no guarding Extremities: No edema, no deformity Neuro: Alert oriented x 3 no asterixis, no meningeal signs   Labs show hyperkalemia 6.7, BUN/creatinine 93/9.3, lactate 3.9, pancytopenia, hemoglobin 6.4 and platelets 31 K  Resolved Hospital Problem list     Assessment & Plan:  Presentation is very suspicious for local sepsis especially given immunocompromised patient. However pancytopenia could be related to linezolid and bleeding could be related to severe thrombocytopenia, no evidence of DIC but does have evidence of bleeding from multiple sites including nose and upper GI bleed. Unfortunately, this is caused AKI and hyperkalemia  Severe sepsis -empiric antibiotics, will discontinue linezolid. Start cefepime and vancomycin, blood cultures have been obtained. Repeat lactate for clearance  Pancytopenia -STOP linezolid Transfuse 1 unit PRBC, goal hemoglobin 7 and above. Transfuse 1 unit platelets, goal platelet count 50k & above Coagulopathy -vitamin K 10 mg x 1  AKI superimposed on CKD stage IV -Renal transplant recipient which appears to be failing now and not considered a candidate for repeat transplant due to active infection Metabolic acidosis -Renal to consult -IV bicarbonate drip at 100 cc an hour -Hyperkalemia -no EKG changes, D50/insulin, bicarbonate, Lokelma x 1. Repeat BMET hopefully we can avoid emergent dialysis here -Will hold tacrolimus for 24 hours until history of sepsis is clarified, dose with stress dose steroids  Upper GI bleed -hopefully correction of platelets should help. If bleeding continues, will need to involve GI IV Protonix 40 every 12 Hold apixaban in setting of bleeding, she has a history of DVT from 2021, recent duplex was negative  Guarded prognosis given to patient and husband At Bedside  Best Practice (right click  and "Reselect all SmartList Selections" daily)   Diet/type:  full liquids  DVT prophylaxis: SCD GI prophylaxis: PPI Lines: N/A Foley:  N/A Code Status:  full code Last date of multidisciplinary goals of care discussion [NA]  Labs   CBC: Recent Labs  Lab 03/19/22 1202  WBC 3.6*  NEUTROABS 2.3  HGB 6.4*  HCT 21.0*  MCV 96.3  PLT 31*     Basic Metabolic Panel: Recent Labs  Lab 03/19/22 1202  NA 136  K 6.7*  CL 111  CO2 <7*  GLUCOSE 99  BUN 93*  CREATININE 9.30*  CALCIUM 7.9*    GFR: Estimated Creatinine Clearance: 6.2 mL/min (A) (by C-G formula based on SCr of 9.3 mg/dL (H)). Recent Labs  Lab 03/19/22 1202 03/19/22 1400  WBC 3.6*  --   LATICACIDVEN  --  3.9*     Liver Function Tests: Recent Labs  Lab 03/19/22 1202  AST 22  ALT 9  ALKPHOS 59  BILITOT 0.7  PROT 7.7  ALBUMIN 3.4*    Recent Labs  Lab 03/19/22 1202  LIPASE 83*    No results for input(s): "AMMONIA" in the last 168 hours.  ABG No results found for: "PHART", "PCO2ART", "PO2ART", "HCO3", "TCO2", "ACIDBASEDEF", "O2SAT"   Coagulation Profile: Recent Labs  Lab 03/19/22 1400  INR 1.5*     Cardiac Enzymes: No results for input(s): "CKTOTAL", "CKMB", "CKMBINDEX", "TROPONINI" in the last 168 hours.  HbA1C: No results found for: "HGBA1C"  CBG: Recent Labs  Lab 03/19/22 1455  GLUCAP 82     Review of Systems:   Generalized weakness Thirsty Shortness of breath Decreased urine output  Constitutional: negative for anorexia, fevers and sweats  Eyes: negative for irritation, redness and visual disturbance  Ears, nose, mouth, throat, and face: negative for earaches, epistaxis, nasal congestion and sore throat  Respiratory: negative for cough,  sputum and wheezing  Cardiovascular: negative for chest pain, dyspnea, lower extremity edema, orthopnea, palpitations and syncope  Gastrointestinal: negative for abdominal pain, constipation, diarrhea, melena, nausea and vomiting  Genitourinary:negative for dysuria, frequency and hematuria   Hematologic/lymphatic: negative for bleeding, easy bruising and lymphadenopathy  Musculoskeletal:negative for arthralgias, muscle weakness and stiff joints  Neurological: negative for coordination problems, gait problems, headaches and weakness  Endocrine: negative for diabetic symptoms including polydipsia, polyuria and weight loss   Past Medical History:  She,  has a past medical history of Complication of anesthesia, DVT (deep venous thrombosis) ([place]), History of osteomyelitis, Kidney transplant recipient, and 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([place]).   Surgical History:   Past Surgical History:  Procedure Laterality Date   APPLICATION OF WOUND VAC Right 01/22/2022   Procedure: APPLICATION OF WOUND VAC;  Surgeon: Leandrew Koyanagi, MD;  Location: [place];  Service: Orthopedics;  Laterality: Right;   BIOPSY  04/24/2020   Procedure: BIOPSY;  Surgeon: Doran Stabler, MD;  Location: [place];  Service: Gastroenterology;;   ESOPHAGOGASTRODUODENOSCOPY (EGD) WITH PROPOFOL N/A 04/24/2020   Procedure: ESOPHAGOGASTRODUODENOSCOPY (EGD) WITH PROPOFOL;  Surgeon: Doran Stabler, MD;  Location: [place];  Service: Gastroenterology;  Laterality: N/A;   IR US GUIDE BX ASP/DRAIN  04/30/2020   KIDNEY TRANSPLANT  2001   KIDNEY TRANSPLANT  2014   LEEP     ORIF TIBIA PLATEAU Right 01/22/2022   Procedure: OPEN REDUCTION INTERNAL FIXATION (ORIF) TIBIAL PLATEAU;  Surgeon: Meredith Pel, MD;  Location: [place];  Service: Orthopedics;  Laterality: Right;   TOTAL ABDOMINAL HYSTERECTOMY     ovaries left in  situ   TOTAL HIP ARTHROPLASTY Right 01/22/2022   Procedure: TOTAL HIP ARTHROPLASTY ANTERIOR APPROACH;  Surgeon: Leandrew Koyanagi, MD;  Location: [place];  Service: Orthopedics;  Laterality: Right;     Social History:   reports that she has never smoked. She has never used smokeless tobacco. She reports that she does not currently use alcohol. She reports that she does not use drugs.   Family History:  Her family  history includes AAA (abdominal aortic aneurysm) in her brother; Brain cancer in her maternal grandfather; Breast cancer in her paternal aunt; Cerebral aneurysm in her mother; Heart attack in her maternal grandmother. There is no history of Colon cancer, Ovarian cancer, Endometrial cancer, Pancreatic cancer, or Prostate cancer.   Allergies Allergies  Allergen Reactions   Oxycodone-Acetaminophen Itching, Nausea Only, Other (See Comments), Nausea And Vomiting and Rash    Other Reaction: HA Other Reaction: HA    Zosyn [Piperacillin Sod-Tazobactam So] Other (See Comments)    Thrombocytopenia   Hydromorphone Nausea And Vomiting, Rash and Other (See Comments)   Amlodipine Other (See Comments)    Ankle swelling   Codeine     vomiting     Home Medications  Prior to Admission medications   Medication Sig Start Date End Date Taking? Authorizing Provider  acetaminophen (TYLENOL) 500 MG tablet Take 2 tablets (1,000 mg total) by mouth 3 (three) times daily. Patient taking differently: Take 1,000 mg by mouth 3 (three) times daily as needed for moderate pain. 05/05/20  Yes Gaylan Gerold, DO  apixaban (ELIQUIS) 2.5 MG TABS tablet Take 1 tablet (2.5 mg total) by mouth 2 (two) times daily. 01/25/22  Yes Amin, Jeanella Flattery, MD  carvedilol (COREG) 25 MG tablet Take 25 mg by mouth in the morning and at bedtime. 11/29/21  Yes [provider]  HYDROcodone-acetaminophen (NORCO) 7.5-325 MG tablet Take 1-2 tablets by mouth 3 (three) times daily as needed for moderate pain. 01/22/22  Yes Leandrew Koyanagi, MD  moxifloxacin (AVELOX) 400 MG tablet Take 1 tablet (400 mg total) by mouth daily at 8 pm. 02/03/22 04/01/22 Yes Leandrew Koyanagi, MD  pantoprazole (PROTONIX) 40 MG tablet TAKE 1 TABLET (40 MG TOTAL) BY MOUTH 2 (TWO) TIMES DAILY BEFORE A MEAL. Patient taking differently: Take 40 mg by mouth 2 (two) times daily. 05/17/20 03/19/22 Yes [place], Ivan Anchors, PA-C  predniSONE (DELTASONE) 5 MG tablet Take 5 mg by mouth daily  with breakfast.   Yes [provider]  tacrolimus (PROGRAF) 1 MG capsule Take 2 mg by mouth in the morning and at bedtime.   Yes [provider]  methocarbamol (ROBAXIN-750) 750 MG tablet Take 1 tablet (750 mg total) by mouth 3 (three) times daily as needed for muscle spasms. Patient not taking: Reported on 03/19/2022 01/31/22   Aundra Dubin, PA-C     Critical care time: [place] MD. [facility]. [facility] Pager : [phone]  If no response to pager , please call [phone] until 7 pm After 7:00 pm call Elink  [phone]   03/19/2022

## 2022-03-19 NOTE — ED Provider Notes (Signed)
[facility] DEPT Provider Note   CSN: [id number] Arrival date & time: 03/19/22  1152     History  Chief Complaint  Patient presents with   Emesis   Weakness    Christina Mack is a 56 y.o. female.  56 year old female with prior medical history as detailed below presents for evaluation.  Patient arrives from home with EMS.  Patient with weakness, dry heaving, fatigue since Thursday of last week.  Patient without fever.  Patient lives at home with her husband.  Husband provides additional details.  Patient with decreased p.o. intake times 3 to 4 days.  No reported fevers.  No reported hematemesis or bloody stool.  Patient is status post renal transplant.  She is followed by both [facility] and [facility] transplant team.  She reports full compliance with previously prescribed medications including antirejection medications.  She is currently on Eliquis for treatment of DVT.  Patient with primary complaint on arrival of having a dry mouth.  The history is provided by the patient and medical records.       Home Medications Prior to Admission medications   Medication Sig Start Date End Date Taking? Authorizing Provider  acetaminophen (TYLENOL) 500 MG tablet Take 2 tablets (1,000 mg total) by mouth 3 (three) times daily. Patient taking differently: Take 1,000 mg by mouth 3 (three) times daily as needed for moderate pain. 05/05/20  Yes Gaylan Gerold, DO  apixaban (ELIQUIS) 2.5 MG TABS tablet Take 1 tablet (2.5 mg total) by mouth 2 (two) times daily. 01/25/22  Yes Amin, Jeanella Flattery, MD  carvedilol (COREG) 25 MG tablet Take 25 mg by mouth in the morning and at bedtime. 11/29/21  Yes [provider]  HYDROcodone-acetaminophen (NORCO) 7.5-325 MG tablet Take 1-2 tablets by mouth 3 (three) times daily as needed for moderate pain. 01/22/22  Yes Leandrew Koyanagi, MD  moxifloxacin (AVELOX) 400 MG tablet Take 1 tablet (400 mg total) by mouth daily at 8 pm. 02/03/22  04/01/22 Yes Leandrew Koyanagi, MD  pantoprazole (PROTONIX) 40 MG tablet TAKE 1 TABLET (40 MG TOTAL) BY MOUTH 2 (TWO) TIMES DAILY BEFORE A MEAL. Patient taking differently: Take 40 mg by mouth 2 (two) times daily. 05/17/20 03/19/22 Yes [place], Ivan Anchors, PA-C  predniSONE (DELTASONE) 5 MG tablet Take 5 mg by mouth daily with breakfast.   Yes [provider]  tacrolimus (PROGRAF) 1 MG capsule Take 2 mg by mouth in the morning and at bedtime.   Yes [provider]  methocarbamol (ROBAXIN-750) 750 MG tablet Take 1 tablet (750 mg total) by mouth 3 (three) times daily as needed for muscle spasms. Patient not taking: Reported on 03/19/2022 01/31/22   Aundra Dubin, PA-C      Allergies    Oxycodone-acetaminophen, Zosyn [piperacillin sod-tazobactam so], Hydromorphone, Amlodipine, and Codeine    Review of Systems   Review of Systems  All other systems reviewed and are negative.   Physical Exam Updated Vital Signs BP 113/63 (BP Location: Left Arm)   Pulse (!) 106   Temp 98.7 F (37.1 C) (Oral)   Resp (!) 35   SpO2 100%  Physical Exam Vitals and nursing note reviewed.  Constitutional:      General: She is not in acute distress.    Appearance: Normal appearance. She is well-developed.  HENT:     Head: Normocephalic and atraumatic.     Mouth/Throat:     Mouth: Mucous membranes are dry.     Comments: Dry  mucous membranes  Small amount of clotted blood in nares and along border of lower lip. Eyes:     Conjunctiva/sclera: Conjunctivae normal.     Pupils: Pupils are equal, round, and reactive to light.  Cardiovascular:     Rate and Rhythm: Regular rhythm. Tachycardia present.     Heart sounds: Normal heart sounds.  Pulmonary:     Effort: Pulmonary effort is normal. No respiratory distress.     Breath sounds: Normal breath sounds.     Comments: Mild tachypnea  Abdominal:     General: There is no distension.     Palpations: Abdomen is soft.     Tenderness: There is no  abdominal tenderness.  Genitourinary:    Comments: Trace BRB present on DRE - RN Chaperone present for rectal exam Musculoskeletal:        General: No deformity. Normal range of motion.     Cervical back: Normal range of motion and neck supple.  Skin:    General: Skin is warm and dry.  Neurological:     General: No focal deficit present.     Mental Status: She is alert and oriented to person, place, and time. Mental status is at baseline.     ED Results / Procedures / Treatments   Labs (all labs ordered are listed, but only abnormal results are displayed) Labs Reviewed  COMPREHENSIVE METABOLIC PANEL - Abnormal; Notable for the following components:      Result Value   Potassium 6.7 (*)    CO2 <7 (*)    BUN 93 (*)    Creatinine, Ser 9.30 (*)    Calcium 7.9 (*)    Albumin 3.4 (*)    GFR, Estimated 5 (*)    All other components within normal limits  LIPASE, BLOOD - Abnormal; Notable for the following components:   Lipase 83 (*)    All other components within normal limits  CBC WITH DIFFERENTIAL/PLATELET - Abnormal; Notable for the following components:   WBC 3.6 (*)    RBC 2.18 (*)    Hemoglobin 6.4 (*)    HCT 21.0 (*)    RDW 18.2 (*)    Platelets 31 (*)    All other components within normal limits  PROTIME-INR - Abnormal; Notable for the following components:   Prothrombin Time 17.6 (*)    INR 1.5 (*)    All other components within normal limits  POC OCCULT BLOOD, ED - Abnormal; Notable for the following components:   Fecal Occult Bld POSITIVE (*)    All other components within normal limits  RESP PANEL BY RT-PCR (RSV, FLU A&B, COVID)  RVPGX2  CULTURE, BLOOD (ROUTINE X 2)  CULTURE, BLOOD (ROUTINE X 2)  URINALYSIS, ROUTINE W REFLEX MICROSCOPIC  LACTIC ACID, PLASMA  LACTIC ACID, PLASMA  CBG MONITORING, ED  TYPE AND SCREEN  PREPARE RBC (CROSSMATCH)    EKG EKG Interpretation  Date/Time:  Sunday March 19 2022 13:36:50 EST Ventricular Rate:  102 PR  Interval:  130 QRS Duration: 166 QT Interval:  345 QTC Calculation: 450 R Axis:   65 Text Interpretation: Sinus tachycardia Probable left ventricular hypertrophy Artifact in lead(s) I II III aVR aVL aVF V1 V2 V4 V5 V6 Confirmed by Dene Gentry [phone]) on 03/19/2022 1:37:41 PM  Radiology DG Chest 2 View  Result Date: 03/19/2022 CLINICAL DATA:  Dyspnea EXAM: CHEST - 2 VIEW COMPARISON:  Chest x-ray January 19, 2022 FINDINGS: The cardiomediastinal silhouette is unchanged in contour. No focal pulmonary opacity. No pleural  effusion or pneumothorax. The visualized upper abdomen is unremarkable. No acute osseous abnormality. IMPRESSION: No acute cardiopulmonary abnormality. Electronically Signed   By: Beryle Flock M.D.   On: 03/19/2022 12:30    Procedures Procedures    Medications Ordered in ED Medications  vancomycin (VANCOCIN) IVPB 1000 mg/200 mL premix (has no administration in time range)  sodium bicarbonate 150 mEq in dextrose 5 % 1,150 mL infusion (has no administration in time range)  insulin aspart (novoLOG) injection 5 Units (has no administration in time range)    And  dextrose 50 % solution 50 mL (has no administration in time range)  0.9 %  sodium chloride infusion (Manually program via Guardrails IV Fluids) (has no administration in time range)  sodium chloride 0.9 % bolus 1,000 mL (1,000 mLs Intravenous New Bag/Given 03/19/22 1358)  ceFEPIme (MAXIPIME) 2 g in sodium chloride 0.9 % 100 mL IVPB (2 g Intravenous New Bag/Given 03/19/22 1437)  sodium zirconium cyclosilicate (LOKELMA) packet 10 g (10 g Oral Given 03/19/22 1449)  hydrocortisone sodium succinate (SOLU-CORTEF) 100 MG injection 100 mg (100 mg Intravenous Given 03/19/22 1449)  pantoprazole (PROTONIX) injection 40 mg (40 mg Intravenous Given 03/19/22 1443)    ED Course/ Medical Decision Making/ A&P                           Medical Decision Making Amount and/or Complexity of Data Reviewed Labs:  ordered.  Risk OTC drugs. Prescription drug management. Decision regarding hospitalization.    Medical Screen Complete  This patient presented to the ED with complaint of weakness.  This complaint involves an extensive number of treatment options. The initial differential diagnosis includes, but is not limited to, bacterial versus viral infection, dehydration, AKI, metabolic abnormality, etc.  This presentation is: Acute, Self-Limited, Previously Undiagnosed, Uncertain Prognosis, Complicated, Systemic Symptoms, and Threat to Life/Bodily Function  Patient is presenting with complaint of malaise, fatigue, weakness.  Patient with some degree of nausea and vomiting for the last 2 days as well.  Patient with apparent decreased p.o. intake x 3-4 days.  Patient with chronic immunosuppression, chronic renal insufficiency status post renal transplant x 2, history of chronic osteomyelitis on routine daily linezolid, and on Eliquis history of DVT.  Patient with mild tachypnea without hypoxia.  Patient with mild tachycardia without hypotension.  Patient's presentation is concerning for possible sepsis.  Broad-spectrum antibiotics administered.  Blood cultures obtained.  IV fluids administered for suspected dehydration.  Labs are remarkable for elevated potassium at 6.7, elevated creatinine at 9.3 above baseline of creatinine of 7.0.  BUN is 93 today.  Hyperkalemia treated.  Patient with history of chronic anemia.  Hemoglobin today 6.4 with a guaiac positive stool sample.  Additionally patient is noted to be thrombocytopenic with a platelet count of 31.  Patient would benefit from transfusion.  1 unit PRBCs ordered.  Patient with chronic immunosuppression.  Stress dose steroids administered.  CXR clear.   COVID, Flu, RSV negative.  Nephrology, Dr. Jonnie Finner, is aware of case.  He is comfortable with patient remaining at [facility] long.  Critical Care made aware of case and will  evaluate.   Additional history obtained:  External records from outside sources obtained and reviewed including prior ED visits and prior Inpatient records.    Lab Tests:  I ordered and personally interpreted labs.  The pertinent results include: CBC, CMP, INR, lipase, UA, COVID, flu, RSV, lactic acid   Imaging Studies ordered:  I ordered imaging  studies including chest x-ray I independently visualized and interpreted obtained imaging which showed NAD I agree with the radiologist interpretation.   Cardiac Monitoring:  The patient was maintained on a cardiac monitor.  I personally viewed and interpreted the cardiac monitor which showed an underlying rhythm of: Sinus tach   Medicines ordered:  I ordered medication including IV fluids, treatment for hyper-K, hydrocortisone, broad-spectrum antibiotics Reevaluation of the patient after these medicines showed that the patient: improved  Problem List / ED Course:  Weakness, fatigue, acute on chronic renal failure, hyperkalemia, anemia, thrombocytopenia   Reevaluation:  After the interventions noted above, I reevaluated the patient and found that they have: stayed the same   Disposition:  After consideration of the diagnostic results and the patients response to treatment, I feel that the patent would benefit from admission.   CRITICAL CARE Performed by: Valarie Merino   Total critical care time: 30 minutes  Critical care time was exclusive of separately billable procedures and treating other patients.  Critical care was necessary to treat or prevent imminent or life-threatening deterioration.  Critical care was time spent personally by me on the following activities: development of treatment plan with patient and/or surrogate as well as nursing, discussions with consultants, evaluation of patient's response to treatment, examination of patient, obtaining history from patient or surrogate, ordering and performing  treatments and interventions, ordering and review of laboratory studies, ordering and review of radiographic studies, pulse oximetry and re-evaluation of patient's condition.          Final Clinical Impression(s) / ED Diagnoses Final diagnoses:  Weakness  Renal failure, unspecified chronicity  Anemia, unspecified type  Thrombocytopenia (HCC)  Hyperkalemia    Rx / DC Orders ED Discharge Orders     None         Valarie Merino, MD 03/19/22 [phone]

## 2022-03-19 NOTE — ED Notes (Signed)
This Probation officer received a call from Lab for critical value.    Potassium 6.7.  Primary RN and EDP aware.

## 2022-03-19 NOTE — Progress Notes (Signed)
A consult was received from an ED physician for vanc/cefepime per pharmacy dosing.  The patient's profile has been reviewed for ht/wt/allergies/indication/available labs.   A one time order has been placed for vanc 1g and cefepime 2g.  Further antibiotics/pharmacy consults should be ordered by admitting physician if indicated.                       Thank you, Kara Mead 03/19/2022  1:48 PM

## 2022-03-19 NOTE — Consult Note (Signed)
NAME:  Christina Mack, MRN:  [id number], DOB:  May 16, 1965, LOS: 0 ADMISSION DATE:  03/19/2022, CONSULTATION DATE:  03/19/2022  REFERRING MD:  Francia Greaves, EDP, CHIEF COMPLAINT: Generalized weakness  History of Present Illness:  56 year old woman BIBEMS for generalized weakness fatigue and decreased p.o. intake for 3 days.  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 Complains of thirst  Objective   Blood pressure 129/69, pulse 79, temperature 98.7 F (37.1 C), temperature source Oral, resp. rate (!) 30, SpO2 100 %.        Intake/Output Summary (Last 24 hours) at 03/19/2022 1553 Last data filed at 03/19/2022 1511 Gross per 24 hour  Intake 100 ml  Output --  Net 100 ml   There were no vitals filed for this visit.  Examination: General: Chronically ill-appearing middle-aged woman, NAD stretcher, mild distress HENT: Dry mucosa, dried blood and nostrils and corner of lip Lungs: Clear breath sounds bilateral, tachypnea, no accessory muscle use Cardiovascular: S1-S2 mild tachycardia, sinus on monitor Abdomen: Soft,  nontender, no guarding Extremities: No edema, no deformity Neuro: Alert oriented x 3 no asterixis, no meningeal signs   Labs show hyperkalemia 6.7, BUN/creatinine 93/9.3, lactate 3.9, pancytopenia, hemoglobin 6.4 and platelets 31 K  Resolved Hospital Problem list     Assessment & Plan:  Presentation is very suspicious for local sepsis especially given immunocompromised patient. However pancytopenia could be related to linezolid and bleeding could be related to severe thrombocytopenia, no evidence of DIC but does have evidence of bleeding from multiple sites including nose and upper GI bleed. Unfortunately, this is caused AKI and hyperkalemia  Severe sepsis -empiric antibiotics, will discontinue linezolid. Start cefepime and vancomycin, blood cultures have been obtained. Repeat lactate for clearance  Pancytopenia -STOP linezolid Transfuse 1 unit PRBC, goal hemoglobin 7 and above. Transfuse 1 unit platelets, goal platelet count 50k & above Coagulopathy -vitamin K 10 mg x 1  AKI superimposed on CKD stage IV -Renal transplant recipient which appears to be failing now and not considered a candidate for repeat transplant due to active infection Metabolic acidosis -Renal to consult -IV bicarbonate drip at 100 cc an hour -Hyperkalemia -no EKG changes, D50/insulin, bicarbonate, Lokelma x 1. Repeat BMET hopefully we can avoid emergent dialysis here -Will hold tacrolimus for 24 hours until history of sepsis is clarified, dose with stress dose steroids  Upper GI bleed -hopefully correction of platelets should help. If bleeding continues, will need to involve GI IV Protonix 40 every 12 Hold apixaban in setting of bleeding, she has a history of DVT from 2021, recent duplex was negative  Guarded prognosis given to patient and husband At Bedside  Best Practice (right click and "Reselect all SmartList Selections" daily)   Diet/type: full liquids  DVT prophylaxis:  SCD GI prophylaxis:  PPI Lines: N/A Foley:  N/A Code Status:  full code Last date of multidisciplinary goals of care discussion [NA]  Labs   CBC: Recent Labs  Lab 03/19/22 1202  WBC 3.6*  NEUTROABS 2.3  HGB 6.4*  HCT 21.0*  MCV 96.3  PLT 31*    Basic Metabolic Panel: Recent Labs  Lab 03/19/22 1202  NA 136  K 6.7*  CL 111  CO2 <7*  GLUCOSE 99  BUN 93*  CREATININE 9.30*  CALCIUM 7.9*   GFR: Estimated Creatinine Clearance: 6.2 mL/min (A) (by C-G formula based on SCr of 9.3 mg/dL (H)). Recent Labs  Lab 03/19/22 1202 03/19/22 1400  WBC 3.6*  --   LATICACIDVEN  --  3.9*    Liver Function Tests: Recent Labs  Lab 03/19/22 1202  AST 22  ALT 9  ALKPHOS 59  BILITOT 0.7  PROT 7.7  ALBUMIN 3.4*   Recent Labs  Lab 03/19/22 1202  LIPASE 83*   No results for input(s): "AMMONIA" in the last 168 hours.  ABG No results found for: "PHART", "PCO2ART", "PO2ART", "HCO3", "TCO2", "ACIDBASEDEF", "O2SAT"   Coagulation Profile: Recent Labs  Lab 03/19/22 1400  INR 1.5*    Cardiac Enzymes: No results for input(s): "CKTOTAL", "CKMB", "CKMBINDEX", "TROPONINI" in the last 168 hours.  HbA1C: No results found for: "HGBA1C"  CBG: Recent Labs  Lab 03/19/22 1455  GLUCAP 82    Review of Systems:   Generalized weakness Thirsty Shortness of breath Decreased urine output  Constitutional: negative for anorexia, fevers and sweats  Eyes: negative for irritation, redness and visual disturbance  Ears, nose, mouth, throat, and face: negative for earaches, epistaxis, nasal congestion and sore throat  Respiratory: negative for cough,  sputum and wheezing  Cardiovascular: negative for chest pain, dyspnea, lower extremity edema, orthopnea, palpitations and syncope  Gastrointestinal: negative for abdominal pain, constipation, diarrhea, melena, nausea and vomiting  Genitourinary:negative for dysuria, frequency and hematuria  Hematologic/lymphatic: negative for bleeding, easy bruising and  lymphadenopathy  Musculoskeletal:negative for arthralgias, muscle weakness and stiff joints  Neurological: negative for coordination problems, gait problems, headaches and weakness  Endocrine: negative for diabetic symptoms including polydipsia, polyuria and weight loss   Past Medical History:  She,  has a past medical history of Complication of anesthesia, DVT (deep venous thrombosis) ([place]), History of osteomyelitis, Kidney transplant recipient, and 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([place]).   Surgical History:   Past Surgical History:  Procedure Laterality Date   APPLICATION OF WOUND VAC Right 01/22/2022   Procedure: APPLICATION OF WOUND VAC;  Surgeon: Leandrew Koyanagi, MD;  Location: [place];  Service: Orthopedics;  Laterality: Right;   BIOPSY  04/24/2020   Procedure: BIOPSY;  Surgeon: Doran Stabler, MD;  Location: [place];  Service: Gastroenterology;;   ESOPHAGOGASTRODUODENOSCOPY (EGD) WITH PROPOFOL N/A 04/24/2020   Procedure: ESOPHAGOGASTRODUODENOSCOPY (EGD) WITH PROPOFOL;  Surgeon: Doran Stabler, MD;  Location: [place];  Service: Gastroenterology;  Laterality: N/A;   IR US GUIDE BX ASP/DRAIN  04/30/2020   KIDNEY TRANSPLANT  2001   KIDNEY TRANSPLANT  2014   LEEP     ORIF TIBIA PLATEAU Right 01/22/2022   Procedure: OPEN REDUCTION INTERNAL FIXATION (ORIF) TIBIAL PLATEAU;  Surgeon: Meredith Pel, MD;  Location: [place];  Service: Orthopedics;  Laterality: Right;   TOTAL ABDOMINAL HYSTERECTOMY     ovaries left in situ   TOTAL HIP ARTHROPLASTY Right 01/22/2022   Procedure: TOTAL HIP ARTHROPLASTY ANTERIOR APPROACH;  Surgeon: Leandrew Koyanagi,  MD;  Location: [place];  Service: Orthopedics;  Laterality: Right;     Social History:   reports that she has never smoked. She has never used smokeless tobacco. She reports that she does not currently use alcohol. She reports that she does not use drugs.   Family History:  Her family history includes AAA (abdominal aortic aneurysm) in her brother;  Brain cancer in her maternal grandfather; Breast cancer in her paternal aunt; Cerebral aneurysm in her mother; Heart attack in her maternal grandmother. There is no history of Colon cancer, Ovarian cancer, Endometrial cancer, Pancreatic cancer, or Prostate cancer.   Allergies Allergies  Allergen Reactions   Oxycodone-Acetaminophen Itching, Nausea Only, Other (See Comments), Nausea And Vomiting and Rash    Other Reaction: HA Other Reaction: HA    Zosyn [Piperacillin Sod-Tazobactam So] Other (See Comments)    Thrombocytopenia   Hydromorphone Nausea And Vomiting, Rash and Other (See Comments)   Amlodipine Other (See Comments)    Ankle swelling   Codeine     vomiting     Home Medications  Prior to Admission medications   Medication Sig Start Date End Date Taking? Authorizing Provider  acetaminophen (TYLENOL) 500 MG tablet Take 2 tablets (1,000 mg total) by mouth 3 (three) times daily. Patient taking differently: Take 1,000 mg by mouth 3 (three) times daily as needed for moderate pain. 05/05/20  Yes Gaylan Gerold, DO  apixaban (ELIQUIS) 2.5 MG TABS tablet Take 1 tablet (2.5 mg total) by mouth 2 (two) times daily. 01/25/22  Yes Amin, Jeanella Flattery, MD  carvedilol (COREG) 25 MG tablet Take 25 mg by mouth in the morning and at bedtime. 11/29/21  Yes [provider]  HYDROcodone-acetaminophen (NORCO) 7.5-325 MG tablet Take 1-2 tablets by mouth 3 (three) times daily as needed for moderate pain. 01/22/22  Yes Leandrew Koyanagi, MD  moxifloxacin (AVELOX) 400 MG tablet Take 1 tablet (400 mg total) by mouth daily at 8 pm. 02/03/22 04/01/22 Yes Leandrew Koyanagi, MD  pantoprazole (PROTONIX) 40 MG tablet TAKE 1 TABLET (40 MG TOTAL) BY MOUTH 2 (TWO) TIMES DAILY BEFORE A MEAL. Patient taking differently: Take 40 mg by mouth 2 (two) times daily. 05/17/20 03/19/22 Yes Love, Ivan Anchors, PA-C  predniSONE (DELTASONE) 5 MG tablet Take 5 mg by mouth daily with breakfast.   Yes [provider]  tacrolimus  (PROGRAF) 1 MG capsule Take 2 mg by mouth in the morning and at bedtime.   Yes [provider]  methocarbamol (ROBAXIN-750) 750 MG tablet Take 1 tablet (750 mg total) by mouth 3 (three) times daily as needed for muscle spasms. Patient not taking: Reported on 03/19/2022 01/31/22   Aundra Dubin, PA-C     Critical care time: [place] MD. [facility]. [facility] Pager : [phone]  If no response to pager , please call [phone] until 7 pm After 7:00 pm call Elink  [phone]   03/19/2022

## 2022-03-19 NOTE — Progress Notes (Signed)
[facility] Progress Note Patient Name: ABBYGAYLE HELFAND DOB: [birth year] MRN: [id number]   Date of Service  03/19/2022  HPI/Events of Note  BSRN noted while attempting to place foley a significant abscess at vagina entry. Abscess extemely painful, no analgesics ordered. BSRN recommends Urology consult to place foley.  Critical labs: Hgb 6.3 (current Xmatch in BB) and platelets 29. No report of active bleed  eICU Interventions  Ordered to transfuse 1 unit PRBC Patient with history of vulvar carcinoma s/p chemoradiation which is what the team may be seeing and not an abscess. She is otherwise covered with vancomycin and cefepime No sign of urinary retention hence no urgency with foley insertion. Will have to reassess in AM need for foley Discussed with [facility]     Intervention Category Intermediate Interventions: Other:  Judd Lien 03/19/2022, 11:30 PM

## 2022-03-19 NOTE — ED Provider Triage Note (Signed)
Emergency Medicine Provider Triage Evaluation Note  Christina Mack , a 56 y.o. female  was evaluated in triage.  Pt complains of feeling unwell for the last few days, endorses severe nausea, vomiting.  History of kidney transplant.  Recent surgery about 6 weeks ago.  Denies any abdominal pain, urinary symptoms, fever, chills.  Just feels very weak and unwell..  Review of Systems  Positive: Nausea, vomiting Negative: Abdominal pain  Physical Exam  There were no vitals taken for this visit. Gen:   Awake, labored respirations, dry oral mucosa, with blood on lips.  Pale Resp:  Increased effort MSK:   Moves extremities without difficulty  Medical Decision Making  Medically screening exam initiated at 11:57 AM.  Appropriate orders placed.  Christina Mack was informed that the remainder of the evaluation will be completed by another provider, this initial triage assessment does not replace that evaluation, and the importance of remaining in the ED until their evaluation is complete.     Osvaldo Shipper, [place] 03/19/22 1203

## 2022-03-19 NOTE — Consult Note (Addendum)
Renal Service Consult Note [facility]  Christina Mack 03/19/2022 Sol Blazing, MD Requesting Physician: Dr. Francia Greaves  Reason for Consult: Renal failure HPI: The patient is a 56 y.o. year-old w/ PMH as below who presented to ED today for gen'd weakness, dry heaving/ vomiting, and fatigue since Thursday of last week. No fevers or cough, no CP, no abd pain. Decreased po intake for 3-4 days and dry mouth. Has not missed any of her renal transplant medications. In ED K+ 6.7, creat 9.3, BUN 93, CA 7.9, alb 3.4, lipase 83, LFT"s wnl, wBC 3K , Hb 6.4 and plts 31k.  BP's low normal on arrival 481-856 systolic. 100% on RA, RR 28-31, HR 105 range. CXR showed no active disease. Temp 99.3. In ED she rec'd IV insulin / glucose, IV hydrocortisone, 1 amp bicarb, bicarb IVF"s, lokelma 10 gm and 2 L bolus saline. Also IV vit K 56m, IV vanc and cefepime, and lastly IV PPI.  We are asked to see for renal failure.   [facility] is sig fatigue, gen weakness and vomiting/ dry heaves for several days. Lives w/ her husband. Has LLQ renal transplant in 2001 and had 2nd RLQ transplant in 2014 which is still functioning. [facility]  [facility] had an [facility]G or AVF.  She says that last week she was going to have access surgery but "my veins were too small". No hx PD.   ROS - denies CP, no joint pain, no HA, no blurry vision, no rash, no diarrhea, no dysuria, no difficulty voiding   Past Medical History  Past Medical History:  Diagnosis Date   Complication of anesthesia    nausea and vomiting   DVT (deep venous thrombosis) (H[facility])    History of osteomyelitis    Kidney transplant recipient    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([facility]    Past Surgical History  Past Surgical History:  Procedure Laterality Date   APPLICATION OF WOUND VAC Right 01/22/2022   Procedure: APPLICATION OF WOUND VAC;  Surgeon: XLeandrew Koyanagi MD;  [place]   Service: Orthopedics;  Laterality: Right;   BIOPSY  04/24/2020   Procedure: BIOPSY;  Surgeon: DDoran Stabler MD;  [place]  Service: Gastroenterology;;   ESOPHAGOGASTRODUODENOSCOPY (EGD) WITH PROPOFOL N/A 04/24/2020   Procedure: ESOPHAGOGASTRODUODENOSCOPY (EGD) WITH PROPOFOL;  Surgeon: DDoran Stabler MD;  [place]  Service: Gastroenterology;  Laterality: N/A;   IR [place] BX ASP/DRAIN  04/30/2020   KIDNEY TRANSPLANT  2001   KIDNEY TRANSPLANT  2014   LEEP     ORIF TIBIA PLATEAU Right 01/22/2022   Procedure: OPEN REDUCTION INTERNAL FIXATION (ORIF) TIBIAL PLATEAU;  Surgeon: DMeredith Pel MD;  [place]  Service: Orthopedics;  Laterality: Right;   TOTAL ABDOMINAL HYSTERECTOMY     ovaries left in situ   TOTAL HIP ARTHROPLASTY Right 01/22/2022   Procedure: TOTAL HIP ARTHROPLASTY ANTERIOR APPROACH;  Surgeon: XLeandrew Koyanagi MD;  [place]  Service: Orthopedics;  Laterality: Right;   Family History  Family History  Problem Relation Age of Onset   Cerebral aneurysm Mother    AAA (abdominal aortic aneurysm) Brother    Breast cancer Paternal Aunt    Heart attack Maternal Grandmother    Brain cancer Maternal Grandfather    Colon cancer Neg Hx    Ovarian cancer Neg  Hx    Endometrial cancer Neg Hx    Pancreatic cancer Neg Hx    Prostate cancer Neg Hx    Social History  reports that she has never smoked. She has never used smokeless tobacco. She reports that she does not currently use alcohol. She reports that she does not use drugs. Allergies  Allergies  Allergen Reactions   Oxycodone-Acetaminophen Itching, Nausea And Vomiting, Nausea Only, Rash and Other (See Comments)    Headaches, also    Piperacillin-Tazobactam In Dex     Other Reaction(s): Other (See Comments)  Thrombocytopenia  Thrombocytopenia   Zosyn [Piperacillin Sod-Tazobactam So] Other (See Comments)    Thrombocytopenia   Hydromorphone Nausea And Vomiting, Rash and  Other (See Comments)   Amlodipine Other (See Comments) and Swelling    Ankle swelling   Codeine     vomiting   Home medications Prior to Admission medications   Medication Sig Start Date End Date Taking? Authorizing Provider  acetaminophen (TYLENOL) 500 MG tablet Take 2 tablets (1,000 mg total) by mouth 3 (three) times daily. Patient taking differently: Take 1,000 mg by mouth 3 (three) times daily as needed for moderate pain. 05/05/20  Yes Gaylan Gerold, DO  apixaban (ELIQUIS) 2.5 MG TABS tablet Take 1 tablet (2.5 mg total) by mouth 2 (two) times daily. 01/25/22  Yes Amin, Jeanella Flattery, MD  carvedilol (COREG) 25 MG tablet Take 25 mg by mouth in the morning and at bedtime. 11/29/21  Yes [provider]  HYDROcodone-acetaminophen (NORCO) 7.5-325 MG tablet Take 1-2 tablets by mouth 3 (three) times daily as needed for moderate pain. 01/22/22  Yes Leandrew Koyanagi, MD  linezolid (ZYVOX) 600 MG tablet Take 600 mg by mouth 2 (two) times daily. 02/06/22 03/19/22 Yes [provider]  moxifloxacin (AVELOX) 400 MG tablet Take 1 tablet (400 mg total) by mouth daily at 8 pm. 02/03/22 04/01/22 Yes Xu, [name], MD  pantoprazole (PROTONIX) 40 MG tablet TAKE 1 TABLET (40 MG TOTAL) BY MOUTH 2 (TWO) TIMES DAILY BEFORE A MEAL. Patient taking differently: Take 40 mg by mouth 2 (two) times daily. 05/17/20 03/19/22 Yes Love, Ivan Anchors, PA-C  predniSONE (DELTASONE) 5 MG tablet Take 5 mg by mouth daily with breakfast.   Yes [provider]  tacrolimus (PROGRAF) 1 MG capsule Take 2 mg by mouth in the morning and at bedtime.   Yes [provider]  methocarbamol (ROBAXIN-750) 750 MG tablet Take 1 tablet (750 mg total) by mouth 3 (three) times daily as needed for muscle spasms. Patient not taking: Reported on 03/19/2022 01/31/22   Aundra Dubin, PA-C     Vitals:   03/19/22 2000 03/19/22 2005 03/19/22 2015 03/19/22 2020  BP: (!) 155/89 (!) 150/86 (!) 155/92 (!) 149/94  Pulse: 93 93 96 94   Resp: (!) 30 (!) 32 (!) 28 (!) 31  Temp:  98.5 F (36.9 C)  98.9 F (37.2 C)  TempSrc:  Oral  Oral  SpO2: 100% 100% 100% 100%  Weight:      Height:       Exam Gen alert, no distress, chron ill appearing No rash, cyanosis or gangrene Sclera anicteric, throat quite dry  No jvd or bruits Chest clear bilat to bases, no rales/ wheezing RRR no MRG Abd soft ntnd no mass or ascites +bs GU defer MS no joint effusions or deformity Ext no LE or UE edema, no wounds or ulcers Neuro is alert, Ox 3 , nf    No AVF/  AVG    Home meds include - eliquis, coreg 25 bid, norco, zyvox 600 bid, protonix, prednisone 5 qd, tacrolimus 2 mg bid, prns/ vits/ supps       Date   Creat  eGFR  05/2020  2.26  25 ml/min    09/2020  2.60  21  08/2021  2.79  20   11/18/21  5.72  8  01/03/22  8.6  5  01/10/22  6.7  7   02/17/22         7.1  6 ml/min    03/19/22   9.30       UA pend      UNa, UCr pend       Renal US - pend      CXR 12/17 - IMPRESSION: No acute cardiopulmonary abnormality.      BP's 105- 125/ 65- 75 in ED, now 150/ 85 range      SP 2 L bolus NS      Na 136  K 6.7  CO2 <7  AG not calc  BUN 93  creat 9.30  alb 3.4  LFT's ok      WBC 3K   Hb 6.4  plt 31k   Assessment/ Plan: AKI on CKD 5 - b/l creat 5.72- 7.10, from aug - nov 2023, eGFR 6- 8 ml/min. Creat today 9.30 in the setting of several days of poor po intake and recurrent vomiting/ dry heaves. UA , renal US are pending. CXR neg, no vol excess on exam. Per hx suspect pt is dry but need to be cautious w/ fluids given her tachypnea. Is SP 2L NS bolus, now is getting bicarb gtt at 100 cc/hr. Would continue. Suspect AKI due to n/v and vol depletion, vs progression to end-stage renal failure w/ uremia causing nausea/ vomiting. No neuro issues, will not proceed w/ dialysis yet. Place foley cath. See if IVF"s can help improve her renal function, although as shown above, her baseline function is very poor and it may be that we will need to start  dialysis regardless of the treatments.  Volume - looks quite dry on exam. IVF"s as above.  Anemia - likely related in some part to CKD 5. Had high tsat 95% last admission in Nov 2023, and ferritin high at 1352.  No need for IV Fe. Consider starting esa.  Pancytopenia - plts 35K, WBC low. Per pmd.   Renal transplant - in 2014. 2nd transplant. Cont prograf 2 bid and IV steroids.  Pancytopenia - per CCM they suspect this could be due to her prolonged zyvox exposure.  ID - some signs of sepsis like tachypnea and tachycardia. Started on IV vanc and cefepime. Blood / urine cx's ordered and pending.    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 Has been on zyvox and moxifloxacin po.  SP recent R THA and tibial plateau fracture Oct 2023      Taunya Splinter  MD 03/19/2022, 8:31 PM Recent Labs  Lab 03/19/22 1202  HGB 6.4*  ALBUMIN 3.4*  CALCIUM 7.9*  CREATININE 9.30*  K 6.7*   Inpatient medications:  sodium chloride   Intravenous Once   Chlorhexidine Gluconate Cloth  6 each Topical Daily   [START ON 03/20/2022] hydrocortisone sodium succinate  100 mg Intravenous Daily   mouth rinse  15 mL Mouth Rinse 4 times per day   pantoprazole (PROTONIX) IV  40 mg Intravenous Q24H   sodium bicarbonate 150 mEq in dextrose 5 % 1,150 mL infusion   Intravenous Once   vancomycin variable dose per unstable renal function (pharmacist dosing)   Does not apply See admin instructions    [START ON 03/20/2022] ceFEPime (MAXIPIME) IV     acetaminophen, docusate sodium, lip balm, ondansetron (ZOFRAN) IV, mouth rinse, polyethylene glycol

## 2022-03-19 NOTE — ED Notes (Signed)
CBG 82

## 2022-03-20 ENCOUNTER — Encounter (HOSPITAL_COMMUNITY): Payer: Self-pay | Admitting: Pulmonary Disease

## 2022-03-20 ENCOUNTER — Inpatient Hospital Stay (HOSPITAL_COMMUNITY)

## 2022-03-20 DIAGNOSIS — D61818 Other pancytopenia: Secondary | ICD-10-CM

## 2022-03-20 DIAGNOSIS — N179 Acute kidney failure, unspecified: Secondary | ICD-10-CM | POA: Diagnosis not present

## 2022-03-20 DIAGNOSIS — L8990 Pressure ulcer of unspecified site, unspecified stage: Secondary | ICD-10-CM | POA: Insufficient documentation

## 2022-03-20 LAB — BLOOD CULTURE ID PANEL (REFLEXED) - BCID2

## 2022-03-20 LAB — BPAM PLATELET PHERESIS
Blood Product Expiration Date: 202312172359
ISSUE DATE / TIME: 202312172000
Unit Type and Rh: 8400

## 2022-03-20 LAB — BASIC METABOLIC PANEL
Anion gap: 15 (ref 5–15)
BUN: 89 mg/dL — ABNORMAL HIGH (ref 6–20)
CO2: 11 mmol/L — ABNORMAL LOW (ref 22–32)
Calcium: 6.9 mg/dL — ABNORMAL LOW (ref 8.9–10.3)
Chloride: 112 mmol/L — ABNORMAL HIGH (ref 98–111)
Creatinine, Ser: 8.35 mg/dL — ABNORMAL HIGH (ref 0.44–1.00)
GFR, Estimated: 5 mL/min — ABNORMAL LOW (ref >60)
Glucose, Bld: 150 mg/dL — ABNORMAL HIGH (ref 70–99)
Potassium: 4.5 mmol/L (ref 3.5–5.1)
Sodium: 138 mmol/L (ref 135–145)

## 2022-03-20 LAB — CBC WITH DIFFERENTIAL/PLATELET
Abs Immature Granulocytes: 0.06 10*3/uL (ref 0.00–0.07)
Basophils Absolute: 0 10*3/uL (ref 0.0–0.1)
Basophils Relative: 1 %
Eosinophils Absolute: 0 10*3/uL (ref 0.0–0.5)
Eosinophils Relative: 0 %
HCT: 22.4 % — ABNORMAL LOW (ref 36.0–46.0)
Hemoglobin: 7.2 g/dL — ABNORMAL LOW (ref 12.0–15.0)
Immature Granulocytes: 6 %
Lymphocytes Relative: 28 %
Lymphs Abs: 0.3 10*3/uL — ABNORMAL LOW (ref 0.7–4.0)
MCH: 30 pg (ref 26.0–34.0)
MCHC: 32.1 g/dL (ref 30.0–36.0)
MCV: 93.3 fL (ref 80.0–100.0)
Monocytes Absolute: 0.1 10*3/uL (ref 0.1–1.0)
Monocytes Relative: 6 %
Neutro Abs: 0.6 10*3/uL — ABNORMAL LOW (ref 1.7–7.7)
Neutrophils Relative %: 59 %
Platelets: 30 10*3/uL — ABNORMAL LOW (ref 150–400)
RBC: 2.4 MIL/uL — ABNORMAL LOW (ref 3.87–5.11)
RDW: 16.8 % — ABNORMAL HIGH (ref 11.5–15.5)
WBC: 1 10*3/uL — CL (ref 4.0–10.5)
nRBC: 0 % (ref 0.0–0.2)

## 2022-03-20 LAB — URINALYSIS, ROUTINE W REFLEX MICROSCOPIC
Bacteria, UA: NONE SEEN
Bilirubin Urine: NEGATIVE
Glucose, UA: 50 mg/dL — AB
Ketones, ur: 5 mg/dL — AB
Leukocytes,Ua: NEGATIVE
Nitrite: NEGATIVE
Protein, ur: >=300 mg/dL — AB
RBC / HPF: >50 RBC/hpf — ABNORMAL HIGH (ref 0–5)
Specific Gravity, Urine: 1.012 (ref 1.005–1.030)
pH: 5 (ref 5.0–8.0)

## 2022-03-20 LAB — CBC
HCT: 19.5 % — ABNORMAL LOW (ref 36.0–46.0)
HCT: 18.8 % — ABNORMAL LOW (ref 36.0–46.0)
Hemoglobin: 6.3 g/dL — CL (ref 12.0–15.0)
Hemoglobin: 6.1 g/dL — CL (ref 12.0–15.0)
MCH: 30.6 pg (ref 26.0–34.0)
MCH: 29.8 pg (ref 26.0–34.0)
MCHC: 32.3 g/dL (ref 30.0–36.0)
MCHC: 32.4 g/dL (ref 30.0–36.0)
MCV: 94.7 fL (ref 80.0–100.0)
MCV: 91.7 fL (ref 80.0–100.0)
Platelets: 29 10*3/uL — CL (ref 150–400)
Platelets: 23 10*3/uL — CL (ref 150–400)
RBC: 2.06 MIL/uL — ABNORMAL LOW (ref 3.87–5.11)
RBC: 2.05 MIL/uL — ABNORMAL LOW (ref 3.87–5.11)
RDW: 17.3 % — ABNORMAL HIGH (ref 11.5–15.5)
RDW: 17 % — ABNORMAL HIGH (ref 11.5–15.5)
WBC: 1.5 10*3/uL — ABNORMAL LOW (ref 4.0–10.5)
WBC: 1.3 10*3/uL — CL (ref 4.0–10.5)
nRBC: 0 % (ref 0.0–0.2)
nRBC: 0 % (ref 0.0–0.2)

## 2022-03-20 LAB — PREPARE PLATELET PHERESIS: Unit division: 0

## 2022-03-20 LAB — CREATININE, URINE, RANDOM: Creatinine, Urine: 117 mg/dL

## 2022-03-20 LAB — SODIUM, URINE, RANDOM: Sodium, Ur: 52 mmol/L

## 2022-03-20 LAB — C DIFFICILE QUICK SCREEN W PCR REFLEX
C Diff antigen: NEGATIVE
C Diff interpretation: NOT DETECTED
C Diff toxin: NEGATIVE

## 2022-03-20 LAB — HEMOGLOBIN AND HEMATOCRIT, BLOOD
HCT: 22.6 % — ABNORMAL LOW (ref 36.0–46.0)
Hemoglobin: 7.4 g/dL — ABNORMAL LOW (ref 12.0–15.0)

## 2022-03-20 MED ORDER — PANTOPRAZOLE SODIUM 40 MG IV SOLR
40.0000 mg | Freq: Two times a day (BID) | INTRAVENOUS | Status: DC
  Administered 2022-03-20 – 2022-03-25 (×11): 40 mg via INTRAVENOUS
  Filled 2022-03-20 (×11): qty 10

## 2022-03-20 MED ORDER — SODIUM CHLORIDE 0.9% IV SOLUTION: Freq: Once | INTRAVENOUS | Status: AC

## 2022-03-20 MED ORDER — FENTANYL CITRATE PF 50 MCG/ML IJ SOSY
12.5000 ug | PREFILLED_SYRINGE | Freq: Once | INTRAMUSCULAR | Status: AC
  Administered 2022-03-21: 12.5 ug via INTRAVENOUS
  Filled 2022-03-20: qty 1

## 2022-03-20 MED ORDER — ACETAMINOPHEN 325 MG PO TABS
650.0000 mg | ORAL_TABLET | ORAL | Status: DC | PRN
  Administered 2022-03-20 – 2022-03-25 (×9): 650 mg via ORAL
  Filled 2022-03-20 (×9): qty 2

## 2022-03-20 MED ORDER — IOHEXOL 9 MG/ML PO SOLN: 500.0000 mL | ORAL | Status: AC

## 2022-03-20 MED ORDER — IOHEXOL 9 MG/ML PO SOLN
ORAL | Status: AC
  Filled 2022-03-20: qty 1000

## 2022-03-20 NOTE — TOC Initial Note (Signed)
Transition of Care [facility]) - Initial/Assessment Note    Patient Details  Name: Christina Mack MRN: [id number] Date of Birth: 1965/06/23  Transition of Care [facility]) CM/SW Contact:    Roseanne Kaufman, RN Phone Number: 03/20/2022, 6:05 PM  Clinical Narrative:  TOC will continue follow for discharge needs.                       Patient Goals and CMS Choice        Expected Discharge Plan and Services                                                Prior Living Arrangements/Services                       Activities of Daily Living Home Assistive Devices/Equipment: None ADL Screening (condition at time of admission) Patient's cognitive ability adequate to safely complete daily activities?: Yes Is the patient deaf or have difficulty hearing?: No Does the patient have difficulty seeing, even when wearing glasses/contacts?: No Does the patient have difficulty concentrating, remembering, or making decisions?: No Patient able to express need for assistance with ADLs?: Yes Does the patient have difficulty dressing or bathing?: No Independently performs ADLs?: Yes (appropriate for developmental age) Does the patient have difficulty walking or climbing stairs?: No Weakness of Legs: Right Weakness of Arms/Hands: None  Permission Sought/Granted                  Emotional Assessment              Admission diagnosis:  Hyperkalemia [E87.5] Weakness [R53.1] Thrombocytopenia (HCC) [D69.6] AKI (acute kidney injury) ([place]) [N17.9] Anemia, unspecified type [D64.9] Renal failure, unspecified chronicity [N19] Patient Active Problem List   Diagnosis Date Noted   Pressure injury of skin 03/20/2022   AKI (acute kidney injury) ([place]) 03/19/2022   Closed fracture of lateral portion of right tibial plateau 02/03/2022   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   Anemia associated with acute blood loss 01/19/2022   Pancytopenia ([place]) 01/19/2022    Flexural atopic dermatitis 11/10/2021   At risk for adverse drug interaction 10/13/2021   Infective myositis of right thigh 09/16/2021   Long term (current) use of antibiotics 09/16/2021   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 ([place]) 05/17/2021   History of healed fragility fracture 05/17/2021   Leg weakness, bilateral 09/15/2020   Screening for malignant neoplasm of skin 05/31/2020   Immunocompromised state due to drug therapy ([place]) 05/18/2020   Chronic kidney disease (CKD), stage IV (severe) (HCC)    Chronic pain syndrome    Cancer associated pain    Debility    Acute on chronic anemia    Normocytic anemia 04/26/2020   CKD (chronic kidney disease) stage 4, GFR 15-29 ml/min ([place]) 04/26/2020   Wound of right groin 04/26/2020   Gastric ulcer without hemorrhage or perforation 04/26/2020   Malnutrition of moderate degree 04/26/2020   Acute deep vein thrombosis (DVT) of femoral vein ([place]) 12/05/2019   Iron deficiency anemia 11/10/2019   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([place]) 11/06/2019   Sprain of anterior talofibular ligament of right ankle 11/22/2016   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Phone: [phone]: [phone]    Social Determinants of Health (SDOH) Interventions    Readmission Risk Interventions      No data to display

## 2022-03-20 NOTE — Progress Notes (Signed)
[place] Progress Note Patient Name: AMBUR PROVINCE DOB: 1965/11/18 MRN: [id number]   Date of Service  03/20/2022  HPI/Events of Note  C. Difficile toxin and antigen both negative.  eICU Interventions  Will D/C enteric precautions.      Intervention Category Major Interventions: Other:  Lysle Dingwall 03/20/2022, 9:31 PM

## 2022-03-20 NOTE — Progress Notes (Signed)
PCCM Brief Note  Approached by RN stating pt has continued to have multiple loose watery stools throughout the day. Long hx of abx use. Certainly at risk for C.diff.  Will send C.Diff panel and place on contact precautions for now.   Montey Hora, [facility] For pager details, please see AMION or use Epic chat  After 1900, please call [facility] for cross coverage needs 03/20/2022, 6:48 PM

## 2022-03-20 NOTE — Progress Notes (Signed)
PHARMACY - PHYSICIAN COMMUNICATION CRITICAL VALUE ALERT - BLOOD CULTURE IDENTIFICATION (BCID)  Christina Mack is an 56 y.o. female who presented to [facility] on 03/19/2022 with a chief complaint of emesis and weakness.  Admitted for severe sepsis and pancytopenia.   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 Maintained on linezolid and moxifloxacin prior to admission Positive blood culture in 1 of 4 bottles, anaerobic bottle, with gram neg rods. BCID detected Pseudomonas Aeruginosa, no resistance.  Name of physician (or Provider) Contacted: Candiss Norse, Jerilynn Mages  Current antibiotics: Cefepime and vancomycin  Changes to prescribed antibiotics recommended:  Continue cefepime and also vancomycin for now  Results for orders placed or performed during the hospital encounter of 03/19/22  Blood Culture ID Panel (Reflexed) (Collected: 03/19/2022  2:00 PM)  Result Value Ref Range   Enterococcus faecalis NOT DETECTED NOT DETECTED   Enterococcus Faecium NOT DETECTED NOT DETECTED   Listeria monocytogenes NOT DETECTED NOT DETECTED   Staphylococcus species NOT DETECTED NOT DETECTED   Staphylococcus aureus (BCID) NOT DETECTED NOT DETECTED   Staphylococcus epidermidis NOT DETECTED NOT DETECTED   Staphylococcus lugdunensis NOT DETECTED NOT DETECTED   Streptococcus species NOT DETECTED NOT DETECTED   Streptococcus agalactiae NOT DETECTED NOT DETECTED   Streptococcus pneumoniae NOT DETECTED NOT DETECTED   Streptococcus pyogenes NOT DETECTED NOT DETECTED   A.calcoaceticus-baumannii NOT DETECTED NOT DETECTED   Bacteroides fragilis NOT DETECTED NOT DETECTED   Enterobacterales NOT DETECTED NOT DETECTED   Enterobacter cloacae complex NOT DETECTED NOT DETECTED   Escherichia coli NOT DETECTED NOT DETECTED   Klebsiella aerogenes NOT DETECTED NOT DETECTED   Klebsiella oxytoca NOT DETECTED NOT  DETECTED   Klebsiella pneumoniae NOT DETECTED NOT DETECTED   Proteus species NOT DETECTED NOT DETECTED   Salmonella species NOT DETECTED NOT DETECTED   Serratia marcescens NOT DETECTED NOT DETECTED   Haemophilus influenzae NOT DETECTED NOT DETECTED   Neisseria meningitidis NOT DETECTED NOT DETECTED   Pseudomonas aeruginosa DETECTED (A) NOT DETECTED   Stenotrophomonas maltophilia NOT DETECTED NOT DETECTED   Candida albicans NOT DETECTED NOT DETECTED   Candida auris NOT DETECTED NOT DETECTED   Candida glabrata NOT DETECTED NOT DETECTED   Candida krusei NOT DETECTED NOT DETECTED   Candida parapsilosis NOT DETECTED NOT DETECTED   Candida tropicalis NOT DETECTED NOT DETECTED   Cryptococcus neoformans/gattii NOT DETECTED NOT DETECTED   CTX-M ESBL NOT DETECTED NOT DETECTED   Carbapenem resistance IMP NOT DETECTED NOT DETECTED   Carbapenem resistance KPC NOT DETECTED NOT DETECTED   Carbapenem resistance NDM NOT DETECTED NOT DETECTED   Carbapenem resistance VIM NOT DETECTED NOT DETECTED    Suzzanne Cloud, PharmD, BCPS 03/20/2022  1:54 PM

## 2022-03-20 NOTE — Progress Notes (Signed)
[place] Progress Note Patient Name: Christina Mack DOB: [birth year] MRN: [id number]   Date of Service  03/20/2022  HPI/Events of Note  Patient experiencing vulvar pain  eICU Interventions  Tylenol revised to include moderate pain as an indication        Judd Lien 03/20/2022, 12:49 AM

## 2022-03-20 NOTE — Progress Notes (Signed)
Patient ID: Christina Mack, female   DOB: September 15, 1965, 56 y.o.   MRN: [id number] S: Seen in her room and getting platelet transfusion.  Feeling better today but remains weak. O:BP (!) 164/93   Pulse 87   Temp 98.6 F (37 C) (Oral)   Resp (!) 25   Ht _0  (1.651 m)   Wt 64.1 kg   SpO2 100%   BMI 23.52 kg/m   Intake/Output Summary (Last 24 hours) at 03/20/2022 1140 Last data filed at 03/20/2022 1040 Gross per 24 hour  Intake 3048.2 ml  Output 150 ml  Net 2898.2 ml   Intake/Output: I/O last 3 completed shifts: In: 3048.2 [I.V.:414.1; Blood:1374.9; IV Piggyback:1259.2] Out: 100 [Urine:100]  Intake/Output this shift:  Total I/O In: -  Out: 50 [Urine:50] Weight change:  Gen: NAD CVS: RRR Resp:CTA Abd: +BS, soft, NT/ND Ext: no edema  Recent Labs  Lab 03/19/22 1202 03/19/22 2157 03/20/22 0304  NA 136 139 138  K 6.7* 5.3* 4.5  CL 111 112* 112*  CO2 <7* 9* 11*  GLUCOSE 99 139* 150*  BUN 93* 89* 89*  CREATININE 9.30* 8.49* 8.35*  ALBUMIN 3.4*  --   --   CALCIUM 7.9* 7.3* 6.9*  AST 22  --   --   ALT 9  --   --    Liver Function Tests: Recent Labs  Lab 03/19/22 1202  AST 22  ALT 9  ALKPHOS 59  BILITOT 0.7  PROT 7.7  ALBUMIN 3.4*   Recent Labs  Lab 03/19/22 1202  LIPASE 83*   No results for input(s): "AMMONIA" in the last 168 hours. CBC: Recent Labs  Lab 03/19/22 1202 03/19/22 2157 03/20/22 0304 03/20/22 0843  WBC 3.6* 1.5* 1.3*  --   NEUTROABS 2.3  --   --   --   HGB 6.4* 6.3* 6.1* 7.4*  HCT 21.0* 19.5* 18.8* 22.6*  MCV 96.3 94.7 91.7  --   PLT 31* 29* 23*  --    Cardiac Enzymes: No results for input(s): "CKTOTAL", "CKMB", "CKMBINDEX", "TROPONINI" in the last 168 hours. CBG: Recent Labs  Lab 03/19/22 1455  GLUCAP 82    Iron Studies: No results for input(s): "IRON", "TIBC", "TRANSFERRIN", "FERRITIN" in the last 72 hours. Studies/Results: US Renal Transplant w/Doppler  Result Date: 03/19/2022 CLINICAL DATA:  Acute on chronic renal failure  EXAM: ULTRASOUND OF RENAL TRANSPLANT WITH RENAL DOPPLER ULTRASOUND TECHNIQUE: Ultrasound examination of the renal transplant was performed with gray-scale, color and duplex doppler evaluation. COMPARISON:  None Available. FINDINGS: Transplant kidney location: RLQ Transplant Kidney: Renal measurements: 11.2 x 5.3 x 6.4 cm = volume: 237m. Normal in size and parenchymal echogenicity. Moderate hydronephrosis. No peri-transplant fluid collection seen. Color flow in the main renal artery:  Yes Color flow in the main renal vein:  Yes Duplex Doppler Evaluation: Main Renal Artery Velocity: 27.7 cm/sec Main Renal Artery Resistive Index: 0.7 Venous waveform in main renal vein:  Present Intrarenal resistive index in upper pole:  0.5 (normal 0.6-0.8; equivocal 0.8-0.9; abnormal >= 0.9) Intrarenal resistive index in lower pole: 0.5 (normal 0.6-0.8; equivocal 0.8-0.9; abnormal >= 0.9) Bladder: Normal for degree of bladder distention. Other findings:  None. IMPRESSION: Right lower quadrant renal transplant with moderate hydronephrosis. Patent transplant vasculature with normal resistive indices. Electronically Signed   By: SJulian HyM.D.   On: 03/19/2022 23:07   DG Chest 2 View  Result Date: 03/19/2022 CLINICAL DATA:  Dyspnea EXAM: CHEST - 2 VIEW COMPARISON:  Chest x-ray January 19, 2022 FINDINGS: The cardiomediastinal silhouette is unchanged in contour. No focal pulmonary opacity. No pleural effusion or pneumothorax. The visualized upper abdomen is unremarkable. No acute osseous abnormality. IMPRESSION: No acute cardiopulmonary abnormality. Electronically Signed   By: Beryle Flock M.D.   On: 03/19/2022 12:30    sodium chloride   Intravenous Once   Chlorhexidine Gluconate Cloth  6 each Topical Daily   hydrocortisone sodium succinate  100 mg Intravenous Daily   mouth rinse  15 mL Mouth Rinse 4 times per day   pantoprazole (PROTONIX) IV  40 mg Intravenous Q12H   vancomycin variable dose per unstable renal  function (pharmacist dosing)   Does not apply See admin instructions    BMET    Component Value Date/Time   NA 138 03/20/2022 0304   NA 139 11/26/2012 1758   K 4.5 03/20/2022 0304   K 4.6 11/26/2012 1758   CL 112 (H) 03/20/2022 0304   CL 105 11/26/2012 1758   CO2 11 (L) 03/20/2022 0304   CO2 22 11/26/2012 1758   GLUCOSE 150 (H) 03/20/2022 0304   GLUCOSE 88 11/26/2012 1758   BUN 89 (H) 03/20/2022 0304   BUN 33 (H) 11/26/2012 1758   CREATININE 8.35 (H) 03/20/2022 0304   CREATININE 2.93 (H) 06/08/2020 1544   CALCIUM 6.9 (L) 03/20/2022 0304   CALCIUM 9.7 11/26/2012 1758   GFRNONAA 5 (L) 03/20/2022 0304   GFRNONAA 19 (L) 11/26/2012 1758   GFRAA 22 (L) 11/26/2012 1758   CBC    Component Value Date/Time   WBC 1.3 (LL) 03/20/2022 0304   RBC 2.05 (L) 03/20/2022 0304   HGB 7.4 (L) 03/20/2022 0843   HGB 10.5 (L) 11/26/2012 1758   HCT 22.6 (L) 03/20/2022 0843   HCT 31.4 (L) 11/26/2012 1758   PLT 23 (LL) 03/20/2022 0304   PLT 190 11/26/2012 1758   MCV 91.7 03/20/2022 0304   MCV 89 11/26/2012 1758   MCH 29.8 03/20/2022 0304   MCHC 32.4 03/20/2022 0304   RDW 17.0 (H) 03/20/2022 0304   RDW 13.2 11/26/2012 1758   LYMPHSABS 1.1 03/19/2022 1202   LYMPHSABS 2.4 11/26/2012 1758   MONOABS 0.1 03/19/2022 1202   MONOABS 0.7 11/26/2012 1758   EOSABS 0.0 03/19/2022 1202   EOSABS 0.1 11/26/2012 1758   BASOSABS 0.0 03/19/2022 1202   BASOSABS 0.0 11/26/2012 1758     Assessment/ Plan: AKI on CKD 5 - b/l creat 5.72- 7.10, from aug - nov 2023, eGFR 6- 8 ml/min. Creat 9.30 on admission.  AKI likely multifactorial with ischemic ATN in setting of several days of poor po intake and recurrent vomiting/ dry heaves as well as ABLA due to GI bleed. UA  with large blood and protein.  Transplant renal US with moderate hydronephrosis but normal resistive indices.  CXR neg, no vol excess on exam. She is  now getting bicarb gtt at 100 cc/hr. Would continue. Suspect AKI due to n/v and vol depletion, vs  progression to end-stage renal failure w/ uremia causing nausea/ vomiting. No neuro issues, will not proceed w/ dialysis yet. Place foley cath. IVF"s have improved BUN/Cr from admission but still with low UOP.  Her baseline function is very poor and it may be that we will need to start dialysis regardless of the treatments. I did discuss the eventual need to resume dialysis which she acknowledges, however despite a low eGFR, she had not had vascular access placed in preparation.  She was referred to [facility] for a third transplant, however  is high risk due to h/o ongoing osteomyelitis.   Avoid nephrotoxic medications including NSAIDs and iodinated intravenous contrast exposure unless the latter is absolutely indicated.   Preferred narcotic agents for pain control are hydromorphone, fentanyl, and methadone. Morphine should not be used.  Avoid Baclofen and avoid oral sodium phosphate and magnesium citrate based laxatives / bowel preps.  Continue strict Input and Output monitoring. Will monitor the patient closely with you and intervene or adjust therapy as indicated by changes in clinical status/labs  Volume - looks quite dry on exam. IVF"s as above.  Acute blood loss anemia on Anemia of CKD stage 5- she was transfused 1 unit PRBC early this morning.  Had high tsat 95% last admission in Nov 2023, and ferritin high at 1352.  No need for IV Fe. Consider starting esa.  Pancytopenia - plts 35K, WBC low. Per pmd.  Getting platelet transfusion due to GI bleed.   Renal transplant - in 2014. 2nd transplant. Cont prograf 2 bid and IV steroids.  Pancytopenia - per CCM they suspect this could be due to her prolonged zyvox exposure.  ID - some signs of sepsis like tachypnea and tachycardia. Started on IV vanc and cefepime. Blood / urine cx's ordered and pending.    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 Has  been on zyvox and moxifloxacin po.  SP recent R THA and tibial plateau fracture Oct 2023  Donetta Potts, MD [facility]

## 2022-03-20 NOTE — Progress Notes (Signed)
NAME:  Christina Mack, MRN:  [id number], DOB:  24-Feb-1966, LOS: 1 ADMISSION DATE:  03/19/2022, CONSULTATION DATE:  03/20/2022  REFERRING MD:  Francia Greaves, EDP, CHIEF COMPLAINT: Generalized weakness  History of Present Illness:  56 year old woman BIBEMS for generalized weakness fatigue and decreased p.o. intake for 3 days.  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 No complaints of bleeding. 1u PRBC transfused early this AM. Had pain overnight in vulva area, states its the same pain she chronically has.  Objective   Blood pressure 120/74, pulse 76, temperature 98.9 F (37.2 C), temperature source Oral, resp. rate 16, height '5\' 5"'$  (1.651 m), weight 64.1 kg, SpO2 99 %.        Intake/Output Summary (Last 24 hours) at 03/20/2022 0817 Last data filed at 03/20/2022 0542 Gross per 24 hour  Intake 3048.2 ml  Output 100 ml  Net 2948.2 ml    Filed Weights   03/19/22 1637 03/19/22 1839 03/20/22 0500  Weight: 62.6 kg 62.4 kg 64.1 kg    Examination: General: Adult female, resting in bed, in NAD. Neuro: A&O x 3, no deficits. HEENT: [place]/AT. Sclerae  anicteric. EOMI. Cardiovascular: RRR, no M/R/G.  Lungs: Respirations even and unlabored.  CTA bilaterally, No W/R/R. Abdomen: BS x 4, soft, NT/ND.  Musculoskeletal: No gross deformities, no edema.  Skin: Right inguinal/groin wound per report. Skin otherwise warm, no rashes. GU: Deferred, chronic right inguinal/groin/vulva wound down to bone per pt.   Assessment & Plan:   Sepsis - unclear etiology. On chronic Linezolid and Moxifloxacin for chronic right inguinal/groin/vulva wound with hx osteo.  - On empiric Vanc, Cefepime. - Home Linezolid has been held. - Will ask ID to see.  Pancytopenia - likely 2/2 long term Linezolid use. S/p 2u PRBC, 1u Platelets, '10mg'$  Vitamin K. - Holding Linezolid. - Goal Hgb > 7. - Goal platelet count 50k & above. - Monitor for bleeding. - ID to see as above.  AKI superimposed on CKD stage IV - s/p renal transplant 2001 and 2014,  appears to be failing now and not considered a candidate for repeat transplant due to ongoing active infection. Metabolic acidosis - Nephrology following, appreciate the assistance. - Continue Steroids. - Will need to discuss holding vs continuing Tacrolimus with Nephrology.  ? Upper GI bleed - improved, no signs of bleeding at this point. - Supportive care. - Continue PPI but caution with platelet counts. - Hold apixaban in setting of bleeding, she has a history of DVT from 2021, recent duplex was negative.  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 - F/u as able. - Will ask ID to follow in the interim.  Best Practice (right click and "Reselect all SmartList Selections" daily)  Diet/type: Regular consistency (see orders) DVT prophylaxis: SCD GI prophylaxis: PPI Lines: N/A Foley:  N/A Code Status:  full code Last date of multidisciplinary goals of care discussion [NA]  Critical care time: N/A    Montey Hora, [place] For pager details, please see AMION or use Epic chat  After 1900, please call [place] for cross coverage needs 03/20/2022, 9:04 AM

## 2022-03-20 NOTE — Consult Note (Signed)
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  Maintained on linezolid and moxifloxacin admitted for severe sepsis and pancytopenia. #Pancytopenia in setting of linezolid #Pelvic osteomyelitis and open groin wound #SP R hip total arthoplasty #Hx of renal transplant x2 #Vulvar carcinoma in 2021 was status postchemotherapy and radiation     with wound right total hip arthroplasty on 01/2022 maintained on linezolid moxifloxacin - She is followed by infectious disease, [name] last seen on 9/19 for osteomyelitis of the right pelvic region.  Patient has had multiple courses of antibiotics in the past.  ID visit on 9/19 she had completed prolonged  courses(12 weeks) of antibiotics including linezolid  and moxifloxacin, previously Levaquin, Doxy and tedizolid.  Noted that she had refused PICC lines in the past.  Plans was for her to see Dr. Sharol Given for surgical management and bone biopsy, sent for possible 16 mecillinam cultures. Taken to the OR on 01/22/2022 as imaging showed subcapital fracture of the right femur and large lipohemarthrosis of right knee.  Patient underwent right total hip arthroplasty.  No cultures obtained.  She was then followed by orthopedics outpatient - Seen on 11/3 for postop visit with orthopedics started on linezolid and moxifloxacin.  Plan was to continue antibiotics till she can be seen by orthopedic oncologist Dr. Percell Miller at [facility] for further management for pelvic osteomyelitis. - Patient notes that she needs to see neurology for she can be  managed by orthopedics oncology. -PET/CT on 12/8 showed worsening inflammation around the right groin, focal in nature suggest ultrasound.  Progressive changes of osteomyelitis pubic bone fracture on the right.  Recommendations:  -Continue vancomycin and cefepime - I suspect pancytopenia is likely secondary to linezolid she has been on current regimen for about 1.5 months.  I will get repeat CT imaging to assess her open wound and pelvic osteomyelitis.  Depending on with CT findings are, consider ultrasound of open groin wound. She did have some bleeding from her wound today but that could be due to her low platelets. - If blood cultures remain negative and CT is stable then we will consider placing patient on suppressive antibiotics as prosthesis is in place with possible Doxy and Moxy till she is seen by ortho onc outpatient.   I have personally spent 110 minutes involved in face-to-face and non-face-to-face activities for this patient on the day of the visit. Professional time spent includes the following activities: Preparing to see the patient (review of tests), Obtaining and/or reviewing separately obtained history (admission/discharge record), Performing a medically appropriate examination and/or evaluation , Ordering medications/tests/procedures, referring and communicating with other health care professionals, Documenting clinical information in the EMR, Independently interpreting results (not separately reported), Communicating results to the patient/family/caregiver, Counseling and educating the patient/family/caregiver and Care coordination (not separately reported).   Microbiology:   Antibiotics: Vancomycin and cefepime 12/17-  Cultures: Blood 12/17 Urine 12/17    HPI: Christina Mack is a 56 y.o. 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 intake x 3 days.  On arrival to the ED patient was mildly tachycardic, tachypneic, noted to have pancytopenia on labs, hyperkalemia and admitted for severe sepsis started on empiric cefepime and vancomycin.  Labs showed pancytopenia likely related to linezolid. bleeding.  suspected 2/2 thrombocytopenia  Linezolid was stopped and ID was engaged for antibiotic recommendations. Patient is resting in bed.  She reports she has had difficulty ambulating with cane and had slightly slurred speech for the past week or so.  She reports that she has had weakness.  Reports in the last week she had a temp 100.3 at its highest.  Review of Systems: Review of Systems  All other systems reviewed and are negative.   Past Medical History:  Diagnosis Date   Complication of anesthesia    nausea and vomiting   DVT (deep venous thrombosis) (HCC)    History of osteomyelitis    Kidney transplant recipient    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([place])     Social History   Tobacco Use   Smoking status: Never   Smokeless tobacco: Never  Vaping Use   Vaping Use: Never used  Substance Use Topics   Alcohol use: Not Currently   Drug use: Never    Family History  Problem Relation Age of Onset   Cerebral aneurysm Mother    AAA (abdominal aortic aneurysm) Brother    Breast cancer Paternal Aunt    Heart attack Maternal Grandmother    Brain cancer Maternal Grandfather    Colon cancer Neg Hx    Ovarian cancer Neg Hx    Endometrial cancer Neg Hx    Pancreatic cancer Neg Hx    Prostate cancer Neg Hx    Scheduled Meds:  sodium chloride   Intravenous Once   Chlorhexidine Gluconate Cloth  6 each Topical Daily   hydrocortisone sodium succinate  100 mg  Intravenous Daily   mouth rinse  15 mL Mouth Rinse 4 times per day   pantoprazole (PROTONIX) IV  40 mg Intravenous Q12H   vancomycin variable dose per unstable renal function (pharmacist dosing)   Does not apply See admin instructions   Continuous Infusions:  ceFEPime (MAXIPIME) IV     PRN Meds:.acetaminophen, docusate sodium, lip balm, ondansetron (ZOFRAN) IV, mouth rinse, polyethylene glycol Allergies  Allergen Reactions   Oxycodone-Acetaminophen Itching, Nausea And Vomiting, Nausea Only, Rash and Other (See Comments)    Headaches, also    Piperacillin-Tazobactam In Dex     Other Reaction(s): Other (See Comments)  Thrombocytopenia  Thrombocytopenia   Zosyn [Piperacillin Sod-Tazobactam So] Other (See Comments)    Thrombocytopenia   Hydromorphone Nausea And Vomiting, Rash and Other (See Comments)   Amlodipine Other (See Comments) and Swelling    Ankle swelling   Codeine     vomiting    OBJECTIVE: Blood pressure (!) 154/93, pulse 94, temperature 98.6 F (37 C), temperature source Oral, resp. rate 20, height '5\' 5"'$  (1.651 m), weight 64.1 kg, SpO2 98 %.  Physical Exam Constitutional:      Appearance: Normal appearance.  HENT:     Head: Normocephalic and atraumatic.     Right Ear: Tympanic membrane normal.     Left Ear: Tympanic membrane normal.  Nose: Nose normal.     Mouth/Throat:     Mouth: Mucous membranes are moist.  Eyes:     Extraocular Movements: Extraocular movements intact.     Conjunctiva/sclera: Conjunctivae normal.     Pupils: Pupils are equal, round, and reactive to light.  Cardiovascular:     Rate and Rhythm: Normal rate and regular rhythm.     Heart sounds: No murmur heard.    No friction rub. No gallop.  Pulmonary:     Effort: Pulmonary effort is normal.     Breath sounds: Normal breath sounds.  Abdominal:     General: Abdomen is flat.     Palpations: Abdomen is soft.  Musculoskeletal:        General: Normal range of motion.  Skin:    General:  Skin is warm and dry.  Neurological:     General: No focal deficit present.     Mental Status: She is alert and oriented to person, place, and time.  Psychiatric:        Mood and Affect: Mood normal.     Lab Results Lab Results  Component Value Date   WBC 1.3 (LL) 03/20/2022   HGB 7.4 (L) 03/20/2022   HCT 22.6 (L) 03/20/2022   MCV 91.7 03/20/2022   PLT 23 (LL) 03/20/2022    Lab Results  Component Value Date   CREATININE 8.35 (H) 03/20/2022   BUN 89 (H) 03/20/2022   NA 138 03/20/2022   K 4.5 03/20/2022   CL 112 (H) 03/20/2022   CO2 11 (L) 03/20/2022    Lab Results  Component Value Date   ALT 9 03/19/2022   AST 22 03/19/2022   ALKPHOS 59 03/19/2022   BILITOT 0.7 03/19/2022       Laurice Record, [facility] 03/20/2022, 12:24 PM

## 2022-03-21 ENCOUNTER — Inpatient Hospital Stay (HOSPITAL_COMMUNITY)

## 2022-03-21 DIAGNOSIS — N179 Acute kidney failure, unspecified: Secondary | ICD-10-CM | POA: Diagnosis not present

## 2022-03-21 LAB — RENAL FUNCTION PANEL
Albumin: 2.7 g/dL — ABNORMAL LOW (ref 3.5–5.0)
Anion gap: 16 — ABNORMAL HIGH (ref 5–15)
BUN: 92 mg/dL — ABNORMAL HIGH (ref 6–20)
CO2: 12 mmol/L — ABNORMAL LOW (ref 22–32)
Calcium: 6.6 mg/dL — ABNORMAL LOW (ref 8.9–10.3)
Chloride: 112 mmol/L — ABNORMAL HIGH (ref 98–111)
Creatinine, Ser: 7.88 mg/dL — ABNORMAL HIGH (ref 0.44–1.00)
GFR, Estimated: 6 mL/min — ABNORMAL LOW (ref >60)
Glucose, Bld: 130 mg/dL — ABNORMAL HIGH (ref 70–99)
Phosphorus: 7.1 mg/dL — ABNORMAL HIGH (ref 2.5–4.6)
Potassium: 3.8 mmol/L (ref 3.5–5.1)
Sodium: 140 mmol/L (ref 135–145)

## 2022-03-21 LAB — CBC
HCT: 21.3 % — ABNORMAL LOW (ref 36.0–46.0)
Hemoglobin: 6.9 g/dL — CL (ref 12.0–15.0)
MCH: 29.9 pg (ref 26.0–34.0)
MCHC: 32.4 g/dL (ref 30.0–36.0)
MCV: 92.2 fL (ref 80.0–100.0)
Platelets: 27 10*3/uL — CL (ref 150–400)
RBC: 2.31 MIL/uL — ABNORMAL LOW (ref 3.87–5.11)
RDW: 17 % — ABNORMAL HIGH (ref 11.5–15.5)
WBC: 1.2 10*3/uL — CL (ref 4.0–10.5)
nRBC: 0 % (ref 0.0–0.2)

## 2022-03-21 LAB — PREPARE PLATELET PHERESIS: Unit division: 0

## 2022-03-21 LAB — BPAM PLATELET PHERESIS
Blood Product Expiration Date: 202312202359
ISSUE DATE / TIME: 202312181039
Unit Type and Rh: 7300

## 2022-03-21 LAB — HEMOGLOBIN AND HEMATOCRIT, BLOOD
HCT: 23.5 % — ABNORMAL LOW (ref 36.0–46.0)
Hemoglobin: 7.8 g/dL — ABNORMAL LOW (ref 12.0–15.0)

## 2022-03-21 LAB — PREPARE RBC (CROSSMATCH)

## 2022-03-21 LAB — URINE CULTURE: Culture: <10000 — AB

## 2022-03-21 LAB — VANCOMYCIN, RANDOM: Vancomycin Rm: 13 ug/mL

## 2022-03-21 MED ORDER — SALINE SPRAY 0.65 % NA SOLN
1.0000 | NASAL | Status: DC | PRN
  Administered 2022-03-22 (×2): 1 via NASAL
  Filled 2022-03-21: qty 44

## 2022-03-21 MED ORDER — SODIUM CHLORIDE 0.9% IV SOLUTION: Freq: Once | INTRAVENOUS | Status: DC

## 2022-03-21 MED ORDER — HYDROCODONE-ACETAMINOPHEN 5-325 MG PO TABS
1.0000 | ORAL_TABLET | Freq: Four times a day (QID) | ORAL | Status: DC | PRN
  Administered 2022-03-21 – 2022-03-25 (×3): 2 via ORAL
  Filled 2022-03-21 (×3): qty 2

## 2022-03-21 MED ORDER — DOXYCYCLINE HYCLATE 100 MG PO TABS
100.0000 mg | ORAL_TABLET | Freq: Two times a day (BID) | ORAL | Status: DC
  Administered 2022-03-21 – 2022-03-23 (×5): 100 mg via ORAL
  Filled 2022-03-21 (×5): qty 1

## 2022-03-21 MED ORDER — HYDRALAZINE HCL 20 MG/ML IJ SOLN
5.0000 mg | Freq: Four times a day (QID) | INTRAMUSCULAR | Status: DC | PRN
  Administered 2022-03-21 – 2022-03-22 (×2): 5 mg via INTRAVENOUS
  Filled 2022-03-21 (×2): qty 1

## 2022-03-21 MED ORDER — FENTANYL CITRATE PF 50 MCG/ML IJ SOSY
12.5000 ug | PREFILLED_SYRINGE | Freq: Once | INTRAMUSCULAR | Status: AC
  Administered 2022-03-21: 12.5 ug via INTRAVENOUS
  Filled 2022-03-21: qty 1

## 2022-03-21 MED ORDER — VANCOMYCIN HCL IN DEXTROSE 1-5 GM/200ML-% IV SOLN
1000.0000 mg | Freq: Once | INTRAVENOUS | Status: DC
  Filled 2022-03-21: qty 200

## 2022-03-21 NOTE — Hospital Course (Addendum)
21yof w/hx vulvar CA s/p chemo/XRT complicated by chronic R groin/inguinal open wound with known osteomyelitis, supposed to be seeing ortho onc at [facility] at some point, followed by ID here for chronic wound, has been on Linezolid for a few months presented with generalized weakness and decreased oral intake x 3 days Patient seen in the ED found to have severe hyperkalemia pancytopenia lactic acidosis FOBT positive admitted to ICU, with weakness and pancytopenia felt to be 2/2 Linezolid, changed to vancomycin seen.  She has been needing blood transfusion and platelet transfusion but no ongoing bleeding.  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 Patient stabilized transferred to [facility] 12/19. 12/19 Nursing unable to insert Foley catheter urology consulted but patient refused

## 2022-03-21 NOTE — Progress Notes (Signed)
Patient ID: Christina Mack, female   DOB: [birth year], 56 y.o.   MRN: [id number] S: No events overnight.  Pt seen in room, resting comfortably. Had some more diarrhea overnight, C diff negative.  CT of abd/pelvis with distended bladder and moderate hydronephrosis of right transplanted kidney.  O:BP (!) 175/101   Pulse (!) 104   Temp 99 F (37.2 C) (Oral)   Resp (!) 27   Ht _0  (1.651 m)   Wt 61.4 kg   SpO2 98%   BMI 22.53 kg/m   Intake/Output Summary (Last 24 hours) at 03/21/2022 1108 Last data filed at 03/21/2022 0845 Gross per 24 hour  Intake 827.11 ml  Output 500 ml  Net 327.11 ml   Intake/Output: I/O last 3 completed shifts: In: 2876.3 [I.V.:414.1; Blood:1418.1; IV Piggyback:1044.1] Out: 650 [Urine:650]  Intake/Output this shift:  Total I/O In: 384 [Blood:384] Out: -  Weight change: -1.196 kg Gen:ill-appearing, NAD QJJ:HERDE Resp: CTA Abd: +BS, soft, NT/ND Ext: no edema  Recent Labs  Lab 03/19/22 1202 03/19/22 2157 03/20/22 0304 03/21/22 0253  NA 136 139 138 140  K 6.7* 5.3* 4.5 3.8  CL 111 112* 112* 112*  CO2 <7* 9* 11* 12*  GLUCOSE 99 139* 150* 130*  BUN 93* 89* 89* 92*  CREATININE 9.30* 8.49* 8.35* 7.88*  ALBUMIN 3.4*  --   --  2.7*  CALCIUM 7.9* 7.3* 6.9* 6.6*  PHOS  --   --   --  7.1*  AST 22  --   --   --   ALT 9  --   --   --    Liver Function Tests: Recent Labs  Lab 03/19/22 1202 03/21/22 0253  AST 22  --   ALT 9  --   ALKPHOS 59  --   BILITOT 0.7  --   PROT 7.7  --   ALBUMIN 3.4* 2.7*   Recent Labs  Lab 03/19/22 1202  LIPASE 83*   No results for input(s): "AMMONIA" in the last 168 hours. CBC: Recent Labs  Lab 03/19/22 1202 03/19/22 2157 03/20/22 0304 03/20/22 0843 03/20/22 1535 03/21/22 0253  WBC 3.6* 1.5* 1.3*  --  1.0* 1.2*  NEUTROABS 2.3  --   --   --  0.6*  --   HGB 6.4* 6.3* 6.1* 7.4* 7.2* 6.9*  HCT 21.0* 19.5* 18.8* 22.6* 22.4* 21.3*  MCV 96.3 94.7 91.7  --  93.3 92.2  PLT 31* 29* 23*  --  30* 27*   Cardiac  Enzymes: No results for input(s): "CKTOTAL", "CKMB", "CKMBINDEX", "TROPONINI" in the last 168 hours. CBG: Recent Labs  Lab 03/19/22 1455  GLUCAP 82    Iron Studies: No results for input(s): "IRON", "TIBC", "TRANSFERRIN", "FERRITIN" in the last 72 hours. Studies/Results: CT ABDOMEN PELVIS WO CONTRAST  Result Date: 03/20/2022 CLINICAL DATA:  Pelvic osteomyelitis. EXAM: CT ABDOMEN AND PELVIS WITHOUT CONTRAST TECHNIQUE: Multidetector CT imaging of the abdomen and pelvis was performed following the standard protocol without IV contrast. RADIATION DOSE REDUCTION: This exam was performed according to the departmental dose-optimization program which includes automated exposure control, adjustment of the mA and/or kV according to patient size and/or use of iterative reconstruction technique. COMPARISON:  PET-CT dated 03/09/2022. CT abdomen and pelvis dated 04/03/2020. FINDINGS: Lower chest: No acute abnormality. Hepatobiliary: No focal liver abnormality is seen. Gallbladder is distended but otherwise unremarkable. No bile duct dilatation is seen. Pancreas: Unremarkable. No pancreatic ductal dilatation or surrounding inflammatory changes. Spleen: Normal in size without focal abnormality. Adrenals/Urinary  Tract: Native kidneys are atrophic. 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 Additional hydronephrosis of the somewhat atrophic-appearing transplant kidney in the LEFT pelvis. Bladder is moderately distended but otherwise unremarkable. Stomach/Bowel: No dilated large or small bowel loops are seen within the abdomen or pelvis. Stomach is unremarkable. Vascular/Lymphatic: No abdominal aortic aneurysm. Asymmetrically prominent LEFT common iliac vein, of uncertain significance. No enlarged lymph nodes are identified within the abdomen or pelvis. Reproductive: Presumed hysterectomy.  No adnexal mass or free fluid. Other: Asymmetric soft tissue thickening  involving the upper RIGHT perineum, incompletely imaged, with soft tissue wound underlying the RIGHT inferior pubic ramus. Destructive/lytic changes of the bilateral symphysis pubis and RIGHT inferior pubic ramus (discontinuity of the mid/posterior portion of the RIGHT inferior pubic ramus), corresponding to patient's known pathologic process in this area and the previously imaging reports describing a chronic open wound of the RIGHT perineum. However, there is increased destruction/discontinuity of the RIGHT inferior pubic ramus compared to the earlier PET-CT of 07/20/2021 indicating some progression. Symphysis pubis is stable. Musculoskeletal: Also, chronic pathologic-appearing fracture deformity at the lateral aspect of the RIGHT superior pubic ramus/anterior acetabulum. The adjacent RIGHT hip prosthesis appears intact and stable in position. Osseous structures of the abdomen are unremarkable. IMPRESSION: 1. Asymmetric soft tissue thickening involving the upper RIGHT perineum, incompletely imaged, with chronic open soft tissue wound underlying the RIGHT inferior pubic ramus. Associated destructive/lytic changes of the RIGHT inferior pubic ramus (discontinuity of the mid/posterior portion of the RIGHT inferior pubic ramus), corresponding to patient's known pathologic process in this area. However, there is increased destruction/discontinuity of the RIGHT inferior pubic ramus compared to the earlier PET-CT of 07/20/2021 indicating some degree of progression. 2. Chronic pathologic-appearing fracture deformity at the lateral aspect of the RIGHT superior pubic ramus/anterior acetabulum. The adjacent RIGHT hip prosthesis appears intact and stable in position. 3. 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 Additional hydronephrosis of the somewhat atrophic-appearing transplant kidney in the LEFT pelvis. Bladder is distended, suggesting associated bladder outlet  obstruction. Electronically Signed   By: Franki Cabot M.D.   On: 03/20/2022 14:50   US Renal Transplant w/Doppler  Result Date: 03/19/2022 CLINICAL DATA:  Acute on chronic renal failure EXAM: ULTRASOUND OF RENAL TRANSPLANT WITH RENAL DOPPLER ULTRASOUND TECHNIQUE: Ultrasound examination of the renal transplant was performed with gray-scale, color and duplex doppler evaluation. COMPARISON:  None Available. FINDINGS: Transplant kidney location: RLQ Transplant Kidney: Renal measurements: 11.2 x 5.3 x 6.4 cm = volume: 273m. Normal in size and parenchymal echogenicity. Moderate hydronephrosis. No peri-transplant fluid collection seen. Color flow in the main renal artery:  Yes Color flow in the main renal vein:  Yes Duplex Doppler Evaluation: Main Renal Artery Velocity: 27.7 cm/sec Main Renal Artery Resistive Index: 0.7 Venous waveform in main renal vein:  Present Intrarenal resistive index in upper pole:  0.5 (normal 0.6-0.8; equivocal 0.8-0.9; abnormal >= 0.9) Intrarenal resistive index in lower pole: 0.5 (normal 0.6-0.8; equivocal 0.8-0.9; abnormal >= 0.9) Bladder: Normal for degree of bladder distention. Other findings:  None. IMPRESSION: Right lower quadrant renal transplant with moderate hydronephrosis. Patent transplant vasculature with normal resistive indices. Electronically Signed   By: SJulian HyM.D.   On: 03/19/2022 23:07   DG Chest 2 View  Result Date: 03/19/2022 CLINICAL DATA:  Dyspnea EXAM: CHEST - 2 VIEW COMPARISON:  Chest x-ray January 19, 2022 FINDINGS: The cardiomediastinal silhouette is unchanged in contour. No  focal pulmonary opacity. No pleural effusion or pneumothorax. The visualized upper abdomen is unremarkable. No acute osseous abnormality. IMPRESSION: No acute cardiopulmonary abnormality. Electronically Signed   By: Beryle Flock M.D.   On: 03/19/2022 12:30    sodium chloride   Intravenous Once   sodium chloride   Intravenous Once   Chlorhexidine Gluconate Cloth  6 each  Topical Daily   doxycycline  100 mg Oral Q12H   hydrocortisone sodium succinate  100 mg Intravenous Daily   mouth rinse  15 mL Mouth Rinse 4 times per day   pantoprazole (PROTONIX) IV  40 mg Intravenous Q12H    BMET    Component Value Date/Time   NA 140 03/21/2022 0253   NA 139 11/26/2012 1758   K 3.8 03/21/2022 0253   K 4.6 11/26/2012 1758   CL 112 (H) 03/21/2022 0253   CL 105 11/26/2012 1758   CO2 12 (L) 03/21/2022 0253   CO2 22 11/26/2012 1758   GLUCOSE 130 (H) 03/21/2022 0253   GLUCOSE 88 11/26/2012 1758   BUN 92 (H) 03/21/2022 0253   BUN 33 (H) 11/26/2012 1758   CREATININE 7.88 (H) 03/21/2022 0253   CREATININE 2.93 (H) 06/08/2020 1544   CALCIUM 6.6 (L) 03/21/2022 0253   CALCIUM 9.7 11/26/2012 1758   GFRNONAA 6 (L) 03/21/2022 0253   GFRNONAA 19 (L) 11/26/2012 1758   GFRAA 22 (L) 11/26/2012 1758   CBC    Component Value Date/Time   WBC 1.2 (LL) 03/21/2022 0253   RBC 2.31 (L) 03/21/2022 0253   HGB 6.9 (LL) 03/21/2022 0253   HGB 10.5 (L) 11/26/2012 1758   HCT 21.3 (L) 03/21/2022 0253   HCT 31.4 (L) 11/26/2012 1758   PLT 27 (LL) 03/21/2022 0253   PLT 190 11/26/2012 1758   MCV 92.2 03/21/2022 0253   MCV 89 11/26/2012 1758   MCH 29.9 03/21/2022 0253   MCHC 32.4 03/21/2022 0253   RDW 17.0 (H) 03/21/2022 0253   RDW 13.2 11/26/2012 1758   LYMPHSABS 0.3 (L) 03/20/2022 1535   LYMPHSABS 2.4 11/26/2012 1758   MONOABS 0.1 03/20/2022 1535   MONOABS 0.7 11/26/2012 1758   EOSABS 0.0 03/20/2022 1535   EOSABS 0.1 11/26/2012 1758   BASOSABS 0.0 03/20/2022 1535   BASOSABS 0.0 11/26/2012 1758    Assessment/ Plan: AKI on CKD 5 - b/l creat 5.72- 7.10, from aug - nov 2023, eGFR 6- 8 ml/min. Creat 9.30 on admission.  AKI likely multifactorial with ischemic ATN in setting of several days of poor po intake and recurrent vomiting/ dry heaves as well as ABLA due to GI bleed. UA  with large blood and protein.  Transplant renal US with moderate hydronephrosis but normal resistive  indices.  CXR neg, no vol excess on exam. She is  now getting bicarb gtt at 100 cc/hr. Would continue. Suspect AKI due to n/v and vol depletion, vs progression to end-stage renal failure w/ uremia causing nausea/ vomiting. No neuro issues, will not proceed w/ dialysis yet. Place foley cath. IVF"s have improved BUN/Cr from admission but still with low UOP.  Her baseline function is very poor and it may be that we will need to start dialysis regardless of the treatments. I did discuss the eventual need to resume dialysis which she acknowledges, however despite a low eGFR, she had not had vascular access placed in preparation.  She was [facility] to [facility], however is high risk due to h/o ongoing osteomyelitis.   Avoid nephrotoxic medications  including NSAIDs and iodinated intravenous contrast exposure unless the latter is absolutely indicated.   Preferred narcotic agents for pain control are hydromorphone, fentanyl, and methadone. Morphine should not be used.  Avoid Baclofen and avoid oral sodium phosphate and magnesium citrate based laxatives / bowel preps.  Continue strict Input and Output monitoring. Will monitor the patient closely with you and intervene or adjust therapy as indicated by changes in clinical status/labs  Volume - looks quite dry on exam. IVF"s as above.  Acute blood loss anemia on Anemia of CKD stage 5- she was transfused 1 unit PRBC early this morning.  Had high tsat 95% last admission in Nov 2023, and ferritin high at 1352.  No need for IV Fe.  Required another transfusion this morning.  Urinary retention due to BOO - recommend placement of Foley catheter for at least a week and follow.  May need urology input.   Pancytopenia - plts 35K, WBC low. Per pmd.  Getting platelet transfusion due to GI bleed.   Renal transplant - in 2014. 2nd transplant. Cont prograf 2 bid and IV steroids.  Pancytopenia - per CCM they suspect this could be due to her prolonged zyvox exposure.   ID - some signs of sepsis like tachypnea and tachycardia. Started on IV vanc and cefepime. Blood / urine cx's ordered and pending.   Now off of vanco and on doxycycline and cefepime. 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 Had been on zyvox and moxifloxacin po. Now on doxy and cefepime. SP recent R THA and tibial plateau fracture Oct 2023  Donetta Potts, MD [facility]

## 2022-03-21 NOTE — Progress Notes (Signed)
[facility] Progress Note Patient Name: Christina Mack DOB: 1965-10-26 MRN: [id number]   Date of Service  03/21/2022  HPI/Events of Note  Pancytopenia - WBC = 1.2, Hgb = 6.9 and Platelets = 27. Pancytopenia felt to be d/t Linezolid. No evidence of active bleeding.   eICU Interventions  Plan: Transfuse 1 unit PRBC now.  Transfuse platelets for active bleeding or platelets < 10.     Intervention Category Major Interventions: Other:  Maurizio Geno Cornelia Copa 03/21/2022, 4:22 AM

## 2022-03-21 NOTE — Consult Note (Signed)
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 Bilateral native atrophic and left atrophic tranplant kidneys remain in situe. Cr 2-4 until after chemo-XRT for vulvar cancer, now baseline 6-8 and worsening. Follows at [facility] for transplant management.    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 NSG unable to place catheter. Appears to have SP window preserved above symphysis but on blood thinners. Christina Mack refuses catheter placement or suprapubic tube.   PMH sig for DVT,Eliquus, vulvar cancer s/p chemo/XRT 2021 at [facility], Chronic pelvic wounds/osteomyelitis (Eval at [facility] pending), benign hyst. Her PCP is with [facility].   Today "Christina Mack" is seen as urgent consuilt for above. Christina Mack is admitted again with sepsis from presuemed chronic osteo (UA, UCX negative) and has worsening renal failure and now incomplete bladder emptying and transplant hydro.     Past Medical History:  Diagnosis Date   Complication of anesthesia    nausea and vomiting   DVT (deep venous thrombosis) (HCC)    History of osteomyelitis    Kidney transplant recipient    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[facility])     Past Surgical History:  Procedure Laterality Date   APPLICATION OF WOUND VAC Right 01/22/2022   Procedure: APPLICATION OF WOUND VAC;  Surgeon: Leandrew Koyanagi, MD;  Location: [place];  Service: Orthopedics;  Laterality: Right;   BIOPSY  04/24/2020   Procedure: BIOPSY;  Surgeon: Doran Stabler, MD;  Location: [place];  Service: Gastroenterology;;   ESOPHAGOGASTRODUODENOSCOPY (EGD) WITH PROPOFOL N/A 04/24/2020   Procedure:  ESOPHAGOGASTRODUODENOSCOPY (EGD) WITH PROPOFOL;  Surgeon: Doran Stabler, MD;  Location: [place];  Service: Gastroenterology;  Laterality: N/A;   IR US GUIDE BX ASP/DRAIN  04/30/2020   KIDNEY TRANSPLANT  2001   KIDNEY TRANSPLANT  2014   LEEP     ORIF TIBIA PLATEAU Right 01/22/2022   Procedure: OPEN REDUCTION INTERNAL FIXATION (ORIF) TIBIAL PLATEAU;  Surgeon: Meredith Pel, MD;  Location: [place];  Service: Orthopedics;  Laterality: Right;   TOTAL ABDOMINAL HYSTERECTOMY     ovaries left in situ   TOTAL HIP ARTHROPLASTY Right 01/22/2022   Procedure: TOTAL HIP ARTHROPLASTY ANTERIOR APPROACH;  Surgeon: Leandrew Koyanagi, MD;  Location: [place];  Service: Orthopedics;  Laterality: Right;    Family History  Problem Relation Age of Onset   Cerebral aneurysm Mother    AAA (abdominal aortic aneurysm) Brother    Breast cancer Paternal Aunt    Heart attack Maternal Grandmother    Brain cancer Maternal Grandfather    Colon cancer Neg Hx    Ovarian cancer Neg Hx    Endometrial cancer Neg Hx    Pancreatic cancer Neg Hx    Prostate cancer Neg Hx     Social History:  reports that Christina Mack has never smoked. Christina Mack has never used smokeless tobacco. Christina Mack reports that Christina Mack does not currently use alcohol. Christina Mack reports that Christina Mack does not use drugs.  Allergies:  Allergies  Allergen Reactions   Oxycodone-Acetaminophen Itching, Nausea And Vomiting, Nausea Only, Rash and Other (See Comments)    Headaches, also    Piperacillin-Tazobactam In Dex     Other Reaction(s): Other (  See Comments)  Thrombocytopenia  Thrombocytopenia   Zosyn [Piperacillin Sod-Tazobactam So] Other (See Comments)    Thrombocytopenia   Hydromorphone Nausea And Vomiting, Rash and Other (See Comments)   Amlodipine Other (See Comments) and Swelling    Ankle swelling   Codeine     vomiting    Medications: I have reviewed the patient's current medications.  US PELVIS (TRANSABDOMINAL ONLY)  Result Date: 03/21/2022 CLINICAL DATA:   56 year old woman with history of vulvar carcinoma and chronic pelvic osteomyelitis. Chronic thickening of right labial soft tissues. EXAM: LIMITED ULTRASOUND OF PELVIS TECHNIQUE: Limited transabdominal ultrasound examination of the pelvis was performed. COMPARISON:  CT abdomen pelvis 03/20/2022 FINDINGS: Targeted sonographic evaluation of the area of concern in the right labial region demonstrates soft tissue thickening without discrete fluid collection. IMPRESSION: Sonographic evaluation of the area of concern in the right labia demonstrates soft tissue thickening similar to prior CT scans dating back to 04/03/2020. This may be due to chronic inflammation or postsurgical change. No discrete fluid collection identified to indicate abscess. Electronically Signed   By: Miachel Roux M.D.   On: 03/21/2022 14:08   CT ABDOMEN PELVIS WO CONTRAST  Result Date: 03/20/2022 CLINICAL DATA:  Pelvic osteomyelitis. EXAM: CT ABDOMEN AND PELVIS WITHOUT CONTRAST TECHNIQUE: Multidetector CT imaging of the abdomen and pelvis was performed following the standard protocol without IV contrast. RADIATION DOSE REDUCTION: This exam was performed according to the departmental dose-optimization program which includes automated exposure control, adjustment of the mA and/or kV according to patient size and/or use of iterative reconstruction technique. COMPARISON:  PET-CT dated 03/09/2022. CT abdomen and pelvis dated 04/03/2020. FINDINGS: Lower chest: No acute abnormality. Hepatobiliary: No focal liver abnormality is seen. Gallbladder is distended but otherwise unremarkable. No bile duct dilatation is seen. Pancreas: Unremarkable. No pancreatic ductal dilatation or surrounding inflammatory changes. Spleen: Normal in size without focal abnormality. Adrenals/Urinary Tract: Native kidneys are atrophic. 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 Additional hydronephrosis of the somewhat  atrophic-appearing transplant kidney in the LEFT pelvis. Bladder is moderately distended but otherwise unremarkable. Stomach/Bowel: No dilated large or small bowel loops are seen within the abdomen or pelvis. Stomach is unremarkable. Vascular/Lymphatic: No abdominal aortic aneurysm. Asymmetrically prominent LEFT common iliac vein, of uncertain significance. No enlarged lymph nodes are identified within the abdomen or pelvis. Reproductive: Presumed hysterectomy.  No adnexal mass or free fluid. Other: Asymmetric soft tissue thickening involving the upper RIGHT perineum, incompletely imaged, with soft tissue wound underlying the RIGHT inferior pubic ramus. Destructive/lytic changes of the bilateral symphysis pubis and RIGHT inferior pubic ramus (discontinuity of the mid/posterior portion of the RIGHT inferior pubic ramus), corresponding to patient's known pathologic process in this area and the previously imaging reports describing a chronic open wound of the RIGHT perineum. However, there is increased destruction/discontinuity of the RIGHT inferior pubic ramus compared to the earlier PET-CT of 07/20/2021 indicating some progression. Symphysis pubis is stable. Musculoskeletal: Also, chronic pathologic-appearing fracture deformity at the lateral aspect of the RIGHT superior pubic ramus/anterior acetabulum. The adjacent RIGHT hip prosthesis appears intact and stable in position. Osseous structures of the abdomen are unremarkable. IMPRESSION: 1. Asymmetric soft tissue thickening involving the upper RIGHT perineum, incompletely imaged, with chronic open soft tissue wound underlying the RIGHT inferior pubic ramus. Associated destructive/lytic changes of the RIGHT inferior pubic ramus (discontinuity of the mid/posterior portion of the RIGHT inferior pubic ramus), corresponding to patient's known pathologic process in this area. However, there is  increased destruction/discontinuity of the RIGHT inferior pubic ramus compared  to the earlier PET-CT of 07/20/2021 indicating some degree of progression. 2. Chronic pathologic-appearing fracture deformity at the lateral aspect of the RIGHT superior pubic ramus/anterior acetabulum. The adjacent RIGHT hip prosthesis appears intact and stable in position. 3. 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 Additional hydronephrosis of the somewhat atrophic-appearing transplant kidney in the LEFT pelvis. Bladder is distended, suggesting associated bladder outlet obstruction. Electronically Signed   By: Franki Cabot M.D.   On: 03/20/2022 14:50   US Renal Transplant w/Doppler  Result Date: 03/19/2022 CLINICAL DATA:  Acute on chronic renal failure EXAM: ULTRASOUND OF RENAL TRANSPLANT WITH RENAL DOPPLER ULTRASOUND TECHNIQUE: Ultrasound examination of the renal transplant was performed with gray-scale, color and duplex doppler evaluation. COMPARISON:  None Available. FINDINGS: Transplant kidney location: RLQ Transplant Kidney: Renal measurements: 11.2 x 5.3 x 6.4 cm = volume: 251m. Normal in size and parenchymal echogenicity. Moderate hydronephrosis. No peri-transplant fluid collection seen. Color flow in the main renal artery:  Yes Color flow in the main renal vein:  Yes Duplex Doppler Evaluation: Main Renal Artery Velocity: 27.7 cm/sec Main Renal Artery Resistive Index: 0.7 Venous waveform in main renal vein:  Present Intrarenal resistive index in upper pole:  0.5 (normal 0.6-0.8; equivocal 0.8-0.9; abnormal >= 0.9) Intrarenal resistive index in lower pole: 0.5 (normal 0.6-0.8; equivocal 0.8-0.9; abnormal >= 0.9) Bladder: Normal for degree of bladder distention. Other findings:  None. IMPRESSION: Right lower quadrant renal transplant with moderate hydronephrosis. Patent transplant vasculature with normal resistive indices. Electronically Signed   By: SJulian HyM.D.   On: 03/19/2022 23:07    Review of Systems  Constitutional:  Positive  for chills and fatigue.  Genitourinary:  Positive for dysuria.  All other systems reviewed and are negative.  Blood pressure (!) 174/103, pulse 71, temperature 98.9 F (37.2 C), temperature source Axillary, resp. rate 20, height '5\' 5"'$  (1.651 m), weight 61.4 kg, SpO2 100 %. Physical Exam Vitals reviewed.  Constitutional:      Comments: Pleasant, AOx3 in ICU. NSG and tech at bedside.   Eyes:     Pupils: Pupils are equal, round, and reactive to light.  Pulmonary:     Effort: Pulmonary effort is normal.  Abdominal:     Comments: Stigmata of prior weight loss. BIlateral lower gibson type scars.   Genitourinary:    Comments: Significant labial edema c/w prior radiation.  Musculoskeletal:     Cervical back: Normal range of motion.  Neurological:     General: No focal deficit present.     Mental Status: Christina Mack is alert.  Psychiatric:        Mood and Affect: Mood normal.     Assessment/Plan:  I explained to Christina Mack I am concerned that chronic poor emptying (from likely radiation induced stricture) is accelerating demise of her most renal transplant and that very high chance heading back toward dialysis. I recommended catheter placement with urethral dilation to temporize and then path to SP Tube (after holding eliquus) which would likely be more comfortable in long term. Christina Mack refuses all this. Christina Mack verbalized understanding that Christina Mack may wind up on dialysis again. Christina Mack states Christina Mack has received this advice before.   Happy to re-evaluate in non-urgent setting if Christina Mack changes her mind.    TAlexis Frock12/19/2023, 5:44 PM

## 2022-03-21 NOTE — Progress Notes (Signed)
Pharmacy Antibiotic Note  Christina Mack is a 56 y.o. female admitted on 03/19/2022 with sepsis, hx of chronic pelvic osteomyelitis.  Prior to admission Linezolid and moxifloxacin have been held d/t pancytopenia.  She has AKI with SCr 9 (baseline ~6, hx renal transplant).  Pharmacy has been consulted for Vancomycin and Cefepime dosing.  Today, 03/21/22 02:53 Vancomycin random level 13 Level obtained 35.5 hours after previous dose Re-dose vancomycin when random < 20  Plan: Continue Cefepime 1g IV q24h Repeat Vancomycin 1g IV x 1 today.  Further doses will be ordered based on levels/renal function.   Measure Vanc random level on 12/21 with AM labs. Follow up renal function, culture results, and clinical course.   Height: '5\' 5"'$  (165.1 cm) Weight: 61.4 kg (135 lb 5.8 oz) IBW/kg (Calculated) : 57  Temp (24hrs), Avg:98.4 F (36.9 C), Min:97.5 F (36.4 C), Max:99.6 F (37.6 C)  Recent Labs  Lab 03/19/22 1202 03/19/22 1400 03/19/22 1524 03/19/22 2157 03/20/22 0304 03/20/22 1535 03/21/22 0253  WBC 3.6*  --   --  1.5* 1.3* 1.0* 1.2*  CREATININE 9.30*  --   --  8.49* 8.35*  --  7.88*  LATICACIDVEN  --  3.9* 3.3*  --   --   --   --   VANCORANDOM  --   --   --   --   --   --  13     Estimated Creatinine Clearance: 7.3 mL/min (A) (by C-G formula based on SCr of 7.88 mg/dL (H)).    Allergies  Allergen Reactions   Oxycodone-Acetaminophen Itching, Nausea And Vomiting, Nausea Only, Rash and Other (See Comments)    Headaches, also    Piperacillin-Tazobactam In Dex     Other Reaction(s): Other (See Comments)  Thrombocytopenia  Thrombocytopenia   Zosyn [Piperacillin Sod-Tazobactam So] Other (See Comments)    Thrombocytopenia   Hydromorphone Nausea And Vomiting, Rash and Other (See Comments)   Amlodipine Other (See Comments) and Swelling    Ankle swelling   Codeine     vomiting    Antimicrobials this admission: PTA linezolid/moxifloxacin >> 12/15 12/17 Cefepime >> 12/17  Vancomycin >>   Dose adjustments this admission: 12/19 0500 Vanc rand: 13  Microbiology results: 12/17 BCx: 1/4 PsA 12/17 UCx:  sent 12/17 Resp: Covid neg; Influenza neg; RSV neg 12/17 MRSA PCR: not detected  Thank you for allowing pharmacy to be a part of this patient's care.  Royetta Asal, PharmD, BCPS Clinical Pharmacist  Please utilize Amion for appropriate phone number to reach the unit pharmacist ([place]) 03/21/2022 8:01 AM

## 2022-03-21 NOTE — Progress Notes (Addendum)
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  She has been needing blood transfusion and platelet transfusion but no ongoing bleeding.  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 Patient stabilized transferred to [facility] 12/19.     Subjective: SEEN examined this morning complains of pain blood pressure on higher side Overnight received fentanyl x 2 starting Norco this morning Labs reviewed creatinine slightly better at 7.8 acidotic bicarb coming up slowly at 12 BUN 92, continues to be pancytopenic> completing 1 unit blood transfusion this morning   Assessment and Plan: Principal Problem:   AKI (acute kidney injury) ([place]) Active Problems:   Pancytopenia ([place])   Pressure injury of skin   Pancytopenia: Felt to be due to linezolid.  ID following holding linezolid for now continue vancomycin  Concern for severe sepsis with Pseudomonas positive in blood culture  Pelvic Osteomyelitis and open groin wound: ID input appreciated.Continue vancomycin and cefepime monitor CBC.  Repeat CT imaging per ID 12/18: Soft tissue thickening of upper right perineum with chronic soft tissue wound underlying right respiratory.  Grammas with destructive changes in the right inferior pubic ramus, chronic pathologic appearing fracture in the lateral aspect of right superior ramus.  Blood culture to 12/17 positive with Pseudomonas C/S pending -will need suppressive antibiotic as prosthesis is in place.  Patient complains of pain at the infection site resume Norco which she takes at home. Recent Labs  Lab 03/19/22 1202 03/19/22 1400 03/19/22 1524 03/19/22 2157 03/20/22 0304 03/20/22 1535 03/21/22 0253  WBC 3.6*  --   --  1.5* 1.3* 1.0* 1.2*  LATICACIDVEN  --  3.9* 3.3*  --   --   --   --     Diarrhea negative for C. difficile antigen.  Continue symptomatic management  AKI on CKD 5 with metabolic acidosis- b/l creat 5.7-7.1 Renal transplant status x2 Hyperkalemia resolved: Nephrology on board.  Suspected ischemic ATN also multifactorial in the setting of decreased oral intake recurrent vomiting acute blood loss anemia.Avoid obstruction medication including NSAIDs contrast, monitor intake output Daily weight and renal function per nephrology.  No evidence of volume overload.  With baseline poor functional pending dialysis, no vascular access yet has been referred to [facility] for a third transplant but is high risk due to history of ongoing osteomyelitis.  Immunosuppression on hold hopefully can resume in next 1 to 2 days.  Continue steroid iv. Recent Labs  Lab 03/19/22 1202 03/19/22 2157 03/20/22 0304 03/21/22 0253  BUN 93* 89* 89* 92*  CREATININE 9.30* 8.49* 8.35* 7.88*  Urine retention seen in CT- nephro advised foley-RN unable to insert one- paged Dr Tresa Moore- able to get hold of him this evening and he will be seeing her today.   Acute blood loss anemia Anemia of chronic renal disease: High iron saturation and ferritin high no need for IV iron.  ESA per nephrology.  Needing multiple PRBC transfusion.  Monitor and transfuse for <  7gm.  Reports brownish stool yesterday no bleeding. Recent Labs  Lab 03/19/22 2157 03/20/22 0304 03/20/22 0843 03/20/22 1535 03/21/22 0253  HGB 6.3* 6.1* 7.4* 7.2* 6.9*  HCT 19.5* 18.8* 22.6* 22.4* 21.3*    ?Upper GI bleeding no  evidence of bleeding at this point continue PPI, holding Eliquis.  Reports having brown stool yesterday.  If any rectal bleeding/melena> low threshold for GI evaluation. History of DVT 2021.  Recent duplex negative Eliquis on hold. Hemocculst was +. GI consulted. Pressure ulcer in mid sacrum POA continue wound care. Pressure Injury 03/19/22 Sacrum Mid Stage 2 -  Partial thickness loss of dermis presenting as a shallow open injury with a red, pink wound bed without slough. (Active)  03/19/22 1853  Location: Sacrum  Location Orientation: Mid  Staging: Stage 2 -  Partial thickness loss of dermis presenting as a shallow open injury with a red, pink wound bed without slough.  Wound Description (Comments):   Present on Admission: Yes   Recent right THA and tibial plateau fracture October 2023 History of valvular carcinoma 2021- treated with chemoradiation GOC: Full code,  DVT prophylaxis: SCDs Start: 03/19/22 1636 Code Status:   Code Status: Full Code Family Communication: plan of care discussed with patient at bedside. Patient status is: Inpatient because of severe sepsis, renal failure Level of care: ICU  Dispo: The patient is from: HOME            Anticipated disposition: TBD Objective: Vitals last 24 hrs: Vitals:   03/21/22 0623 03/21/22 0800 03/21/22 0845 03/21/22 0900  BP: (!) 144/93 (!) 152/97 (!) 171/97 (!) 178/97  Pulse: 88 (!) 104 94 96  Resp: (!) 23 (!) 31 (!) 39 (!) 23  Temp: 99.6 F (37.6 C) 98 F (36.7 C) 99 F (37.2 C)   TempSrc: Oral Oral Oral   SpO2: 99% 93% 100% 99%  Weight:      Height:       Weight change: -1.196 kg  Physical Examination: General exam: alert awake, appears older for age, any pain, on room air HEENT:Oral mucosa moist, Ear/Nose WNL grossly Respiratory system: bilaterally CLEAR BS, no use of accessory muscle Cardiovascular system: S1 & S2 +, No JVD. Gastrointestinal system: Abdomen soft,NT,ND, BS+ Nervous System:Alert, awake, moving  extremities. Extremities: LE edema NEG,distal peripheral pulses palpable.  Skin: No rashes,no icterus. MSK: Normal muscle bulk,tone, power  Medications reviewed:  Scheduled Meds:  sodium chloride   Intravenous Once   sodium chloride   Intravenous Once   Chlorhexidine Gluconate Cloth  6 each Topical Daily   doxycycline  100 mg Oral Q12H   hydrocortisone sodium succinate  100 mg Intravenous Daily   mouth rinse  15 mL Mouth Rinse 4 times per day   pantoprazole (PROTONIX) IV  40 mg Intravenous Q12H   Continuous Infusions:  ceFEPime (MAXIPIME) IV Stopped (03/20/22 1407)   Diet Order             Diet renal with fluid restriction Fluid restriction: 2000 mL Fluid; Room service appropriate? Yes; Fluid consistency: Thin  Diet effective now                  Intake/Output Summary (Last 24 hours) at 03/21/2022 1018 Last data filed at 03/21/2022 0845 Gross per 24 hour  Intake 827.11 ml  Output 550 ml  Net 277.11 ml   Net IO Since Admission: 3,225.31 mL [03/21/22 1018]  Wt Readings from Last 3 Encounters:  03/21/22 61.4 kg  03/09/22 63.5 kg  01/22/22 69 kg     Unresulted Labs (From admission, onward)     Start     Ordered   03/21/22 0500  Renal function panel  Daily,   R     Question:  Specimen collection method  Answer:  Lab=Lab collect   03/20/22 1153   03/19/22 1528  Urine Culture  Once,   URGENT       Question:  Indication  Answer:  Sepsis   03/19/22 1527          Data Reviewed: I have personally reviewed following labs and imaging studies CBC: Recent Labs  Lab 03/19/22 1202 03/19/22 2157 03/20/22 0304 03/20/22 0843 03/20/22 1535 03/21/22 0253  WBC 3.6* 1.5* 1.3*  --  1.0* 1.2*  NEUTROABS 2.3  --   --   --  0.6*  --   HGB 6.4* 6.3* 6.1* 7.4* 7.2* 6.9*  HCT 21.0* 19.5* 18.8* 22.6* 22.4* 21.3*  MCV 96.3 94.7 91.7  --  93.3 92.2  PLT 31* 29* 23*  --  30* 27*   Basic Metabolic Panel: Recent Labs  Lab 03/19/22 1202 03/19/22 2157 03/20/22 0304  03/21/22 0253  NA 136 139 138 140  K 6.7* 5.3* 4.5 3.8  CL 111 112* 112* 112*  CO2 <7* 9* 11* 12*  GLUCOSE 99 139* 150* 130*  BUN 93* 89* 89* 92*  CREATININE 9.30* 8.49* 8.35* 7.88*  CALCIUM 7.9* 7.3* 6.9* 6.6*  PHOS  --   --   --  7.1*   GFR: Estimated Creatinine Clearance: 7.3 mL/min (A) (by C-G formula based on SCr of 7.88 mg/dL (H)). Liver Function Tests: Recent Labs  Lab 03/19/22 1202 03/21/22 0253  AST 22  --   ALT 9  --   ALKPHOS 59  --   BILITOT 0.7  --   PROT 7.7  --   ALBUMIN 3.4* 2.7*   Recent Labs  Lab 03/19/22 1202  LIPASE 83*   Recent Labs  Lab 03/19/22 1400 03/19/22 1524  LATICACIDVEN 3.9* 3.3*    Recent Results (from the past 240 hour(s))  Resp panel by RT-PCR (RSV, Flu A&B, Covid) Anterior Nasal Swab     Status: None   Collection Time: 03/19/22 12:02 PM   Specimen: Anterior Nasal Swab  Result Value Ref Range Status   SARS Coronavirus 2 by RT PCR NEGATIVE NEGATIVE Final    Comment: (NOTE) SARS-CoV-2 target nucleic acids are NOT DETECTED.  The SARS-CoV-2 RNA is generally detectable in upper respiratory specimens during the acute phase of infection. The lowest concentration of SARS-CoV-2 viral copies this assay can detect is 138 copies/mL. A negative result does not preclude SARS-Cov-2 infection and should not be used as the sole basis for treatment or other patient management decisions. A negative result may occur with  improper specimen collection/handling, submission of specimen other than nasopharyngeal swab, presence of viral mutation(s) within the areas targeted by this assay, and inadequate number of viral copies(<138 copies/mL). A negative result must be combined with clinical observations, patient history, and epidemiological information. The expected result is Negative.  Fact Sheet for Patients:  EntrepreneurPulse.com.au  Fact Sheet for Healthcare Providers:  IncredibleEmployment.be  This test  is no t yet approved or cleared by the [place] FDA and  has been authorized for detection and/or diagnosis of SARS-CoV-2 by FDA under an Emergency Use Authorization (EUA). This EUA will remain  in effect (meaning this test can be used) for the duration of the COVID-19 declaration under Section 564(b)(1)  of the Act, 21 U.S.C.section 360bbb-3(b)(1), unless the authorization is terminated  or revoked sooner.       Influenza A by PCR NEGATIVE NEGATIVE Final   Influenza B by PCR NEGATIVE NEGATIVE Final    Comment: (NOTE) The Xpert Xpress SARS-CoV-2/FLU/RSV plus assay is intended as an aid in the diagnosis of influenza from Nasopharyngeal swab specimens and should not be used as a sole basis for treatment. Nasal washings and aspirates are unacceptable for Xpert Xpress SARS-CoV-2/FLU/RSV testing.  Fact Sheet for Patients: EntrepreneurPulse.com.au  Fact Sheet for Healthcare Providers: IncredibleEmployment.be  This test is not yet approved or cleared by the [place] FDA and has been authorized for detection and/or diagnosis of SARS-CoV-2 by FDA under an Emergency Use Authorization (EUA). This EUA will remain in effect (meaning this test can be used) for the duration of the COVID-19 declaration under Section 564(b)(1) of the Act, 21 U.S.C. section 360bbb-3(b)(1), unless the authorization is terminated or revoked.     Resp Syncytial Virus by PCR NEGATIVE NEGATIVE Final    Comment: (NOTE) Fact Sheet for Patients: EntrepreneurPulse.com.au  Fact Sheet for Healthcare Providers: IncredibleEmployment.be  This test is not yet approved or cleared by the [place] FDA and has been authorized for detection and/or diagnosis of SARS-CoV-2 by FDA under an Emergency Use Authorization (EUA). This EUA will remain in effect (meaning this test can be used) for the duration of the COVID-19 declaration under Section  564(b)(1) of the Act, 21 U.S.C. section 360bbb-3(b)(1), unless the authorization is terminated or revoked.  Performed at [facility], [place] [address] [place], [place] [address]   Culture, blood (routine x 2)     Status: None (Preliminary result)   Collection Time: 03/19/22  1:29 PM   Specimen: BLOOD  Result Value Ref Range Status   Specimen Description   Final    BLOOD LEFT ANTECUBITAL Performed at [place] [address], [place], [place] [address]    Special Requests   Final    BOTTLES DRAWN AEROBIC AND ANAEROBIC Blood Culture adequate volume Performed at [place] [address], [place], [place] [address]    Culture   Final    NO GROWTH 2 DAYS Performed at [place] [address] [place], [place] [address]    Report Status PENDING  Incomplete  Culture, blood (routine x 2)     Status: Abnormal (Preliminary result)   Collection Time: 03/19/22  2:00 PM   Specimen: BLOOD  Result Value Ref Range Status   Specimen Description   Final    BLOOD RIGHT ANTECUBITAL Performed at [place] [address] [place], [place] [address]    Special Requests   Final    BOTTLES DRAWN AEROBIC AND ANAEROBIC Blood Culture results may not be optimal due to an excessive volume of blood received in culture bottles Performed at [place] [address] [place], [place] [address]    Culture  Setup Time   Final    GRAM NEGATIVE RODS ANAEROBIC BOTTLE ONLY CRITICAL RESULT CALLED TO, READ BACK BY AND VERIFIED WITH:  C/ PHARMD M. BELL 03/20/22 1342 A. LAFRANCE    Culture (A)  Final    PSEUDOMONAS AERUGINOSA SUSCEPTIBILITIES TO FOLLOW Performed at [facility], [place] [address] [place], [place] [address]    Report Status PENDING  Incomplete  Blood Culture ID Panel (Reflexed)     Status: Abnormal   Collection Time: 03/19/22  2:00 PM  Result Value Ref Range Status    Enterococcus faecalis NOT DETECTED NOT DETECTED Final   Enterococcus Faecium NOT DETECTED NOT DETECTED Final   Listeria monocytogenes NOT DETECTED NOT DETECTED Final   Staphylococcus species NOT DETECTED NOT DETECTED Final   Staphylococcus aureus (BCID) NOT DETECTED NOT DETECTED Final   Staphylococcus epidermidis NOT DETECTED NOT DETECTED Final   Staphylococcus lugdunensis NOT DETECTED NOT DETECTED Final   Streptococcus species NOT DETECTED NOT DETECTED Final   Streptococcus agalactiae NOT DETECTED NOT DETECTED Final   Streptococcus pneumoniae NOT DETECTED NOT DETECTED Final   Streptococcus pyogenes NOT DETECTED NOT DETECTED Final   A.calcoaceticus-baumannii NOT DETECTED NOT DETECTED Final   Bacteroides fragilis NOT DETECTED NOT DETECTED Final   Enterobacterales NOT DETECTED NOT DETECTED Final   Enterobacter cloacae complex NOT DETECTED NOT DETECTED Final   Escherichia coli NOT DETECTED NOT DETECTED Final   Klebsiella aerogenes NOT DETECTED NOT DETECTED Final   Klebsiella oxytoca NOT DETECTED NOT DETECTED Final   Klebsiella pneumoniae NOT DETECTED NOT DETECTED Final   Proteus species NOT DETECTED NOT DETECTED Final   Salmonella species NOT DETECTED NOT DETECTED Final   Serratia marcescens NOT DETECTED NOT DETECTED Final   Haemophilus influenzae NOT DETECTED NOT DETECTED Final   Neisseria meningitidis NOT DETECTED NOT DETECTED Final   Pseudomonas aeruginosa DETECTED (A) NOT DETECTED Final    Comment: CRITICAL RESULT CALLED TO, READ BACK BY AND VERIFIED WITH:  C/ PHARMD M. BELL 03/20/22 1342 A. LAFRANCE    Stenotrophomonas maltophilia NOT DETECTED NOT DETECTED Final   Candida albicans NOT DETECTED NOT DETECTED Final   Candida auris NOT DETECTED NOT DETECTED Final   Candida glabrata NOT DETECTED NOT DETECTED Final   Candida krusei NOT DETECTED NOT DETECTED Final   Candida parapsilosis NOT DETECTED NOT DETECTED Final   Candida tropicalis NOT DETECTED NOT DETECTED Final   Cryptococcus  neoformans/gattii NOT DETECTED NOT DETECTED Final   CTX-M ESBL NOT DETECTED NOT DETECTED Final   Carbapenem resistance IMP NOT DETECTED NOT DETECTED Final   Carbapenem resistance KPC NOT DETECTED NOT DETECTED Final   Carbapenem resistance NDM NOT DETECTED NOT DETECTED Final   Carbapenem resistance VIM NOT DETECTED NOT DETECTED Final    Comment: Performed at [facility], [address] [place], [place] [address]  MRSA Next Gen by PCR, Nasal     Status: None   Collection Time: 03/19/22  6:58 PM   Specimen: Nasal Mucosa; Nasal Swab  Result Value Ref Range Status   MRSA by PCR Next Gen NOT DETECTED NOT DETECTED Final    Comment: (NOTE) The GeneXpert MRSA Assay (FDA approved for NASAL specimens only), is one component of a comprehensive MRSA colonization surveillance program. It is not intended to diagnose MRSA infection nor to guide or monitor treatment for MRSA infections. Test performance is not FDA approved in patients less than 62 years old. Performed at [facility], [place] [address], [place], [place] [address]   C Difficile Quick Screen w PCR reflex     Status: None   Collection Time: 03/20/22  6:46 PM   Specimen: STOOL  Result Value Ref Range Status   C Diff antigen NEGATIVE NEGATIVE Final   C Diff toxin NEGATIVE NEGATIVE Final   C Diff interpretation No C. difficile detected.  Final    Comment: Performed at [facility], [place] [address], [place], [place] [address]    Antimicrobials: Anti-infectives (From admission, onward)    Start  Dose/Rate Route Frequency Ordered Stop   03/21/22 1000  doxycycline (VIBRA-TABS) tablet 100 mg        100 mg Oral Every 12 hours 03/21/22 0828     03/21/22 0845  vancomycin (VANCOCIN) IVPB 1000 mg/200 mL premix  Status:  Discontinued        1,000 mg 200 mL/hr over 60 Minutes Intravenous  Once 03/21/22 0755 03/21/22 0828   03/20/22 1400  ceFEPIme (MAXIPIME) 1 g in sodium chloride 0.9 % 100 mL IVPB         1 g 200 mL/hr over 30 Minutes Intravenous Every 24 hours 03/19/22 1719     03/19/22 1719  vancomycin variable dose per unstable renal function (pharmacist dosing)  Status:  Discontinued         Does not apply See admin instructions 03/19/22 1719 03/21/22 0828   03/19/22 1400  vancomycin (VANCOCIN) IVPB 1000 mg/200 mL premix        1,000 mg 200 mL/hr over 60 Minutes Intravenous  Once 03/19/22 1348 03/19/22 1628   03/19/22 1400  ceFEPIme (MAXIPIME) 2 g in sodium chloride 0.9 % 100 mL IVPB        2 g 200 mL/hr over 30 Minutes Intravenous  Once 03/19/22 1348 03/19/22 1511      Culture/Microbiology    Component Value Date/Time   SDES  03/19/2022 1400    BLOOD RIGHT ANTECUBITAL Performed at [facility], [place] [address], [place], [place] [address]    SPECREQUEST  03/19/2022 1400    BOTTLES DRAWN AEROBIC AND ANAEROBIC Blood Culture results may not be optimal due to an excessive volume of blood received in culture bottles Performed at [facility], [place] [address] [place], [place] [address]    CULT (A) 03/19/2022 1400    PSEUDOMONAS AERUGINOSA SUSCEPTIBILITIES TO FOLLOW Performed at [facility], [place] [address], [place], [place] [address]    REPTSTATUS PENDING 03/19/2022 1400  Radiology Studies: CT ABDOMEN PELVIS WO CONTRAST  Result Date: 03/20/2022 CLINICAL DATA:  Pelvic osteomyelitis. EXAM: CT ABDOMEN AND PELVIS WITHOUT CONTRAST TECHNIQUE: Multidetector CT imaging of the abdomen and pelvis was performed following the standard protocol without IV contrast. RADIATION DOSE REDUCTION: This exam was performed according to the departmental dose-optimization program which includes automated exposure control, adjustment of the mA and/or kV according to patient size and/or use of iterative reconstruction technique. COMPARISON:  PET-CT dated 03/09/2022. CT abdomen and pelvis dated 04/03/2020. FINDINGS: Lower chest: No acute abnormality. Hepatobiliary:  No focal liver abnormality is seen. Gallbladder is distended but otherwise unremarkable. No bile duct dilatation is seen. Pancreas: Unremarkable. No pancreatic ductal dilatation or surrounding inflammatory changes. Spleen: Normal in size without focal abnormality. Adrenals/Urinary Tract: Native kidneys are atrophic. 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 Additional hydronephrosis of the somewhat atrophic-appearing transplant kidney in the LEFT pelvis. Bladder is moderately distended but otherwise unremarkable. Stomach/Bowel: No dilated large or small bowel loops are seen within the abdomen or pelvis. Stomach is unremarkable. Vascular/Lymphatic: No abdominal aortic aneurysm. Asymmetrically prominent LEFT common iliac vein, of uncertain significance. No enlarged lymph nodes are identified within the abdomen or pelvis. Reproductive: Presumed hysterectomy.  No adnexal mass or free fluid. Other: Asymmetric soft tissue thickening involving the upper RIGHT perineum, incompletely imaged, with soft tissue wound underlying the RIGHT inferior pubic ramus. Destructive/lytic changes of the bilateral symphysis pubis and RIGHT inferior pubic ramus (discontinuity of the mid/posterior portion of the RIGHT inferior  pubic ramus), corresponding to patient's known pathologic process in this area and the previously imaging reports describing a chronic open wound of the RIGHT perineum. However, there is increased destruction/discontinuity of the RIGHT inferior pubic ramus compared to the earlier PET-CT of 07/20/2021 indicating some progression. Symphysis pubis is stable. Musculoskeletal: Also, chronic pathologic-appearing fracture deformity at the lateral aspect of the RIGHT superior pubic ramus/anterior acetabulum. The adjacent RIGHT hip prosthesis appears intact and stable in position. Osseous structures of the abdomen are unremarkable. IMPRESSION: 1. Asymmetric soft tissue  thickening involving the upper RIGHT perineum, incompletely imaged, with chronic open soft tissue wound underlying the RIGHT inferior pubic ramus. Associated destructive/lytic changes of the RIGHT inferior pubic ramus (discontinuity of the mid/posterior portion of the RIGHT inferior pubic ramus), corresponding to patient's known pathologic process in this area. However, there is increased destruction/discontinuity of the RIGHT inferior pubic ramus compared to the earlier PET-CT of 07/20/2021 indicating some degree of progression. 2. Chronic pathologic-appearing fracture deformity at the lateral aspect of the RIGHT superior pubic ramus/anterior acetabulum. The adjacent RIGHT hip prosthesis appears intact and stable in position. 3. 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 Additional hydronephrosis of the somewhat atrophic-appearing transplant kidney in the LEFT pelvis. Bladder is distended, suggesting associated bladder outlet obstruction. Electronically Signed   By: Franki Cabot M.D.   On: 03/20/2022 14:50   US Renal Transplant w/Doppler  Result Date: 03/19/2022 CLINICAL DATA:  Acute on chronic renal failure EXAM: ULTRASOUND OF RENAL TRANSPLANT WITH RENAL DOPPLER ULTRASOUND TECHNIQUE: Ultrasound examination of the renal transplant was performed with gray-scale, color and duplex doppler evaluation. COMPARISON:  None Available. FINDINGS: Transplant kidney location: RLQ Transplant Kidney: Renal measurements: 11.2 x 5.3 x 6.4 cm = volume: 29m. Normal in size and parenchymal echogenicity. Moderate hydronephrosis. No peri-transplant fluid collection seen. Color flow in the main renal artery:  Yes Color flow in the main renal vein:  Yes Duplex Doppler Evaluation: Main Renal Artery Velocity: 27.7 cm/sec Main Renal Artery Resistive Index: 0.7 Venous waveform in main renal vein:  Present Intrarenal resistive index in upper pole:  0.5 (normal 0.6-0.8; equivocal  0.8-0.9; abnormal >= 0.9) Intrarenal resistive index in lower pole: 0.5 (normal 0.6-0.8; equivocal 0.8-0.9; abnormal >= 0.9) Bladder: Normal for degree of bladder distention. Other findings:  None. IMPRESSION: Right lower quadrant renal transplant with moderate hydronephrosis. Patent transplant vasculature with normal resistive indices. Electronically Signed   By: SJulian HyM.D.   On: 03/19/2022 23:07   DG Chest 2 View  Result Date: 03/19/2022 CLINICAL DATA:  Dyspnea EXAM: CHEST - 2 VIEW COMPARISON:  Chest x-ray January 19, 2022 FINDINGS: The cardiomediastinal silhouette is unchanged in contour. No focal pulmonary opacity. No pleural effusion or pneumothorax. The visualized upper abdomen is unremarkable. No acute osseous abnormality. IMPRESSION: No acute cardiopulmonary abnormality. Electronically Signed   By: MBeryle FlockM.D.   On: 03/19/2022 12:30     LOS: 2 days   RAntonieta Pert MD [facility]  03/21/2022, 10:18 AM

## 2022-03-21 NOTE — Progress Notes (Signed)
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  Maintained on linezolid and moxifloxacin admitted for severe sepsis and pancytopenia.  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  Patient has had multiple courses of antibiotics in the past.  ID visit on 9/19 she had completed prolonged  courses(12 weeks) of antibiotics including linezolid  and moxifloxacin, previously Levaquin, Doxy and tedizolid.  Noted that she had refused PICC lines in the past.  Plans was for her to see Dr. Sharol Given for surgical management and bone biopsy, sent for possible 16 mecillinam cultures. -Taken to the OR on 01/22/2022 as imaging showed subcapital fracture of the right femur and large lipohemarthrosis of right knee.  Patient underwent right total hip arthroplasty.  No cultures obtained.  She was then followed by orthopedics outpatient - Seen on 11/3 for postop visit with orthopedics started on linezolid and moxifloxacin.  Plan was to continue antibiotics till she can be seen by orthopedic oncologist Dr. Percell Miller at [facility] for further management for pelvic osteomyelitis. - Patient notes that she needs to see neurology for she can be managed by orthopedics oncology. -PET/CT on 12/8 showed worsening inflammation around the right groin, focal in nature suggest ultrasound.   Progressive changes of osteomyelitis pubic bone fracture on the right. -CT showed increased x-ray from/discontinuity of right anterior pelvic wall mass comparator earlier CT on 07/20/2021.  Chronic open soft tissue wound underlying right inferior pubic ramus with asymmetric soft tissue thickening incompletely imaged.  Right hip prosthesis is intact and stable position.  Hydronephrosis of the transplant kidney in the right lower pelvis.  Hydronephrosis of left kidney.  Bladder distended with possible bladder outlet obstruction. Recommendations:  -D/C vancomycin -Start doxycycline(suspect will need a suppressive regimen as HWR in place) -Continue cefepime -Pelvic U/S to evaluate groin wound -Will plan to reach out to Radiology per prosthesis proximity to the osteo.  #Hx of renal transplant x2 #Bladder outlet obstruction Microbiology:   Antibiotics: Vnaomcycin and cefepime 12/17- Cultures: Blood 12/17 1/2 PSA   SUBJECTIVE: Reports her legs are in pain Interval: Afebrile overnight  Review of Systems: Review of Systems  All other systems reviewed and are negative.    Scheduled Meds:  sodium chloride   Intravenous Once   sodium chloride   Intravenous Once   Chlorhexidine Gluconate Cloth  6 each Topical Daily   hydrocortisone sodium succinate  100 mg Intravenous Daily   mouth rinse  15 mL Mouth Rinse 4 times per day   pantoprazole (PROTONIX) IV  40 mg Intravenous Q12H   vancomycin variable dose per unstable renal function (pharmacist dosing)   Does not apply See admin instructions   Continuous Infusions:  ceFEPime (MAXIPIME) IV Stopped (03/20/22 1407)   PRN Meds:.acetaminophen, docusate sodium, lip balm, ondansetron (ZOFRAN) IV, mouth rinse, polyethylene glycol Allergies  Allergen Reactions   Oxycodone-Acetaminophen Itching, Nausea And Vomiting, Nausea  Only, Rash and Other (See Comments)    Headaches, also    Piperacillin-Tazobactam In Dex     Other Reaction(s): Other (See  Comments)  Thrombocytopenia  Thrombocytopenia   Zosyn [Piperacillin Sod-Tazobactam So] Other (See Comments)    Thrombocytopenia   Hydromorphone Nausea And Vomiting, Rash and Other (See Comments)   Amlodipine Other (See Comments) and Swelling    Ankle swelling   Codeine     vomiting    OBJECTIVE: Vitals:   03/21/22 0524 03/21/22 0533 03/21/22 0545 03/21/22 0548  BP:  (!) 139/90 (!) 141/82 (!) 146/91  Pulse:  92 84 82  Resp:  (!) 27 (!) 25 (!) 25  Temp: 98.7 F (37.1 C) 98.8 F (37.1 C)  98.7 F (37.1 C)  TempSrc: Oral Oral  Oral  SpO2:  98% 99% 99%  Weight:      Height:       Body mass index is 22.53 kg/m.  Physical Exam Constitutional:      Appearance: Normal appearance.  HENT:     Head: Normocephalic and atraumatic.     Right Ear: Tympanic membrane normal.     Left Ear: Tympanic membrane normal.     Nose: Nose normal.     Mouth/Throat:     Mouth: Mucous membranes are moist.  Eyes:     Extraocular Movements: Extraocular movements intact.     Conjunctiva/sclera: Conjunctivae normal.     Pupils: Pupils are equal, round, and reactive to light.  Cardiovascular:     Rate and Rhythm: Normal rate and regular rhythm.     Heart sounds: No murmur heard.    No friction rub. No gallop.  Pulmonary:     Effort: Pulmonary effort is normal.     Breath sounds: Normal breath sounds.  Abdominal:     General: Abdomen is flat.     Palpations: Abdomen is soft.  Musculoskeletal:     Comments: Groin wound  Skin:    General: Skin is warm and dry.  Neurological:     General: No focal deficit present.     Mental Status: She is alert and oriented to person, place, and time.  Psychiatric:        Mood and Affect: Mood normal.       Lab Results Lab Results  Component Value Date   WBC 1.2 (LL) 03/21/2022   HGB 6.9 (LL) 03/21/2022   HCT 21.3 (L) 03/21/2022   MCV 92.2 03/21/2022   PLT 27 (LL) 03/21/2022    Lab Results  Component Value Date   CREATININE 7.88 (H)  03/21/2022   BUN 92 (H) 03/21/2022   NA 140 03/21/2022   K 3.8 03/21/2022   CL 112 (H) 03/21/2022   CO2 12 (L) 03/21/2022    Lab Results  Component Value Date   ALT 9 03/19/2022   AST 22 03/19/2022   ALKPHOS 59 03/19/2022   BILITOT 0.7 03/19/2022        Laurice Record, [facility] 03/21/2022, 6:15 AM

## 2022-03-21 NOTE — Progress Notes (Signed)
[facility] Progress Note Patient Name: Christina Mack DOB: [birth year] MRN: [id number]   Date of Service  03/21/2022  HPI/Events of Note  Pain - No relief with Tylenol. Takes Norco at home; however, has documented allergies to Hydromorphone, Oxycodone and Codeine.   eICU Interventions  Plan: Fentanyl 12.5 mcg IV X 1.      Intervention Category Major Interventions: Other:  Demarkis Gheen Cornelia Copa 03/21/2022, 12:00 AM

## 2022-03-21 NOTE — Progress Notes (Signed)
[facility] Progress Note Patient Name: EMERIE VANDERKOLK DOB: 07/29/1965 MRN: [id number]   Date of Service  03/21/2022  HPI/Events of Note  Patient c/o pain again - No relief with Tylenol. Takes Norco at home; however, has documented allergies to Hydromorphone, Oxycodone and Codeine.    eICU Interventions  Plan: Fentanyl 12.5 mcg IV x 1.      Intervention Category Major Interventions: Other:  Aarush Stukey Cornelia Copa 03/21/2022, 5:00 AM

## 2022-03-22 ENCOUNTER — Inpatient Hospital Stay (HOSPITAL_COMMUNITY)

## 2022-03-22 ENCOUNTER — Encounter: Payer: Self-pay | Admitting: Orthopaedic Surgery

## 2022-03-22 DIAGNOSIS — Z7189 Other specified counseling: Secondary | ICD-10-CM

## 2022-03-22 DIAGNOSIS — B965 Pseudomonas (aeruginosa) (mallei) (pseudomallei) as the cause of diseases classified elsewhere: Secondary | ICD-10-CM

## 2022-03-22 DIAGNOSIS — Z515 Encounter for palliative care: Secondary | ICD-10-CM | POA: Diagnosis not present

## 2022-03-22 DIAGNOSIS — R7881 Bacteremia: Secondary | ICD-10-CM

## 2022-03-22 DIAGNOSIS — N19 Unspecified kidney failure: Secondary | ICD-10-CM | POA: Diagnosis not present

## 2022-03-22 DIAGNOSIS — R4589 Other symptoms and signs involving emotional state: Secondary | ICD-10-CM

## 2022-03-22 DIAGNOSIS — M8660 Other chronic osteomyelitis, unspecified site: Secondary | ICD-10-CM

## 2022-03-22 DIAGNOSIS — R531 Weakness: Secondary | ICD-10-CM

## 2022-03-22 DIAGNOSIS — N179 Acute kidney failure, unspecified: Secondary | ICD-10-CM | POA: Diagnosis not present

## 2022-03-22 LAB — BPAM RBC
Blood Product Expiration Date: 202401122359
Blood Product Expiration Date: 202401192359
Blood Product Expiration Date: 202401192359
ISSUE DATE / TIME: 202312171541
ISSUE DATE / TIME: 202312180114
ISSUE DATE / TIME: 202312190526
Unit Type and Rh: 5100
Unit Type and Rh: 5100
Unit Type and Rh: 5100

## 2022-03-22 LAB — TYPE AND SCREEN
ABO/RH(D): O POS
Antibody Screen: NEGATIVE
Unit division: 0
Unit division: 0
Unit division: 0

## 2022-03-22 LAB — DIC (DISSEMINATED INTRAVASCULAR COAGULATION)PANEL
D-Dimer, Quant: 3.55 ug/mL-FEU — ABNORMAL HIGH (ref 0.00–0.50)
Fibrinogen: 599 mg/dL — ABNORMAL HIGH (ref 210–475)
INR: 1.2 (ref 0.8–1.2)
Platelets: 15 10*3/uL — CL (ref 150–400)
Prothrombin Time: 15.4 seconds — ABNORMAL HIGH (ref 11.4–15.2)
Smear Review: NONE SEEN
aPTT: 35 seconds (ref 24–36)

## 2022-03-22 LAB — RENAL FUNCTION PANEL
Albumin: 2.2 g/dL — ABNORMAL LOW (ref 3.5–5.0)
Anion gap: 14 (ref 5–15)
BUN: 98 mg/dL — ABNORMAL HIGH (ref 6–20)
CO2: 14 mmol/L — ABNORMAL LOW (ref 22–32)
Calcium: 6.9 mg/dL — ABNORMAL LOW (ref 8.9–10.3)
Chloride: 111 mmol/L (ref 98–111)
Creatinine, Ser: 7.83 mg/dL — ABNORMAL HIGH (ref 0.44–1.00)
GFR, Estimated: 6 mL/min — ABNORMAL LOW (ref >60)
Glucose, Bld: 124 mg/dL — ABNORMAL HIGH (ref 70–99)
Phosphorus: 6.6 mg/dL — ABNORMAL HIGH (ref 2.5–4.6)
Potassium: 3.3 mmol/L — ABNORMAL LOW (ref 3.5–5.1)
Sodium: 139 mmol/L (ref 135–145)

## 2022-03-22 LAB — CBC
HCT: 23.6 % — ABNORMAL LOW (ref 36.0–46.0)
Hemoglobin: 7.7 g/dL — ABNORMAL LOW (ref 12.0–15.0)
MCH: 30.2 pg (ref 26.0–34.0)
MCHC: 32.6 g/dL (ref 30.0–36.0)
MCV: 92.5 fL (ref 80.0–100.0)
Platelets: 18 10*3/uL — CL (ref 150–400)
RBC: 2.55 MIL/uL — ABNORMAL LOW (ref 3.87–5.11)
RDW: 16.9 % — ABNORMAL HIGH (ref 11.5–15.5)
WBC: 1.2 10*3/uL — CL (ref 4.0–10.5)
nRBC: 0 % (ref 0.0–0.2)

## 2022-03-22 LAB — RETICULOCYTES
Immature Retic Fract: 0 % — ABNORMAL LOW (ref 2.3–15.9)
RBC.: 2.58 MIL/uL — ABNORMAL LOW (ref 3.87–5.11)
Retic Ct Pct: <0.4 % — ABNORMAL LOW (ref 0.4–3.1)

## 2022-03-22 MED ORDER — POTASSIUM CHLORIDE CRYS ER 20 MEQ PO TBCR
20.0000 meq | EXTENDED_RELEASE_TABLET | Freq: Once | ORAL | Status: AC
  Administered 2022-03-22: 20 meq via ORAL
  Filled 2022-03-22: qty 1

## 2022-03-22 MED ORDER — SODIUM CHLORIDE 0.9 % IV SOLN
500.0000 mg | INTRAVENOUS | Status: DC
  Administered 2022-03-23 – 2022-03-25 (×3): 500 mg via INTRAVENOUS
  Filled 2022-03-22 (×3): qty 10

## 2022-03-22 MED ORDER — CARVEDILOL 12.5 MG PO TABS
12.5000 mg | ORAL_TABLET | Freq: Two times a day (BID) | ORAL | Status: DC
  Administered 2022-03-22 – 2022-03-25 (×8): 12.5 mg via ORAL
  Filled 2022-03-22 (×8): qty 1

## 2022-03-22 MED ORDER — SODIUM CHLORIDE 0.9 % IV SOLN
1.0000 g | Freq: Once | INTRAVENOUS | Status: AC
  Administered 2022-03-22: 1 g via INTRAVENOUS
  Filled 2022-03-22: qty 20

## 2022-03-22 MED ORDER — SODIUM CHLORIDE 0.9% IV SOLUTION: Freq: Once | INTRAVENOUS | Status: AC

## 2022-03-22 MED ORDER — DIPHENHYDRAMINE HCL 25 MG PO CAPS
25.0000 mg | ORAL_CAPSULE | Freq: Once | ORAL | Status: AC
  Administered 2022-03-22: 25 mg via ORAL
  Filled 2022-03-22: qty 1

## 2022-03-22 MED ORDER — TECHNETIUM TC 99M MEDRONATE IV KIT
20.0000 | PACK | Freq: Once | INTRAVENOUS | Status: AC | PRN
  Administered 2022-03-22: 21.1 via INTRAVENOUS

## 2022-03-22 NOTE — Progress Notes (Addendum)
PROGRESS NOTE Christina Mack  KYH:[id number] DOB: [birth year] DOA: 03/19/2022 PCP: System, Provider Not In   Brief Narrative/Hospital Course: 56yof w/hx vulvar CA s/p chemo/XRT complicated by chronic R groin/inguinal open wound with known osteomyelitis, supposed to be seeing ortho onc at [place] at some point, followed by ID here for chronic wound, has been on Linezolid for a few months presented with generalized weakness and decreased oral intake x 3 days Patient seen in the ED found to have severe hyperkalemia pancytopenia lactic acidosis FOBT positive admitted to ICU, with weakness and pancytopenia felt to be 2/2 Linezolid, changed to vancomycin seen.  She has been needing blood transfusion and platelet transfusion but no ongoing bleeding.  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 Patient stabilized transferred to [facility] 12/19. 12/19 Nursing unable to insert Foley catheter urology consulted but patient refused     Subjective: Seen and examined this morning Overall she is feeling better pain is controlled Reports she will try to get up and void with physical therapy today Still does not want Foley catheter in understanding that she may end up in dialysis Overnight afebrile BP stable Labs with potassium 3.3 bicarb 14 creatinine 7.8 hemoglobin stable 10.7 platelet on lower side 18 Nursing reports patient is asking to go home, but understands she needs to be hospitalized  Assessment and Plan: Principal Problem:   AKI (acute kidney injury) ([place]) Active Problems:   Pancytopenia ([place])   Pressure injury of skin   Pancytopenia: Felt to be due to linezolid.  ID following holding linezolid for now continue vancomycin.  Will ask hematology input, trend CBC count as below platelet further dropping but continued evidence of bleeding Recent Labs  Lab 03/20/22 1535 03/21/22 0253 03/21/22 1148 03/22/22 0247  HGB 7.2* 6.9* 7.8* 7.7*  HCT 22.4* 21.3* 23.5* 23.6*  WBC  1.0* 1.2*  --  1.2*  PLT 30* 27*  --  18*    Pseudomonas bacteremia Concern for severe sepsis Pelvic Osteomyelitis and open groin wound: ID input appreciated.Repeat CT completed 12/18> CT report shows asymmetric soft tissue thickening on upper right perineum, chronic open soft tissue wound underlying the right inferior pubic ramus, increased destruction/discontinue T of the right inferior pubic ramus compared to PET/CT on 4/19.  Chronic pathologic appearing fracture deformity around right groin.  Pseudomonas is resistant to Cipro so no oral option, ID managing currently on cefepime, doxycycline p.o.  Given her right hip prosthesis NM bone scan ordered.  Continue pain control with oral Norco Recent Labs  Lab 03/19/22 1400 03/19/22 1524 03/19/22 2157 03/20/22 0304 03/20/22 1535 03/21/22 0253 03/22/22 0247  WBC  --   --  1.5* 1.3* 1.0* 1.2* 1.2*  LATICACIDVEN 3.9* 3.3*  --   --   --   --   --     Diarrhea negative for C. difficile antigen.  Continue symptomatic management  AKI on CKD 5 with metabolic acidosis- b/l creat 5.7-7.1 Renal transplant status x2 Hyperkalemia resolved: Nephrology on board.  Suspected ischemic ATN also multifactorial in the setting of decreased oral intake recurrent vomiting acute blood loss anemia.Avoid obstruction medication including NSAIDs contrast, monitor intake output Daily weight and renal function per nephrology.  No evidence of volume overload.  With baseline poor functional pending dialysis, no vascular access yet has been referred to [facility] for a third transplant but is high risk due to history of ongoing osteomyelitis.  Immunosuppression on hold -discussed w/ nephro- cont to hold 2/2 pancytopenia and will resume once  stableContinue steroid iv. Recent Labs  Lab 03/19/22 1202 03/19/22 2157 03/20/22 0304 03/21/22 0253 03/22/22 0247  BUN 93* 89* 89* 92* 98*  CREATININE 9.30* 8.49* 8.35* 7.88* 7.83*   Urine retention seen in CT- nephro advised foley-12/19  Nursing unable to insert Foley catheter urology consulted but patient refused> understanding that it may make her AKI sepsis worse.  She wants to work with PT and void today  Acute blood loss anemia Anemia of chronic renal disease Upper GI bleeding : High iron saturation and ferritin high no need for IV iron.  ESA per nephrology.  Needing multiple PRBC transfusion.  Monitor and transfuse for < 7gm.  Reports no rectal bleeding, having brown stool.  Eliquis is on hold Recent Labs  Lab 03/20/22 0843 03/20/22 1535 03/21/22 0253 03/21/22 1148 03/22/22 0247  HGB 7.4* 7.2* 6.9* 7.8* 7.7*  HCT 22.6* 22.4* 21.3* 23.5* 23.6*    History of DVT 2021.  Recent duplex negative Eliquis on hold. Hemocculst was + but stool is brown.monitor and resume once safe.  Hypertension blood pressure uncontrolled this morning resumed Coreg at half of home dose 12.5 mg  Pressure ulcer in mid sacrum POA continue wound care. Pressure Injury 03/19/22 Sacrum Mid Stage 2 -  Partial thickness loss of dermis presenting as a shallow open injury with a red, pink wound bed without slough. (Active)  03/19/22 1853  Location: Sacrum  Location Orientation: Mid  Staging: Stage 2 -  Partial thickness loss of dermis presenting as a shallow open injury with a red, pink wound bed without slough.  Wound Description (Comments):   Present on Admission: Yes   Recent right THA and tibial plateau fracture October 2023 History of valvular carcinoma 2021- treated with chemoradiation  GOC: Full code.  Patient refusing starting care, requested palliative care  DVT prophylaxis: SCDs Start: 03/19/22 1636 Code Status:   Code Status: Full Code Family Communication: plan of care discussed with patient at bedside. Patient status is: Inpatient because of severe sepsis, renal failure Level of care: Stepdown  Dispo: The patient is from: HOME            Anticipated disposition: TBD Objective: Vitals last 24 hrs: Vitals:   03/22/22 0000  03/22/22 0100 03/22/22 0200 03/22/22 0400  BP: (!) 140/88 133/85 125/75 125/77  Pulse: 79 67 73 75  Resp: '19 18 19 17  '$ Temp: 97.8 F (36.6 C)   97.7 F (36.5 C)  TempSrc: Oral   Oral  SpO2: 99% 99% 98% 99%  Weight:      Height:       Weight change:   Physical Examination: General exam: AAox3, weak,older appearing HEENT:Oral mucosa moist, Ear/Nose WNL grossly, dentition normal. Respiratory system: bilaterally clear BS,no use of accessory muscle Cardiovascular system: S1 & S2 +, regular rate, JVD neg. Gastrointestinal system: Abdomen soft,NT,ND,BS+ Nervous System:Alert, awake, moving extremities and grossly nonfocal Extremities: LE ankle edema neg, lower extremities warm Skin: No rashes,no icterus. MSK: Normal muscle bulk,tone, power   Medications reviewed:  Scheduled Meds:  sodium chloride   Intravenous Once   sodium chloride   Intravenous Once   Chlorhexidine Gluconate Cloth  6 each Topical Daily   doxycycline  100 mg Oral Q12H   hydrocortisone sodium succinate  100 mg Intravenous Daily   mouth rinse  15 mL Mouth Rinse 4 times per day   pantoprazole (PROTONIX) IV  40 mg Intravenous Q12H   Continuous Infusions:  ceFEPime (MAXIPIME) IV Stopped (03/21/22 1657)   Diet Order  Diet renal with fluid restriction Fluid restriction: 2000 mL Fluid; Room service appropriate? Yes; Fluid consistency: Thin  Diet effective now                  Intake/Output Summary (Last 24 hours) at 03/22/2022 0811 Last data filed at 03/22/2022 0600 Gross per 24 hour  Intake 1744 ml  Output 1200 ml  Net 544 ml   Net IO Since Admission: 3,385.31 mL [03/22/22 0811]  Wt Readings from Last 3 Encounters:  03/21/22 61.4 kg  03/09/22 63.5 kg  01/22/22 69 kg     Unresulted Labs (From admission, onward)     Start     Ordered   03/22/22 0500  CBC  Daily,   R     Question:  Specimen collection method  Answer:  Lab=Lab collect   03/21/22 1020   03/21/22 0500  Renal function panel   Daily,   R     Question:  Specimen collection method  Answer:  Lab=Lab collect   03/20/22 1153          Data Reviewed: I have personally reviewed following labs and imaging studies CBC: Recent Labs  Lab 03/19/22 1202 03/19/22 2157 03/20/22 0304 03/20/22 0843 03/20/22 1535 03/21/22 0253 03/21/22 1148 03/22/22 0247  WBC 3.6* 1.5* 1.3*  --  1.0* 1.2*  --  1.2*  NEUTROABS 2.3  --   --   --  0.6*  --   --   --   HGB 6.4* 6.3* 6.1* 7.4* 7.2* 6.9* 7.8* 7.7*  HCT 21.0* 19.5* 18.8* 22.6* 22.4* 21.3* 23.5* 23.6*  MCV 96.3 94.7 91.7  --  93.3 92.2  --  92.5  PLT 31* 29* 23*  --  30* 27*  --  18*   Basic Metabolic Panel: Recent Labs  Lab 03/19/22 1202 03/19/22 2157 03/20/22 0304 03/21/22 0253 03/22/22 0247  NA 136 139 138 140 139  K 6.7* 5.3* 4.5 3.8 3.3*  CL 111 112* 112* 112* 111  CO2 <7* 9* 11* 12* 14*  GLUCOSE 99 139* 150* 130* 124*  BUN 93* 89* 89* 92* 98*  CREATININE 9.30* 8.49* 8.35* 7.88* 7.83*  CALCIUM 7.9* 7.3* 6.9* 6.6* 6.9*  PHOS  --   --   --  7.1* 6.6*   GFR: Estimated Creatinine Clearance: 7.3 mL/min (A) (by C-G formula based on SCr of 7.83 mg/dL (H)). Liver Function Tests: Recent Labs  Lab 03/19/22 1202 03/21/22 0253 03/22/22 0247  AST 22  --   --   ALT 9  --   --   ALKPHOS 59  --   --   BILITOT 0.7  --   --   PROT 7.7  --   --   ALBUMIN 3.4* 2.7* 2.2*   Recent Labs  Lab 03/19/22 1202  LIPASE 83*   Recent Labs  Lab 03/19/22 1400 03/19/22 1524  LATICACIDVEN 3.9* 3.3*    Recent Results (from the past 240 hour(s))  Resp panel by RT-PCR (RSV, Flu A&B, Covid) Anterior Nasal Swab     Status: None   Collection Time: 03/19/22 12:02 PM   Specimen: Anterior Nasal Swab  Result Value Ref Range Status   SARS Coronavirus 2 by RT PCR NEGATIVE NEGATIVE Final    Comment: (NOTE) SARS-CoV-2 target nucleic acids are NOT DETECTED.  The SARS-CoV-2 RNA is generally detectable in upper respiratory specimens during the acute phase of infection. The  lowest concentration of SARS-CoV-2 viral copies this assay can detect is 138 copies/mL. A  negative result does not preclude SARS-Cov-2 infection and should not be used as the sole basis for treatment or other patient management decisions. A negative result may occur with  improper specimen collection/handling, submission of specimen other than nasopharyngeal swab, presence of viral mutation(s) within the areas targeted by this assay, and inadequate number of viral copies(<138 copies/mL). A negative result must be combined with clinical observations, patient history, and epidemiological information. The expected result is Negative.  Fact Sheet for Patients:  EntrepreneurPulse.com.au  Fact Sheet for Healthcare Providers:  IncredibleEmployment.be  This test is no t yet approved or cleared by the [place] FDA and  has been authorized for detection and/or diagnosis of SARS-CoV-2 by FDA under an Emergency Use Authorization (EUA). This EUA will remain  in effect (meaning this test can be used) for the duration of the COVID-19 declaration under Section 564(b)(1) of the Act, 21 U.S.C.section 360bbb-3(b)(1), unless the authorization is terminated  or revoked sooner.       Influenza A by PCR NEGATIVE NEGATIVE Final   Influenza B by PCR NEGATIVE NEGATIVE Final    Comment: (NOTE) The Xpert Xpress SARS-CoV-2/FLU/RSV plus assay is intended as an aid in the diagnosis of influenza from Nasopharyngeal swab specimens and should not be used as a sole basis for treatment. Nasal washings and aspirates are unacceptable for Xpert Xpress SARS-CoV-2/FLU/RSV testing.  Fact Sheet for Patients: EntrepreneurPulse.com.au  Fact Sheet for Healthcare Providers: IncredibleEmployment.be  This test is not yet approved or cleared by the [place] FDA and has been authorized for detection and/or diagnosis of SARS-CoV-2 by FDA under  an Emergency Use Authorization (EUA). This EUA will remain in effect (meaning this test can be used) for the duration of the COVID-19 declaration under Section 564(b)(1) of the Act, 21 U.S.C. section 360bbb-3(b)(1), unless the authorization is terminated or revoked.     Resp Syncytial Virus by PCR NEGATIVE NEGATIVE Final    Comment: (NOTE) Fact Sheet for Patients: EntrepreneurPulse.com.au  Fact Sheet for Healthcare Providers: IncredibleEmployment.be  This test is not yet approved or cleared by the [place] FDA and has been authorized for detection and/or diagnosis of SARS-CoV-2 by FDA under an Emergency Use Authorization (EUA). This EUA will remain in effect (meaning this test can be used) for the duration of the COVID-19 declaration under Section 564(b)(1) of the Act, 21 U.S.C. section 360bbb-3(b)(1), unless the authorization is terminated or revoked.  Performed at [facility], [place] [address], [place], [place] [address]   Culture, blood (routine x 2)     Status: None (Preliminary result)   Collection Time: 03/19/22  1:29 PM   Specimen: BLOOD  Result Value Ref Range Status   Specimen Description   Final    BLOOD LEFT ANTECUBITAL Performed at [place] [address], [place], [place] [address]    Special Requests   Final    BOTTLES DRAWN AEROBIC AND ANAEROBIC Blood Culture adequate volume Performed at [place] [address], [place], [place] [address]    Culture   Final    NO GROWTH 3 DAYS Performed at [facility], [place] [address], [place], [place] [address]    Report Status PENDING  Incomplete  Culture, blood (routine x 2)     Status: Abnormal   Collection Time: 03/19/22  2:00 PM   Specimen: BLOOD  Result Value Ref Range Status   Specimen Description   Final    BLOOD RIGHT ANTECUBITAL Performed at [facility], [place] [address],  [place], [place] [address]    Special Requests   Final    BOTTLES DRAWN AEROBIC AND ANAEROBIC Blood Culture results may not be optimal due to an excessive volume of blood received in culture bottles Performed at [place] [address], [place], [place] [address]    Culture  Setup Time   Final    GRAM NEGATIVE RODS ANAEROBIC BOTTLE ONLY CRITICAL RESULT CALLED TO, READ BACK BY AND VERIFIED WITH:  C/ PHARMD M. BELL 03/20/22 1342 A. LAFRANCE Performed at [facility], [place] [address], [place], [place] [address]    Culture PSEUDOMONAS AERUGINOSA (A)  Final   Report Status 03/22/2022 FINAL  Final   Organism ID, Bacteria PSEUDOMONAS AERUGINOSA  Final      Susceptibility   Pseudomonas aeruginosa - MIC*    CEFTAZIDIME 2 SENSITIVE Sensitive     CIPROFLOXACIN >=4 RESISTANT Resistant     GENTAMICIN <=1 SENSITIVE Sensitive     IMIPENEM 1 SENSITIVE Sensitive     PIP/TAZO 8 SENSITIVE Sensitive     * PSEUDOMONAS AERUGINOSA  Blood Culture ID Panel (Reflexed)     Status: Abnormal   Collection Time: 03/19/22  2:00 PM  Result Value Ref Range Status   Enterococcus faecalis NOT DETECTED NOT DETECTED Final   Enterococcus Faecium NOT DETECTED NOT DETECTED Final   Listeria monocytogenes NOT DETECTED NOT DETECTED Final   Staphylococcus species NOT DETECTED NOT DETECTED Final   Staphylococcus aureus (BCID) NOT DETECTED NOT DETECTED Final   Staphylococcus epidermidis NOT DETECTED NOT DETECTED Final   Staphylococcus lugdunensis NOT DETECTED NOT DETECTED Final   Streptococcus species NOT DETECTED NOT DETECTED Final   Streptococcus agalactiae NOT DETECTED NOT DETECTED Final   Streptococcus pneumoniae NOT DETECTED NOT DETECTED Final   Streptococcus pyogenes NOT DETECTED NOT DETECTED Final   A.calcoaceticus-baumannii NOT DETECTED NOT DETECTED Final   Bacteroides fragilis NOT DETECTED NOT DETECTED Final   Enterobacterales NOT DETECTED NOT DETECTED Final   Enterobacter cloacae complex  NOT DETECTED NOT DETECTED Final   Escherichia coli NOT DETECTED NOT DETECTED Final   Klebsiella aerogenes NOT DETECTED NOT DETECTED Final   Klebsiella oxytoca NOT DETECTED NOT DETECTED Final   Klebsiella pneumoniae NOT DETECTED NOT DETECTED Final   Proteus species NOT DETECTED NOT DETECTED Final   Salmonella species NOT DETECTED NOT DETECTED Final   Serratia marcescens NOT DETECTED NOT DETECTED Final   Haemophilus influenzae NOT DETECTED NOT DETECTED Final   Neisseria meningitidis NOT DETECTED NOT DETECTED Final   Pseudomonas aeruginosa DETECTED (A) NOT DETECTED Final    Comment: CRITICAL RESULT CALLED TO, READ BACK BY AND VERIFIED WITH:  C/ PHARMD M. BELL 03/20/22 1342 A. LAFRANCE    Stenotrophomonas maltophilia NOT DETECTED NOT DETECTED Final   Candida albicans NOT DETECTED NOT DETECTED Final   Candida auris NOT DETECTED NOT DETECTED Final   Candida glabrata NOT DETECTED NOT DETECTED Final   Candida krusei NOT DETECTED NOT DETECTED Final   Candida parapsilosis NOT DETECTED NOT DETECTED Final   Candida tropicalis NOT DETECTED NOT DETECTED Final   Cryptococcus neoformans/gattii NOT DETECTED NOT DETECTED Final   CTX-M ESBL NOT DETECTED NOT DETECTED Final   Carbapenem resistance IMP NOT DETECTED NOT DETECTED Final   Carbapenem resistance KPC NOT DETECTED NOT DETECTED Final   Carbapenem resistance NDM NOT DETECTED NOT DETECTED Final   Carbapenem resistance VIM NOT DETECTED NOT DETECTED Final    Comment: Performed at [facility], [address] [place], [place] [address]  MRSA Next Gen by PCR, Nasal     Status: None   Collection Time: 03/19/22  6:58 PM   Specimen: Nasal Mucosa; Nasal Swab  Result Value Ref Range Status   MRSA by PCR Next Gen NOT DETECTED NOT DETECTED Final    Comment: (NOTE) The GeneXpert MRSA Assay (FDA approved for NASAL specimens only), is one component of a comprehensive MRSA colonization surveillance program. It is not intended to diagnose MRSA infection  nor to guide or monitor treatment for MRSA infections. Test performance is not FDA approved in patients less than 80 years old. Performed at [facility], [place] [address], [place], [place] [address]   Urine Culture     Status: Abnormal   Collection Time: 03/20/22 12:32 AM   Specimen: Urine, Clean Catch  Result Value Ref Range Status   Specimen Description   Final    URINE, CLEAN CATCH Performed at [facility], [place] [address], [place], [place] [address]    Special Requests   Final    NONE Performed at [facility], [place] [address] [place], [place] [address]    Culture (A)  Final    <10,000 COLONIES/mL INSIGNIFICANT GROWTH Performed at [place] [address], [place], [place] [address]    Report Status 03/21/2022 FINAL  Final  C Difficile Quick Screen w PCR reflex     Status: None   Collection Time: 03/20/22  6:46 PM   Specimen: STOOL  Result Value Ref Range Status   C Diff antigen NEGATIVE NEGATIVE Final   C Diff toxin NEGATIVE NEGATIVE Final   C Diff interpretation No C. difficile detected.  Final    Comment: Performed at [facility], [place] [address] [place], [place] [address]    Antimicrobials: Anti-infectives (From admission, onward)    Start     Dose/Rate Route Frequency Ordered Stop   03/21/22 1000  doxycycline (VIBRA-TABS) tablet 100 mg        100 mg Oral Every 12 hours 03/21/22 0828     03/21/22 0845  vancomycin (VANCOCIN) IVPB 1000 mg/200 mL premix  Status:  Discontinued        1,000 mg 200 mL/hr over 60 Minutes Intravenous  Once 03/21/22 0755 03/21/22 0828   03/20/22 1400  ceFEPIme (MAXIPIME) 1 g in sodium chloride 0.9 % 100 mL IVPB        1 g 200 mL/hr over 30 Minutes Intravenous Every 24 hours 03/19/22 1719     03/19/22 1719  vancomycin variable dose per unstable renal function (pharmacist dosing)  Status:  Discontinued         Does not apply See admin instructions 03/19/22  1719 03/21/22 0828   03/19/22 1400  vancomycin (VANCOCIN) IVPB 1000 mg/200 mL premix        1,000 mg 200 mL/hr over 60 Minutes Intravenous  Once 03/19/22 1348 03/19/22 1628   03/19/22 1400  ceFEPIme (MAXIPIME) 2 g in sodium chloride 0.9 % 100 mL IVPB        2 g 200 mL/hr over 30 Minutes Intravenous  Once 03/19/22 1348 03/19/22 1511      Culture/Microbiology    Component Value Date/Time   SDES  03/20/2022 0032    URINE, CLEAN CATCH Performed at [facility], [place] [address] [place], [place] [address]    SPECREQUEST  03/20/2022 0032    NONE Performed at [facility], [address] [place], [place] [address]    CULT (A) 03/20/2022 0032    <10,000 COLONIES/mL INSIGNIFICANT GROWTH Performed at [place] [address] [place], [place] [address]    REPTSTATUS 03/21/2022 FINAL 03/20/2022 0032  Radiology Studies: US PELVIS (TRANSABDOMINAL ONLY)  Result Date: 03/21/2022 CLINICAL DATA:  56 year old woman with history of vulvar carcinoma and chronic pelvic osteomyelitis. Chronic thickening of right labial soft tissues. EXAM: LIMITED ULTRASOUND OF PELVIS TECHNIQUE: Limited transabdominal ultrasound examination of the pelvis was performed. COMPARISON:  CT abdomen pelvis 03/20/2022 FINDINGS: Targeted sonographic evaluation of the area of concern in the right labial region demonstrates soft tissue thickening without discrete fluid collection. IMPRESSION: Sonographic evaluation of the area of concern in the right labia demonstrates soft tissue thickening similar to prior CT scans dating back to 04/03/2020. This may be due to chronic inflammation or postsurgical change. No discrete fluid collection identified to indicate abscess. Electronically Signed   By: Miachel Roux M.D.   On: 03/21/2022 14:08   CT ABDOMEN PELVIS WO CONTRAST  Result Date: 03/20/2022 CLINICAL DATA:  Pelvic osteomyelitis. EXAM: CT ABDOMEN AND PELVIS WITHOUT CONTRAST TECHNIQUE:  Multidetector CT imaging of the abdomen and pelvis was performed following the standard protocol without IV contrast. RADIATION DOSE REDUCTION: This exam was performed according to the departmental dose-optimization program which includes automated exposure control, adjustment of the mA and/or kV according to patient size and/or use of iterative reconstruction technique. COMPARISON:  PET-CT dated 03/09/2022. CT abdomen and pelvis dated 04/03/2020. FINDINGS: Lower chest: No acute abnormality. Hepatobiliary: No focal liver abnormality is seen. Gallbladder is distended but otherwise unremarkable. No bile duct dilatation is seen. Pancreas: Unremarkable. No pancreatic ductal dilatation or surrounding inflammatory changes. Spleen: Normal in size without focal abnormality. Adrenals/Urinary Tract: Native kidneys are atrophic. 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 Additional hydronephrosis of the somewhat atrophic-appearing transplant kidney in the LEFT pelvis. Bladder is moderately distended but otherwise unremarkable. Stomach/Bowel: No dilated large or small bowel loops are seen within the abdomen or pelvis. Stomach is unremarkable. Vascular/Lymphatic: No abdominal aortic aneurysm. Asymmetrically prominent LEFT common iliac vein, of uncertain significance. No enlarged lymph nodes are identified within the abdomen or pelvis. Reproductive: Presumed hysterectomy.  No adnexal mass or free fluid. Other: Asymmetric soft tissue thickening involving the upper RIGHT perineum, incompletely imaged, with soft tissue wound underlying the RIGHT inferior pubic ramus. Destructive/lytic changes of the bilateral symphysis pubis and RIGHT inferior pubic ramus (discontinuity of the mid/posterior portion of the RIGHT inferior pubic ramus), corresponding to patient's known pathologic process in this area and the previously imaging reports describing a chronic open wound of the RIGHT  perineum. However, there is increased destruction/discontinuity of the RIGHT inferior pubic ramus compared to the earlier PET-CT of 07/20/2021 indicating some progression. Symphysis pubis is stable. Musculoskeletal: Also, chronic pathologic-appearing fracture deformity at the lateral aspect of the RIGHT superior pubic ramus/anterior acetabulum. The adjacent RIGHT hip prosthesis appears intact and stable in position. Osseous structures of the abdomen are unremarkable. IMPRESSION: 1. Asymmetric soft tissue thickening involving the upper RIGHT perineum, incompletely imaged, with chronic open soft tissue wound underlying the RIGHT inferior pubic ramus. Associated destructive/lytic changes of the RIGHT inferior pubic ramus (discontinuity of the mid/posterior portion of the RIGHT inferior pubic ramus), corresponding to patient's known pathologic process in this area. However, there is increased destruction/discontinuity of the RIGHT inferior pubic ramus compared to the earlier PET-CT of 07/20/2021 indicating some degree of  progression. 2. Chronic pathologic-appearing fracture deformity at the lateral aspect of the RIGHT superior pubic ramus/anterior acetabulum. The adjacent RIGHT hip prosthesis appears intact and stable in position. 3. 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 Additional hydronephrosis of the somewhat atrophic-appearing transplant kidney in the LEFT pelvis. Bladder is distended, suggesting associated bladder outlet obstruction. Electronically Signed   By: Franki Cabot M.D.   On: 03/20/2022 14:50     LOS: 3 days   Antonieta Pert, MD [facility]  03/22/2022, 8:11 AM

## 2022-03-22 NOTE — Consult Note (Signed)
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  During hospital admission patient was found to have severe hyperkalemia, pancytopenia, and lactic acidosis and was admitted to the ICU for management.  Patient receiving management for Pseudomonas bacteremia with known open groin wound, AKI on CKD 5, urinary retention, acute blood loss anemia, and pressure ulcer that was present upon admission.  Patient being seen by hematology, nephrology, urology, and infectious disease.  Palliative medicine team consulted to assist with complex medical decision making.  Extensive review of EMR prior to seeing patient.  Patient last seen by palliative medicine team in 2022.  Patient has been seen multiple times by provider Wadie Lessen, NP.   Presented to bedside and introduced myself as a member of the palliative medicine team and my role in patient's care.  Inquired about patient's hospitalization and how she is feeling at this time.  Patient immediately noted great frustration with the fact that she has been unable to get up out of bed to go to a bedside commode.  She noted that she feels helpless when she is cognitively intact and not being allowed to stand up on her own.  She noted that prior to coming to the hospital she was getting up and walking to her own bathroom at home so this is incredibly important to her to maintain her dignity.  Empathized and acknowledged  with this difficult situation and noted would speak to physical therapist immediately after leaving the room though she could undergo evaluation to move around.  Patient appreciates this.  Spent time exploring what has been going on outside of the hospital.  Patient notes that she has been diligently working to try to be seen at [facility] by a specialist at [facility] for considering surgical intervention of her vulvar area due to the open wound and complications.  She noted that she has to actually be seen by urologist first before she can be excepted by this other specialist.  She continues to diligently try to make appointments though has been difficult making sure that records are received for review.  Acknowledged her difficult situation and offered support via active listening.  Patient hopes to improve from this medical standpoint so that she can continue to work and follow-up at [facility].  Acknowledged patient's wishes and encouraged to continue discussions based on daily progress.  At this time patient so focused on needing to get up out of the bed to use the restroom, expressed hearing this need and so we will go find physical therapy for evaluation.  Patient voiced appreciation for this again.  Noted palliative medicine team would continue to follow along.  Primary Diagnoses  Present on Admission:  AKI (acute kidney injury) ([place])  Pancytopenia ([place])   Palliative Review of Systems: Denies symptoms of concern at this time.  Past Medical History:  Diagnosis Date   Complication of anesthesia    nausea and vomiting   DVT (deep venous thrombosis) ([place])  History of osteomyelitis    Kidney transplant recipient    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[facility])    Social History   Socioeconomic History   Marital status: Married    Spouse name: Not on file   Number of children: Not on file   Years of education: Not on file   Highest education level: Not on file  Occupational History   Not on file  Tobacco Use   Smoking  status: Never   Smokeless tobacco: Never  Vaping Use   Vaping Use: Never used  Substance and Sexual Activity   Alcohol use: Not Currently   Drug use: Never   Sexual activity: Not Currently  Other Topics Concern   Not on file  Social History Narrative   Not on file   Social Determinants of Health   Financial Resource Strain: Not on file  Food Insecurity: No Food Insecurity (03/20/2022)   Hunger Vital Sign    Worried About Running Out of Food in the Last Year: Never true    Ran Out of Food in the Last Year: Never true  Transportation Needs: No Transportation Needs (03/20/2022)   PRAPARE - Hydrologist (Medical): No    Lack of Transportation (Non-Medical): No  Physical Activity: Not on file  Stress: Not on file  Social Connections: Not on file   Family History  Problem Relation Age of Onset   Cerebral aneurysm Mother    AAA (abdominal aortic aneurysm) Brother    Breast cancer Paternal Aunt    Heart attack Maternal Grandmother    Brain cancer Maternal Grandfather    Colon cancer Neg Hx    Ovarian cancer Neg Hx    Endometrial cancer Neg Hx    Pancreatic cancer Neg Hx    Prostate cancer Neg Hx    Scheduled Meds:  sodium chloride   Intravenous Once   sodium chloride   Intravenous Once   carvedilol  12.5 mg Oral BID WC   Chlorhexidine Gluconate Cloth  6 each Topical Daily   doxycycline  100 mg Oral Q12H   hydrocortisone sodium succinate  100 mg Intravenous Daily   mouth rinse  15 mL Mouth Rinse 4 times per day   pantoprazole (PROTONIX) IV  40 mg Intravenous Q12H   Continuous Infusions:  ceFEPime (MAXIPIME) IV Stopped (03/21/22 1657)   PRN Meds:.acetaminophen, docusate sodium, hydrALAZINE, HYDROcodone-acetaminophen, lip balm, ondansetron (ZOFRAN) IV, mouth rinse, polyethylene glycol, sodium chloride Allergies  Allergen Reactions   Oxycodone-Acetaminophen Itching, Nausea And Vomiting, Nausea Only, Rash and Other (See Comments)    Headaches,  also    Piperacillin-Tazobactam In Dex     Other Reaction(s): Other (See Comments)  Thrombocytopenia  Thrombocytopenia   Zosyn [Piperacillin Sod-Tazobactam So] Other (See Comments)    Thrombocytopenia   Hydromorphone Nausea And Vomiting, Rash and Other (See Comments)   Amlodipine Other (See Comments) and Swelling    Ankle swelling   Codeine     vomiting   CBC:    Component Value Date/Time   WBC 1.2 (LL) 03/22/2022 0247   HGB 7.7 (L) 03/22/2022 0247   HGB 10.5 (L) 11/26/2012 1758   HCT 23.6 (L) 03/22/2022 0247   HCT 31.4 (L) 11/26/2012 1758   PLT 18 (LL) 03/22/2022 0247   PLT 190 11/26/2012 1758   MCV 92.5 03/22/2022 0247   MCV 89 11/26/2012 1758   NEUTROABS 0.6 (L) 03/20/2022 1535   NEUTROABS 7.2 (H) 11/26/2012 1758   LYMPHSABS 0.3 (L) 03/20/2022 1535  LYMPHSABS 2.4 11/26/2012 1758   MONOABS 0.1 03/20/2022 1535   MONOABS 0.7 11/26/2012 1758   EOSABS 0.0 03/20/2022 1535   EOSABS 0.1 11/26/2012 1758   BASOSABS 0.0 03/20/2022 1535   BASOSABS 0.0 11/26/2012 1758   Comprehensive Metabolic Panel:    Component Value Date/Time   NA 139 03/22/2022 0247   NA 139 11/26/2012 1758   K 3.3 (L) 03/22/2022 0247   K 4.6 11/26/2012 1758   CL 111 03/22/2022 0247   CL 105 11/26/2012 1758   CO2 14 (L) 03/22/2022 0247   CO2 22 11/26/2012 1758   BUN 98 (H) 03/22/2022 0247   BUN 33 (H) 11/26/2012 1758   CREATININE 7.83 (H) 03/22/2022 0247   CREATININE 2.93 (H) 06/08/2020 1544   GLUCOSE 124 (H) 03/22/2022 0247   GLUCOSE 88 11/26/2012 1758   CALCIUM 6.9 (L) 03/22/2022 0247   CALCIUM 9.7 11/26/2012 1758   AST 22 03/19/2022 1202   AST 11 (L) 11/26/2012 1758   ALT 9 03/19/2022 1202   ALT 19 11/26/2012 1758   ALKPHOS 59 03/19/2022 1202   ALKPHOS 82 11/26/2012 1758   BILITOT 0.7 03/19/2022 1202   BILITOT 0.5 11/26/2012 1758   PROT 7.7 03/19/2022 1202   PROT 8.2 11/26/2012 1758   ALBUMIN 2.2 (L) 03/22/2022 0247   ALBUMIN 4.0 11/26/2012 1758    Physical Exam: Vital Signs: BP  (!) 182/108 (BP Location: Right Arm) Comment: see MAR  Pulse 92   Temp 98.2 F (36.8 C) (Oral)   Resp 20   Ht '5\' 5"'$  (1.651 m)   Wt 61.4 kg   SpO2 100%   BMI 22.53 kg/m  SpO2: SpO2: 100 % O2 Device: O2 Device: Room Air O2 Flow Rate:   Intake/output summary:  Intake/Output Summary (Last 24 hours) at 03/22/2022 1038 Last data filed at 03/22/2022 0800 Gross per 24 hour  Intake 1240 ml  Output 1350 ml  Net -110 ml   LBM: Last BM Date : 03/21/22 Baseline Weight: Weight: 62.6 kg Most recent weight: Weight: 61.4 kg  General: NAD, alert, laying in bed chronically ill-appearing Eyes: conjunctiva clear, anicteric sclera HENT:  moist mucous membranes Cardiovascular: RRR Respiratory: no increased work of breathing noted, not in respiratory distress Abdomen: not distended Extremities: Moving upper extremities easily Skin: Reviewed wound pictures in EMR Neuro: A&Ox4, following commands easily Psych: appropriately answers all questions          Palliative Performance Scale: 50%              Additional Data Reviewed: Recent Labs    03/21/22 0253 03/21/22 1148 03/22/22 0247  WBC 1.2*  --  1.2*  HGB 6.9* 7.8* 7.7*  PLT 27*  --  18*  NA 140  --  139  BUN 92*  --  98*  CREATININE 7.88*  --  7.83*    Imaging: US PELVIS (TRANSABDOMINAL ONLY) CLINICAL DATA:  56 year old woman with history of vulvar carcinoma and chronic pelvic osteomyelitis. Chronic thickening of right labial soft tissues.  EXAM: LIMITED ULTRASOUND OF PELVIS  TECHNIQUE: Limited transabdominal ultrasound examination of the pelvis was performed.  COMPARISON:  CT abdomen pelvis 03/20/2022  FINDINGS: Targeted sonographic evaluation of the area of concern in the right labial region demonstrates soft tissue thickening without discrete fluid collection.  IMPRESSION: Sonographic evaluation of the area of concern in the right labia demonstrates soft tissue thickening similar to prior CT scans dating back to  04/03/2020. This may be due to chronic inflammation or postsurgical change.  No discrete fluid collection identified to indicate abscess.  Electronically Signed   By: Miachel Roux M.D.   On: 03/21/2022 14:08    I personally reviewed recent imaging.   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  During hospital admission patient was found to have severe hyperkalemia, pancytopenia, and lactic acidosis and was admitted to the ICU for management.  Patient receiving management for Pseudomonas bacteremia with known open groin wound, AKI on CKD 5, urinary retention, acute blood loss anemia, and pressure ulcer that was present upon admission.  Patient being seen by hematology, nephrology, urology, and infectious disease.  Palliative medicine team consulted to assist with complex medical decision making.  # Complex medical decision making/goals of care  -Able to introduce palliative medicine team to patient today.  Patient remains focused on trying to get to [facility] for follow-up with specialist.  Continue discussions daily based on patient's progress.  -  Code Status: Full Code   # Symptom management  -As per primary team  # Psycho-social/Spiritual Support:  - Support System: husband  # Discharge Planning:  TBD  Thank you for allowing the palliative care team to participate in the care [name].  Chelsea Aus, DO Palliative Care Provider PMT # [phone]  This provider spent a total of 80 minutes providing patient's care.  Includes review of EMR, discussing care with other staff members involved in patient's medical care,  obtaining relevant history and information from patient and/or patient's family, and personal review of imaging and lab work. Greater than 50% of the time was spent counseling and coordinating care related to the above assessment and plan.

## 2022-03-22 NOTE — Evaluation (Signed)
Occupational Therapy Evaluation Patient Details Name: Christina Mack MRN: [id number] DOB: 08-12-1965 Today's Date: 03/22/2022   History of Present Illness 56yof w/hx vulvar CA s/p chemo/XRT complicated by chronic R groin/inguinal open wound with known osteomyelitis presented with generalized weakness.  Patient found to have severe hyperkalemia pancytopenia lactic acidosis FOBT positive admitted to ICU. Known history of renal transplant on immuno suppression. Underwent R THA and ORIF of tibial plateau on 10/22.   Clinical Impression   [name] is a 56 year old woman with above medical history. She presents with generalized weakness, decreased activity tolerance and impaired balance resulting in a decline in baseline functional abilities. At baseline she is independent with ADLs and ambulates with a cane. 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 Currently she is requiring a rolling walker and min guard for just a couple of steps and increased assistance with LB ADLs and toileting. Patient will benefit from skilled OT services while in hospital to improve deficits and learn compensatory strategies as needed in order to return to PLOF.  Do not expect at this time that she will need OT services at discharge - however, she has a complicated medical history and pending her progress in hospital recommendations may need to be updated. Will follow.      Recommendations for follow up therapy are one component of a multi-disciplinary discharge planning process, led by the attending physician.  Recommendations may be updated based on patient status, additional functional criteria and insurance authorization.   Follow Up Recommendations  No OT follow up     Assistance Recommended at Discharge Intermittent Supervision/Assistance  Patient can return home with the following A little help with walking and/or transfers;Assistance with cooking/housework;Help with stairs or ramp for entrance;A little  help with bathing/dressing/bathroom    Functional Status Assessment  Patient has had a recent decline in their functional status and demonstrates the ability to make significant improvements in function in a reasonable and predictable amount of time.  Equipment Recommendations  None recommended by OT    Recommendations for Other Services       Precautions / Restrictions Precautions Precautions: Fall Precaution Comments: nose bleeds (low plt), Restrictions Weight Bearing Restrictions: No      Mobility Bed Mobility Overal bed mobility: Needs Assistance Bed Mobility: Supine to Sit     Supine to sit: Supervision, HOB elevated          Transfers Overall transfer level: Needs assistance   Transfers: Sit to/from Stand, Bed to chair/wheelchair/BSC Sit to Stand: Min guard     Step pivot transfers: Min guard     General transfer comment: Min guard with walker with increased time to power up to transfer to [facility]. Then min guard to take several steps to recliner with walker.      Balance Overall balance assessment: Mild deficits observed, not formally tested                                         ADL either performed or assessed with clinical judgement   ADL Overall ADL's : Needs assistance/impaired Eating/Feeding: Independent;Sitting   Grooming: Independent;Sitting   Upper Body Bathing: Independent;Sitting   Lower Body Bathing: Minimal assistance;Sit to/from stand   Upper Body Dressing : Independent   Lower Body Dressing: Sit to/from stand;Moderate assistance   Toilet Transfer: Min guard;BSC/3in1;Rolling walker (2 wheels)   Toileting- Clothing Manipulation  and Hygiene: Maximal assistance;Sit to/from stand       Functional mobility during ADLs: Min guard;Rolling walker (2 wheels)       Vision Patient Visual Report: No change from baseline       Perception     Praxis      Pertinent Vitals/Pain Pain Assessment Pain Assessment:  Faces Faces Pain Scale: Hurts little more Pain Location: LEs/buttocks Pain Descriptors / Indicators: Aching, Grimacing Pain Intervention(s): Monitored during session     Hand Dominance Right   Extremity/Trunk Assessment Upper Extremity Assessment Upper Extremity Assessment: Overall WFL for tasks assessed   Lower Extremity Assessment Lower Extremity Assessment: Defer to PT evaluation   Cervical / Trunk Assessment Cervical / Trunk Assessment: Normal   Communication Communication Communication: No difficulties   Cognition Arousal/Alertness: Awake/alert Behavior During Therapy: WFL for tasks assessed/performed Overall Cognitive Status: Within Functional Limits for tasks assessed                                       General Comments       Exercises     Shoulder Instructions      Home Living Family/patient expects to be discharged to:: Private residence Living Arrangements: Spouse/significant other Available Help at Discharge: Family;Available 24 hours/day Type of Home: House Home Access: Stairs to enter [employer] of Steps: 3-4 at front vs back Entrance Stairs-Rails: Right;Left Home Layout: Two level;Able to live on main level with bedroom/bathroom Alternate Level Stairs-Number of Steps: flight Alternate Level Stairs-Rails: Right Bathroom Shower/Tub: Tub/shower unit;Walk-in shower   Bathroom Toilet: Standard Bathroom Accessibility: Yes How Accessible: Accessible via walker Home Equipment: Standard Walker;BSC/3in1;Tub bench   Additional Comments: can stay on first floor, tub/shower w/ bench on first floor and walk in shower on second floor      Prior Functioning/Environment Prior Level of Function : Independent/Modified Independent;Working/employed;Driving             Mobility Comments: uses a cane ADLs Comments: works as a Engineer, 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 Independent wtih ADLS        OT  Problem List: Decreased activity tolerance;Impaired balance (sitting and/or standing);Pain;Decreased strength      OT Treatment/Interventions: Self-care/ADL training;Therapeutic exercise;DME and/or AE instruction;Therapeutic activities;Balance training;Patient/family education    OT Goals(Current goals can be found in the care plan section) Acute Rehab OT Goals Patient Stated Goal: get to bathroom OT Goal Formulation: With patient Time For Goal Achievement: 04/05/22 Potential to Achieve Goals: Good  OT Frequency: Min 2X/week    Co-evaluation              AM-PAC OT "6 Clicks" Daily Activity     Outcome Measure Help from another person eating meals?: None Help from another person taking care of personal grooming?: None Help from another person toileting, which includes using toliet, bedpan, or urinal?: A Lot Help from another person bathing (including washing, rinsing, drying)?: A Little Help from another person to put on and taking off regular upper body clothing?: None Help from another person to put on and taking off regular lower body clothing?: A Lot 6 Click Score: 19   End of Session Equipment Utilized During Treatment: Rolling walker (2 wheels) Nurse Communication: Mobility status  Activity Tolerance: Patient tolerated treatment well Patient left: in chair;with call bell/phone within reach;with nursing/sitter in room  OT Visit Diagnosis: Unsteadiness on feet (R26.81);Muscle weakness (generalized) (M62.81)  Time: 1110-1135 OT Time Calculation (min): 25 min Charges:  OT General Charges $OT Visit: 1 Visit OT Evaluation $OT Eval Low Complexity: 1 Low OT Treatments $Self Care/Home Management : 8-22 mins  Gustavo Lah, OTR/L [place]  Office [phone]   Lenward Chancellor 03/22/2022, 11:54 AM

## 2022-03-22 NOTE — Consult Note (Signed)
[place]  [phone] Fax:([phone]) [place]    Referral MD  Reason for Referral: Pancytopenia  Chief Complaint  Patient presents with   Emesis   Weakness   HPI:   This is a very pleasant 56 year old female patient with past medical history significant for kidney transplant, chronic osteomyelitis, DVT back when she was being treated for vulvar cancer, history of vulvar cancer back in 2021 status post chemoradiation now presented with generalized weakness. While she was in the emergency room she was found to have severe hyperkalemia, pancytopenia, lactic acidosis admitted to the ICU pancytopenia felt to be secondary to linezolid. She denies any ongoing bleeding.  She feels better than when she came to the hospital.  She is following up with urology and has plans to see orthopedic oncology in [place] for her chronic osteomyelitis.  She is on Prograf and prednisone for immunosuppression given her kidney transplant.  She has Pseudomonas bacteremia, ID on board.  She is found to have ischemic ATN likely from low oral intake, recurrent nausea and vomiting.  She is being considered for possible thyroid transplant.  She does not remember any other history of DVT other than back in 2021 when she was being treated for vulvar cancer.  Eliquis was on hold given pancytopenia. Rest of the pertinent 10 point ROS reviewed and negative.  Past Medical History:  Diagnosis Date   Complication of anesthesia    nausea and vomiting   DVT (deep venous thrombosis) (HCC)    History of osteomyelitis    Kidney transplant recipient    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[facility])   :   Past Surgical History:  Procedure Laterality Date   APPLICATION OF WOUND VAC Right 01/22/2022   Procedure: APPLICATION OF WOUND VAC;  Surgeon: Leandrew Koyanagi, MD;  Location: [place];  Service: Orthopedics;  Laterality: Right;   BIOPSY  04/24/2020   Procedure: BIOPSY;  Surgeon:  Doran Stabler, MD;  Location: [place];  Service: Gastroenterology;;   ESOPHAGOGASTRODUODENOSCOPY (EGD) WITH PROPOFOL N/A 04/24/2020   Procedure: ESOPHAGOGASTRODUODENOSCOPY (EGD) WITH PROPOFOL;  Surgeon: Doran Stabler, MD;  Location: [place];  Service: Gastroenterology;  Laterality: N/A;   IR US GUIDE BX ASP/DRAIN  04/30/2020   KIDNEY TRANSPLANT  2001   KIDNEY TRANSPLANT  2014   LEEP     ORIF TIBIA PLATEAU Right 01/22/2022   Procedure: OPEN REDUCTION INTERNAL FIXATION (ORIF) TIBIAL PLATEAU;  Surgeon: Meredith Pel, MD;  Location: [place];  Service: Orthopedics;  Laterality: Right;   TOTAL ABDOMINAL HYSTERECTOMY     ovaries left in situ   TOTAL HIP ARTHROPLASTY Right 01/22/2022   Procedure: TOTAL HIP ARTHROPLASTY ANTERIOR APPROACH;  Surgeon: Leandrew Koyanagi, MD;  Location: [place];  Service: Orthopedics;  Laterality: Right;  :   Current Facility-Administered Medications  Medication Dose Route Frequency Provider Last Rate Last Admin   0.9 %  sodium chloride infusion (Manually program via Guardrails IV Fluids)   Intravenous Once Rigoberto Noel, MD       0.9 %  sodium chloride infusion (Manually program via Guardrails IV Fluids)   Intravenous Once Anders Simmonds, MD       acetaminophen (TYLENOL) tablet 650 mg  650 mg Oral Q4H PRN Judd Lien, MD   650 mg at 03/21/22 0409   carvedilol (COREG) tablet 12.5 mg  12.5 mg Oral BID WC Kc, Maren Beach, MD   12.5 mg at 03/22/22 [phone]  Chlorhexidine Gluconate Cloth 2 % PADS 6 each  6 each Topical Daily Rigoberto Noel, MD   6 each at 03/22/22 0856   docusate sodium (COLACE) capsule 100 mg  100 mg Oral BID PRN Rigoberto Noel, MD       doxycycline (VIBRA-TABS) tablet 100 mg  100 mg Oral Q12H Laurice Record, MD   100 mg at 03/22/22 0855   hydrALAZINE (APRESOLINE) injection 5 mg  5 mg Intravenous Q6H PRN Antonieta Pert, MD   5 mg at 03/22/22 1232   HYDROcodone-acetaminophen (NORCO/VICODIN) 5-325 MG per tablet 1-2 tablet  1-2 tablet Oral Q6H  PRN Antonieta Pert, MD   2 tablet at 03/21/22 2219   hydrocortisone sodium succinate (SOLU-CORTEF) 100 MG injection 100 mg  100 mg Intravenous Daily Kara Mead V, MD   100 mg at 03/22/22 0855   lip balm (CARMEX) ointment   Topical PRN Rigoberto Noel, MD   75 Application at 75/17/00 2309   [START ON 03/23/2022] meropenem (MERREM) 500 mg in sodium chloride 0.9 % 100 mL IVPB  500 mg Intravenous Q24H Laurice Record, MD       ondansetron (ZOFRAN) injection 4 mg  4 mg Intravenous Q6H PRN Rigoberto Noel, MD       Oral care mouth rinse  15 mL Mouth Rinse 4 times per day Rigoberto Noel, MD   15 mL at 03/22/22 1249   Oral care mouth rinse  15 mL Mouth Rinse PRN Rigoberto Noel, MD       pantoprazole (PROTONIX) injection 40 mg  40 mg Intravenous Q12H Kara Mead V, MD   40 mg at 03/22/22 0855   polyethylene glycol (MIRALAX / GLYCOLAX) packet 17 g  17 g Oral Daily PRN Rigoberto Noel, MD       sodium chloride (OCEAN) 0.65 % nasal spray 1 spray  1 spray Each Nare PRN Kathryne Eriksson, NP   1 spray at 03/22/22 0541      Allergies  Allergen Reactions   Oxycodone-Acetaminophen Itching, Nausea And Vomiting, Nausea Only, Rash and Other (See Comments)    Headaches, also    Piperacillin-Tazobactam In Dex     Other Reaction(s): Other (See Comments)  Thrombocytopenia  Thrombocytopenia   Zosyn [Piperacillin Sod-Tazobactam So] Other (See Comments)    Thrombocytopenia   Hydromorphone Nausea And Vomiting, Rash and Other (See Comments)   Amlodipine Other (See Comments) and Swelling    Ankle swelling   Codeine     vomiting  :   Family History  Problem Relation Age of Onset   Cerebral aneurysm Mother    AAA (abdominal aortic aneurysm) Brother    Breast cancer Paternal Aunt    Heart attack Maternal Grandmother    Brain cancer Maternal Grandfather    Colon cancer Neg Hx    Ovarian cancer Neg Hx    Endometrial cancer Neg Hx    Pancreatic cancer Neg Hx    Prostate cancer Neg Hx   :   Social History    Socioeconomic History   Marital status: Married    Spouse name: Not on file   Number of children: Not on file   Years of education: Not on file   Highest education level: Not on file  Occupational History   Not on file  Tobacco Use   Smoking status: Never   Smokeless tobacco: Never  Vaping Use   Vaping Use: Never used  Substance and Sexual Activity   Alcohol use: Not Currently  Drug use: Never   Sexual activity: Not Currently  Other Topics Concern   Not on file  Social History Narrative   Not on file   Social Determinants of Health   Financial Resource Strain: Not on file  Food Insecurity: No Food Insecurity (03/20/2022)   Hunger Vital Sign    Worried About Running Out of Food in the Last Year: Never true    Ran Out of Food in the Last Year: Never true  Transportation Needs: No Transportation Needs (03/20/2022)   PRAPARE - Hydrologist (Medical): No    Lack of Transportation (Non-Medical): No  Physical Activity: Not on file  Stress: Not on file  Social Connections: Not on file  Intimate Partner Violence: Not At Risk (03/20/2022)   Humiliation, Afraid, Rape, and Kick questionnaire    Fear of Current or Ex-Partner: No    Emotionally Abused: No    Physically Abused: No    Sexually Abused: No   Exam: Patient Vitals for the past 24 hrs:  BP Temp Temp src Pulse Resp SpO2  03/22/22 1400 (!) 146/82 -- -- 85 (!) 22 98 %  03/22/22 1346 -- -- -- 85 (!) 24 99 %  03/22/22 1341 (!) 149/88 -- -- -- -- --  03/22/22 1243 -- -- -- -- (!) 22 --  03/22/22 1240 -- -- -- 89 -- 91 %  03/22/22 1229 (!) 175/92 -- -- 89 14 100 %  03/22/22 1200 -- 97.6 F (36.4 C) Axillary -- -- --  03/22/22 1100 -- -- -- 78 (!) 21 98 %  03/22/22 1000 -- -- -- 88 17 99 %  03/22/22 0800 (!) 182/108 98.2 F (36.8 C) Oral 92 20 100 %  03/22/22 0400 125/77 97.7 F (36.5 C) Oral 75 17 99 %  03/22/22 0200 125/75 -- -- 73 19 98 %  03/22/22 0100 133/85 -- -- 67 18 99 %   03/22/22 0000 (!) 140/88 97.8 F (36.6 C) Oral 79 19 99 %  03/21/22 2300 (!) 141/89 -- -- 82 19 99 %  03/21/22 2200 (!) 153/88 -- -- 74 18 100 %  03/21/22 2100 (!) 160/97 -- -- 87 (!) 21 100 %  03/21/22 2000 (!) 143/95 -- -- 80 19 100 %  03/21/22 1950 (!) 156/92 -- -- 87 (!) 22 100 %  03/21/22 1917 -- 97.9 F (36.6 C) Oral -- -- --  03/21/22 1900 (!) 168/95 -- -- 86 (!) 22 100 %  03/21/22 1800 (!) 179/106 -- -- 78 (!) 24 100 %  03/21/22 1700 (!) 173/105 -- -- 75 19 99 %  03/21/22 1600 (!) 174/103 -- -- 71 20 100 %    Physical Exam Constitutional:      Appearance: Normal appearance.  Cardiovascular:     Rate and Rhythm: Normal rate and regular rhythm.  Pulmonary:     Effort: Pulmonary effort is normal.     Breath sounds: Normal breath sounds.  Abdominal:     General: Abdomen is flat.     Palpations: Abdomen is soft.  Musculoskeletal:        General: No swelling.     Cervical back: Normal range of motion and neck supple. No rigidity.  Lymphadenopathy:     Cervical: No cervical adenopathy.  Skin:    General: Skin is warm and dry.  Neurological:     General: No focal deficit present.     Mental Status: She is alert.  Psychiatric:  Mood and Affect: Mood normal.        Lab Results  Component Value Date   WBC 1.2 (LL) 03/22/2022   HGB 7.7 (L) 03/22/2022   HCT 23.6 (L) 03/22/2022   PLT 18 (LL) 03/22/2022   GLUCOSE 124 (H) 03/22/2022   ALT 9 03/19/2022   AST 22 03/19/2022   NA 139 03/22/2022   K 3.3 (L) 03/22/2022   CL 111 03/22/2022   CREATININE 7.83 (H) 03/22/2022   BUN 98 (H) 03/22/2022   CO2 14 (L) 03/22/2022    US PELVIS (TRANSABDOMINAL ONLY)  Result Date: 03/21/2022 CLINICAL DATA:  56 year old woman with history of vulvar carcinoma and chronic pelvic osteomyelitis. Chronic thickening of right labial soft tissues. EXAM: LIMITED ULTRASOUND OF PELVIS TECHNIQUE: Limited transabdominal ultrasound examination of the pelvis was performed. COMPARISON:  CT  abdomen pelvis 03/20/2022 FINDINGS: Targeted sonographic evaluation of the area of concern in the right labial region demonstrates soft tissue thickening without discrete fluid collection. IMPRESSION: Sonographic evaluation of the area of concern in the right labia demonstrates soft tissue thickening similar to prior CT scans dating back to 04/03/2020. This may be due to chronic inflammation or postsurgical change. No discrete fluid collection identified to indicate abscess. Electronically Signed   By: Miachel Roux M.D.   On: 03/21/2022 14:08   CT ABDOMEN PELVIS WO CONTRAST  Result Date: 03/20/2022 CLINICAL DATA:  Pelvic osteomyelitis. EXAM: CT ABDOMEN AND PELVIS WITHOUT CONTRAST TECHNIQUE: Multidetector CT imaging of the abdomen and pelvis was performed following the standard protocol without IV contrast. RADIATION DOSE REDUCTION: This exam was performed according to the departmental dose-optimization program which includes automated exposure control, adjustment of the mA and/or kV according to patient size and/or use of iterative reconstruction technique. COMPARISON:  PET-CT dated 03/09/2022. CT abdomen and pelvis dated 04/03/2020. FINDINGS: Lower chest: No acute abnormality. Hepatobiliary: No focal liver abnormality is seen. Gallbladder is distended but otherwise unremarkable. No bile duct dilatation is seen. Pancreas: Unremarkable. No pancreatic ductal dilatation or surrounding inflammatory changes. Spleen: Normal in size without focal abnormality. Adrenals/Urinary Tract: Native kidneys are atrophic. 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 Additional hydronephrosis of the somewhat atrophic-appearing transplant kidney in the LEFT pelvis. Bladder is moderately distended but otherwise unremarkable. Stomach/Bowel: No dilated large or small bowel loops are seen within the abdomen or pelvis. Stomach is unremarkable. Vascular/Lymphatic: No abdominal aortic  aneurysm. Asymmetrically prominent LEFT common iliac vein, of uncertain significance. No enlarged lymph nodes are identified within the abdomen or pelvis. Reproductive: Presumed hysterectomy.  No adnexal mass or free fluid. Other: Asymmetric soft tissue thickening involving the upper RIGHT perineum, incompletely imaged, with soft tissue wound underlying the RIGHT inferior pubic ramus. Destructive/lytic changes of the bilateral symphysis pubis and RIGHT inferior pubic ramus (discontinuity of the mid/posterior portion of the RIGHT inferior pubic ramus), corresponding to patient's known pathologic process in this area and the previously imaging reports describing a chronic open wound of the RIGHT perineum. However, there is increased destruction/discontinuity of the RIGHT inferior pubic ramus compared to the earlier PET-CT of 07/20/2021 indicating some progression. Symphysis pubis is stable. Musculoskeletal: Also, chronic pathologic-appearing fracture deformity at the lateral aspect of the RIGHT superior pubic ramus/anterior acetabulum. The adjacent RIGHT hip prosthesis appears intact and stable in position. Osseous structures of the abdomen are unremarkable. IMPRESSION: 1. Asymmetric soft tissue thickening involving the upper RIGHT perineum, incompletely imaged, with chronic open soft tissue wound underlying the RIGHT inferior pubic ramus.  Associated destructive/lytic changes of the RIGHT inferior pubic ramus (discontinuity of the mid/posterior portion of the RIGHT inferior pubic ramus), corresponding to patient's known pathologic process in this area. However, there is increased destruction/discontinuity of the RIGHT inferior pubic ramus compared to the earlier PET-CT of 07/20/2021 indicating some degree of progression. 2. Chronic pathologic-appearing fracture deformity at the lateral aspect of the RIGHT superior pubic ramus/anterior acetabulum. The adjacent RIGHT hip prosthesis appears intact and stable in position.  3. 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 Additional hydronephrosis of the somewhat atrophic-appearing transplant kidney in the LEFT pelvis. Bladder is distended, suggesting associated bladder outlet obstruction. Electronically Signed   By: Franki Cabot M.D.   On: 03/20/2022 14:50   US Renal Transplant w/Doppler  Result Date: 03/19/2022 CLINICAL DATA:  Acute on chronic renal failure EXAM: ULTRASOUND OF RENAL TRANSPLANT WITH RENAL DOPPLER ULTRASOUND TECHNIQUE: Ultrasound examination of the renal transplant was performed with gray-scale, color and duplex doppler evaluation. COMPARISON:  None Available. FINDINGS: Transplant kidney location: RLQ Transplant Kidney: Renal measurements: 11.2 x 5.3 x 6.4 cm = volume: 268m. Normal in size and parenchymal echogenicity. Moderate hydronephrosis. No peri-transplant fluid collection seen. Color flow in the main renal artery:  Yes Color flow in the main renal vein:  Yes Duplex Doppler Evaluation: Main Renal Artery Velocity: 27.7 cm/sec Main Renal Artery Resistive Index: 0.7 Venous waveform in main renal vein:  Present Intrarenal resistive index in upper pole:  0.5 (normal 0.6-0.8; equivocal 0.8-0.9; abnormal >= 0.9) Intrarenal resistive index in lower pole: 0.5 (normal 0.6-0.8; equivocal 0.8-0.9; abnormal >= 0.9) Bladder: Normal for degree of bladder distention. Other findings:  None. IMPRESSION: Right lower quadrant renal transplant with moderate hydronephrosis. Patent transplant vasculature with normal resistive indices. Electronically Signed   By: SJulian HyM.D.   On: 03/19/2022 23:07   DG Chest 2 View  Result Date: 03/19/2022 CLINICAL DATA:  Dyspnea EXAM: CHEST - 2 VIEW COMPARISON:  Chest x-ray January 19, 2022 FINDINGS: The cardiomediastinal silhouette is unchanged in contour. No focal pulmonary opacity. No pleural effusion or pneumothorax. The visualized upper abdomen is unremarkable. No acute  osseous abnormality. IMPRESSION: No acute cardiopulmonary abnormality. Electronically Signed   By: MBeryle FlockM.D.   On: 03/19/2022 12:30   VAS [place]OWER EXTREMITY VENOUS (DVT)  Result Date: 03/13/2022  Lower Venous DVT Study Patient Name:  KSHAQUALA BROEKER Date of Exam:   03/13/2022 Medical Rec #: [id number]    Accession #:    [id number] of Birth: 1November 01, 1967   Patient Gender: F Patient Age:   89years [facility]:      VAS [place] EXTREMITY VENOUS (DVT) Referring Phys: KJeral Pinch--------------------------------------------------------------------------------  Indications: Swelling.  Risk Factors: Cancer History of vulvar cancer DVT right femoral vein 2021 at [facility] History of kidney transplant. Anticoagulation: Eliquis. Limitations: Surgical scarring after lymph node removal. [facility] right                   proximal to mid femoral veins Performing Technologist: CSharion DoveRVS  Examination Guidelines: A complete evaluation includes B-mode imaging, spectral Doppler, color Doppler, and power Doppler as needed of all accessible portions of each vessel. Bilateral testing is considered an integral part of a complete examination. Limited examinations for reoccurring indications may be performed as noted. The reflux portion of the exam  is performed with the patient in reverse Trendelenburg.  +---------+---------------+---------+-----------+----------+--------------+ RIGHT    CompressibilityPhasicitySpontaneityPropertiesThrombus Aging +---------+---------------+---------+-----------+----------+--------------+ CFV      Full           Yes      Yes                                 +---------+---------------+---------+-----------+----------+--------------+ SFJ      Full                                                         +---------+---------------+---------+-----------+----------+--------------+ FV Prox  Full                                                        +---------+---------------+---------+-----------+----------+--------------+ FV Mid   Full                                                        +---------+---------------+---------+-----------+----------+--------------+ FV DistalFull                                                        +---------+---------------+---------+-----------+----------+--------------+ PFV      Full                                                        +---------+---------------+---------+-----------+----------+--------------+ POP      Full           Yes      Yes                                 +---------+---------------+---------+-----------+----------+--------------+ PTV      Full                                                        +---------+---------------+---------+-----------+----------+--------------+ PERO     Full                                                        +---------+---------------+---------+-----------+----------+--------------+   +----+---------------+---------+-----------+----------+--------------+ LEFTCompressibilityPhasicitySpontaneityPropertiesThrombus Aging +----+---------------+---------+-----------+----------+--------------+ CFV Full           Yes      Yes                                 +----+---------------+---------+-----------+----------+--------------+  Summary: RIGHT: - No evidence of deep vein thrombosis in the lower extremity. No indirect evidence of obstruction proximal to the inguinal ligament. - No cystic structure found in the popliteal fossa.  LEFT: - No evidence of common femoral vein obstruction.  *See table(s) above for measurements and observations. Electronically signed by Monica Martinez MD on 03/13/2022 at 1:51:31 PM.    Final    NM PET Image Restage (PS) Skull Base to Thigh  (F-18 FDG)  Result Date: 03/10/2022 CLINICAL DATA:  Subsequent treatment strategy for vulvar carcinoma. EXAM: NUCLEAR MEDICINE PET SKULL BASE TO THIGH TECHNIQUE: 6.89 mCi F-18 FDG was injected intravenously. Full-ring PET imaging was performed from the skull base to thigh after the radiotracer. CT data was obtained and used for attenuation correction and anatomic localization. Fasting blood glucose: 93 mg/dl COMPARISON:  Multiple prior studies most recent comparison with PET 07/20/2021 FINDINGS: Mediastinal blood pool activity: SUV max 2.53 Liver activity: SUV max NA NECK: No hypermetabolic lymph nodes in the neck. Incidental CT findings: None CHEST: No hypermetabolic mediastinal or hilar nodes. No suspicious pulmonary nodules on the CT scan. Incidental CT findings: Coronary artery calcifications of LEFT anterior descending coronary artery and RIGHT coronary artery. Normal heart size. No pericardial effusion. Otherwise unremarkable appearance of cardiac structures. Limited assessment of cardiovascular structures given lack of intravenous contrast. Lungs are clear and airways are patent. No adenopathy by size criteria in the chest. ABDOMEN/PELVIS: RIGHT groin uptake of FDG increasing over time (image 185/4) fullness of soft tissues in this area with a maximum SUV of 6.67 as compared to 4.94 areas approximately double the size as it was previously measuring up to approximately 3.6 cm based on FDG uptake. This finding is confounded by the presence of known chronic osteomyelitis and large defect, previously associated with infection in this area. Better visualization of adjacent pubic root fracture on the current study with frank bony destruction now of the inferior pubic ramus. No solid organ disease. No gross focal uptake to suggest recurrence in the site of resection though there is peripheral uptake that is indeterminate. Uptake about the anal canal potentially physiologic but in close proximity to previously  resected vulvar primary. Incidental CT findings: Marked atrophy of the native kidneys. Post RIGHT lower quadrant renal transplant. Small amount of excreted urine is in the urinary bladder. Amount of uptake and or excretion of FDG by the RIGHT kidney is perhaps less than expected though is not well assessed. No adenopathy by size criteria in the retroperitoneum. No acute gastrointestinal findings. Aortic atherosclerosis. Failed LEFT lower quadrant renal transplant. SKELETON: Continued destruction of the RIGHT pubic bone. Areas of sclerotic mottled changes about the symphysis pubis. No visible focal fluid collection in this area. Post RIGHT hip arthroplasty. Edema and obvious expansion of the RIGHT upper thigh. Increased metabolic activity about RIGHT upper thigh presumably related to the tract of hip arthroplasty placement. Incidental CT findings: None. IMPRESSION: 1. No signs of distant metastatic disease. 2. Progressive changes of osteomyelitis and signs of pubic bone fracture on the RIGHT. 3. Increasing metabolic activity of the RIGHT groin could relate to worsening inflammation in this area, focal nature does not allow for exclusion of metastatic process. Would suggest focused ultrasound with sampling as warranted. 4. No gross focal uptake to suggest recurrence in the site of resection though there is peripheral uptake that is indeterminate. 5. Gross edema throughout the lower extremity on the RIGHT with expansion of the upper RIGHT thigh. Correlate with any signs of impaired venous  drainage. Given the marked asymmetry now nearly 6 weeks following arthroplasty would suggest RIGHT lower extremity venous Doppler to exclude DVT. 6. Anal canal uptake potentially physiologic. Given patient history and proximity to tumor considered correlation with physical exam. 7. Post RIGHT lower quadrant renal transplant. Amount of uptake and or excretion of FDG by the RIGHT kidney is perhaps less than expected though is not well  assessed. Correlate with renal function. 8. Aortic atherosclerosis and coronary artery disease. 9. Post RIGHT hip arthroplasty with increased metabolic activity potentially related to recent surgery. It would be difficult to exclude the possibility of secondary infection of this area in the setting of chronic open wound and worsening sequela of osteomyelitis. Aortic Atherosclerosis (ICD10-I70.0). These results will be called to the ordering clinician or representative by the Radiologist Assistant, and communication documented in the PACS or [employer]. Electronically Signed   By: Zetta Bills M.D.   On: 03/10/2022 12:57   XR Knee 1-2 Views Right  Result Date: 03/05/2022 2 view x-rays of the right knee show stable plate and screw fixation of a nondisplaced tibial plateau fracture.  XR HIP UNILAT W OR W/O PELVIS 2-3 VIEWS RIGHT  Result Date: 03/03/2022 X-rays of the right hip show stable total hip replacement in good position without any complications.    US PELVIS (TRANSABDOMINAL ONLY)  Result Date: 03/21/2022 CLINICAL DATA:  56 year old woman with history of vulvar carcinoma and chronic pelvic osteomyelitis. Chronic thickening of right labial soft tissues. EXAM: LIMITED ULTRASOUND OF PELVIS TECHNIQUE: Limited transabdominal ultrasound examination of the pelvis was performed. COMPARISON:  CT abdomen pelvis 03/20/2022 FINDINGS: Targeted sonographic evaluation of the area of concern in the right labial region demonstrates soft tissue thickening without discrete fluid collection. IMPRESSION: Sonographic evaluation of the area of concern in the right labia demonstrates soft tissue thickening similar to prior CT scans dating back to 04/03/2020. This may be due to chronic inflammation or postsurgical change. No discrete fluid collection identified to indicate abscess. Electronically Signed   By: Miachel Roux M.D.   On: 03/21/2022 14:08   CT ABDOMEN PELVIS WO CONTRAST  Result Date:  03/20/2022 CLINICAL DATA:  Pelvic osteomyelitis. EXAM: CT ABDOMEN AND PELVIS WITHOUT CONTRAST TECHNIQUE: Multidetector CT imaging of the abdomen and pelvis was performed following the standard protocol without IV contrast. RADIATION DOSE REDUCTION: This exam was performed according to the departmental dose-optimization program which includes automated exposure control, adjustment of the mA and/or kV according to patient size and/or use of iterative reconstruction technique. COMPARISON:  PET-CT dated 03/09/2022. CT abdomen and pelvis dated 04/03/2020. FINDINGS: Lower chest: No acute abnormality. Hepatobiliary: No focal liver abnormality is seen. Gallbladder is distended but otherwise unremarkable. No bile duct dilatation is seen. Pancreas: Unremarkable. No pancreatic ductal dilatation or surrounding inflammatory changes. Spleen: Normal in size without focal abnormality. Adrenals/Urinary Tract: Native kidneys are atrophic. 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 Additional hydronephrosis of the somewhat atrophic-appearing transplant kidney in the LEFT pelvis. Bladder is moderately distended but otherwise unremarkable. Stomach/Bowel: No dilated large or small bowel loops are seen within the abdomen or pelvis. Stomach is unremarkable. Vascular/Lymphatic: No abdominal aortic aneurysm. Asymmetrically prominent LEFT common iliac vein, of uncertain significance. No enlarged lymph nodes are identified within the abdomen or pelvis. Reproductive: Presumed hysterectomy.  No adnexal mass or free fluid. Other: Asymmetric soft tissue thickening involving the upper RIGHT perineum, incompletely imaged, with soft tissue wound underlying the RIGHT inferior pubic ramus. Destructive/lytic changes  of the bilateral symphysis pubis and RIGHT inferior pubic ramus (discontinuity of the mid/posterior portion of the RIGHT inferior pubic ramus), corresponding to patient's known pathologic  process in this area and the previously imaging reports describing a chronic open wound of the RIGHT perineum. However, there is increased destruction/discontinuity of the RIGHT inferior pubic ramus compared to the earlier PET-CT of 07/20/2021 indicating some progression. Symphysis pubis is stable. Musculoskeletal: Also, chronic pathologic-appearing fracture deformity at the lateral aspect of the RIGHT superior pubic ramus/anterior acetabulum. The adjacent RIGHT hip prosthesis appears intact and stable in position. Osseous structures of the abdomen are unremarkable. IMPRESSION: 1. Asymmetric soft tissue thickening involving the upper RIGHT perineum, incompletely imaged, with chronic open soft tissue wound underlying the RIGHT inferior pubic ramus. Associated destructive/lytic changes of the RIGHT inferior pubic ramus (discontinuity of the mid/posterior portion of the RIGHT inferior pubic ramus), corresponding to patient's known pathologic process in this area. However, there is increased destruction/discontinuity of the RIGHT inferior pubic ramus compared to the earlier PET-CT of 07/20/2021 indicating some degree of progression. 2. Chronic pathologic-appearing fracture deformity at the lateral aspect of the RIGHT superior pubic ramus/anterior acetabulum. The adjacent RIGHT hip prosthesis appears intact and stable in position. 3. 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 Additional hydronephrosis of the somewhat atrophic-appearing transplant kidney in the LEFT pelvis. Bladder is distended, suggesting associated bladder outlet obstruction. Electronically Signed   By: Franki Cabot M.D.   On: 03/20/2022 14:50   US Renal Transplant w/Doppler  Result Date: 03/19/2022 CLINICAL DATA:  Acute on chronic renal failure EXAM: ULTRASOUND OF RENAL TRANSPLANT WITH RENAL DOPPLER ULTRASOUND TECHNIQUE: Ultrasound examination of the renal transplant was performed with  gray-scale, color and duplex doppler evaluation. COMPARISON:  None Available. FINDINGS: Transplant kidney location: RLQ Transplant Kidney: Renal measurements: 11.2 x 5.3 x 6.4 cm = volume: 218m. Normal in size and parenchymal echogenicity. Moderate hydronephrosis. No peri-transplant fluid collection seen. Color flow in the main renal artery:  Yes Color flow in the main renal vein:  Yes Duplex Doppler Evaluation: Main Renal Artery Velocity: 27.7 cm/sec Main Renal Artery Resistive Index: 0.7 Venous waveform in main renal vein:  Present Intrarenal resistive index in upper pole:  0.5 (normal 0.6-0.8; equivocal 0.8-0.9; abnormal >= 0.9) Intrarenal resistive index in lower pole: 0.5 (normal 0.6-0.8; equivocal 0.8-0.9; abnormal >= 0.9) Bladder: Normal for degree of bladder distention. Other findings:  None. IMPRESSION: Right lower quadrant renal transplant with moderate hydronephrosis. Patent transplant vasculature with normal resistive indices. Electronically Signed   By: SJulian HyM.D.   On: 03/19/2022 23:07   DG Chest 2 View  Result Date: 03/19/2022 CLINICAL DATA:  Dyspnea EXAM: CHEST - 2 VIEW COMPARISON:  Chest x-ray January 19, 2022 FINDINGS: The cardiomediastinal silhouette is unchanged in contour. No focal pulmonary opacity. No pleural effusion or pneumothorax. The visualized upper abdomen is unremarkable. No acute osseous abnormality. IMPRESSION: No acute cardiopulmonary abnormality. Electronically Signed   By: MBeryle FlockM.D.   On: 03/19/2022 12:30   VAS [place]OWER EXTREMITY VENOUS (DVT)  Result Date: 03/13/2022  Lower Venous DVT Study Patient Name:  KRILLIE RIFFEL Date of Exam:   03/13/2022 Medical Rec #: [id number]    Accession #:    [id number] of Birth: [birth year]   Patient Gender: F Patient Age:   89years [facility]      VAS [place]OWER EXTREMITY VENOUS (DVT) Referring  Phys: Jeral Pinch  --------------------------------------------------------------------------------  Indications: Swelling.  Risk Factors: Cancer History of vulvar cancer DVT right femoral vein 2021 at [facility] History of kidney transplant. Anticoagulation: Eliquis. Limitations: Surgical scarring after lymph node removal. Comparison Study: Prior study done at [facility] in 2021 indicating DVT in the right                   proximal to mid femoral veins Performing Technologist: Sharion Dove RVS  Examination Guidelines: A complete evaluation includes B-mode imaging, spectral Doppler, color Doppler, and power Doppler as needed of all accessible portions of each vessel. Bilateral testing is considered an integral part of a complete examination. Limited examinations for reoccurring indications may be performed as noted. The reflux portion of the exam is performed with the patient in reverse Trendelenburg.  +---------+---------------+---------+-----------+----------+--------------+ RIGHT    CompressibilityPhasicitySpontaneityPropertiesThrombus Aging +---------+---------------+---------+-----------+----------+--------------+ CFV      Full           Yes      Yes                                 +---------+---------------+---------+-----------+----------+--------------+ SFJ      Full                                                        +---------+---------------+---------+-----------+----------+--------------+ FV Prox  Full                                                        +---------+---------------+---------+-----------+----------+--------------+ FV Mid   Full                                                        +---------+---------------+---------+-----------+----------+--------------+ FV DistalFull                                                        +---------+---------------+---------+-----------+----------+--------------+ PFV      Full                                                         +---------+---------------+---------+-----------+----------+--------------+ POP      Full           Yes      Yes                                 +---------+---------------+---------+-----------+----------+--------------+ PTV      Full                                                        +---------+---------------+---------+-----------+----------+--------------+  PERO     Full                                                        +---------+---------------+---------+-----------+----------+--------------+   +----+---------------+---------+-----------+----------+--------------+ LEFTCompressibilityPhasicitySpontaneityPropertiesThrombus Aging +----+---------------+---------+-----------+----------+--------------+ CFV Full           Yes      Yes                                 +----+---------------+---------+-----------+----------+--------------+     Summary: RIGHT: - No evidence of deep vein thrombosis in the lower extremity. No indirect evidence of obstruction proximal to the inguinal ligament. - No cystic structure found in the popliteal fossa.  LEFT: - No evidence of common femoral vein obstruction.  *See table(s) above for measurements and observations. Electronically signed by Monica Martinez MD on 03/13/2022 at 1:51:31 PM.    Final    NM PET Image Restage (PS) Skull Base to Thigh (F-18 FDG)  Result Date: 03/10/2022 CLINICAL DATA:  Subsequent treatment strategy for vulvar carcinoma. EXAM: NUCLEAR MEDICINE PET SKULL BASE TO THIGH TECHNIQUE: 6.89 mCi F-18 FDG was injected intravenously. Full-ring PET imaging was performed from the skull base to thigh after the radiotracer. CT data was obtained and used for attenuation correction and anatomic localization. Fasting blood glucose: 93 mg/dl COMPARISON:  Multiple prior studies most recent comparison with PET 07/20/2021 FINDINGS: Mediastinal blood pool activity: SUV max 2.53 Liver activity: SUV max NA NECK: No hypermetabolic  lymph nodes in the neck. Incidental CT findings: None CHEST: No hypermetabolic mediastinal or hilar nodes. No suspicious pulmonary nodules on the CT scan. Incidental CT findings: Coronary artery calcifications of LEFT anterior descending coronary artery and RIGHT coronary artery. Normal heart size. No pericardial effusion. Otherwise unremarkable appearance of cardiac structures. Limited assessment of cardiovascular structures given lack of intravenous contrast. Lungs are clear and airways are patent. No adenopathy by size criteria in the chest. ABDOMEN/PELVIS: RIGHT groin uptake of FDG increasing over time (image 185/4) fullness of soft tissues in this area with a maximum SUV of 6.67 as compared to 4.94 areas approximately double the size as it was previously measuring up to approximately 3.6 cm based on FDG uptake. This finding is confounded by the presence of known chronic osteomyelitis and large defect, previously associated with infection in this area. Better visualization of adjacent pubic root fracture on the current study with frank bony destruction now of the inferior pubic ramus. No solid organ disease. No gross focal uptake to suggest recurrence in the site of resection though there is peripheral uptake that is indeterminate. Uptake about the anal canal potentially physiologic but in close proximity to previously resected vulvar primary. Incidental CT findings: Marked atrophy of the native kidneys. Post RIGHT lower quadrant renal transplant. Small amount of excreted urine is in the urinary bladder. Amount of uptake and or excretion of FDG by the RIGHT kidney is perhaps less than expected though is not well assessed. No adenopathy by size criteria in the retroperitoneum. No acute gastrointestinal findings. Aortic atherosclerosis. Failed LEFT lower quadrant renal transplant. SKELETON: Continued destruction of the RIGHT pubic bone. Areas of sclerotic mottled changes about the symphysis pubis. No visible focal  fluid collection in this area. Post RIGHT hip arthroplasty. Edema and obvious expansion of the  RIGHT upper thigh. Increased metabolic activity about RIGHT upper thigh presumably related to the tract of hip arthroplasty placement. Incidental CT findings: None. IMPRESSION: 1. No signs of distant metastatic disease. 2. Progressive changes of osteomyelitis and signs of pubic bone fracture on the RIGHT. 3. Increasing metabolic activity of the RIGHT groin could relate to worsening inflammation in this area, focal nature does not allow for exclusion of metastatic process. Would suggest focused ultrasound with sampling as warranted. 4. No gross focal uptake to suggest recurrence in the site of resection though there is peripheral uptake that is indeterminate. 5. Gross edema throughout the lower extremity on the RIGHT with expansion of the upper RIGHT thigh. Correlate with any signs of impaired venous drainage. Given the marked asymmetry now nearly 6 weeks following arthroplasty would suggest RIGHT lower extremity venous Doppler to exclude DVT. 6. Anal canal uptake potentially physiologic. Given patient history and proximity to tumor considered correlation with physical exam. 7. Post RIGHT lower quadrant renal transplant. Amount of uptake and or excretion of FDG by the RIGHT kidney is perhaps less than expected though is not well assessed. Correlate with renal function. 8. Aortic atherosclerosis and coronary artery disease. 9. Post RIGHT hip arthroplasty with increased metabolic activity potentially related to recent surgery. It would be difficult to exclude the possibility of secondary infection of this area in the setting of chronic open wound and worsening sequela of osteomyelitis. Aortic Atherosclerosis (ICD10-I70.0). These results will be called to the ordering clinician or representative by the Radiologist Assistant, and communication documented in the PACS or [employer]. Electronically Signed   By: Zetta Bills M.D.   On: 03/10/2022 12:57   XR Knee 1-2 Views Right  Result Date: 03/05/2022 2 view x-rays of the right knee show stable plate and screw fixation of a nondisplaced tibial plateau fracture.  XR HIP UNILAT W OR W/O PELVIS 2-3 VIEWS RIGHT  Result Date: 03/03/2022 X-rays of the right hip show stable total hip replacement in good position without any complications.   Assessment and Plan:   This is a very pleasant 56 year old female patient with past medical history significant for kidney transplant twice, vulvar cancer status post chemoradiation in 2021, chronic osteomyelitis on linezolid admitted with generalized weakness.  She was found to be hyperkalemic and AKI on CKD and was admitted for management of AKI as well as sepsis.  ID is on board and is recommending meropenem and doxycycline.  Her pancytopenia is likely related to the ongoing infection as well as possibly from linezolid use ( this is very rare) .  We should consider transfusing to keep her packed red blood cells around 7.5-8 g/dL and platelet count around 20,000.  There is no evidence of acute bleed at this time.  No evidence of DIC.  We will order some coag labs to rule out possibilities for pancytopenia including DIC. I don't believe there is clear evidence of hemolysis, no encephalopathy, hence less concerning for TTP. We will continue to monitor her periodically.  The length of time of the face-to-face encounter was 55 minutes. More than 50% of time was spent counseling and coordination of care.  Thank you for this referral.

## 2022-03-22 NOTE — Progress Notes (Addendum)
[facility]  Date of Admission:  03/19/2022   Total days of inpatient antibiotics 2  Principal Problem:   AKI (acute kidney injury) ([place]) Active Problems:   Pancytopenia (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  Maintained on linezolid and moxifloxacin admitted for severe sepsis and pancytopenia.  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  Patient has had multiple courses of antibiotics in the past.  ID visit on 9/19 she had completed prolonged  courses(12 weeks) of antibiotics including linezolid  and moxifloxacin, previously Levaquin, Doxy and tedizolid.  Noted that she had refused PICC lines in the past.  Plans was for her to see Dr. Sharol Given for surgical management and bone biopsy, sent for possible 16 mecillinam cultures. -Taken to the OR on 01/22/2022 as imaging showed subcapital fracture of the right femur and large lipohemarthrosis of right knee.  Patient underwent right total hip arthroplasty.  No cultures obtained.  She was then followed by orthopedics outpatient - Seen on 11/3 for postop visit with orthopedics started on linezolid and moxifloxacin.  Plan was to continue antibiotics till she can be seen by orthopedic oncologist Dr. Percell Miller at [facility] for further management for pelvic osteomyelitis. - Patient notes that she needs to see neurology for she can be managed by orthopedics oncology. -PET/CT on 12/8 showed worsening inflammation around the right groin, focal in nature suggest ultrasound.   Progressive changes of osteomyelitis pubic bone fracture on the right. -CT showed increased x-ray from/discontinuity of right anterior pelvic wall mass comparator earlier CT on 07/20/2021.  Chronic open soft tissue wound underlying right inferior pubic ramus with asymmetric soft tissue thickening incompletely imaged.  Right hip prosthesis is intact and stable position.  Hydronephrosis of the transplant kidney in the right lower pelvis.  Hydronephrosis of left kidney.  Bladder distended with possible bladder outlet obstruction. Pelvic U/S to evaluate groin wound showed chronic inflammation Recommendations:  -D/C cefepime(Cefepime MIC 8, discussed with micro->sending to [facility], R cipro)  -Start merrem -Started doxycycline(suspect will need a suppressive regimen as HWR in place) -I reached out to Radiology (Dr. Enriqueta Shutter) in regards to c/f prosthesis involvement with pelvic infection given proximity. Recommend NM study for further characterization. NM wbc ordered. Given PsA is R cipro , we have no PO suppressive abx options if prostheiss is infected. I spoke with Pt today she is amenable to study -Engage ortho for possible PJI   #Hx of renal transplant x2 #Bladder outlet obstruction -Follows with transplant -Urology engaged  Microbiology:   Antibiotics: Vnaomcycin and cefepime 12/17- Cultures: Blood 12/17 1/2 PSA  Urine 12/17-insignificant growth SUBJECTIVE: Sitting , discussed imaging  Interval: Afebrile overnight  Review of Systems: Review of Systems  All other systems reviewed and are negative.    Scheduled Meds:  sodium chloride   Intravenous Once   sodium chloride   Intravenous Once   carvedilol  12.5 mg Oral BID WC   Chlorhexidine Gluconate Cloth  6 each Topical Daily   doxycycline  100 mg Oral Q12H   hydrocortisone sodium succinate  100 mg Intravenous Daily   mouth rinse  15 mL Mouth Rinse 4 times per day   pantoprazole (PROTONIX) IV  40 mg Intravenous Q12H   Continuous  Infusions:  ceFEPime (MAXIPIME) IV Stopped (03/21/22 1657)   PRN Meds:.acetaminophen, docusate sodium, hydrALAZINE, HYDROcodone-acetaminophen, lip balm, ondansetron (ZOFRAN) IV, mouth rinse, polyethylene glycol, sodium chloride Allergies  Allergen Reactions   Oxycodone-Acetaminophen Itching, Nausea And Vomiting, Nausea Only, Rash and Other (See Comments)    Headaches, also    Piperacillin-Tazobactam In Dex     Other Reaction(s): Other (See Comments)  Thrombocytopenia  Thrombocytopenia   Zosyn [Piperacillin Sod-Tazobactam So] Other (See Comments)    Thrombocytopenia   Hydromorphone Nausea And Vomiting, Rash and Other (See Comments)   Amlodipine Other (See Comments) and Swelling    Ankle swelling   Codeine     vomiting    OBJECTIVE: Vitals:   03/22/22 0100 03/22/22 0200 03/22/22 0400 03/22/22 0800  BP: 133/85 125/75 125/77 (!) 182/108  Pulse: 67 73 75 92  Resp: '18 19 17 20  '$ Temp:   97.7 F (36.5 C) 98.2 F (36.8 C)  TempSrc:   Oral Oral  SpO2: 99% 98% 99% 100%  Weight:      Height:       Body mass index is 22.53 kg/m.  Physical Exam Constitutional:      Appearance: Normal appearance.  HENT:     Head: Normocephalic and atraumatic.     Right Ear: Tympanic membrane normal.     Left Ear: Tympanic membrane normal.     Nose: Nose normal.     Mouth/Throat:     Mouth: Mucous membranes are moist.  Eyes:     Extraocular Movements: Extraocular movements intact.     Conjunctiva/sclera: Conjunctivae normal.     Pupils: Pupils are equal, round, and reactive to light.  Cardiovascular:     Rate and Rhythm: Normal rate and regular rhythm.     Heart sounds: No murmur heard.    No friction rub. No gallop.  Pulmonary:     Effort: Pulmonary effort is normal.     Breath sounds: Normal breath sounds.  Abdominal:     General: Abdomen is flat.     Palpations: Abdomen is soft.  Musculoskeletal:     Comments: Groin wound  Skin:    General: Skin is warm and dry.  Neurological:      General: No focal deficit present.     Mental Status: She is alert and oriented to person, place, and time.  Psychiatric:        Mood and Affect: Mood normal.       Lab Results Lab Results  Component Value Date   WBC 1.2 (LL) 03/22/2022   HGB 7.7 (L) 03/22/2022   HCT 23.6 (L) 03/22/2022   MCV 92.5 03/22/2022   PLT 18 (LL) 03/22/2022    Lab Results  Component Value Date   CREATININE 7.83 (H) 03/22/2022   BUN 98 (H) 03/22/2022   NA 139 03/22/2022   K 3.3 (L) 03/22/2022   CL 111 03/22/2022   CO2 14 (L) 03/22/2022    Lab Results  Component Value Date   ALT 9 03/19/2022   AST 22 03/19/2022   ALKPHOS 59 03/19/2022   BILITOT 0.7 03/19/2022        Laurice Record, [facility] 03/22/2022, 10:10 AM

## 2022-03-22 NOTE — TOC Progression Note (Signed)
Transition of Care [facility]) - Progression Note    Patient Details  Name: Christina Mack MRN: [id number] Date of Birth: [birth year]  [facility] CM/SW [place], RN Phone Number: 03/22/2022, 5:46 PM  Clinical Narrative:   TOC continue to follow for discharge needs         Expected Discharge Plan and Services                                               Social Determinants of Health (SDOH) Interventions SDOH Screenings   Food Insecurity: No Food Insecurity (03/20/2022)  Housing: Low Risk  (03/20/2022)  Transportation Needs: No Transportation Needs (03/20/2022)  Utilities: Not At Risk (03/20/2022)  Depression (PHQ2-9): Low Risk  (11/10/2021)  Tobacco Use: Low Risk  (03/20/2022)    Readmission Risk Interventions     No data to display

## 2022-03-22 NOTE — Progress Notes (Signed)
Patient expressed she did not want foley catheterization and would like to go home today. Informed Dr. Lupita Leash of patient's wishes and refusal.

## 2022-03-22 NOTE — Progress Notes (Signed)
Patient ID: Christina Mack, female   DOB: Mar 25, 1966, 56 y.o.   MRN: [id number] S: Declined foley catheter placement under anesthesia and suprapubic catheter placement.  Otherwise is feeling stronger and wants to sit on bedside commode to help empty her bladder. O:BP (!) 182/108 (BP Location: Right Arm) Comment: see MAR  Pulse 78   Temp 98.2 F (36.8 C) (Oral)   Resp (!) 21   Ht _0  (1.651 m)   Wt 61.4 kg   SpO2 98%   BMI 22.53 kg/m   Intake/Output Summary (Last 24 hours) at 03/22/2022 1148 Last data filed at 03/22/2022 1130 Gross per 24 hour  Intake 1240 ml  Output 1900 ml  Net -660 ml   Intake/Output: I/O last 3 completed shifts: In: 3709 [P.O.:1260; Blood:384; IV Piggyback:100] Out: 1200 [Urine:1200]  Intake/Output this shift:  Total I/O In: -  Out: 700 [Urine:700] Weight change:  UKR:[id number] ill-appearing CVS:RRR Resp: CTA Abd: +BS, soft, NT/ND Ext: no edema  Recent Labs  Lab 03/19/22 1202 03/19/22 2157 03/20/22 0304 03/21/22 0253 03/22/22 0247  NA 136 139 138 140 139  K 6.7* 5.3* 4.5 3.8 3.3*  CL 111 112* 112* 112* 111  CO2 <7* 9* 11* 12* 14*  GLUCOSE 99 139* 150* 130* 124*  BUN 93* 89* 89* 92* 98*  CREATININE 9.30* 8.49* 8.35* 7.88* 7.83*  ALBUMIN 3.4*  --   --  2.7* 2.2*  CALCIUM 7.9* 7.3* 6.9* 6.6* 6.9*  PHOS  --   --   --  7.1* 6.6*  AST 22  --   --   --   --   ALT 9  --   --   --   --    Liver Function Tests: Recent Labs  Lab 03/19/22 1202 03/21/22 0253 03/22/22 0247  AST 22  --   --   ALT 9  --   --   ALKPHOS 59  --   --   BILITOT 0.7  --   --   PROT 7.7  --   --   ALBUMIN 3.4* 2.7* 2.2*   Recent Labs  Lab 03/19/22 1202  LIPASE 83*   No results for input(s): "AMMONIA" in the last 168 hours. CBC: Recent Labs  Lab 03/19/22 1202 03/19/22 2157 03/20/22 0304 03/20/22 0843 03/20/22 1535 03/21/22 0253 03/21/22 1148 03/22/22 0247  WBC 3.6* 1.5* 1.3*  --  1.0* 1.2*  --  1.2*  NEUTROABS 2.3  --   --   --  0.6*  --   --   --    HGB 6.4* 6.3* 6.1*   < > 7.2* 6.9* 7.8* 7.7*  HCT 21.0* 19.5* 18.8*   < > 22.4* 21.3* 23.5* 23.6*  MCV 96.3 94.7 91.7  --  93.3 92.2  --  92.5  PLT 31* 29* 23*  --  30* 27*  --  18*   < > = values in this interval not displayed.   Cardiac Enzymes: No results for input(s): "CKTOTAL", "CKMB", "CKMBINDEX", "TROPONINI" in the last 168 hours. CBG: Recent Labs  Lab 03/19/22 1455  GLUCAP 82    Iron Studies: No results for input(s): "IRON", "TIBC", "TRANSFERRIN", "FERRITIN" in the last 72 hours. Studies/Results: US PELVIS (TRANSABDOMINAL ONLY)  Result Date: 03/21/2022 CLINICAL DATA:  56 year old woman with history of vulvar carcinoma and chronic pelvic osteomyelitis. Chronic thickening of right labial soft tissues. EXAM: LIMITED ULTRASOUND OF PELVIS TECHNIQUE: Limited transabdominal ultrasound examination of the pelvis was performed. COMPARISON:  CT abdomen pelvis 03/20/2022  FINDINGS: Targeted sonographic evaluation of the area of concern in the right labial region demonstrates soft tissue thickening without discrete fluid collection. IMPRESSION: Sonographic evaluation of the area of concern in the right labia demonstrates soft tissue thickening similar to prior CT scans dating back to 04/03/2020. This may be due to chronic inflammation or postsurgical change. No discrete fluid collection identified to indicate abscess. Electronically Signed   By: Miachel Roux M.D.   On: 03/21/2022 14:08   CT ABDOMEN PELVIS WO CONTRAST  Result Date: 03/20/2022 CLINICAL DATA:  Pelvic osteomyelitis. EXAM: CT ABDOMEN AND PELVIS WITHOUT CONTRAST TECHNIQUE: Multidetector CT imaging of the abdomen and pelvis was performed following the standard protocol without IV contrast. RADIATION DOSE REDUCTION: This exam was performed according to the departmental dose-optimization program which includes automated exposure control, adjustment of the mA and/or kV according to patient size and/or use of iterative reconstruction  technique. COMPARISON:  PET-CT dated 03/09/2022. CT abdomen and pelvis dated 04/03/2020. FINDINGS: Lower chest: No acute abnormality. Hepatobiliary: No focal liver abnormality is seen. Gallbladder is distended but otherwise unremarkable. No bile duct dilatation is seen. Pancreas: Unremarkable. No pancreatic ductal dilatation or surrounding inflammatory changes. Spleen: Normal in size without focal abnormality. Adrenals/Urinary Tract: Native kidneys are atrophic. 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 Additional hydronephrosis of the somewhat atrophic-appearing transplant kidney in the LEFT pelvis. Bladder is moderately distended but otherwise unremarkable. Stomach/Bowel: No dilated large or small bowel loops are seen within the abdomen or pelvis. Stomach is unremarkable. Vascular/Lymphatic: No abdominal aortic aneurysm. Asymmetrically prominent LEFT common iliac vein, of uncertain significance. No enlarged lymph nodes are identified within the abdomen or pelvis. Reproductive: Presumed hysterectomy.  No adnexal mass or free fluid. Other: Asymmetric soft tissue thickening involving the upper RIGHT perineum, incompletely imaged, with soft tissue wound underlying the RIGHT inferior pubic ramus. Destructive/lytic changes of the bilateral symphysis pubis and RIGHT inferior pubic ramus (discontinuity of the mid/posterior portion of the RIGHT inferior pubic ramus), corresponding to patient's known pathologic process in this area and the previously imaging reports describing a chronic open wound of the RIGHT perineum. However, there is increased destruction/discontinuity of the RIGHT inferior pubic ramus compared to the earlier PET-CT of 07/20/2021 indicating some progression. Symphysis pubis is stable. Musculoskeletal: Also, chronic pathologic-appearing fracture deformity at the lateral aspect of the RIGHT superior pubic ramus/anterior acetabulum. The adjacent RIGHT  hip prosthesis appears intact and stable in position. Osseous structures of the abdomen are unremarkable. IMPRESSION: 1. Asymmetric soft tissue thickening involving the upper RIGHT perineum, incompletely imaged, with chronic open soft tissue wound underlying the RIGHT inferior pubic ramus. Associated destructive/lytic changes of the RIGHT inferior pubic ramus (discontinuity of the mid/posterior portion of the RIGHT inferior pubic ramus), corresponding to patient's known pathologic process in this area. However, there is increased destruction/discontinuity of the RIGHT inferior pubic ramus compared to the earlier PET-CT of 07/20/2021 indicating some degree of progression. 2. Chronic pathologic-appearing fracture deformity at the lateral aspect of the RIGHT superior pubic ramus/anterior acetabulum. The adjacent RIGHT hip prosthesis appears intact and stable in position. 3. 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 Additional hydronephrosis of the somewhat atrophic-appearing transplant kidney in the LEFT pelvis. Bladder is distended, suggesting associated bladder outlet obstruction. Electronically Signed   By: Franki Cabot M.D.   On: 03/20/2022 14:50    sodium chloride   Intravenous Once   sodium chloride   Intravenous  Once   carvedilol  12.5 mg Oral BID WC   Chlorhexidine Gluconate Cloth  6 each Topical Daily   doxycycline  100 mg Oral Q12H   hydrocortisone sodium succinate  100 mg Intravenous Daily   mouth rinse  15 mL Mouth Rinse 4 times per day   pantoprazole (PROTONIX) IV  40 mg Intravenous Q12H    BMET    Component Value Date/Time   NA 139 03/22/2022 0247   NA 139 11/26/2012 1758   K 3.3 (L) 03/22/2022 0247   K 4.6 11/26/2012 1758   CL 111 03/22/2022 0247   CL 105 11/26/2012 1758   CO2 14 (L) 03/22/2022 0247   CO2 22 11/26/2012 1758   GLUCOSE 124 (H) 03/22/2022 0247   GLUCOSE 88 11/26/2012 1758   BUN 98 (H) 03/22/2022 0247   BUN 33  (H) 11/26/2012 1758   CREATININE 7.83 (H) 03/22/2022 0247   CREATININE 2.93 (H) 06/08/2020 1544   CALCIUM 6.9 (L) 03/22/2022 0247   CALCIUM 9.7 11/26/2012 1758   GFRNONAA 6 (L) 03/22/2022 0247   GFRNONAA 19 (L) 11/26/2012 1758   GFRAA 22 (L) 11/26/2012 1758   CBC    Component Value Date/Time   WBC 1.2 (LL) 03/22/2022 0247   RBC 2.55 (L) 03/22/2022 0247   HGB 7.7 (L) 03/22/2022 0247   HGB 10.5 (L) 11/26/2012 1758   HCT 23.6 (L) 03/22/2022 0247   HCT 31.4 (L) 11/26/2012 1758   PLT 18 (LL) 03/22/2022 0247   PLT 190 11/26/2012 1758   MCV 92.5 03/22/2022 0247   MCV 89 11/26/2012 1758   MCH 30.2 03/22/2022 0247   MCHC 32.6 03/22/2022 0247   RDW 16.9 (H) 03/22/2022 0247   RDW 13.2 11/26/2012 1758   LYMPHSABS 0.3 (L) 03/20/2022 1535   LYMPHSABS 2.4 11/26/2012 1758   MONOABS 0.1 03/20/2022 1535   MONOABS 0.7 11/26/2012 1758   EOSABS 0.0 03/20/2022 1535   EOSABS 0.1 11/26/2012 1758   BASOSABS 0.0 03/20/2022 1535   BASOSABS 0.0 11/26/2012 1758    Assessment/ Plan: AKI on CKD 5 - b/l creat 5.72- 7.10, from aug - nov 2023, eGFR 6- 8 ml/min. Creat 9.30 on admission.  AKI likely multifactorial with ischemic ATN in setting of several days of poor po intake and recurrent vomiting/ dry heaves as well as ABLA due to GI bleed. UA  with large blood and protein.  Transplant renal US with moderate hydronephrosis but normal resistive indices.  CXR neg, no vol excess on exam. She is  now getting bicarb gtt at 100 cc/hr. Would continue. Suspect AKI due to n/v and vol depletion, vs progression to end-stage renal failure w/ uremia causing nausea/ vomiting. No neuro issues, will not proceed w/ dialysis yet. Place foley cath. IVF"s have improved BUN/Cr from admission but still with low UOP.  Her baseline function is very poor and it may be that we will need to start dialysis regardless of the treatments. I did discuss the eventual need to resume dialysis which she acknowledges, however despite a low eGFR, she  had not had vascular access placed in preparation.  She was referred to [facility] for a third transplant, however is high risk due to h/o ongoing osteomyelitis, so don't think she will be eligible.  We discussed RRT and she understands that she will need to start HD.  Would place [facility] when her platelets are suitable/safe.    Currently without overt uremic symptoms.  Continue with supportive care until we can safely place [facility]  to initiate HD.  Avoid nephrotoxic medications including NSAIDs and iodinated intravenous contrast exposure unless the latter is absolutely indicated.   Preferred narcotic agents for pain control are hydromorphone, fentanyl, and methadone. Morphine should not be used.  Avoid Baclofen and avoid oral sodium phosphate and magnesium citrate based laxatives / bowel preps.  Continue strict Input and Output monitoring. Will monitor the patient closely with you and intervene or adjust therapy as indicated by changes in clinical status/labs  Volume - looks quite dry on exam. IVF"s as above.  Acute blood loss anemia on Anemia of CKD stage 5- she was transfused 1 unit PRBC early this morning.  Had high tsat 95% last admission in Nov 2023, and ferritin high at 1352.  No need for IV Fe.  Required another transfusion this morning.  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  Declined foley or suprapubic catheter placement.  Pancytopenia - plts 35K, WBC low. Per pmd.  Getting platelet transfusion due to GI bleed.   Renal transplant - in 2014. 2nd transplant. Cont to hold prograf given leukopenia. Pancytopenia - per CCM they suspect this could be due to her prolonged zyvox exposure.  Continue to transfuse platelets and PRBC's as needed.  Consider Heme evaluation.  ID - some signs of sepsis like tachypnea and tachycardia. Started on IV vanc and cefepime. Blood / urine cx's ordered and pending.   Now off of vanco and on doxycycline and cefepime. 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 Had been on zyvox and moxifloxacin po. Now on doxy and cefepime. SP recent R THA and tibial plateau fracture Oct 2023  Donetta Potts, MD [facility]

## 2022-03-23 ENCOUNTER — Inpatient Hospital Stay: Payer: Self-pay

## 2022-03-23 DIAGNOSIS — N179 Acute kidney failure, unspecified: Secondary | ICD-10-CM | POA: Diagnosis not present

## 2022-03-23 DIAGNOSIS — Z515 Encounter for palliative care: Secondary | ICD-10-CM | POA: Diagnosis not present

## 2022-03-23 DIAGNOSIS — R52 Pain, unspecified: Secondary | ICD-10-CM

## 2022-03-23 DIAGNOSIS — N19 Unspecified kidney failure: Secondary | ICD-10-CM | POA: Diagnosis not present

## 2022-03-23 DIAGNOSIS — D696 Thrombocytopenia, unspecified: Secondary | ICD-10-CM

## 2022-03-23 LAB — PREPARE PLATELET PHERESIS: Unit division: 0

## 2022-03-23 LAB — RENAL FUNCTION PANEL
Albumin: 2.5 g/dL — ABNORMAL LOW (ref 3.5–5.0)
Anion gap: 13 (ref 5–15)
BUN: 107 mg/dL — ABNORMAL HIGH (ref 6–20)
CO2: 14 mmol/L — ABNORMAL LOW (ref 22–32)
Calcium: 7.2 mg/dL — ABNORMAL LOW (ref 8.9–10.3)
Chloride: 109 mmol/L (ref 98–111)
Creatinine, Ser: 7.36 mg/dL — ABNORMAL HIGH (ref 0.44–1.00)
GFR, Estimated: 6 mL/min — ABNORMAL LOW (ref >60)
Glucose, Bld: 125 mg/dL — ABNORMAL HIGH (ref 70–99)
Phosphorus: 5.5 mg/dL — ABNORMAL HIGH (ref 2.5–4.6)
Potassium: 3.2 mmol/L — ABNORMAL LOW (ref 3.5–5.1)
Sodium: 136 mmol/L (ref 135–145)

## 2022-03-23 LAB — BPAM PLATELET PHERESIS
Blood Product Expiration Date: 202312202359
ISSUE DATE / TIME: 202312202005
Unit Type and Rh: 6200

## 2022-03-23 LAB — CBC
HCT: 23.9 % — ABNORMAL LOW (ref 36.0–46.0)
Hemoglobin: 7.9 g/dL — ABNORMAL LOW (ref 12.0–15.0)
MCH: 30.6 pg (ref 26.0–34.0)
MCHC: 33.1 g/dL (ref 30.0–36.0)
MCV: 92.6 fL (ref 80.0–100.0)
Platelets: 21 10*3/uL — CL (ref 150–400)
RBC: 2.58 MIL/uL — ABNORMAL LOW (ref 3.87–5.11)
RDW: 16.9 % — ABNORMAL HIGH (ref 11.5–15.5)
WBC: 1 10*3/uL — CL (ref 4.0–10.5)
nRBC: 0 % (ref 0.0–0.2)

## 2022-03-23 LAB — VITAMIN B12: Vitamin B-12: 628 pg/mL (ref 180–914)

## 2022-03-23 LAB — CK: Total CK: 15 U/L — ABNORMAL LOW (ref 38–234)

## 2022-03-23 LAB — FERRITIN: Ferritin: 4472 ng/mL — ABNORMAL HIGH (ref 11–307)

## 2022-03-23 MED ORDER — SODIUM CHLORIDE 0.9 % IV SOLN
8.0000 mg/kg | INTRAVENOUS | Status: DC
  Administered 2022-03-23 – 2022-03-25 (×2): 500 mg via INTRAVENOUS
  Filled 2022-03-23 (×2): qty 10

## 2022-03-23 MED ORDER — POTASSIUM CHLORIDE CRYS ER 20 MEQ PO TBCR
20.0000 meq | EXTENDED_RELEASE_TABLET | Freq: Two times a day (BID) | ORAL | Status: AC
  Administered 2022-03-23 – 2022-03-24 (×4): 20 meq via ORAL
  Filled 2022-03-23 (×4): qty 1

## 2022-03-23 NOTE — Progress Notes (Addendum)
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  Maintained on linezolid and moxifloxacin admitted for severe sepsis and pancytopenia.  #PsA(Pseudomonas aeruginosa) bacteremia likely 2/2 pelvic osteomyelitis #Pancytopenia in setting of linezolid #Pelvic osteomyelitis and open groin wound #SP R hip total arthoplasty #Vulvar carcinoma in 2021  status postchemotherapy and radiation   - She is followed by infectious disease, [name] last seen on 9/19 for osteomyelitis of the right pelvic region.  Patient has had multiple courses of antibiotics in the past.  ID visit on 9/19 she had completed prolonged  courses(12 weeks) of antibiotics including linezolid  and moxifloxacin, previously Levaquin, Doxy and tedizolid.  Noted that she had refused PICC lines in the past.  Plans was for her to see Dr. Sharol Given for surgical management and bone biopsy, sent for possible 16 mecillinam cultures. -Taken to the OR on 01/22/2022 as imaging showed subcapital fracture of the right femur and large lipohemarthrosis of right knee.  Patient underwent right total hip arthroplasty.  No cultures obtained.  She was then followed by orthopedics outpatient - Seen on 11/3 for postop visit with orthopedics started on linezolid and moxifloxacin.  Plan was to continue antibiotics till she can be seen by orthopedic oncologist Dr. Percell Miller at [facility] for further  management for pelvic osteomyelitis. - Patient notes that she needs to see neurology for she can be managed by orthopedics oncology. -PET/CT on 12/8 showed worsening inflammation around the right groin, focal in nature suggest ultrasound.  Progressive changes of osteomyelitis pubic bone fracture on the right. -CT showed increased destruction/discontinuity of right anterior right inf pubic ramus compared to earlier CT on 07/20/2021. Indicating some degree of progression  Chronic open soft tissue wound underlying right inferior pubic ramus with asymmetric soft tissue thickening incompletely imaged.  Right hip prosthesis is intact and stable position.  Hydronephrosis of the transplant kidney in the right lower pelvis.  Hydronephrosis of left kidney.  Bladder distended with possible bladder outlet obstruction. I spoke with rads for c/f possible prosthesis involvement given proximity to osteo->recc NM study.  -Pelvic U/S to evaluate groin wound showed chronic inflammation. Recommendations:  -NM scan noted no evidence of right hip prosthetic infection.  -I think will need to treat for bacteremia 2/2 pubic osteo x6 weeks. As pt has been on linezolid for weeks will place on dapto as well. Continue merrem( PsA cefepime MIC 8). EOT 05/03/22. Pt plan on calling ortho/onc at [facility] for appt ASAP.  -D/C doxy -Discussed with pt that there no good IV abx option, she is amenable to IV abx x 6 weeks via line. -Plan PICC order, given CKD/transplant Hx will likly need central line -F/U with ID outpatient -ID will sign off    #Hx of renal transplant x2 #Bladder outlet obstruction -Follows with transplant -Urology engaged   OPAT ORDERS:  Diagnosis: PsA bacteremia 2/2 pelvic osteo  Culture Result: PsA bacteremia 2/2  pelvic osteo  Allergies  Allergen Reactions   Oxycodone-Acetaminophen Itching, Nausea And Vomiting, Nausea Only, Rash and Other (See Comments)    Headaches, also    Piperacillin-Tazobactam In Dex      Other Reaction(s): Other (See Comments)  Thrombocytopenia  Thrombocytopenia   Zosyn [Piperacillin Sod-Tazobactam So] Other (See Comments)    Thrombocytopenia   Hydromorphone Nausea And Vomiting, Rash and Other (See Comments)   Amlodipine Other (See Comments) and Swelling    Ankle swelling   Codeine     vomiting     Discharge antibiotics to be given via PICC line:  Per pharmacy protocol meropenem an dapto Aim for Vancomycin trough 15-20 or AUC 400-550 (unless otherwise indicated)   Duration: 6 weeks End Date: 05/03/22  [facility] Care Per Protocol with [employer] Use: Home health RN for IV administration and teaching, line care and labs.    Labs weekly while on IV antibiotics: _x_ CBC with differential _x_ CMP _x_ CRP __x ESR _x_ CK  x__ Please leave PIC in place until doctor has seen patient or been notified  Fax weekly labs to [phone]  Clinic Follow Up Appt: 04/11/22  @ [facility] with [name] NP  Microbiology:   Antibiotics: Vnaomcycin and cefepime 12/17- Cultures: Blood 12/17 1/2 PSA  Urine 12/17-insignificant growth SUBJECTIVE: Laying in bed. Husband at bedside.   Interval: Afebrile overnight  Review of Systems: Review of Systems  All other systems reviewed and are negative.    Scheduled Meds:  sodium chloride   Intravenous Once   sodium chloride   Intravenous Once   carvedilol  12.5 mg Oral BID WC   Chlorhexidine Gluconate Cloth  6 each Topical Daily   doxycycline  100 mg Oral Q12H   hydrocortisone sodium succinate  100 mg Intravenous Daily   mouth rinse  15 mL Mouth Rinse 4 times per day   pantoprazole (PROTONIX) IV  40 mg Intravenous Q12H   potassium chloride  20 mEq Oral BID   Continuous Infusions:  meropenem (MERREM) IV     PRN Meds:.acetaminophen, docusate sodium, hydrALAZINE, HYDROcodone-acetaminophen, lip balm, ondansetron (ZOFRAN) IV, mouth rinse, polyethylene glycol, sodium chloride Allergies  Allergen Reactions    Oxycodone-Acetaminophen Itching, Nausea And Vomiting, Nausea Only, Rash and Other (See Comments)    Headaches, also    Piperacillin-Tazobactam In Dex     Other Reaction(s): Other (See Comments)  Thrombocytopenia  Thrombocytopenia   Zosyn [Piperacillin Sod-Tazobactam So] Other (See Comments)    Thrombocytopenia   Hydromorphone Nausea And Vomiting, Rash and Other (See Comments)   Amlodipine Other (See Comments) and Swelling    Ankle swelling   Codeine     vomiting    OBJECTIVE: Vitals:   03/23/22 0400 03/23/22 0500 03/23/22 0600 03/23/22 0800  BP: (!) 162/87  (!) 140/75   Pulse: 84 81 70   Resp: 18 (!) 23 19   Temp: 97.8 F (36.6 C)   98.7 F (37.1 C)  TempSrc: Oral   Oral  SpO2: 99% 98% 98%   Weight:  61.3 kg    Height:       Body mass index is 22.49 kg/m.  Physical Exam Constitutional:      Appearance: Normal appearance.  HENT:     Head: Normocephalic and atraumatic.     Right Ear: Tympanic membrane normal.     Left Ear: Tympanic membrane normal.     Nose: Nose normal.     Mouth/Throat:     Mouth: Mucous membranes are moist.  Eyes:  Extraocular Movements: Extraocular movements intact.     Conjunctiva/sclera: Conjunctivae normal.     Pupils: Pupils are equal, round, and reactive to light.  Cardiovascular:     Rate and Rhythm: Normal rate and regular rhythm.     Heart sounds: No murmur heard.    No friction rub. No gallop.  Pulmonary:     Effort: Pulmonary effort is normal.     Breath sounds: Normal breath sounds.  Abdominal:     General: Abdomen is flat.     Palpations: Abdomen is soft.  Musculoskeletal:     Comments: Groin wound  Skin:    General: Skin is warm and dry.  Neurological:     General: No focal deficit present.     Mental Status: She is alert and oriented to person, place, and time.  Psychiatric:        Mood and Affect: Mood normal.       Lab Results Lab Results  Component Value Date   WBC 1.0 (LL) 03/23/2022   HGB 7.9 (L)  03/23/2022   HCT 23.9 (L) 03/23/2022   MCV 92.6 03/23/2022   PLT 21 (LL) 03/23/2022    Lab Results  Component Value Date   CREATININE 7.36 (H) 03/23/2022   BUN 107 (H) 03/23/2022   NA 136 03/23/2022   K 3.2 (L) 03/23/2022   CL 109 03/23/2022   CO2 14 (L) 03/23/2022    Lab Results  Component Value Date   ALT 9 03/19/2022   AST 22 03/19/2022   ALKPHOS 59 03/19/2022   BILITOT 0.7 03/19/2022        Laurice Record, [facility] Infectious [facility] 03/23/2022, 9:32 AM

## 2022-03-23 NOTE — Progress Notes (Signed)
Patient ID: Christina Mack, female   DOB: 1966/04/03, 56 y.o.   MRN: [id number] S: Complaining of back pain.  Better UOP when sitting on bedside commode. O:BP (!) 140/75   Pulse 70   Temp 98.7 F (37.1 C) (Oral)   Resp 19   Ht _0  (1.651 m)   Wt 61.3 kg   SpO2 98%   BMI 22.49 kg/m   Intake/Output Summary (Last 24 hours) at 03/23/2022 0937 Last data filed at 03/23/2022 0932 Gross per 24 hour  Intake 417 ml  Output 1000 ml  Net -583 ml   Intake/Output: I/O last 3 completed shifts: In: 1197 [P.O.:900; Blood:297] Out: 6712 [Urine:1550]  Intake/Output this shift:  Total I/O In: 120 [P.O.:120] Out: -  Weight change:  Gen: chronically ill-appearing, NAD CVS: RRR Resp:CTA Abd: +BS, soft,NT/ND Ext: no edema  Recent Labs  Lab 03/19/22 1202 03/19/22 2157 03/20/22 0304 03/21/22 0253 03/22/22 0247 03/23/22 0250  NA 136 139 138 140 139 136  K 6.7* 5.3* 4.5 3.8 3.3* 3.2*  CL 111 112* 112* 112* 111 109  CO2 <7* 9* 11* 12* 14* 14*  GLUCOSE 99 139* 150* 130* 124* 125*  BUN 93* 89* 89* 92* 98* 107*  CREATININE 9.30* 8.49* 8.35* 7.88* 7.83* 7.36*  ALBUMIN 3.4*  --   --  2.7* 2.2* 2.5*  CALCIUM 7.9* 7.3* 6.9* 6.6* 6.9* 7.2*  PHOS  --   --   --  7.1* 6.6* 5.5*  AST 22  --   --   --   --   --   ALT 9  --   --   --   --   --    Liver Function Tests: Recent Labs  Lab 03/19/22 1202 03/21/22 0253 03/22/22 0247 03/23/22 0250  AST 22  --   --   --   ALT 9  --   --   --   ALKPHOS 59  --   --   --   BILITOT 0.7  --   --   --   PROT 7.7  --   --   --   ALBUMIN 3.4* 2.7* 2.2* 2.5*   Recent Labs  Lab 03/19/22 1202  LIPASE 83*   No results for input(s): "AMMONIA" in the last 168 hours. CBC: Recent Labs  Lab 03/19/22 1202 03/19/22 2157 03/20/22 0304 03/20/22 0843 03/20/22 1535 03/21/22 0253 03/21/22 1148 03/22/22 0247 03/22/22 1634 03/23/22 0250  WBC 3.6*   < > 1.3*  --  1.0* 1.2*  --  1.2*  --  1.0*  NEUTROABS 2.3  --   --   --  0.6*  --   --   --   --   --    HGB 6.4*   < > 6.1*   < > 7.2* 6.9* 7.8* 7.7*  --  7.9*  HCT 21.0*   < > 18.8*   < > 22.4* 21.3* 23.5* 23.6*  --  23.9*  MCV 96.3   < > 91.7  --  93.3 92.2  --  92.5  --  92.6  PLT 31*   < > 23*  --  30* 27*  --  18* 15* 21*   < > = values in this interval not displayed.   Cardiac Enzymes: No results for input(s): "CKTOTAL", "CKMB", "CKMBINDEX", "TROPONINI" in the last 168 hours. CBG: Recent Labs  Lab 03/19/22 1455  GLUCAP 82    Iron Studies:  Recent Labs    03/23/22 0250  FERRITIN 4,472*   Studies/Results: NM Bone Scan 3 Phase  Result Date: 03/22/2022 CLINICAL DATA:  RIGHT hip prosthesis.  Concern for joint infection. EXAM: NUCLEAR MEDICINE 3-PHASE BONE SCAN TECHNIQUE: Radionuclide angiographic images, immediate static blood pool images, and 3-hour delayed static images were obtained of the hips after intravenous injection of radiopharmaceutical. RADIOPHARMACEUTICALS:  21.1 mCi Tc-28mMDP IV COMPARISON:  CT 03/20/2022, PET-CT 03/09/2022 FINDINGS: Vascular phase: No asymmetric or increased blood flow to the LEFT or RIGHT hip. Blood pool phase: No abnormal blood pool activity within LEFT RIGHT hip. Photopenia noted associated with the RIGHT hip prosthetic. Delayed phase: Delayed phase imaging demonstrates no significant abnormal increased activity associated the RIGHT hip prosthetic. IMPRESSION: No evidence infection the RIGHT hip prosthetic Electronically Signed   By: SSuzy BouchardM.D.   On: 03/22/2022 17:09   [place]PELVIS (TRANSABDOMINAL ONLY)  Result Date: 03/21/2022 CLINICAL DATA:  56year old woman with history of vulvar carcinoma and chronic pelvic osteomyelitis. Chronic thickening of right labial soft tissues. EXAM: LIMITED ULTRASOUND OF PELVIS TECHNIQUE: Limited transabdominal ultrasound examination of the pelvis was performed. COMPARISON:  CT abdomen pelvis 03/20/2022 FINDINGS: Targeted sonographic evaluation of the area of concern in the right labial region demonstrates soft  tissue thickening without discrete fluid collection. IMPRESSION: Sonographic evaluation of the area of concern in the right labia demonstrates soft tissue thickening similar to prior CT scans dating back to 04/03/2020. This may be due to chronic inflammation or postsurgical change. No discrete fluid collection identified to indicate abscess. Electronically Signed   By: FMiachel RouxM.D.   On: 03/21/2022 14:08    sodium chloride   Intravenous Once   sodium chloride   Intravenous Once   carvedilol  12.5 mg Oral BID WC   Chlorhexidine Gluconate Cloth  6 each Topical Daily   doxycycline  100 mg Oral Q12H   hydrocortisone sodium succinate  100 mg Intravenous Daily   mouth rinse  15 mL Mouth Rinse 4 times per day   pantoprazole (PROTONIX) IV  40 mg Intravenous Q12H   potassium chloride  20 mEq Oral BID    BMET    Component Value Date/Time   NA 136 03/23/2022 0250   NA 139 11/26/2012 1758   K 3.2 (L) 03/23/2022 0250   K 4.6 11/26/2012 1758   CL 109 03/23/2022 0250   CL 105 11/26/2012 1758   CO2 14 (L) 03/23/2022 0250   CO2 22 11/26/2012 1758   GLUCOSE 125 (H) 03/23/2022 0250   GLUCOSE 88 11/26/2012 1758   BUN 107 (H) 03/23/2022 0250   BUN 33 (H) 11/26/2012 1758   CREATININE 7.36 (H) 03/23/2022 0250   CREATININE 2.93 (H) 06/08/2020 1544   CALCIUM 7.2 (L) 03/23/2022 0250   CALCIUM 9.7 11/26/2012 1758   GFRNONAA 6 (L) 03/23/2022 0250   GFRNONAA 19 (L) 11/26/2012 1758   GFRAA 22 (L) 11/26/2012 1758   CBC    Component Value Date/Time   WBC 1.0 (LL) 03/23/2022 0250   RBC 2.58 (L) 03/23/2022 0250   HGB 7.9 (L) 03/23/2022 0250   HGB 10.5 (L) 11/26/2012 1758   HCT 23.9 (L) 03/23/2022 0250   HCT 31.4 (L) 11/26/2012 1758   PLT 21 (LL) 03/23/2022 0250   PLT 190 11/26/2012 1758   MCV 92.6 03/23/2022 0250   MCV 89 11/26/2012 1758   MCH 30.6 03/23/2022 0250   MCHC 33.1 03/23/2022 0250   RDW 16.9 (H) 03/23/2022 0250   RDW 13.2 11/26/2012 1758   LYMPHSABS 0.3 (L)  03/20/2022 1535    LYMPHSABS 2.4 11/26/2012 1758   MONOABS 0.1 03/20/2022 1535   MONOABS 0.7 11/26/2012 1758   EOSABS 0.0 03/20/2022 1535   EOSABS 0.1 11/26/2012 1758   BASOSABS 0.0 03/20/2022 1535   BASOSABS 0.0 11/26/2012 1758    Assessment/ Plan: AKI on CKD 5 - b/l creat 5.72- 7.10, from aug - nov 2023, eGFR 6- 8 ml/min. Creat 9.30 on admission.  AKI likely multifactorial with ischemic ATN in setting of several days of poor po intake and recurrent vomiting/ dry heaves as well as ABLA due to GI bleed. UA  with large blood and protein.  Transplant renal US with moderate hydronephrosis but normal resistive indices.  CXR neg, no vol excess on exam. She is  now getting bicarb gtt at 100 cc/hr. Would continue. Suspect AKI due to n/v and vol depletion, vs progression to end-stage renal failure w/ uremia causing nausea/ vomiting. No neuro issues, will not proceed w/ dialysis yet. Place foley cath. IVF"s have improved BUN/Cr from admission but still with low UOP.  Her baseline function is very poor and it may be that we will need to start dialysis regardless of the treatments. I did discuss the eventual need to resume dialysis which she acknowledges, however despite a low eGFR, she had not had vascular access placed in preparation.  She was referred to [facility] for a third transplant, however is high risk due to h/o ongoing osteomyelitis, so don't think she will be eligible.  We discussed RRT and she understands that she will need to start HD.  Would place [facility] when her platelets are suitable/safe, however she would like to wait until she is seen by [facility] transplant and Dr. Joelyn Oms on 04/08/22 before agreeing to starting HD.  Currently without overt uremic symptoms.  Continue with supportive care until we can safely place [facility] to initiate HD.  Avoid nephrotoxic medications including NSAIDs and iodinated intravenous contrast exposure unless the latter is absolutely indicated.   Preferred narcotic agents for pain control are hydromorphone,  fentanyl, and methadone. Morphine should not be used.  Avoid Baclofen and avoid oral sodium phosphate and magnesium citrate based laxatives / bowel preps.  Continue strict Input and Output monitoring. Will monitor the patient closely with you and intervene or adjust therapy as indicated by changes in clinical status/labs  Volume - looks quite dry on exam. IVF"s as above.  Acute blood loss anemia on Anemia of CKD stage 5- she was transfused 1 unit PRBC early this morning.  Had high tsat 95% last admission in Nov 2023, and ferritin high at 1352.  No need for IV Fe.  Required another transfusion this morning.  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  Declined foley or suprapubic catheter placement.  Pancytopenia - plts 35K, WBC low. Per pmd.  Getting platelet transfusion due to GI bleed.   Renal transplant - in 2014. 2nd transplant. Cont to hold prograf given leukopenia. Pancytopenia - per CCM they suspect this could be due to her prolonged zyvox exposure.  Continue to transfuse platelets and PRBC's as needed.  Consider Heme evaluation.  ID - some signs of sepsis like tachypnea and tachycardia. Started on IV vanc and cefepime. Blood / urine cx's ordered and pending.   Now off of vanco and on doxycycline and cefepime. 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 Had been on  zyvox and moxifloxacin po. Now on doxy and cefepime. SP recent R THA and tibial plateau fracture Oct 2023  Donetta Potts, MD [facility]

## 2022-03-23 NOTE — Progress Notes (Signed)
PHARMACY CONSULT NOTE FOR:  OUTPATIENT  PARENTERAL ANTIBIOTIC THERAPY (OPAT)  Indication: pseudomonas bacteremia and pelvic osteomyelitis Regimen:  Daptomycin 553m IV q48h Meropenem 5038mIV q24h End date: 05/03/22   -Labs - Once weekly:  CBC/D, CMP, CPK, ESR and CRP -Please leave PIC in place until doctor has seen patient or been notified -Fax weekly labs to [phone] antibiotic discharge orders are pended. To discharging provider:  please sign these orders via discharge navigator,  Select New Orders & click on the button choice - Manage This Unsigned Work.     Thank you for allowing pharmacy to be a part of this patient's care.  DuDoreene ElandPharmD, BCPS, BCIDP Work Cell: [phone]/21/2023 4:32 PM

## 2022-03-23 NOTE — Progress Notes (Signed)
PROGRESS NOTE Christina Mack  BJS:[id number] DOB: [birth year] DOA: 03/19/2022 PCP: System, Provider Not In   Brief Narrative/Hospital Course: 56yof w/hx vulvar CA s/p chemo/XRT complicated by chronic R groin/inguinal open wound with known osteomyelitis, supposed to be seeing ortho onc at [facility] at some point, followed by ID here for chronic wound, has been on Linezolid for a few months presented with generalized weakness and decreased oral intake x 3 days Patient seen in the ED found to have severe hyperkalemia pancytopenia lactic acidosis FOBT positive admitted to ICU, with weakness and pancytopenia felt to be 2/2 Linezolid, changed to vancomycin seen.  She has been needing blood transfusion and platelet transfusion but no ongoing bleeding.  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 Patient stabilized transferred to [facility] 12/19. 12/19 Nursing unable to insert Foley catheter urology consulted but patient refused 12/21, spontaneously voiding.     Subjective: Seen and examined in the morning rounds.  No overnight events.  Her back hurts mostly because of laying in the bed.  She was able to urinate if able to get out of the bed to the commode.  He denies any chest pain shortness of breath or nausea.  Assessment and Plan: Principal Problem:   AKI (acute kidney injury) ([place]) Active Problems:   Pancytopenia ([place])   Pressure injury of skin   Renal failure   Need for emotional support   Goals of care, counseling/discussion   Weakness   Chronic osteomyelitis ([place])   Palliative care encounter   Bacteremia due to Pseudomonas   Pancytopenia: Felt to be due to linezolid.  ID following holding linezolid for now continue vancomycin.    Pseudomonas bacteremia Concern for severe sepsis Pelvic Osteomyelitis and open groin wound: ID input appreciated.Repeat CT completed 12/18> CT report shows asymmetric soft tissue thickening on upper right perineum, chronic open soft  tissue wound underlying the right inferior pubic ramus, increased destruction of the right inferior pubic ramus compared to PET/CT on 4/19.  Chronic pathologic appearing fracture deformity around right groin.  Pseudomonas is resistant to Cipro so no oral option, ID managing currently on meropenem and doxy. Given her right hip prosthesis NM bone scan was done that was negative for infection around the hardware.   Recent Labs  Lab 03/19/22 1400 03/19/22 1524 03/19/22 2157 03/20/22 0304 03/20/22 1535 03/21/22 0253 03/22/22 0247 03/23/22 0250  WBC  --   --    < > 1.3* 1.0* 1.2* 1.2* 1.0*  LATICACIDVEN 3.9* 3.3*  --   --   --   --   --   --    < > = values in this interval not displayed.     Diarrhea negative for C. difficile antigen.  Continue symptomatic management  AKI on CKD 5 with metabolic acidosis- b/l creat 5.7-7.1 Renal transplant status x2 Hyperkalemia resolved: Nephrology on board.  Suspected ischemic ATN also multifactorial in the setting of decreased oral intake recurrent vomiting acute blood loss anemia.Avoid obstruction medication including NSAIDs contrast, monitor intake output Daily weight and renal function per nephrology.  No evidence of volume overload.  With baseline poor functional pending dialysis, no vascular access yet has been referred to [facility] for a third transplant but is high risk due to history of ongoing osteomyelitis.  Immunosuppression on hold -discussed w/ nephro- cont to hold 2/2 pancytopenia and will resume once stableContinue steroid iv. Recent Labs  Lab 03/19/22 2157 03/20/22 0304 03/21/22 0253 03/22/22 0247 03/23/22 0250  BUN 89* 89* 92* 98*  107*  CREATININE 8.49* 8.35* 7.88* 7.83* 7.36*    Urine retention seen in CT- nephro advised foley-12/19 Nursing unable to insert Foley catheter urology consulted but patient refused.  Able to void with mobility.  Acute blood loss anemia Anemia of chronic renal disease Upper GI bleeding : ESA per nephrology.   Needing multiple PRBC transfusion.  Monitor and transfuse for < 7gm.  Reports no rectal bleeding, having brown stool.  Eliquis is on hold.  Hemoglobin adequate today. Recent Labs  Lab 03/20/22 1535 03/21/22 0253 03/21/22 1148 03/22/22 0247 03/23/22 0250  HGB 7.2* 6.9* 7.8* 7.7* 7.9*  HCT 22.4* 21.3* 23.5* 23.6* 23.9*     History of DVT 2021.  Recent duplex negative. Eliquis on hold.   Hypertension blood pressure uncontrolled this morning resumed Coreg at half of home dose 12.5 mg  Pressure ulcer in mid sacrum POA continue wound care.    Recent right THA and tibial plateau fracture October 2023 56yof 2021- treated with chemoradiation  GOC: Full code.    DVT prophylaxis: SCDs Start: 03/19/22 1636 Code Status:   Code Status: Full Code Family Communication: plan of care discussed with patient at bedside. Patient status is: Inpatient because of severe sepsis, renal failure.  IV antibiotics. Level of care: Stepdown  Dispo: The patient is from: HOME            Anticipated disposition: TBD.  Anticipate home with home infusion. Objective: Vitals last 24 hrs: Vitals:   03/23/22 0400 03/23/22 0500 03/23/22 0600 03/23/22 0800  BP: (!) 162/87  (!) 140/75   Pulse: 84 81 70   Resp: 18 (!) 23 19   Temp: 97.8 F (36.6 C)   98.7 F (37.1 C)  TempSrc: Oral   Oral  SpO2: 99% 98% 98%   Weight:  61.3 kg    Height:       Weight change:   Physical Examination:  General: Looks quite comfortable at rest.  Abnormal. Cardiovascular: S1-S2 normal.  Regular rate rhythm. Respiratory: Bilateral clear.  No added sounds. Gastrointestinal: Soft.  Nontender.  Bowel sound present. Ext: Generalized weakness.  No edema.  No cyanosis. Neuro: Alert awake and oriented.    Medications reviewed:  Scheduled Meds:  sodium chloride   Intravenous Once   sodium chloride   Intravenous Once   carvedilol  12.5 mg Oral BID WC   Chlorhexidine Gluconate Cloth  6 each Topical Daily    doxycycline  100 mg Oral Q12H   hydrocortisone sodium succinate  100 mg Intravenous Daily   mouth rinse  15 mL Mouth Rinse 4 times per day   pantoprazole (PROTONIX) IV  40 mg Intravenous Q12H   potassium chloride  20 mEq Oral BID   Continuous Infusions:  meropenem (MERREM) IV 500 mg (03/23/22 1008)   Diet Order             Diet renal with fluid restriction Fluid restriction: 2000 mL Fluid; Room service appropriate? Yes; Fluid consistency: Thin  Diet effective now                  Intake/Output Summary (Last 24 hours) at 03/23/2022 1030 Last data filed at 03/23/2022 0838 Gross per 24 hour  Intake 417 ml  Output 850 ml  Net -433 ml    Net IO Since Admission: 2,652.31 mL [03/23/22 1030]  Wt Readings from Last 3 Encounters:  03/23/22 61.3 kg  03/09/22 63.5 kg  01/22/22 69 kg     Unresulted Labs (  From admission, onward)     Start     Ordered   03/23/22 0500  Folate RBC  Tomorrow morning,   R       Question:  Specimen collection method  Answer:  Lab=Lab collect   03/22/22 1615   03/22/22 0500  CBC  Daily,   R     Question:  Specimen collection method  Answer:  Lab=Lab collect   03/21/22 1020   03/21/22 0500  Renal function panel  Daily,   R     Question:  Specimen collection method  Answer:  Lab=Lab collect   03/20/22 1153          Data Reviewed: I have personally reviewed following labs and imaging studies CBC: Recent Labs  Lab 03/19/22 1202 03/19/22 2157 03/20/22 0304 03/20/22 0843 03/20/22 1535 03/21/22 0253 03/21/22 1148 03/22/22 0247 03/22/22 1634 03/23/22 0250  WBC 3.6*   < > 1.3*  --  1.0* 1.2*  --  1.2*  --  1.0*  NEUTROABS 2.3  --   --   --  0.6*  --   --   --   --   --   HGB 6.4*   < > 6.1*   < > 7.2* 6.9* 7.8* 7.7*  --  7.9*  HCT 21.0*   < > 18.8*   < > 22.4* 21.3* 23.5* 23.6*  --  23.9*  MCV 96.3   < > 91.7  --  93.3 92.2  --  92.5  --  92.6  PLT 31*   < > 23*  --  30* 27*  --  18* 15* 21*   < > = values in this interval not displayed.     Basic Metabolic Panel: Recent Labs  Lab 03/19/22 2157 03/20/22 0304 03/21/22 0253 03/22/22 0247 03/23/22 0250  NA 139 138 140 139 136  K 5.3* 4.5 3.8 3.3* 3.2*  CL 112* 112* 112* 111 109  CO2 9* 11* 12* 14* 14*  GLUCOSE 139* 150* 130* 124* 125*  BUN 89* 89* 92* 98* 107*  CREATININE 8.49* 8.35* 7.88* 7.83* 7.36*  CALCIUM 7.3* 6.9* 6.6* 6.9* 7.2*  PHOS  --   --  7.1* 6.6* 5.5*    GFR: Estimated Creatinine Clearance: 7.8 mL/min (A) (by C-G formula based on SCr of 7.36 mg/dL (H)). Liver Function Tests: Recent Labs  Lab 03/19/22 1202 03/21/22 0253 03/22/22 0247 03/23/22 0250  AST 22  --   --   --   ALT 9  --   --   --   ALKPHOS 59  --   --   --   BILITOT 0.7  --   --   --   PROT 7.7  --   --   --   ALBUMIN 3.4* 2.7* 2.2* 2.5*    Recent Labs  Lab 03/19/22 1202  LIPASE 83*    Recent Labs  Lab 03/19/22 1400 03/19/22 1524  LATICACIDVEN 3.9* 3.3*     Recent Results (from the past 240 hour(s))  Resp panel by RT-PCR (RSV, Flu A&B, Covid) Anterior Nasal Swab     Status: None   Collection Time: 03/19/22 12:02 PM   Specimen: Anterior Nasal Swab  Result Value Ref Range Status   SARS Coronavirus 2 by RT PCR NEGATIVE NEGATIVE Final    Comment: (NOTE) SARS-CoV-2 target nucleic acids are NOT DETECTED.  The SARS-CoV-2 RNA is generally detectable in upper respiratory specimens during the acute phase of infection. The lowest concentration of SARS-CoV-2  viral copies this assay can detect is 138 copies/mL. A negative result does not preclude SARS-Cov-2 infection and should not be used as the sole basis for treatment or other patient management decisions. A negative result may occur with  improper specimen collection/handling, submission of specimen other than nasopharyngeal swab, presence of viral mutation(s) within the areas targeted by this assay, and inadequate number of viral copies(<138 copies/mL). A negative result must be combined with clinical observations,  patient history, and epidemiological information. The expected result is Negative.  Fact Sheet for Patients:  EntrepreneurPulse.com.au  Fact Sheet for Healthcare Providers:  IncredibleEmployment.be  This test is no t yet approved or cleared by the [place] FDA and  has been authorized for detection and/or diagnosis of SARS-CoV-2 by FDA under an Emergency Use Authorization (EUA). This EUA will remain  in effect (meaning this test can be used) for the duration of the COVID-19 declaration under Section 564(b)(1) of the Act, 21 U.S.C.section 360bbb-3(b)(1), unless the authorization is terminated  or revoked sooner.       Influenza A by PCR NEGATIVE NEGATIVE Final   Influenza B by PCR NEGATIVE NEGATIVE Final    Comment: (NOTE) The Xpert Xpress SARS-CoV-2/FLU/RSV plus assay is intended as an aid in the diagnosis of influenza from Nasopharyngeal swab specimens and should not be used as a sole basis for treatment. Nasal washings and aspirates are unacceptable for Xpert Xpress SARS-CoV-2/FLU/RSV testing.  Fact Sheet for Patients: EntrepreneurPulse.com.au  Fact Sheet for Healthcare Providers: IncredibleEmployment.be  This test is not yet approved or cleared by the [place] FDA and has been authorized for detection and/or diagnosis of SARS-CoV-2 by FDA under an Emergency Use Authorization (EUA). This EUA will remain in effect (meaning this test can be used) for the duration of the COVID-19 declaration under Section 564(b)(1) of the Act, 21 U.S.C. section 360bbb-3(b)(1), unless the authorization is terminated or revoked.     Resp Syncytial Virus by PCR NEGATIVE NEGATIVE Final    Comment: (NOTE) Fact Sheet for Patients: EntrepreneurPulse.com.au  Fact Sheet for Healthcare Providers: IncredibleEmployment.be  This test is not yet approved or cleared by the [place] FDA and has been authorized for detection and/or diagnosis of SARS-CoV-2 by FDA under an Emergency Use Authorization (EUA). This EUA will remain in effect (meaning this test can be used) for the duration of the COVID-19 declaration under Section 564(b)(1) of the Act, 21 U.S.C. section 360bbb-3(b)(1), unless the authorization is terminated or revoked.  Performed at [facility], [place] [address] [place], [place] [address]   Culture, blood (routine x 2)     Status: None (Preliminary result)   Collection Time: 03/19/22  1:29 PM   Specimen: BLOOD  Result Value Ref Range Status   Specimen Description   Final    BLOOD LEFT ANTECUBITAL Performed at [facility] [address], [place], [place] [address]    Special Requests   Final    BOTTLES DRAWN AEROBIC AND ANAEROBIC Blood Culture adequate volume Performed at [facility] [address], [place], [place] [address]    Culture   Final    NO GROWTH 4 DAYS Performed at [facility], [place] [address] [place], [place] [address]    Report Status PENDING  Incomplete  Culture, blood (routine x 2)     Status: Abnormal (Preliminary result)   Collection Time: 03/19/22  2:00 PM   Specimen: BLOOD  Result Value Ref Range Status   Specimen Description  Final    BLOOD RIGHT ANTECUBITAL Performed at [facility] [address], [place], [place] [address]    Special Requests   Final    BOTTLES DRAWN AEROBIC AND ANAEROBIC Blood Culture results may not be optimal due to an excessive volume of blood received in culture bottles Performed at [facility] [address] [place], [place] [address]    Culture  Setup Time   Final    GRAM NEGATIVE RODS ANAEROBIC BOTTLE ONLY CRITICAL RESULT CALLED TO, READ BACK BY AND VERIFIED WITH:  C/ PHARMD M. BELL 03/20/22 1342 A. LAFRANCE    Culture (A)  Final    PSEUDOMONAS AERUGINOSA CULTURE REINCUBATED  FOR BETTER GROWTH Performed at [facility], [place] [address], [place], [place] [address]    Report Status PENDING  Incomplete   Organism ID, Bacteria PSEUDOMONAS AERUGINOSA  Final      Susceptibility   Pseudomonas aeruginosa - MIC*    CEFTAZIDIME 2 SENSITIVE Sensitive     CIPROFLOXACIN >=4 RESISTANT Resistant     GENTAMICIN <=1 SENSITIVE Sensitive     IMIPENEM 1 SENSITIVE Sensitive     PIP/TAZO 8 SENSITIVE Sensitive     * PSEUDOMONAS AERUGINOSA  Blood Culture ID Panel (Reflexed)     Status: Abnormal   Collection Time: 03/19/22  2:00 PM  Result Value Ref Range Status   Enterococcus faecalis NOT DETECTED NOT DETECTED Final   Enterococcus Faecium NOT DETECTED NOT DETECTED Final   Listeria monocytogenes NOT DETECTED NOT DETECTED Final   Staphylococcus species NOT DETECTED NOT DETECTED Final   Staphylococcus aureus (BCID) NOT DETECTED NOT DETECTED Final   Staphylococcus epidermidis NOT DETECTED NOT DETECTED Final   Staphylococcus lugdunensis NOT DETECTED NOT DETECTED Final   Streptococcus species NOT DETECTED NOT DETECTED Final   Streptococcus agalactiae NOT DETECTED NOT DETECTED Final   Streptococcus pneumoniae NOT DETECTED NOT DETECTED Final   Streptococcus pyogenes NOT DETECTED NOT DETECTED Final   A.calcoaceticus-baumannii NOT DETECTED NOT DETECTED Final   Bacteroides fragilis NOT DETECTED NOT DETECTED Final   Enterobacterales NOT DETECTED NOT DETECTED Final   Enterobacter cloacae complex NOT DETECTED NOT DETECTED Final   Escherichia coli NOT DETECTED NOT DETECTED Final   Klebsiella aerogenes NOT DETECTED NOT DETECTED Final   Klebsiella oxytoca NOT DETECTED NOT DETECTED Final   Klebsiella pneumoniae NOT DETECTED NOT DETECTED Final   Proteus species NOT DETECTED NOT DETECTED Final   Salmonella species NOT DETECTED NOT DETECTED Final   Serratia marcescens NOT DETECTED NOT DETECTED Final   Haemophilus influenzae NOT DETECTED NOT DETECTED Final   Neisseria meningitidis NOT  DETECTED NOT DETECTED Final   Pseudomonas aeruginosa DETECTED (A) NOT DETECTED Final    Comment: CRITICAL RESULT CALLED TO, READ BACK BY AND VERIFIED WITH:  C/ PHARMD M. BELL 03/20/22 1342 A. LAFRANCE    Stenotrophomonas maltophilia NOT DETECTED NOT DETECTED Final   Candida albicans NOT DETECTED NOT DETECTED Final   Candida auris NOT DETECTED NOT DETECTED Final   Candida glabrata NOT DETECTED NOT DETECTED Final   Candida krusei NOT DETECTED NOT DETECTED Final   Candida parapsilosis NOT DETECTED NOT DETECTED Final   Candida tropicalis NOT DETECTED NOT DETECTED Final   Cryptococcus neoformans/gattii NOT DETECTED NOT DETECTED Final   CTX-M ESBL NOT DETECTED NOT DETECTED Final   Carbapenem resistance IMP NOT DETECTED NOT DETECTED Final   Carbapenem resistance KPC NOT DETECTED NOT DETECTED Final   Carbapenem resistance NDM NOT DETECTED NOT DETECTED Final  Carbapenem resistance VIM NOT DETECTED NOT DETECTED Final    Comment: Performed at [facility], [place] [address] [place], [place] [address]  MRSA Next Gen by PCR, Nasal     Status: None   Collection Time: 03/19/22  6:58 PM   Specimen: Nasal Mucosa; Nasal Swab  Result Value Ref Range Status   MRSA by PCR Next Gen NOT DETECTED NOT DETECTED Final    Comment: (NOTE) The GeneXpert MRSA Assay (FDA approved for NASAL specimens only), is one component of a comprehensive MRSA colonization surveillance program. It is not intended to diagnose MRSA infection nor to guide or monitor treatment for MRSA infections. Test performance is not FDA approved in patients less than 67 years old. Performed at [facility], [place] [address], [place], [place] [address]   Urine Culture     Status: Abnormal   Collection Time: 03/20/22 12:32 AM   Specimen: Urine, Clean Catch  Result Value Ref Range Status   Specimen Description   Final    URINE, CLEAN CATCH Performed at [facility], [place] [address], [place],  [place] [address]    Special Requests   Final    NONE Performed at [facility], [place] [address], [place], [place] [address]    Culture (A)  Final    <10,000 COLONIES/mL INSIGNIFICANT GROWTH Performed at [facility] [address], [place], [place] [address]    Report Status 03/21/2022 FINAL  Final  C Difficile Quick Screen w PCR reflex     Status: None   Collection Time: 03/20/22  6:46 PM   Specimen: STOOL  Result Value Ref Range Status   C Diff antigen NEGATIVE NEGATIVE Final   C Diff toxin NEGATIVE NEGATIVE Final   C Diff interpretation No C. difficile detected.  Final    Comment: Performed at [facility], [place] [address], [place], [place] [address]    Antimicrobials: Anti-infectives (From admission, onward)    Start     Dose/Rate Route Frequency Ordered Stop   03/23/22 1000  meropenem (MERREM) 500 mg in sodium chloride 0.9 % 100 mL IVPB        500 mg 200 mL/hr over 30 Minutes Intravenous Every 24 hours 03/22/22 1207     03/22/22 1300  meropenem (MERREM) 1 g in sodium chloride 0.9 % 100 mL IVPB        1 g 200 mL/hr over 30 Minutes Intravenous  Once 03/22/22 1207 03/22/22 1430   03/21/22 1000  doxycycline (VIBRA-TABS) tablet 100 mg        100 mg Oral Every 12 hours 03/21/22 0828     03/21/22 0845  vancomycin (VANCOCIN) IVPB 1000 mg/200 mL premix  Status:  Discontinued        1,000 mg 200 mL/hr over 60 Minutes Intravenous  Once 03/21/22 0755 03/21/22 0828   03/20/22 1400  ceFEPIme (MAXIPIME) 1 g in sodium chloride 0.9 % 100 mL IVPB  Status:  Discontinued        1 g 200 mL/hr over 30 Minutes Intravenous Every 24 hours 03/19/22 1719 03/22/22 1207   03/19/22 1719  vancomycin variable dose per unstable renal function (pharmacist dosing)  Status:  Discontinued         Does not apply See admin instructions 03/19/22 1719 03/21/22 0828   03/19/22 1400  vancomycin (VANCOCIN) IVPB 1000 mg/200 mL premix        1,000 mg 200 mL/hr over 60 Minutes  Intravenous  Once 03/19/22 1348 03/19/22 1628   03/19/22 1400  ceFEPIme (MAXIPIME) 2 g in sodium chloride 0.9 % 100 mL IVPB        2 g 200 mL/hr over 30 Minutes Intravenous  Once 03/19/22 1348 03/19/22 1511      Culture/Microbiology    Component Value Date/Time   SDES  03/20/2022 0032    URINE, CLEAN CATCH Performed at [facility], [place] [address] [place], [place] [address]    SPECREQUEST  03/20/2022 0032    NONE Performed at [facility], [place] [address] [place], [place] [address]    CULT (A) 03/20/2022 0032    <10,000 COLONIES/mL INSIGNIFICANT GROWTH Performed at [facility] [address] [place], [place] [address]    REPTSTATUS 03/21/2022 FINAL 03/20/2022 0032  Radiology Studies: NM Bone Scan 3 Phase  Result Date: 03/22/2022 CLINICAL DATA:  RIGHT hip prosthesis.  Concern for joint infection. EXAM: NUCLEAR MEDICINE 3-PHASE BONE SCAN TECHNIQUE: Radionuclide angiographic images, immediate static blood pool images, and 3-hour delayed static images were obtained of the hips after intravenous injection of radiopharmaceutical. RADIOPHARMACEUTICALS:  21.1 mCi Tc-31mMDP IV COMPARISON:  CT 03/20/2022, PET-CT 03/09/2022 FINDINGS: Vascular phase: No asymmetric or increased blood flow to the LEFT or RIGHT hip. Blood pool phase: No abnormal blood pool activity within LEFT RIGHT hip. Photopenia noted associated with the RIGHT hip prosthetic. Delayed phase: Delayed phase imaging demonstrates no significant abnormal increased activity associated the RIGHT hip prosthetic. IMPRESSION: No evidence infection the RIGHT hip prosthetic Electronically Signed   By: SSuzy BouchardM.D.   On: 03/22/2022 17:09   UK[place]PELVIS (TRANSABDOMINAL ONLY)  Result Date: 03/21/2022 CLINICAL DATA:  57year old woman with history of vulvar carcinoma and chronic pelvic osteomyelitis. Chronic thickening of right labial soft tissues. EXAM: LIMITED ULTRASOUND OF PELVIS  TECHNIQUE: Limited transabdominal ultrasound examination of the pelvis was performed. COMPARISON:  CT abdomen pelvis 03/20/2022 FINDINGS: Targeted sonographic evaluation of the area of concern in the right labial region demonstrates soft tissue thickening without discrete fluid collection. IMPRESSION: Sonographic evaluation of the area of concern in the right labia demonstrates soft tissue thickening similar to prior CT scans dating back to 04/03/2020. This may be due to chronic inflammation or postsurgical change. No discrete fluid collection identified to indicate abscess. Electronically Signed   By: FMiachel RouxM.D.   On: 03/21/2022 14:08     LOS: 4 days   KBarb Merino MD [facility]  03/23/2022, 10:30 AM

## 2022-03-23 NOTE — Progress Notes (Signed)
Pharmacy Antibiotic Note  Christina Mack is a 56 y.o. female admitted on 03/19/2022 with pelvic osteomyelitis.  Pharmacy has been consulted for Daptomycin dosing.  Patient with pseudomonas bacteremia receiving meropenem.  She has PMH of pelvic osteomyelitis being managed with po antibiotics for patient preference to avoid IV.  Currently does not have a good po option for antibiotics to treat pseudomonas bacteremia and pelvic osteomyelitis  Today, 03/23/2022 CK = 15 Low WBC and platelets (suspected to be from linezolid myelosuppression) Renal: PMH of renal transplant - no HD at this time  Plan: Daptomycin '500mg'$  ('8mg'$ /kg) IV q48h  Check CK weekly Continue meropenem '500mg'$  IV q24h See OPAT orders Need IV Line for long-term IV antibiotic - follow-up type of line d/t CKD  Height: '5\' 5"'$  (165.1 cm) Weight: 61.3 kg (135 lb 2.3 oz) IBW/kg (Calculated) : 57  Temp (24hrs), Avg:98 F (36.7 C), Min:97.5 F (36.4 C), Max:98.7 F (37.1 C)  Recent Labs  Lab 03/19/22 1400 03/19/22 1524 03/19/22 2157 03/20/22 0304 03/20/22 1535 03/21/22 0253 03/22/22 0247 03/23/22 0250  WBC  --   --  1.5* 1.3* 1.0* 1.2* 1.2* 1.0*  CREATININE  --   --  8.49* 8.35*  --  7.88* 7.83* 7.36*  LATICACIDVEN 3.9* 3.3*  --   --   --   --   --   --   VANCORANDOM  --   --   --   --   --  13  --   --     Estimated Creatinine Clearance: 7.8 mL/min (A) (by C-G formula based on SCr of 7.36 mg/dL (H)).    Allergies  Allergen Reactions   Oxycodone-Acetaminophen Itching, Nausea And Vomiting, Nausea Only, Rash and Other (See Comments)    Headaches, also    Piperacillin-Tazobactam In Dex     Other Reaction(s): Other (See Comments)  Thrombocytopenia  Thrombocytopenia   Zosyn [Piperacillin Sod-Tazobactam So] Other (See Comments)    Thrombocytopenia   Hydromorphone Nausea And Vomiting, Rash and Other (See Comments)   Amlodipine Other (See Comments) and Swelling    Ankle swelling   Codeine     vomiting     Antimicrobials this admission: PTA linezolid/moxifloxacin >> 12/15 (OP abx) 12/17 Cefepime >>12/19 12/17 Vancomycin >> 12/19 12/19 doxy >>12/21 12/21 daptomycin >> 12/20 mero >>   Dose adjustments this admission:   Microbiology results: 12/17 BCx: pseudomonas - resistant to ciprofloxacin,  Cefepime not reported due to MIC (needs confirmatory test) 12/17 UCx:  <10K 12/17 Resp: Covid neg; Influenza neg; RSV neg 12/17 MRSA PCR: not detected Thank you for allowing pharmacy to be a part of this patient's care.  Doreene Eland, PharmD, BCPS, BCIDP Work Cell: [phone] 03/23/2022 4:26 PM

## 2022-03-23 NOTE — Progress Notes (Signed)
Daily Progress Note   Patient Name: Christina Mack       Date: 03/23/2022 DOB: [birth year]  Age: 56 y.o. MRN#: [id number] Attending Physician: Barb Merino, MD Primary Care Physician: System, Provider Not In Admit Date: 03/19/2022 Length of Stay: 4 days  Reason for Consultation/Follow-up: Establishing goals of care  Subjective:   CC: Patient hoping to go home tomorrow or the next day is platelets improve enough. Following up regarding complex medical decision making.   Subjective:  Reviewed EMR prior to seeing patient.  Patient's creatinine continues to slowly decrease.  Nephrology following and noted that patient has stated she would like to wait to be seen by [facility] transplant and Dr. Joelyn Oms on 04/08/2022 before agreeing to start HD.  Patient's platelets slightly increased today.  No active bleeding noted. Discussed care with bedside RN prior to seeing patient for updates.  Presented to bedside to check on patient today.  Patient notes only concern today is having some back pain from laying in the bed though she is receiving Tylenol which she takes at home for management.  Patient willing to be on scheduled Tylenol to assist with management.  Noted would make sure this is scheduled for her. Expressed how grateful she was to work with PT and how she is making progress.  Patient is to continue work with PT so that she can get out of the hospital and continue her plans to follow-up at [facility].  Patient noted she discussed with hospitalist wanting to go home tomorrow or the next day if her platelets are high enough to do so.  Patient continues to easily express her wishes for medical care to all providers involved.  Explained to patient that this provider is not present tomorrow and so she could inform anyone if further care needs from palliative medicine team.  Review of Systems Back pain from being in the bed.  Objective:   Vital Signs:  BP (!) 140/75   Pulse 70   Temp 98.7 F (37.1 C)  (Oral)   Resp 19   Ht '5\' 5"'$  (1.651 m)   Wt 61.3 kg   SpO2 98%   BMI 22.49 kg/m   Physical Exam: General: NAD, alert, laying in bed chronically ill-appearing, pleasant  Eyes: conjunctiva clear, anicteric sclera HENT:  moist mucous membranes Cardiovascular: RRR Respiratory: no increased work of breathing noted, not in respiratory distress Abdomen: not distended Extremities: Moving upper extremities easily Skin: Reviewed wound pictures in EMR Neuro: A&Ox4, following commands easily Psych: appropriately answers all questions  Imaging:  I personally reviewed recent imaging.   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  During hospital admission patient was found to have severe hyperkalemia, pancytopenia, and lactic acidosis and was admitted to the ICU for management.  Patient receiving management for Pseudomonas bacteremia with known open groin wound, AKI on CKD 5, urinary retention, acute blood loss anemia, and pressure ulcer that was present upon admission.  Patient being seen by hematology, nephrology, urology, and infectious disease.  Palliative medicine team consulted to assist with complex medical decision making.   Recommendations/Plan: # Complex medical decision making/goals of care                -Patient remains focused on  getting to [facility] for follow-up with specialist.  Patient hopeful for discharge as soon as possible once platelets stable. Continues to work with PT to regain strength.                 -  Code Status: Full Code    # Symptom management                -Pain, acute on chronic. Patient describes symptoms of musculoskeletal pain.    -Scheduled Tylenol '1000mg'$  TID. Patient  agreeing to this.   # Psychosocial Support:  -husband, daughter  # Discharge Planning: home when able to follow up at [facility] when apt scheduled   Discussed with: patient, bedside RN  Thank you for allowing the palliative care team to participate in the care [name]. Patient has been very clear about her wishes for medical care with all providers involved. As goals for medical care are currently determined, palliative medicine team will sign off. Please reach out if PMT needs arise. Thank you.   Chelsea Aus, DO Palliative Care Provider PMT # [phone]  This provider spent a total of 36 minutes providing patient's care.  Includes review of EMR, discussing care with other staff members involved in patient's medical care, obtaining relevant history and information from patient and/or patient's family, and personal review of imaging and lab work. Greater than 50% of the time was spent counseling and coordinating care related to the above assessment and plan.

## 2022-03-23 NOTE — Progress Notes (Addendum)
Secure chat with ID and Nephrology MDs regarding PICC order. Patient's GFR is 5 and CKD. Per Laurice Record MD, will defer to Nephrology. Awaiting for Nephrologist response.   Coladonato MD from Nephrology stated "no PICC please". ID MD to D/C PICC order.

## 2022-03-23 NOTE — Plan of Care (Signed)

## 2022-03-23 NOTE — Evaluation (Signed)
Physical Therapy Evaluation Patient Details Name: Christina Mack MRN: [id number] DOB: 1965/12/31 Today's Date: 03/23/2022  History of Present Illness  61yof w/hx vulvar CA s/p chemo/XRT complicated by chronic R groin/inguinal open wound with known osteomyelitis presented with generalized weakness.  Patient found to have severe hyperkalemia pancytopenia lactic acidosis FOBT positive admitted to ICU. Known history of renal transplant on immuno suppression. Underwent R THA and ORIF of tibial plateau on 10/22.  Clinical Impression  Pt admitted with above diagnosis.  Pt currently with functional limitations due to the deficits listed below (see PT Problem List). Pt will benefit from skilled PT to increase their independence and safety with mobility to allow discharge to the venue listed below.     The patient  appears in distress, reporting Left leg pain, frequently repositioning. Patient assisted to [facility] then to recliner, difficulty getting  positioned due to pain. Assisted to stand again, placed hot pads on back and  thigh. RN aware of pain complaints.   Patient should progress to return home.     Recommendations for follow up therapy are one component of a multi-disciplinary discharge planning process, led by the attending physician.  Recommendations may be updated based on patient status, additional functional criteria and insurance authorization.  Follow Up Recommendations Home health PT      Assistance Recommended at Discharge  Intermittent.  Patient can return home with the following  A little help with walking and/or transfers;A little help with bathing/dressing/bathroom;Help with stairs or ramp for entrance;Assistance with cooking/housework;Assist for transportation    Equipment Recommendations None recommended by PT  Recommendations for Other Services       Functional Status Assessment Patient has had a recent decline in their functional status and demonstrates the ability to make  significant improvements in function in a reasonable and predictable amount of time.     Precautions / Restrictions Precautions Precautions: Fall Precaution Comments: nose bleeds (low plt),      Mobility  Bed Mobility Overal bed mobility: Needs Assistance Bed Mobility: Supine to Sit     Supine to sit: Supervision, HOB elevated     General bed mobility comments: extra time, reporting severe pain in left leg    Transfers Overall transfer level: Needs assistance Equipment used: Rolling walker (2 wheels) Transfers: Sit to/from Stand, Bed to chair/wheelchair/BSC Sit to Stand: Min assist           General transfer comment: decreased WB on the left leg, transfer to [facility] then to recliner .Stood again from recliner to attempt to alleviate  pain.    Ambulation/Gait                  Stairs            Wheelchair Mobility    Modified Rankin (Stroke Patients Only)       Balance Overall balance assessment: Mild deficits observed, not formally tested                                           Pertinent Vitals/Pain Pain Assessment Pain Score: (P) 10-Worst pain ever Faces Pain Scale: Hurts worst Pain Location: left thigh and left buttock Pain Descriptors / Indicators: Aching, Grimacing, Moaning, Guarding, Crying Pain Intervention(s): Monitored during session, Relaxation, Heat applied, Patient requesting pain meds-RN notified    Home Living Family/patient expects to be discharged to:: Private residence Living Arrangements: Spouse/significant other  Available Help at Discharge: Family;Available 24 hours/day Type of Home: House Home Access: Stairs to enter Entrance Stairs-Rails: Right;Left Entrance Stairs-Number of Steps: 3-4 at front vs back Alternate Level Stairs-Number of Steps: flight Home Layout: Two level;Able to live on main level with bedroom/bathroom Home Equipment: Rolling Walker (2 wheels);Tub bench Additional Comments: can stay  on first floor, tub/shower w/ bench on first floor and walk in shower on second floor    Prior Function Prior Level of Function : Independent/Modified Independent;Working/employed;Driving             Mobility Comments: uses a cane ADLs Comments: works as a Engineer, 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 Independent wtih ADLS     Hand Dominance   Dominant Hand: Right    Extremity/Trunk Assessment                Communication   Communication: No difficulties  Cognition Arousal/Alertness: Awake/alert Behavior During Therapy: WFL for tasks assessed/performed, Restless, Anxious Overall Cognitive Status: Within Functional Limits for tasks assessed                                 General Comments: patient reporting pain is excruciating, tried to reporsition several times, placed heat packs.Recommend heatuing pad at bedside.        General Comments      Exercises     Assessment/Plan    PT Assessment Patient needs continued PT services  PT Problem List         PT Treatment Interventions      PT Goals (Current goals can be found in the Care Plan section)  Acute Rehab PT Goals Patient Stated Goal: walk, no pain PT Goal Formulation: With patient Time For Goal Achievement: 04/06/22 Potential to Achieve Goals: Good    Frequency Min 3X/week     Co-evaluation               AM-PAC PT "6 Clicks" Mobility  Outcome Measure Help needed turning from your back to your side while in a flat bed without using bedrails?: A Little Help needed moving from lying on your back to sitting on the side of a flat bed without using bedrails?: A Little Help needed moving to and from a bed to a chair (including a wheelchair)?: A Little Help needed standing up from a chair using your arms (e.g., wheelchair or bedside chair)?: A Little Help needed to walk in hospital room?: Total Help needed climbing 3-5 steps with a railing? : Total 6 Click  Score: 14    End of Session   Activity Tolerance: Patient limited by pain Patient left: in chair;with nursing/sitter in room Nurse Communication: Mobility status;Patient requests pain meds PT Visit Diagnosis: Unsteadiness on feet (R26.81);Pain;Difficulty in walking, not elsewhere classified (R26.2) Pain - Right/Left: Left Pain - part of body: Leg    Time: 2671-2458 PT Time Calculation (min) (ACUTE ONLY): 42 min   Charges:   PT Evaluation $PT Eval Low Complexity: 1 Low PT Treatments $Therapeutic Activity: 8-22 mins $Self Care/Home Management: [place] Office [phone] Weekend pager-[phone]   Claretha Cooper 03/23/2022, 12:43 PM

## 2022-03-24 ENCOUNTER — Inpatient Hospital Stay (HOSPITAL_COMMUNITY)

## 2022-03-24 DIAGNOSIS — N179 Acute kidney failure, unspecified: Secondary | ICD-10-CM | POA: Diagnosis not present

## 2022-03-24 HISTORY — PX: IR FLUORO GUIDE CV LINE RIGHT: IMG2283

## 2022-03-24 HISTORY — PX: IR PERC TUN PERIT CATH WO PORT S&I /IMAG: IMG2327

## 2022-03-24 HISTORY — PX: IR US GUIDE VASC ACCESS RIGHT: IMG2390

## 2022-03-24 LAB — FOLATE RBC
Folate, Hemolysate: 282 ng/mL
Folate, RBC: 1231 ng/mL (ref >498)
Hematocrit: 22.9 % — ABNORMAL LOW (ref 34.0–46.6)

## 2022-03-24 LAB — RENAL FUNCTION PANEL
Albumin: 2.6 g/dL — ABNORMAL LOW (ref 3.5–5.0)
Anion gap: 12 (ref 5–15)
BUN: 113 mg/dL — ABNORMAL HIGH (ref 6–20)
CO2: 13 mmol/L — ABNORMAL LOW (ref 22–32)
Calcium: 7.4 mg/dL — ABNORMAL LOW (ref 8.9–10.3)
Chloride: 109 mmol/L (ref 98–111)
Creatinine, Ser: 6.97 mg/dL — ABNORMAL HIGH (ref 0.44–1.00)
GFR, Estimated: 6 mL/min — ABNORMAL LOW (ref >60)
Glucose, Bld: 106 mg/dL — ABNORMAL HIGH (ref 70–99)
Phosphorus: 4 mg/dL (ref 2.5–4.6)
Potassium: 3 mmol/L — ABNORMAL LOW (ref 3.5–5.1)
Sodium: 134 mmol/L — ABNORMAL LOW (ref 135–145)

## 2022-03-24 LAB — CULTURE, BLOOD (ROUTINE X 2)
Culture: NO GROWTH
Special Requests: ADEQUATE

## 2022-03-24 LAB — CBC
HCT: 22.3 % — ABNORMAL LOW (ref 36.0–46.0)
Hemoglobin: 7.2 g/dL — ABNORMAL LOW (ref 12.0–15.0)
MCH: 30.6 pg (ref 26.0–34.0)
MCHC: 32.3 g/dL (ref 30.0–36.0)
MCV: 94.9 fL (ref 80.0–100.0)
Platelets: 22 10*3/uL — CL (ref 150–400)
RBC: 2.35 MIL/uL — ABNORMAL LOW (ref 3.87–5.11)
RDW: 17.1 % — ABNORMAL HIGH (ref 11.5–15.5)
WBC: 1.9 10*3/uL — ABNORMAL LOW (ref 4.0–10.5)
nRBC: 0 % (ref 0.0–0.2)

## 2022-03-24 MED ORDER — FENTANYL CITRATE (PF) 100 MCG/2ML IJ SOLN
INTRAMUSCULAR | Status: AC
  Filled 2022-03-24: qty 2

## 2022-03-24 MED ORDER — LIDOCAINE-EPINEPHRINE 1 %-1:100000 IJ SOLN
INTRAMUSCULAR | Status: AC | PRN
  Administered 2022-03-24: 10 mL

## 2022-03-24 MED ORDER — DIPHENHYDRAMINE HCL 50 MG/ML IJ SOLN: 50.0000 mg | INTRAMUSCULAR | Status: AC

## 2022-03-24 MED ORDER — DAPTOMYCIN IV (FOR PTA / DISCHARGE USE ONLY): 500.0000 mg | INTRAVENOUS | 0 refills | Status: AC

## 2022-03-24 MED ORDER — SODIUM CHLORIDE 0.9 % IV SOLN: INTRAVENOUS | Status: DC

## 2022-03-24 MED ORDER — DIPHENHYDRAMINE HCL 50 MG/ML IJ SOLN
INTRAMUSCULAR | Status: AC | PRN
  Administered 2022-03-24: 25 mg via INTRAVENOUS

## 2022-03-24 MED ORDER — LIDOCAINE-EPINEPHRINE 1 %-1:100000 IJ SOLN
INTRAMUSCULAR | Status: AC
  Filled 2022-03-24: qty 1

## 2022-03-24 MED ORDER — FENTANYL CITRATE (PF) 100 MCG/2ML IJ SOLN
INTRAMUSCULAR | Status: AC | PRN
  Administered 2022-03-24 (×2): 50 ug via INTRAVENOUS

## 2022-03-24 MED ORDER — DAPTOMYCIN IV (FOR PTA / DISCHARGE USE ONLY): 500.0000 mg | INTRAVENOUS | 0 refills | Status: DC

## 2022-03-24 MED ORDER — MEROPENEM IV (FOR PTA / DISCHARGE USE ONLY): 500.0000 mg | INTRAVENOUS | 0 refills | Status: AC

## 2022-03-24 MED ORDER — HYDROCORTISONE SOD SUC (PF) 100 MG IJ SOLR
50.0000 mg | Freq: Every day | INTRAMUSCULAR | Status: DC
  Administered 2022-03-25: 50 mg via INTRAVENOUS
  Filled 2022-03-24: qty 2

## 2022-03-24 MED ORDER — DIPHENHYDRAMINE HCL 50 MG/ML IJ SOLN
INTRAMUSCULAR | Status: AC
  Filled 2022-03-24: qty 1

## 2022-03-24 MED ORDER — MEROPENEM IV (FOR PTA / DISCHARGE USE ONLY): 500.0000 mg | INTRAVENOUS | 0 refills | Status: DC

## 2022-03-24 NOTE — Procedures (Signed)
Pre procedural Diagnosis: Poor venous access Post Procedural Diagnosis: Same  Successful placement of right IJ approach dual lumen CVC with tip at the superior caval-atrial junction.    EBL: Trace No immediate post procedural complication.  The CVC is ready for immediate use.  Ronny Bacon, MD Pager #: [phone]

## 2022-03-24 NOTE — Progress Notes (Signed)
Patient ID: Christina Mack, female   DOB: [birth year], 56 y.o.   MRN: [id number] S: Pt was in IR for central line access O:BP (!) 154/86   Pulse 81   Temp 99.1 F (37.3 C) (Oral)   Resp 14   Ht _0  (1.651 m)   Wt 65.4 kg   SpO2 99%   BMI 23.99 kg/m   Intake/Output Summary (Last 24 hours) at 03/24/2022 1138 Last data filed at 03/24/2022 0506 Gross per 24 hour  Intake 800 ml  Output 650 ml  Net 150 ml   Intake/Output: I/O last 3 completed shifts: In: 7342 [P.O.:920; Blood:297; IV Piggyback:100] Out: 650 [Urine:650]  Intake/Output this shift:  No intake/output data recorded. Weight change: 4.1 kg   Recent Labs  Lab 03/19/22 1202 03/19/22 2157 03/20/22 0304 03/21/22 0253 03/22/22 0247 03/23/22 0250 03/24/22 0536  NA 136 139 138 140 139 136 134*  K 6.7* 5.3* 4.5 3.8 3.3* 3.2* 3.0*  CL 111 112* 112* 112* 111 109 109  CO2 <7* 9* 11* 12* 14* 14* 13*  GLUCOSE 99 139* 150* 130* 124* 125* 106*  BUN 93* 89* 89* 92* 98* 107* 113*  CREATININE 9.30* 8.49* 8.35* 7.88* 7.83* 7.36* 6.97*  ALBUMIN 3.4*  --   --  2.7* 2.2* 2.5* 2.6*  CALCIUM 7.9* 7.3* 6.9* 6.6* 6.9* 7.2* 7.4*  PHOS  --   --   --  7.1* 6.6* 5.5* 4.0  AST 22  --   --   --   --   --   --   ALT 9  --   --   --   --   --   --    Liver Function Tests: Recent Labs  Lab 03/19/22 1202 03/21/22 0253 03/22/22 0247 03/23/22 0250 03/24/22 0536  AST 22  --   --   --   --   ALT 9  --   --   --   --   ALKPHOS 59  --   --   --   --   BILITOT 0.7  --   --   --   --   PROT 7.7  --   --   --   --   ALBUMIN 3.4*   < > 2.2* 2.5* 2.6*   < > = values in this interval not displayed.   Recent Labs  Lab 03/19/22 1202  LIPASE 83*   No results for input(s): "AMMONIA" in the last 168 hours. CBC: Recent Labs  Lab 03/19/22 1202 03/19/22 2157 03/20/22 1535 03/21/22 0253 03/21/22 1148 03/22/22 0247 03/22/22 1634 03/23/22 0250 03/24/22 0536  WBC 3.6*   < > 1.0* 1.2*  --  1.2*  --  1.0* 1.9*  NEUTROABS 2.3  --  0.6*  --    --   --   --   --   --   HGB 6.4*   < > 7.2* 6.9*   < > 7.7*  --  7.9* 7.2*  HCT 21.0*   < > 22.4* 21.3*   < > 23.6*  --  23.9* 22.3*  MCV 96.3   < > 93.3 92.2  --  92.5  --  92.6 94.9  PLT 31*   < > 30* 27*  --  18* 15* 21* 22*   < > = values in this interval not displayed.   Cardiac Enzymes: Recent Labs  Lab 03/23/22 1428  CKTOTAL 15*   CBG: Recent Labs  Lab 03/19/22 1455  GLUCAP 82    Iron Studies:  Recent Labs    03/23/22 0250  FERRITIN 4,472*   Studies/Results: [place] EKG SITE RITE  Result Date: 03/23/2022 If Site Rite image not attached, placement could not be confirmed due to current cardiac rhythm.  NM Bone Scan 3 Phase  Result Date: 03/22/2022 CLINICAL DATA:  RIGHT hip prosthesis.  Concern for joint infection. EXAM: NUCLEAR MEDICINE 3-PHASE BONE SCAN TECHNIQUE: Radionuclide angiographic images, immediate static blood pool images, and 3-hour delayed static images were obtained of the hips after intravenous injection of radiopharmaceutical. RADIOPHARMACEUTICALS:  21.1 mCi Tc-18mMDP IV COMPARISON:  CT 03/20/2022, PET-CT 03/09/2022 FINDINGS: Vascular phase: No asymmetric or increased blood flow to the LEFT or RIGHT hip. Blood pool phase: No abnormal blood pool activity within LEFT RIGHT hip. Photopenia noted associated with the RIGHT hip prosthetic. Delayed phase: Delayed phase imaging demonstrates no significant abnormal increased activity associated the RIGHT hip prosthetic. IMPRESSION: No evidence infection the RIGHT hip prosthetic Electronically Signed   By: SSuzy BouchardM.D.   On: 03/22/2022 17:09    sodium chloride   Intravenous Once   sodium chloride   Intravenous Once   carvedilol  12.5 mg Oral BID WC   Chlorhexidine Gluconate Cloth  6 each Topical Daily   diphenhydrAMINE       diphenhydrAMINE  50 mg Intravenous On Call   fentaNYL       hydrocortisone sodium succinate  100 mg Intravenous Daily   lidocaine-EPINEPHrine       mouth rinse  15 mL Mouth Rinse 4  times per day   pantoprazole (PROTONIX) IV  40 mg Intravenous Q12H   potassium chloride  20 mEq Oral BID    BMET    Component Value Date/Time   NA 134 (L) 03/24/2022 0536   NA 139 11/26/2012 1758   K 3.0 (L) 03/24/2022 0536   K 4.6 11/26/2012 1758   CL 109 03/24/2022 0536   CL 105 11/26/2012 1758   CO2 13 (L) 03/24/2022 0536   CO2 22 11/26/2012 1758   GLUCOSE 106 (H) 03/24/2022 0536   GLUCOSE 88 11/26/2012 1758   BUN 113 (H) 03/24/2022 0536   BUN 33 (H) 11/26/2012 1758   CREATININE 6.97 (H) 03/24/2022 0536   CREATININE 2.93 (H) 06/08/2020 1544   CALCIUM 7.4 (L) 03/24/2022 0536   CALCIUM 9.7 11/26/2012 1758   GFRNONAA 6 (L) 03/24/2022 0536   GFRNONAA 19 (L) 11/26/2012 1758   GFRAA 22 (L) 11/26/2012 1758   CBC    Component Value Date/Time   WBC 1.9 (L) 03/24/2022 0536   RBC 2.35 (L) 03/24/2022 0536   HGB 7.2 (L) 03/24/2022 0536   HGB 10.5 (L) 11/26/2012 1758   HCT 22.3 (L) 03/24/2022 0536   HCT 31.4 (L) 11/26/2012 1758   PLT 22 (LL) 03/24/2022 0536   PLT 190 11/26/2012 1758   MCV 94.9 03/24/2022 0536   MCV 89 11/26/2012 1758   MCH 30.6 03/24/2022 0536   MCHC 32.3 03/24/2022 0536   RDW 17.1 (H) 03/24/2022 0536   RDW 13.2 11/26/2012 1758   LYMPHSABS 0.3 (L) 03/20/2022 1535   LYMPHSABS 2.4 11/26/2012 1758   MONOABS 0.1 03/20/2022 1535   MONOABS 0.7 11/26/2012 1758   EOSABS 0.0 03/20/2022 1535   EOSABS 0.1 11/26/2012 1758   BASOSABS 0.0 03/20/2022 1535   BASOSABS 0.0 11/26/2012 1758     Assessment/ Plan: AKI on CKD 5 - b/l creat 5.72- 7.10, from aug - nov 2023, eGFR 6- 8  ml/min. Creat 9.30 on admission.  AKI likely multifactorial with ischemic ATN in setting of several days of poor po intake and recurrent vomiting/ dry heaves as well as ABLA due to GI bleed. UA  with large blood and protein.  Transplant renal US with moderate hydronephrosis but normal resistive indices.  CXR neg, no vol excess on exam. She is  now getting bicarb gtt at 100 cc/hr. Would continue.  Suspect AKI due to n/v and vol depletion, vs progression to end-stage renal failure w/ uremia causing nausea/ vomiting. No neuro issues, will not proceed w/ dialysis yet. Place foley cath. IVF"s have improved BUN/Cr from admission but still with low UOP.  Her baseline function is very poor and it may be that we will need to start dialysis regardless of the treatments. I did discuss the eventual need to resume dialysis which she acknowledges, however despite a low eGFR, she had not had vascular access placed in preparation.  She was referred to [facility] for a third transplant, however is high risk due to h/o ongoing osteomyelitis, so don't think she will be eligible.  We discussed RRT and she understands that she will need to start HD.  Would place [facility] when her platelets are suitable/safe, however she would like to wait until she is seen by [facility] transplant and Dr. Joelyn Oms on 04/08/22 before agreeing to starting HD.  Currently without overt uremic symptoms.  Continue with supportive care until we can safely place [facility] to initiate HD.  BUN climbing likely due to diarrhea, may benefit from some gentle IVF"s.  Scr has slowly been improving.  Avoid nephrotoxic medications including NSAIDs and iodinated intravenous contrast exposure unless the latter is absolutely indicated.   Preferred narcotic agents for pain control are hydromorphone, fentanyl, and methadone. Morphine should not be used.  Avoid Baclofen and avoid oral sodium phosphate and magnesium citrate based laxatives / bowel preps.  Continue strict Input and Output monitoring. Will monitor the patient closely with you and intervene or adjust therapy as indicated by changes in clinical status/labs  Volume - looks quite dry on exam. IVF"s as above.  Acute blood loss anemia on Anemia of CKD stage 5- she was transfused 1 unit PRBC early this morning.  Had high tsat 95% last admission in Nov 2023, and ferritin high at 1352.  No need for IV Fe.  Required another  transfusion this morning.  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  Declined foley or suprapubic catheter placement.  Pancytopenia - plts 35K, WBC low. Per pmd.  Getting platelet transfusion due to GI bleed.   Renal transplant - in 2014. 2nd transplant. Cont to hold prograf given leukopenia. Pancytopenia - per CCM they suspect this could be due to her prolonged zyvox exposure.  Continue to transfuse platelets and PRBC's as needed.  Consider Heme evaluation.  ID - some signs of sepsis like tachypnea and tachycardia. Started on IV vanc and cefepime. Blood / urine cx's ordered and pending.   Now off of vanco and on doxycycline and cefepime. 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 Had been on zyvox and moxifloxacin po. Now on doxy and cefepime. SP recent R THA and tibial plateau fracture Oct 2023  Donetta Potts, MD [facility]

## 2022-03-24 NOTE — Progress Notes (Signed)
PROGRESS NOTE    Christina Mack  GUR:[id number] DOB: October 14, 1965 DOA: 03/19/2022 PCP: System, Provider Not In     Brief Narrative:  Christina Mack is a [phone] w/hx vulvar CA s/p chemo/XRT complicated by chronic R groin/inguinal open wound with known osteomyelitis, supposed to be seeing ortho onc at [facility] at some point, followed by ID here for chronic wound, has been on Linezolid for a few months presented with generalized weakness and decreased oral intake x 3 days.  Patient seen in the ED found to have severe hyperkalemia, pancytopenia, lactic acidosis, FOBT positive admitted to ICU, with weakness and pancytopenia felt to be 2/2 Linezolid, changed to vancomycin.  She has been needing blood transfusion and platelet transfusion but no ongoing bleeding.  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 Patient stabilized transferred to [facility] 12/19. 12/19 Nursing unable to insert Foley catheter urology consulted but patient refused 12/21 Spontaneously voiding 12/22 Central line placed in preparation for home infusion.   New events last 24 hours / Subjective: No new issues overnight.   Assessment & Plan:   Principal Problem:   AKI (acute kidney injury) ([facility]) Active Problems:   Pancytopenia ([place])   Pressure injury of skin   Renal failure   Need for emotional support   Goals of care, counseling/discussion   Weakness   Chronic osteomyelitis ([place])   Palliative care encounter   Bacteremia due to Pseudomonas   Pain   Pancytopenia -Secondary to linezolid.  Antibiotics changed, see below -Status post 3 unit packed red blood cell transfusions, 3 unit platelet transfusion  Severe sepsis secondary to Pseudomonas bacteremia, pelvic osteomyelitis, open groin wound -ID consulted.  Ordered Merrem and daptomycin through 1/31.  OPAT order signed today -Central line placed for home infusion treatments  AKI on CKD stage V -Status post renal transplant -Hyperkalemia  resolved -Nephrology consulted.  Suspected ischemic ATN -Patient has been referred to [facility] for third renal transplant, although may not be eligible due to ongoing osteomyelitis -Immunosuppression currently on hold due to pancytopenia -Wean IV Solu-Cortef -IV fluid  Urinary retention -Patient declined urology consultation -Now voiding  Acute blood loss anemia, anemia of chronic renal disease -Status post 3 unit packed red blood cell transfusions -Eliquis is on hold now  History of DVT in 2021 -Eliquis is on hold   Hypertension -Coreg  DVT prophylaxis:  SCDs Start: 03/19/22 1636  Code Status: Full code Family Communication: At bedside Disposition Plan:  Status is: Inpatient Remains inpatient appropriate because: IV fluid   Antimicrobials:  Anti-infectives (From admission, onward)    Start     Dose/Rate Route Frequency Ordered Stop   03/25/22 0000  daptomycin (CUBICIN) IVPB  Status:  Discontinued        500 mg Intravenous Every 48 hours 03/24/22 0934 03/24/22    03/25/22 0000  daptomycin (CUBICIN) IVPB        500 mg Intravenous Every 48 hours 03/24/22 1322 05/03/22 2359   03/24/22 0000  meropenem (MERREM) IVPB  Status:  Discontinued        500 mg Intravenous Every 24 hours 03/24/22 0934 03/24/22    03/24/22 0000  meropenem (MERREM) IVPB        500 mg Intravenous Every 24 hours 03/24/22 1322 05/03/22 2359   03/23/22 2000  DAPTOmycin (CUBICIN) 500 mg in sodium chloride 0.9 % IVPB        8 mg/kg  61.3 kg 120 mL/hr over 30 Minutes Intravenous Every 48 hours 03/23/22 1622  03/23/22 1000  meropenem (MERREM) 500 mg in sodium chloride 0.9 % 100 mL IVPB        500 mg 200 mL/hr over 30 Minutes Intravenous Every 24 hours 03/22/22 1207     03/22/22 1300  meropenem (MERREM) 1 g in sodium chloride 0.9 % 100 mL IVPB        1 g 200 mL/hr over 30 Minutes Intravenous  Once 03/22/22 1207 03/22/22 1430   03/21/22 1000  doxycycline (VIBRA-TABS) tablet 100 mg  Status:  Discontinued         100 mg Oral Every 12 hours 03/21/22 0828 03/23/22 1626   03/21/22 0845  vancomycin (VANCOCIN) IVPB 1000 mg/200 mL premix  Status:  Discontinued        1,000 mg 200 mL/hr over 60 Minutes Intravenous  Once 03/21/22 0755 03/21/22 0828   03/20/22 1400  ceFEPIme (MAXIPIME) 1 g in sodium chloride 0.9 % 100 mL IVPB  Status:  Discontinued        1 g 200 mL/hr over 30 Minutes Intravenous Every 24 hours 03/19/22 1719 03/22/22 1207   03/19/22 1719  vancomycin variable dose per unstable renal function (pharmacist dosing)  Status:  Discontinued         Does not apply See admin instructions 03/19/22 1719 03/21/22 0828   03/19/22 1400  vancomycin (VANCOCIN) IVPB 1000 mg/200 mL premix        1,000 mg 200 mL/hr over 60 Minutes Intravenous  Once 03/19/22 1348 03/19/22 1628   03/19/22 1400  ceFEPIme (MAXIPIME) 2 g in sodium chloride 0.9 % 100 mL IVPB        2 g 200 mL/hr over 30 Minutes Intravenous  Once 03/19/22 1348 03/19/22 1511        Objective: Vitals:   03/24/22 0500 03/24/22 1110 03/24/22 1123 03/24/22 1240  BP:  (!) 154/86  (!) 158/99  Pulse:  88 81 78  Resp:  _0 Temp:    (!) 97.5 F (36.4 C)  TempSrc:    Oral  SpO2:  99% 99% 97%  Weight: 65.4 kg     Height:        Intake/Output Summary (Last 24 hours) at 03/24/2022 1503 Last data filed at 03/24/2022 0506 Gross per 24 hour  Intake 800 ml  Output 300 ml  Net 500 ml   Filed Weights   03/21/22 0500 03/23/22 0500 03/24/22 0500  Weight: 61.4 kg 61.3 kg 65.4 kg    Examination:  General exam: Appears calm and comfortable  Psychiatry: Judgement and insight appear normal. Mood & affect appropriate.   Data Reviewed: I have personally reviewed following labs and imaging studies  CBC: Recent Labs  Lab 03/19/22 1202 03/19/22 2157 03/20/22 1535 03/21/22 0253 03/21/22 1148 03/22/22 0247 03/22/22 1634 03/23/22 0250 03/24/22 0536  WBC 3.6*   < > 1.0* 1.2*  --  1.2*  --  1.0* 1.9*  NEUTROABS 2.3  --  0.6*  --   --   --    --   --   --   HGB 6.4*   < > 7.2* 6.9* 7.8* 7.7*  --  7.9* 7.2*  HCT 21.0*   < > 22.4* 21.3* 23.5* 23.6*  --  23.9* 22.3*  MCV 96.3   < > 93.3 92.2  --  92.5  --  92.6 94.9  PLT 31*   < > 30* 27*  --  18* 15* 21* 22*   < > = values in this interval not  displayed.   Basic Metabolic Panel: Recent Labs  Lab 03/20/22 0304 03/21/22 0253 03/22/22 0247 03/23/22 0250 03/24/22 0536  NA 138 140 139 136 134*  K 4.5 3.8 3.3* 3.2* 3.0*  CL 112* 112* 111 109 109  CO2 11* 12* 14* 14* 13*  GLUCOSE 150* 130* 124* 125* 106*  BUN 89* 92* 98* 107* 113*  CREATININE 8.35* 7.88* 7.83* 7.36* 6.97*  CALCIUM 6.9* 6.6* 6.9* 7.2* 7.4*  PHOS  --  7.1* 6.6* 5.5* 4.0   GFR: Estimated Creatinine Clearance: 8.2 mL/min (A) (by C-G formula based on SCr of 6.97 mg/dL (H)). Liver Function Tests: Recent Labs  Lab 03/19/22 1202 03/21/22 0253 03/22/22 0247 03/23/22 0250 03/24/22 0536  AST 22  --   --   --   --   ALT 9  --   --   --   --   ALKPHOS 59  --   --   --   --   BILITOT 0.7  --   --   --   --   PROT 7.7  --   --   --   --   ALBUMIN 3.4* 2.7* 2.2* 2.5* 2.6*   Recent Labs  Lab 03/19/22 1202  LIPASE 83*   No results for input(s): "AMMONIA" in the last 168 hours. Coagulation Profile: Recent Labs  Lab 03/19/22 1400 03/22/22 1634  INR 1.5* 1.2   Cardiac Enzymes: Recent Labs  Lab 03/23/22 1428  CKTOTAL 15*   BNP (last 3 results) No results for input(s): "PROBNP" in the last 8760 hours. HbA1C: No results for input(s): "HGBA1C" in the last 72 hours. CBG: Recent Labs  Lab 03/19/22 1455  GLUCAP 82   Lipid Profile: No results for input(s): "CHOL", "HDL", "LDLCALC", "TRIG", "CHOLHDL", "LDLDIRECT" in the last 72 hours. Thyroid Function Tests: No results for input(s): "TSH", "T4TOTAL", "FREET4", "T3FREE", "THYROIDAB" in the last 72 hours. Anemia Panel: Recent Labs    03/22/22 1634 03/23/22 0250  VITAMINB12  --  628  FERRITIN  --  4,472*  RETICCTPCT <0.4*  --    Sepsis  Labs: Recent Labs  Lab 03/19/22 1400 03/19/22 1524  LATICACIDVEN 3.9* 3.3*    Recent Results (from the past 240 hour(s))  Resp panel by RT-PCR (RSV, Flu A&B, Covid) Anterior Nasal Swab     Status: None   Collection Time: 03/19/22 12:02 PM   Specimen: Anterior Nasal Swab  Result Value Ref Range Status   SARS Coronavirus 2 by RT PCR NEGATIVE NEGATIVE Final    Comment: (NOTE) SARS-CoV-2 target nucleic acids are NOT DETECTED.  The SARS-CoV-2 RNA is generally detectable in upper respiratory specimens during the acute phase of infection. The lowest concentration of SARS-CoV-2 viral copies this assay can detect is 138 copies/mL. A negative result does not preclude SARS-Cov-2 infection and should not be used as the sole basis for treatment or other patient management decisions. A negative result may occur with  improper specimen collection/handling, submission of specimen other than nasopharyngeal swab, presence of viral mutation(s) within the areas targeted by this assay, and inadequate number of viral copies(<138 copies/mL). A negative result must be combined with clinical observations, patient history, and epidemiological information. The expected result is Negative.  Fact Sheet for Patients:  EntrepreneurPulse.com.au  Fact Sheet for Healthcare Providers:  IncredibleEmployment.be  This test is no t yet approved or cleared by the [place] FDA and  has been authorized for detection and/or diagnosis of SARS-CoV-2 by FDA under an Emergency Use Authorization (EUA).  This EUA will remain  in effect (meaning this test can be used) for the duration of the COVID-19 declaration under Section 564(b)(1) of the Act, 21 U.S.C.section 360bbb-3(b)(1), unless the authorization is terminated  or revoked sooner.       Influenza A by PCR NEGATIVE NEGATIVE Final   Influenza B by PCR NEGATIVE NEGATIVE Final    Comment: (NOTE) The Xpert Xpress  SARS-CoV-2/FLU/RSV plus assay is intended as an aid in the diagnosis of influenza from Nasopharyngeal swab specimens and should not be used as a sole basis for treatment. Nasal washings and aspirates are unacceptable for Xpert Xpress SARS-CoV-2/FLU/RSV testing.  Fact Sheet for Patients: EntrepreneurPulse.com.au  Fact Sheet for Healthcare Providers: IncredibleEmployment.be  This test is not yet approved or cleared by the [place] FDA and has been authorized for detection and/or diagnosis of SARS-CoV-2 by FDA under an Emergency Use Authorization (EUA). This EUA will remain in effect (meaning this test can be used) for the duration of the COVID-19 declaration under Section 564(b)(1) of the Act, 21 U.S.C. section 360bbb-3(b)(1), unless the authorization is terminated or revoked.     Resp Syncytial Virus by PCR NEGATIVE NEGATIVE Final    Comment: (NOTE) Fact Sheet for Patients: EntrepreneurPulse.com.au  Fact Sheet for Healthcare Providers: IncredibleEmployment.be  This test is not yet approved or cleared by the [place] FDA and has been authorized for detection and/or diagnosis of SARS-CoV-2 by FDA under an Emergency Use Authorization (EUA). This EUA will remain in effect (meaning this test can be used) for the duration of the COVID-19 declaration under Section 564(b)(1) of the Act, 21 U.S.C. section 360bbb-3(b)(1), unless the authorization is terminated or revoked.  Performed at [facility], [place] [address], [place], [place] [address]   Culture, blood (routine x 2)     Status: None   Collection Time: 03/19/22  1:29 PM   Specimen: BLOOD  Result Value Ref Range Status   Specimen Description   Final    BLOOD LEFT ANTECUBITAL Performed at [place] [address], [place], [place] [address]    Special Requests   Final    BOTTLES DRAWN AEROBIC AND ANAEROBIC  Blood Culture adequate volume Performed at [place] [address], [place], [place] [address]    Culture   Final    NO GROWTH 5 DAYS Performed at [facility], [place] [address] [place], [place] [address]    Report Status 03/24/2022 FINAL  Final  Culture, blood (routine x 2)     Status: Abnormal (Preliminary result)   Collection Time: 03/19/22  2:00 PM   Specimen: BLOOD  Result Value Ref Range Status   Specimen Description   Final    BLOOD RIGHT ANTECUBITAL Performed at [place] [address] [place], [place] [address]    Special Requests   Final    BOTTLES DRAWN AEROBIC AND ANAEROBIC Blood Culture results may not be optimal due to an excessive volume of blood received in culture bottles Performed at [place] [address], [place], [place] [address]    Culture  Setup Time   Final    GRAM NEGATIVE RODS ANAEROBIC BOTTLE ONLY CRITICAL RESULT CALLED TO, READ BACK BY AND VERIFIED WITH:  C/ PHARMD M. BELL 03/20/22 1342 A. LAFRANCE    Culture (A)  Final    PSEUDOMONAS AERUGINOSA Sent to [place] for further susceptibility testing. Performed at [facility], [place],  [place] [address]    Report Status PENDING  Incomplete   Organism ID, Bacteria PSEUDOMONAS AERUGINOSA  Final      Susceptibility   Pseudomonas aeruginosa - MIC*    CEFTAZIDIME 2 SENSITIVE Sensitive     CIPROFLOXACIN >=4 RESISTANT Resistant     GENTAMICIN <=1 SENSITIVE Sensitive     IMIPENEM 1 SENSITIVE Sensitive     PIP/TAZO 8 SENSITIVE Sensitive     * PSEUDOMONAS AERUGINOSA  Blood Culture ID Panel (Reflexed)     Status: Abnormal   Collection Time: 03/19/22  2:00 PM  Result Value Ref Range Status   Enterococcus faecalis NOT DETECTED NOT DETECTED Final   Enterococcus Faecium NOT DETECTED NOT DETECTED Final   Listeria monocytogenes NOT DETECTED NOT DETECTED Final   Staphylococcus species NOT DETECTED NOT DETECTED  Final   Staphylococcus aureus (BCID) NOT DETECTED NOT DETECTED Final   Staphylococcus epidermidis NOT DETECTED NOT DETECTED Final   Staphylococcus lugdunensis NOT DETECTED NOT DETECTED Final   Streptococcus species NOT DETECTED NOT DETECTED Final   Streptococcus agalactiae NOT DETECTED NOT DETECTED Final   Streptococcus pneumoniae NOT DETECTED NOT DETECTED Final   Streptococcus pyogenes NOT DETECTED NOT DETECTED Final   A.calcoaceticus-baumannii NOT DETECTED NOT DETECTED Final   Bacteroides fragilis NOT DETECTED NOT DETECTED Final   Enterobacterales NOT DETECTED NOT DETECTED Final   Enterobacter cloacae complex NOT DETECTED NOT DETECTED Final   Escherichia coli NOT DETECTED NOT DETECTED Final   Klebsiella aerogenes NOT DETECTED NOT DETECTED Final   Klebsiella oxytoca NOT DETECTED NOT DETECTED Final   Klebsiella pneumoniae NOT DETECTED NOT DETECTED Final   Proteus species NOT DETECTED NOT DETECTED Final   Salmonella species NOT DETECTED NOT DETECTED Final   Serratia marcescens NOT DETECTED NOT DETECTED Final   Haemophilus influenzae NOT DETECTED NOT DETECTED Final   Neisseria meningitidis NOT DETECTED NOT DETECTED Final   Pseudomonas aeruginosa DETECTED (A) NOT DETECTED Final    Comment: CRITICAL RESULT CALLED TO, READ BACK BY AND VERIFIED WITH:  C/ PHARMD M. BELL 03/20/22 1342 A. LAFRANCE    Stenotrophomonas maltophilia NOT DETECTED NOT DETECTED Final   Candida albicans NOT DETECTED NOT DETECTED Final   Candida auris NOT DETECTED NOT DETECTED Final   Candida glabrata NOT DETECTED NOT DETECTED Final   Candida krusei NOT DETECTED NOT DETECTED Final   Candida parapsilosis NOT DETECTED NOT DETECTED Final   Candida tropicalis NOT DETECTED NOT DETECTED Final   Cryptococcus neoformans/gattii NOT DETECTED NOT DETECTED Final   CTX-M ESBL NOT DETECTED NOT DETECTED Final   Carbapenem resistance IMP NOT DETECTED NOT DETECTED Final   Carbapenem resistance KPC NOT DETECTED NOT DETECTED Final    Carbapenem resistance NDM NOT DETECTED NOT DETECTED Final   Carbapenem resistance VIM NOT DETECTED NOT DETECTED Final    Comment: Performed at [facility], [address] [place], [place] [address]  MRSA Next Gen by PCR, Nasal     Status: None   Collection Time: 03/19/22  6:58 PM   Specimen: Nasal Mucosa; Nasal Swab  Result Value Ref Range Status   MRSA by PCR Next Gen NOT DETECTED NOT DETECTED Final    Comment: (NOTE) The GeneXpert MRSA Assay (FDA approved for NASAL specimens only), is one component of a comprehensive MRSA colonization surveillance program. It is not intended to diagnose MRSA infection nor to guide or monitor treatment for MRSA infections. Test performance is not FDA approved in patients less than 76 years old. Performed at [facility], [address] [place], [place] [address]   Urine Culture     Status: Abnormal   Collection Time: 03/20/22 12:32 AM   Specimen: Urine, Clean Catch  Result Value Ref Range Status   Specimen Description   Final    URINE, CLEAN CATCH Performed at [facility], [place] [address], [place], [place] [address]    Special Requests   Final    NONE Performed at [facility], [place] [address], [place], [place] [address]    Culture (A)  Final    <10,000 COLONIES/mL INSIGNIFICANT GROWTH Performed at [place] [address], [place], [place] [address]    Report Status 03/21/2022 FINAL  Final  C Difficile Quick Screen w PCR reflex     Status: None   Collection Time: 03/20/22  6:46 PM   Specimen: STOOL  Result Value Ref Range Status   C Diff antigen NEGATIVE NEGATIVE Final   C Diff toxin NEGATIVE NEGATIVE Final   C Diff interpretation No C. difficile detected.  Final    Comment: Performed at [facility], [place] [address] [place],  [address]      Radiology Studies: IR Fluoro Guide CV Line Right  Result Date: 03/24/2022 INDICATION: Poor  venous access. In need of durable intravenous access for antibiotic administration. 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 As such, request made for placement of a tunneled central venous catheter. EXAM: ULTRASOUND AND FLUOROSCOPIC GUIDED PLACEMENT OF TUNNELED CENTRAL VENOUS CATHETER MEDICATIONS: None. The patient is currently admitted to the hospital receiving intravenous antibiotics. The antibiotic was given in an appropriate time interval prior to skin puncture. ANESTHESIA/SEDATION: Moderate (conscious) sedation was employed during this procedure as administered by the Interventional Radiology RN. A total of Benadryl 25 mg and Fentanyl 100 mcg was administered intravenously. Moderate Sedation Time: 10 minutes. The patient's level of consciousness and vital signs were monitored continuously by radiology nursing throughout the procedure under my direct supervision. FLUOROSCOPY TIME:  30 seconds (1 mGy) COMPLICATIONS: None immediate. PROCEDURE: Informed written consent was obtained from the patient after a discussion of the risks, benefits, and alternatives to treatment. Questions regarding the procedure were encouraged and answered. The right neck and chest were prepped with chlorhexidine in a sterile fashion, and a sterile drape was applied covering the operative field. Maximum barrier sterile technique with sterile gowns and gloves were used for the procedure. A timeout was performed prior to the initiation of the procedure. After the overlying soft tissues were anesthetized, a small venotomy incision was created and a micropuncture kit was utilized to access the internal jugular vein. Real-time ultrasound guidance was utilized for vascular access including the acquisition of a permanent ultrasound image documenting patency of the accessed vessel. The microwire was utilized to measure appropriate catheter length. The micropuncture sheath was exchanged for a peel-away sheath over  a guidewire. A 5 [place] dual lumen tunneled central venous catheter measuring 22 cm was tunneled in a retrograde fashion from the anterior chest wall to the venotomy incision. The catheter was then placed through the peel-away sheath with tip ultimately positioned at the superior caval-atrial junction. Final catheter positioning was confirmed and documented with a spot radiographic image. The catheter aspirates and flushes normally. The catheter was flushed with appropriate volume heparin dwells. The catheter exit site was secured with a 0-Prolene retention suture. The venotomy incision was closed with Dermabond and Steri-strips. Dressings were applied. The patient tolerated the procedure well without  immediate post procedural complication. FINDINGS: After catheter placement, the tip lies within the superior cavoatrial junction. The catheter aspirates and flushes normally and is ready for immediate use. IMPRESSION: Successful placement of 22 cm dual lumen tunneled central venous catheter via the right internal jugular vein with tip terminating at the superior caval atrial junction. The catheter is ready for immediate use. Electronically Signed   By: Sandi Mariscal M.D.   On: 03/24/2022 12:25   IR US Guide Vasc Access Right  Result Date: 03/24/2022 INDICATION: Poor venous access. In need of durable intravenous access for antibiotic administration. 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 As such, request made for placement of a tunneled central venous catheter. EXAM: ULTRASOUND AND FLUOROSCOPIC GUIDED PLACEMENT OF TUNNELED CENTRAL VENOUS CATHETER MEDICATIONS: None. The patient is currently admitted to the hospital receiving intravenous antibiotics. The antibiotic was given in an appropriate time interval prior to skin puncture. ANESTHESIA/SEDATION: Moderate (conscious) sedation was employed during this procedure as administered by the Interventional Radiology RN. A total of Benadryl  25 mg and Fentanyl 100 mcg was administered intravenously. Moderate Sedation Time: 10 minutes. The patient's level of consciousness and vital signs were monitored continuously by radiology nursing throughout the procedure under my direct supervision. FLUOROSCOPY TIME:  30 seconds (1 mGy) COMPLICATIONS: None immediate. PROCEDURE: Informed written consent was obtained from the patient after a discussion of the risks, benefits, and alternatives to treatment. Questions regarding the procedure were encouraged and answered. The right neck and chest were prepped with chlorhexidine in a sterile fashion, and a sterile drape was applied covering the operative field. Maximum barrier sterile technique with sterile gowns and gloves were used for the procedure. A timeout was performed prior to the initiation of the procedure. After the overlying soft tissues were anesthetized, a small venotomy incision was created and a micropuncture kit was utilized to access the internal jugular vein. Real-time ultrasound guidance was utilized for vascular access including the acquisition of a permanent ultrasound image documenting patency of the accessed vessel. The microwire was utilized to measure appropriate catheter length. The micropuncture sheath was exchanged for a peel-away sheath over a guidewire. A 5 [place] dual lumen tunneled central venous catheter measuring 22 cm was tunneled in a retrograde fashion from the anterior chest wall to the venotomy incision. The catheter was then placed through the peel-away sheath with tip ultimately positioned at the superior caval-atrial junction. Final catheter positioning was confirmed and documented with a spot radiographic image. The catheter aspirates and flushes normally. The catheter was flushed with appropriate volume heparin dwells. The catheter exit site was secured with a 0-Prolene retention suture. The venotomy incision was closed with Dermabond and Steri-strips. Dressings were applied.  The patient tolerated the procedure well without immediate post procedural complication. FINDINGS: After catheter placement, the tip lies within the superior cavoatrial junction. The catheter aspirates and flushes normally and is ready for immediate use. IMPRESSION: Successful placement of 22 cm dual lumen tunneled central venous catheter via the right internal jugular vein with tip terminating at the superior caval atrial junction. The catheter is ready for immediate use. Electronically Signed   By: Sandi Mariscal M.D.   On: 03/24/2022 12:25   IR TUNNELED CENTRAL VENOUS CATHETER PLACEMENT  Result Date: 03/24/2022 INDICATION: Poor venous access. In need of durable intravenous access for antibiotic administration. 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 As such, request made for placement of a tunneled central venous catheter. EXAM: ULTRASOUND AND FLUOROSCOPIC  GUIDED PLACEMENT OF TUNNELED CENTRAL VENOUS CATHETER MEDICATIONS: None. The patient is currently admitted to the hospital receiving intravenous antibiotics. The antibiotic was given in an appropriate time interval prior to skin puncture. ANESTHESIA/SEDATION: Moderate (conscious) sedation was employed during this procedure as administered by the Interventional Radiology RN. A total of Benadryl 25 mg and Fentanyl 100 mcg was administered intravenously. Moderate Sedation Time: 10 minutes. The patient's level of consciousness and vital signs were monitored continuously by radiology nursing throughout the procedure under my direct supervision. FLUOROSCOPY TIME:  30 seconds (1 mGy) COMPLICATIONS: None immediate. PROCEDURE: Informed written consent was obtained from the patient after a discussion of the risks, benefits, and alternatives to treatment. Questions regarding the procedure were encouraged and answered. The right neck and chest were prepped with chlorhexidine in a sterile fashion, and a sterile drape was applied covering  the operative field. Maximum barrier sterile technique with sterile gowns and gloves were used for the procedure. A timeout was performed prior to the initiation of the procedure. After the overlying soft tissues were anesthetized, a small venotomy incision was created and a micropuncture kit was utilized to access the internal jugular vein. Real-time ultrasound guidance was utilized for vascular access including the acquisition of a permanent ultrasound image documenting patency of the accessed vessel. The microwire was utilized to measure appropriate catheter length. The micropuncture sheath was exchanged for a peel-away sheath over a guidewire. A 5 [place] dual lumen tunneled central venous catheter measuring 22 cm was tunneled in a retrograde fashion from the anterior chest wall to the venotomy incision. The catheter was then placed through the peel-away sheath with tip ultimately positioned at the superior caval-atrial junction. Final catheter positioning was confirmed and documented with a spot radiographic image. The catheter aspirates and flushes normally. The catheter was flushed with appropriate volume heparin dwells. The catheter exit site was secured with a 0-Prolene retention suture. The venotomy incision was closed with Dermabond and Steri-strips. Dressings were applied. The patient tolerated the procedure well without immediate post procedural complication. FINDINGS: After catheter placement, the tip lies within the superior cavoatrial junction. The catheter aspirates and flushes normally and is ready for immediate use. IMPRESSION: Successful placement of 22 cm dual lumen tunneled central venous catheter via the right internal jugular vein with tip terminating at the superior caval atrial junction. The catheter is ready for immediate use. Electronically Signed   By: Sandi Mariscal M.D.   On: 03/24/2022 12:25   [place] EKG SITE RITE  Result Date: 03/23/2022 If Site Rite image not attached, placement could  not be confirmed due to current cardiac rhythm.  NM Bone Scan 3 Phase  Result Date: 03/22/2022 CLINICAL DATA:  RIGHT hip prosthesis.  Concern for joint infection. EXAM: NUCLEAR MEDICINE 3-PHASE BONE SCAN TECHNIQUE: Radionuclide angiographic images, immediate static blood pool images, and 3-hour delayed static images were obtained of the hips after intravenous injection of radiopharmaceutical. RADIOPHARMACEUTICALS:  21.1 mCi Tc-23mMDP IV COMPARISON:  CT 03/20/2022, PET-CT 03/09/2022 FINDINGS: Vascular phase: No asymmetric or increased blood flow to the LEFT or RIGHT hip. Blood pool phase: No abnormal blood pool activity within LEFT RIGHT hip. Photopenia noted associated with the RIGHT hip prosthetic. Delayed phase: Delayed phase imaging demonstrates no significant abnormal increased activity associated the RIGHT hip prosthetic. IMPRESSION: No evidence infection the RIGHT hip prosthetic Electronically Signed   By: SSuzy BouchardM.D.   On: 03/22/2022 17:09      Scheduled Meds:  carvedilol  12.5 mg Oral BID WC  Chlorhexidine Gluconate Cloth  6 each Topical Daily   diphenhydrAMINE       diphenhydrAMINE  50 mg Intravenous On Call   fentaNYL       [START ON 03/25/2022] hydrocortisone sodium succinate  50 mg Intravenous Daily   lidocaine-EPINEPHrine       mouth rinse  15 mL Mouth Rinse 4 times per day   pantoprazole (PROTONIX) IV  40 mg Intravenous Q12H   potassium chloride  20 mEq Oral BID   Continuous Infusions:  DAPTOmycin (CUBICIN) 500 mg in sodium chloride 0.9 % IVPB 500 mg (03/23/22 2139)   meropenem (MERREM) IV 500 mg (03/24/22 0936)     LOS: 5 days   Time spent: 25 minutes   Dessa Phi, DO [facility] 03/24/2022, 3:03 PM   Available via Epic secure chat 7am-7pm After these hours, please refer to coverage provider listed on [email or web address]

## 2022-03-24 NOTE — Progress Notes (Signed)
Patient Status: [facility] - In-pt  Assessment and Plan: Patient in need of venous access for home antibiotics.  Patient with end stage renal disease, osteomyelitis.  In need of durable venous access for home use.  Tunneled central line requested.   Discussed with patient who is not NPO today.  Plan to proceed with local numbing and consider IV benadryl.  She is anxious, but agreeable.   Risks and benefits discussed with the patient including, but not limited to bleeding, infection, vascular injury, pneumothorax which may require chest tube placement, air embolism or even death  All of the patient's questions were answered, patient is agreeable to proceed. Consent signed and in chart.  ______________________________________________________________________   History of Present Illness: Christina Mack is a 56 y.o. female with history of vulvar cancer s/p treatment complicated by right groin/inguinal open wound with chronic osteomyelitits.  She is s/p THA and kidney transplant x2 however her kidney function continues to decline.  She is in need of durable venous access for home antibiotics.  Allergies and medications reviewed.   Review of Systems: A 12 point ROS discussed and pertinent positives are indicated in the HPI above.  All other systems are negative.  Review of Systems  Constitutional:  Negative for fatigue and fever.  Respiratory:  Negative for cough and shortness of breath.   Gastrointestinal:  Negative for abdominal pain, nausea and vomiting.  Musculoskeletal:  Negative for back pain.  Psychiatric/Behavioral:  Negative for behavioral problems and confusion.     Vital Signs: BP (!) 168/99 (BP Location: Right Arm)   Pulse 86   Temp 99.1 F (37.3 C) (Oral)   Resp 18   Ht '5\' 5"'$  (1.651 m)   Wt 144 lb 2.9 oz (65.4 kg)   SpO2 99%   BMI 23.99 kg/m   Physical Exam Vitals and nursing note reviewed.  Constitutional:      General: She is not in acute distress.     Appearance: Normal appearance. She is not ill-appearing.  HENT:     Mouth/Throat:     Mouth: Mucous membranes are moist.     Pharynx: Oropharynx is clear.  Cardiovascular:     Rate and Rhythm: Normal rate and regular rhythm.  Pulmonary:     Effort: Pulmonary effort is normal.     Breath sounds: Normal breath sounds.  Skin:    Findings: Bruising (extensive) present.  Neurological:     General: No focal deficit present.     Mental Status: She is alert and oriented to person, place, and time. Mental status is at baseline.  Psychiatric:        Mood and Affect: Mood normal.        Behavior: Behavior normal.        Thought Content: Thought content normal.        Judgment: Judgment normal.      Imaging reviewed.   Labs:  COAGS: Recent Labs    01/19/22 1251 03/19/22 1400 03/22/22 1634  INR 1.1 1.5* 1.2  APTT  --   --  35    BMP: Recent Labs    03/21/22 0253 03/22/22 0247 03/23/22 0250 03/24/22 0536  NA 140 139 136 134*  K 3.8 3.3* 3.2* 3.0*  CL 112* 111 109 109  CO2 12* 14* 14* 13*  GLUCOSE 130* 124* 125* 106*  BUN 92* 98* 107* 113*  CALCIUM 6.6* 6.9* 7.2* 7.4*  CREATININE 7.88* 7.83* 7.36* 6.97*  GFRNONAA 6* 6* 6* 6*  Electronically Signed: Docia Barrier, PA 03/24/2022, 10:06 AM   I spent a total of 15 minutes in face to face in clinical consultation, greater than 50% of which was counseling/coordinating care for venous access.

## 2022-03-24 NOTE — TOC Progression Note (Addendum)
Transition of Care [facility]) - Progression Note    Patient Details  Name: Christina Mack MRN: [id number] Date of Birth: Aug 08, 1965  Transition of Care [facility] CM/SW [place], LCSW Phone Number: 03/24/2022, 1:30 PM  Clinical Narrative:    Pt to return home w/ 6 week IV abx course. Met with pt and confirmed need for HHPT/RN. [place] services have been arranged with [facility]. Carolynn Sayers with [employer] is following for IV abx/equipment needs. Pt's insurance requiring pre-authorization. Prior authorization has been requested and ID: [id number]. Supplies will be ordered for pt once insurance has been approved.   Update 3:35pm- Called [employer] at [phone] to check on status of authorization. Pt's authorization for IV abx is still under review.   Expected Discharge Plan: [place] Barriers to Discharge: Equipment Delay  Expected Discharge Plan and Services In-house Referral: NA Discharge Planning Services: CM Consult Post Acute Care Choice: Durable Medical Equipment, Home Health Living arrangements for the past 2 months: Single Family Home                 DME Arranged: IV pump/equipment DME Agency: Other - Comment Ysidro Evert) Date DME Agency Contacted: 03/24/22 Time DME Agency Contacted: 1227 Representative spoke with at DME Agency: Carolynn Sayers HH Arranged: PT, RN [facility] Agency: [place] Date [place]: 03/24/22 Time [place]: 1228 Representative spoke with at [place]: [place] Determinants of Health ([place]) Interventions [place]: No Food Insecurity (03/20/2022)  Housing: Low Risk  (03/20/2022)  Transportation Needs: No Transportation Needs (03/20/2022)  Utilities: Not At Risk (03/20/2022)  Depression (PHQ2-9): Low Risk  (11/10/2021)  Tobacco Use: Low Risk  (03/24/2022)    Readmission Risk Interventions    03/24/2022   11:54 AM  Readmission Risk Prevention Plan  Transportation  Screening Complete  Medication Review ([place]) Complete  PCP or Specialist appointment within 3-5 days of discharge Complete  HRI or [place] Complete  SW Recovery Care/Counseling Consult Complete  [place] Not Applicable

## 2022-03-25 DIAGNOSIS — N179 Acute kidney failure, unspecified: Secondary | ICD-10-CM | POA: Diagnosis not present

## 2022-03-25 LAB — CBC
HCT: 20.2 % — ABNORMAL LOW (ref 36.0–46.0)
Hemoglobin: 6.5 g/dL — CL (ref 12.0–15.0)
MCH: 30.2 pg (ref 26.0–34.0)
MCHC: 32.2 g/dL (ref 30.0–36.0)
MCV: 94 fL (ref 80.0–100.0)
Platelets: 26 10*3/uL — CL (ref 150–400)
RBC: 2.15 MIL/uL — ABNORMAL LOW (ref 3.87–5.11)
RDW: 16.6 % — ABNORMAL HIGH (ref 11.5–15.5)
WBC: 2.1 10*3/uL — ABNORMAL LOW (ref 4.0–10.5)
nRBC: 0 % (ref 0.0–0.2)

## 2022-03-25 LAB — RENAL FUNCTION PANEL
Albumin: 2.4 g/dL — ABNORMAL LOW (ref 3.5–5.0)
Anion gap: 9 (ref 5–15)
BUN: 115 mg/dL — ABNORMAL HIGH (ref 6–20)
CO2: 13 mmol/L — ABNORMAL LOW (ref 22–32)
Calcium: 7.4 mg/dL — ABNORMAL LOW (ref 8.9–10.3)
Chloride: 115 mmol/L — ABNORMAL HIGH (ref 98–111)
Creatinine, Ser: 6.88 mg/dL — ABNORMAL HIGH (ref 0.44–1.00)
GFR, Estimated: 7 mL/min — ABNORMAL LOW (ref >60)
Glucose, Bld: 110 mg/dL — ABNORMAL HIGH (ref 70–99)
Phosphorus: 4.2 mg/dL (ref 2.5–4.6)
Potassium: 3.1 mmol/L — ABNORMAL LOW (ref 3.5–5.1)
Sodium: 137 mmol/L (ref 135–145)

## 2022-03-25 LAB — PREPARE RBC (CROSSMATCH)

## 2022-03-25 LAB — HEMOGLOBIN AND HEMATOCRIT, BLOOD
HCT: 23.2 % — ABNORMAL LOW (ref 36.0–46.0)
Hemoglobin: 7.6 g/dL — ABNORMAL LOW (ref 12.0–15.0)

## 2022-03-25 MED ORDER — POTASSIUM CHLORIDE CRYS ER 20 MEQ PO TBCR
40.0000 meq | EXTENDED_RELEASE_TABLET | Freq: Two times a day (BID) | ORAL | Status: DC
  Administered 2022-03-25: 40 meq via ORAL
  Filled 2022-03-25: qty 2

## 2022-03-25 MED ORDER — POTASSIUM CHLORIDE 20 MEQ PO PACK: 40.0000 meq | PACK | Freq: Two times a day (BID) | ORAL | Status: DC

## 2022-03-25 MED ORDER — SODIUM BICARBONATE 650 MG PO TABS
650.0000 mg | ORAL_TABLET | Freq: Three times a day (TID) | ORAL | Status: DC
  Administered 2022-03-25: 650 mg via ORAL
  Filled 2022-03-25: qty 1

## 2022-03-25 MED ORDER — POTASSIUM CHLORIDE CRYS ER 20 MEQ PO TBCR
40.0000 meq | EXTENDED_RELEASE_TABLET | Freq: Once | ORAL | Status: AC
  Administered 2022-03-25: 40 meq via ORAL
  Filled 2022-03-25: qty 2

## 2022-03-25 MED ORDER — SODIUM BICARBONATE 650 MG PO TABS: 650.0000 mg | ORAL_TABLET | Freq: Three times a day (TID) | ORAL | 0 refills | Status: DC

## 2022-03-25 MED ORDER — SODIUM CHLORIDE 0.9% IV SOLUTION: Freq: Once | INTRAVENOUS | Status: AC

## 2022-03-25 MED ORDER — CARVEDILOL 12.5 MG PO TABS: 12.5000 mg | ORAL_TABLET | Freq: Two times a day (BID) | ORAL | 0 refills | Status: DC

## 2022-03-25 MED ORDER — HEPARIN SOD (PORK) LOCK FLUSH 100 UNIT/ML IV SOLN
250.0000 [IU] | INTRAVENOUS | Status: DC | PRN
  Filled 2022-03-25: qty 2.5

## 2022-03-25 MED ORDER — SODIUM CHLORIDE 0.9% FLUSH: 10.0000 mL | INTRAVENOUS | Status: DC | PRN

## 2022-03-25 NOTE — Discharge Summary (Addendum)
Physician Discharge Summary  Christina Mack JGO:[id number] DOB: Oct 17, 1965 DOA: 03/19/2022  PCP: Marjory Lies, NP   Admit date: 03/19/2022 Discharge date: 03/25/2022  Admitted From: Home Disposition:  Home with home health   Recommendations for Outpatient Follow-up:  Follow up with PCP in 1 week Follow up with ID 04/11/22  Follow up with Nephrology, [facility]   Discharge Condition: Stable CODE STATUS: Full  Diet recommendation: Renal diet   Brief/Interim Summary: Christina Mack is a 56yof w/hx vulvar CA s/p chemo/XRT complicated by chronic R groin/inguinal open wound with known osteomyelitis, supposed to be seeing ortho onc at [facility] at some point, followed by ID here for chronic wound, has been on Linezolid for a few months presented with generalized weakness and decreased oral intake x 3 days.  Patient seen in the ED found to have severe hyperkalemia, pancytopenia, lactic acidosis, FOBT positive admitted to ICU, with weakness and pancytopenia felt to be 2/2 Linezolid, changed to vancomycin.  She has been needing blood transfusion and platelet transfusion but no ongoing bleeding.  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 Patient stabilized transferred to [facility] 12/19. 12/19 Nursing unable to insert Foley catheter urology consulted but patient refused 12/21 Spontaneously voiding 12/22 Central line placed in preparation for home infusion.  12/23 Blood transfused prior to discharge home. Final antibiotic regimen Daptomycin/Merrem through 05/03/22 per ID  Discharge Diagnoses:   Principal Problem:   AKI (acute kidney injury) ([place]) Active Problems:   Pancytopenia ([place])   Pressure injury of skin   Renal failure   Need for emotional support   Goals of care, counseling/discussion   Weakness   Chronic osteomyelitis ([place])   Palliative care encounter   Bacteremia due to Pseudomonas   Pain  Pancytopenia -Secondary to linezolid.  Antibiotics changed, see  below -Status post 4 unit packed red blood cell transfusions, 3 unit platelet transfusion   Severe sepsis secondary to Pseudomonas bacteremia, pelvic osteomyelitis, open groin wound -ID consulted.  Ordered Merrem and daptomycin through 1/31.  OPAT order signed and TOC has arranged for home health  -Central line placed for home infusion treatments 12/22    AKI on CKD stage V -Status post renal transplant -Hyperkalemia resolved -Nephrology consulted.  Suspected ischemic ATN -Patient has been referred to [facility] for third renal transplant, although may not be eligible due to ongoing osteomyelitis -Immunosuppression currently on hold due to pancytopenia -Follow up outpatient    Urinary retention -Patient declined urology consultation -Now voiding   Acute blood loss anemia, anemia of chronic renal disease -Status post 4 unit packed red blood cell transfusions -Eliquis is on hold now   History of DVT in 2021 -Eliquis is on hold    Hypertension -Coreg    Discharge Instructions  Discharge Instructions     [facility] pharmacist to adjust dose for Vancomycin, Aminoglycosides and other anti-infective therapies as requested by physician.   Complete by: As directed    [facility] to provide Cath Flo 28m   Complete by: As directed    Administer for PICC line occlusion and as ordered by physician for other access device issues.   Anaphylaxis Kit: Provided to treat any anaphylactic reaction to the medication being provided to the patient if First Dose or when requested by physician   Complete by: As directed    Epinephrine 119mml vial / amp: Administer 0.19m319m0.19ml56mubcutaneously once for moderate to severe anaphylaxis, nurse to call physician and pharmacy when reaction occurs and  call 911 if needed for immediate care   Diphenhydramine 89m/ml IV vial: Administer 25-562mIV/IM PRN for first dose reaction, rash, itching, mild reaction, nurse to call physician and  pharmacy when reaction occurs   Sodium Chloride 0.9% NS 50062mV: Administer if needed for hypovolemic blood pressure drop or as ordered by physician after call to physician with anaphylactic reaction   Call MD for:  difficulty breathing, headache or visual disturbances   Complete by: As directed    Call MD for:  extreme fatigue   Complete by: As directed    Call MD for:  hives   Complete by: As directed    Call MD for:  persistant dizziness or light-headedness   Complete by: As directed    Call MD for:  persistant nausea and vomiting   Complete by: As directed    Call MD for:  severe uncontrolled pain   Complete by: As directed    Call MD for:  temperature >100.4   Complete by: As directed    Change dressing on IV access line weekly and PRN   Complete by: As directed    Discharge instructions   Complete by: As directed    .[email or web address]   Flush IV access with Sodium Chloride 0.9% and Heparin 10 units/ml or 100 units/ml   Complete by: As directed    Home infusion instructions - [facility]   Complete by: As directed    Instructions: Flush IV access with Sodium Chloride 0.9% and Heparin 10units/ml or 100units/ml   Change dressing on IV access line: Weekly and PRN   Instructions Cath Flo 2mg42mdminister for PICC Line occlusion and as ordered by physician for other access device   [facility] pharmacist to adjust dose for: Vancomycin, Aminoglycosides and other anti-infective therapies as requested by physician   Increase activity slowly   Complete by: As directed    Method of administration may be changed at the discretion of home infusion pharmacist based upon assessment of the patient and/or caregiver's ability to self-administer the medication ordered   Complete by: As directed    No wound care   Complete by: As directed       Allergies as of 03/25/2022       Reactions   Oxycodone-acetaminophen Itching, Nausea And Vomiting, Nausea Only, Rash, Other (See  Comments)   Headaches, also   Piperacillin-tazobactam In Dex    Other Reaction(s): Other (See Comments) Thrombocytopenia  Thrombocytopenia   Zosyn [piperacillin Sod-tazobactam So] Other (See Comments)   Thrombocytopenia   Hydromorphone Nausea And Vomiting, Rash, Other (See Comments)   Amlodipine Other (See Comments), Swelling   Ankle swelling   Codeine    vomiting        Medication List     STOP taking these medications    apixaban 2.5 MG Tabs tablet Commonly known as: ELIQUIS   linezolid 600 MG tablet Commonly known as: ZYVOX   methocarbamol 750 MG tablet Commonly known as: Robaxin-750   moxifloxacin 400 MG tablet Commonly known as: AVELOX   tacrolimus 1 MG capsule Commonly known as: PROGRAF       TAKE these medications    acetaminophen 500 MG tablet Commonly known as: TYLENOL Take 2 tablets (1,000 mg total) by mouth 3 (three) times daily. What changed:  when to take this reasons to take this   carvedilol 12.5 MG tablet Commonly known as: COREG Take 1 tablet (12.5 mg total) by mouth 2 (two) times daily with a meal.  What changed:  medication strength how much to take when to take this   daptomycin  IVPB Commonly known as: CUBICIN Inject 500 mg into the vein every other day. Indication:  pseudomonas bacteremia and pelvic osteomyelitis First Dose: Yes Last Day of Therapy:  05/03/22 Labs - Once weekly:  CBC/D, CMP, CPK, ESR and CRP Please leave PIC in place until doctor has seen patient or been notified Fax weekly labs to [phone] Method of administration: IV Push Method of administration may be changed at the discretion of home infusion pharmacist based upon assessment of the patient and/or caregiver's ability to self-administer the medication ordered.   HYDROcodone-acetaminophen 7.5-325 MG tablet Commonly known as: Norco Take 1-2 tablets by mouth 3 (three) times daily as needed for moderate pain.   meropenem  IVPB Commonly known as:  MERREM Inject 500 mg into the vein daily. Indication:  pseudomonas bacteremia and pelvic osteomyelitis First Dose: Yes Last Day of Therapy:  05/03/22 Labs - Once weekly:  CBC/D, CMP, CPK, ESR and CRP Please leave PIC in place until doctor has seen patient or been notified Fax weekly labs to [phone] Method of administration: Mini-Bag Plus / Gravity Method of administration may be changed at the discretion of home infusion pharmacist based upon assessment of the patient and/or caregiver's ability to self-administer the medication ordered.   pantoprazole 40 MG tablet Commonly known as: PROTONIX TAKE 1 TABLET (40 MG TOTAL) BY MOUTH 2 (TWO) TIMES DAILY BEFORE A MEAL. What changed:  how much to take when to take this   predniSONE 5 MG tablet Commonly known as: DELTASONE Take 5 mg by mouth daily with breakfast.   sodium bicarbonate 650 MG tablet Take 1 tablet (650 mg total) by mouth 3 (three) times daily.               Discharge Care Instructions  (From admission, onward)           Start     Ordered   03/24/22 0000  Change dressing on IV access line weekly and PRN  (Home infusion instructions - [facility] )        03/24/22 [place]. Follow up.   Why: Latricia Heft will follow up with you at discharg to provide home health Contact information: [place] [address] [phone]         Willaim Rayas, NP Follow up.   Specialty: Emergency Medicine Contact information: [place] [address] [place], Ryan B, MD Follow up.   Specialty: Nephrology Contact information: [place] [address] [phone]         [facility] [place] Follow up.   Specialty: General Practice Contact information: [place] [address] [phone]         [place] Callas, NP. Go on 04/11/2022.   Specialty: Infectious Diseases Contact information: [place] [address] [phone]                Allergies  Allergen Reactions   Oxycodone-Acetaminophen Itching, Nausea And Vomiting, Nausea Only, Rash and Other (See Comments)    Headaches, also    Piperacillin-Tazobactam In Dex     Other Reaction(s): Other (See Comments)  Thrombocytopenia  Thrombocytopenia  Zosyn [Piperacillin Sod-Tazobactam So] Other (See Comments)    Thrombocytopenia   Hydromorphone Nausea And Vomiting, Rash and Other (See Comments)   Amlodipine Other (See Comments) and Swelling    Ankle swelling   Codeine     vomiting     Procedures/Studies: IR Fluoro Guide CV Line Right  Result Date: 03/24/2022 INDICATION: Poor venous access. In need of durable intravenous access for antibiotic administration. 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 As such, request made for placement of a tunneled central venous catheter. EXAM: ULTRASOUND AND FLUOROSCOPIC GUIDED PLACEMENT OF TUNNELED CENTRAL VENOUS CATHETER MEDICATIONS: None. The patient is currently admitted to the hospital receiving intravenous antibiotics. The antibiotic was given in an appropriate time interval prior to skin puncture. ANESTHESIA/SEDATION: Moderate (conscious) sedation was employed during this procedure as administered by the Interventional Radiology RN. A total of Benadryl 25 mg and Fentanyl 100 mcg was administered intravenously. Moderate Sedation Time: 10 minutes. The patient's level of consciousness and vital signs were monitored continuously by radiology nursing throughout the procedure under my direct supervision. FLUOROSCOPY TIME:  30 seconds (1 mGy) COMPLICATIONS: None immediate. PROCEDURE: Informed written consent was obtained from the patient after a discussion of the risks, benefits, and alternatives to treatment. Questions regarding the procedure were encouraged and  answered. The right neck and chest were prepped with chlorhexidine in a sterile fashion, and a sterile drape was applied covering the operative field. Maximum barrier sterile technique with sterile gowns and gloves were used for the procedure. A timeout was performed prior to the initiation of the procedure. After the overlying soft tissues were anesthetized, a small venotomy incision was created and a micropuncture kit was utilized to access the internal jugular vein. Real-time ultrasound guidance was utilized for vascular access including the acquisition of a permanent ultrasound image documenting patency of the accessed vessel. The microwire was utilized to measure appropriate catheter length. The micropuncture sheath was exchanged for a peel-away sheath over a guidewire. A 5 [place] dual lumen tunneled central venous catheter measuring 22 cm was tunneled in a retrograde fashion from the anterior chest wall to the venotomy incision. The catheter was then placed through the peel-away sheath with tip ultimately positioned at the superior caval-atrial junction. Final catheter positioning was confirmed and documented with a spot radiographic image. The catheter aspirates and flushes normally. The catheter was flushed with appropriate volume heparin dwells. The catheter exit site was secured with a 0-Prolene retention suture. The venotomy incision was closed with Dermabond and Steri-strips. Dressings were applied. The patient tolerated the procedure well without immediate post procedural complication. FINDINGS: After catheter placement, the tip lies within the superior cavoatrial junction. The catheter aspirates and flushes normally and is ready for immediate use. IMPRESSION: Successful placement of 22 cm dual lumen tunneled central venous catheter via the right internal jugular vein with tip terminating at the superior caval atrial junction. The catheter is ready for immediate use. Electronically Signed   By: Sandi Mariscal M.D.   On: 03/24/2022 12:25   IR US Guide Vasc Access Right  Result Date: 03/24/2022 INDICATION: Poor venous access. In need of durable intravenous access for antibiotic administration. 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 As such, request made for placement of a tunneled central venous catheter. EXAM: ULTRASOUND AND FLUOROSCOPIC GUIDED PLACEMENT OF TUNNELED CENTRAL VENOUS CATHETER MEDICATIONS: None. The patient is currently admitted to the hospital receiving intravenous antibiotics. The antibiotic was given in an appropriate time interval prior  to skin puncture. ANESTHESIA/SEDATION: Moderate (conscious) sedation was employed during this procedure as administered by the Interventional Radiology RN. A total of Benadryl 25 mg and Fentanyl 100 mcg was administered intravenously. Moderate Sedation Time: 10 minutes. The patient's level of consciousness and vital signs were monitored continuously by radiology nursing throughout the procedure under my direct supervision. FLUOROSCOPY TIME:  30 seconds (1 mGy) COMPLICATIONS: None immediate. PROCEDURE: Informed written consent was obtained from the patient after a discussion of the risks, benefits, and alternatives to treatment. Questions regarding the procedure were encouraged and answered. The right neck and chest were prepped with chlorhexidine in a sterile fashion, and a sterile drape was applied covering the operative field. Maximum barrier sterile technique with sterile gowns and gloves were used for the procedure. A timeout was performed prior to the initiation of the procedure. After the overlying soft tissues were anesthetized, a small venotomy incision was created and a micropuncture kit was utilized to access the internal jugular vein. Real-time ultrasound guidance was utilized for vascular access including the acquisition of a permanent ultrasound image documenting patency of the accessed vessel. The microwire was  utilized to measure appropriate catheter length. The micropuncture sheath was exchanged for a peel-away sheath over a guidewire. A 5 [place] dual lumen tunneled central venous catheter measuring 22 cm was tunneled in a retrograde fashion from the anterior chest wall to the venotomy incision. The catheter was then placed through the peel-away sheath with tip ultimately positioned at the superior caval-atrial junction. Final catheter positioning was confirmed and documented with a spot radiographic image. The catheter aspirates and flushes normally. The catheter was flushed with appropriate volume heparin dwells. The catheter exit site was secured with a 0-Prolene retention suture. The venotomy incision was closed with Dermabond and Steri-strips. Dressings were applied. The patient tolerated the procedure well without immediate post procedural complication. FINDINGS: After catheter placement, the tip lies within the superior cavoatrial junction. The catheter aspirates and flushes normally and is ready for immediate use. IMPRESSION: Successful placement of 22 cm dual lumen tunneled central venous catheter via the right internal jugular vein with tip terminating at the superior caval atrial junction. The catheter is ready for immediate use. Electronically Signed   By: Sandi Mariscal M.D.   On: 03/24/2022 12:25   IR TUNNELED CENTRAL VENOUS CATHETER PLACEMENT  Result Date: 03/24/2022 INDICATION: Poor venous access. In need of durable intravenous access for antibiotic administration. 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 As such, request made for placement of a tunneled central venous catheter. EXAM: ULTRASOUND AND FLUOROSCOPIC GUIDED PLACEMENT OF TUNNELED CENTRAL VENOUS CATHETER MEDICATIONS: None. The patient is currently admitted to the hospital receiving intravenous antibiotics. The antibiotic was given in an appropriate time interval prior to skin puncture. ANESTHESIA/SEDATION:  Moderate (conscious) sedation was employed during this procedure as administered by the Interventional Radiology RN. A total of Benadryl 25 mg and Fentanyl 100 mcg was administered intravenously. Moderate Sedation Time: 10 minutes. The patient's level of consciousness and vital signs were monitored continuously by radiology nursing throughout the procedure under my direct supervision. FLUOROSCOPY TIME:  30 seconds (1 mGy) COMPLICATIONS: None immediate. PROCEDURE: Informed written consent was obtained from the patient after a discussion of the risks, benefits, and alternatives to treatment. Questions regarding the procedure were encouraged and answered. The right neck and chest were prepped with chlorhexidine in a sterile fashion, and a sterile drape was applied covering the operative field. Maximum barrier sterile technique with sterile gowns and  gloves were used for the procedure. A timeout was performed prior to the initiation of the procedure. After the overlying soft tissues were anesthetized, a small venotomy incision was created and a micropuncture kit was utilized to access the internal jugular vein. Real-time ultrasound guidance was utilized for vascular access including the acquisition of a permanent ultrasound image documenting patency of the accessed vessel. The microwire was utilized to measure appropriate catheter length. The micropuncture sheath was exchanged for a peel-away sheath over a guidewire. A 5 [place] dual lumen tunneled central venous catheter measuring 22 cm was tunneled in a retrograde fashion from the anterior chest wall to the venotomy incision. The catheter was then placed through the peel-away sheath with tip ultimately positioned at the superior caval-atrial junction. Final catheter positioning was confirmed and documented with a spot radiographic image. The catheter aspirates and flushes normally. The catheter was flushed with appropriate volume heparin dwells. The catheter exit site  was secured with a 0-Prolene retention suture. The venotomy incision was closed with Dermabond and Steri-strips. Dressings were applied. The patient tolerated the procedure well without immediate post procedural complication. FINDINGS: After catheter placement, the tip lies within the superior cavoatrial junction. The catheter aspirates and flushes normally and is ready for immediate use. IMPRESSION: Successful placement of 22 cm dual lumen tunneled central venous catheter via the right internal jugular vein with tip terminating at the superior caval atrial junction. The catheter is ready for immediate use. Electronically Signed   By: Sandi Mariscal M.D.   On: 03/24/2022 12:25   [place] EKG SITE RITE  Result Date: 03/23/2022 If Site Rite image not attached, placement could not be confirmed due to current cardiac rhythm.  NM Bone Scan 3 Phase  Result Date: 03/22/2022 CLINICAL DATA:  RIGHT hip prosthesis.  Concern for joint infection. EXAM: NUCLEAR MEDICINE 3-PHASE BONE SCAN TECHNIQUE: Radionuclide angiographic images, immediate static blood pool images, and 3-hour delayed static images were obtained of the hips after intravenous injection of radiopharmaceutical. RADIOPHARMACEUTICALS:  21.1 mCi Tc-20mMDP IV COMPARISON:  CT 03/20/2022, PET-CT 03/09/2022 FINDINGS: Vascular phase: No asymmetric or increased blood flow to the LEFT or RIGHT hip. Blood pool phase: No abnormal blood pool activity within LEFT RIGHT hip. Photopenia noted associated with the RIGHT hip prosthetic. Delayed phase: Delayed phase imaging demonstrates no significant abnormal increased activity associated the RIGHT hip prosthetic. IMPRESSION: No evidence infection the RIGHT hip prosthetic Electronically Signed   By: SSuzy BouchardM.D.   On: 03/22/2022 17:09   [place] (TRANSABDOMINAL ONLY)  Result Date: 03/21/2022 CLINICAL DATA:  56year old woman with history of vulvar carcinoma and chronic pelvic osteomyelitis. Chronic thickening of  right labial soft tissues. EXAM: LIMITED ULTRASOUND OF PELVIS TECHNIQUE: Limited transabdominal ultrasound examination of the pelvis was performed. COMPARISON:  CT abdomen pelvis 03/20/2022 FINDINGS: Targeted sonographic evaluation of the area of concern in the right labial region demonstrates soft tissue thickening without discrete fluid collection. IMPRESSION: Sonographic evaluation of the area of concern in the right labia demonstrates soft tissue thickening similar to prior CT scans dating back to 04/03/2020. This may be due to chronic inflammation or postsurgical change. No discrete fluid collection identified to indicate abscess. Electronically Signed   By: FMiachel RouxM.D.   On: 03/21/2022 14:08   CT ABDOMEN PELVIS WO CONTRAST  Result Date: 03/20/2022 CLINICAL DATA:  Pelvic osteomyelitis. EXAM: CT ABDOMEN AND PELVIS WITHOUT CONTRAST TECHNIQUE: Multidetector CT imaging of the abdomen and pelvis was performed following the standard protocol without IV contrast.  RADIATION DOSE REDUCTION: This exam was performed according to the departmental dose-optimization program which includes automated exposure control, adjustment of the mA and/or kV according to patient size and/or use of iterative reconstruction technique. COMPARISON:  PET-CT dated 03/09/2022. CT abdomen and pelvis dated 04/03/2020. FINDINGS: Lower chest: No acute abnormality. Hepatobiliary: No focal liver abnormality is seen. Gallbladder is distended but otherwise unremarkable. No bile duct dilatation is seen. Pancreas: Unremarkable. No pancreatic ductal dilatation or surrounding inflammatory changes. Spleen: Normal in size without focal abnormality. Adrenals/Urinary Tract: Native kidneys are atrophic. 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 Additional hydronephrosis of the somewhat atrophic-appearing transplant kidney in the LEFT pelvis. Bladder is moderately distended but otherwise  unremarkable. Stomach/Bowel: No dilated large or small bowel loops are seen within the abdomen or pelvis. Stomach is unremarkable. Vascular/Lymphatic: No abdominal aortic aneurysm. Asymmetrically prominent LEFT common iliac vein, of uncertain significance. No enlarged lymph nodes are identified within the abdomen or pelvis. Reproductive: Presumed hysterectomy.  No adnexal mass or free fluid. Other: Asymmetric soft tissue thickening involving the upper RIGHT perineum, incompletely imaged, with soft tissue wound underlying the RIGHT inferior pubic ramus. Destructive/lytic changes of the bilateral symphysis pubis and RIGHT inferior pubic ramus (discontinuity of the mid/posterior portion of the RIGHT inferior pubic ramus), corresponding to patient's known pathologic process in this area and the previously imaging reports describing a chronic open wound of the RIGHT perineum. However, there is increased destruction/discontinuity of the RIGHT inferior pubic ramus compared to the earlier PET-CT of 07/20/2021 indicating some progression. Symphysis pubis is stable. Musculoskeletal: Also, chronic pathologic-appearing fracture deformity at the lateral aspect of the RIGHT superior pubic ramus/anterior acetabulum. The adjacent RIGHT hip prosthesis appears intact and stable in position. Osseous structures of the abdomen are unremarkable. IMPRESSION: 1. Asymmetric soft tissue thickening involving the upper RIGHT perineum, incompletely imaged, with chronic open soft tissue wound underlying the RIGHT inferior pubic ramus. Associated destructive/lytic changes of the RIGHT inferior pubic ramus (discontinuity of the mid/posterior portion of the RIGHT inferior pubic ramus), corresponding to patient's known pathologic process in this area. However, there is increased destruction/discontinuity of the RIGHT inferior pubic ramus compared to the earlier PET-CT of 07/20/2021 indicating some degree of progression. 2. Chronic  pathologic-appearing fracture deformity at the lateral aspect of the RIGHT superior pubic ramus/anterior acetabulum. The adjacent RIGHT hip prosthesis appears intact and stable in position. 3. 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 Additional hydronephrosis of the somewhat atrophic-appearing transplant kidney in the LEFT pelvis. Bladder is distended, suggesting associated bladder outlet obstruction. Electronically Signed   By: Franki Cabot M.D.   On: 03/20/2022 14:50   US Renal Transplant w/Doppler  Result Date: 03/19/2022 CLINICAL DATA:  Acute on chronic renal failure EXAM: ULTRASOUND OF RENAL TRANSPLANT WITH RENAL DOPPLER ULTRASOUND TECHNIQUE: Ultrasound examination of the renal transplant was performed with gray-scale, color and duplex doppler evaluation. COMPARISON:  None Available. FINDINGS: Transplant kidney location: RLQ Transplant Kidney: Renal measurements: 11.2 x 5.3 x 6.4 cm = volume: 251m. Normal in size and parenchymal echogenicity. Moderate hydronephrosis. No peri-transplant fluid collection seen. Color flow in the main renal artery:  Yes Color flow in the main renal vein:  Yes Duplex Doppler Evaluation: Main Renal Artery Velocity: 27.7 cm/sec Main Renal Artery Resistive Index: 0.7 Venous waveform in main renal vein:  Present Intrarenal resistive index in upper pole:  0.5 (normal 0.6-0.8; equivocal 0.8-0.9; abnormal >= 0.9) Intrarenal resistive  index in lower pole: 0.5 (normal 0.6-0.8; equivocal 0.8-0.9; abnormal >= 0.9) Bladder: Normal for degree of bladder distention. Other findings:  None. IMPRESSION: Right lower quadrant renal transplant with moderate hydronephrosis. Patent transplant vasculature with normal resistive indices. Electronically Signed   By: Julian Hy M.D.   On: 03/19/2022 23:07   DG Chest 2 View  Result Date: 03/19/2022 CLINICAL DATA:  Dyspnea EXAM: CHEST - 2 VIEW COMPARISON:  Chest x-ray January 19, 2022  FINDINGS: The cardiomediastinal silhouette is unchanged in contour. No focal pulmonary opacity. No pleural effusion or pneumothorax. The visualized upper abdomen is unremarkable. No acute osseous abnormality. IMPRESSION: No acute cardiopulmonary abnormality. Electronically Signed   By: Beryle Flock M.D.   On: 03/19/2022 12:30   VAS [place] LOWER EXTREMITY VENOUS (DVT)  Result Date: 03/13/2022  Lower Venous DVT Study Patient Name:  CAROLEEN STOERMER  Date of Exam:   03/13/2022 Medical Rec #: [id number]     Accession #:    [id number] Date of Birth: 1965-04-28    Patient Gender: F Patient Age:   69 years Exam Location:  [facility] Procedure:      VAS [place] LOWER EXTREMITY VENOUS (DVT) Referring Phys: Jeral Pinch --------------------------------------------------------------------------------  Indications: Swelling.  Risk Factors: Cancer History of vulvar cancer DVT right femoral vein 2021 at [facility] History of kidney transplant. Anticoagulation: Eliquis. Limitations: Surgical scarring after lymph node removal. Comparison Study: Prior study done at [facility] in 2021 indicating DVT in the right                   proximal to mid femoral veins Performing Technologist: Sharion Dove RVS  Examination Guidelines: A complete evaluation includes B-mode imaging, spectral Doppler, color Doppler, and power Doppler as needed of all accessible portions of each vessel. Bilateral testing is considered an integral part of a complete examination. Limited examinations for reoccurring indications may be performed as noted. The reflux portion of the exam is performed with the patient in reverse Trendelenburg.  +---------+---------------+---------+-----------+----------+--------------+ RIGHT    CompressibilityPhasicitySpontaneityPropertiesThrombus Aging +---------+---------------+---------+-----------+----------+--------------+ CFV      Full           Yes      Yes                                  +---------+---------------+---------+-----------+----------+--------------+ SFJ      Full                                                        +---------+---------------+---------+-----------+----------+--------------+ FV Prox  Full                                                        +---------+---------------+---------+-----------+----------+--------------+ FV Mid   Full                                                        +---------+---------------+---------+-----------+----------+--------------+ FV DistalFull                                                        +---------+---------------+---------+-----------+----------+--------------+  PFV      Full                                                        +---------+---------------+---------+-----------+----------+--------------+ POP      Full           Yes      Yes                                 +---------+---------------+---------+-----------+----------+--------------+ PTV      Full                                                        +---------+---------------+---------+-----------+----------+--------------+ PERO     Full                                                        +---------+---------------+---------+-----------+----------+--------------+   +----+---------------+---------+-----------+----------+--------------+ LEFTCompressibilityPhasicitySpontaneityPropertiesThrombus Aging +----+---------------+---------+-----------+----------+--------------+ CFV Full           Yes      Yes                                 +----+---------------+---------+-----------+----------+--------------+     Summary: RIGHT: - No evidence of deep vein thrombosis in the lower extremity. No indirect evidence of obstruction proximal to the inguinal ligament. - No cystic structure found in the popliteal fossa.  LEFT: - No evidence of common femoral vein obstruction.  *See table(s) above for measurements  and observations. Electronically signed by Monica Martinez MD on 03/13/2022 at 1:51:31 PM.    Final    NM PET Image Restage (PS) Skull Base to Thigh (F-18 FDG)  Result Date: 03/10/2022 CLINICAL DATA:  Subsequent treatment strategy for vulvar carcinoma. EXAM: NUCLEAR MEDICINE PET SKULL BASE TO THIGH TECHNIQUE: 6.89 mCi F-18 FDG was injected intravenously. Full-ring PET imaging was performed from the skull base to thigh after the radiotracer. CT data was obtained and used for attenuation correction and anatomic localization. Fasting blood glucose: 93 mg/dl COMPARISON:  Multiple prior studies most recent comparison with PET 07/20/2021 FINDINGS: Mediastinal blood pool activity: SUV max 2.53 Liver activity: SUV max NA NECK: No hypermetabolic lymph nodes in the neck. Incidental CT findings: None CHEST: No hypermetabolic mediastinal or hilar nodes. No suspicious pulmonary nodules on the CT scan. Incidental CT findings: Coronary artery calcifications of LEFT anterior descending coronary artery and RIGHT coronary artery. Normal heart size. No pericardial effusion. Otherwise unremarkable appearance of cardiac structures. Limited assessment of cardiovascular structures given lack of intravenous contrast. Lungs are clear and airways are patent. No adenopathy by size criteria in the chest. ABDOMEN/PELVIS: RIGHT groin uptake of FDG increasing over time (image 185/4) fullness of soft tissues in this area with a maximum SUV of 6.67 as compared to 4.94 areas approximately double the size as it was previously measuring up to approximately 3.6 cm based on FDG uptake. This finding  is confounded by the presence of known chronic osteomyelitis and large defect, previously associated with infection in this area. Better visualization of adjacent pubic root fracture on the current study with frank bony destruction now of the inferior pubic ramus. No solid organ disease. No gross focal uptake to suggest recurrence in the site of  resection though there is peripheral uptake that is indeterminate. Uptake about the anal canal potentially physiologic but in close proximity to previously resected vulvar primary. Incidental CT findings: Marked atrophy of the native kidneys. Post RIGHT lower quadrant renal transplant. Small amount of excreted urine is in the urinary bladder. Amount of uptake and or excretion of FDG by the RIGHT kidney is perhaps less than expected though is not well assessed. No adenopathy by size criteria in the retroperitoneum. No acute gastrointestinal findings. Aortic atherosclerosis. Failed LEFT lower quadrant renal transplant. SKELETON: Continued destruction of the RIGHT pubic bone. Areas of sclerotic mottled changes about the symphysis pubis. No visible focal fluid collection in this area. Post RIGHT hip arthroplasty. Edema and obvious expansion of the RIGHT upper thigh. Increased metabolic activity about RIGHT upper thigh presumably related to the tract of hip arthroplasty placement. Incidental CT findings: None. IMPRESSION: 1. No signs of distant metastatic disease. 2. Progressive changes of osteomyelitis and signs of pubic bone fracture on the RIGHT. 3. Increasing metabolic activity of the RIGHT groin could relate to worsening inflammation in this area, focal nature does not allow for exclusion of metastatic process. Would suggest focused ultrasound with sampling as warranted. 4. No gross focal uptake to suggest recurrence in the site of resection though there is peripheral uptake that is indeterminate. 5. Gross edema throughout the lower extremity on the RIGHT with expansion of the upper RIGHT thigh. Correlate with any signs of impaired venous drainage. Given the marked asymmetry now nearly 6 weeks following arthroplasty would suggest RIGHT lower extremity venous Doppler to exclude DVT. 6. Anal canal uptake potentially physiologic. Given patient history and proximity to tumor considered correlation with physical exam. 7.  Post RIGHT lower quadrant renal transplant. Amount of uptake and or excretion of FDG by the RIGHT kidney is perhaps less than expected though is not well assessed. Correlate with renal function. 8. Aortic atherosclerosis and coronary artery disease. 9. Post RIGHT hip arthroplasty with increased metabolic activity potentially related to recent surgery. It would be difficult to exclude the possibility of secondary infection of this area in the setting of chronic open wound and worsening sequela of osteomyelitis. Aortic Atherosclerosis (ICD10-I70.0). These results will be called to the ordering clinician or representative by the Radiologist Assistant, and communication documented in the PACS or [employer]. Electronically Signed   By: Zetta Bills M.D.   On: 03/10/2022 12:57   XR Knee 1-2 Views Right  Result Date: 03/05/2022 2 view x-rays of the right knee show stable plate and screw fixation of a nondisplaced tibial plateau fracture.  XR HIP UNILAT W OR W/O PELVIS 2-3 VIEWS RIGHT  Result Date: 03/03/2022 X-rays of the right hip show stable total hip replacement in good position without any complications.     Discharge Exam: Vitals:   03/25/22 1140 03/25/22 1339  BP: (!) 165/92 (!) 156/94  Pulse: 78 73  Resp: 16 16  Temp: 98.1 F (36.7 C) 97.7 F (36.5 C)  SpO2: 99% 99%    General: Pt is alert, awake, not in acute distress Cardiovascular: RRR, S1/S2 +, no edema Respiratory: CTA bilaterally, no wheezing, no rhonchi, no respiratory distress, no conversational  dyspnea  Abdominal: Soft, NT, ND, bowel sounds + Psych: Normal mood and affect, stable judgement and insight     The results of significant diagnostics from this hospitalization (including imaging, microbiology, ancillary and laboratory) are listed below for reference.     Microbiology: Recent Results (from the past 240 hour(s))  Resp panel by RT-PCR (RSV, Flu A&B, Covid) Anterior Nasal Swab     Status: None   Collection  Time: 03/19/22 12:02 PM   Specimen: Anterior Nasal Swab  Result Value Ref Range Status   SARS Coronavirus 2 by RT PCR NEGATIVE NEGATIVE Final    Comment: (NOTE) SARS-CoV-2 target nucleic acids are NOT DETECTED.  The SARS-CoV-2 RNA is generally detectable in upper respiratory specimens during the acute phase of infection. The lowest concentration of SARS-CoV-2 viral copies this assay can detect is 138 copies/mL. A negative result does not preclude SARS-Cov-2 infection and should not be used as the sole basis for treatment or other patient management decisions. A negative result may occur with  improper specimen collection/handling, submission of specimen other than nasopharyngeal swab, presence of viral mutation(s) within the areas targeted by this assay, and inadequate number of viral copies(<138 copies/mL). A negative result must be combined with clinical observations, patient history, and epidemiological information. The expected result is Negative.  Fact Sheet for Patients:  EntrepreneurPulse.com.au  Fact Sheet for Healthcare Providers:  IncredibleEmployment.be  This test is no t yet approved or cleared by the [place] FDA and  has been authorized for detection and/or diagnosis of SARS-CoV-2 by FDA under an Emergency Use Authorization (EUA). This EUA will remain  in effect (meaning this test can be used) for the duration of the COVID-19 declaration under Section 564(b)(1) of the Act, 21 U.S.C.section 360bbb-3(b)(1), unless the authorization is terminated  or revoked sooner.       Influenza A by PCR NEGATIVE NEGATIVE Final   Influenza B by PCR NEGATIVE NEGATIVE Final    Comment: (NOTE) The Xpert Xpress SARS-CoV-2/FLU/RSV plus assay is intended as an aid in the diagnosis of influenza from Nasopharyngeal swab specimens and should not be used as a sole basis for treatment. Nasal washings and aspirates are unacceptable for Xpert Xpress  SARS-CoV-2/FLU/RSV testing.  Fact Sheet for Patients: EntrepreneurPulse.com.au  Fact Sheet for Healthcare Providers: IncredibleEmployment.be  This test is not yet approved or cleared by the [place] FDA and has been authorized for detection and/or diagnosis of SARS-CoV-2 by FDA under an Emergency Use Authorization (EUA). This EUA will remain in effect (meaning this test can be used) for the duration of the COVID-19 declaration under Section 564(b)(1) of the Act, 21 U.S.C. section 360bbb-3(b)(1), unless the authorization is terminated or revoked.     Resp Syncytial Virus by PCR NEGATIVE NEGATIVE Final    Comment: (NOTE) Fact Sheet for Patients: EntrepreneurPulse.com.au  Fact Sheet for Healthcare Providers: IncredibleEmployment.be  This test is not yet approved or cleared by the [place] FDA and has been authorized for detection and/or diagnosis of SARS-CoV-2 by FDA under an Emergency Use Authorization (EUA). This EUA will remain in effect (meaning this test can be used) for the duration of the COVID-19 declaration under Section 564(b)(1) of the Act, 21 U.S.C. section 360bbb-3(b)(1), unless the authorization is terminated or revoked.  Performed at [facility], [place] [address], [place], [place] [address]   Culture, blood (routine x 2)     Status: None   Collection Time: 03/19/22  1:29 PM   Specimen: BLOOD  Result Value  Ref Range Status   Specimen Description   Final    BLOOD LEFT ANTECUBITAL Performed at [facility] [address] [place], [place] [address]    Special Requests   Final    BOTTLES DRAWN AEROBIC AND ANAEROBIC Blood Culture adequate volume Performed at [facility] [address], [place], [place] [address]    Culture   Final    NO GROWTH 5 DAYS Performed at [facility], [place] [address], [place], [place]  [address]    Report Status 03/24/2022 FINAL  Final  Culture, blood (routine x 2)     Status: Abnormal (Preliminary result)   Collection Time: 03/19/22  2:00 PM   Specimen: BLOOD  Result Value Ref Range Status   Specimen Description   Final    BLOOD RIGHT ANTECUBITAL Performed at [facility] [address] [place], [place] [address]    Special Requests   Final    BOTTLES DRAWN AEROBIC AND ANAEROBIC Blood Culture results may not be optimal due to an excessive volume of blood received in culture bottles Performed at [facility] [address], [place], [place] [address]    Culture  Setup Time   Final    GRAM NEGATIVE RODS ANAEROBIC BOTTLE ONLY CRITICAL RESULT CALLED TO, READ BACK BY AND VERIFIED WITH:  C/ PHARMD M. BELL 03/20/22 1342 A. LAFRANCE    Culture (A)  Final    PSEUDOMONAS AERUGINOSA Sent to [facility] for further susceptibility testing. Performed at [facility], [place] [address], [place], [place] [address]    Report Status PENDING  Incomplete   Organism ID, Bacteria PSEUDOMONAS AERUGINOSA  Final      Susceptibility   Pseudomonas aeruginosa - MIC*    CEFTAZIDIME 2 SENSITIVE Sensitive     CIPROFLOXACIN >=4 RESISTANT Resistant     GENTAMICIN <=1 SENSITIVE Sensitive     IMIPENEM 1 SENSITIVE Sensitive     PIP/TAZO 8 SENSITIVE Sensitive     * PSEUDOMONAS AERUGINOSA  Blood Culture ID Panel (Reflexed)     Status: Abnormal   Collection Time: 03/19/22  2:00 PM  Result Value Ref Range Status   Enterococcus faecalis NOT DETECTED NOT DETECTED Final   Enterococcus Faecium NOT DETECTED NOT DETECTED Final   Listeria monocytogenes NOT DETECTED NOT DETECTED Final   Staphylococcus species NOT DETECTED NOT DETECTED Final   Staphylococcus aureus (BCID) NOT DETECTED NOT DETECTED Final   Staphylococcus epidermidis NOT DETECTED NOT DETECTED Final   Staphylococcus lugdunensis NOT DETECTED NOT DETECTED Final   Streptococcus species NOT DETECTED NOT  DETECTED Final   Streptococcus agalactiae NOT DETECTED NOT DETECTED Final   Streptococcus pneumoniae NOT DETECTED NOT DETECTED Final   Streptococcus pyogenes NOT DETECTED NOT DETECTED Final   A.calcoaceticus-baumannii NOT DETECTED NOT DETECTED Final   Bacteroides fragilis NOT DETECTED NOT DETECTED Final   Enterobacterales NOT DETECTED NOT DETECTED Final   Enterobacter cloacae complex NOT DETECTED NOT DETECTED Final   Escherichia coli NOT DETECTED NOT DETECTED Final   Klebsiella aerogenes NOT DETECTED NOT DETECTED Final   Klebsiella oxytoca NOT DETECTED NOT DETECTED Final   Klebsiella pneumoniae NOT DETECTED NOT DETECTED Final   Proteus species NOT DETECTED NOT DETECTED Final   Salmonella species NOT DETECTED NOT DETECTED Final   Serratia marcescens NOT DETECTED NOT DETECTED Final   Haemophilus influenzae NOT DETECTED NOT DETECTED Final   Neisseria meningitidis NOT DETECTED NOT DETECTED Final   Pseudomonas aeruginosa DETECTED (A) NOT DETECTED  Final    Comment: CRITICAL RESULT CALLED TO, READ BACK BY AND VERIFIED WITH:  C/ PHARMD M. BELL 03/20/22 1342 A. LAFRANCE    Stenotrophomonas maltophilia NOT DETECTED NOT DETECTED Final   Candida albicans NOT DETECTED NOT DETECTED Final   Candida auris NOT DETECTED NOT DETECTED Final   Candida glabrata NOT DETECTED NOT DETECTED Final   Candida krusei NOT DETECTED NOT DETECTED Final   Candida parapsilosis NOT DETECTED NOT DETECTED Final   Candida tropicalis NOT DETECTED NOT DETECTED Final   Cryptococcus neoformans/gattii NOT DETECTED NOT DETECTED Final   CTX-M ESBL NOT DETECTED NOT DETECTED Final   Carbapenem resistance IMP NOT DETECTED NOT DETECTED Final   Carbapenem resistance KPC NOT DETECTED NOT DETECTED Final   Carbapenem resistance NDM NOT DETECTED NOT DETECTED Final   Carbapenem resistance VIM NOT DETECTED NOT DETECTED Final    Comment: Performed at [facility], [address] [place], [place] [address]  MRSA Next Gen by PCR,  Nasal     Status: None   Collection Time: 03/19/22  6:58 PM   Specimen: Nasal Mucosa; Nasal Swab  Result Value Ref Range Status   MRSA by PCR Next Gen NOT DETECTED NOT DETECTED Final    Comment: (NOTE) The GeneXpert MRSA Assay (FDA approved for NASAL specimens only), is one component of a comprehensive MRSA colonization surveillance program. It is not intended to diagnose MRSA infection nor to guide or monitor treatment for MRSA infections. Test performance is not FDA approved in patients less than 15 years old. Performed at [facility], [place] [address] [place], [name] [address]   Urine Culture     Status: Abnormal   Collection Time: 03/20/22 12:32 AM   Specimen: Urine, Clean Catch  Result Value Ref Range Status   Specimen Description   Final    URINE, CLEAN CATCH Performed at [facility], [place] [address] [place], [place] [address]    Special Requests   Final    NONE Performed at [facility], [place] [address] [place], [place] [address]    Culture (A)  Final    <10,000 COLONIES/mL INSIGNIFICANT GROWTH Performed at [facility] [address], [place], [place] [address]    Report Status 03/21/2022 FINAL  Final  C Difficile Quick Screen w PCR reflex     Status: None   Collection Time: 03/20/22  6:46 PM   Specimen: STOOL  Result Value Ref Range Status   C Diff antigen NEGATIVE NEGATIVE Final   C Diff toxin NEGATIVE NEGATIVE Final   C Diff interpretation No C. difficile detected.  Final    Comment: Performed at [facility], [place] [address] [place], [place] [address]     Labs: BNP (last 3 results) No results for input(s): "BNP" in the last 8760 hours. Basic Metabolic Panel: Recent Labs  Lab 03/21/22 0253 03/22/22 0247 03/23/22 0250 03/24/22 0536 03/25/22 0402  NA 140 139 136 134* 137  K 3.8 3.3* 3.2* 3.0* 3.1*  CL 112* 111 109 109 115*  CO2 12* 14* 14* 13* 13*  GLUCOSE 130* 124*  125* 106* 110*  BUN 92* 98* 107* 113* 115*  CREATININE 7.88* 7.83* 7.36* 6.97* 6.88*  CALCIUM 6.6* 6.9* 7.2* 7.4* 7.4*  PHOS 7.1* 6.6* 5.5* 4.0 4.2   Liver Function Tests: Recent Labs  Lab 03/19/22 1202 03/21/22 0253 03/22/22 0247 03/23/22 0250 03/24/22 0536 03/25/22 0402  AST 22  --   --   --   --   --  ALT 9  --   --   --   --   --   ALKPHOS 59  --   --   --   --   --   BILITOT 0.7  --   --   --   --   --   PROT 7.7  --   --   --   --   --   ALBUMIN 3.4* 2.7* 2.2* 2.5* 2.6* 2.4*   Recent Labs  Lab 03/19/22 1202  LIPASE 83*   No results for input(s): "AMMONIA" in the last 168 hours. CBC: Recent Labs  Lab 03/19/22 1202 03/19/22 2157 03/20/22 1535 03/21/22 0253 03/21/22 1148 03/22/22 0247 03/22/22 1634 03/23/22 0250 03/24/22 0536 03/25/22 0402  WBC 3.6*   < > 1.0* 1.2*  --  1.2*  --  1.0* 1.9* 2.1*  NEUTROABS 2.3  --  0.6*  --   --   --   --   --   --   --   HGB 6.4*   < > 7.2* 6.9* 7.8* 7.7*  --  7.9* 7.2* 6.5*  HCT 21.0*   < > 22.4* 21.3* 23.5* 23.6*  --  23.9*  22.9* 22.3* 20.2*  MCV 96.3   < > 93.3 92.2  --  92.5  --  92.6 94.9 94.0  PLT 31*   < > 30* 27*  --  18* 15* 21* 22* 26*   < > = values in this interval not displayed.   Cardiac Enzymes: Recent Labs  Lab 03/23/22 1428  CKTOTAL 15*   BNP: Invalid input(s): "POCBNP" CBG: Recent Labs  Lab 03/19/22 1455  GLUCAP 82   D-Dimer Recent Labs    03/22/22 1634  DDIMER 3.55*   Hgb A1c No results for input(s): "HGBA1C" in the last 72 hours. Lipid Profile No results for input(s): "CHOL", "HDL", "LDLCALC", "TRIG", "CHOLHDL", "LDLDIRECT" in the last 72 hours. Thyroid function studies No results for input(s): "TSH", "T4TOTAL", "T3FREE", "THYROIDAB" in the last 72 hours.  Invalid input(s): "FREET3" Anemia work up Recent Labs    03/22/22 1634 03/23/22 0250  VITAMINB12  --  628  FERRITIN  --  4,472*  RETICCTPCT <0.4*  --    Urinalysis    Component Value Date/Time   COLORURINE YELLOW  03/20/2022 0132   APPEARANCEUR CLEAR 03/20/2022 0132   LABSPEC 1.012 03/20/2022 0132   PHURINE 5.0 03/20/2022 0132   GLUCOSEU 50 (A) 03/20/2022 0132   HGBUR LARGE (A) 03/20/2022 0132   BILIRUBINUR NEGATIVE 03/20/2022 0132   BILIRUBINUR negative 11/02/2020 1037   KETONESUR 5 (A) 03/20/2022 0132   PROTEINUR >=300 (A) 03/20/2022 0132   UROBILINOGEN 0.2 11/02/2020 1037   NITRITE NEGATIVE 03/20/2022 0132   LEUKOCYTESUR NEGATIVE 03/20/2022 0132   Sepsis Labs Recent Labs  Lab 03/22/22 0247 03/23/22 0250 03/24/22 0536 03/25/22 0402  WBC 1.2* 1.0* 1.9* 2.1*   Microbiology Recent Results (from the past 240 hour(s))  Resp panel by RT-PCR (RSV, Flu A&B, Covid) Anterior Nasal Swab     Status: None   Collection Time: 03/19/22 12:02 PM   Specimen: Anterior Nasal Swab  Result Value Ref Range Status   SARS Coronavirus 2 by RT PCR NEGATIVE NEGATIVE Final    Comment: (NOTE) SARS-CoV-2 target nucleic acids are NOT DETECTED.  The SARS-CoV-2 RNA is generally detectable in upper respiratory specimens during the acute phase of infection. The lowest concentration of SARS-CoV-2 viral copies this assay can detect is 138 copies/mL. A negative result does not  preclude SARS-Cov-2 infection and should not be used as the sole basis for treatment or other patient management decisions. A negative result may occur with  improper specimen collection/handling, submission of specimen other than nasopharyngeal swab, presence of viral mutation(s) within the areas targeted by this assay, and inadequate number of viral copies(<138 copies/mL). A negative result must be combined with clinical observations, patient history, and epidemiological information. The expected result is Negative.  Fact Sheet for Patients:  EntrepreneurPulse.com.au  Fact Sheet for Healthcare Providers:  IncredibleEmployment.be  This test is no t yet approved or cleared by the [place] FDA and   has been authorized for detection and/or diagnosis of SARS-CoV-2 by FDA under an Emergency Use Authorization (EUA). This EUA will remain  in effect (meaning this test can be used) for the duration of the COVID-19 declaration under Section 564(b)(1) of the Act, 21 U.S.C.section 360bbb-3(b)(1), unless the authorization is terminated  or revoked sooner.       Influenza A by PCR NEGATIVE NEGATIVE Final   Influenza B by PCR NEGATIVE NEGATIVE Final    Comment: (NOTE) The Xpert Xpress SARS-CoV-2/FLU/RSV plus assay is intended as an aid in the diagnosis of influenza from Nasopharyngeal swab specimens and should not be used as a sole basis for treatment. Nasal washings and aspirates are unacceptable for Xpert Xpress SARS-CoV-2/FLU/RSV testing.  Fact Sheet for Patients: EntrepreneurPulse.com.au  Fact Sheet for Healthcare Providers: IncredibleEmployment.be  This test is not yet approved or cleared by the [place] FDA and has been authorized for detection and/or diagnosis of SARS-CoV-2 by FDA under an Emergency Use Authorization (EUA). This EUA will remain in effect (meaning this test can be used) for the duration of the COVID-19 declaration under Section 564(b)(1) of the Act, 21 U.S.C. section 360bbb-3(b)(1), unless the authorization is terminated or revoked.     Resp Syncytial Virus by PCR NEGATIVE NEGATIVE Final    Comment: (NOTE) Fact Sheet for Patients: EntrepreneurPulse.com.au  Fact Sheet for Healthcare Providers: IncredibleEmployment.be  This test is not yet approved or cleared by the [place] FDA and has been authorized for detection and/or diagnosis of SARS-CoV-2 by FDA under an Emergency Use Authorization (EUA). This EUA will remain in effect (meaning this test can be used) for the duration of the COVID-19 declaration under Section 564(b)(1) of the Act, 21 U.S.C. section 360bbb-3(b)(1),  unless the authorization is terminated or revoked.  Performed at [facility], [place] [address] [place], [place] [address]   Culture, blood (routine x 2)     Status: None   Collection Time: 03/19/22  1:29 PM   Specimen: BLOOD  Result Value Ref Range Status   Specimen Description   Final    BLOOD LEFT ANTECUBITAL Performed at [place] [address] [place], [place] [address]    Special Requests   Final    BOTTLES DRAWN AEROBIC AND ANAEROBIC Blood Culture adequate volume Performed at [place] [address], [place], [place] [address]    Culture   Final    NO GROWTH 5 DAYS Performed at [facility], [place] [address] [place], [place] [address]    Report Status 03/24/2022 FINAL  Final  Culture, blood (routine x 2)     Status: Abnormal (Preliminary result)   Collection Time: 03/19/22  2:00 PM   Specimen: BLOOD  Result Value Ref Range Status   Specimen Description   Final    BLOOD RIGHT ANTECUBITAL Performed at [facility],  [place] [address] [place], [place] [address]    Special Requests   Final    BOTTLES DRAWN AEROBIC AND ANAEROBIC Blood Culture results may not be optimal due to an excessive volume of blood received in culture bottles Performed at [place] [address] [place], [place] [address]    Culture  Setup Time   Final    GRAM NEGATIVE RODS ANAEROBIC BOTTLE ONLY CRITICAL RESULT CALLED TO, READ BACK BY AND VERIFIED WITH:  C/ PHARMD M. BELL 03/20/22 1342 A. LAFRANCE    Culture (A)  Final    PSEUDOMONAS AERUGINOSA Sent to [place] for further susceptibility testing. Performed at [facility], [place] [address] [place],  [address]    Report Status PENDING  Incomplete   Organism ID, Bacteria PSEUDOMONAS AERUGINOSA  Final      Susceptibility   Pseudomonas aeruginosa - MIC*    CEFTAZIDIME 2 SENSITIVE Sensitive     CIPROFLOXACIN >=4 RESISTANT  Resistant     GENTAMICIN <=1 SENSITIVE Sensitive     IMIPENEM 1 SENSITIVE Sensitive     PIP/TAZO 8 SENSITIVE Sensitive     * PSEUDOMONAS AERUGINOSA  Blood Culture ID Panel (Reflexed)     Status: Abnormal   Collection Time: 03/19/22  2:00 PM  Result Value Ref Range Status   Enterococcus faecalis NOT DETECTED NOT DETECTED Final   Enterococcus Faecium NOT DETECTED NOT DETECTED Final   Listeria monocytogenes NOT DETECTED NOT DETECTED Final   Staphylococcus species NOT DETECTED NOT DETECTED Final   Staphylococcus aureus (BCID) NOT DETECTED NOT DETECTED Final   Staphylococcus epidermidis NOT DETECTED NOT DETECTED Final   Staphylococcus lugdunensis NOT DETECTED NOT DETECTED Final   Streptococcus species NOT DETECTED NOT DETECTED Final   Streptococcus agalactiae NOT DETECTED NOT DETECTED Final   Streptococcus pneumoniae NOT DETECTED NOT DETECTED Final   Streptococcus pyogenes NOT DETECTED NOT DETECTED Final   A.calcoaceticus-baumannii NOT DETECTED NOT DETECTED Final   Bacteroides fragilis NOT DETECTED NOT DETECTED Final   Enterobacterales NOT DETECTED NOT DETECTED Final   Enterobacter cloacae complex NOT DETECTED NOT DETECTED Final   Escherichia coli NOT DETECTED NOT DETECTED Final   Klebsiella aerogenes NOT DETECTED NOT DETECTED Final   Klebsiella oxytoca NOT DETECTED NOT DETECTED Final   Klebsiella pneumoniae NOT DETECTED NOT DETECTED Final   Proteus species NOT DETECTED NOT DETECTED Final   Salmonella species NOT DETECTED NOT DETECTED Final   Serratia marcescens NOT DETECTED NOT DETECTED Final   Haemophilus influenzae NOT DETECTED NOT DETECTED Final   Neisseria meningitidis NOT DETECTED NOT DETECTED Final   Pseudomonas aeruginosa DETECTED (A) NOT DETECTED Final    Comment: CRITICAL RESULT CALLED TO, READ BACK BY AND VERIFIED WITH:  C/ PHARMD M. BELL 03/20/22 1342 A. LAFRANCE    Stenotrophomonas maltophilia NOT DETECTED NOT DETECTED Final   Candida albicans NOT DETECTED NOT DETECTED  Final   Candida auris NOT DETECTED NOT DETECTED Final   Candida glabrata NOT DETECTED NOT DETECTED Final   Candida krusei NOT DETECTED NOT DETECTED Final   Candida parapsilosis NOT DETECTED NOT DETECTED Final   Candida tropicalis NOT DETECTED NOT DETECTED Final   Cryptococcus neoformans/gattii NOT DETECTED NOT DETECTED Final   CTX-M ESBL NOT DETECTED NOT DETECTED Final   Carbapenem resistance IMP NOT DETECTED NOT DETECTED Final   Carbapenem resistance KPC NOT DETECTED NOT DETECTED Final   Carbapenem resistance NDM NOT DETECTED NOT DETECTED Final   Carbapenem resistance VIM NOT DETECTED NOT DETECTED Final  Comment: Performed at [facility], [place] [address] [place], [place] [address]  MRSA Next Gen by PCR, Nasal     Status: None   Collection Time: 03/19/22  6:58 PM   Specimen: Nasal Mucosa; Nasal Swab  Result Value Ref Range Status   MRSA by PCR Next Gen NOT DETECTED NOT DETECTED Final    Comment: (NOTE) The GeneXpert MRSA Assay (FDA approved for NASAL specimens only), is one component of a comprehensive MRSA colonization surveillance program. It is not intended to diagnose MRSA infection nor to guide or monitor treatment for MRSA infections. Test performance is not FDA approved in patients less than 64 years old. Performed at [facility], [place] [address], [place], [place] [address]   Urine Culture     Status: Abnormal   Collection Time: 03/20/22 12:32 AM   Specimen: Urine, Clean Catch  Result Value Ref Range Status   Specimen Description   Final    URINE, CLEAN CATCH Performed at [facility], [place] [address], [place], [place] [address]    Special Requests   Final    NONE Performed at [facility], [place] [address] [place], [place] [address]    Culture (A)  Final    <10,000 COLONIES/mL INSIGNIFICANT GROWTH Performed at [place] [address], [place], [place] [address]    Report Status 03/21/2022  FINAL  Final  C Difficile Quick Screen w PCR reflex     Status: None   Collection Time: 03/20/22  6:46 PM   Specimen: STOOL  Result Value Ref Range Status   C Diff antigen NEGATIVE NEGATIVE Final   C Diff toxin NEGATIVE NEGATIVE Final   C Diff interpretation No C. difficile detected.  Final    Comment: Performed at [facility], [place] [address] [place], [place] [address]     Patient was seen and examined on the day of discharge and was found to be in stable condition. Time coordinating discharge: 40 minutes including assessment and coordination of care, as well as examination of the patient.   SIGNED:  Dessa Phi, DO [facility] 03/25/2022, 2:18 PM

## 2022-03-25 NOTE — Plan of Care (Signed)

## 2022-03-25 NOTE — TOC Transition Note (Signed)
Transition of Care [facility]) - CM/SW Discharge Note   Patient Details  Name: Christina Mack MRN: [id number] Date of Birth: 06/01/1965  Transition [facility] CM/SW Contact:  Henrietta Dine, RN Phone Number: [phone] 03/25/2022, 2:03 PM   Clinical Narrative:    Josem Kaufmann was received; she also says the pt's doses are mixed and will be delivered this pm; Pam also says the pt needs to receive afternoon doses before leaving hospital for tomorrow's doses per [facility]; HHPT/RN previously arranged w/ [facility]; [facility] notified of pt d/c today; no TOC needs.  Final next level of care: [place] Barriers to Discharge: Equipment Delay   Patient Goals and CMS Choice   Choice offered to / list presented to : Patient  Discharge Placement                         Discharge Plan and Services Additional resources added to the After Visit Summary for   In-house Referral: NA Discharge Planning Services: CM Consult Post Acute Care Choice: Durable Medical Equipment, Home Health          DME Arranged: IV pump/equipment DME Agency: Other - Comment Ysidro Evert) Date DME Agency Contacted: 03/24/22 Time DME Agency Contacted: 1227 Representative spoke with at DME Agency: Carolynn Sayers HH Arranged: PT, RN [facility] Agency: [place] Date [place]: 03/24/22 Time [place]: 1228 Representative spoke with at [place]: [place] Determinants of Health ([place]) Interventions Halfway House: No Food Insecurity (03/20/2022)  Housing: Low Risk  (03/20/2022)  Transportation Needs: No Transportation Needs (03/20/2022)  Utilities: Not At Risk (03/20/2022)  Depression (PHQ2-9): Low Risk  (11/10/2021)  Tobacco Use: Low Risk  (03/24/2022)     Readmission Risk Interventions    03/24/2022   11:54 AM  Readmission Risk Prevention Plan  Transportation Screening Complete  Medication Review ([place]) Complete  PCP or Specialist  appointment within 3-5 days of discharge Complete  HRI or [place] Complete  SW Recovery Care/Counseling Consult Complete  [place] Not Applicable

## 2022-03-25 NOTE — Progress Notes (Signed)
Patient ID: Christina Mack, female   DOB: 1965-07-06, 56 y.o.   MRN: [id number] S: Anxious to go home, didn't sleep well last night. O:BP (!) 169/98 (BP Location: Left Arm)   Pulse 87   Temp 98.1 F (36.7 C) (Oral)   Resp 16   Ht _0  (1.651 m)   Wt 66.6 kg   SpO2 100%   BMI 24.43 kg/m   Intake/Output Summary (Last 24 hours) at 03/25/2022 1129 Last data filed at 03/25/2022 1013 Gross per 24 hour  Intake 341.68 ml  Output 1850 ml  Net -1508.32 ml   Intake/Output: I/O last 3 completed shifts: In: 901.7 [P.O.:800; I.V.:0.2; IV Piggyback:101.4] Out: 2000 [Urine:1900; Stool:100]  Intake/Output this shift:  Total I/O In: 240 [P.O.:240] Out: 450 [Urine:450] Weight change: 1.2 kg Gen: Chronically ill-appearing CVS: RRR Resp: CTA Abd: +BS, soft, NT/ND Ext: no edema  Recent Labs  Lab 03/19/22 1202 03/19/22 2157 03/20/22 0304 03/21/22 0253 03/22/22 0247 03/23/22 0250 03/24/22 0536 03/25/22 0402  NA 136 139 138 140 139 136 134* 137  K 6.7* 5.3* 4.5 3.8 3.3* 3.2* 3.0* 3.1*  CL 111 112* 112* 112* 111 109 109 115*  CO2 <7* 9* 11* 12* 14* 14* 13* 13*  GLUCOSE 99 139* 150* 130* 124* 125* 106* 110*  BUN 93* 89* 89* 92* 98* 107* 113* 115*  CREATININE 9.30* 8.49* 8.35* 7.88* 7.83* 7.36* 6.97* 6.88*  ALBUMIN 3.4*  --   --  2.7* 2.2* 2.5* 2.6* 2.4*  CALCIUM 7.9* 7.3* 6.9* 6.6* 6.9* 7.2* 7.4* 7.4*  PHOS  --   --   --  7.1* 6.6* 5.5* 4.0 4.2  AST 22  --   --   --   --   --   --   --   ALT 9  --   --   --   --   --   --   --    Liver Function Tests: Recent Labs  Lab 03/19/22 1202 03/21/22 0253 03/23/22 0250 03/24/22 0536 03/25/22 0402  AST 22  --   --   --   --   ALT 9  --   --   --   --   ALKPHOS 59  --   --   --   --   BILITOT 0.7  --   --   --   --   PROT 7.7  --   --   --   --   ALBUMIN 3.4*   < > 2.5* 2.6* 2.4*   < > = values in this interval not displayed.   Recent Labs  Lab 03/19/22 1202  LIPASE 83*   No results for input(s): "AMMONIA" in the last 168  hours. CBC: Recent Labs  Lab 03/19/22 1202 03/19/22 2157 03/20/22 1535 03/21/22 0253 03/21/22 1148 03/22/22 0247 03/22/22 1634 03/23/22 0250 03/24/22 0536 03/25/22 0402  WBC 3.6*   < > 1.0* 1.2*  --  1.2*  --  1.0* 1.9* 2.1*  NEUTROABS 2.3  --  0.6*  --   --   --   --   --   --   --   HGB 6.4*   < > 7.2* 6.9*   < > 7.7*  --  7.9* 7.2* 6.5*  HCT 21.0*   < > 22.4* 21.3*   < > 23.6*  --  23.9*  22.9* 22.3* 20.2*  MCV 96.3   < > 93.3 92.2  --  92.5  --  92.6  94.9 94.0  PLT 31*   < > 30* 27*  --  18*   < > 21* 22* 26*   < > = values in this interval not displayed.   Cardiac Enzymes: Recent Labs  Lab 03/23/22 1428  CKTOTAL 15*   CBG: Recent Labs  Lab 03/19/22 1455  GLUCAP 82    Iron Studies:  Recent Labs    03/23/22 0250  FERRITIN 4,472*   Studies/Results: IR Fluoro Guide CV Line Right  Result Date: 03/24/2022 INDICATION: Poor venous access. In need of durable intravenous access for antibiotic administration. 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 As such, request made for placement of a tunneled central venous catheter. EXAM: ULTRASOUND AND FLUOROSCOPIC GUIDED PLACEMENT OF TUNNELED CENTRAL VENOUS CATHETER MEDICATIONS: None. The patient is currently admitted to the hospital receiving intravenous antibiotics. The antibiotic was given in an appropriate time interval prior to skin puncture. ANESTHESIA/SEDATION: Moderate (conscious) sedation was employed during this procedure as administered by the Interventional Radiology RN. A total of Benadryl 25 mg and Fentanyl 100 mcg was administered intravenously. Moderate Sedation Time: 10 minutes. The patient's level of consciousness and vital signs were monitored continuously by radiology nursing throughout the procedure under my direct supervision. FLUOROSCOPY TIME:  30 seconds (1 mGy) COMPLICATIONS: None immediate. PROCEDURE: Informed written consent was obtained from the patient after a discussion of  the risks, benefits, and alternatives to treatment. Questions regarding the procedure were encouraged and answered. The right neck and chest were prepped with chlorhexidine in a sterile fashion, and a sterile drape was applied covering the operative field. Maximum barrier sterile technique with sterile gowns and gloves were used for the procedure. A timeout was performed prior to the initiation of the procedure. After the overlying soft tissues were anesthetized, a small venotomy incision was created and a micropuncture kit was utilized to access the internal jugular vein. Real-time ultrasound guidance was utilized for vascular access including the acquisition of a permanent ultrasound image documenting patency of the accessed vessel. The microwire was utilized to measure appropriate catheter length. The micropuncture sheath was exchanged for a peel-away sheath over a guidewire. A 5 [place] dual lumen tunneled central venous catheter measuring 22 cm was tunneled in a retrograde fashion from the anterior chest wall to the venotomy incision. The catheter was then placed through the peel-away sheath with tip ultimately positioned at the superior caval-atrial junction. Final catheter positioning was confirmed and documented with a spot radiographic image. The catheter aspirates and flushes normally. The catheter was flushed with appropriate volume heparin dwells. The catheter exit site was secured with a 0-Prolene retention suture. The venotomy incision was closed with Dermabond and Steri-strips. Dressings were applied. The patient tolerated the procedure well without immediate post procedural complication. FINDINGS: After catheter placement, the tip lies within the superior cavoatrial junction. The catheter aspirates and flushes normally and is ready for immediate use. IMPRESSION: Successful placement of 22 cm dual lumen tunneled central venous catheter via the right internal jugular vein with tip terminating at the  superior caval atrial junction. The catheter is ready for immediate use. Electronically Signed   By: Sandi Mariscal M.D.   On: 03/24/2022 12:25   IR US Guide Vasc Access Right  Result Date: 03/24/2022 INDICATION: Poor venous access. In need of durable intravenous access for antibiotic administration. 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 As such, request made for placement of a tunneled central venous catheter. EXAM: ULTRASOUND AND FLUOROSCOPIC GUIDED  PLACEMENT OF TUNNELED CENTRAL VENOUS CATHETER MEDICATIONS: None. The patient is currently admitted to the hospital receiving intravenous antibiotics. The antibiotic was given in an appropriate time interval prior to skin puncture. ANESTHESIA/SEDATION: Moderate (conscious) sedation was employed during this procedure as administered by the Interventional Radiology RN. A total of Benadryl 25 mg and Fentanyl 100 mcg was administered intravenously. Moderate Sedation Time: 10 minutes. The patient's level of consciousness and vital signs were monitored continuously by radiology nursing throughout the procedure under my direct supervision. FLUOROSCOPY TIME:  30 seconds (1 mGy) COMPLICATIONS: None immediate. PROCEDURE: Informed written consent was obtained from the patient after a discussion of the risks, benefits, and alternatives to treatment. Questions regarding the procedure were encouraged and answered. The right neck and chest were prepped with chlorhexidine in a sterile fashion, and a sterile drape was applied covering the operative field. Maximum barrier sterile technique with sterile gowns and gloves were used for the procedure. A timeout was performed prior to the initiation of the procedure. After the overlying soft tissues were anesthetized, a small venotomy incision was created and a micropuncture kit was utilized to access the internal jugular vein. Real-time ultrasound guidance was utilized for vascular access including the  acquisition of a permanent ultrasound image documenting patency of the accessed vessel. The microwire was utilized to measure appropriate catheter length. The micropuncture sheath was exchanged for a peel-away sheath over a guidewire. A 5 [place] dual lumen tunneled central venous catheter measuring 22 cm was tunneled in a retrograde fashion from the anterior chest wall to the venotomy incision. The catheter was then placed through the peel-away sheath with tip ultimately positioned at the superior caval-atrial junction. Final catheter positioning was confirmed and documented with a spot radiographic image. The catheter aspirates and flushes normally. The catheter was flushed with appropriate volume heparin dwells. The catheter exit site was secured with a 0-Prolene retention suture. The venotomy incision was closed with Dermabond and Steri-strips. Dressings were applied. The patient tolerated the procedure well without immediate post procedural complication. FINDINGS: After catheter placement, the tip lies within the superior cavoatrial junction. The catheter aspirates and flushes normally and is ready for immediate use. IMPRESSION: Successful placement of 22 cm dual lumen tunneled central venous catheter via the right internal jugular vein with tip terminating at the superior caval atrial junction. The catheter is ready for immediate use. Electronically Signed   By: Sandi Mariscal M.D.   On: 03/24/2022 12:25   IR TUNNELED CENTRAL VENOUS CATHETER PLACEMENT  Result Date: 03/24/2022 INDICATION: Poor venous access. In need of durable intravenous access for antibiotic administration. 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 As such, request made for placement of a tunneled central venous catheter. EXAM: ULTRASOUND AND FLUOROSCOPIC GUIDED PLACEMENT OF TUNNELED CENTRAL VENOUS CATHETER MEDICATIONS: None. The patient is currently admitted to the hospital receiving intravenous antibiotics.  The antibiotic was given in an appropriate time interval prior to skin puncture. ANESTHESIA/SEDATION: Moderate (conscious) sedation was employed during this procedure as administered by the Interventional Radiology RN. A total of Benadryl 25 mg and Fentanyl 100 mcg was administered intravenously. Moderate Sedation Time: 10 minutes. The patient's level of consciousness and vital signs were monitored continuously by radiology nursing throughout the procedure under my direct supervision. FLUOROSCOPY TIME:  30 seconds (1 mGy) COMPLICATIONS: None immediate. PROCEDURE: Informed written consent was obtained from the patient after a discussion of the risks, benefits, and alternatives to treatment. Questions regarding the procedure were encouraged and answered. The right  neck and chest were prepped with chlorhexidine in a sterile fashion, and a sterile drape was applied covering the operative field. Maximum barrier sterile technique with sterile gowns and gloves were used for the procedure. A timeout was performed prior to the initiation of the procedure. After the overlying soft tissues were anesthetized, a small venotomy incision was created and a micropuncture kit was utilized to access the internal jugular vein. Real-time ultrasound guidance was utilized for vascular access including the acquisition of a permanent ultrasound image documenting patency of the accessed vessel. The microwire was utilized to measure appropriate catheter length. The micropuncture sheath was exchanged for a peel-away sheath over a guidewire. A 5 [place] dual lumen tunneled central venous catheter measuring 22 cm was tunneled in a retrograde fashion from the anterior chest wall to the venotomy incision. The catheter was then placed through the peel-away sheath with tip ultimately positioned at the superior caval-atrial junction. Final catheter positioning was confirmed and documented with a spot radiographic image. The catheter aspirates and  flushes normally. The catheter was flushed with appropriate volume heparin dwells. The catheter exit site was secured with a 0-Prolene retention suture. The venotomy incision was closed with Dermabond and Steri-strips. Dressings were applied. The patient tolerated the procedure well without immediate post procedural complication. FINDINGS: After catheter placement, the tip lies within the superior cavoatrial junction. The catheter aspirates and flushes normally and is ready for immediate use. IMPRESSION: Successful placement of 22 cm dual lumen tunneled central venous catheter via the right internal jugular vein with tip terminating at the superior caval atrial junction. The catheter is ready for immediate use. Electronically Signed   By: Sandi Mariscal M.D.   On: 03/24/2022 12:25   [place] EKG SITE RITE  Result Date: 03/23/2022 If Site Rite image not attached, placement could not be confirmed due to current cardiac rhythm.   carvedilol  12.5 mg Oral BID WC   Chlorhexidine Gluconate Cloth  6 each Topical Daily   hydrocortisone sodium succinate  50 mg Intravenous Daily   mouth rinse  15 mL Mouth Rinse 4 times per day   pantoprazole (PROTONIX) IV  40 mg Intravenous Q12H    BMET    Component Value Date/Time   NA 137 03/25/2022 0402   NA 139 11/26/2012 1758   K 3.1 (L) 03/25/2022 0402   K 4.6 11/26/2012 1758   CL 115 (H) 03/25/2022 0402   CL 105 11/26/2012 1758   CO2 13 (L) 03/25/2022 0402   CO2 22 11/26/2012 1758   GLUCOSE 110 (H) 03/25/2022 0402   GLUCOSE 88 11/26/2012 1758   BUN 115 (H) 03/25/2022 0402   BUN 33 (H) 11/26/2012 1758   CREATININE 6.88 (H) 03/25/2022 0402   CREATININE 2.93 (H) 06/08/2020 1544   CALCIUM 7.4 (L) 03/25/2022 0402   CALCIUM 9.7 11/26/2012 1758   GFRNONAA 7 (L) 03/25/2022 0402   GFRNONAA 19 (L) 11/26/2012 1758   GFRAA 22 (L) 11/26/2012 1758   CBC    Component Value Date/Time   WBC 2.1 (L) 03/25/2022 0402   RBC 2.15 (L) 03/25/2022 0402   HGB 6.5 (LL) 03/25/2022  0402   HGB 10.5 (L) 11/26/2012 1758   HCT 20.2 (L) 03/25/2022 0402   HCT 22.9 (L) 03/23/2022 0250   PLT 26 (LL) 03/25/2022 0402   PLT 190 11/26/2012 1758   MCV 94.0 03/25/2022 0402   MCV 89 11/26/2012 1758   MCH 30.2 03/25/2022 0402   MCHC 32.2 03/25/2022 0402   RDW 16.6 (  H) 03/25/2022 0402   RDW 13.2 11/26/2012 1758   LYMPHSABS 0.3 (L) 03/20/2022 1535   LYMPHSABS 2.4 11/26/2012 1758   MONOABS 0.1 03/20/2022 1535   MONOABS 0.7 11/26/2012 1758   EOSABS 0.0 03/20/2022 1535   EOSABS 0.1 11/26/2012 1758   BASOSABS 0.0 03/20/2022 1535   BASOSABS 0.0 11/26/2012 1758     Assessment/ Plan: AKI on CKD 5 - b/l creat 5.72- 7.10, from aug - nov 2023, eGFR 6- 8 ml/min. Creat 9.30 on admission.  AKI likely multifactorial with ischemic ATN in setting of several days of poor po intake and recurrent vomiting/ dry heaves as well as ABLA due to GI bleed. UA  with large blood and protein.  Transplant renal US with moderate hydronephrosis but normal resistive indices.  CXR neg, no vol excess on exam. She is  now getting bicarb gtt at 100 cc/hr. Would continue. Suspect AKI due to n/v and vol depletion, vs progression to end-stage renal failure w/ uremia causing nausea/ vomiting. No neuro issues, will not proceed w/ dialysis yet. Place foley cath. IVF"s have improved BUN/Cr from admission but still with low UOP.  Her baseline function is very poor and it may be that we will need to start dialysis regardless of the treatments. I did discuss the eventual need to resume dialysis which she acknowledges, however despite a low eGFR, she had not had vascular access placed in preparation.  She was referred to [facility] for a third transplant, however is high risk due to h/o ongoing osteomyelitis, so don't think she will be eligible.  We discussed RRT and she understands that she will need to start HD.  Would place [facility] when her platelets are suitable/safe, however she would like to wait until she is seen by [facility] transplant and  Dr. Joelyn Oms on 04/18/22 before agreeing to starting HD.  Currently without overt uremic symptoms.  Continue with supportive care until we can safely place [facility] to initiate HD.  BUN climbing likely due to diarrhea, may benefit from some gentle IVF"s.  Scr has slowly been improving.  Should be ok to discharge to home after blood transfusion and KCl replacement.  Has outpatient follow up arranged. Avoid nephrotoxic medications including NSAIDs and iodinated intravenous contrast exposure unless the latter is absolutely indicated.   Preferred narcotic agents for pain control are hydromorphone, fentanyl, and methadone. Morphine should not be used.  Avoid Baclofen and avoid oral sodium phosphate and magnesium citrate based laxatives / bowel preps.  Continue strict Input and Output monitoring. Will monitor the patient closely with you and intervene or adjust therapy as indicated by changes in clinical status/labs  Volume - looks quite dry on exam. IVF"s as above.  Acute blood loss anemia on Anemia of CKD stage 5- she was transfused 1 unit PRBC early this morning.  Had high tsat 95% last admission in Nov 2023, and ferritin high at 1352.  No need for IV Fe.  Required another transfusion this morning.  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  Declined foley or suprapubic catheter placement.  Pancytopenia - plts 35K, WBC low. Per pmd.  Getting platelet transfusion due to GI bleed.   Renal transplant - in 2014. 2nd transplant. Cont to hold prograf given leukopenia. Pancytopenia - per CCM they suspect this could be due to her prolonged zyvox exposure.  Continue to transfuse platelets and PRBC's as needed.  Consider Heme evaluation.  ID - some signs of sepsis like tachypnea and  tachycardia. Started on IV vanc and cefepime. Blood / urine cx's ordered and pending.   Now off of vanco and on doxycycline and cefepime. 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 Had been on zyvox and moxifloxacin po. Now on doxy and cefepime. SP recent R THA and tibial plateau fracture Oct 2023  Donetta Potts, MD [facility]

## 2022-03-25 NOTE — TOC Progression Note (Addendum)
Transition of Care [facility]) - Progression Note    Patient Details  Name: KARMAH POTOCKI MRN: [id number] Date of Birth: 1965/12/05  Transition [facility]) CM/SW Contact  Henrietta Dine, RN Phone Number: [phone] 03/25/2022, 12:05 PM  Clinical Narrative:    Hulen Sk[facility] to obtain status of prior auth for home IV abx; message says called outside normal business hours; unable to leave message; also LVM for Carolynn Sayers at [facility] to inquire if she has received update; Dr Maylene Roes notified.  [phone] callback by Carolynn Sayers at [facility]; she says Josem Kaufmann was received; she also says the pt's doses are mixed and will be delivered this pm; Pam also says the pt needs to receive afternoon doses before leaving hospital for tomorrow's doses per [employer]; HHPT/RN previously arranged w/ [facility]; [facility] notified of pt d/c today; no TOC needs.   Expected Discharge Plan: [place] Barriers to Discharge: Equipment Delay  Expected Discharge Plan and Services In-house Referral: NA Discharge Planning Services: CM Consult Post Acute Care Choice: Durable Medical Equipment, Home Health Living arrangements for the past 2 months: Single Family Home                 DME Arranged: IV pump/equipment DME Agency: Other - Comment Ysidro Evert) Date DME Agency Contacted: 03/24/22 Time DME Agency Contacted: 1227 Representative spoke with at DME Agency: Carolynn Sayers HH Arranged: PT, RN [facility] Agency: Ashley Date [place]: 03/24/22 Time [place]: 1228 Representative spoke with at [place]: Alex Determinants of Health ([place]) Interventions [place]: No Food Insecurity (03/20/2022)  Housing: Low Risk  (03/20/2022)  Transportation Needs: No Transportation Needs (03/20/2022)  Utilities: Not At Risk (03/20/2022)  Depression (PHQ2-9): Low Risk  (11/10/2021)  Tobacco Use: Low Risk  (03/24/2022)    Readmission Risk  Interventions    03/24/2022   11:54 AM  Readmission Risk Prevention Plan  Transportation Screening Complete  Medication Review ([place]) Complete  PCP or Specialist appointment within 3-5 days of discharge Complete  HRI or [place] Complete  SW Recovery Care/Counseling Consult Complete  [place] Not Applicable

## 2022-03-27 LAB — TYPE AND SCREEN
ABO/RH(D): O POS
Antibody Screen: NEGATIVE
Unit division: 0

## 2022-03-27 LAB — BPAM RBC
Blood Product Expiration Date: 202401252359
ISSUE DATE / TIME: 202312231102
Unit Type and Rh: 5100

## 2022-03-30 LAB — MINIMUM INHIBITORY CONC. (1 DRUG): Source CRE: 96388

## 2022-03-30 LAB — MIC RESULT

## 2022-03-31 LAB — CULTURE, BLOOD (ROUTINE X 2)

## 2022-04-01 ENCOUNTER — Encounter: Payer: Self-pay | Admitting: Gynecologic Oncology

## 2022-04-04 ENCOUNTER — Telehealth: Payer: Self-pay | Admitting: Oncology

## 2022-04-04 DIAGNOSIS — M8660 Other chronic osteomyelitis, unspecified site: Secondary | ICD-10-CM

## 2022-04-04 DIAGNOSIS — M86151 Other acute osteomyelitis, right femur: Secondary | ICD-10-CM

## 2022-04-04 DIAGNOSIS — C519 Malignant neoplasm of vulva, unspecified: Secondary | ICD-10-CM

## 2022-04-04 NOTE — Telephone Encounter (Signed)
Called Christina Mack regarding her [employer].  She has been trying to get an appointment with [facility] since September without success.  She would like to have a referral placed with [facility].  Advised her that we will enter the referral today and to expect a call.

## 2022-04-07 ENCOUNTER — Telehealth: Payer: Self-pay | Admitting: Oncology

## 2022-04-07 NOTE — Telephone Encounter (Signed)
Called Christina Mack and advised her that [facility] would like her to go to a wound center.  [name] said she has already had 60 hyperbaric treatments at [facility].  Advised her that I will call [facility] on Monday to check on her referral there.

## 2022-04-11 ENCOUNTER — Telehealth: Payer: Self-pay

## 2022-04-11 ENCOUNTER — Ambulatory Visit: Admitting: Infectious Diseases

## 2022-04-11 ENCOUNTER — Other Ambulatory Visit: Payer: Self-pay

## 2022-04-11 ENCOUNTER — Encounter: Payer: Self-pay | Admitting: Infectious Diseases

## 2022-04-11 VITALS — BP 153/91 | HR 92 | Temp 97.8°F | Wt 138.0 lb

## 2022-04-11 DIAGNOSIS — M86151 Other acute osteomyelitis, right femur: Secondary | ICD-10-CM | POA: Diagnosis not present

## 2022-04-11 DIAGNOSIS — S31109S Unspecified open wound of abdominal wall, unspecified quadrant without penetration into peritoneal cavity, sequela: Secondary | ICD-10-CM

## 2022-04-11 DIAGNOSIS — M8660 Other chronic osteomyelitis, unspecified site: Secondary | ICD-10-CM

## 2022-04-11 DIAGNOSIS — D071 Carcinoma in situ of vulva: Secondary | ICD-10-CM

## 2022-04-11 DIAGNOSIS — S31109D Unspecified open wound of abdominal wall, unspecified quadrant without penetration into peritoneal cavity, subsequent encounter: Secondary | ICD-10-CM | POA: Diagnosis not present

## 2022-04-11 DIAGNOSIS — Z96641 Presence of right artificial hip joint: Secondary | ICD-10-CM

## 2022-04-11 DIAGNOSIS — N184 Chronic kidney disease, stage 4 (severe): Secondary | ICD-10-CM

## 2022-04-11 DIAGNOSIS — B965 Pseudomonas (aeruginosa) (mallei) (pseudomallei) as the cause of diseases classified elsewhere: Secondary | ICD-10-CM

## 2022-04-11 DIAGNOSIS — Z452 Encounter for adjustment and management of vascular access device: Secondary | ICD-10-CM

## 2022-04-11 NOTE — Progress Notes (Signed)
Patient: Christina Mack  DOB: [birth year] MRN: [id number] PCP: System, Provider Not In    Subjective:   Chief Complaint  Patient presents with   Follow-up     HPI:  [name] is here for hospital discharge follow up. She was hospitalized at [facility] for weakness, acute on chronic renal failure and severe TTP/anemia from 12/17 - 03/25/22.  Unfortunately she sustained closed fracture of the neck of her right femur 01/19/22 requiring total hip replacement with Dr. Erlinda Hong. At some point during her follow up with ortho she was placed back on linezolid BID and moxifloxacin post op by ortho team out of concern for keeping her new hip free from infection. Unfortunately she had breakthrough bacteremia with pseudomonas (R-quinolones) along with severe thrombocytopenia, leucopenia and anemia likely due to s/e from chronic linezolid in the setting of worsening renal function. She was seen by Dr. 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 She had tunneled central line placed on 12/22 in IR. She required blood transfusions during hospital stay and renal function at discharte with creatinine 7.6. There has been concern over needing to resume dialysis for her given failing graft. She has follow up with her transplant team at [facility] this week.  Out of concern and attempt to determine further if Rt hip hardware is infected, a bone scan was performed 03/22/22 and revealed NO CONCERN for right hip involvement.  Dapto + merrem through 05/03/2022  She is worried about needing a blood transfusion. She is very frustrated at the challenges in getting in to see Dr. Cherre Blanc at [facility] ortho onc and says they want her to see urology first before they will see her. It is not clear as to why to [facility] and she has not been able to secure an appointment with Dr. Terance Hart [facility]) yet. She has long since completed all 60 HBOT sessions. Attempted to see plastics  here at [facility] but they recommended she see wound care and HBOT without seeing her.   Doing well with current IV antibiotics. No significant GI side effects - still with some urgency with defecating but no loss of control or diarrhea like before.  Passing urine OK. She uses a walker today and has started home physical therapy. Her right hip does not hurt and actually feels her left hip is more stressed from accommodating.  She has a family member present today, [name].     Review of Systems  Constitutional:  Negative for chills, fever and weight loss.  Cardiovascular:  Negative for leg swelling.  Gastrointestinal:  Negative for abdominal pain, diarrhea, nausea and vomiting.  Musculoskeletal:  Positive for back pain and joint pain.  Skin:  Negative for rash.      Past Medical History:  Diagnosis Date   Complication of anesthesia    nausea and vomiting   DVT (deep venous thrombosis) (HCC)    History of osteomyelitis    Kidney transplant recipient    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[facility])     Outpatient Medications Prior to Visit  Medication Sig Dispense Refill   acetaminophen (TYLENOL) 500 MG tablet Take 2 tablets (1,000 mg total) by mouth 3 (three) times daily. (Patient taking differently: Take 1,000 mg by mouth 3 (three) times daily as needed for moderate pain.) 30 tablet 0   carvedilol (COREG) 12.5 MG tablet Take 1 tablet (12.5 mg total) by mouth 2 (two) times daily with a meal. 60  tablet 0   daptomycin (CUBICIN) IVPB Inject 500 mg into the vein every other day. Indication:  pseudomonas bacteremia and pelvic osteomyelitis First Dose: Yes Last Day of Therapy:  05/03/22 Labs - Once weekly:  CBC/D, CMP, CPK, ESR and CRP Please leave PIC in place until doctor has seen patient or been notified Fax weekly labs to [phone] Method of administration: IV Push Method of administration may be changed at the discretion of home infusion pharmacist based upon assessment of the patient and/or caregiver's  ability to self-administer the medication ordered. 19 Units 0   meropenem (MERREM) IVPB Inject 500 mg into the vein daily. Indication:  pseudomonas bacteremia and pelvic osteomyelitis First Dose: Yes Last Day of Therapy:  05/03/22 Labs - Once weekly:  CBC/D, CMP, CPK, ESR and CRP Please leave PIC in place until doctor has seen patient or been notified Fax weekly labs to [phone] Method of administration: Mini-Bag Plus / Gravity Method of administration may be changed at the discretion of home infusion pharmacist based upon assessment of the patient and/or caregiver's ability to self-administer the medication ordered. 40 Units 0   predniSONE (DELTASONE) 5 MG tablet Take 5 mg by mouth daily with breakfast.     sodium bicarbonate 650 MG tablet Take 1 tablet (650 mg total) by mouth 3 (three) times daily. 90 tablet 0   HYDROcodone-acetaminophen (NORCO) 7.5-325 MG tablet Take 1-2 tablets by mouth 3 (three) times daily as needed for moderate pain. 30 tablet 0   pantoprazole (PROTONIX) 40 MG tablet TAKE 1 TABLET (40 MG TOTAL) BY MOUTH 2 (TWO) TIMES DAILY BEFORE A MEAL. (Patient taking differently: Take 40 mg by mouth 2 (two) times daily.) 60 tablet 0   No facility-administered medications prior to visit.     Allergies  Allergen Reactions   Oxycodone-Acetaminophen Itching, Nausea And Vomiting, Nausea Only, Rash and Other (See Comments)    Headaches, also    Piperacillin-Tazobactam In Dex     Other Reaction(s): Other (See Comments)  Thrombocytopenia  Thrombocytopenia   Zosyn [Piperacillin Sod-Tazobactam So] Other (See Comments)    Thrombocytopenia   Hydromorphone Nausea And Vomiting, Rash and Other (See Comments)   Amlodipine Other (See Comments) and Swelling    Ankle swelling   Codeine     vomiting    Social History   Tobacco Use   Smoking status: Never   Smokeless tobacco: Never  Vaping Use   Vaping Use: Never used  Substance Use Topics   Alcohol use: Not Currently   Drug  use: Never     Objective:   Vitals:   04/11/22 0833  BP: (!) 153/91  Pulse: 92  Temp: 97.8 F (36.6 C)  TempSrc: Oral  SpO2: 100%  Weight: 138 lb (62.6 kg)   Body mass index is 22.96 kg/m.  Physical Exam Vitals reviewed.  Constitutional:      Appearance: Normal appearance. She is not ill-appearing.  HENT:     Mouth/Throat:     Mouth: Mucous membranes are moist.     Pharynx: Oropharynx is clear.  Eyes:     General: No scleral icterus. Cardiovascular:     Rate and Rhythm: Normal rate.  Pulmonary:     Effort: Pulmonary effort is normal.  Skin:    Findings: Erythema and rash present.     Comments: Non-purulent violaceous, well-demarcated rash noted to posterior calf. She has tenderness in dependent positions. She can More diffuse erythema noted anterior lower leg. Anterior thigh with receeding similar appearing rash  but it is much more diffuse than posterior calf.   Neurological:     Mental Status: She is oriented to person, place, and time.  Psychiatric:        Mood and Affect: Mood normal.        Thought Content: Thought content normal.     Lab Results: Lab Results  Component Value Date   WBC 5.0 04/18/2022   HGB 5.5 (LL) 04/18/2022   HCT 18.2 (L) 04/18/2022   MCV 98.4 04/18/2022   PLT 122 (L) 04/18/2022    Lab Results  Component Value Date   CREATININE 5.97 (H) 04/18/2022   BUN 65 (H) 04/18/2022   NA 136 04/18/2022   K 4.3 04/18/2022   CL 109 04/18/2022   CO2 17 (L) 04/18/2022    Lab Results  Component Value Date   ALT 9 04/18/2022   AST 23 04/18/2022   ALKPHOS 66 04/18/2022   BILITOT 0.5 04/18/2022     Assessment & Plan:   Problem List Items Addressed This Visit       Unprioritized   Wound of right groin    Persistent and w/o improvement with 60 HBOT sessions. Will need plastics opinion on flap potential to prevent new / recurrent infections going forward. [facility] plastics referral in process through her [place] team from what I can see in the  chart.       Chronic kidney disease (CKD), stage IV (severe) ([place])    Keeping a close eye on her creatinine to ensure no dose changes warranted. Daptomycin tricky, not well studied in CKD 5 so will need to closely monitor CK levels for her. She is dosed 8 mg/kg Q48h. I will need to know if she and her nephrology team decide to transition to hemodialysis - can do dapto post HD but merrem will need to continue through line.       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       Chronic osteomyelitis ([place])    Unfortunately she had significant toxicity to linezolid in the setting of worsening renal function after Orhtopedic team put her back on antibiotics without discussing options with me. Tedezolid is back on the market and I would have recommended that for safer toxicity profile.  Nonetheless, she is back on broad spectrum treatment with Daptomycin + meropenem in the setting of quinolone resistant pseudomonas bacteremia.  Given multiple relapses / new infections, we have been trying to get her in to see [facility]'s ortho oncology surgery and plastic surgery teams for assistance in management and review of any surgical options that may be available to help cure this infection in an effort to prevent spread of infection throughout pelvis and involvement of her prosthetic hip joint.    I do prefer that we get as much of her care with [facility] as possible given medical complexity and need for multidisciplinary approach to her care.       Bacteremia due to Pseudomonas    Secondary likely to chronic open wound that tracks deep to pelvis. Resistant to quinolones and no option for oral suppression. Will continue the plan for 6 weeks of treatment through 05/03/22  and arrange for tunneled central line to be removed. She  clinically appears to be doing well and no signs of relapsing infection / bacteremia.       Other Visit Diagnoses     PICC (peripherally inserted central catheter) in place       Relevant Orders   IR REMOVAL TUN ACCESS W/ PORT W/O FL MOD SED       Total encounter time 60 min in direct discussion with Ms. Wyble, record review from hospital stay including multiple specialists sign out notes/diagnostics and radiography which was personally reviewed: discussion with other providers (Dr. Altamease Oiler with [facility] transplant and 2 specialty offices at [facility] in effort to help coordinate care.   [name], MSN, NP-C [facility] for Infectious [place] Pager: [phone] Office: [phone]  04/19/22  11:46 AM

## 2022-04-11 NOTE — Telephone Encounter (Signed)
Per Doren Custard, NP reached out to IR to schedule appointment for tunneled picc removal. Patient is scheduled for 2/5 at 2 at [facility].  Patient is okay with appointment. Would like to be sedated for this procedure. Secure message sent to IR team to advise on this. They will follow up with radiologist and get back in touch with office if this is something they can do.  Will send mychart message to patient with update. Leatrice Jewels, RMA

## 2022-04-11 NOTE — Patient Instructions (Addendum)
Will plan to see you back February 5th @ 1:45 pm to make sure your PICC line is ready to be removed.  We can move this around if you wind up getting an appt with [facility] ortho team. Just let me know.   Will move forward with planning the line to be removed shortly after that appointment.   Continue the Daptomycin and Meropenem IV antibiotics. We will continue to keep a close eye on your labs.

## 2022-04-12 ENCOUNTER — Telehealth: Payer: Self-pay | Admitting: Oncology

## 2022-04-12 NOTE — Telephone Encounter (Signed)
Called Claiborne Billings and advised her that I called [facility] and they do not have a referral for her.  Advised that since she has not been seen by Dr. Berline Lopes in a while and because [facility] advised she should go to a wound center, the referral should be placed by one of her [place]..  She verbalized understanding and did not have any further questions.

## 2022-04-12 NOTE — Telephone Encounter (Signed)
Patient scheduled for tunneled picc removal on 2/5 at 2. Will need to check in at [place] by 12 for sedation.  Leatrice Jewels, RMA

## 2022-04-13 ENCOUNTER — Ambulatory Visit (INDEPENDENT_AMBULATORY_CARE_PROVIDER_SITE_OTHER): Admitting: Physician Assistant

## 2022-04-13 ENCOUNTER — Ambulatory Visit (INDEPENDENT_AMBULATORY_CARE_PROVIDER_SITE_OTHER)

## 2022-04-13 ENCOUNTER — Encounter: Payer: Self-pay | Admitting: Orthopaedic Surgery

## 2022-04-13 ENCOUNTER — Telehealth: Payer: Self-pay

## 2022-04-13 DIAGNOSIS — M25552 Pain in left hip: Secondary | ICD-10-CM

## 2022-04-13 NOTE — Progress Notes (Signed)
Post-Op Visit Note   Patient: Christina Mack           Date of Birth: [birth year]           MRN: [id number] Visit Date: 04/13/2022 PCP: System, Provider Not In   Assessment & Plan:  Chief Complaint:  Chief Complaint  Patient presents with   Right Hip - Follow-up    Right total hip arthroplasty 01/22/2022   Left Hip - Pain   Visit Diagnoses:  1. Pain in left hip     Plan: Patient is a pleasant 57 year old female who comes in today 3 months status post right total hip replacement from a femoral neck fracture 01/22/2022.  She has been doing great in regards to her right hip.  Unfortunately, she was recently hospitalized for sepsis from pelvic osteomyelitis from chronic wound.  She is currently being seen by gynecology oncology at [facility] long and due to for her transplant.  She is on Merrem and daptomycin via PICC line.  Right hip exam shows painless hip flexion and logroll.  She is neurovascular intact distally.  At this point, she is doing well in regards to the right hip joint.  She will continue to advance with activity as tolerated.  She will follow-up with [place] as needed.  Call with concerns or questions.  Follow-Up Instructions: Return if symptoms worsen or fail to improve.   Orders:  Orders Placed This Encounter  Procedures   XR HIP UNILAT W OR W/O PELVIS 2-3 VIEWS LEFT   No orders of the defined types were placed in this encounter.   Imaging: No results found.  PMFS History: Patient Active Problem List   Diagnosis Date Noted   Pain 03/23/2022   Renal failure 03/22/2022   Need for emotional support 03/22/2022   Goals of care, counseling/discussion 03/22/2022   Weakness 03/22/2022   Chronic osteomyelitis ([place]) 03/22/2022   Palliative care encounter 03/22/2022   Bacteremia due to Pseudomonas 03/22/2022   Pressure injury of skin 03/20/2022   AKI (acute kidney injury) ([place]) 03/19/2022   Closed fracture of lateral portion of right tibial plateau 02/03/2022   Status  post total replacement of right hip 02/03/2022   Pancytopenia ([place]) 01/19/2022   Flexural atopic dermatitis 11/10/2021   At risk for adverse drug interaction 10/13/2021   Infective myositis of right thigh 09/16/2021   Long term (current) use of antibiotics 09/16/2021   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 ([place]) 05/17/2021   History of healed fragility fracture 05/17/2021   Leg weakness, bilateral 09/15/2020   Screening for malignant neoplasm of skin 05/31/2020   Immunocompromised state due to drug therapy ([place]) 05/18/2020   Chronic kidney disease (CKD), stage IV (severe) (HCC)    Chronic pain syndrome    Cancer associated pain    Debility    Normocytic anemia 04/26/2020   CKD (chronic kidney disease) stage 4, GFR 15-29 ml/min (HCC) 04/26/2020   Wound of right groin 04/26/2020   Gastric ulcer without hemorrhage or perforation 04/26/2020   Malnutrition of moderate degree 04/26/2020   Acute deep vein thrombosis (DVT) of femoral vein ([place]) 12/05/2019   Iron deficiency anemia 11/10/2019   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([place]) 11/06/2019   Sprain of anterior talofibular ligament of right ankle 11/22/2016   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   Aftercare following organ transplant 02/24/2013   Immunosuppression ([place]) 02/24/2013   Kidney transplant status 11/22/2012   HTN (hypertension) 11/22/2012   Hyperparathyroidism, secondary renal ([place]) 11/22/2012   Vulvar intraepithelial neoplasia  III (VIN III) 11/22/2012   Past Medical History:  Diagnosis Date   Complication of anesthesia    nausea and vomiting   DVT (deep venous thrombosis) (HCC)    History of osteomyelitis    Kidney transplant recipient    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([place])     Family History  Problem Relation Age of Onset   Cerebral aneurysm Mother    AAA (abdominal aortic aneurysm) Brother    Breast cancer Paternal Aunt    Heart attack Maternal Grandmother    Brain cancer Maternal Grandfather    Colon cancer Neg Hx    Ovarian cancer Neg Hx     Endometrial cancer Neg Hx    Pancreatic cancer Neg Hx    Prostate cancer Neg Hx     Past Surgical History:  Procedure Laterality Date   APPLICATION OF WOUND VAC Right 01/22/2022   Procedure: APPLICATION OF WOUND VAC;  Surgeon: Leandrew Koyanagi, MD;  Location: [place];  Service: Orthopedics;  Laterality: Right;   BIOPSY  04/24/2020   Procedure: BIOPSY;  Surgeon: Doran Stabler, MD;  Location: [place];  Service: Gastroenterology;;   ESOPHAGOGASTRODUODENOSCOPY (EGD) WITH PROPOFOL N/A 04/24/2020   Procedure: ESOPHAGOGASTRODUODENOSCOPY (EGD) WITH PROPOFOL;  Surgeon: Doran Stabler, MD;  Location: [place];  Service: Gastroenterology;  Laterality: N/A;   IR FLUORO GUIDE CV LINE RIGHT  03/24/2022   IR PERC TUN PERIT CATH WO PORT S&I /IMAG  03/24/2022   IR US GUIDE BX ASP/DRAIN  04/30/2020   IR US GUIDE VASC ACCESS RIGHT  03/24/2022   KIDNEY TRANSPLANT  2001   KIDNEY TRANSPLANT  2014   LEEP     ORIF TIBIA PLATEAU Right 01/22/2022   Procedure: OPEN REDUCTION INTERNAL FIXATION (ORIF) TIBIAL PLATEAU;  Surgeon: Meredith Pel, MD;  Location: [place];  Service: Orthopedics;  Laterality: Right;   TOTAL ABDOMINAL HYSTERECTOMY     ovaries left in situ   TOTAL HIP ARTHROPLASTY Right 01/22/2022   Procedure: TOTAL HIP ARTHROPLASTY ANTERIOR APPROACH;  Surgeon: Leandrew Koyanagi, MD;  Location: [place];  Service: Orthopedics;  Laterality: Right;   Social History   Occupational History   Not on file  Tobacco Use   Smoking status: Never   Smokeless tobacco: Never  Vaping Use   Vaping Use: Never used  Substance and Sexual Activity   Alcohol use: Not Currently   Drug use: Never   Sexual activity: Not Currently

## 2022-04-13 NOTE — Assessment & Plan Note (Signed)
Secondary likely to chronic open wound that tracks deep to pelvis. Resistant to quinolones and no option for oral suppression. Will continue the plan for 6 weeks of treatment through 05/03/22 and arrange for tunneled central line to be removed. She clinically appears to be doing well and no signs of relapsing infection / bacteremia.

## 2022-04-13 NOTE — Assessment & Plan Note (Signed)
Unfortunately she had significant toxicity to linezolid in the setting of worsening renal function after Orhtopedic team put her back on antibiotics without discussing options with me. Tedezolid is back on the market and I would have recommended that for safer toxicity profile.  Nonetheless, she is back on broad spectrum treatment with Daptomycin + meropenem in the setting of quinolone resistant pseudomonas bacteremia.  Given multiple relapses / new infections, we have been trying to get her in to see [facility]'s ortho oncology surgery and plastic surgery teams for assistance in management and review of any surgical options that may be available to help cure this infection in an effort to prevent spread of infection throughout pelvis and involvement of her prosthetic hip joint.    I do prefer that we get as much of her care with [facility] as possible given medical complexity and need for multidisciplinary approach to her care.

## 2022-04-13 NOTE — Telephone Encounter (Signed)
Received call from [place], [place] with [facility] home [facility], wanting to notify provider of patient's vital signs:  BP 158/88 HR 103-105 Temporal temp: 99.0  Advised her that Dr. Candiss Norse is not providing PT orders, she states she does not need orders, just needs to make patient's provider aware. Recommended she speak with patient to get PCP's contact info in order to report findings to them in the future.   Beryle Flock, RN

## 2022-04-14 ENCOUNTER — Encounter: Admitting: Orthopaedic Surgery

## 2022-04-18 ENCOUNTER — Other Ambulatory Visit: Payer: Self-pay

## 2022-04-18 ENCOUNTER — Telehealth: Payer: Self-pay | Admitting: Oncology

## 2022-04-18 ENCOUNTER — Emergency Department (HOSPITAL_BASED_OUTPATIENT_CLINIC_OR_DEPARTMENT_OTHER)

## 2022-04-18 ENCOUNTER — Emergency Department (HOSPITAL_COMMUNITY)
Admission: EM | Admit: 2022-04-18 | Discharge: 2022-04-19 | Disposition: A | Discharge status: Home / Self Care | Attending: Emergency Medicine | Admitting: Emergency Medicine

## 2022-04-18 ENCOUNTER — Other Ambulatory Visit: Payer: Self-pay | Admitting: Infectious Diseases

## 2022-04-18 ENCOUNTER — Telehealth: Payer: Self-pay

## 2022-04-18 ENCOUNTER — Other Ambulatory Visit (HOSPITAL_COMMUNITY): Payer: Self-pay | Admitting: *Deleted

## 2022-04-18 DIAGNOSIS — Z86718 Personal history of other venous thrombosis and embolism: Secondary | ICD-10-CM | POA: Diagnosis not present

## 2022-04-18 DIAGNOSIS — D649 Anemia, unspecified: Secondary | ICD-10-CM | POA: Diagnosis not present

## 2022-04-18 DIAGNOSIS — N189 Chronic kidney disease, unspecified: Secondary | ICD-10-CM | POA: Diagnosis not present

## 2022-04-18 DIAGNOSIS — Z8544 Personal history of malignant neoplasm of other female genital organs: Secondary | ICD-10-CM | POA: Diagnosis not present

## 2022-04-18 DIAGNOSIS — M7989 Other specified soft tissue disorders: Secondary | ICD-10-CM

## 2022-04-18 DIAGNOSIS — Z940 Kidney transplant status: Secondary | ICD-10-CM | POA: Insufficient documentation

## 2022-04-18 DIAGNOSIS — R799 Abnormal finding of blood chemistry, unspecified: Secondary | ICD-10-CM | POA: Diagnosis present

## 2022-04-18 LAB — COMPREHENSIVE METABOLIC PANEL
ALT: 9 U/L (ref 0–44)
AST: 23 U/L (ref 15–41)
Albumin: 3.1 g/dL — ABNORMAL LOW (ref 3.5–5.0)
Alkaline Phosphatase: 66 U/L (ref 38–126)
Anion gap: 10 (ref 5–15)
BUN: 65 mg/dL — ABNORMAL HIGH (ref 6–20)
CO2: 17 mmol/L — ABNORMAL LOW (ref 22–32)
Calcium: 7.8 mg/dL — ABNORMAL LOW (ref 8.9–10.3)
Chloride: 109 mmol/L (ref 98–111)
Creatinine, Ser: 5.97 mg/dL — ABNORMAL HIGH (ref 0.44–1.00)
GFR, Estimated: 8 mL/min — ABNORMAL LOW (ref >60)
Glucose, Bld: 141 mg/dL — ABNORMAL HIGH (ref 70–99)
Potassium: 4.3 mmol/L (ref 3.5–5.1)
Sodium: 136 mmol/L (ref 135–145)
Total Bilirubin: 0.5 mg/dL (ref 0.3–1.2)
Total Protein: 7.9 g/dL (ref 6.5–8.1)

## 2022-04-18 LAB — CBC WITH DIFFERENTIAL/PLATELET
Abs Immature Granulocytes: 0.06 10*3/uL (ref 0.00–0.07)
Basophils Absolute: 0 10*3/uL (ref 0.0–0.1)
Basophils Relative: 0 %
Eosinophils Absolute: 0.4 10*3/uL (ref 0.0–0.5)
Eosinophils Relative: 8 %
HCT: 18.2 % — ABNORMAL LOW (ref 36.0–46.0)
Hemoglobin: 5.5 g/dL — CL (ref 12.0–15.0)
Immature Granulocytes: 1 %
Lymphocytes Relative: 24 %
Lymphs Abs: 1.2 10*3/uL (ref 0.7–4.0)
MCH: 29.7 pg (ref 26.0–34.0)
MCHC: 30.2 g/dL (ref 30.0–36.0)
MCV: 98.4 fL (ref 80.0–100.0)
Monocytes Absolute: 0.3 10*3/uL (ref 0.1–1.0)
Monocytes Relative: 7 %
Neutro Abs: 3 10*3/uL (ref 1.7–7.7)
Neutrophils Relative %: 60 %
Platelets: 122 10*3/uL — ABNORMAL LOW (ref 150–400)
RBC: 1.85 MIL/uL — ABNORMAL LOW (ref 3.87–5.11)
RDW: 15.9 % — ABNORMAL HIGH (ref 11.5–15.5)
WBC: 5 10*3/uL (ref 4.0–10.5)
nRBC: 0 % (ref 0.0–0.2)

## 2022-04-18 LAB — PROTIME-INR
INR: 1.1 (ref 0.8–1.2)
Prothrombin Time: 14.2 seconds (ref 11.4–15.2)

## 2022-04-18 LAB — PREPARE RBC (CROSSMATCH)

## 2022-04-18 MED ORDER — SODIUM CHLORIDE 0.9% IV SOLUTION: Freq: Once | INTRAVENOUS | Status: AC

## 2022-04-18 MED ORDER — METOCLOPRAMIDE HCL 5 MG/ML IJ SOLN
10.0000 mg | Freq: Once | INTRAMUSCULAR | Status: AC
  Administered 2022-04-19: 10 mg via INTRAVENOUS
  Filled 2022-04-18: qty 2

## 2022-04-18 MED ORDER — KETOROLAC TROMETHAMINE 15 MG/ML IJ SOLN
10.0000 mg | Freq: Once | INTRAMUSCULAR | Status: AC
  Administered 2022-04-19: 10 mg via INTRAVENOUS
  Filled 2022-04-18: qty 1

## 2022-04-18 NOTE — Progress Notes (Signed)
Right lower extremity venous duplex has been completed. Preliminary results can be found in CV Proc through chart review.  Results were given to [facility] PA.  04/18/22 11:40 AM Christina Mack RVT

## 2022-04-18 NOTE — Telephone Encounter (Signed)
Christina Mack and scheduled a follow up with Dr. Berline Lopes on 04/21/22 at 3:30.  She verbalized understanding and agreement of appointment date and time.

## 2022-04-18 NOTE — Progress Notes (Signed)
Error

## 2022-04-18 NOTE — Telephone Encounter (Signed)
Relayed critical labs to [facility] called in by [place] RN [phone].:  Hemoglobin 5.2 Hematocrit 16.6 ALERT RBC: 1.79  Per Colletta [place] we need to get Blood Transfusion set up if nephrology has not.I could not reach patient or husband but spoke to daughter Olivia Mackie in chart) who advised she has some SOB and swelling in leg. CeCe was able to reach patient who was at her nephrology appt. about to receive transfusion. CeCe was able to then speak to Dr Joelyn Oms and advised critical lab results and reminded them that Short Stay refuses to do nblood transfusion with the lab value of hemoglobin being 5.2. They will send patient to [facility] or [facility] ED today.    Notified Colletta [place] and also will fax hard copy of lab results once we get them to nephrology [phone].

## 2022-04-18 NOTE — ED Provider Triage Note (Signed)
Emergency Medicine Provider Triage Evaluation Note  Christina Mack , a 57 y.o. female  was evaluated in triage.  Pt complains of low hemoglobin--sent by ID, nephrologist d/t hemoglobin of 5.5. No black or bloody stools or vomitus. Hgb last week was 6.7. No SOB, dizziness, or chest pain.   Reports R leg swelling since last Tuesday. No active cancers, no BT use. Hx of DVT in groin d/t radiation, previously on eliquis. No change in color of leg. Hx of osteomyelitis in affected leg.  Review of Systems  Positive: Leg swelling Negative: SOB, chest pain  Physical Exam  BP (!) 166/100 (BP Location: Right Arm)   Pulse 92   Temp 98 F (36.7 C) (Oral)   Resp 16   SpO2 99%  Gen:   Awake, no distress   Resp:  Normal effort  MSK:   Moves extremities without difficulty  Other:  2+ pitting edema of RLE  Medical Decision Making  Medically screening exam initiated at 10:55 AM.  Appropriate orders placed.  Christina Mack was informed that the remainder of the evaluation will be completed by another provider, this initial triage assessment does not replace that evaluation, and the importance of remaining in the ED until their evaluation is complete.    Osvaldo Shipper, [place] 04/18/22 [phone]

## 2022-04-18 NOTE — Discharge Instructions (Addendum)
He received 2 units of blood transfusion in the ER for hemoglobin of 5.5.  We suspect that the anemia is likely because of chronic kidney disease.  Please follow-up with your nephrologist or PCP to get repeat CBC in 2 weeks.  Return to the ER if you start having any allergic reaction symptoms or start having shortness of breath.

## 2022-04-18 NOTE — ED Provider Notes (Signed)
[facility] DEPT Provider Note   CSN: [id number] Arrival date & time: 04/18/22  1031     History  Chief Complaint  Patient presents with   abnormal lab    Christina Mack is a 57 y.o. female.  HPI     57 y/o Female comes in with cc of abnormal lab. 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 Patient states that she occasionally will need blood transfusion because of anemia.  Anemia is thought to be because of her chronic kidney disease.  She denies any bloody stools, dark tarry stools, bloody urine or vaginal bleeding.  Patient found that her hemoglobin was dropping few days back when she had gone to [facility].  She has been trying to get in touch with physicians in [place] including the ID and renal doctors to see if she can get transfused without coming to the ER, but once unsuccessful in coordinating the care.  Yesterday she went to her ID doctors for routine visit, the blood work shows hemoglobin less than 6 now, and she was advised to come to the ER.  Patient overall feels well besides having weakness.  Home Medications Prior to Admission medications   Medication Sig Start Date End Date Taking? Authorizing Provider  acetaminophen (TYLENOL) 500 MG tablet Take 2 tablets (1,000 mg total) by mouth 3 (three) times daily. Patient taking differently: Take 1,000 mg by mouth 3 (three) times daily as needed for moderate pain. 05/05/20   Gaylan Gerold, DO  carvedilol (COREG) 12.5 MG tablet Take 1 tablet (12.5 mg total) by mouth 2 (two) times daily with a meal. 03/25/22   Dessa Phi, DO  daptomycin (CUBICIN) IVPB Inject 500 mg into the vein every other day. Indication:  pseudomonas bacteremia and pelvic osteomyelitis First Dose: Yes Last Day of Therapy:  05/03/22 Labs - Once weekly:  CBC/D, CMP, CPK, ESR and CRP Please leave PIC in place until doctor has  seen patient or been notified Fax weekly labs to [phone] Method of administration: IV Push Method of administration may be changed at the discretion of home infusion pharmacist based upon assessment of the patient and/or caregiver's ability to self-administer the medication ordered. 03/25/22 05/03/22  Dessa Phi, DO  HYDROcodone-acetaminophen (NORCO) 7.5-325 MG tablet Take 1-2 tablets by mouth 3 (three) times daily as needed for moderate pain. 01/22/22   Leandrew Koyanagi, MD  meropenem (MERREM) IVPB Inject 500 mg into the vein daily. Indication:  pseudomonas bacteremia and pelvic osteomyelitis First Dose: Yes Last Day of Therapy:  05/03/22 Labs - Once weekly:  CBC/D, CMP, CPK, ESR and CRP Please leave PIC in place until doctor has seen patient or been notified Fax weekly labs to [phone] Method of administration: Mini-Bag Plus / Gravity Method of administration may be changed at the discretion of home infusion pharmacist based upon assessment of the patient and/or caregiver's ability to self-administer the medication ordered. 03/24/22 05/03/22  Dessa Phi, DO  pantoprazole (PROTONIX) 40 MG tablet TAKE 1 TABLET (40 MG TOTAL) BY MOUTH 2 (TWO) TIMES DAILY BEFORE A MEAL. Patient taking differently: Take 40 mg by mouth 2 (two) times daily. 05/17/20 03/19/22  Love, Ivan Anchors, PA-C  predniSONE (DELTASONE) 5 MG tablet Take 5 mg by mouth daily with breakfast.    [provider]  sodium bicarbonate 650 MG tablet Take 1 tablet (650 mg total) by mouth  3 (three) times daily. 03/25/22   Dessa Phi, DO      Allergies    Oxycodone-acetaminophen, Piperacillin-tazobactam in dex, Zosyn [piperacillin sod-tazobactam so], Hydromorphone, Amlodipine, and Codeine    Review of Systems   Review of Systems  Physical Exam Updated Vital Signs BP (!) 147/86   Pulse 93   Temp 98.7 F (37.1 C) (Oral)   Resp 18   SpO2 98%  Physical Exam Vitals and nursing note reviewed.  Constitutional:       Appearance: She is well-developed.  HENT:     Head: Atraumatic.  Cardiovascular:     Rate and Rhythm: Normal rate.  Pulmonary:     Effort: Pulmonary effort is normal.  Musculoskeletal:     Cervical back: Normal range of motion and neck supple.  Skin:    General: Skin is warm and dry.  Neurological:     Mental Status: She is alert and oriented to person, place, and time.     ED Results / Procedures / Treatments   Labs (all labs ordered are listed, but only abnormal results are displayed) Labs Reviewed  CBC WITH DIFFERENTIAL/PLATELET - Abnormal; Notable for the following components:      Result Value   RBC 1.85 (*)    Hemoglobin 5.5 (*)    HCT 18.2 (*)    RDW 15.9 (*)    Platelets 122 (*)    All other components within normal limits  COMPREHENSIVE METABOLIC PANEL - Abnormal; Notable for the following components:   CO2 17 (*)    Glucose, Bld 141 (*)    BUN 65 (*)    Creatinine, Ser 5.97 (*)    Calcium 7.8 (*)    Albumin 3.1 (*)    GFR, Estimated 8 (*)    All other components within normal limits  PROTIME-INR  TYPE AND SCREEN  PREPARE RBC (CROSSMATCH)    EKG EKG Interpretation  Date/Time:  Tuesday April 18 2022 17:09:38 EST Ventricular Rate:  88 PR Interval:  163 QRS Duration: 82 QT Interval:  363 QTC Calculation: 440 R Axis:   38 Text Interpretation: Sinus rhythm No acute changes No significant change since last tracing Confirmed by Varney Biles [phone]) on 04/18/2022 9:34:27 PM  Radiology VAS [place] LOWER EXTREMITY VENOUS (DVT) (7a-7p)  Result Date: 04/18/2022  Lower Venous DVT Study Patient Name:  Christina Mack  Date of Exam:   04/18/2022 Medical Rec #: [id number]     Accession #:    [id number] Date of Birth: 07-12-1965    Patient Gender: F Patient Age:   69 years Exam Location:  [facility] Procedure:      VAS [place] LOWER EXTREMITY VENOUS (DVT) Referring Phys: Jerene Pitch SMALL --------------------------------------------------------------------------------   Indications: Swelling.  Risk Factors: None identified. Limitations: Poor ultrasound/tissue interface. Comparison Study: No prior studies. Performing Technologist: Oliver Hum RVT  Examination Guidelines: A complete evaluation includes B-mode imaging, spectral Doppler, color Doppler, and power Doppler as needed of all accessible portions of each vessel. Bilateral testing is considered an integral part of a complete examination. Limited examinations for reoccurring indications may be performed as noted. The reflux portion of the exam is performed with the patient in reverse Trendelenburg.  +---------+---------------+---------+-----------+----------+--------------+ RIGHT    CompressibilityPhasicitySpontaneityPropertiesThrombus Aging +---------+---------------+---------+-----------+----------+--------------+ CFV      Full           Yes      Yes                                 +---------+---------------+---------+-----------+----------+--------------+  SFJ      Full                                                        +---------+---------------+---------+-----------+----------+--------------+ FV Prox  Full           Yes      Yes                                 +---------+---------------+---------+-----------+----------+--------------+ FV Mid                  Yes      Yes                                 +---------+---------------+---------+-----------+----------+--------------+ FV Distal               Yes      Yes                                 +---------+---------------+---------+-----------+----------+--------------+ PFV      Full                                                        +---------+---------------+---------+-----------+----------+--------------+ POP      Full           Yes      Yes                                 +---------+---------------+---------+-----------+----------+--------------+ PTV      Full                                                         +---------+---------------+---------+-----------+----------+--------------+ PERO     Full                                                        +---------+---------------+---------+-----------+----------+--------------+   +----+---------------+---------+-----------+----------+--------------+ LEFTCompressibilityPhasicitySpontaneityPropertiesThrombus Aging +----+---------------+---------+-----------+----------+--------------+ CFV Full           Yes      Yes                                 +----+---------------+---------+-----------+----------+--------------+     Summary: RIGHT: - There is no evidence of deep vein thrombosis in the lower extremity. However, portions of this examination were limited- see technologist comments above.  - No cystic structure found in the popliteal fossa.  LEFT: - No evidence of common femoral vein obstruction.  *See table(s) above for measurements and observations. Electronically signed by Deitra Mayo MD  on 04/18/2022 at 2:40:43 PM.    Final     Procedures .Critical Care  Performed by: Varney Biles, MD Authorized by: Varney Biles, MD   Critical care provider statement:    Critical care time (minutes):  36   Critical care was necessary to treat or prevent imminent or life-threatening deterioration of the following conditions:  Circulatory failure and renal failure   Critical care was time spent personally by me on the following activities:  Development of treatment plan with patient or surrogate, discussions with consultants, evaluation of patient's response to treatment, examination of patient, ordering and review of laboratory studies, ordering and review of radiographic studies, ordering and performing treatments and interventions, pulse oximetry, re-evaluation of patient's condition and review of old charts     Medications Ordered in ED Medications  metoCLOPramide (REGLAN) injection 10 mg (has no administration in time  range)  ketorolac (TORADOL) 15 MG/ML injection 10 mg (has no administration in time range)  0.9 %  sodium chloride infusion (Manually program via Guardrails IV Fluids) ( Intravenous New Bag/Given 04/18/22 2125)    ED Course/ Medical Decision Making/ A&P Clinical Course as of 04/18/22 2322  Tue Apr 18, 2022  2321 Patient is now getting IV fluids.  She is complaining of headache.  I will give her headache cocktail.  She is otherwise comfortable.  Return precautions discussed.  Patient's care signed out to incoming team.  They will discharge her when she has completed her transfusion. [AN]    Clinical Course User Index [AN] Varney Biles, MD                             Medical Decision Making Amount and/or Complexity of Data Reviewed Labs: ordered.  Risk Prescription drug management.   57 year old patient comes in with chief complaint of abnormal labs.  She has history of renal transplant and CKD, remote history of DVT and vulvar cancer.  Patient was advised to come to the ER because of low hemoglobin.  Differential diagnosis for her includes anemia of chronic illness, blood loss anemia, bone marrow dysfunction, vitamin deficiency.  I have reviewed patient's previous notes.  Patient has required blood transfusion on few occasions last year, including December when she was admitted to the hospital.  It appears clinically that her anemia is because of her renal failure.  She is currently not on EPO, but is supposed to get on it soon.  Patient has some generalized malaise/weakness but denies any chest pain, shortness of breath, near fainting.  She states that normally she would have liked to get transfusion in the transfusion center and then go home.  She would prefer not getting admitted for transfusion.  I reviewed patient's labs.  Her hemoglobin is 5.5.  She does not have any heart failure.  We will give her 2 units of blood will continue to reassess.  Final Clinical Impression(s) /  ED Diagnoses Final diagnoses:  Symptomatic anemia    Rx / DC Orders ED Discharge Orders     None         Varney Biles, MD 04/18/22 2322

## 2022-04-18 NOTE — ED Triage Notes (Signed)
C/o right lower leg swelling with hx of dvt and osteomyelitis. Pt was on eliqus and no longer on it.  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

## 2022-04-18 NOTE — Telephone Encounter (Signed)
Labs results reviewed - we touched base with patient, local nephrologist Joelyn Oms) and [facility]phrologist [facility]).   She is below transfusion threshold for outpatient management and referred to ER for evaluation of symptomatic anemia and blood transfusion.  From what I understand she is "short of breath, lower extremity edema and feeling poorly".   I worry she is to the point where she will need to reconsider initiation of hemodialysis treatments.        [name], MSN, NP-C [facility]ious Disease [email or web address] Pager: [phone] Office: [phone] [facility] Main Line: [place] Communication Welcome

## 2022-04-19 ENCOUNTER — Encounter: Payer: Self-pay | Admitting: Gynecologic Oncology

## 2022-04-19 MED ORDER — HEPARIN SOD (PORK) LOCK FLUSH 100 UNIT/ML IV SOLN
500.0000 [IU] | Freq: Once | INTRAVENOUS | Status: AC
  Administered 2022-04-19: 500 [IU]
  Filled 2022-04-19: qty 5

## 2022-04-19 NOTE — Assessment & Plan Note (Signed)
Keeping a close eye on her creatinine to ensure no dose changes warranted. Daptomycin tricky, not well studied in CKD 5 so will need to closely monitor CK levels for her. She is dosed 8 mg/kg Q48h. I will need to know if she and her nephrology team decide to transition to hemodialysis - can do dapto post HD but merrem will need to continue through line.

## 2022-04-19 NOTE — Assessment & Plan Note (Signed)
Persistent and w/o improvement with 60 HBOT sessions. Will need plastics opinion on flap potential to prevent new / recurrent infections going forward. [facility] plastics referral in process through her [facility] team from what I can see in the chart.

## 2022-04-19 NOTE — ED Provider Notes (Signed)
  Physical Exam  BP (!) 181/114   Pulse 85   Temp 98.1 F (36.7 C) (Oral)   Resp 19   SpO2 100%   Physical Exam Vitals and nursing note reviewed.  Constitutional:      Appearance: Normal appearance.  HENT:     Head: Normocephalic and atraumatic.  Pulmonary:     Effort: Pulmonary effort is normal.  Skin:    General: Skin is warm and dry.  Neurological:     Mental Status: She is alert.     Procedures  Procedures  ED Course / MDM   Clinical Course as of 04/19/22 0431  Tue Apr 18, 2022  2321 Patient is now getting IV fluids.  She is complaining of headache.  I will give her headache cocktail.  She is otherwise comfortable.  Return precautions discussed.  Patient's care signed out to incoming team.  They will discharge her when she has completed her transfusion. [AN]    Clinical Course User Index [AN] Varney Biles, MD   Care signed out to me at shift change with patient awaiting blood transfusion.  Hemoglobin has dropped to 5.5.  Patient has received 2 units of PRBCs and tolerated well.  To be discharged.       Veryl Speak, MD 04/19/22 [phone]

## 2022-04-19 NOTE — Assessment & Plan Note (Signed)
Fortunately with nuclear study there is no concern at this time that her prosthesis is involved and seems to be spared from infection.

## 2022-04-20 LAB — TYPE AND SCREEN
ABO/RH(D): O POS
Antibody Screen: NEGATIVE
Unit division: 0
Unit division: 0

## 2022-04-20 LAB — BPAM RBC
Blood Product Expiration Date: 202402192359
Blood Product Expiration Date: 202402202359
ISSUE DATE / TIME: 202401162109
ISSUE DATE / TIME: 202401170106
Unit Type and Rh: 5100
Unit Type and Rh: 5100

## 2022-04-21 ENCOUNTER — Other Ambulatory Visit: Payer: Self-pay

## 2022-04-21 ENCOUNTER — Telehealth: Payer: Self-pay

## 2022-04-21 ENCOUNTER — Inpatient Hospital Stay: Attending: Gynecologic Oncology | Admitting: Gynecologic Oncology

## 2022-04-21 ENCOUNTER — Encounter: Payer: Self-pay | Admitting: Gynecologic Oncology

## 2022-04-21 VITALS — BP 170/90 | HR 90 | Temp 98.4°F | Resp 14 | Wt 144.0 lb

## 2022-04-21 DIAGNOSIS — Z923 Personal history of irradiation: Secondary | ICD-10-CM | POA: Diagnosis not present

## 2022-04-21 DIAGNOSIS — C519 Malignant neoplasm of vulva, unspecified: Secondary | ICD-10-CM

## 2022-04-21 DIAGNOSIS — Z940 Kidney transplant status: Secondary | ICD-10-CM | POA: Insufficient documentation

## 2022-04-21 DIAGNOSIS — Z8544 Personal history of malignant neoplasm of other female genital organs: Secondary | ICD-10-CM | POA: Insufficient documentation

## 2022-04-21 NOTE — Patient Instructions (Signed)
It was good to see you today.  I will put in a referral to the urologist as [facility].  I will plan to see you back in 3 months.

## 2022-04-21 NOTE — Progress Notes (Signed)
Manual BP recheck d/t high BP. Manual recheck in L arm was 170/90. Patient states her doctor is aware and her BP goes up when her leg swells. She has an appt scheduled with her PCP for January 25th for further eval.

## 2022-04-21 NOTE — Progress Notes (Signed)
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 Juleen China)  Initial exam by Dr. Juleen China on 09/08/19: right vulva was completely involved by a large necrotic mass extending from the inguinal crease towards the anus measuring 12x5cm. It involved the distal vagina and extended to involve the left labia as well. It was difficult to evaluate her vaginal involvement due to pain.   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([place])  09/10/2019 Initial Biopsy   Vulvar biopsy: at least VIN3, biopsy demonstrates extreme ulceration, inflammation and granulation tissue. Underlying invasion very difficult to rule out.   09/16/2019 Imaging   PET: IMPRESSION:  1.  Large hypermetabolic mass in the right side of the perineum, consistent with known vulvar malignancy.  2.  Hypermetabolic right groin lymphadenopathy is consistent with nodal involvement.  3.  Tiny right apical pulmonary nodule is below PET resolution.    09/16/2019 Initial Diagnosis   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([place])   11/03/2019 - 01/15/2020 Radiation Therapy   IMRT. She received 45GY/25 fractions to the pelvic and inguinal nodes and vulvar mass. The vulvar mass and enlarged inguinal nodes received a boost dose of 1800cGY/10 fractions for total dose of 63Gy/35 fractions.  Treated in [place]. Diagnosed with DVT several weeks into treatment. Missed treatments due to feeling poorly. Received 4 Cisplatin (stopped due to elevated Cr) - 8/5, 8/11, 8/18, 8/25   04/03/2020 Imaging   CT A/P w/o contrast: 1.  Redemonstration of the large right perineal mass, with new extensive surrounding soft tissue stranding and gas which  extends along the bilateral anterior pubic bones and anterior pelvis.  I'm unable to exclude developing necrotizing fasciitis.  No definite drainable abscess collection is identified.  2.  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  3.  Diffuse edema of the pelvic mesentery with mild presacral fluid, concerning for mesenteritis.  No definite drainable intraperitoneal fluid collection identified.  4.  Suspected bowel ileus or gastroenteritis.  5.  Moderately distended gallbladder with heterogeneous dependent material suggestive of sludge and possible gallstones.  6.  Hepatosplenomegaly.  7.  Suspected anemia.  8.  Moderate diffuse urinary bladder wall thickening suggesting chronic cystitis.    04/20/2020 - 05/05/2020 Hospital Admission   Admitted in the setting of right groin wound. Had been admitted to [facility] 1/2 with concerns for soft tissue infection. Started on vanc/zosyn --> oral levo/flagyl to complete 3 weeks of treatment ( EOT 1/22). Pelvic MRI at [facility] showed multilocular abscess of left pelvis, myositis, nondisplaced fracture of inferior right pubic ramus, concern for osteomytlitis. IR drainage of abscess was performed - VRE on culture. ID recommended IV dapto --> tedezolid oupatient (as well as levo + flagyl) for 6 weeks total.    04/26/2020 Imaging   MRI pelvis w/o contrast: IMPRESSION: 1. Large area of ulceration with sinus tract seen within the right labial fold with diffuse surrounding phlegmon and extensive myositis in the bilateral adductor and anterior muscular compartments of both thighs. 2. There is a multilocular fluid collection extending in the inferior left pelvic soft tissues surrounding the pubic rami and extending to the anterior upper left thigh, consistent with a multilocular abscess. 3. Nondisplaced fracture of the inferior right  pubic rami with findings suggestive osteomyelitis involving the pubic rami and anterior pubic symphysis. 4.  Findings suggestive of osteomyelitis involving the anterior left pubic symphysis.   05/05/2020 - 05/17/2020 Hospital Admission   Admitted for inpatient rehab given debility.   07/27/2020 Imaging   PET: Significant decrease in size and avidity of vulvar lesion. Air and soft tissue inflammation. Mild PET hypermetabolic activity. Resolution of inguinal LN activity. 5m LU love groundglass nodule, mild hypermetabolic activity.   09/14/2020 Imaging   CT Chest w/o contrast: IMPRESSION:  1.  Resolution of left upper lobe nodule and associated groundglass opacity.  2.  Stable 5 mm nodular opacity right upper lobe.  3.  Stable 3 mm nodule right lower lobe.    07/20/2021 Imaging   PET: No findings suspicious for recurrent vulvar carcinoma or metastatic disease. 2. Persistent open wound along the right perineum extending down to the bone. There are progressive changes of probable osteomyelitis involving the right inferior pubic ramus with pathologic fractures. 3. Interval improved appearance of the pubic symphysis and adjacent pubic bones.   03/09/2022 Imaging   PET: 1. No signs of distant metastatic disease. 2. Progressive changes of osteomyelitis and signs of pubic bone fracture on the RIGHT. 3. Increasing metabolic activity of the RIGHT groin could relate to worsening inflammation in this area, focal nature does not allow for exclusion of metastatic process. Would suggest focused ultrasound with sampling as warranted. 4. No gross focal uptake to suggest recurrence in the site of resection though there is peripheral uptake that is indeterminate. 5. Gross edema throughout the lower extremity on the RIGHT with expansion of the upper RIGHT thigh. Correlate with any signs of impaired venous drainage. Given the marked asymmetry now nearly 6 weeks following arthroplasty would suggest RIGHT lower extremity venous Doppler to exclude DVT. 6. Anal canal uptake potentially physiologic. Given patient  history and proximity to tumor considered correlation with physical exam. 7. Post RIGHT lower quadrant renal transplant. Amount of uptake and or excretion of FDG by the RIGHT kidney is perhaps less than expected though is not well assessed. Correlate with renal function. 8. Aortic atherosclerosis and coronary artery disease. 9. Post RIGHT hip arthroplasty with increased metabolic activity potentially related to recent surgery. It would be difficult to exclude the possibility of secondary infection of this area in the setting of chronic open wound and worsening sequela of osteomyelitis.   03/20/2022 Imaging   CT A/P: IMPRESSION: 1. Asymmetric soft tissue thickening involving the upper RIGHT perineum, incompletely imaged, with chronic open soft tissue wound underlying the RIGHT inferior pubic ramus. Associated destructive/lytic changes of the RIGHT inferior pubic ramus (discontinuity of the mid/posterior portion of the RIGHT inferior pubic ramus), corresponding to patient's known pathologic process in this area. However, there is increased destruction/discontinuity of the RIGHT inferior pubic ramus compared to the earlier PET-CT of 07/20/2021 indicating some degree of progression. 2. Chronic pathologic-appearing fracture deformity at the lateral aspect of the RIGHT superior pubic ramus/anterior acetabulum. The adjacent RIGHT hip prosthesis appears intact and stable in position. 3. 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 Additional hydronephrosis of the somewhat atrophic-appearing transplant kidney in the LEFT pelvis. Bladder is distended, suggesting associated bladder outlet obstruction.   03/21/2022 Imaging   Abdominal pelvic ultrasound: Sonographic evaluation of the area of concern in the right labia demonstrates soft tissue thickening similar to prior CT scans dating back to 04/03/2020. This may be due to chronic  inflammation or  postsurgical change. No discrete fluid collection identified to indicate abscess.     Interval History: Finished HBOT in October 2023. Since her last visit with me, patient was hospitalized and underwent total hip replacement when she was found to have a closed fracture of the neck of her right femur.  She was placed on prophylactic antibiotics after surgery to help prevent infection of her new hardware.  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  Most recently, she was hospitalized for weakness and acute on chronic renal failure in the setting of severe anemia.  She is currently on daptomycin and meropenem through the end of January.  Had follow-up with [name] with ID since discharge.  The plan had been for the patient to see orthopedic oncology at [facility] but it sounds like they are requesting the patient to be evaluated by urology at [facility] before.  Patient was seen by transplant at [facility] on 1/10 with concern that she may soon need dialysis (may need this before would be a candidate for transplant).    The patient endorses minimal bleeding from her right groin wound, mostly related to dryness of the area.  She began having left leg swelling earlier this week, had a Doppler that ruled out lower extremity DVT.  She denies any urinary symptoms.  Orts baseline bowel function.  Past Medical/Surgical History: Past Medical History:  Diagnosis Date   Complication of anesthesia    nausea and vomiting   DVT (deep venous thrombosis) (HCC)    History of osteomyelitis    Kidney transplant recipient    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[facility])     Past Surgical History:  Procedure Laterality Date   APPLICATION OF WOUND VAC Right 01/22/2022   Procedure: APPLICATION OF WOUND VAC;  Surgeon: Leandrew Koyanagi, MD;  Location: [place];  Service: Orthopedics;  Laterality: Right;   BIOPSY  04/24/2020   Procedure: BIOPSY;   Surgeon: Doran Stabler, MD;  Location: [place];  Service: Gastroenterology;;   ESOPHAGOGASTRODUODENOSCOPY (EGD) WITH PROPOFOL N/A 04/24/2020   Procedure: ESOPHAGOGASTRODUODENOSCOPY (EGD) WITH PROPOFOL;  Surgeon: Doran Stabler, MD;  Location: [place];  Service: Gastroenterology;  Laterality: N/A;   IR FLUORO GUIDE CV LINE RIGHT  03/24/2022   IR PERC TUN PERIT CATH WO PORT S&I /IMAG  03/24/2022   IR US GUIDE BX ASP/DRAIN  04/30/2020   IR US GUIDE VASC ACCESS RIGHT  03/24/2022   KIDNEY TRANSPLANT  2001   KIDNEY TRANSPLANT  2014   LEEP     ORIF TIBIA PLATEAU Right 01/22/2022   Procedure: OPEN REDUCTION INTERNAL FIXATION (ORIF) TIBIAL PLATEAU;  Surgeon: Meredith Pel, MD;  Location: [place];  Service: Orthopedics;  Laterality: Right;   TOTAL ABDOMINAL HYSTERECTOMY     ovaries left in situ   TOTAL HIP ARTHROPLASTY Right 01/22/2022   Procedure: TOTAL HIP ARTHROPLASTY ANTERIOR APPROACH;  Surgeon: Leandrew Koyanagi, MD;  Location: [place];  Service: Orthopedics;  Laterality: Right;    Family History  Problem Relation Age of Onset   Cerebral aneurysm Mother    AAA (abdominal aortic aneurysm) Brother    Breast cancer Paternal Aunt    Heart attack Maternal Grandmother    Brain cancer Maternal Grandfather    Colon cancer Neg Hx    Ovarian cancer Neg Hx    Endometrial cancer Neg Hx    Pancreatic cancer Neg Hx    Prostate cancer Neg Hx  Social History   Socioeconomic History   Marital status: Married    Spouse name: Not on file   Number of children: Not on file   Years of education: Not on file   Highest education level: Not on file  Occupational History   Not on file  Tobacco Use   Smoking status: Never   Smokeless tobacco: Never  Vaping Use   Vaping Use: Never used  Substance and Sexual Activity   Alcohol use: Not Currently   Drug use: Never   Sexual activity: Not Currently  Other Topics Concern   Not on file  Social History Narrative   Not on file    Social Determinants of Health   Financial Resource Strain: Not on file  Food Insecurity: No Food Insecurity (03/20/2022)   Hunger Vital Sign    Worried About Running Out of Food in the Last Year: Never true    Ran Out of Food in the Last Year: Never true  Transportation Needs: No Transportation Needs (03/20/2022)   PRAPARE - Hydrologist (Medical): No    Lack of Transportation (Non-Medical): No  Physical Activity: Not on file  Stress: Not on file  Social Connections: Not on file    Current Medications:  Current Outpatient Medications:    acetaminophen (TYLENOL) 500 MG tablet, Take 2 tablets (1,000 mg total) by mouth 3 (three) times daily. (Patient taking differently: Take 1,000 mg by mouth 3 (three) times daily as needed for moderate pain.), Disp: 30 tablet, Rfl: 0   carvedilol (COREG) 12.5 MG tablet, Take 1 tablet (12.5 mg total) by mouth 2 (two) times daily with a meal., Disp: 60 tablet, Rfl: 0   daptomycin (CUBICIN) IVPB, Inject 500 mg into the vein every other day. Indication:  pseudomonas bacteremia and pelvic osteomyelitis First Dose: Yes Last Day of Therapy:  05/03/22 Labs - Once weekly:  CBC/D, CMP, CPK, ESR and CRP Please leave PIC in place until doctor has seen patient or been notified Fax weekly labs to [phone] Method of administration: IV Push Method of administration may be changed at the discretion of home infusion pharmacist based upon assessment of the patient and/or caregiver's ability to self-administer the medication ordered., Disp: 19 Units, Rfl: 0   meropenem (MERREM) IVPB, Inject 500 mg into the vein daily. Indication:  pseudomonas bacteremia and pelvic osteomyelitis First Dose: Yes Last Day of Therapy:  05/03/22 Labs - Once weekly:  CBC/D, CMP, CPK, ESR and CRP Please leave PIC in place until doctor has seen patient or been notified Fax weekly labs to [phone] Method of administration: Mini-Bag Plus / Gravity Method of  administration may be changed at the discretion of home infusion pharmacist based upon assessment of the patient and/or caregiver's ability to self-administer the medication ordered., Disp: 40 Units, Rfl: 0   pantoprazole (PROTONIX) 40 MG tablet, TAKE 1 TABLET (40 MG TOTAL) BY MOUTH 2 (TWO) TIMES DAILY BEFORE A MEAL. (Patient taking differently: Take 40 mg by mouth 2 (two) times daily.), Disp: 60 tablet, Rfl: 0   predniSONE (DELTASONE) 5 MG tablet, Take 5 mg by mouth daily with breakfast., Disp: , Rfl:    sodium bicarbonate 650 MG tablet, Take 1 tablet (650 mg total) by mouth 3 (three) times daily., Disp: 90 tablet, Rfl: 0  Review of Systems: + voice changes, swelling of right leg Denies appetite changes, fevers, chills, fatigue, unexplained weight changes. Denies hearing loss, neck lumps or masses, mouth sores, ringing in ears.  Denies cough or wheezing.  Denies shortness of breath. Denies chest pain or palpitations. Denies abdominal distention, pain, blood in stools, constipation, diarrhea, nausea, vomiting, or early satiety. Denies pain with intercourse, dysuria, frequency, hematuria or incontinence. Denies hot flashes, pelvic pain, vaginal bleeding or vaginal discharge.   Denies joint pain, back pain or muscle pain/cramps. Denies itching, rash, or wounds. Denies dizziness, headaches, numbness or seizures. Denies swollen lymph nodes or glands, denies easy bruising or bleeding. Denies anxiety, depression, confusion, or decreased concentration.  Physical Exam: BP (!) 170/90 (BP Location: Left Arm)   Pulse 90   Temp 98.4 F (36.9 C) (Oral)   Resp 14   Wt 144 lb (65.3 kg)   SpO2 100%   BMI 23.96 kg/m  General: Alert, oriented, no acute distress. HEENT: Normocephalic, atraumatic, sclera anicteric. Chest: Clear to auscultation bilaterally.  No wheezes or rhonchi. Cardiovascular: Regular rate and rhythm, no murmurs. Abdomen: soft, nontender.  Normoactive bowel sounds.  No masses or  hepatosplenomegaly appreciated.   Extremities: Grossly normal range of motion.  Warm, well perfused.  2+ edema of right leg. Skin: No rashes or lesions noted. Lymphatics: No cervical, supraclavicular adenopathy.  No left inguinal adenopathy GU:  1-2+ edema of mons. External female genitalia with similar appearance from last visit although the size of her open wound has continued to decrease some.  On palpation, there is a small area where bone can be palpated at the base of this open wound.  She continues to have an open wound along the right groin with complete lack of labia majora and minora on that side.  There is continued edema of the left labia, similar.  Urethra is normal in appearance.  There is a small amount of fibrinous exudate over bone within her open wound, no purulence or bleeding.   Radiation changes of the vulva noted with significant edema.   Laboratory & Radiologic Studies: 04/18/22: LE dopplers NO evidence of DVT in lower extremities  Assessment & Plan: Christina Mack is a 57 y.o. woman with history of stage IIIC vulvar cancer presenting for surveillance visit today. Completed treatment 01/2020. Completed hyperbaric oxygen therapy for chronic right inguinal wound.   No evidence of cancer recurrence on exam today.  We had previously discussed PET scan findings which I think are likely related to her chronic infection.  Recent CT scan as well as ultrasound do not show obvious changes that raise the concern for cancer recurrence.  Patient will continue on antibiotics through the end of the month in the setting of her pelvic osteomyelitis.  She has been trying to get into see [facility] orthopedic oncology for months now.  It sounds like they are requesting her to be seen first by urology.  I will ask my clinic to send a referral to see if we can help facilitate this.  In the meantime, her renal status has become tenuous and she is at risk of needing dialysis sooner rather than later.  I  think the hope would be that there could be multidisciplinary planning between her nephrologist and Transplant team, urologist (regarding timing and safety of transplant), and Ortho oncologist.  I will see the patient back in 3 months for follow-up.  28 minutes of total time was spent for this patient encounter, including preparation, face-to-face counseling with the patient and coordination of care, and documentation of the encounter.  Jeral Pinch, MD  Division of Gynecologic Oncology  Department of Obstetrics and Gynecology  [facility]

## 2022-04-21 NOTE — Telephone Encounter (Signed)
Received call today from [place], [place] regarding concerns for swelling and elevated BP. States that today BP was 168/98 and leg circumference was 39 cm. Yesterday BP was 160/100 and leg circumference was 38 cm. Requested physical therapist call PCP today regarding elevated BP.  Molli Posey, NP.  Left voicemail with patient stating she needs to contact PCP regarding her elevated blood pressure.  Requested she call office if she has any concerns regarding antibiotics. Leatrice Jewels, RMA

## 2022-04-24 ENCOUNTER — Telehealth: Payer: Self-pay | Admitting: Oncology

## 2022-04-24 NOTE — Telephone Encounter (Signed)
Called Dr. Clois Dupes at [facility] regarding referral.  They asked that we fax the referral to [phone].

## 2022-04-25 ENCOUNTER — Encounter (HOSPITAL_COMMUNITY): Payer: Self-pay

## 2022-04-25 NOTE — Telephone Encounter (Signed)
Referral faxed successfully to [facility].

## 2022-04-26 ENCOUNTER — Ambulatory Visit: Admitting: Orthopedic Surgery

## 2022-04-28 ENCOUNTER — Encounter (HOSPITAL_COMMUNITY)
Admission: RE | Admit: 2022-04-28 | Discharge: 2022-04-28 | Disposition: A | Discharge status: Home / Self Care | Source: Ambulatory Visit | Attending: Nephrology | Admitting: Nephrology

## 2022-04-28 VITALS — BP 162/103 | HR 88 | Temp 97.1°F | Resp 17

## 2022-04-28 DIAGNOSIS — Z940 Kidney transplant status: Secondary | ICD-10-CM | POA: Insufficient documentation

## 2022-04-28 DIAGNOSIS — N184 Chronic kidney disease, stage 4 (severe): Secondary | ICD-10-CM

## 2022-04-28 LAB — POCT HEMOGLOBIN-HEMACUE: Hemoglobin: 7.1 g/dL — ABNORMAL LOW (ref 12.0–15.0)

## 2022-04-28 MED ORDER — EPOETIN ALFA-EPBX 10000 UNIT/ML IJ SOLN
20000.0000 [IU] | INTRAMUSCULAR | Status: DC
  Administered 2022-04-28: 20000 [IU] via SUBCUTANEOUS

## 2022-04-28 MED ORDER — EPOETIN ALFA-EPBX 10000 UNIT/ML IJ SOLN
INTRAMUSCULAR | Status: AC
  Filled 2022-04-28: qty 2

## 2022-05-02 ENCOUNTER — Telehealth: Payer: Self-pay | Admitting: Oncology

## 2022-05-02 NOTE — Telephone Encounter (Signed)
[facility] to check on referral.  They are working to get her referral in her chart and said to call back tomorrow.

## 2022-05-03 ENCOUNTER — Ambulatory Visit (INDEPENDENT_AMBULATORY_CARE_PROVIDER_SITE_OTHER): Admitting: Orthopedic Surgery

## 2022-05-03 ENCOUNTER — Encounter: Payer: Self-pay | Admitting: Orthopedic Surgery

## 2022-05-03 ENCOUNTER — Ambulatory Visit (INDEPENDENT_AMBULATORY_CARE_PROVIDER_SITE_OTHER)

## 2022-05-03 ENCOUNTER — Other Ambulatory Visit: Payer: Self-pay | Admitting: Radiology

## 2022-05-03 DIAGNOSIS — S82121A Displaced fracture of lateral condyle of right tibia, initial encounter for closed fracture: Secondary | ICD-10-CM

## 2022-05-03 NOTE — Progress Notes (Signed)
Office Visit Note   Patient: Christina Mack           Date of Birth: [birth year]           MRN: [id number] Visit Date: 05/03/2022 Requested by: No referring provider defined for this encounter. PCP: System, Provider Not In  Subjective: Chief Complaint  Patient presents with   Right Leg - Follow-up    HPI: Christina Mack is a 57 y.o. female who presents to the office reporting right leg swelling.  Patient has had swelling in the right leg for the last 2 weeks.  She had an ultrasound about 3 weeks ago which was negative for DVT.  She has a history of osteomyelitis in her pelvis on the right-hand side.  She is not having any fevers or chills.  She had fracture fixation in the right proximal tibial plateau about 4 months ago..                ROS: All systems reviewed are negative as they relate to the chief complaint within the history of present illness.  Patient denies fevers or chills.  Assessment & Plan: Visit Diagnoses:  1. Closed fracture of lateral portion of right tibial plateau, initial encounter     Plan: Impression is right leg swelling likely from lymphedema.  Involving the whole leg.  There is no effusion in the knee no redness to the incision.  Overall the knee looks good.  She is currently scheduled for lymphedema treatment.  She will follow-up with [place] as needed.  Follow-Up Instructions: No follow-ups on file.   Orders:  Orders Placed This Encounter  Procedures   XR Knee 1-2 Views Right   No orders of the defined types were placed in this encounter.     Procedures: No procedures performed   Clinical Data: No additional findings.  Objective: Vital Signs: There were no vitals taken for this visit.  Physical Exam:  Constitutional: Patient appears well-developed HEENT:  Head: Normocephalic Eyes:EOM are normal Neck: Normal range of motion Cardiovascular: Normal rate Pulmonary/chest: Effort normal Neurologic: Patient is alert Skin: Skin is warm Psychiatric:  Patient has normal mood and affect  Ortho Exam: Ortho exam demonstrates full active and passive range of motion of the right knee.  No effusion.  She does have swelling both in the calf and thigh compared to the left-hand side.  No warmth or erythema induration or redness to the incision.  Specialty Comments:  No specialty comments available.  Imaging: XR Knee 1-2 Views Right  Result Date: 05/03/2022 AP lateral radiographs right knee reviewed.  Tibial plateau fixation in good position alignment.  No lucencies.  No joint space collapse.    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   Pancytopenia ([place]) 01/19/2022   Flexural atopic dermatitis 11/10/2021   At risk for adverse drug interaction 10/13/2021   Infective myositis of right thigh 09/16/2021   Long term (current) use of antibiotics 09/16/2021   Osteomyelitis of right pelvic  region and thigh ([place]) 05/17/2021   History of healed fragility fracture 05/17/2021   Leg weakness, bilateral 09/15/2020   Screening for malignant neoplasm of skin 05/31/2020   Immunocompromised state due to drug therapy ([place]) 05/18/2020   Chronic kidney disease (CKD), stage IV (severe) (HCC)    Chronic pain syndrome    Cancer associated pain    Debility    Normocytic anemia 04/26/2020   CKD (chronic kidney disease) stage 4, GFR 15-29 ml/min ([place]) 04/26/2020   Wound of right groin 04/26/2020   Gastric ulcer without hemorrhage or perforation  04/26/2020   Malnutrition of moderate degree 04/26/2020   Acute deep vein thrombosis (DVT) of femoral vein ([place]) 12/05/2019   Iron deficiency anemia 11/10/2019   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([place]) 11/06/2019   Sprain of anterior talofibular ligament of right ankle 11/22/2016   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   Aftercare following organ transplant 02/24/2013   Immunosuppression ([place]) 02/24/2013   Kidney transplant status 11/22/2012   HTN (hypertension) 11/22/2012   Hyperparathyroidism, 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   Past Medical History:  Diagnosis Date   Complication of anesthesia    nausea and vomiting   DVT (deep venous thrombosis) (HCC)    History of osteomyelitis    Kidney transplant recipient    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([place])     Family History  Problem Relation Age of Onset   Cerebral aneurysm Mother    AAA (abdominal aortic aneurysm) Brother    Breast cancer Paternal Aunt    Heart attack Maternal Grandmother    Brain cancer Maternal Grandfather    Colon cancer Neg Hx    Ovarian cancer Neg Hx    Endometrial cancer Neg Hx    Pancreatic cancer Neg Hx    Prostate cancer Neg Hx     Past Surgical History:  Procedure Laterality Date   APPLICATION OF WOUND VAC Right 01/22/2022   Procedure: APPLICATION OF WOUND VAC;  Surgeon: Leandrew Koyanagi, MD;  Location: [place];  Service: Orthopedics;  Laterality: Right;   BIOPSY  04/24/2020   Procedure: BIOPSY;  Surgeon: Doran Stabler, MD;  Location: [place];  Service: Gastroenterology;;   ESOPHAGOGASTRODUODENOSCOPY (EGD) WITH PROPOFOL N/A 04/24/2020   Procedure: ESOPHAGOGASTRODUODENOSCOPY (EGD) WITH PROPOFOL;  Surgeon: Doran Stabler, MD;  Location: [place];  Service: Gastroenterology;  Laterality: N/A;   IR FLUORO GUIDE CV LINE RIGHT  03/24/2022   IR PERC TUN PERIT CATH WO PORT S&I /IMAG  03/24/2022   IR US GUIDE BX ASP/DRAIN  04/30/2020   IR US GUIDE VASC ACCESS RIGHT  03/24/2022    KIDNEY TRANSPLANT  2001   KIDNEY TRANSPLANT  2014   LEEP     ORIF TIBIA PLATEAU Right 01/22/2022   Procedure: OPEN REDUCTION INTERNAL FIXATION (ORIF) TIBIAL PLATEAU;  Surgeon: Meredith Pel, MD;  Location: [place];  Service: Orthopedics;  Laterality: Right;   TOTAL ABDOMINAL HYSTERECTOMY     ovaries left in situ   TOTAL HIP ARTHROPLASTY Right 01/22/2022   Procedure: TOTAL HIP ARTHROPLASTY ANTERIOR APPROACH;  Surgeon: Leandrew Koyanagi, MD;  Location: [place];  Service: Orthopedics;  Laterality: Right;   Social History   Occupational History   Not on file  Tobacco Use   Smoking status: Never   Smokeless tobacco: Never  Vaping Use   Vaping Use: Never used  Substance and Sexual Activity   Alcohol use: Not Currently   Drug use: Never   Sexual  activity: Not Currently

## 2022-05-04 ENCOUNTER — Telehealth: Payer: Self-pay | Admitting: Oncology

## 2022-05-04 NOTE — Telephone Encounter (Signed)
Left a message for Roselyn Meier, NP that patient is ok to proceed with lymphedema treatment from the [place] per Joylene John, NP.

## 2022-05-05 ENCOUNTER — Telehealth (HOSPITAL_COMMUNITY): Payer: Self-pay | Admitting: Radiology

## 2022-05-05 NOTE — Telephone Encounter (Signed)
Pt called and left a VM with questions about her appt for 2/5. Patient can eat/drink up until 8 am that morning, as her appt is not until 2 pm. She needs 24 hr HOME supervision. She will not be kept in house for that time. Left message with those instructions and for her to call back if she has any further questions. JM

## 2022-05-08 ENCOUNTER — Telehealth: Admitting: Infectious Diseases

## 2022-05-08 ENCOUNTER — Other Ambulatory Visit: Payer: Self-pay | Admitting: Infectious Diseases

## 2022-05-08 ENCOUNTER — Ambulatory Visit (INDEPENDENT_AMBULATORY_CARE_PROVIDER_SITE_OTHER): Admitting: Infectious Diseases

## 2022-05-08 ENCOUNTER — Ambulatory Visit (HOSPITAL_COMMUNITY)
Admission: RE | Admit: 2022-05-08 | Discharge: 2022-05-08 | Disposition: A | Discharge status: Home / Self Care | Source: Ambulatory Visit | Attending: Infectious Diseases | Admitting: Infectious Diseases

## 2022-05-08 ENCOUNTER — Other Ambulatory Visit: Payer: Self-pay

## 2022-05-08 ENCOUNTER — Encounter (HOSPITAL_COMMUNITY): Payer: Self-pay

## 2022-05-08 ENCOUNTER — Encounter: Payer: Self-pay | Admitting: Infectious Diseases

## 2022-05-08 VITALS — BP 169/96 | HR 106 | Temp 98.8°F | Resp 16 | Ht 65.0 in | Wt 146.0 lb

## 2022-05-08 DIAGNOSIS — S31109S Unspecified open wound of abdominal wall, unspecified quadrant without penetration into peritoneal cavity, sequela: Secondary | ICD-10-CM

## 2022-05-08 DIAGNOSIS — S31109D Unspecified open wound of abdominal wall, unspecified quadrant without penetration into peritoneal cavity, subsequent encounter: Secondary | ICD-10-CM

## 2022-05-08 DIAGNOSIS — M86151 Other acute osteomyelitis, right femur: Secondary | ICD-10-CM | POA: Diagnosis not present

## 2022-05-08 DIAGNOSIS — Z96641 Presence of right artificial hip joint: Secondary | ICD-10-CM

## 2022-05-08 DIAGNOSIS — Z452 Encounter for adjustment and management of vascular access device: Secondary | ICD-10-CM | POA: Insufficient documentation

## 2022-05-08 DIAGNOSIS — I890 Lymphedema, not elsewhere classified: Secondary | ICD-10-CM

## 2022-05-08 DIAGNOSIS — B965 Pseudomonas (aeruginosa) (mallei) (pseudomallei) as the cause of diseases classified elsewhere: Secondary | ICD-10-CM

## 2022-05-08 DIAGNOSIS — N186 End stage renal disease: Secondary | ICD-10-CM | POA: Insufficient documentation

## 2022-05-08 DIAGNOSIS — M8660 Other chronic osteomyelitis, unspecified site: Secondary | ICD-10-CM

## 2022-05-08 DIAGNOSIS — Z8249 Family history of ischemic heart disease and other diseases of the circulatory system: Secondary | ICD-10-CM | POA: Diagnosis not present

## 2022-05-08 DIAGNOSIS — Z792 Long term (current) use of antibiotics: Secondary | ICD-10-CM

## 2022-05-08 DIAGNOSIS — Z940 Kidney transplant status: Secondary | ICD-10-CM | POA: Insufficient documentation

## 2022-05-08 DIAGNOSIS — N184 Chronic kidney disease, stage 4 (severe): Secondary | ICD-10-CM

## 2022-05-08 DIAGNOSIS — D071 Carcinoma in situ of vulva: Secondary | ICD-10-CM

## 2022-05-08 HISTORY — PX: IR REMOVAL TUN CV CATH W/O FL: IMG2289

## 2022-05-08 MED ORDER — SODIUM CHLORIDE 0.9 % IV SOLN: INTRAVENOUS | Status: DC

## 2022-05-08 MED ORDER — LIDOCAINE-EPINEPHRINE 1 %-1:100000 IJ SOLN
INTRAMUSCULAR | Status: AC
  Filled 2022-05-08: qty 1

## 2022-05-08 MED ORDER — FENTANYL CITRATE (PF) 100 MCG/2ML IJ SOLN
INTRAMUSCULAR | Status: AC
  Filled 2022-05-08: qty 2

## 2022-05-08 MED ORDER — CHLORHEXIDINE GLUCONATE 4 % EX LIQD
CUTANEOUS | Status: AC
  Filled 2022-05-08: qty 15

## 2022-05-08 MED ORDER — FENTANYL CITRATE (PF) 100 MCG/2ML IJ SOLN
INTRAMUSCULAR | Status: AC | PRN
  Administered 2022-05-08 (×2): 25 ug via INTRAVENOUS

## 2022-05-08 MED ORDER — MIDAZOLAM HCL 2 MG/2ML IJ SOLN
INTRAMUSCULAR | Status: AC | PRN
  Administered 2022-05-08 (×2): 1 mg via INTRAVENOUS

## 2022-05-08 MED ORDER — MIDAZOLAM HCL 2 MG/2ML IJ SOLN
INTRAMUSCULAR | Status: AC
  Filled 2022-05-08: qty 2

## 2022-05-08 NOTE — Assessment & Plan Note (Signed)
Stage IV/V.  Creatinine recently 6.39.  Will forward results to Dr. Joelyn Oms for management.  She has had ongoing conversations and discussions about the potential she may need to resume dialysis in near future.  Continues to maintain adequate urine output.

## 2022-05-08 NOTE — Assessment & Plan Note (Signed)
Nuclear study ruled out any involvement of the her prosthesis.  She continues to do well and recover from her hip surgery at the end of last year.

## 2022-05-08 NOTE — Procedures (Signed)
Interventional Radiology Procedure Note  Date of Procedure: 05/08/2022  Procedure: Removal of tunneled CVL  Findings:  1. Removal of tunneled right CVL    Complications: No immediate complications noted.   Estimated Blood Loss: minimal  Follow-up and Recommendations: 1. Discharge home    Albin Felling, MD  Vascular & Interventional Radiology  05/08/2022 4:03 PM

## 2022-05-08 NOTE — Assessment & Plan Note (Addendum)
Shayley has done well on intravenous therapy with daptomycin and meropenem.  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  She has an appointment with [facility] oncology coming up at the end of February.  This is an consideration of surgical management for her chronic infection that has relapsed despite multiple treatment attempts.  She has a history of drug-resistant bacteria (VRE, Pseudomonas) and ongoing large open wound.  Ideally would like to get her wound flapped and address any residual osteomyelitis in the pelvis in hopes to cure the infection especially if she wishes to consider a another renal transplant down the road.  Okay to remove PICC line today.  Her inflammatory markers have essentially normalized with ESR 47 (normal less than 40) and CRP 12 (normal less than 10).  I will plan to call Nathan early March to see how her appointment went with [facility] and what next steps are.

## 2022-05-08 NOTE — H&P (Signed)
Chief Complaint: Tunneled central catheter removal. No longer needed   Referring Physician(s): Dixon,Stephanie N  Supervising Physician: Juliet Rude  Patient Status: [facility] - Out-pt  History of Present Illness: Christina Mack is a 57 y.o. female outpatient. History of ESRD s/p transplant. Recently admitted to [facility] for osteomyelitis of the right pelvis secondary to bacteremia. Cultures positive for pseudomonas. IR placed a tunneled RIJ central catheter for IV antibiotics on 12.22.24. Patient has since completed antibiotic treatment. Team is requesting tunneled central catheter removal.   Patient alert and laying in bed. Patient requests sedation for the removal. Denies any fevers, headache, chest pain, SOB, cough, abdominal pain, nausea, vomiting or bleeding. Return precautions and treatment recommendations and follow-up discussed with the patient  who is agreeable with the plan.    Past Medical History:  Diagnosis Date   Complication of anesthesia    nausea and vomiting   DVT (deep venous thrombosis) (HCC)    History of osteomyelitis    Kidney transplant recipient    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[facility])     Past Surgical History:  Procedure Laterality Date   APPLICATION OF WOUND VAC Right 01/22/2022   Procedure: APPLICATION OF WOUND VAC;  Surgeon: Leandrew Koyanagi, MD;  Location: [place];  Service: Orthopedics;  Laterality: Right;   BIOPSY  04/24/2020   Procedure: BIOPSY;  Surgeon: Doran Stabler, MD;  Location: [place];  Service: Gastroenterology;;   ESOPHAGOGASTRODUODENOSCOPY (EGD) WITH PROPOFOL N/A 04/24/2020   Procedure: ESOPHAGOGASTRODUODENOSCOPY (EGD) WITH PROPOFOL;  Surgeon: Doran Stabler, MD;  Location: [place];  Service: Gastroenterology;  Laterality: N/A;   IR FLUORO GUIDE CV LINE RIGHT  03/24/2022   IR PERC TUN PERIT CATH WO PORT S&I /IMAG  03/24/2022   IR US GUIDE BX ASP/DRAIN  04/30/2020   IR US GUIDE VASC ACCESS RIGHT  03/24/2022   KIDNEY TRANSPLANT   2001   KIDNEY TRANSPLANT  2014   LEEP     ORIF TIBIA PLATEAU Right 01/22/2022   Procedure: OPEN REDUCTION INTERNAL FIXATION (ORIF) TIBIAL PLATEAU;  Surgeon: Meredith Pel, MD;  Location: [place];  Service: Orthopedics;  Laterality: Right;   TOTAL ABDOMINAL HYSTERECTOMY     ovaries left in situ   TOTAL HIP ARTHROPLASTY Right 01/22/2022   Procedure: TOTAL HIP ARTHROPLASTY ANTERIOR APPROACH;  Surgeon: Leandrew Koyanagi, MD;  Location: [place];  Service: Orthopedics;  Laterality: Right;    Allergies: Oxycodone-acetaminophen, Piperacillin-tazobactam in dex, Zosyn [piperacillin sod-tazobactam so], Hydromorphone, Amlodipine, and Codeine  Medications: Prior to Admission medications   Medication Sig Start Date End Date Taking? Authorizing Provider  acetaminophen (TYLENOL) 500 MG tablet Take 2 tablets (1,000 mg total) by mouth 3 (three) times daily. Patient taking differently: Take 1,000 mg by mouth 3 (three) times daily as needed for moderate pain. 05/05/20  Yes Gaylan Gerold, DO  hydrALAZINE (APRESOLINE) 25 MG tablet Take by mouth. 05/04/22 05/04/23 Yes [provider]  pantoprazole (PROTONIX) 40 MG tablet TAKE 1 TABLET (40 MG TOTAL) BY MOUTH 2 (TWO) TIMES DAILY BEFORE A MEAL. Patient taking differently: Take 40 mg by mouth 2 (two) times daily. 05/17/20 05/08/22 Yes Love, Ivan Anchors, PA-C  predniSONE (DELTASONE) 5 MG tablet Take 5 mg by mouth daily with breakfast.   Yes [provider]  carvedilol (COREG) 12.5 MG tablet Take 1 tablet (12.5 mg total) by mouth 2 (two) times daily with a meal. Patient not taking: Reported on 05/08/2022 03/25/22   Dessa Phi, DO  sodium bicarbonate  650 MG tablet Take 1 tablet (650 mg total) by mouth 3 (three) times daily. Patient not taking: Reported on 05/08/2022 03/25/22   Dessa Phi, DO     Family History  Problem Relation Age of Onset   Cerebral aneurysm Mother    AAA (abdominal aortic aneurysm) Brother    Breast cancer Paternal Aunt    Heart  attack Maternal Grandmother    Brain cancer Maternal Grandfather    Colon cancer Neg Hx    Ovarian cancer Neg Hx    Endometrial cancer Neg Hx    Pancreatic cancer Neg Hx    Prostate cancer Neg Hx     Social History   Socioeconomic History   Marital status: Married    Spouse name: Not on file   Number of children: Not on file   Years of education: Not on file   Highest education level: Not on file  Occupational History   Not on file  Tobacco Use   Smoking status: Never   Smokeless tobacco: Never  Vaping Use   Vaping Use: Never used  Substance and Sexual Activity   Alcohol use: Not Currently   Drug use: Never   Sexual activity: Not Currently  Other Topics Concern   Not on file  Social History Narrative   Not on file   Social Determinants of Health   Financial Resource Strain: Not on file  Food Insecurity: No Food Insecurity (03/20/2022)   Hunger Vital Sign    Worried About Running Out of Food in the Last Year: Never true    Ran Out of Food in the Last Year: Never true  Transportation Needs: No Transportation Needs (03/20/2022)   PRAPARE - Hydrologist (Medical): No    Lack of Transportation (Non-Medical): No  Physical Activity: Not on file  Stress: Not on file  Social Connections: Not on file    Review of Systems: A 12 point ROS discussed and pertinent positives are indicated in the HPI above.  All other systems are negative.  Review of Systems  Constitutional:  Negative for fatigue and fever.  HENT:  Negative for congestion.   Respiratory:  Negative for cough and shortness of breath.   Gastrointestinal:  Negative for abdominal pain, diarrhea, nausea and vomiting.    Vital Signs: BP (!) 151/96 Comment: rn aware  Pulse (!) 102   Temp (!) 97 F (36.1 C) (Temporal)   Resp 18   Ht '5\' 5"'$  (1.651 m)   Wt 146 lb (66.2 kg)   SpO2 100%   BMI 24.30 kg/m     Physical Exam Vitals and nursing note reviewed.  Constitutional:       Appearance: She is well-developed.  HENT:     Head: Normocephalic and atraumatic.  Eyes:     Conjunctiva/sclera: Conjunctivae normal.  Cardiovascular:     Rate and Rhythm: Normal rate and regular rhythm.     Heart sounds: Normal heart sounds.  Pulmonary:     Effort: Pulmonary effort is normal.  Musculoskeletal:        General: Normal range of motion.     Cervical back: Normal range of motion.  Skin:    General: Skin is warm.  Neurological:     Mental Status: She is alert and oriented to person, place, and time.     Imaging: XR Knee 1-2 Views Right  Result Date: 05/03/2022 AP lateral radiographs right knee reviewed.  Tibial plateau fixation in good position alignment.  No  lucencies.  No joint space collapse.  VAS [place] LOWER EXTREMITY VENOUS (DVT) (7a-7p)  Result Date: 04/18/2022  Lower Venous DVT Study Patient Name:  MELAH EBLING  Date of Exam:   04/18/2022 Medical Rec #: [id number]     Accession #:    [id number] Date of Birth: February 05, 1966    Patient Gender: F Patient Age:   66 years Exam [facility] Procedure:      VAS [place] LOWER EXTREMITY VENOUS (DVT) Referring Phys: Jerene Pitch SMALL --------------------------------------------------------------------------------  Indications: Swelling.  Risk Factors: None identified. Limitations: Poor ultrasound/tissue interface. Comparison Study: No prior studies. Performing Technologist: Oliver Hum RVT  Examination Guidelines: A complete evaluation includes B-mode imaging, spectral Doppler, color Doppler, and power Doppler as needed of all accessible portions of each vessel. Bilateral testing is considered an integral part of a complete examination. Limited examinations for reoccurring indications may be performed as noted. The reflux portion of the exam is performed with the patient in reverse Trendelenburg.  +---------+---------------+---------+-----------+----------+--------------+ RIGHT     CompressibilityPhasicitySpontaneityPropertiesThrombus Aging +---------+---------------+---------+-----------+----------+--------------+ CFV      Full           Yes      Yes                                 +---------+---------------+---------+-----------+----------+--------------+ SFJ      Full                                                        +---------+---------------+---------+-----------+----------+--------------+ FV Prox  Full           Yes      Yes                                 +---------+---------------+---------+-----------+----------+--------------+ FV Mid                  Yes      Yes                                 +---------+---------------+---------+-----------+----------+--------------+ FV Distal               Yes      Yes                                 +---------+---------------+---------+-----------+----------+--------------+ PFV      Full                                                        +---------+---------------+---------+-----------+----------+--------------+ POP      Full           Yes      Yes                                 +---------+---------------+---------+-----------+----------+--------------+ PTV      Full                                                        +---------+---------------+---------+-----------+----------+--------------+  PERO     Full                                                        +---------+---------------+---------+-----------+----------+--------------+   +----+---------------+---------+-----------+----------+--------------+ LEFTCompressibilityPhasicitySpontaneityPropertiesThrombus Aging +----+---------------+---------+-----------+----------+--------------+ CFV Full           Yes      Yes                                 +----+---------------+---------+-----------+----------+--------------+     Summary: RIGHT: - There is no evidence of deep vein thrombosis in the lower  extremity. However, portions of this examination were limited- see technologist comments above.  - No cystic structure found in the popliteal fossa.  LEFT: - No evidence of common femoral vein obstruction.  *See table(s) above for measurements and observations. Electronically signed by Deitra Mayo MD on 04/18/2022 at 2:40:43 PM.    Final    XR HIP UNILAT W OR W/O PELVIS 2-3 VIEWS LEFT  Result Date: 04/13/2022 Well-seated prosthesis without complication   Labs:  CBC: Recent Labs    03/23/22 0250 03/24/22 0536 03/25/22 0402 03/25/22 1525 04/18/22 1209 04/28/22 1015  WBC 1.0* 1.9* 2.1*  --  5.0  --   HGB 7.9* 7.2* 6.5* 7.6* 5.5* 7.1*  HCT 23.9*  22.9* 22.3* 20.2* 23.2* 18.2*  --   PLT 21* 22* 26*  --  122*  --     COAGS: Recent Labs    01/19/22 1251 03/19/22 1400 03/22/22 1634 04/18/22 1209  INR 1.1 1.5* 1.2 1.1  APTT  --   --  35  --     BMP: Recent Labs    03/23/22 0250 03/24/22 0536 03/25/22 0402 04/18/22 1209  NA 136 134* 137 136  K 3.2* 3.0* 3.1* 4.3  CL 109 109 115* 109  CO2 14* 13* 13* 17*  GLUCOSE 125* 106* 110* 141*  BUN 107* 113* 115* 65*  CALCIUM 7.2* 7.4* 7.4* 7.8*  CREATININE 7.36* 6.97* 6.88* 5.97*  GFRNONAA 6* 6* 7* 8*    LIVER FUNCTION TESTS: Recent Labs    03/19/22 1202 03/21/22 0253 03/23/22 0250 03/24/22 0536 03/25/22 0402 04/18/22 1209  BILITOT 0.7  --   --   --   --  0.5  AST 22  --   --   --   --  23  ALT 9  --   --   --   --  9  ALKPHOS 59  --   --   --   --  66  PROT 7.7  --   --   --   --  7.9  ALBUMIN 3.4*   < > 2.5* 2.6* 2.4* 3.1*   < > = values in this interval not displayed.     Assessment and Plan:  57 y.o female outpatient. History of ESRD s/p transplant. Recently admitted to [facility] for osteomyelitis of the right pelvis secondary to bacteremia. Cultures positive for pseudomonas. IR placed a tunneled RIJ central catheter for IV antibiotics on 12.22.24. Patient has since completed antibiotic treatment.  Team is requesting tunneled central catheter removal. Case approved to be performed using conscious sedation.  Allergies include oxycodone, zosyn, hydromorphone. Patient has been NPO since 8 am.   Risks and benefits of  image guided  tunneled central catheter removal was discussed with the patient including, but not limited to bleeding, infection, pneumothorax, or fibrin sheath development and need for additional procedures.  All of the patient's questions were answered, patient is agreeable to proceed. Consent signed and in chart.   Thank you for this interesting consult.  I greatly enjoyed meeting SRISHTI STRNAD and look forward to participating in their care.  A copy of this report was sent to the requesting provider on this date.  Electronically Signed: Jacqualine Mau, NP 05/08/2022, 3:06 PM   I spent a total of  30 Minutes   in face to face in clinical consultation, greater than 50% of which was counseling/coordinating care for removal of tunneled central catheter

## 2022-05-08 NOTE — Assessment & Plan Note (Addendum)
Awaiting referrals to [facility] plastic surgery team.  Discussed that she will be constantly at risk for new infections in the setting of large open wound that remains.  She has completed 60 sessions of hyperbaric treatments with the wound clinic at [facility].  Not currently working with any wound treatment team now.  I recommended she continue using the alginate packing once daily, replace if heavily soiled.

## 2022-05-08 NOTE — Assessment & Plan Note (Signed)
All outpatient lab work has been reviewed.  The most recent creatinine level was 6.39 as of January 30.  Her hemoglobin increased from 5.6-7.4.  Seems to have stayed stable in this range.  Platelet count 120.  ESR only mildly elevated at 47 and CRP also only mildly elevated at 12 (normal range <10).

## 2022-05-08 NOTE — Assessment & Plan Note (Signed)
I do not think the lymphedema has anything to do with her PICC line or antibiotics.  She certainly has alternative explanation for this with extensive lymph node involvement and radiation therapy in the past to her pelvis.  There is no signs of infection on her leg today.  I agree with management of lymphedema through clinic and exercises/compression.

## 2022-05-08 NOTE — Progress Notes (Signed)
Patient: Christina Mack  DOB: 1965/08/19 MRN: [id number] PCP: System, Provider Not In    Subjective:   Chief Complaint  Patient presents with   Follow-up    Acute osteomyelitis of right pelvic region and thigh        HPI:  Christina Mack is here for follow-up on osteomyelitis of the right pelvis with secondary bacteremia.  Her husband is here with her and joins her for follow-up.  She was hospitalized at [facility] for weakness, acute on chronic renal failure from 12/17 - 03/25/22. Bacteremia with pseudomonas (R-quinolones) along with severe thrombocytopenia, leucopenia and anemia likely due to s/e from chronic linezolid in the setting of worsening renal function.  She was placed on a course of intravenous daptomycin and meropenem via a tunneled central lumen.  Over the course of her time on treatment her hemoglobin had acutely dropped to less than 6 requiring transfusion in the emergency room.  She is also had worsening renal failure throughout this timeframe and working with transplant nephrologist and local nephrologist (Dr. Joelyn Oms).  She has completed her course of daptomycin and meropenem last week January 31.  She has plans to get her tunneled line removed in interventional radiology this afternoon.  She is very worried about this causing some pain and trauma based off of previous experiences.  Requested sedation.  Her right hip has not bothered her and she is walking independently without aid.  She has since started struggling with pretty massive swelling of the right lower extremity from hip to foot.  Awaiting referral for lymphedema clinic.  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  This started about a week into her antibiotics.  She is worried that it might get worse if we take the PICC line out and stop antibiotics.  She finally got an appointment with Dr. Cherre Blanc at [facility] oncology team on February 27.  She has not  noticed any fevers chills.  She says the wound drainage has improved and feels like the tissue has grown in more and less bone is palpable.  She is asking what kind of dressings to continue, she has ongoing silver alginate at home that she has been using.  She has a fair supply left.    She sees her nephrology team in a few weeks.  Started Aranesp injections.  Her blood pressure has been very high lately, today it is worse because she is very nervous about her upcoming procedure.    Review of Systems  Constitutional:  Negative for chills, fever and weight loss.  Respiratory: Negative.    Cardiovascular:  Negative for leg swelling.  Gastrointestinal:  Negative for abdominal pain, diarrhea, nausea and vomiting.  Genitourinary: Negative.   Musculoskeletal:  Negative for back pain and joint pain.  Skin:  Negative for rash.  Neurological: Negative.   Psychiatric/Behavioral:  The patient is nervous/anxious.       Past Medical History:  Diagnosis Date   Complication of anesthesia    nausea and vomiting   DVT (deep venous thrombosis) (HCC)    History of osteomyelitis    Kidney transplant recipient    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[facility])     Outpatient Medications Prior to Visit  Medication Sig Dispense Refill   acetaminophen (TYLENOL) 500 MG tablet Take 2 tablets (1,000 mg total) by mouth 3 (three) times daily. (Patient taking differently: Take 1,000 mg by mouth 3 (three) times daily as  needed for moderate pain.) 30 tablet 0   hydrALAZINE (APRESOLINE) 25 MG tablet Take by mouth.     pantoprazole (PROTONIX) 40 MG tablet TAKE 1 TABLET (40 MG TOTAL) BY MOUTH 2 (TWO) TIMES DAILY BEFORE A MEAL. (Patient taking differently: Take 40 mg by mouth 2 (two) times daily.) 60 tablet 0   predniSONE (DELTASONE) 5 MG tablet Take 5 mg by mouth daily with breakfast.     carvedilol (COREG) 12.5 MG tablet Take 1 tablet (12.5 mg total) by mouth 2 (two) times daily with a meal. (Patient not taking: Reported on 05/08/2022) 60  tablet 0   sodium bicarbonate 650 MG tablet Take 1 tablet (650 mg total) by mouth 3 (three) times daily. (Patient not taking: Reported on 05/08/2022) 90 tablet 0   No facility-administered medications prior to visit.     Allergies  Allergen Reactions   Oxycodone-Acetaminophen Itching, Nausea And Vomiting, Nausea Only, Rash and Other (See Comments)    Headaches, also    Piperacillin-Tazobactam In Dex     Other Reaction(s): Other (See Comments)  Thrombocytopenia  Thrombocytopenia   Zosyn [Piperacillin Sod-Tazobactam So] Other (See Comments)    Thrombocytopenia   Hydromorphone Nausea And Vomiting, Rash and Other (See Comments)   Amlodipine Other (See Comments) and Swelling    Ankle swelling   Codeine     vomiting    Social History   Tobacco Use   Smoking status: Never   Smokeless tobacco: Never  Vaping Use   Vaping Use: Never used  Substance Use Topics   Alcohol use: Not Currently   Drug use: Never     Objective:   Vitals:   05/08/22 0943 05/08/22 0944  BP: (!) 175/100 (!) 169/96  Pulse: (!) 106   Resp: 16   Temp: 98.8 F (37.1 C)   TempSrc: Oral   SpO2: 100%   Weight: 146 lb (66.2 kg)   Height: '5\' 5"'$  (1.651 m)    Body mass index is 24.3 kg/m.  Physical Exam Constitutional:      Appearance: Normal appearance. She is not ill-appearing.  HENT:     Mouth/Throat:     Mouth: Mucous membranes are moist.     Pharynx: Oropharynx is clear.  Eyes:     General: No scleral icterus. Cardiovascular:     Rate and Rhythm: Normal rate and regular rhythm.  Pulmonary:     Effort: Pulmonary effort is normal.  Musculoskeletal:     Right lower leg: Edema present.     Comments: She has pitting edema from her right foot all the way up to her right hip.  Her skin is normal color and temperature.  Skin:    General: Skin is warm and dry.  Neurological:     Mental Status: She is oriented to person, place, and time.  Psychiatric:        Mood and Affect: Mood normal.         Thought Content: Thought content normal.     Lab Results: Lab Results  Component Value Date   WBC 5.0 04/18/2022   HGB 7.1 (L) 04/28/2022   HCT 18.2 (L) 04/18/2022   MCV 98.4 04/18/2022   PLT 122 (L) 04/18/2022    Lab Results  Component Value Date   CREATININE 5.97 (H) 04/18/2022   BUN 65 (H) 04/18/2022   NA 136 04/18/2022   K 4.3 04/18/2022   CL 109 04/18/2022   CO2 17 (L) 04/18/2022    Lab Results  Component Value Date  ALT 9 04/18/2022   AST 23 04/18/2022   ALKPHOS 66 04/18/2022   BILITOT 0.5 04/18/2022     Assessment & Plan:   Problem List Items Addressed This Visit       Unprioritized   CKD (chronic kidney disease) stage 4, GFR 15-29 ml/min (HCC)    Stage IV/V.  Creatinine recently 6.39.  Will forward results to Dr. Joelyn Oms for management.  She has had ongoing conversations and discussions about the potential she may need to resume dialysis in near future.  Continues to maintain adequate urine output.      Wound of right groin    Awaiting referrals to [facility] plastic surgery team.  Discussed that she will be constantly at risk for new infections in the setting of large open wound that remains.  She has completed 60 sessions of hyperbaric treatments with the wound clinic at [facility].  Not currently working with any wound treatment team now.  I recommended she continue using the alginate packing once daily, replace if heavily soiled.      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  She has an appointment with [facility] oncology coming up at the end of February.  This is an consideration of surgical management for her chronic infection that has relapsed despite multiple treatment attempts.  She has a history of drug-resistant bacteria (VRE, Pseudomonas) and  ongoing large open wound.  Ideally would like to get her wound flapped and address any residual osteomyelitis in the pelvis in hopes to cure the infection especially if she wishes to consider a another renal transplant down the road.  Okay to remove PICC line today.  Her inflammatory markers have essentially normalized with ESR 47 (normal less than 40) and CRP 12 (normal less than 10).  I will plan to call [place] early March to see how her appointment went with [facility] and what next steps are.       Long term (current) use of antibiotics - Primary    All outpatient lab work has been reviewed.  The most recent creatinine level was 6.39 as of January 30.  Her hemoglobin increased from 5.6-7.4.  Seems to have stayed stable in this range.  Platelet count 120.  ESR only mildly elevated at 47 and CRP also only mildly elevated at 12 (normal range <10).         Status post total replacement of right hip    Nuclear study ruled out any involvement of the her prosthesis.  She continues to do well and recover from her hip surgery at the end of last year.      Lymphedema of right lower extremity    I do not think the lymphedema has anything to do with her PICC line or antibiotics.  She certainly has alternative explanation for this with extensive lymph node involvement and radiation therapy in the past to her pelvis.  There is no signs of infection on her leg today.  I agree with management of lymphedema through clinic and exercises/compression.       [name], MSN, NP-C [facility] [place] Pager: [phone] Office: [phone]  05/08/22  1:28 PM

## 2022-05-08 NOTE — Patient Instructions (Addendum)
Nice to see you today.   Plan to get your IV out.   Agree with plan for lymphedema clinic.   Will plan to touch base with you again over the phone in early March.

## 2022-05-11 ENCOUNTER — Telehealth: Payer: Self-pay | Admitting: Oncology

## 2022-05-11 NOTE — Telephone Encounter (Signed)
Called Claiborne Billings regarding the referral sent to [facility] - Dr. Terance Hart.  She said Urology called her and will not see her because she does not have a bladder problem.  She does have a an appointment on 05/30/22 with Dr. Cherre Blanc - Orthopedic Oncologist.

## 2022-05-12 ENCOUNTER — Encounter (HOSPITAL_COMMUNITY)
Admission: RE | Admit: 2022-05-12 | Discharge: 2022-05-12 | Disposition: A | Discharge status: Home / Self Care | Source: Ambulatory Visit | Attending: Nephrology | Admitting: Nephrology

## 2022-05-12 VITALS — BP 171/92 | HR 96 | Temp 98.2°F | Resp 18

## 2022-05-12 DIAGNOSIS — Z940 Kidney transplant status: Secondary | ICD-10-CM | POA: Insufficient documentation

## 2022-05-12 DIAGNOSIS — N184 Chronic kidney disease, stage 4 (severe): Secondary | ICD-10-CM | POA: Insufficient documentation

## 2022-05-12 LAB — POCT HEMOGLOBIN-HEMACUE: Hemoglobin: 6.2 g/dL — CL (ref 12.0–15.0)

## 2022-05-12 MED ORDER — EPOETIN ALFA-EPBX 10000 UNIT/ML IJ SOLN
INTRAMUSCULAR | Status: AC
  Filled 2022-05-12: qty 2

## 2022-05-12 MED ORDER — EPOETIN ALFA-EPBX 10000 UNIT/ML IJ SOLN
20000.0000 [IU] | INTRAMUSCULAR | Status: DC
  Administered 2022-05-12: 20000 [IU] via SUBCUTANEOUS

## 2022-05-12 NOTE — Progress Notes (Addendum)
Left Message for Mrs Christina Mack - Regarding HGB 6.2 today. Patient denied any symptoms other than little blood on pad of wound.  Spoke with [place] at [facility] about patient's HGB 6.2, patient stated she feels fine only had little blood on pad of wound. Per Dr .Joelyn Oms patient should get normal dose as ordered of retacrit today and she should go to ER if she feels symptomatic (SOB, any blood etc). Patient has been advised .

## 2022-05-16 ENCOUNTER — Telehealth: Admitting: Emergency Medicine

## 2022-05-16 ENCOUNTER — Telehealth: Payer: Self-pay

## 2022-05-16 ENCOUNTER — Encounter (INDEPENDENT_AMBULATORY_CARE_PROVIDER_SITE_OTHER): Payer: Medicare Other

## 2022-05-16 DIAGNOSIS — L03115 Cellulitis of right lower limb: Secondary | ICD-10-CM | POA: Diagnosis not present

## 2022-05-16 DIAGNOSIS — L0390 Cellulitis, unspecified: Secondary | ICD-10-CM

## 2022-05-16 MED ORDER — CEFADROXIL 500 MG PO CAPS: 500.0000 mg | ORAL_CAPSULE | Freq: Every day | ORAL | 0 refills | Status: DC

## 2022-05-16 NOTE — Telephone Encounter (Signed)
Please see the MyChart message reply(ies) for my assessment and plan.    This patient gave consent for this Medical Advice Message and is aware that it may result in a bill to [employer], as well as the possibility of receiving a bill for a co-payment or deductible. They are an established patient, but are not seeking medical advice exclusively about a problem treated during an in person or video visit in the last seven days. I did not recommend an in person or video visit within seven days of my reply.    I spent a total of 12 minutes cumulative time within 7 days through [employer] including collaboration with clinical team and pharmacy team on plan.     [name], NP

## 2022-05-16 NOTE — Progress Notes (Signed)
Virtual Visit Consent   Christina Mack, you are scheduled for a virtual visit with a [facility] provider today. Just as with appointments in the office, your consent must be obtained to participate. Your consent will be active for this visit and any virtual visit you may have with one of our providers in the next 365 days. If you have a MyChart account, a copy of this consent can be sent to you electronically.  As this is a virtual visit, video technology does not allow for your provider to perform a traditional examination. This may limit your provider's ability to fully assess your condition. If your provider identifies any concerns that need to be evaluated in person or the need to arrange testing (such as labs, EKG, etc.), we will make arrangements to do so. Although advances in technology are sophisticated, we cannot ensure that it will always work on either your end or our end. If the connection with a video visit is poor, the visit may have to be switched to a telephone visit. With either a video or telephone visit, we are not always able to ensure that we have a secure connection.  By engaging in this virtual visit, you consent to the provision of healthcare and authorize for your insurance to be billed (if applicable) for the services provided during this visit. Depending on your insurance coverage, you may receive a charge related to this service.  I need to obtain your verbal consent now. Are you willing to proceed with your visit today? Christina Mack has provided verbal consent on 05/16/2022 for a virtual visit (video or telephone). Carvel Getting, NP  Date: 05/16/2022 9:51 AM  Virtual Visit via Video Note   I, Carvel Getting, connected with  Christina Mack  ([id number], 56/22/1967) on 05/16/22 at  9:45 AM EST by a video-enabled telemedicine application and verified that I am speaking with the correct person using two identifiers.  Location: Patient: Virtual Visit Location Patient:  Home Provider: Virtual Visit Location Provider: Home Office   I discussed the limitations of evaluation and management by telemedicine and the availability of in person appointments. The patient expressed understanding and agreed to proceed.    History of Present Illness: Christina Mack is a 57 y.o. who identifies as a female who was assigned female at birth, and is being seen today for lower leg cellulitis.  Patient reports he just recently developed.  Its about 6 inches from her foot up her lower leg around her ankle.  She has had cellulitis in the same place before about 8 months ago.  She does see infectious disease for chronic antibiotics for chronic osteomyelitis.  Review of records shows that she saw [name], NP on 05/08/2022 for her problems.  At that time she did not have symptoms of cellulitis.  The patient attempted to contact Ms. Dixon this morning and thought she was making a video appointment with Ms. Dixon and did not realize she was being directed to virtual urgent care.  HPI: HPI  Problems:  Patient Active Problem List   Diagnosis Date Noted   Lymphedema of right lower extremity 05/08/2022   Pain 03/23/2022   Renal failure 03/22/2022   Need for emotional support 03/22/2022   Goals of care, counseling/discussion 03/22/2022   Weakness 03/22/2022   Chronic osteomyelitis ([place]) 03/22/2022   Palliative care encounter 03/22/2022   Bacteremia due to Pseudomonas 03/22/2022   Pressure injury of skin 03/20/2022   AKI (acute kidney  injury) ([place]) 03/19/2022   Closed fracture of lateral portion of right tibial plateau 02/03/2022   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   Pancytopenia ([place]) 01/19/2022   At risk for adverse drug interaction 10/13/2021   Long term (current) use of antibiotics 09/16/2021   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 ([place]) 05/17/2021   History of healed fragility fracture 05/17/2021   Leg weakness, bilateral 09/15/2020   Screening  for malignant neoplasm of skin 05/31/2020   Immunocompromised state due to drug therapy ([place]) 05/18/2020   Chronic kidney disease (CKD), stage IV (severe) (HCC)    Chronic pain syndrome    Cancer associated pain    Debility    Normocytic anemia 04/26/2020   CKD (chronic kidney disease) stage 4, GFR 15-29 ml/min ([place]) 04/26/2020   Wound of right groin 04/26/2020   Gastric ulcer without hemorrhage or perforation 04/26/2020   Malnutrition of moderate degree 04/26/2020   Acute deep vein thrombosis (DVT) of femoral vein ([place]) 12/05/2019   Iron deficiency anemia 11/10/2019   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([place]) 11/06/2019   Sprain of anterior talofibular ligament of right ankle 11/22/2016   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   Aftercare following organ transplant 02/24/2013   Immunosuppression ([place]) 02/24/2013   Kidney transplant status 11/22/2012   HTN (hypertension) 11/22/2012   Hyperparathyroidism, 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    Allergies:  Allergies  Allergen Reactions   Oxycodone-Acetaminophen Itching, Nausea And Vomiting, Nausea Only, Rash and Other (See Comments)    Headaches, also    Piperacillin-Tazobactam In Dex     Other Reaction(s): Other (See Comments)  Thrombocytopenia  Thrombocytopenia   Zosyn [Piperacillin Sod-Tazobactam So] Other (See Comments)    Thrombocytopenia   Hydromorphone Nausea And Vomiting, Rash and Other (See Comments)   Amlodipine Other (See Comments) and Swelling    Ankle swelling   Codeine     vomiting   Medications:  Current Outpatient Medications:    acetaminophen (TYLENOL) 500 MG tablet, Take 2 tablets (1,000 mg total) by mouth 3 (three) times daily. (Patient taking differently: Take 1,000 mg by mouth 3 (three) times daily as needed for moderate pain.), Disp: 30 tablet, Rfl: 0   carvedilol (COREG) 12.5 MG tablet, Take 1 tablet (12.5 mg total) by mouth 2 (two) times daily with a meal. (Patient not  taking: Reported on 05/08/2022), Disp: 60 tablet, Rfl: 0   hydrALAZINE (APRESOLINE) 25 MG tablet, Take by mouth., Disp: , Rfl:    pantoprazole (PROTONIX) 40 MG tablet, TAKE 1 TABLET (40 MG TOTAL) BY MOUTH 2 (TWO) TIMES DAILY BEFORE A MEAL. (Patient taking differently: Take 40 mg by mouth 2 (two) times daily.), Disp: 60 tablet, Rfl: 0   predniSONE (DELTASONE) 5 MG tablet, Take 5 mg by mouth daily with breakfast., Disp: , Rfl:    sodium bicarbonate 650 MG tablet, Take 1 tablet (650 mg total) by mouth 3 (three) times daily. (Patient not taking: Reported on 05/08/2022), Disp: 90 tablet, Rfl: 0  Observations/Objective: Patient is well-developed, well-nourished in no acute distress.  Resting comfortably  at home.  Head is normocephalic, atraumatic.  No labored breathing.  Speech is clear and coherent with logical content.  Patient is alert and oriented at baseline.    Assessment and Plan: 1. Cellulitis, unspecified cellulitis site  We discussed that the best course of care for her is to reach out to Ms. Dixon's office and receive follow-up care through infectious disease.  Follow Up  Instructions: I discussed the assessment and treatment plan with the patient. The patient was provided an opportunity to ask questions and all were answered. The patient agreed with the plan and demonstrated an understanding of the instructions.  A copy of instructions were sent to the patient via MyChart unless otherwise noted below.    The patient was advised to call back or seek an in-person evaluation if the symptoms worsen or if the condition fails to improve as anticipated.  Time:  I spent 5 minutes with the patient via telehealth technology discussing the above problems/concerns.    Carvel Getting, NP

## 2022-05-16 NOTE — Telephone Encounter (Signed)
Received voicemail from patient reporting the same symptoms. Provider aware.   Beryle Flock, RN

## 2022-05-16 NOTE — Telephone Encounter (Signed)
Received voicemail from patient reporting she woke up this morning with cellulitis symptoms in right leg. Endorses redness and pain, the redness starts about 6 inches above her ankle. Would like to know if she needs to come in for an appointment, would prefer virtual since right leg is the leg she uses for driving. Will route to provider.   Beryle Flock, RN

## 2022-05-19 NOTE — Progress Notes (Unsigned)
Patient ID: Christina Mack, female    DOB: Jun 12, 1965, 57 y.o.   MRN: [id number]   Subjective:    No chief complaint on file.   Virtual Visit via Telephone/Video Note   I connected with Christina Mack on 05/19/2022 at 1:32 PM by telephone and verified that I am speaking with the correct person using two identifiers.   I discussed the limitations, risks, security and privacy concerns of performing an evaluation and management service by telephone and the availability of in person appointments. I also discussed with the patient that there may be a patient responsible charge related to this service. The patient expressed understanding and agreed to proceed.  Location:  Patient: Home Provider: [facility]  HPI:  Christina Mack is a 57 y.o. female with osteomyelitis of the right pelvic region and thigh, wound of right groin and lymphedema of the right lower extremity who was last seen through telehealth visit by [name], NP with discontinuation of IV antibiotics for osteomyelitis who contacted the office via [place] on 05/16/22 with concern for cellulitis of the right lower extremity and was placed on 500 mg Cefadroxil daily for 10 days. Telehealth visit today to follow up on cellulitis.    Allergies  Allergen Reactions   Oxycodone-Acetaminophen Itching, Nausea And Vomiting, Nausea Only, Rash and Other (See Comments)    Headaches, also    Piperacillin-Tazobactam In Dex     Other Reaction(s): Other (See Comments)  Thrombocytopenia  Thrombocytopenia   Zosyn [Piperacillin Sod-Tazobactam So] Other (See Comments)    Thrombocytopenia   Hydromorphone Nausea And Vomiting, Rash and Other (See Comments)   Amlodipine Other (See Comments) and Swelling    Ankle swelling   Codeine     vomiting      Outpatient Medications Prior to Visit  Medication Sig Dispense Refill   acetaminophen (TYLENOL) 500 MG tablet Take 2 tablets (1,000 mg total) by mouth 3 (three) times daily. (Patient taking  differently: Take 1,000 mg by mouth 3 (three) times daily as needed for moderate pain.) 30 tablet 0   carvedilol (COREG) 12.5 MG tablet Take 1 tablet (12.5 mg total) by mouth 2 (two) times daily with a meal. (Patient not taking: Reported on 05/08/2022) 60 tablet 0   cefadroxil (DURICEF) 500 MG capsule Take 1 capsule (500 mg total) by mouth daily. 10 capsule 0   hydrALAZINE (APRESOLINE) 25 MG tablet Take by mouth.     pantoprazole (PROTONIX) 40 MG tablet TAKE 1 TABLET (40 MG TOTAL) BY MOUTH 2 (TWO) TIMES DAILY BEFORE A MEAL. (Patient taking differently: Take 40 mg by mouth 2 (two) times daily.) 60 tablet 0   predniSONE (DELTASONE) 5 MG tablet Take 5 mg by mouth daily with breakfast.     sodium bicarbonate 650 MG tablet Take 1 tablet (650 mg total) by mouth 3 (three) times daily. (Patient not taking: Reported on 05/08/2022) 90 tablet 0   No facility-administered medications prior to visit.     Past Medical History:  Diagnosis Date   Complication of anesthesia    nausea and vomiting   DVT (deep venous thrombosis) (HCC)    History of osteomyelitis    Kidney transplant recipient    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[facility])      Past Surgical History:  Procedure Laterality Date   APPLICATION OF WOUND VAC Right 01/22/2022   Procedure: APPLICATION OF WOUND VAC;  Surgeon: Leandrew Koyanagi, MD;  Location: [place];  Service: Orthopedics;  Laterality: Right;   BIOPSY  04/24/2020   Procedure: BIOPSY;  Surgeon: Doran Stabler, MD;  Location: [place];  Service: Gastroenterology;;   ESOPHAGOGASTRODUODENOSCOPY (EGD) WITH PROPOFOL N/A 04/24/2020   Procedure: ESOPHAGOGASTRODUODENOSCOPY (EGD) WITH PROPOFOL;  Surgeon: Doran Stabler, MD;  Location: [place];  Service: Gastroenterology;  Laterality: N/A;   IR FLUORO GUIDE CV LINE RIGHT  03/24/2022   IR PERC TUN PERIT CATH WO PORT S&I /IMAG  03/24/2022   IR REMOVAL TUN CV CATH W/O FL  05/08/2022   IR US GUIDE BX ASP/DRAIN  04/30/2020   IR US GUIDE VASC ACCESS RIGHT   03/24/2022   KIDNEY TRANSPLANT  2001   KIDNEY TRANSPLANT  2014   LEEP     ORIF TIBIA PLATEAU Right 01/22/2022   Procedure: OPEN REDUCTION INTERNAL FIXATION (ORIF) TIBIAL PLATEAU;  Surgeon: Meredith Pel, MD;  Location: [place];  Service: Orthopedics;  Laterality: Right;   TOTAL ABDOMINAL HYSTERECTOMY     ovaries left in situ   TOTAL HIP ARTHROPLASTY Right 01/22/2022   Procedure: TOTAL HIP ARTHROPLASTY ANTERIOR APPROACH;  Surgeon: Leandrew Koyanagi, MD;  Location: [place];  Service: Orthopedics;  Laterality: Right;      Review of Systems    Objective:    There were no vitals taken for this visit. Nursing note and vital signs reviewed.  Physical Exam      05/08/2022    9:44 AM 04/11/2022    8:37 AM 11/10/2021    9:32 AM 08/09/2021    3:11 PM 06/14/2021    3:54 PM  Depression screen PHQ 2/9  Decreased Interest 0 0 0 0 0  Down, Depressed, Hopeless 0 0 0 0 0  PHQ - 2 Score 0 0 0 0 0       Assessment & Plan:    Patient Active Problem List   Diagnosis Date Noted   Lymphedema of right lower extremity 05/08/2022   Pain 03/23/2022   Renal failure 03/22/2022   Need for emotional support 03/22/2022   Goals of care, counseling/discussion 03/22/2022   Weakness 03/22/2022   Chronic osteomyelitis ([place]) 03/22/2022   Palliative care encounter 03/22/2022   Bacteremia due to Pseudomonas 03/22/2022   Pressure injury of skin 03/20/2022   AKI (acute kidney injury) ([place]) 03/19/2022   Closed fracture of lateral portion of right tibial plateau 02/03/2022   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   Pancytopenia ([place]) 01/19/2022   At risk for adverse drug interaction 10/13/2021   Long term (current) use of antibiotics 09/16/2021   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 ([place]) 05/17/2021   History of healed fragility fracture 05/17/2021   Leg weakness, bilateral 09/15/2020   Screening for malignant neoplasm of skin 05/31/2020   Immunocompromised state due to drug therapy  ([place]) 05/18/2020   Chronic kidney disease (CKD), stage IV (severe) (HCC)    Chronic pain syndrome    Cancer associated pain    Debility    Normocytic anemia 04/26/2020   CKD (chronic kidney disease) stage 4, GFR 15-29 ml/min ([place]) 04/26/2020   Wound of right groin 04/26/2020   Gastric ulcer without hemorrhage or perforation 04/26/2020   Malnutrition of moderate degree 04/26/2020   Acute deep vein thrombosis (DVT) of femoral vein ([place]) 12/05/2019   Iron deficiency anemia 11/10/2019   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([place]) 11/06/2019   Sprain of anterior talofibular ligament of right ankle 11/22/2016   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 Lung maintain her predniSONE, acetaminophen, pantoprazole, carvedilol, sodium bicarbonate, hydrALAZINE, and cefadroxil.   No orders of the defined types were placed in this encounter.    I discussed the assessment and treatment plan with the patient. The patient was provided an opportunity to ask questions and all were answered. The patient agreed with the plan and demonstrated an understanding of the instructions.   The patient was advised to call back or seek an in-person evaluation if the symptoms worsen or if the condition fails to improve as anticipated.   I provided   minutes of non-face-to-face time during this encounter.  Follow-up: No follow-ups on file.   Terri Piedra, MSN, FNP-C Nurse Practitioner [facility] for Infectious Disease [place] number: [phone]

## 2022-05-23 ENCOUNTER — Other Ambulatory Visit: Payer: Self-pay

## 2022-05-23 ENCOUNTER — Encounter: Payer: Self-pay | Admitting: Family

## 2022-05-23 ENCOUNTER — Encounter (HOSPITAL_COMMUNITY): Payer: Self-pay

## 2022-05-23 ENCOUNTER — Telehealth (INDEPENDENT_AMBULATORY_CARE_PROVIDER_SITE_OTHER): Admitting: Family

## 2022-05-23 DIAGNOSIS — I890 Lymphedema, not elsewhere classified: Secondary | ICD-10-CM

## 2022-05-23 NOTE — Assessment & Plan Note (Signed)
Christina Mack continues to have lymphedema complicated by cellulitis and improved with course of Cefadroxil with no adverse side effects. Complete course of Cefadroxil and continue with lymphedema treatments including elevating leg and compression sock as able. Follow up if symptoms worsen or do not improve.

## 2022-05-23 NOTE — Patient Instructions (Signed)
Nice to see you.  Complete your course of Cefadroxil.  Continue lymphedema treatment with leg elevation and compression socks.   Follow up with Colletta [place] as needed and for continued care.  Have a great day and stay safe!

## 2022-05-26 ENCOUNTER — Encounter (HOSPITAL_COMMUNITY)
Admission: RE | Admit: 2022-05-26 | Discharge: 2022-05-26 | Disposition: A | Discharge status: Home / Self Care | Source: Ambulatory Visit | Attending: Nephrology | Admitting: Nephrology

## 2022-05-26 ENCOUNTER — Other Ambulatory Visit: Payer: Self-pay

## 2022-05-26 ENCOUNTER — Encounter (HOSPITAL_COMMUNITY): Payer: Self-pay | Admitting: Emergency Medicine

## 2022-05-26 ENCOUNTER — Inpatient Hospital Stay (HOSPITAL_COMMUNITY)
Admission: EM | Admit: 2022-05-26 | Discharge: 2022-05-29 | DRG: 378 | Disposition: A | Discharge status: Home / Self Care | Payer: Medicare Other | Attending: Internal Medicine | Admitting: Internal Medicine

## 2022-05-26 VITALS — BP 167/65 | HR 91 | Temp 98.2°F | Resp 18

## 2022-05-26 DIAGNOSIS — Y830 Surgical operation with transplant of whole organ as the cause of abnormal reaction of the patient, or of later complication, without mention of misadventure at the time of the procedure: Secondary | ICD-10-CM | POA: Diagnosis present

## 2022-05-26 DIAGNOSIS — K5730 Diverticulosis of large intestine without perforation or abscess without bleeding: Secondary | ICD-10-CM | POA: Diagnosis present

## 2022-05-26 DIAGNOSIS — K2971 Gastritis, unspecified, with bleeding: Secondary | ICD-10-CM | POA: Diagnosis present

## 2022-05-26 DIAGNOSIS — D62 Acute posthemorrhagic anemia: Secondary | ICD-10-CM | POA: Diagnosis not present

## 2022-05-26 DIAGNOSIS — I82409 Acute embolism and thrombosis of unspecified deep veins of unspecified lower extremity: Secondary | ICD-10-CM | POA: Diagnosis not present

## 2022-05-26 DIAGNOSIS — I10 Essential (primary) hypertension: Secondary | ICD-10-CM | POA: Diagnosis present

## 2022-05-26 DIAGNOSIS — Z90710 Acquired absence of both cervix and uterus: Secondary | ICD-10-CM | POA: Diagnosis not present

## 2022-05-26 DIAGNOSIS — Z880 Allergy status to penicillin: Secondary | ICD-10-CM | POA: Diagnosis not present

## 2022-05-26 DIAGNOSIS — R194 Change in bowel habit: Secondary | ICD-10-CM | POA: Diagnosis not present

## 2022-05-26 DIAGNOSIS — D509 Iron deficiency anemia, unspecified: Secondary | ICD-10-CM | POA: Diagnosis present

## 2022-05-26 DIAGNOSIS — I129 Hypertensive chronic kidney disease with stage 1 through stage 4 chronic kidney disease, or unspecified chronic kidney disease: Secondary | ICD-10-CM | POA: Diagnosis present

## 2022-05-26 DIAGNOSIS — L03115 Cellulitis of right lower limb: Secondary | ICD-10-CM | POA: Diagnosis present

## 2022-05-26 DIAGNOSIS — K3189 Other diseases of stomach and duodenum: Secondary | ICD-10-CM | POA: Diagnosis not present

## 2022-05-26 DIAGNOSIS — K259 Gastric ulcer, unspecified as acute or chronic, without hemorrhage or perforation: Secondary | ICD-10-CM

## 2022-05-26 DIAGNOSIS — N186 End stage renal disease: Secondary | ICD-10-CM

## 2022-05-26 DIAGNOSIS — Z96641 Presence of right artificial hip joint: Secondary | ICD-10-CM | POA: Diagnosis present

## 2022-05-26 DIAGNOSIS — I890 Lymphedema, not elsewhere classified: Secondary | ICD-10-CM | POA: Diagnosis present

## 2022-05-26 DIAGNOSIS — K2991 Gastroduodenitis, unspecified, with bleeding: Principal | ICD-10-CM | POA: Diagnosis present

## 2022-05-26 DIAGNOSIS — K648 Other hemorrhoids: Secondary | ICD-10-CM | POA: Diagnosis present

## 2022-05-26 DIAGNOSIS — D649 Anemia, unspecified: Secondary | ICD-10-CM | POA: Diagnosis present

## 2022-05-26 DIAGNOSIS — Z8249 Family history of ischemic heart disease and other diseases of the circulatory system: Secondary | ICD-10-CM

## 2022-05-26 DIAGNOSIS — K2970 Gastritis, unspecified, without bleeding: Secondary | ICD-10-CM | POA: Diagnosis not present

## 2022-05-26 DIAGNOSIS — K922 Gastrointestinal hemorrhage, unspecified: Secondary | ICD-10-CM | POA: Diagnosis not present

## 2022-05-26 DIAGNOSIS — K2980 Duodenitis without bleeding: Secondary | ICD-10-CM | POA: Diagnosis not present

## 2022-05-26 DIAGNOSIS — Z885 Allergy status to narcotic agent status: Secondary | ICD-10-CM | POA: Diagnosis not present

## 2022-05-26 DIAGNOSIS — N184 Chronic kidney disease, stage 4 (severe): Secondary | ICD-10-CM

## 2022-05-26 DIAGNOSIS — Z888 Allergy status to other drugs, medicaments and biological substances status: Secondary | ICD-10-CM | POA: Diagnosis not present

## 2022-05-26 DIAGNOSIS — Z8544 Personal history of malignant neoplasm of other female genital organs: Secondary | ICD-10-CM | POA: Diagnosis not present

## 2022-05-26 DIAGNOSIS — Z8711 Personal history of peptic ulcer disease: Secondary | ICD-10-CM | POA: Diagnosis not present

## 2022-05-26 DIAGNOSIS — G894 Chronic pain syndrome: Secondary | ICD-10-CM | POA: Diagnosis present

## 2022-05-26 DIAGNOSIS — D631 Anemia in chronic kidney disease: Secondary | ICD-10-CM | POA: Diagnosis present

## 2022-05-26 DIAGNOSIS — K644 Residual hemorrhoidal skin tags: Secondary | ICD-10-CM | POA: Diagnosis present

## 2022-05-26 DIAGNOSIS — Z8619 Personal history of other infectious and parasitic diseases: Secondary | ICD-10-CM

## 2022-05-26 DIAGNOSIS — T8612 Kidney transplant failure: Secondary | ICD-10-CM | POA: Diagnosis present

## 2022-05-26 DIAGNOSIS — Z881 Allergy status to other antibiotic agents status: Secondary | ICD-10-CM

## 2022-05-26 DIAGNOSIS — Z79899 Other long term (current) drug therapy: Secondary | ICD-10-CM

## 2022-05-26 DIAGNOSIS — Z7952 Long term (current) use of systemic steroids: Secondary | ICD-10-CM

## 2022-05-26 DIAGNOSIS — R195 Other fecal abnormalities: Secondary | ICD-10-CM | POA: Diagnosis not present

## 2022-05-26 DIAGNOSIS — Z940 Kidney transplant status: Secondary | ICD-10-CM

## 2022-05-26 HISTORY — DX: Kidney transplant failure: T86.12

## 2022-05-26 LAB — CBC WITH DIFFERENTIAL/PLATELET
Abs Immature Granulocytes: 0.09 10*3/uL — ABNORMAL HIGH (ref 0.00–0.07)
Basophils Absolute: 0 10*3/uL (ref 0.0–0.1)
Basophils Relative: 0 %
Eosinophils Absolute: 0.1 10*3/uL (ref 0.0–0.5)
Eosinophils Relative: 1 %
HCT: 16.1 % — ABNORMAL LOW (ref 36.0–46.0)
Hemoglobin: 4.8 g/dL — CL (ref 12.0–15.0)
Immature Granulocytes: 1 %
Lymphocytes Relative: 15 %
Lymphs Abs: 1.3 10*3/uL (ref 0.7–4.0)
MCH: 30.2 pg (ref 26.0–34.0)
MCHC: 29.8 g/dL — ABNORMAL LOW (ref 30.0–36.0)
MCV: 101.3 fL — ABNORMAL HIGH (ref 80.0–100.0)
Monocytes Absolute: 0.4 10*3/uL (ref 0.1–1.0)
Monocytes Relative: 5 %
Neutro Abs: 6.7 10*3/uL (ref 1.7–7.7)
Neutrophils Relative %: 78 %
Platelets: 160 10*3/uL (ref 150–400)
RBC: 1.59 MIL/uL — ABNORMAL LOW (ref 3.87–5.11)
RDW: 16.7 % — ABNORMAL HIGH (ref 11.5–15.5)
WBC: 8.6 10*3/uL (ref 4.0–10.5)
nRBC: 0 % (ref 0.0–0.2)

## 2022-05-26 LAB — RENAL FUNCTION PANEL
Albumin: 2.5 g/dL — ABNORMAL LOW (ref 3.5–5.0)
Anion gap: 15 (ref 5–15)
BUN: 86 mg/dL — ABNORMAL HIGH (ref 6–20)
CO2: 10 mmol/L — ABNORMAL LOW (ref 22–32)
Calcium: 8.1 mg/dL — ABNORMAL LOW (ref 8.9–10.3)
Chloride: 109 mmol/L (ref 98–111)
Creatinine, Ser: 7.77 mg/dL — ABNORMAL HIGH (ref 0.44–1.00)
GFR, Estimated: 6 mL/min — ABNORMAL LOW (ref >60)
Glucose, Bld: 118 mg/dL — ABNORMAL HIGH (ref 70–99)
Phosphorus: 8.8 mg/dL — ABNORMAL HIGH (ref 2.5–4.6)
Potassium: 4.4 mmol/L (ref 3.5–5.1)
Sodium: 134 mmol/L — ABNORMAL LOW (ref 135–145)

## 2022-05-26 LAB — IRON AND TIBC
Iron: 39 ug/dL (ref 28–170)
Iron: 31 ug/dL (ref 28–170)
Saturation Ratios: 23 % (ref 10.4–31.8)
Saturation Ratios: 19 % (ref 10.4–31.8)
TIBC: 172 ug/dL — ABNORMAL LOW (ref 250–450)
TIBC: 161 ug/dL — ABNORMAL LOW (ref 250–450)
UIBC: 133 ug/dL
UIBC: 130 ug/dL

## 2022-05-26 LAB — COMPREHENSIVE METABOLIC PANEL
ALT: 9 U/L (ref 0–44)
AST: 10 U/L — ABNORMAL LOW (ref 15–41)
Albumin: 2.6 g/dL — ABNORMAL LOW (ref 3.5–5.0)
Alkaline Phosphatase: 88 U/L (ref 38–126)
Anion gap: 14 (ref 5–15)
BUN: 86 mg/dL — ABNORMAL HIGH (ref 6–20)
CO2: 10 mmol/L — ABNORMAL LOW (ref 22–32)
Calcium: 8.2 mg/dL — ABNORMAL LOW (ref 8.9–10.3)
Chloride: 110 mmol/L (ref 98–111)
Creatinine, Ser: 7.86 mg/dL — ABNORMAL HIGH (ref 0.44–1.00)
GFR, Estimated: 6 mL/min — ABNORMAL LOW (ref >60)
Glucose, Bld: 129 mg/dL — ABNORMAL HIGH (ref 70–99)
Potassium: 4.6 mmol/L (ref 3.5–5.1)
Sodium: 134 mmol/L — ABNORMAL LOW (ref 135–145)
Total Bilirubin: 0.6 mg/dL (ref 0.3–1.2)
Total Protein: 7.7 g/dL (ref 6.5–8.1)

## 2022-05-26 LAB — FOLATE: Folate: 8.1 ng/mL (ref >5.9)

## 2022-05-26 LAB — FERRITIN
Ferritin: 1417 ng/mL — ABNORMAL HIGH (ref 11–307)
Ferritin: 1239 ng/mL — ABNORMAL HIGH (ref 11–307)

## 2022-05-26 LAB — RETICULOCYTES
Immature Retic Fract: 7.9 % (ref 2.3–15.9)
RBC.: 1.77 MIL/uL — ABNORMAL LOW (ref 3.87–5.11)
Retic Count, Absolute: 35.8 10*3/uL (ref 19.0–186.0)
Retic Ct Pct: 2 % (ref 0.4–3.1)

## 2022-05-26 LAB — VITAMIN B12: Vitamin B-12: 1085 pg/mL — ABNORMAL HIGH (ref 180–914)

## 2022-05-26 LAB — POC OCCULT BLOOD, ED: Fecal Occult Bld: POSITIVE — AB

## 2022-05-26 LAB — PREPARE RBC (CROSSMATCH)

## 2022-05-26 LAB — POCT HEMOGLOBIN-HEMACUE: Hemoglobin: 5.1 g/dL — CL (ref 12.0–15.0)

## 2022-05-26 MED ORDER — HYDRALAZINE HCL 25 MG PO TABS
25.0000 mg | ORAL_TABLET | Freq: Four times a day (QID) | ORAL | Status: DC | PRN
  Administered 2022-05-27: 25 mg via ORAL
  Filled 2022-05-26: qty 1

## 2022-05-26 MED ORDER — CARVEDILOL 12.5 MG PO TABS
12.5000 mg | ORAL_TABLET | Freq: Two times a day (BID) | ORAL | Status: DC
  Administered 2022-05-26 – 2022-05-27 (×3): 12.5 mg via ORAL
  Filled 2022-05-26 (×3): qty 1

## 2022-05-26 MED ORDER — ACETAMINOPHEN 650 MG RE SUPP: 650.0000 mg | Freq: Four times a day (QID) | RECTAL | Status: DC | PRN

## 2022-05-26 MED ORDER — AMLODIPINE BESYLATE 10 MG PO TABS
10.0000 mg | ORAL_TABLET | Freq: Once | ORAL | Status: DC
  Filled 2022-05-26: qty 1

## 2022-05-26 MED ORDER — EPOETIN ALFA-EPBX 10000 UNIT/ML IJ SOLN
20000.0000 [IU] | INTRAMUSCULAR | Status: DC
  Administered 2022-05-26: 20000 [IU] via SUBCUTANEOUS

## 2022-05-26 MED ORDER — SODIUM CHLORIDE 0.9% IV SOLUTION: Freq: Once | INTRAVENOUS | Status: AC

## 2022-05-26 MED ORDER — AMOXICILLIN-POT CLAVULANATE 500-125 MG PO TABS
1.0000 | ORAL_TABLET | Freq: Every day | ORAL | Status: DC
  Administered 2022-05-26 – 2022-05-29 (×4): 1 via ORAL
  Filled 2022-05-26 (×4): qty 1

## 2022-05-26 MED ORDER — METOPROLOL TARTRATE 5 MG/5ML IV SOLN
5.0000 mg | Freq: Four times a day (QID) | INTRAVENOUS | Status: AC | PRN
  Administered 2022-05-26 – 2022-05-27 (×2): 5 mg via INTRAVENOUS
  Filled 2022-05-26 (×2): qty 5

## 2022-05-26 MED ORDER — DOXYCYCLINE HYCLATE 100 MG PO TABS
100.0000 mg | ORAL_TABLET | Freq: Two times a day (BID) | ORAL | Status: DC
  Administered 2022-05-26 – 2022-05-29 (×6): 100 mg via ORAL
  Filled 2022-05-26 (×6): qty 1

## 2022-05-26 MED ORDER — ACETAMINOPHEN 325 MG PO TABS
650.0000 mg | ORAL_TABLET | Freq: Four times a day (QID) | ORAL | Status: DC | PRN
  Administered 2022-05-27 – 2022-05-28 (×5): 650 mg via ORAL
  Filled 2022-05-26 (×4): qty 2

## 2022-05-26 MED ORDER — EPOETIN ALFA-EPBX 10000 UNIT/ML IJ SOLN
INTRAMUSCULAR | Status: AC
  Filled 2022-05-26: qty 2

## 2022-05-26 MED ORDER — SODIUM CHLORIDE 0.9 % IV SOLN: INTRAVENOUS | Status: DC

## 2022-05-26 MED ORDER — PEG-KCL-NACL-NASULF-NA ASC-C 100 G PO SOLR
0.5000 | Freq: Once | ORAL | Status: AC
  Administered 2022-05-26: 100 g via ORAL

## 2022-05-26 MED ORDER — PEG-KCL-NACL-NASULF-NA ASC-C 100 G PO SOLR
0.5000 | Freq: Once | ORAL | Status: AC
  Administered 2022-05-26: 100 g via ORAL
  Filled 2022-05-26: qty 1

## 2022-05-26 MED ORDER — PEG-KCL-NACL-NASULF-NA ASC-C 100 G PO SOLR: 1.0000 | Freq: Once | ORAL | Status: DC

## 2022-05-26 MED ORDER — PANTOPRAZOLE SODIUM 40 MG PO TBEC
40.0000 mg | DELAYED_RELEASE_TABLET | Freq: Two times a day (BID) | ORAL | Status: DC
  Administered 2022-05-26 – 2022-05-29 (×7): 40 mg via ORAL
  Filled 2022-05-26 (×7): qty 1

## 2022-05-26 MED ORDER — PREDNISONE 5 MG PO TABS
5.0000 mg | ORAL_TABLET | Freq: Every day | ORAL | Status: DC
  Administered 2022-05-27 – 2022-05-29 (×3): 5 mg via ORAL
  Filled 2022-05-26 (×4): qty 1

## 2022-05-26 MED ORDER — ACETAMINOPHEN 500 MG PO TABS: 1000.0000 mg | ORAL_TABLET | Freq: Three times a day (TID) | ORAL | Status: DC | PRN

## 2022-05-26 NOTE — Consult Note (Signed)
Consultation  Referring Provider:  Dr. Kathrynn Humble    Primary Care Physician:  System, Provider Not In Primary Gastroenterologist: Althia Forts     Reason for Consultation: Anemia             HPI:   Christina Mack is a 57 y.o. female with past medical history as listed below including DVT, ESRD status post renal transplant and additional renal transplant the last in 2014 and vulvar cancer, who presented to the ED today with a complaint of low hemoglobin.    At time of presentation patient describes that she had a GI "bug" approximately 2 weeks ago and had multiple bowel movements with some blood in her stool as well as blood when wiping.  Apparently has history of ongoing hemorrhoids which usually ooze from the wound but however these have since resolved.  She was called by her PCP after labs were drawn to obtain a transfusion.    Today, the patient tells me that she has a long history of being anemic over the past 20+ years.  Apparently this was never really worked up by a GI physician.  Explains that she has just had her hemoglobin checked periodically.  They have given her transfusions as needed.  Over the past 2 to 3 months she has required at least 3 transfusions.  Typically her nephrologist at [facility] is the one who sets these up.  She did have a prior EGD as below, but has never had a colonoscopy.  She is maintained on Pantoprazole 40 mg twice a day ever since her EGD in 2022.  No breakthrough reflux symptoms.      Does tell me she has noticed a change in her bowel habits, describing that she seems to eat and then go to the bathroom but will have a soft solid stool.  This is new for her over the past 2 months.     Describes previously they wanted to do a colonoscopy but were waiting until her open wound healed near her vulva, apparently has undergone hyperbaric treatment for this and is due for follow-up with them.  Apparently still a small part of her bone is sticking through.    Denies fever,  chills, weight loss, nausea or vomiting.  ED course: Hemoglobin 4.8, MCV 101.3, sodium 134, BUN 86, fecal occult positive  GI history: 04/24/2020 EGD esophageal plaques consistent with candidiasis, nonbleeding gastric ulcer, normal examined duodenum, 2 biopsies were obtained in the gastric body in the gastric antrum 04/22/2020 patient consulted by our service for nausea and vomiting  Past Medical History:  Diagnosis Date   Complication of anesthesia    nausea and vomiting   DVT (deep venous thrombosis) ([place])    History of osteomyelitis    Kidney transplant failure    Kidney transplant recipient    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[facility])     Past Surgical History:  Procedure Laterality Date   APPLICATION OF WOUND VAC Right 01/22/2022   Procedure: APPLICATION OF WOUND VAC;  Surgeon: Leandrew Koyanagi, MD;  Location: [place];  Service: Orthopedics;  Laterality: Right;   BIOPSY  04/24/2020   Procedure: BIOPSY;  Surgeon: Doran Stabler, MD;  Location: [place];  Service: Gastroenterology;;   ESOPHAGOGASTRODUODENOSCOPY (EGD) WITH PROPOFOL N/A 04/24/2020   Procedure: ESOPHAGOGASTRODUODENOSCOPY (EGD) WITH PROPOFOL;  Surgeon: Doran Stabler, MD;  Location: [place];  Service: Gastroenterology;  Laterality: N/A;   IR FLUORO GUIDE CV LINE RIGHT  03/24/2022   IR PERC  TUN PERIT CATH WO PORT S&I /IMAG  03/24/2022   IR REMOVAL TUN CV CATH W/O FL  05/08/2022   IR US GUIDE BX ASP/DRAIN  04/30/2020   IR US GUIDE VASC ACCESS RIGHT  03/24/2022   KIDNEY TRANSPLANT  2001   KIDNEY TRANSPLANT  2014   LEEP     ORIF TIBIA PLATEAU Right 01/22/2022   Procedure: OPEN REDUCTION INTERNAL FIXATION (ORIF) TIBIAL PLATEAU;  Surgeon: Meredith Pel, MD;  Location: [place];  Service: Orthopedics;  Laterality: Right;   TOTAL ABDOMINAL HYSTERECTOMY     ovaries left in situ   TOTAL HIP ARTHROPLASTY Right 01/22/2022   Procedure: TOTAL HIP ARTHROPLASTY ANTERIOR APPROACH;  Surgeon: Leandrew Koyanagi, MD;  Location: [place];  Service:  Orthopedics;  Laterality: Right;    Family History  Problem Relation Age of Onset   Cerebral aneurysm Mother    AAA (abdominal aortic aneurysm) Brother    Breast cancer Paternal Aunt    Heart attack Maternal Grandmother    Brain cancer Maternal Grandfather    Colon cancer Neg Hx    Ovarian cancer Neg Hx    Endometrial cancer Neg Hx    Pancreatic cancer Neg Hx    Prostate cancer Neg Hx     Social History   Tobacco Use   Smoking status: Never   Smokeless tobacco: Never  Vaping Use   Vaping Use: Never used  Substance Use Topics   Alcohol use: Not Currently   Drug use: Never    Prior to Admission medications   Medication Sig Start Date End Date Taking? Authorizing Provider  acetaminophen (TYLENOL) 500 MG tablet Take 2 tablets (1,000 mg total) by mouth 3 (three) times daily. Patient taking differently: Take 1,000 mg by mouth 3 (three) times daily as needed for moderate pain. 05/05/20   Gaylan Gerold, DO  carvedilol (COREG) 12.5 MG tablet Take 1 tablet (12.5 mg total) by mouth 2 (two) times daily with a meal. Patient not taking: Reported on 05/08/2022 03/25/22   Dessa Phi, DO  cefadroxil (DURICEF) 500 MG capsule Take 1 capsule (500 mg total) by mouth daily. 05/16/22   [place] Callas, NP  hydrALAZINE (APRESOLINE) 25 MG tablet Take by mouth. 05/04/22 05/04/23  [provider]  pantoprazole (PROTONIX) 40 MG tablet TAKE 1 TABLET (40 MG TOTAL) BY MOUTH 2 (TWO) TIMES DAILY BEFORE A MEAL. Patient taking differently: Take 40 mg by mouth 2 (two) times daily. 05/17/20 05/08/22  Bary Leriche, PA-C  predniSONE (DELTASONE) 5 MG tablet Take 5 mg by mouth daily with breakfast.    [provider]  sodium bicarbonate 650 MG tablet Take 1 tablet (650 mg total) by mouth 3 (three) times daily. Patient not taking: Reported on 05/08/2022 03/25/22   Dessa Phi, DO    No current facility-administered medications for this encounter.   Current Outpatient Medications  Medication Sig  Dispense Refill   acetaminophen (TYLENOL) 500 MG tablet Take 2 tablets (1,000 mg total) by mouth 3 (three) times daily. (Patient taking differently: Take 1,000 mg by mouth 3 (three) times daily as needed for moderate pain.) 30 tablet 0   carvedilol (COREG) 12.5 MG tablet Take 1 tablet (12.5 mg total) by mouth 2 (two) times daily with a meal. (Patient not taking: Reported on 05/08/2022) 60 tablet 0   cefadroxil (DURICEF) 500 MG capsule Take 1 capsule (500 mg total) by mouth daily. 10 capsule 0   hydrALAZINE (APRESOLINE) 25 MG tablet Take by mouth.  pantoprazole (PROTONIX) 40 MG tablet TAKE 1 TABLET (40 MG TOTAL) BY MOUTH 2 (TWO) TIMES DAILY BEFORE A MEAL. (Patient taking differently: Take 40 mg by mouth 2 (two) times daily.) 60 tablet 0   predniSONE (DELTASONE) 5 MG tablet Take 5 mg by mouth daily with breakfast.     sodium bicarbonate 650 MG tablet Take 1 tablet (650 mg total) by mouth 3 (three) times daily. (Patient not taking: Reported on 05/08/2022) 90 tablet 0   Facility-Administered Medications Ordered in Other Encounters  Medication Dose Route Frequency Provider Last Rate Last Admin   epoetin alfa-epbx (RETACRIT) 23557 UNIT/ML injection            epoetin alfa-epbx (RETACRIT) injection 20,000 Units  20,000 Units Subcutaneous Q14 Days Rexene Agent, MD   20,000 Units at 05/26/22 1013    Allergies as of 05/26/2022 - Review Complete 05/26/2022  Allergen Reaction Noted   Oxycodone-acetaminophen Itching, Nausea And Vomiting, Nausea Only, Rash, and Other (See Comments) 11/22/2016   Piperacillin-tazobactam in dex  05/03/2020   Zosyn [piperacillin sod-tazobactam so] Other (See Comments) 05/03/2020   Hydromorphone Nausea And Vomiting, Rash, and Other (See Comments) 11/22/2016   Amlodipine Other (See Comments) and Swelling 05/31/2020   Codeine  04/20/2020     Review of Systems:    Constitutional: No weight loss, fever or chills Skin: No rash  Cardiovascular: No chest pain  Respiratory: No  SOB  Gastrointestinal: See HPI and otherwise negative Genitourinary: No dysuria Neurological: No headache, dizziness or syncope Musculoskeletal: No new muscle or joint pain Hematologic: No bleeding  Psychiatric: No history of depression or anxiety    Physical Exam:  Vital signs in last 24 hours: Temp:  [97.8 F (36.6 C)-98.2 F (36.8 C)] 97.9 F (36.6 C) (02/23 1330) Pulse Rate:  [81-91] 87 (02/23 1330) Resp:  [18-21] 19 (02/23 1330) BP: (154-167)/(65-98) 165/95 (02/23 1330) SpO2:  [99 %-100 %] 100 % (02/23 1330) Weight:  [64.4 kg] 64.4 kg (02/23 1105)   General:   Pleasant Caucasian female appears to be in NAD, Well developed, Well nourished, alert and cooperative Head:  Normocephalic and atraumatic. Eyes:   PEERL, EOMI. No icterus. Conjunctiva pink. Ears:  Normal auditory acuity. Neck:  Supple Throat: Oral cavity and pharynx without inflammation, swelling or lesion.  Lungs: Respirations even and unlabored. Lungs clear to auscultation bilaterally.   No wheezes, crackles, or rhonchi.  Heart: Normal S1, S2. No MRG. Regular rate and rhythm. No peripheral edema, cyanosis or pallor.  Abdomen:  Soft, nondistended, nontender. No rebound or guarding. Normal bowel sounds. No appreciable masses or hepatomegaly. Rectal:  Not performed.  Msk:  Symmetrical without gross deformities. Peripheral pulses intact.  Extremities:  Without edema, no deformity or joint abnormality.  Neurologic:  Alert and  oriented x4;  grossly normal neurologically.  Skin:   Dry and intact without significant lesions or rashes. Psychiatric: Demonstrates good judgement and reason without abnormal affect or behaviors.   LAB RESULTS: Recent Labs    05/26/22 0948 05/26/22 1128  WBC  --  8.6  HGB 5.1* 4.8*  HCT  --  16.1*  PLT  --  160   BMET Recent Labs    05/26/22 0950 05/26/22 1128  NA 134* 134*  K 4.4 4.6  CL 109 110  CO2 10* 10*  GLUCOSE 118* 129*  BUN 86* 86*  CREATININE 7.77* 7.86*  CALCIUM  8.1* 8.2*   LFT Recent Labs    05/26/22 1128  PROT 7.7  ALBUMIN 2.6*  AST 10*  ALT 9  ALKPHOS 88  BILITOT 0.6     Impression / Plan:   Impression: 1.  Anemia with Hemoccult positive stool: Hemoglobin 4.8 at time of admission, currently undergoing 1 unit transfusion, will likely need at least 2 more to get above 7 and time for procedures tomorrow, uncertain etiology, last EGD in 2022 with gastritis and Candida esophagitis, no prior colonoscopy; consider GI source versus other 2.  Status post kidney transplant x 2 3.  History of vulvar cancer status posttreatment: Remains with an open wound in this area which is delayed colonoscopy in the past 4.  Change in bowel habits: Patient has been on antibiotics for a long time, given her wound which is likely contributory  Plan: 1.  Will plan for EGD and colonoscopy tomorrow with Dr. Tarri Glenn.  Did discuss risks, benefits, limitations and alternatives with the patient and she agrees to proceed. 2.  Patient will be on clear liquids diet the rest of today and n.p.o. at midnight 3.  Movi prep is ordered in split dose fashion to be started shortly 4.  Continue to monitor hemoglobin with transfusion as needed less than 7.  Hemoglobin needs to be above 7 in time for procedure tomorrow or this will be delayed. 5.  Continue other supportive measures. 6.  Agree with Pantoprazole 40 twice daily  Thank you for your kind consultation, we will continue to follow.  Lavone Nian [facility]  05/26/2022, 2:14 PM

## 2022-05-26 NOTE — Assessment & Plan Note (Signed)
Patient with chronic lymphedema right leg since her pelvic surgery. She has been unable to wear thigh-high stockings since developing celluliti distal right LE  Plan 4" elastic bandage to right leg

## 2022-05-26 NOTE — ED Notes (Signed)
Got patient on the monitor got patient some warm blankets patient is resting with call bell in reach  ?

## 2022-05-26 NOTE — Assessment & Plan Note (Signed)
Patient followed by Dr. Joelyn Oms. Creatine has been rising and she may come to dialysis. She still makes urine and shows no signs at admission of fluid overload.   Plan Close monitoring for fluid status-will hold off on diuretics at this time  F/u Bmet in AM  For any significant change in Creatinine - nephrology consult

## 2022-05-26 NOTE — H&P (Signed)
History and Physical    Christina Mack [id number] DOB: 09/13/1965 DOA: 05/26/2022  DOS: the patient was seen and examined on 05/26/2022  PCP: System, Provider Not In   Patient coming from: Home  I have personally briefly reviewed patient's old medical records in [facility]  Christina Mack, a 57 y/o with a complex medical history with vulvar cancer s/p surgery, XRT and chemo who at last gyn-onc visit in January had no recurrence. 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 She has had osteomyelitis of the pelvis and just finished a prolonged course of IV abx. She is to follow up with ortho-onc at [facility] Tuesday. 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 Joelyn Oms at [facility]. She has chronic anemia with Hgb running between 6-7. Today in clinic her Hgb was 5.1 leading to a referral to [facility] for evaluation.    ED Course: T 97.9  172/99  HR 81 RR 16. Per EDP note - patient in no distress and asymptomatic. Lab - Cr 7.86, Hgb 4.8 diff 78/15/5/1. Heme positive stool. In ED patient given 1 unit PRBC. GI consulted and has seen patient. TRH called to admit for continue mgt medical problems.    Review of Systems:  Review of Systems  Constitutional:  Negative for chills, fever and weight loss.  HENT: Negative.    Eyes: Negative.   Respiratory: Negative.    Cardiovascular: Negative.   Gastrointestinal:  Negative for blood in stool, diarrhea, heartburn, nausea and vomiting.  Genitourinary: Negative.   Musculoskeletal: Negative.   Skin:        Distal right LE with erythema, heat and tenderness.  Neurological: Negative.   Endo/Heme/Allergies: Negative.   Psychiatric/Behavioral: Negative.      Past Medical History:  Diagnosis Date   Complication of anesthesia    nausea and vomiting   DVT (deep venous thrombosis) (HCC)    History of osteomyelitis    Kidney transplant failure    Kidney transplant recipient     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[facility])     Past Surgical History:  Procedure Laterality Date   APPLICATION OF WOUND VAC Right 01/22/2022   Procedure: APPLICATION OF WOUND VAC;  Surgeon: Leandrew Koyanagi, MD;  Location: [place];  Service: Orthopedics;  Laterality: Right;   BIOPSY  04/24/2020   Procedure: BIOPSY;  Surgeon: Doran Stabler, MD;  Location: [place];  Service: Gastroenterology;;   ESOPHAGOGASTRODUODENOSCOPY (EGD) WITH PROPOFOL N/A 04/24/2020   Procedure: ESOPHAGOGASTRODUODENOSCOPY (EGD) WITH PROPOFOL;  Surgeon: Doran Stabler, MD;  Location: [place];  Service: Gastroenterology;  Laterality: N/A;   IR FLUORO GUIDE CV LINE RIGHT  03/24/2022   IR PERC TUN PERIT CATH WO PORT S&I /IMAG  03/24/2022   IR REMOVAL TUN CV CATH W/O FL  05/08/2022   IR US GUIDE BX ASP/DRAIN  04/30/2020   IR US GUIDE VASC ACCESS RIGHT  03/24/2022   KIDNEY TRANSPLANT  2001   KIDNEY TRANSPLANT  2014   LEEP     ORIF TIBIA PLATEAU Right 01/22/2022   Procedure: OPEN REDUCTION INTERNAL FIXATION (ORIF) TIBIAL PLATEAU;  Surgeon: Meredith Pel, MD;  Location: [place];  Service: Orthopedics;  Laterality: Right;   TOTAL ABDOMINAL HYSTERECTOMY     ovaries left in situ   TOTAL HIP ARTHROPLASTY Right 01/22/2022   Procedure: TOTAL HIP ARTHROPLASTY ANTERIOR APPROACH;  Surgeon: Leandrew Koyanagi, MD;  Location: [place];  Service: Orthopedics;  Laterality: Right;   Soc Hx  - 1st marriage 5 years - divorced. One son, one daughter. 2nd marriage in 6th year - 1 step-daughter. They have two grandchildren. She worked at [employer] at [employer] until retired on medical disability. Lives with spouse    reports that she has never smoked. She has never used smokeless tobacco. She reports that she does not currently use alcohol. She reports that she does not use drugs.  Allergies  Allergen Reactions   Oxycodone-Acetaminophen Itching, Nausea And Vomiting, Nausea Only, Rash and Other (See Comments)    Headaches, also    Piperacillin-Tazobactam  In Dex     Other Reaction(s): Other (See Comments)  Thrombocytopenia  Thrombocytopenia   Zosyn [Piperacillin Sod-Tazobactam So] Other (See Comments)    Thrombocytopenia   Hydromorphone Nausea And Vomiting, Rash and Other (See Comments)   Amlodipine Other (See Comments) and Swelling    Ankle swelling   Codeine     vomiting   Zyvox [Linezolid] Other (See Comments)    Decreased platelet counts    Family History  Problem Relation Age of Onset   Cerebral aneurysm Mother    AAA (abdominal aortic aneurysm) Brother    Breast cancer Paternal Aunt    Heart attack Maternal Grandmother    Brain cancer Maternal Grandfather    Colon cancer Neg Hx    Ovarian cancer Neg Hx    Endometrial cancer Neg Hx    Pancreatic cancer Neg Hx    Prostate cancer Neg Hx     Prior to Admission medications   Medication Sig Start Date End Date Taking? Authorizing Provider  acetaminophen (TYLENOL) 500 MG tablet Take 2 tablets (1,000 mg total) by mouth 3 (three) times daily. Patient taking differently: Take 1,000 mg by mouth 3 (three) times daily as needed for moderate pain. 05/05/20   Gaylan Gerold, DO  carvedilol (COREG) 12.5 MG tablet Take 1 tablet (12.5 mg total) by mouth 2 (two) times daily with a meal. Patient not taking: Reported on 05/08/2022 03/25/22   Dessa Phi, DO  cefadroxil (DURICEF) 500 MG capsule Take 1 capsule (500 mg total) by mouth daily. 05/16/22   [place] Callas, NP  hydrALAZINE (APRESOLINE) 25 MG tablet Take by mouth. 05/04/22 05/04/23  [provider]  pantoprazole (PROTONIX) 40 MG tablet TAKE 1 TABLET (40 MG TOTAL) BY MOUTH 2 (TWO) TIMES DAILY BEFORE A MEAL. Patient taking differently: Take 40 mg by mouth 2 (two) times daily. 05/17/20 05/08/22  Bary Leriche, PA-C  predniSONE (DELTASONE) 5 MG tablet Take 5 mg by mouth daily with breakfast.    [provider]  sodium bicarbonate 650 MG tablet Take 1 tablet (650 mg total) by mouth 3 (three) times daily. Patient not taking:  Reported on 05/08/2022 03/25/22   Dessa Phi, DO    Physical Exam: Vitals:   05/26/22 1400 05/26/22 1415 05/26/22 1430 05/26/22 1538  BP: (!) 163/97 (!) 172/98 (!) 172/99 (!) 184/111  Pulse: 79 81 81 83  Resp: '16 16 16 17  '$ Temp:    97.6 F (36.4 C)  TempSrc:    Oral  SpO2: 100% 100% 100% 100%  Weight:      Height:        Physical Exam Vitals and nursing note reviewed.  Constitutional:      Appearance: Normal appearance. She is normal weight.  HENT:     Head: Normocephalic and atraumatic.     Mouth/Throat:     Mouth: Mucous membranes  are moist.     Pharynx: Oropharynx is clear. No oropharyngeal exudate.  Eyes:     Extraocular Movements: Extraocular movements intact.     Conjunctiva/sclera: Conjunctivae normal.     Pupils: Pupils are equal, round, and reactive to light.  Cardiovascular:     Rate and Rhythm: Normal rate and regular rhythm.     Pulses: Normal pulses.     Heart sounds: Normal heart sounds. No murmur heard. Pulmonary:     Effort: Pulmonary effort is normal.     Breath sounds: Normal breath sounds.  Abdominal:     General: Bowel sounds are normal. There is no distension.     Palpations: Abdomen is soft.     Tenderness: There is no abdominal tenderness. There is no guarding.  Genitourinary:    Comments: Rectal exam per ED-PA - some distortion of tissue 2/2 surgery/XRT. Stool was heme positive Musculoskeletal:        General: Normal range of motion.     Cervical back: Normal range of motion and neck supple.  Skin:    Comments: Distal right LE with calore, tumor, dolore. No open lesion. Chronic lymphedema from thigh to foot   Neurological:     General: No focal deficit present.     Mental Status: She is alert and oriented to person, place, and time. Mental status is at baseline.     Cranial Nerves: No cranial nerve deficit.  Psychiatric:        Mood and Affect: Mood normal.        Behavior: Behavior normal.      Labs on Admission: I have personally  reviewed following labs and imaging studies  CBC: Recent Labs  Lab 05/26/22 0948 05/26/22 1128  WBC  --  8.6  NEUTROABS  --  6.7  HGB 5.1* 4.8*  HCT  --  16.1*  MCV  --  101.3*  PLT  --  0000000   Basic Metabolic Panel: Recent Labs  Lab 05/26/22 0950 05/26/22 1128  NA 134* 134*  K 4.4 4.6  CL 109 110  CO2 10* 10*  GLUCOSE 118* 129*  BUN 86* 86*  CREATININE 7.77* 7.86*  CALCIUM 8.1* 8.2*  PHOS 8.8*  --    GFR: Estimated Creatinine Clearance: 7.3 mL/min (A) (by C-G formula based on SCr of 7.86 mg/dL (H)). Liver Function Tests: Recent Labs  Lab 05/26/22 0950 05/26/22 1128  AST  --  10*  ALT  --  9  ALKPHOS  --  88  BILITOT  --  0.6  PROT  --  7.7  ALBUMIN 2.5* 2.6*   No results for input(s): "LIPASE", "AMYLASE" in the last 168 hours. No results for input(s): "AMMONIA" in the last 168 hours. Coagulation Profile: No results for input(s): "INR", "PROTIME" in the last 168 hours. Cardiac Enzymes: No results for input(s): "CKTOTAL", "CKMB", "CKMBINDEX", "TROPONINI" in the last 168 hours. BNP (last 3 results) No results for input(s): "PROBNP" in the last 8760 hours. HbA1C: No results for input(s): "HGBA1C" in the last 72 hours. CBG: No results for input(s): "GLUCAP" in the last 168 hours. Lipid Profile: No results for input(s): "CHOL", "HDL", "LDLCALC", "TRIG", "CHOLHDL", "LDLDIRECT" in the last 72 hours. Thyroid Function Tests: No results for input(s): "TSH", "T4TOTAL", "FREET4", "T3FREE", "THYROIDAB" in the last 72 hours. Anemia Panel: Recent Labs    05/26/22 0950  FERRITIN 1,417*  TIBC 172*  IRON 39   Urine analysis:    Component Value Date/Time   COLORURINE YELLOW 03/20/2022  [place] 03/20/2022 0132   LABSPEC 1.012 03/20/2022 0132   PHURINE 5.0 03/20/2022 0132   GLUCOSEU 50 (A) 03/20/2022 0132   HGBUR LARGE (A) 03/20/2022 0132   BILIRUBINUR NEGATIVE 03/20/2022 0132   BILIRUBINUR negative 11/02/2020 1037   KETONESUR 5 (A) 03/20/2022  0132   PROTEINUR >=300 (A) 03/20/2022 0132   UROBILINOGEN 0.2 11/02/2020 1037   NITRITE NEGATIVE 03/20/2022 0132   LEUKOCYTESUR NEGATIVE 03/20/2022 0132    Radiological Exams on Admission: I have personally reviewed images No results found.  EKG: I have personally reviewed EKG: NSR w/o acute changes  Assessment/Plan Active Problems:   Normocytic anemia   Cellulitis of right leg   CKD (chronic kidney disease) stage 4, GFR 15-29 ml/min (HCC)   Chronic pain syndrome   HTN (hypertension)   Lymphedema of right lower extremity    Assessment and Plan: * Acute blood loss anemia-resolved as of 05/26/2022 Patient with chronic iron deficiency anemia and renal failure related anemia for which she gets EPO injections. Hgb runs between 6-7. Now with Hgb 4.8. She has heme positive dark stool but no hematochezia, hemetamesis or melena, no BRBPR. In ED she has received 1u PRBCs. GI is seeing patient and plans, per patient, colonoscopy for 05/27/22.  Plan 2nd [facility]  Q8 H/H starting 3 hrs after 2nd unit of blood  Normocytic anemia Patient with chronic iron deficiency anemia related to kidney disease. She does get erythropoetin injections - last several days ago. Her baseline Hgb is between 6 and 7 g. Now with Hgb 4.8 with minimal symptoms. She is noted to have heme positive stool and dark, but not black stool. Not hematochezia, or BRBPR. She has a h/o hemorrhoids but per ED-PA exam no active hemorroids. GI consulting.  Plan Patient will be given a 2nd unit PRBCs with a goal of Hgb = 7g  Q8 hr H/H starting 3 hours after 2nd unit of blood.  Patient has had outside anemia studies per [facility]  Per Patient GI is planning to do colonoscopy 05/27/22. May add EGD at their descretion  Cellulitis of right leg Patient with recent treatment for recurrent distal right LE cellulitis. She is finishing a course of cefadroxil. The leg remains erythematous, warm to touch and tender.  Plan Augmentin 875 mg  BID plus Doxycycline 100 mg BID  Lymphedema of right lower extremity Patient with chronic lymphedema right leg since her pelvic surgery. She has been unable to wear thigh-high stockings since developing celluliti distal right LE  Plan 4" elastic bandage to right leg  HTN (hypertension) BP elevated at admission.  Plan Resume coreg 12.5 daily  Continue hydralazine prn  Chronic pain syndrome Patient is very stable and pain free at admission. She has medication intolerance.  Plan APAP as needed  CKD (chronic kidney disease) stage 4, GFR 15-29 ml/min (HCC) Patient followed by Dr. Joelyn Oms. Creatine has been rising and she may come to dialysis. She still makes urine and shows no signs at admission of fluid overload.   Plan Close monitoring for fluid status-will hold off on diuretics at this time  F/u Bmet in AM  For any significant change in Creatinine - nephrology consult       DVT prophylaxis:  TED hose left leg, ACE wrap right leg Code Status: Full Code Family Communication: spouse present. Answered all questions. Understands Dx and Tx plan  Disposition Plan: home when stable  Consults called: GI -  MS. Lemmons, PA-C for Dr. Tarri Glenn Admission status:  Inpatient, Med-Surg   Adella Hare, MD [facility] 05/26/2022, 4:24 PM

## 2022-05-26 NOTE — H&P (View-Only) (Signed)
Consultation  Referring Provider:  Dr. Kathrynn Humble    Primary Care Physician:  System, Provider Not In Primary Gastroenterologist: Althia Forts     Reason for Consultation: Anemia             HPI:   Christina Mack is a 57 y.o. female with past medical history as listed below including DVT, ESRD status post renal transplant and additional renal transplant the last in 2014 and vulvar cancer, who presented to the ED today with a complaint of low hemoglobin.    At time of presentation patient describes that she had a GI "bug" approximately 2 weeks ago and had multiple bowel movements with some blood in her stool as well as blood when wiping.  Apparently has history of ongoing hemorrhoids which usually ooze from the wound but however these have since resolved.  She was called by her PCP after labs were drawn to obtain a transfusion.    Today, the patient tells me that she has a long history of being anemic over the past 20+ years.  Apparently this was never really worked up by a GI physician.  Explains that she has just had her hemoglobin checked periodically.  They have given her transfusions as needed.  Over the past 2 to 3 months she has required at least 3 transfusions.  Typically her nephrologist at [facility] is the one who sets these up.  She did have a prior EGD as below, but has never had a colonoscopy.  She is maintained on Pantoprazole 40 mg twice a day ever since her EGD in 2022.  No breakthrough reflux symptoms.      Does tell me she has noticed a change in her bowel habits, describing that she seems to eat and then go to the bathroom but will have a soft solid stool.  This is new for her over the past 2 months.     Describes previously they wanted to do a colonoscopy but were waiting until her open wound healed near her vulva, apparently has undergone hyperbaric treatment for this and is due for follow-up with them.  Apparently still a small part of her bone is sticking through.    Denies fever,  chills, weight loss, nausea or vomiting.  ED course: Hemoglobin 4.8, MCV 101.3, sodium 134, BUN 86, fecal occult positive  GI history: 04/24/2020 EGD esophageal plaques consistent with candidiasis, nonbleeding gastric ulcer, normal examined duodenum, 2 biopsies were obtained in the gastric body in the gastric antrum 04/22/2020 patient consulted by our service for nausea and vomiting  Past Medical History:  Diagnosis Date   Complication of anesthesia    nausea and vomiting   DVT (deep venous thrombosis) ([place])    History of osteomyelitis    Kidney transplant failure    Kidney transplant recipient    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[facility])     Past Surgical History:  Procedure Laterality Date   APPLICATION OF WOUND VAC Right 01/22/2022   Procedure: APPLICATION OF WOUND VAC;  Surgeon: Leandrew Koyanagi, MD;  Location: [place];  Service: Orthopedics;  Laterality: Right;   BIOPSY  04/24/2020   Procedure: BIOPSY;  Surgeon: Doran Stabler, MD;  Location: [place];  Service: Gastroenterology;;   ESOPHAGOGASTRODUODENOSCOPY (EGD) WITH PROPOFOL N/A 04/24/2020   Procedure: ESOPHAGOGASTRODUODENOSCOPY (EGD) WITH PROPOFOL;  Surgeon: Doran Stabler, MD;  Location: [place];  Service: Gastroenterology;  Laterality: N/A;   IR FLUORO GUIDE CV LINE RIGHT  03/24/2022   IR PERC  TUN PERIT CATH WO PORT S&I /IMAG  03/24/2022   IR REMOVAL TUN CV CATH W/O FL  05/08/2022   IR US GUIDE BX ASP/DRAIN  04/30/2020   IR US GUIDE VASC ACCESS RIGHT  03/24/2022   KIDNEY TRANSPLANT  2001   KIDNEY TRANSPLANT  2014   LEEP     ORIF TIBIA PLATEAU Right 01/22/2022   Procedure: OPEN REDUCTION INTERNAL FIXATION (ORIF) TIBIAL PLATEAU;  Surgeon: Meredith Pel, MD;  Location: [place];  Service: Orthopedics;  Laterality: Right;   TOTAL ABDOMINAL HYSTERECTOMY     ovaries left in situ   TOTAL HIP ARTHROPLASTY Right 01/22/2022   Procedure: TOTAL HIP ARTHROPLASTY ANTERIOR APPROACH;  Surgeon: Leandrew Koyanagi, MD;  Location: [place];  Service:  Orthopedics;  Laterality: Right;    Family History  Problem Relation Age of Onset   Cerebral aneurysm Mother    AAA (abdominal aortic aneurysm) Brother    Breast cancer Paternal Aunt    Heart attack Maternal Grandmother    Brain cancer Maternal Grandfather    Colon cancer Neg Hx    Ovarian cancer Neg Hx    Endometrial cancer Neg Hx    Pancreatic cancer Neg Hx    Prostate cancer Neg Hx     Social History   Tobacco Use   Smoking status: Never   Smokeless tobacco: Never  Vaping Use   Vaping Use: Never used  Substance Use Topics   Alcohol use: Not Currently   Drug use: Never    Prior to Admission medications   Medication Sig Start Date End Date Taking? Authorizing Provider  acetaminophen (TYLENOL) 500 MG tablet Take 2 tablets (1,000 mg total) by mouth 3 (three) times daily. Patient taking differently: Take 1,000 mg by mouth 3 (three) times daily as needed for moderate pain. 05/05/20   Gaylan Gerold, DO  carvedilol (COREG) 12.5 MG tablet Take 1 tablet (12.5 mg total) by mouth 2 (two) times daily with a meal. Patient not taking: Reported on 05/08/2022 03/25/22   Dessa Phi, DO  cefadroxil (DURICEF) 500 MG capsule Take 1 capsule (500 mg total) by mouth daily. 05/16/22   [place] Callas, NP  hydrALAZINE (APRESOLINE) 25 MG tablet Take by mouth. 05/04/22 05/04/23  [provider]  pantoprazole (PROTONIX) 40 MG tablet TAKE 1 TABLET (40 MG TOTAL) BY MOUTH 2 (TWO) TIMES DAILY BEFORE A MEAL. Patient taking differently: Take 40 mg by mouth 2 (two) times daily. 05/17/20 05/08/22  Bary Leriche, PA-C  predniSONE (DELTASONE) 5 MG tablet Take 5 mg by mouth daily with breakfast.    [provider]  sodium bicarbonate 650 MG tablet Take 1 tablet (650 mg total) by mouth 3 (three) times daily. Patient not taking: Reported on 05/08/2022 03/25/22   Dessa Phi, DO    No current facility-administered medications for this encounter.   Current Outpatient Medications  Medication Sig  Dispense Refill   acetaminophen (TYLENOL) 500 MG tablet Take 2 tablets (1,000 mg total) by mouth 3 (three) times daily. (Patient taking differently: Take 1,000 mg by mouth 3 (three) times daily as needed for moderate pain.) 30 tablet 0   carvedilol (COREG) 12.5 MG tablet Take 1 tablet (12.5 mg total) by mouth 2 (two) times daily with a meal. (Patient not taking: Reported on 05/08/2022) 60 tablet 0   cefadroxil (DURICEF) 500 MG capsule Take 1 capsule (500 mg total) by mouth daily. 10 capsule 0   hydrALAZINE (APRESOLINE) 25 MG tablet Take by mouth.  pantoprazole (PROTONIX) 40 MG tablet TAKE 1 TABLET (40 MG TOTAL) BY MOUTH 2 (TWO) TIMES DAILY BEFORE A MEAL. (Patient taking differently: Take 40 mg by mouth 2 (two) times daily.) 60 tablet 0   predniSONE (DELTASONE) 5 MG tablet Take 5 mg by mouth daily with breakfast.     sodium bicarbonate 650 MG tablet Take 1 tablet (650 mg total) by mouth 3 (three) times daily. (Patient not taking: Reported on 05/08/2022) 90 tablet 0   Facility-Administered Medications Ordered in Other Encounters  Medication Dose Route Frequency Provider Last Rate Last Admin   epoetin alfa-epbx (RETACRIT) 16109 UNIT/ML injection            epoetin alfa-epbx (RETACRIT) injection 20,000 Units  20,000 Units Subcutaneous Q14 Days Rexene Agent, MD   20,000 Units at 05/26/22 1013    Allergies as of 05/26/2022 - Review Complete 05/26/2022  Allergen Reaction Noted   Oxycodone-acetaminophen Itching, Nausea And Vomiting, Nausea Only, Rash, and Other (See Comments) 11/22/2016   Piperacillin-tazobactam in dex  05/03/2020   Zosyn [piperacillin sod-tazobactam so] Other (See Comments) 05/03/2020   Hydromorphone Nausea And Vomiting, Rash, and Other (See Comments) 11/22/2016   Amlodipine Other (See Comments) and Swelling 05/31/2020   Codeine  04/20/2020     Review of Systems:    Constitutional: No weight loss, fever or chills Skin: No rash  Cardiovascular: No chest pain  Respiratory: No  SOB  Gastrointestinal: See HPI and otherwise negative Genitourinary: No dysuria Neurological: No headache, dizziness or syncope Musculoskeletal: No new muscle or joint pain Hematologic: No bleeding  Psychiatric: No history of depression or anxiety    Physical Exam:  Vital signs in last 24 hours: Temp:  [97.8 F (36.6 C)-98.2 F (36.8 C)] 97.9 F (36.6 C) (02/23 1330) Pulse Rate:  [81-91] 87 (02/23 1330) Resp:  [18-21] 19 (02/23 1330) BP: (154-167)/(65-98) 165/95 (02/23 1330) SpO2:  [99 %-100 %] 100 % (02/23 1330) Weight:  [64.4 kg] 64.4 kg (02/23 1105)   General:   Pleasant Caucasian female appears to be in NAD, Well developed, Well nourished, alert and cooperative Head:  Normocephalic and atraumatic. Eyes:   PEERL, EOMI. No icterus. Conjunctiva pink. Ears:  Normal auditory acuity. Neck:  Supple Throat: Oral cavity and pharynx without inflammation, swelling or lesion.  Lungs: Respirations even and unlabored. Lungs clear to auscultation bilaterally.   No wheezes, crackles, or rhonchi.  Heart: Normal S1, S2. No MRG. Regular rate and rhythm. No peripheral edema, cyanosis or pallor.  Abdomen:  Soft, nondistended, nontender. No rebound or guarding. Normal bowel sounds. No appreciable masses or hepatomegaly. Rectal:  Not performed.  Msk:  Symmetrical without gross deformities. Peripheral pulses intact.  Extremities:  Without edema, no deformity or joint abnormality.  Neurologic:  Alert and  oriented x4;  grossly normal neurologically.  Skin:   Dry and intact without significant lesions or rashes. Psychiatric: Demonstrates good judgement and reason without abnormal affect or behaviors.   LAB RESULTS: Recent Labs    05/26/22 0948 05/26/22 1128  WBC  --  8.6  HGB 5.1* 4.8*  HCT  --  16.1*  PLT  --  160   BMET Recent Labs    05/26/22 0950 05/26/22 1128  NA 134* 134*  K 4.4 4.6  CL 109 110  CO2 10* 10*  GLUCOSE 118* 129*  BUN 86* 86*  CREATININE 7.77* 7.86*  CALCIUM  8.1* 8.2*   LFT Recent Labs    05/26/22 1128  PROT 7.7  ALBUMIN 2.6*  AST 10*  ALT 9  ALKPHOS 88  BILITOT 0.6     Impression / Plan:   Impression: 1.  Anemia with Hemoccult positive stool: Hemoglobin 4.8 at time of admission, currently undergoing 1 unit transfusion, will likely need at least 2 more to get above 7 and time for procedures tomorrow, uncertain etiology, last EGD in 2022 with gastritis and Candida esophagitis, no prior colonoscopy; consider GI source versus other 2.  Status post kidney transplant x 2 3.  History of vulvar cancer status posttreatment: Remains with an open wound in this area which is delayed colonoscopy in the past 4.  Change in bowel habits: Patient has been on antibiotics for a long time, given her wound which is likely contributory  Plan: 1.  Will plan for EGD and colonoscopy tomorrow with Dr. Tarri Glenn.  Did discuss risks, benefits, limitations and alternatives with the patient and she agrees to proceed. 2.  Patient will be on clear liquids diet the rest of today and n.p.o. at midnight 3.  Movi prep is ordered in split dose fashion to be started shortly 4.  Continue to monitor hemoglobin with transfusion as needed less than 7.  Hemoglobin needs to be above 7 in time for procedure tomorrow or this will be delayed. 5.  Continue other supportive measures. 6.  Agree with Pantoprazole 40 twice daily  Thank you for your kind consultation, we will continue to follow.  Lavone Nian [facility]  05/26/2022, 2:14 PM

## 2022-05-26 NOTE — Subjective & Objective (Signed)
Mrs. Falconer, a 57 y/o with a complex medical history with vulvar cancer s/p surgery, XRT and chemo who at last gyn-onc visit in January had no recurrence. 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 She has had osteomyelitis of the pelvis and just finished a prolonged course of IV abx. She is to follow up with ortho-onc at [facility] Tuesday. 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 Joelyn Oms at [facility]. She has chronic anemia with Hgb running between 6-7. Today in clinic her Hgb was 5.1 leading to a referral to [facility] for evaluation.

## 2022-05-26 NOTE — Progress Notes (Addendum)
Patient's HGB 5.1 today.  Patient stated she has some red blood in stools, tired but not SOB.  Waiting for [facility] to call back.  Spoke with Museum/gallery conservator at [employer] re;HGB 5.1. Per [employer] / Dr. Joelyn Oms patient needed to get blood in ED and we were okay to give retacrit as ordered.  Took Patient to ED via wheelchair and checked her in - ED aware of HGB 5.1.

## 2022-05-26 NOTE — Assessment & Plan Note (Signed)
Patient is very stable and pain free at admission. She has medication intolerance.  Plan APAP as needed

## 2022-05-26 NOTE — ED Triage Notes (Signed)
Pt. Stated, I went today to get my shot for my hemoglobin and it was 5.1 , told to come here. Last week I had a virus with diarrhea so that was part of it.

## 2022-05-26 NOTE — Assessment & Plan Note (Signed)
Patient with chronic iron deficiency anemia related to kidney disease. She does get erythropoetin injections - last several days ago. Her baseline Hgb is between 6 and 7 g. Now with Hgb 4.8 with minimal symptoms. She is noted to have heme positive stool and dark, but not black stool. Not hematochezia, or BRBPR. She has a h/o hemorrhoids but per ED-PA exam no active hemorroids. GI consulting.  Plan Patient will be given a 2nd unit PRBCs with a goal of Hgb = 7g  Q8 hr H/H starting 3 hours after 2nd unit of blood.  Patient has had outside anemia studies per [facility]  Per Patient GI is planning to do colonoscopy 05/27/22. May add EGD at their descretion

## 2022-05-26 NOTE — ED Provider Notes (Signed)
[facility] Provider Note   CSN: [id number] Arrival date & time: 05/26/22  1020     History Vulvar CA, Anemia Chief Complaint  Patient presents with   Abnormal Lab   Fatigue    Christina Mack is a 57 y.o. female.  57 year old female with a past medical history of anemia, vulvar cancer presents to the ED sent in by PCP due to low hemoglobin.  Patient reports she had a GI bug approximately 2 weeks ago, had multiple bowel movements with some blood in her stool, there was blood when she was wiping.  She also has a history of ongoing hemorrhoids which usually ooze from the wound but however these have now resolved.  She was called by her PCP in order to be seen in the emergency department and obtain transfusion.  She has not had any dizziness, not lightheadedness, no chest pain or shortness of breath.  Patient is on no blood thinners currently.  She denies any abdominal pain as well.   The history is provided by the patient and medical records.  Abnormal Lab      Home Medications Prior to Admission medications   Medication Sig Start Date End Date Taking? Authorizing Provider  acetaminophen (TYLENOL) 500 MG tablet Take 2 tablets (1,000 mg total) by mouth 3 (three) times daily. Patient taking differently: Take 1,000 mg by mouth 3 (three) times daily as needed for moderate pain. 05/05/20   Gaylan Gerold, DO  carvedilol (COREG) 12.5 MG tablet Take 1 tablet (12.5 mg total) by mouth 2 (two) times daily with a meal. Patient not taking: Reported on 05/08/2022 03/25/22   Dessa Phi, DO  cefadroxil (DURICEF) 500 MG capsule Take 1 capsule (500 mg total) by mouth daily. 05/16/22   [place] Callas, NP  hydrALAZINE (APRESOLINE) 25 MG tablet Take by mouth. 05/04/22 05/04/23  [provider]  pantoprazole (PROTONIX) 40 MG tablet TAKE 1 TABLET (40 MG TOTAL) BY MOUTH 2 (TWO) TIMES DAILY BEFORE A MEAL. Patient taking differently: Take 40 mg by mouth 2  (two) times daily. 05/17/20 05/08/22  Bary Leriche, PA-C  predniSONE (DELTASONE) 5 MG tablet Take 5 mg by mouth daily with breakfast.    [provider]  sodium bicarbonate 650 MG tablet Take 1 tablet (650 mg total) by mouth 3 (three) times daily. Patient not taking: Reported on 05/08/2022 03/25/22   Dessa Phi, DO      Allergies    Oxycodone-acetaminophen, Piperacillin-tazobactam in dex, Zosyn [piperacillin sod-tazobactam so], Hydromorphone, Amlodipine, and Codeine    Review of Systems   Review of Systems  Constitutional:  Negative for chills and fever.  HENT:  Negative for sore throat.   Respiratory:  Negative for shortness of breath.   Cardiovascular:  Negative for chest pain.  Gastrointestinal:  Negative for abdominal pain, nausea and vomiting.  Genitourinary:  Negative for flank pain.  Musculoskeletal:  Negative for back pain.  Neurological:  Negative for light-headedness.  All other systems reviewed and are negative.   Physical Exam Updated Vital Signs BP (!) 165/95   Pulse 87   Temp 97.9 F (36.6 C) (Oral) Comment: 15 minute  Resp 19   Ht '5\' 5"'$  (1.651 m)   Wt 64.4 kg   SpO2 100%   BMI 23.63 kg/m  Physical Exam Vitals and nursing note reviewed. Exam conducted with a chaperone present.  Constitutional:      General: She is not in acute distress.    Appearance:  She is well-developed.  HENT:     Head: Normocephalic and atraumatic.     Mouth/Throat:     Pharynx: No oropharyngeal exudate.  Eyes:     Pupils: Pupils are equal, round, and reactive to light.  Cardiovascular:     Rate and Rhythm: Regular rhythm.     Heart sounds: Normal heart sounds.  Pulmonary:     Effort: Pulmonary effort is normal. No respiratory distress.     Breath sounds: Normal breath sounds.  Abdominal:     General: Bowel sounds are normal. There is no distension.     Palpations: Abdomen is soft.     Tenderness: There is no abdominal tenderness.  Genitourinary:    Rectum: External  hemorrhoid present.     Comments: Anatomy is different from normal due to radiation.  No gross blood noted. Musculoskeletal:        General: No tenderness or deformity.     Cervical back: Normal range of motion.     Right lower leg: No edema.     Left lower leg: No edema.  Skin:    General: Skin is warm and dry.  Neurological:     Mental Status: She is alert and oriented to person, place, and time.     ED Results / Procedures / Treatments   Labs (all labs ordered are listed, but only abnormal results are displayed) Labs Reviewed  CBC WITH DIFFERENTIAL/PLATELET - Abnormal; Notable for the following components:      Result Value   RBC 1.59 (*)    Hemoglobin 4.8 (*)    HCT 16.1 (*)    MCV 101.3 (*)    MCHC 29.8 (*)    RDW 16.7 (*)    Abs Immature Granulocytes 0.09 (*)    All other components within normal limits  COMPREHENSIVE METABOLIC PANEL - Abnormal; Notable for the following components:   Sodium 134 (*)    CO2 10 (*)    Glucose, Bld 129 (*)    BUN 86 (*)    Creatinine, Ser 7.86 (*)    Calcium 8.2 (*)    Albumin 2.6 (*)    AST 10 (*)    GFR, Estimated 6 (*)    All other components within normal limits  POC OCCULT BLOOD, ED - Abnormal; Notable for the following components:   Fecal Occult Bld POSITIVE (*)    All other components within normal limits  VITAMIN B12  FOLATE  IRON AND TIBC  FERRITIN  RETICULOCYTES  TYPE AND SCREEN  PREPARE RBC (CROSSMATCH)    EKG EKG Interpretation  Date/Time:  Friday May 26 2022 12:03:22 EST Ventricular Rate:  76 PR Interval:  132 QRS Duration: 93 QT Interval:  396 QTC Calculation: 446 R Axis:   57 Text Interpretation: Sinus rhythm RSR' in V1 or V2, probably normal variant Left ventricular hypertrophy No acute changes No significant change since last tracing Confirmed by Varney Biles [phone]) on 05/26/2022 12:54:00 PM  Radiology No results found.  Procedures .Critical Care  Performed by: Janeece Fitting,  PA-C Authorized by: Janeece Fitting, PA-C   Critical care provider statement:    Critical care time (minutes):  40   Critical care start time:  05/26/2022 12:50 PM   Critical care end time:  05/26/2022 12:10 PM   Critical care was necessary to treat or prevent imminent or life-threatening deterioration of the following conditions:  Circulatory failure   Critical care was time spent personally by me on the following activities:  Discussions  with consultants     Medications Ordered in ED Medications  0.9 %  sodium chloride infusion (Manually program via Guardrails IV Fluids) (0 mLs Intravenous Stopped 05/26/22 1325)    ED Course/ Medical Decision Making/ A&P Clinical Course as of 05/26/22 1352  Fri May 26, 2022  1234 Fecal Occult Blood, POC(!): POSITIVE [JS]  1257 Hemoglobin(!!): 4.8 Underlying anemia with bleeding source [JS]    Clinical Course User Index [JS] Janeece Fitting, PA-C                             Medical Decision Making Amount and/or Complexity of Data Reviewed Labs: ordered. Decision-making details documented in ED Course.  Risk Prescription drug management.   This patient presents to the ED for concern of low hemoglobin, this involves a number of treatment options, and is a complaint that carries with it a high risk of complications and morbidity.  The differential diagnosis includes acute on chronic anemia, CKD versus hemorrhoids.   Co morbidities: Discussed in HPI   Brief History:  Patient with a prior history of anemia, vulvar cancer followed at [facility] presents to the ED with a chief complaint of low hemoglobin, prior history of transfusions.  She does endorse fatigue.  However no other symptoms voiced.  EMR reviewed including pt PMHx, past surgical history and past visits to ER.   See HPI for more details   Lab Tests:  I ordered and independently interpreted labs.  The pertinent results include:    Labs notable for CBC with no leukocytosis, hemoglobin is  4.8, even worsened since her last lab from PCP, suspect source likely continues to be from her rectum.  Hemoccult was positive.  CMP with no electrolyte derangement.  Creatinine level continues to be at her baseline with an elevated BUN.  Imaging Studies:  No imaging studies ordered for this patient   Cardiac Monitoring:  The patient was maintained on a cardiac monitor.  I personally viewed and interpreted the cardiac monitored which showed an underlying rhythm of: NSR 76 EKG non-ischemic  Medicines ordered:  N/A  Consults:  I requested consultation with [facility] APP GI on call,  and discussed lab and imaging findings as well as pertinent plan - they will consult on patient at this time.  Reevaluation:  After the interventions noted above I re-evaluated patient and found that they have :stayed the same   Social Determinants of Health:  The patient's social determinants of health were a factor in the care of this patient  Problem List / ED Course:  Patient with a past medical history of vulvar cancer, anemia presents to the ED with fatigue, called her PCP due to her hemoglobin being at 5 now hemoglobin repeat today and her CBC with a hemoglobin of 4.8.  No leukocytosis.  CMP remarkable for elevated creatinine which is at her baseline and an elevated BUN but she is alert and oriented x 4.  Hemoccult performed by me with nurse technician at the bedside is positive.  Does have a prior history of hemorrhoids, also has a prior history of GI intervention and takes pantoprazole daily for gastric ulcers, she has followed with them in the past but has not done so previously.  She is followed closely at [facility] due to ongoing vulvar cancer effects, but is currently not doing radiation.  She is hemodynamically stable.  Does appear pale on exam, was transfuse 1 unit, however due to hemoglobin continued to  drop since yesterday I do suspect the patient will need admission at this time into the hospital.   She does not endorse any abdominal pain, no vomiting, no melena that she visualized in her stool however she did have diarrhea for 3 weeks.  Patient is overall hemodynamically stable, afebrile for admission. I consulted this case with gastroenterology on-call [facility] APP who reports they will consult on patient while in the hospital.  Call placed for hospitalist service.   Dispostion:  After consideration of the diagnostic results and the patients response to treatment, I feel that the patent would benefit from mission for further evaluation of ongoing drop in her hemoglobin likely GI source. Spoke to Dr. Crissie Sickles who will admit patient for further management, appreciate his assistance.    Portions of this note were generated with Lobbyist. Dictation errors may occur despite best attempts at proofreading.   Final Clinical Impression(s) / ED Diagnoses Final diagnoses:  Low hemoglobin    Rx / DC Orders ED Discharge Orders     None         Janeece Fitting, PA-C 05/26/22 [place], MD 05/27/22 [place], Ankit, MD 05/27/22 1012

## 2022-05-26 NOTE — ED Notes (Signed)
ED TO INPATIENT HANDOFF REPORT  ED Nurse Name and Phone #: Ivin Poot Name/Age/Gender Christina Mack 57 y.o. female Room/Bed: 031C/031C  Code Status   Code Status: Full Code  Home/SNF/Other Home Patient oriented to: self, place, time, and situation Is this baseline? Yes   Triage Complete: Triage complete  Chief Complaint Acute blood loss anemia [D62]  Triage Note Pt. Stated, I went today to get my shot for my hemoglobin and it was 5.1 , told to come here. Last week I had a virus with diarrhea so that was part of it.   Allergies Allergies  Allergen Reactions   Oxycodone-Acetaminophen Itching, Nausea And Vomiting, Nausea Only, Rash and Other (See Comments)    Headaches, also    Piperacillin-Tazobactam In Dex     Other Reaction(s): Other (See Comments)  Thrombocytopenia  Thrombocytopenia   Zosyn [Piperacillin Sod-Tazobactam So] Other (See Comments)    Thrombocytopenia   Hydromorphone Nausea And Vomiting, Rash and Other (See Comments)   Amlodipine Other (See Comments) and Swelling    Ankle swelling   Codeine     vomiting   Zyvox [Linezolid] Other (See Comments)    Decreased platelet counts    Level of Care/Admitting Diagnosis ED Disposition     ED Disposition  Admit   Condition  --   [facility]: [facility] [[id number]]  Level of Care: Med-Surg [16]  May admit patient to [facility] or [facility] if equivalent level of care is available:: No  Covid Evaluation: Asymptomatic - no recent exposure (last 10 days) testing not required  Diagnosis: Acute blood loss anemia [id number]  Admitting Physician: Neena Rhymes [[id number]]  Attending Physician: Neena Rhymes [id number]  Certification:: I certify this patient will need inpatient services for at least 2 midnights  Estimated Length of Stay: 3          B Medical/Surgery History Past Medical History:  Diagnosis Date   Complication of anesthesia    nausea and vomiting    DVT (deep venous thrombosis) ([place])    History of osteomyelitis    Kidney transplant failure    Kidney transplant recipient    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[facility])    Past Surgical History:  Procedure Laterality Date   APPLICATION OF WOUND VAC Right 01/22/2022   Procedure: APPLICATION OF WOUND VAC;  Surgeon: Leandrew Koyanagi, MD;  Location: [facility];  Service: Orthopedics;  Laterality: Right;   BIOPSY  04/24/2020   Procedure: BIOPSY;  Surgeon: Doran Stabler, MD;  Location: [facility];  Service: Gastroenterology;;   ESOPHAGOGASTRODUODENOSCOPY (EGD) WITH PROPOFOL N/A 04/24/2020   Procedure: ESOPHAGOGASTRODUODENOSCOPY (EGD) WITH PROPOFOL;  Surgeon: Doran Stabler, MD;  Location: [facility];  Service: Gastroenterology;  Laterality: N/A;   IR FLUORO GUIDE CV LINE RIGHT  03/24/2022   IR PERC TUN PERIT CATH WO PORT S&I /IMAG  03/24/2022   IR REMOVAL TUN CV CATH W/O FL  05/08/2022   IR US GUIDE BX ASP/DRAIN  04/30/2020   IR US GUIDE VASC ACCESS RIGHT  03/24/2022   KIDNEY TRANSPLANT  2001   KIDNEY TRANSPLANT  2014   LEEP     ORIF TIBIA PLATEAU Right 01/22/2022   Procedure: OPEN REDUCTION INTERNAL FIXATION (ORIF) TIBIAL PLATEAU;  Surgeon: Meredith Pel, MD;  Location: [facility];  Service: Orthopedics;  Laterality: Right;   TOTAL ABDOMINAL HYSTERECTOMY     ovaries left in situ   TOTAL HIP ARTHROPLASTY Right 01/22/2022  Procedure: TOTAL HIP ARTHROPLASTY ANTERIOR APPROACH;  Surgeon: Leandrew Koyanagi, MD;  Location: [facility];  Service: Orthopedics;  Laterality: Right;     A IV Location/Drains/Wounds Patient Lines/Drains/Airways Status     Active Line/Drains/Airways     Name Placement date Placement time Site Days   Peripheral IV 05/26/22 18 G Left Antecubital 05/26/22  1128  Antecubital  less than 1   Negative Pressure Wound Therapy Hip Right 01/22/22  0952  --  124   Wound / Incision (Open or Dehisced) 05/05/20 Other (Comment) Labia Anterior;Right Right labia and groin area, radiation burns from previous  cancer treatment. 05/05/20  1700  Labia  751   Wound / Incision (Open or Dehisced) 03/22/22 (ITD) Intertriginous Dermatitis Coccyx Upper;Medial bleeding 03/22/22  0800  Coccyx  65            Intake/Output Last 24 hours  Intake/Output Summary (Last 24 hours) at 05/26/2022 1808 Last data filed at 05/26/2022 1707 Gross per 24 hour  Intake 327.83 ml  Output --  Net 327.83 ml    Labs/Imaging Results for orders placed or performed during the hospital encounter of 05/26/22 (from the past 48 hour(s))  CBC with Differential     Status: Abnormal   Collection Time: 05/26/22 11:28 AM  Result Value Ref Range   WBC 8.6 4.0 - 10.5 K/uL   RBC 1.59 (L) 3.87 - 5.11 MIL/uL   Hemoglobin 4.8 (LL) 12.0 - 15.0 g/dL    Comment: REPEATED TO VERIFY THIS CRITICAL RESULT HAS VERIFIED AND BEEN CALLED TO B. Lesleyanne Politte, RN BY DAISY BLU ON 02 23 2024 AT 1255, AND HAS BEEN READ BACK.     HCT 16.1 (L) 36.0 - 46.0 %   MCV 101.3 (H) 80.0 - 100.0 fL   MCH 30.2 26.0 - 34.0 pg   MCHC 29.8 (L) 30.0 - 36.0 g/dL   RDW 16.7 (H) 11.5 - 15.5 %   Platelets 160 150 - 400 K/uL   nRBC 0.0 0.0 - 0.2 %   Neutrophils Relative % 78 %   Neutro Abs 6.7 1.7 - 7.7 K/uL   Lymphocytes Relative 15 %   Lymphs Abs 1.3 0.7 - 4.0 K/uL   Monocytes Relative 5 %   Monocytes Absolute 0.4 0.1 - 1.0 K/uL   Eosinophils Relative 1 %   Eosinophils Absolute 0.1 0.0 - 0.5 K/uL   Basophils Relative 0 %   Basophils Absolute 0.0 0.0 - 0.1 K/uL   Immature Granulocytes 1 %   Abs Immature Granulocytes 0.09 (H) 0.00 - 0.07 K/uL    Comment: Performed at [facility] [address], [place], [place] [address]  Comprehensive metabolic panel     Status: Abnormal   Collection Time: 05/26/22 11:28 AM  Result Value Ref Range   Sodium 134 (L) 135 - 145 mmol/L   Potassium 4.6 3.5 - 5.1 mmol/L   Chloride 110 98 - 111 mmol/L   CO2 10 (L) 22 - 32 mmol/L   Glucose, Bld 129 (H) 70 - 99 mg/dL    Comment: Glucose reference range applies only to samples  taken after fasting for at least 8 hours.   BUN 86 (H) 6 - 20 mg/dL   Creatinine, Ser 7.86 (H) 0.44 - 1.00 mg/dL   Calcium 8.2 (L) 8.9 - 10.3 mg/dL   Total Protein 7.7 6.5 - 8.1 g/dL   Albumin 2.6 (L) 3.5 - 5.0 g/dL   AST 10 (L) 15 - 41 U/L   ALT 9  0 - 44 U/L   Alkaline Phosphatase 88 38 - 126 U/L   Total Bilirubin 0.6 0.3 - 1.2 mg/dL   GFR, Estimated 6 (L) >60 mL/min    Comment: (NOTE) Calculated using the CKD-EPI Creatinine Equation (2021)    Anion gap 14 5 - 15    Comment: Performed at [facility] [address], [place], [place] [address]  Type and screen [place]     Status: None (Preliminary result)   Collection Time: 05/26/22 11:28 AM  Result Value Ref Range   ABO/RH(D) O POS    Antibody Screen NEG    Sample Expiration 05/29/2022,2359    Unit Number [id number]    Blood Component Type RED CELLS,LR    Unit division 00    Status of Unit ISSUED    Transfusion Status OK TO TRANSFUSE    Crossmatch Result Compatible    Unit Number [id number]    Blood Component Type RED CELLS,LR    Unit division 00    Status of Unit ISSUED    Transfusion Status OK TO TRANSFUSE    Crossmatch Result      Compatible Performed at [facility], [place] [address] [place], [place] [address]   POC occult blood, ED     Status: Abnormal   Collection Time: 05/26/22 11:46 AM  Result Value Ref Range   Fecal Occult Bld POSITIVE (A) NEGATIVE  Prepare RBC (crossmatch)     Status: None   Collection Time: 05/26/22  1:00 PM  Result Value Ref Range   Order Confirmation      ORDER PROCESSED BY BLOOD BANK Performed at [facility], [place] [address], [place], [place] [address]   Vitamin B12     Status: Abnormal   Collection Time: 05/26/22  4:13 PM  Result Value Ref Range   Vitamin B-12 1,085 (H) 180 - 914 pg/mL    Comment: (NOTE) This assay is not validated for testing neonatal or myeloproliferative syndrome specimens for Vitamin B12 levels. Performed at [facility], [place] [address] [place], [place] [address]   Folate     Status: None   Collection Time: 05/26/22  4:13 PM  Result Value Ref Range   Folate 8.1 >5.9 ng/mL    Comment: Performed at [facility] [address] [place], [place] [address]  Iron and TIBC     Status: Abnormal   Collection Time: 05/26/22  4:13 PM  Result Value Ref Range   Iron 31 28 - 170 ug/dL   TIBC 161 (L) 250 - 450 ug/dL   Saturation Ratios 19 10.4 - 31.8 %   UIBC 130 ug/dL    Comment: Performed at [facility], [place] [address], [place], [place] [address]  Ferritin     Status: Abnormal   Collection Time: 05/26/22  4:13 PM  Result Value Ref Range   Ferritin 1,239 (H) 11 - 307 ng/mL    Comment: Performed at [facility], [place] [address], [place], [place] [address]  Reticulocytes     Status: Abnormal   Collection Time: 05/26/22  4:13 PM  Result Value Ref Range   Retic Ct Pct 2.0 0.4 - 3.1 %   RBC. 1.77 (L) 3.87 - 5.11 MIL/uL   Retic Count, Absolute 35.8 19.0 - 186.0 K/uL   Immature Retic Fract 7.9 2.3 - 15.9 %    Comment: Performed at [facility] [address],  [place], [place] [address]  Prepare RBC (crossmatch)     Status: None   Collection Time: 05/26/22  4:24 PM  Result Value Ref Range   Order Confirmation      ORDERS RECEIVED TO CROSSMATCH Performed at [facility], [place] [address], [place], [place] [address]    No results found.  Pending Labs Unresulted Labs (From admission, onward)     Start     Ordered   05/27/22 0500  CBC  Tomorrow morning,   R        05/26/22 1510   05/27/22 0500  Comprehensive metabolic panel  Tomorrow morning,   R        05/26/22 1510            Vitals/Pain Today's Vitals   05/26/22 1646 05/26/22 1650 05/26/22 1705 05/26/22 1800  BP: (!) 181/105 (!) 185/101 (!) 189/110 (!) 176/99  Pulse: 84 79 80 78  Resp: '17 17 17 19  '$ Temp: (!) 97.5 F (36.4 C) 97.6 F (36.4 C) 97.6 F (36.4 C) 97.8 F (36.6 C)  TempSrc:  Oral Oral  Oral  SpO2: 100% 100% 100% 100%  Weight:      Height:      PainSc:        Isolation Precautions No active isolations  Medications Medications  hydrALAZINE (APRESOLINE) tablet 25 mg (has no administration in time range)  predniSONE (DELTASONE) tablet 5 mg (has no administration in time range)  pantoprazole (PROTONIX) EC tablet 40 mg (40 mg Oral Given 05/26/22 1614)  peg 3350 powder (MOVIPREP) kit 100 g (100 g Oral Given 05/26/22 1614)    And  peg 3350 powder (MOVIPREP) kit 100 g (has no administration in time range)  0.9 %  sodium chloride infusion (has no administration in time range)  acetaminophen (TYLENOL) tablet 650 mg (has no administration in time range)    Or  acetaminophen (TYLENOL) suppository 650 mg (has no administration in time range)  amoxicillin-clavulanate (AUGMENTIN) 500-125 MG per tablet 1 tablet (1 tablet Oral Given 05/26/22 1757)  doxycycline (VIBRA-TABS) tablet 100 mg (has no administration in time range)  0.9 %  sodium chloride infusion (Manually program via Guardrails IV Fluids) (0 mLs Intravenous Stopped 05/26/22 1325)  0.9 %  sodium chloride infusion (Manually program via Guardrails IV Fluids) (0 mLs Intravenous Stopped 05/26/22 1707)    Mobility walks with device     Focused Assessments Gastrointestinal/skin   R Recommendations: See Admitting Provider Note  Report given to:   Additional Notes:

## 2022-05-26 NOTE — Assessment & Plan Note (Signed)
BP elevated at admission.  Plan Resume coreg 12.5 daily  Continue hydralazine prn

## 2022-05-26 NOTE — Assessment & Plan Note (Signed)
Patient with chronic iron deficiency anemia and renal failure related anemia for which she gets EPO injections. Hgb runs between 6-7. Now with Hgb 4.8. She has heme positive dark stool but no hematochezia, hemetamesis or melena, no BRBPR. In ED she has received 1u PRBCs. GI is seeing patient and plans, per patient, colonoscopy for 05/27/22.  Plan 2nd [place]  Q8 H/H starting 3 hrs after 2nd unit of blood

## 2022-05-26 NOTE — ED Notes (Signed)
[place] PA-C notified of critical hemoglobin

## 2022-05-26 NOTE — Assessment & Plan Note (Signed)
Patient with recent treatment for recurrent distal right LE cellulitis. She is finishing a course of cefadroxil. The leg remains erythematous, warm to touch and tender.  Plan Augmentin 875 mg BID plus Doxycycline 100 mg BID

## 2022-05-27 ENCOUNTER — Encounter

## 2022-05-27 ENCOUNTER — Inpatient Hospital Stay (HOSPITAL_COMMUNITY): Payer: Medicare Other | Admitting: Anesthesiology

## 2022-05-27 ENCOUNTER — Encounter (HOSPITAL_COMMUNITY)
Admission: EM | Disposition: A | Discharge status: Home / Self Care | Payer: Self-pay | Source: Home / Self Care | Attending: Internal Medicine

## 2022-05-27 ENCOUNTER — Encounter (HOSPITAL_COMMUNITY): Payer: Self-pay | Admitting: Internal Medicine

## 2022-05-27 DIAGNOSIS — K5730 Diverticulosis of large intestine without perforation or abscess without bleeding: Secondary | ICD-10-CM

## 2022-05-27 DIAGNOSIS — K3189 Other diseases of stomach and duodenum: Secondary | ICD-10-CM

## 2022-05-27 DIAGNOSIS — K648 Other hemorrhoids: Secondary | ICD-10-CM

## 2022-05-27 DIAGNOSIS — R195 Other fecal abnormalities: Secondary | ICD-10-CM

## 2022-05-27 DIAGNOSIS — I10 Essential (primary) hypertension: Secondary | ICD-10-CM

## 2022-05-27 DIAGNOSIS — K2970 Gastritis, unspecified, without bleeding: Secondary | ICD-10-CM

## 2022-05-27 DIAGNOSIS — K2980 Duodenitis without bleeding: Secondary | ICD-10-CM

## 2022-05-27 DIAGNOSIS — K922 Gastrointestinal hemorrhage, unspecified: Secondary | ICD-10-CM

## 2022-05-27 HISTORY — PX: COLONOSCOPY WITH PROPOFOL: SHX5780

## 2022-05-27 HISTORY — PX: BIOPSY: SHX5522

## 2022-05-27 HISTORY — PX: ESOPHAGOGASTRODUODENOSCOPY (EGD) WITH PROPOFOL: SHX5813

## 2022-05-27 LAB — COMPREHENSIVE METABOLIC PANEL
ALT: 9 U/L (ref 0–44)
AST: 9 U/L — ABNORMAL LOW (ref 15–41)
Albumin: 2.4 g/dL — ABNORMAL LOW (ref 3.5–5.0)
Alkaline Phosphatase: 70 U/L (ref 38–126)
Anion gap: 16 — ABNORMAL HIGH (ref 5–15)
BUN: 82 mg/dL — ABNORMAL HIGH (ref 6–20)
CO2: 9 mmol/L — ABNORMAL LOW (ref 22–32)
Calcium: 8 mg/dL — ABNORMAL LOW (ref 8.9–10.3)
Chloride: 110 mmol/L (ref 98–111)
Creatinine, Ser: 7.59 mg/dL — ABNORMAL HIGH (ref 0.44–1.00)
GFR, Estimated: 6 mL/min — ABNORMAL LOW (ref >60)
Glucose, Bld: 80 mg/dL (ref 70–99)
Potassium: 4.7 mmol/L (ref 3.5–5.1)
Sodium: 135 mmol/L (ref 135–145)
Total Bilirubin: 0.7 mg/dL (ref 0.3–1.2)
Total Protein: 7.1 g/dL (ref 6.5–8.1)

## 2022-05-27 LAB — BPAM RBC
Blood Product Expiration Date: 202403262359
Blood Product Expiration Date: 202403272359
ISSUE DATE / TIME: 202402231251
ISSUE DATE / TIME: 202402231636
Unit Type and Rh: 5100
Unit Type and Rh: 5100

## 2022-05-27 LAB — CBC WITH DIFFERENTIAL/PLATELET
Abs Immature Granulocytes: 0.09 10*3/uL — ABNORMAL HIGH (ref 0.00–0.07)
Basophils Absolute: 0 10*3/uL (ref 0.0–0.1)
Basophils Relative: 0 %
Eosinophils Absolute: 0.1 10*3/uL (ref 0.0–0.5)
Eosinophils Relative: 1 %
HCT: 21.5 % — ABNORMAL LOW (ref 36.0–46.0)
Hemoglobin: 7.1 g/dL — ABNORMAL LOW (ref 12.0–15.0)
Immature Granulocytes: 1 %
Lymphocytes Relative: 25 %
Lymphs Abs: 2 10*3/uL (ref 0.7–4.0)
MCH: 30.6 pg (ref 26.0–34.0)
MCHC: 33 g/dL (ref 30.0–36.0)
MCV: 92.7 fL (ref 80.0–100.0)
Monocytes Absolute: 0.5 10*3/uL (ref 0.1–1.0)
Monocytes Relative: 6 %
Neutro Abs: 5.5 10*3/uL (ref 1.7–7.7)
Neutrophils Relative %: 67 %
Platelets: 141 10*3/uL — ABNORMAL LOW (ref 150–400)
RBC: 2.32 MIL/uL — ABNORMAL LOW (ref 3.87–5.11)
RDW: 17.1 % — ABNORMAL HIGH (ref 11.5–15.5)
WBC: 8.1 10*3/uL (ref 4.0–10.5)
nRBC: 0 % (ref 0.0–0.2)

## 2022-05-27 LAB — HEMOGLOBIN AND HEMATOCRIT, BLOOD
HCT: 22.4 % — ABNORMAL LOW (ref 36.0–46.0)
Hemoglobin: 7.4 g/dL — ABNORMAL LOW (ref 12.0–15.0)

## 2022-05-27 LAB — TYPE AND SCREEN
ABO/RH(D): O POS
Antibody Screen: NEGATIVE
Unit division: 0
Unit division: 0

## 2022-05-27 SURGERY — ESOPHAGOGASTRODUODENOSCOPY (EGD) WITH PROPOFOL
Anesthesia: Monitor Anesthesia Care

## 2022-05-27 SURGERY — IMAGING PROCEDURE, GI TRACT, INTRALUMINAL, VIA CAPSULE

## 2022-05-27 MED ORDER — SODIUM CHLORIDE 0.9 % IV SOLN
INTRAVENOUS | Status: AC | PRN
  Administered 2022-05-27: 500 mL via INTRAVENOUS

## 2022-05-27 MED ORDER — SODIUM CHLORIDE 0.9 % IV SOLN: INTRAVENOUS | Status: DC | PRN

## 2022-05-27 MED ORDER — ORAL CARE MOUTH RINSE: 15.0000 mL | OROMUCOSAL | Status: DC | PRN

## 2022-05-27 MED ORDER — LIDOCAINE 2% (20 MG/ML) 5 ML SYRINGE
INTRAMUSCULAR | Status: DC | PRN
  Administered 2022-05-27: 40 mg via INTRAVENOUS

## 2022-05-27 MED ORDER — PROPOFOL 500 MG/50ML IV EMUL
INTRAVENOUS | Status: DC | PRN
  Administered 2022-05-27: 150 ug/kg/min via INTRAVENOUS

## 2022-05-27 MED ORDER — ONDANSETRON HCL 4 MG/2ML IJ SOLN
4.0000 mg | Freq: Four times a day (QID) | INTRAMUSCULAR | Status: DC | PRN
  Administered 2022-05-27: 4 mg via INTRAVENOUS
  Filled 2022-05-27: qty 2

## 2022-05-27 MED ORDER — SODIUM BICARBONATE 650 MG PO TABS
650.0000 mg | ORAL_TABLET | Freq: Three times a day (TID) | ORAL | Status: DC
  Administered 2022-05-27 – 2022-05-29 (×7): 650 mg via ORAL
  Filled 2022-05-27 (×7): qty 1

## 2022-05-27 MED ORDER — PROPOFOL 10 MG/ML IV BOLUS
INTRAVENOUS | Status: DC | PRN
  Administered 2022-05-27: 50 mg via INTRAVENOUS
  Administered 2022-05-27 (×3): 25 mg via INTRAVENOUS

## 2022-05-27 MED ORDER — SODIUM CHLORIDE 0.9 % IV SOLN: INTRAVENOUS | Status: DC

## 2022-05-27 SURGICAL SUPPLY — 25 items

## 2022-05-27 NOTE — Anesthesia Procedure Notes (Signed)
Procedure Name: MAC Date/Time: 05/27/2022 11:46 AM  Performed by: Renato Shin, CRNAPre-anesthesia Checklist: Patient identified, Emergency Drugs available, Suction available and Patient being monitored Patient Re-evaluated:Patient Re-evaluated prior to induction Oxygen Delivery Method: Nasal cannula Preoxygenation: Pre-oxygenation with 100% oxygen Induction Type: IV induction Airway Equipment and Method: Bite block Placement Confirmation: positive ETCO2 and breath sounds checked- equal and bilateral Dental Injury: Teeth and Oropharynx as per pre-operative assessment

## 2022-05-27 NOTE — Transfer of Care (Signed)
Immediate Anesthesia Transfer of Care Note  Patient: Christina Mack  Procedure(s) Performed: ESOPHAGOGASTRODUODENOSCOPY (EGD) WITH PROPOFOL COLONOSCOPY WITH PROPOFOL BIOPSY  Patient Location: PACU  Anesthesia Type:MAC  Level of Consciousness: awake and patient cooperative  Airway & Oxygen Therapy: Patient Spontanous Breathing and Patient connected to nasal cannula oxygen  Post-op Assessment: Report given to RN and Post -op Vital signs reviewed and stable  Post vital signs: Reviewed and stable  Last Vitals:  Vitals Value Taken Time  BP 140/81 05/27/22 1223  Temp 36.8 C 05/27/22 1223  Pulse 88 05/27/22 1224  Resp 20 05/27/22 1224  SpO2 99 % 05/27/22 1224  Vitals shown include unvalidated device data.  Last Pain:  Vitals:   05/27/22 1223  TempSrc: Temporal  PainSc: 0-No pain      Patients Stated Pain Goal: 0 ([id number])  Complications: No notable events documented.

## 2022-05-27 NOTE — Anesthesia Postprocedure Evaluation (Signed)
Anesthesia Post Note  Patient: Christina Mack  Procedure(s) Performed: ESOPHAGOGASTRODUODENOSCOPY (EGD) WITH PROPOFOL COLONOSCOPY WITH PROPOFOL BIOPSY     Patient location during evaluation: PACU Anesthesia Type: MAC Level of consciousness: awake and alert Pain management: pain level controlled Vital Signs Assessment: post-procedure vital signs reviewed and stable Respiratory status: spontaneous breathing, nonlabored ventilation, respiratory function stable and patient connected to nasal cannula oxygen Cardiovascular status: stable and blood pressure returned to baseline Postop Assessment: no apparent nausea or vomiting Anesthetic complications: no   No notable events documented.  Last Vitals:  Vitals:   05/27/22 1330 05/27/22 1622  BP: (!) 184/101 (!) 172/97  Pulse: 77 82  Resp:    Temp:    SpO2: 100% 99%    Last Pain:  Vitals:   05/27/22 1223  TempSrc: Temporal  PainSc: 0-No pain                 Santa Lighter

## 2022-05-27 NOTE — Progress Notes (Addendum)
PROGRESS NOTE    Christina Mack  [id number] DOB: [birth year] DOA: 05/26/2022 PCP: System, Provider Not In    Brief Narrative:  Christina Mack, a 57 y/o with a complex medical history with vulvar cancer s/p surgery, XRT and chemo who at last gyn-onc visit in January had no recurrence. 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 She has had osteomyelitis of the pelvis and just finished a prolonged course of IV abx. She is to follow up with ortho[facility]onc at [facility] Tuesday. 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 Joelyn Oms at [facility]. She has chronic anemia with Hgb running between 6-7. Today in clinic her Hgb was 5.1 leading to a referral to [facility] for evaluation.      Assessment and Plan: Acute blood loss anemia-resolved as of 05/26/2022 Patient with chronic iron deficiency anemia and renal failure related anemia for which she gets EPO injections. Hgb runs between 6-7. Now with Hgb 4.8. She has heme positive dark stool but no hematochezia, hemetamesis or melena, no BRBPR. In ED she has received 1u PRBCs. GI is seeing patient and plans, per patient, colonoscopy for 05/27/22. -h/h: 7.4  Cellulitis of right leg Patient with recent treatment for recurrent distal right LE cellulitis. She is finishing a course of cefadroxil. The leg remains erythematous, warm to touch and tender. -in ER started on Augmentin 875 mg BID plus Doxycycline 100 mg BID- not sure will need as she just finished course-- may discuss with ID  Lymphedema of right lower extremity Patient with chronic lymphedema right leg since her pelvic surgery. She has been unable to wear thigh-high stockings since developing celluliti distal right LE -4" elastic bandage to right leg  HTN (hypertension) -Resume coreg 12.5 daily  Continue hydralazine prn  CKD (chronic kidney disease) stage 4, GFR 15-29 ml/min [facility]) Patient followed by Dr. Joelyn Oms. Creatine has been  rising and she may come to dialysis. She still makes urine and shows no signs at admission of fluid overload.  -outpatient nephrology follow up      DVT prophylaxis: Place TED hose Start: 05/26/22 1604    Code Status: Full Code Family Communication:   Disposition Plan:  Level of care: Med-Surg Status is: Inpatient Remains inpatient appropriate because: needs colonoscopy    Consultants:  GI   Subjective: C/o some nausea  Objective: Vitals:   05/27/22 0307 05/27/22 0554 05/27/22 0839 05/27/22 0941  BP: (!) 193/105 (!) 167/98 (!) 178/91 (!) 175/97  Pulse: 84 86 85 86  Resp:  18 17   Temp:  98.8 F (37.1 C)  98.1 F (36.7 C)  TempSrc:  Oral  Oral  SpO2:  99% 100%   Weight:      Height:        Intake/Output Summary (Last 24 hours) at 05/27/2022 1103 Last data filed at 05/27/2022 0800 Gross per 24 hour  Intake 2866.59 ml  Output 0 ml  Net 2866.59 ml   Filed Weights   05/26/22 1105 05/26/22 1946  Weight: 64.4 kg 67.9 kg    Examination:   General: Appearance:    Well developed, well nourished female in no acute distress     Lungs:     respirations unlabored  Heart:    Normal heart rate. Normal rhythm. No murmurs, rubs, or gallops.    MS:   All extremities are intact.    Neurologic:   Awake, alert, oriented x 3.  No apparent focal neurological           defect.        Data Reviewed: I have personally reviewed following labs and imaging studies  CBC: Recent Labs  Lab 05/26/22 0948 05/26/22 1128 05/27/22 0110 05/27/22 0957  WBC  --  8.6 8.1  --   NEUTROABS  --  6.7 5.5  --   HGB 5.1* 4.8* 7.1* 7.4*  HCT  --  16.1* 21.5* 22.4*  MCV  --  101.3* 92.7  --   PLT  --  160 141*  --    Basic Metabolic Panel: Recent Labs  Lab 05/26/22 0950 05/26/22 1128 05/27/22 0109  NA 134* 134* 135  K 4.4 4.6 4.7  CL 109 110 110  CO2 10* 10* 9*  GLUCOSE 118* 129* 80  BUN 86* 86* 82*  CREATININE 7.77* 7.86* 7.59*  CALCIUM 8.1* 8.2* 8.0*  PHOS 8.8*  --   --     GFR: Estimated Creatinine Clearance: 7.5 mL/min (A) (by C-G formula based on SCr of 7.59 mg/dL (H)). Liver Function Tests: Recent Labs  Lab 05/26/22 0950 05/26/22 1128 05/27/22 0109  AST  --  10* 9*  ALT  --  9 9  ALKPHOS  --  88 70  BILITOT  --  0.6 0.7  PROT  --  7.7 7.1  ALBUMIN 2.5* 2.6* 2.4*   No results for input(s): "LIPASE", "AMYLASE" in the last 168 hours. No results for input(s): "AMMONIA" in the last 168 hours. Coagulation Profile: No results for input(s): "INR", "PROTIME" in the last 168 hours. Cardiac Enzymes: No results for input(s): "CKTOTAL", "CKMB", "CKMBINDEX", "TROPONINI" in the last 168 hours. BNP (last 3 results) No results for input(s): "PROBNP" in the last 8760 hours. HbA1C: No results for input(s): "HGBA1C" in the last 72 hours. CBG: No results for input(s): "GLUCAP" in the last 168 hours. Lipid Profile: No results for input(s): "CHOL", "HDL", "LDLCALC", "TRIG", "CHOLHDL", "LDLDIRECT" in the last 72 hours. Thyroid Function Tests: No results for input(s): "TSH", "T4TOTAL", "FREET4", "T3FREE", "THYROIDAB" in the last 72 hours. Anemia Panel: Recent Labs    05/26/22 0950 05/26/22 1613  VITAMINB12  --  1,085*  FOLATE  --  8.1  FERRITIN 1,417* 1,239*  TIBC 172* 161*  IRON 39 31  RETICCTPCT  --  2.0   Sepsis Labs: No results for input(s): "PROCALCITON", "LATICACIDVEN" in the last 168 hours.  No results found for this or any previous visit (from the past 240 hour(s)).       Radiology Studies: No results found.      Scheduled Meds:  amLODipine  10 mg Oral Once   amoxicillin-clavulanate  1 tablet Oral Daily   carvedilol  12.5 mg Oral BID WC   doxycycline  100 mg Oral Q12H   pantoprazole  40 mg Oral BID   predniSONE  5 mg Oral Q breakfast   sodium bicarbonate  650 mg Oral TID   Continuous Infusions:  sodium chloride 50 mL/hr at 05/26/22 2058     LOS: 1 day    Time spent: 45 minutes spent on chart review, discussion with  nursing staff, consultants, updating family and interview/physical exam; more than 50% of that time was spent in counseling and/or coordination of care.    Geradine Girt, DO [facility] Available via Epic secure chat 7am-7pm After these hours, please refer to coverage provider listed on [email or web address] 05/27/2022, 11:03 AM

## 2022-05-27 NOTE — Anesthesia Preprocedure Evaluation (Addendum)
Anesthesia Evaluation  Patient identified by MRN, date of birth, ID band Patient awake    Reviewed: Allergy & Precautions, NPO status , Patient's Chart, lab work & pertinent test results, reviewed documented beta blocker date and time   Airway Mallampati: I  TM Distance: >3 FB Neck ROM: Full    Dental  (+) Teeth Intact, Dental Advisory Given   Pulmonary neg pulmonary ROS   Pulmonary exam normal breath sounds clear to auscultation       Cardiovascular hypertension, Pt. on home beta blockers and Pt. on medications + DVT  Normal cardiovascular exam Rhythm:Regular Rate:Normal     Neuro/Psych negative neurological ROS     GI/Hepatic Neg liver ROS, PUD,,,  Endo/Other  negative endocrine ROS    Renal/GU Renal InsufficiencyRenal diseaseS/p kidney tx     Musculoskeletal negative musculoskeletal ROS (+)    Abdominal   Peds  Hematology  (+) Blood dyscrasia (Thrombocytopenia), anemia   Anesthesia Other Findings Day of surgery medications reviewed with the patient.  Reproductive/Obstetrics vulvar cancer s/p surgery, XRT and chemo                             Anesthesia Physical Anesthesia Plan  ASA: 4  Anesthesia Plan: MAC   Post-op Pain Management:    Induction: Intravenous  PONV Risk Score and Plan: 2 and Treatment may vary due to age or medical condition and TIVA  Airway Management Planned: Natural Airway and Simple Face Mask  Additional Equipment:   Intra-op Plan:   Post-operative Plan:   Informed Consent: I have reviewed the patients History and Physical, chart, labs and discussed the procedure including the risks, benefits and alternatives for the proposed anesthesia with the patient or authorized representative who has indicated his/her understanding and acceptance.     Dental advisory given  Plan Discussed with: CRNA and Anesthesiologist  Anesthesia Plan Comments:          Anesthesia Quick Evaluation

## 2022-05-27 NOTE — Interval H&P Note (Signed)
History and Physical Interval Note:  05/27/2022 11:15 AM  Christina Mack  has presented today for surgery, with the diagnosis of Anemia, hemoccult positive.  The various methods of treatment have been discussed with the patient and family. After consideration of risks, benefits and other options for treatment, the patient has consented to  Procedure(s): ESOPHAGOGASTRODUODENOSCOPY (EGD) WITH PROPOFOL (N/A) COLONOSCOPY WITH PROPOFOL (N/A) as a surgical intervention.  The patient's history has been reviewed, patient examined, no change in status, stable for surgery.  I have reviewed the patient's chart and labs.  Questions were answered to the patient's satisfaction.     Thornton Park

## 2022-05-27 NOTE — Op Note (Signed)
[facility] Patient Name: Christina Mack Procedure Date : 05/27/2022 MRN: [id number] Attending MD: Thornton Park MD, MD, [id number] Date of Birth: [birth year] CSN: [id number] Age: 57 Admit Type: Inpatient Procedure:                Upper GI endoscopy Indications:              Heme positive stool Providers:                Thornton Park MD, MD, Vista Lawman, RN, Ladona Ridgel, Technician Referring MD:              Medicines:                Monitored Anesthesia Care Complications:            No immediate complications. Estimated Blood Loss:     Estimated blood loss was minimal. Procedure:                Pre-Anesthesia Assessment:                           - Prior to the procedure, a History and Physical                            was performed, and patient medications and                            allergies were reviewed. The patient's tolerance of                            previous anesthesia was also reviewed. The risks                            and benefits of the procedure and the sedation                            options and risks were discussed with the patient.                            All questions were answered, and informed consent                            was obtained. Prior Anticoagulants: The patient has                            taken no anticoagulant or antiplatelet agents. ASA                            Grade Assessment: III - A patient with severe                            systemic disease. After reviewing the risks and  benefits, the patient was deemed in satisfactory                            condition to undergo the procedure.                           After obtaining informed consent, the endoscope was                            passed under direct vision. Throughout the                            procedure, the patient's blood pressure, pulse, and                            oxygen  saturations were monitored continuously. The                            [id number]) Olympus endoscope was introduced                            through the mouth, and advanced to the third part                            of duodenum. The upper GI endoscopy was                            accomplished without difficulty. The patient                            tolerated the procedure well. Scope In: Scope Out: Findings:      The examined esophagus was normal.      Patchy mildly erythematous mucosa without bleeding was found in the       gastric body. Biopsies were taken from the antrum, body, and antrum with       a cold forceps for histology. Estimated blood loss was minimal.      The examined duodenum showed mild erythema in the duodenal bulb.       Biopsies were taken with a cold forceps for histology. Estimated blood       loss was minimal. Impression:               - Normal esophagus.                           - Erythematous mucosa in the gastric body. Biopsied.                           - Mild duodenitis. Biopsied. Recommendation:           - Return patient to hospital ward for ongoing care.                           - Resume previous diet.                           -  Continue present medications.                           - Await pathology results.                           - Pantoprazole 40 mg QAM.                           - Avoid NSAIDs as able.                           - Proceed with colonoscopy today as planned.                           - The findings and recommendations were discussed                            with the patient. Procedure Code(s):        --- Professional ---                           [phone], Esophagogastroduodenoscopy, flexible,                            transoral; with biopsy, single or multiple Diagnosis Code(s):        --- Professional ---                           K31.89, Other diseases of stomach and duodenum                           R19.5,  Other fecal abnormalities CPT copyright 2022 American Medical Association. All rights reserved. The codes documented in this report are preliminary and upon coder review may  be revised to meet current compliance requirements. Thornton Park MD, MD 05/27/2022 12:37:11 PM This report has been signed electronically. Number of Addenda: 0

## 2022-05-27 NOTE — Op Note (Addendum)
[facility] Patient Name: Christina Mack Procedure Date : 05/27/2022 MRN: [id number] Attending MD: Thornton Park MD, MD, [id number] Date of Birth: [birth year] CSN: [id number] Age: 57 Admit Type: Inpatient Procedure:                Colonoscopy Indications:              Gastrointestinal occult blood loss Providers:                Thornton Park MD, MD, Vista Lawman, RN, Ladona Ridgel, Technician Referring MD:              Medicines:                Monitored Anesthesia Care Complications:            No immediate complications. Estimated Blood Loss:     Estimated blood loss: none. Procedure:                Pre-Anesthesia Assessment:                           - Prior to the procedure, a History and Physical                            was performed, and patient medications and                            allergies were reviewed. The patient's tolerance of                            previous anesthesia was also reviewed. The risks                            and benefits of the procedure and the sedation                            options and risks were discussed with the patient.                            All questions were answered, and informed consent                            was obtained. Prior Anticoagulants: The patient has                            taken no anticoagulant or antiplatelet agents. ASA                            Grade Assessment: III - A patient with severe                            systemic disease. After reviewing the risks and  benefits, the patient was deemed in satisfactory                            condition to undergo the procedure.                           After obtaining informed consent, the colonoscope                            was passed under direct vision. Throughout the                            procedure, the patient's blood pressure, pulse, and                            oxygen  saturations were monitored continuously. The                            [id number]) Olympus colonoscope was                            introduced through the anus and advanced to the 3                            cm into the ileum. A second forward view of the                            right colon was performed. The colonoscopy was                            performed without difficulty. The patient tolerated                            the procedure well. The quality of the bowel                            preparation was good although there was semi-liquid                            stool throughout. The terminal ileum, ileocecal                            valve, appendiceal orifice, and rectum were                            photographed. Scope In: 12:04:46 PM Scope Out: 12:16:53 PM Scope Withdrawal Time: 0 hours 9 minutes 1 second  Total Procedure Duration: 0 hours 12 minutes 7 seconds  Findings:      A moderate amount of semi-liquid stool was found in the entire colon but       there were no obvious polyps or masses identified.      A single small-mouthed diverticulum was found in the sigmoid colon.      Non-bleeding internal and external hemorrhoids were found. Impression:               -  Diverticulosis in the sigmoid colon.                           - Non-bleeding internal and external hemorrhoids.                           - Liquid stool in the entire examined colon.                           - No source of anemia identified. Recommendation:           - Patient has a contact number available for                            emergencies. The signs and symptoms of potential                            delayed complications were discussed with the                            patient. Return to normal activities tomorrow.                            Written discharge instructions were provided to the                            patient.                           - Proceed with  capsule endoscopy tomorrow to                            complete the evaluat for causes of GI blood loss                            anemia.                           - High fiber diet when eating again.                           - Continue present medications.                           - Repeat colonoscopy in 10 years for surveillance.                           - The findings and recommendations were discussed                            with the patient. Procedure Code(s):        --- Professional ---                           [phone], Colonoscopy, flexible; diagnostic, including  collection of specimen(s) by brushing or washing,                            when performed (separate procedure) Diagnosis Code(s):        --- Professional ---                           K64.8, Other hemorrhoids                           R19.5, Other fecal abnormalities                           K57.30, Diverticulosis of large intestine without                            perforation or abscess without bleeding CPT copyright 2022 American Medical Association. All rights reserved. The codes documented in this report are preliminary and upon coder review may  be revised to meet current compliance requirements. Thornton Park MD, MD 05/27/2022 12:52:50 PM This report has been signed electronically. Number of Addenda: 0

## 2022-05-27 NOTE — Progress Notes (Signed)
Patient finished second unit of blood.  B/P was 191/100 (MAP - 130).  She is asymptomatic.  She had PRN Norvasc ordered but she states it gives her a severe headache.  Dr. Nevada Crane made aware.  PRN IV Metoprolol ordered.  Also, verified that IVF - NS @ 50cc - was to be started.  Also, requested WOC consult for open wound to right groin with complete lack of labia majora and labia minora.  Per patient, she uses Aquacel to pack the wound and secures with pad and underwear.  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  She did not want me to look at the wound now.  Unable to do Aquacel d/t to bowel prep.  Dr. Nevada Crane aware.  Will continue to monitor.  Earleen Reaper RN

## 2022-05-27 NOTE — Consult Note (Signed)
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 I will continue that therapy in house. Wound type: thermal (radiation therapy, moist cell desquamation) Pressure Injury POA: N/A Measurement:Not measured. Bedside RN to measure with next dressing change and document measurements on Nursing flow sheets Wound bed:Not  seen today Drainage (amount, consistency, odor) moderate serous Dressing procedure/placement/frequency: I have provided Nursing with guidance for the care of this area using the products that the patient is using successfully at home I.e. silver hydrofiber (Aquacel Ag+ Advantage, Kellie Simmering # [id number]). This dressing is to be applied after NS cleanse and topped with an ABD pad (or peri pad) and secured with underwear or mesh briefs. Change frequency is every other day and PRN dressing dislodgement or as indicated by exudate.  [place] nursing team will not follow, but will remain available to this patient, the nursing and medical teams.  Please re-consult if needed.  Thank you for inviting [place] to participate in this patient's Plan of Care.  Maudie Flakes, MSN, RN, CNS, [employer], Serita Grammes, [employer], [employer] phone:  [phone]

## 2022-05-28 ENCOUNTER — Encounter (HOSPITAL_COMMUNITY)
Admission: EM | Disposition: A | Discharge status: Home / Self Care | Payer: Self-pay | Source: Home / Self Care | Attending: Internal Medicine

## 2022-05-28 HISTORY — PX: GIVENS CAPSULE STUDY: SHX5432

## 2022-05-28 LAB — BASIC METABOLIC PANEL
Anion gap: 15 (ref 5–15)
BUN: 76 mg/dL — ABNORMAL HIGH (ref 6–20)
CO2: 10 mmol/L — ABNORMAL LOW (ref 22–32)
Calcium: 8.2 mg/dL — ABNORMAL LOW (ref 8.9–10.3)
Chloride: 111 mmol/L (ref 98–111)
Creatinine, Ser: 7.63 mg/dL — ABNORMAL HIGH (ref 0.44–1.00)
GFR, Estimated: 6 mL/min — ABNORMAL LOW (ref >60)
Glucose, Bld: 92 mg/dL (ref 70–99)
Potassium: 4.2 mmol/L (ref 3.5–5.1)
Sodium: 136 mmol/L (ref 135–145)

## 2022-05-28 LAB — PTH, INTACT AND CALCIUM
Calcium, Total (PTH): 7.9 mg/dL — ABNORMAL LOW (ref 8.7–10.2)
PTH: 683 pg/mL — ABNORMAL HIGH (ref 15–65)

## 2022-05-28 LAB — CBC
HCT: 22.3 % — ABNORMAL LOW (ref 36.0–46.0)
Hemoglobin: 7.4 g/dL — ABNORMAL LOW (ref 12.0–15.0)
MCH: 30.3 pg (ref 26.0–34.0)
MCHC: 33.2 g/dL (ref 30.0–36.0)
MCV: 91.4 fL (ref 80.0–100.0)
Platelets: 142 10*3/uL — ABNORMAL LOW (ref 150–400)
RBC: 2.44 MIL/uL — ABNORMAL LOW (ref 3.87–5.11)
RDW: 17.3 % — ABNORMAL HIGH (ref 11.5–15.5)
WBC: 8.2 10*3/uL (ref 4.0–10.5)
nRBC: 0 % (ref 0.0–0.2)

## 2022-05-28 SURGERY — IMAGING PROCEDURE, GI TRACT, INTRALUMINAL, VIA CAPSULE
Anesthesia: LOCAL

## 2022-05-28 MED ORDER — CARVEDILOL 25 MG PO TABS
25.0000 mg | ORAL_TABLET | Freq: Two times a day (BID) | ORAL | Status: DC
  Administered 2022-05-28 – 2022-05-29 (×3): 25 mg via ORAL
  Filled 2022-05-28 (×3): qty 1

## 2022-05-28 NOTE — Plan of Care (Signed)
  Problem: Health Behavior/Discharge Planning: Goal: Ability to manage health-related needs will improve Outcome: Progressing   Problem: Nutrition: Goal: Adequate nutrition will be maintained Outcome: Progressing   Problem: Coping: Goal: Level of anxiety will decrease Outcome: Progressing   

## 2022-05-28 NOTE — Progress Notes (Signed)
Reached out to [place] regarding capsule study camera turned off a little bit before 6 pm. GI doctor aware. Instructed to just leave it.

## 2022-05-28 NOTE — Progress Notes (Signed)
PROGRESS NOTE    Christina Mack  [id number] DOB: 12/20/1965 DOA: 05/26/2022 PCP: System, Provider Not In    Brief Narrative:  Christina Mack, a 57 y/o with a complex medical history with vulvar cancer s/p surgery, XRT and chemo who at last gyn-onc visit in January had no recurrence. 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 She has had osteomyelitis of the pelvis and just finished a prolonged course of IV abx. She is to follow up with ortho-onc at [facility] Tuesday. 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 Joelyn Mack at [facility]. She has chronic anemia with Hgb running between 6-7. Today in clinic her Hgb was 5.1 leading to a referral to [facility] for evaluation.      Assessment and Plan: Acute blood loss anemia-resolved as of 05/26/2022 Patient with chronic iron deficiency anemia and renal failure related anemia for which she gets EPO injections. Hgb runs between 6-7. Now with Hgb 4.8. She has heme positive dark stool but no hematochezia, hemetamesis or melena, no BRBPR. In ED she has received 1u PRBCs. GI is seeing patient and plans, per patient, colonoscopy for 05/27/22- unrevealing, now getting capsule endoscopy -h/h: 7.4  Cellulitis of right leg Patient with recent treatment for recurrent distal right LE cellulitis. She is finishing a course of cefadroxil. The leg remains erythematous, warm to touch and tender. -in ER started on Augmentin 875 mg BID plus Doxycycline 100 mg BID with improvement  Lymphedema of right lower extremity Patient with chronic lymphedema right leg since her pelvic surgery. She has been unable to wear thigh-high stockings since developing celluliti distal right LE -4" elastic bandage to right leg  HTN (hypertension) -Resume coreg 12.5 daily  Continue hydralazine prn  CKD (chronic kidney disease) stage 4, GFR 15-29 ml/min [facility]) Patient followed by Dr. Joelyn Mack. Creatine has been rising and  she may come to dialysis. She still makes urine and shows no signs at admission of fluid overload.  -outpatient nephrology follow up      DVT prophylaxis: Place TED hose Start: 05/26/22 1604    Code Status: Full Code Family Communication:   Disposition Plan:  Level of care: Med-Surg Status is: Inpatient Remains inpatient appropriate because: home in AM    Consultants:  GI   Subjective: Has important appointment at [facility] on Tuesday  Objective: Vitals:   05/27/22 1949 05/27/22 2258 05/28/22 0552 05/28/22 0851  BP: (!) 182/101 (!) 170/97 (!) 167/94 (!) 180/102  Pulse: 85 88 94 98  Resp: '18 18 17 18  '$ Temp: 99 F (37.2 C)  98.9 F (37.2 C) 98.7 F (37.1 C)  TempSrc: Oral  Oral Oral  SpO2: 100% 100% 97% 99%  Weight:      Height:        Intake/Output Summary (Last 24 hours) at 05/28/2022 1243 Last data filed at 05/28/2022 [id number] Gross per 24 hour  Intake 1740.36 ml  Output 0 ml  Net 1740.36 ml   Filed Weights   05/26/22 1105 05/26/22 1946  Weight: 64.4 kg 67.9 kg    Examination:    General: Appearance:    Well developed, well nourished female in no acute distress     Lungs:     respirations unlabored  Heart:    Normal heart rate.   MS:   All extremities are intact.   Neurologic:   Awake, alert, oriented x 3  Data Reviewed: I have personally reviewed following labs and imaging studies  CBC: Recent Labs  Lab 05/26/22 0948 05/26/22 1128 05/27/22 0110 05/27/22 0957 05/28/22 0410  WBC  --  8.6 8.1  --  8.2  NEUTROABS  --  6.7 5.5  --   --   HGB 5.1* 4.8* 7.1* 7.4* 7.4*  HCT  --  16.1* 21.5* 22.4* 22.3*  MCV  --  101.3* 92.7  --  91.4  PLT  --  160 141*  --  [id number]*   Basic Metabolic Panel: Recent Labs  Lab 05/26/22 0950 05/26/22 1128 05/27/22 0109 05/28/22 0410  NA 134* 134* 135 136  K 4.4 4.6 4.7 4.2  CL 109 110 110 111  CO2 10* 10* 9* 10*  GLUCOSE 118* 129* 80 92  BUN 86* 86* 82* 76*  CREATININE 7.77* 7.86* 7.59* 7.63*  CALCIUM 8.1*  8.2* 8.0* 8.2*  PHOS 8.8*  --   --   --    GFR: Estimated Creatinine Clearance: 7.5 mL/min (A) (by C-G formula based on SCr of 7.63 mg/dL (H)). Liver Function Tests: Recent Labs  Lab 05/26/22 0950 05/26/22 1128 05/27/22 0109  AST  --  10* 9*  ALT  --  9 9  ALKPHOS  --  88 70  BILITOT  --  0.6 0.7  PROT  --  7.7 7.1  ALBUMIN 2.5* 2.6* 2.4*   No results for input(s): "LIPASE", "AMYLASE" in the last 168 hours. No results for input(s): "AMMONIA" in the last 168 hours. Coagulation Profile: No results for input(s): "INR", "PROTIME" in the last 168 hours. Cardiac Enzymes: No results for input(s): "CKTOTAL", "CKMB", "CKMBINDEX", "TROPONINI" in the last 168 hours. BNP (last 3 results) No results for input(s): "PROBNP" in the last 8760 hours. HbA1C: No results for input(s): "HGBA1C" in the last 72 hours. CBG: No results for input(s): "GLUCAP" in the last 168 hours. Lipid Profile: No results for input(s): "CHOL", "HDL", "LDLCALC", "TRIG", "CHOLHDL", "LDLDIRECT" in the last 72 hours. Thyroid Function Tests: No results for input(s): "TSH", "T4TOTAL", "FREET4", "T3FREE", "THYROIDAB" in the last 72 hours. Anemia Panel: Recent Labs    05/26/22 0950 05/26/22 1613  VITAMINB12  --  1,085*  FOLATE  --  8.1  FERRITIN 1,417* 1,239*  TIBC 172* 161*  IRON 39 31  RETICCTPCT  --  2.0   Sepsis Labs: No results for input(s): "PROCALCITON", "LATICACIDVEN" in the last 168 hours.  No results found for this or any previous visit (from the past 240 hour(s)).       Radiology Studies: No results found.      Scheduled Meds:  amoxicillin-clavulanate  1 tablet Oral Daily   carvedilol  25 mg Oral BID WC   doxycycline  100 mg Oral Q12H   pantoprazole  40 mg Oral BID   predniSONE  5 mg Oral Q breakfast   sodium bicarbonate  650 mg Oral TID   Continuous Infusions:     LOS: 2 days    Time spent: 45 minutes spent on chart review, discussion with nursing staff, consultants, updating  family and interview/physical exam; more than 50% of that time was spent in counseling and/or coordination of care.    Christina Girt, DO [facility] Available via Epic secure chat 7am-7pm After these hours, please refer to coverage provider listed on [email or web address] 05/28/2022, 12:43 PM

## 2022-05-28 NOTE — Progress Notes (Signed)
Pt ingested capsule @ [id number].  Instructions given to patient, she verbalized understanding.    Vista Lawman, RN

## 2022-05-28 NOTE — Progress Notes (Signed)
     [facility] Progress Note   No overt bleeding following EGD and colonoscopy yesterday. Hemoglobin overall stable.  Capsule endoscopy underway. Anticipate discharge tomorrow. We will contact the patient by phone when the results are available.   GI will move to stand-by. Please call the on-call gastroenterologist with any additional questions or concerns during this hospitalization.

## 2022-05-29 ENCOUNTER — Encounter (HOSPITAL_COMMUNITY): Payer: Self-pay | Admitting: Gastroenterology

## 2022-05-29 ENCOUNTER — Telehealth: Payer: Self-pay

## 2022-05-29 ENCOUNTER — Encounter (HOSPITAL_COMMUNITY): Payer: Self-pay

## 2022-05-29 DIAGNOSIS — K2970 Gastritis, unspecified, without bleeding: Secondary | ICD-10-CM

## 2022-05-29 DIAGNOSIS — D62 Acute posthemorrhagic anemia: Secondary | ICD-10-CM

## 2022-05-29 MED ORDER — DOXYCYCLINE HYCLATE 100 MG PO TABS: 100.0000 mg | ORAL_TABLET | Freq: Two times a day (BID) | ORAL | 0 refills | Status: DC

## 2022-05-29 MED ORDER — CARVEDILOL 12.5 MG PO TABS: 25.0000 mg | ORAL_TABLET | Freq: Two times a day (BID) | ORAL | Status: DC

## 2022-05-29 MED ORDER — AMOXICILLIN-POT CLAVULANATE 500-125 MG PO TABS: 1.0000 | ORAL_TABLET | Freq: Every day | ORAL | 0 refills | Status: DC

## 2022-05-29 NOTE — Plan of Care (Signed)

## 2022-05-29 NOTE — Discharge Summary (Signed)
Physician Discharge Summary  Christina Mack [id number] DOB: [birth year] DOA: 05/26/2022  PCP: System, Provider Not In  Admit date: 05/26/2022 Discharge date: 05/29/2022  Admitted From: home Discharge disposition: home   Recommendations for Outpatient Follow-Up:   Close nephrology follow up Close ID follow up when finish abx to ensure treatment GI to call with capsule results   Discharge Diagnosis:   Active Problems:   Normocytic anemia   Cellulitis of right leg   CKD (chronic kidney disease) stage 4, GFR 15-29 ml/min (HCC)   Chronic pain syndrome   HTN (hypertension)   Lymphedema of right lower extremity   Gastritis and gastroduodenitis    Discharge Condition: Improved.  Diet recommendation:renal  Wound care: None.  Code status: Full.   History of Present Illness:   Christina Mack, a 57 y/o with a complex medical history with vulvar cancer s/p surgery, XRT and chemo who at last gyn-onc visit in January had no recurrence. 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 She has had osteomyelitis of the pelvis and just finished a prolonged course of IV abx. She is to follow up with ortho-onc at [facility] Tuesday. 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 Joelyn Oms at [facility]. She has chronic anemia with Hgb running between 6-7. Today in clinic her Hgb was 5.1 leading to a referral to [facility] for evaluation.       Hospital Course by Problem:   Acute blood loss anemia-resolved as of 05/26/2022 Patient with chronic iron deficiency anemia and renal failure related anemia for which she gets EPO injections. Hgb runs between 6-7. Now with Hgb 4.8. She has heme positive dark stool but no hematochezia, hemetamesis or melena, no BRBPR. In ED she has received 1u PRBCs. GI is seeing patient and plans, per patient, colonoscopy for 05/27/22- unrevealing, now getting capsule endoscopy- GI to call results to  patient -h/h: 7.4 and stable   Cellulitis of right leg Patient with recent treatment for recurrent distal right LE cellulitis. She is finishing a course of cefadroxil. The leg remains erythematous, warm to touch and tender. -in ER started on Augmentin 875 mg BID plus Doxycycline 100 mg BID with improvement- will complete course   Lymphedema of right lower extremity Patient with chronic lymphedema right leg since her pelvic surgery. She has been unable to wear thigh-high stockings since developing celluliti distal right LE -outpatient follow up   HTN (hypertension) -Resume coreg 25 mg   CKD (chronic kidney disease) stage 4, GFR 15-29 ml/min (HCC) Patient followed by Dr. Joelyn Oms. Creatine has been rising and she may come to dialysis. She still makes urine and shows no signs at admission of fluid overload.  -outpatient nephrology follow up      Medical Consultants:    GI  Discharge Exam:   Vitals:   05/28/22 2115 05/29/22 0545  BP: (!) 168/102 (!) 177/101  Pulse: 87 87  Resp: 18 18  Temp: 98.7 F (37.1 C) 97.6 F (36.4 C)  SpO2: 98% 99%   Vitals:   05/28/22 0851 05/28/22 1651 05/28/22 2115 05/29/22 0545  BP: (!) 180/102 (!) 151/94 (!) 168/102 (!) 177/101  Pulse: 98 88 87 87  Resp: '18 19 18 18  '$ Temp: 98.7 F (37.1 C) 98.8 F (37.1 C) 98.7 F (37.1 C) 97.6 F (36.4 C)  TempSrc: Oral Oral Oral Oral  SpO2: 99% 99% 98% 99%  Weight:  Height:        General exam: Appears calm and comfortable.    The results of significant diagnostics from this hospitalization (including imaging, microbiology, ancillary and laboratory) are listed below for reference.     Procedures and Diagnostic Studies:   No results found.   Labs:   Basic Metabolic Panel: Recent Labs  Lab 05/26/22 0950 05/26/22 1128 05/27/22 0109 05/28/22 0410  NA 134* 134* 135 136  K 4.4 4.6 4.7 4.2  CL 109 110 110 111  CO2 10* 10* 9* 10*  GLUCOSE 118* 129* 80 92  BUN 86* 86* 82* 76*   CREATININE 7.77* 7.86* 7.59* 7.63*  CALCIUM 8.1*  7.9* 8.2* 8.0* 8.2*  PHOS 8.8*  --   --   --    GFR Estimated Creatinine Clearance: 7.5 mL/min (A) (by C-G formula based on SCr of 7.63 mg/dL (H)). Liver Function Tests: Recent Labs  Lab 05/26/22 0950 05/26/22 1128 05/27/22 0109  AST  --  10* 9*  ALT  --  9 9  ALKPHOS  --  88 70  BILITOT  --  0.6 0.7  PROT  --  7.7 7.1  ALBUMIN 2.5* 2.6* 2.4*   No results for input(s): "LIPASE", "AMYLASE" in the last 168 hours. No results for input(s): "AMMONIA" in the last 168 hours. Coagulation profile No results for input(s): "INR", "PROTIME" in the last 168 hours.  CBC: Recent Labs  Lab 05/26/22 0948 05/26/22 1128 05/27/22 0110 05/27/22 0957 05/28/22 0410  WBC  --  8.6 8.1  --  8.2  NEUTROABS  --  6.7 5.5  --   --   HGB 5.1* 4.8* 7.1* 7.4* 7.4*  HCT  --  16.1* 21.5* 22.4* 22.3*  MCV  --  101.3* 92.7  --  91.4  PLT  --  160 141*  --  142*   Cardiac Enzymes: No results for input(s): "CKTOTAL", "CKMB", "CKMBINDEX", "TROPONINI" in the last 168 hours. BNP: Invalid input(s): "POCBNP" CBG: No results for input(s): "GLUCAP" in the last 168 hours. D-Dimer No results for input(s): "DDIMER" in the last 72 hours. Hgb A1c No results for input(s): "HGBA1C" in the last 72 hours. Lipid Profile No results for input(s): "CHOL", "HDL", "LDLCALC", "TRIG", "CHOLHDL", "LDLDIRECT" in the last 72 hours. Thyroid function studies No results for input(s): "TSH", "T4TOTAL", "T3FREE", "THYROIDAB" in the last 72 hours.  Invalid input(s): "FREET3" Anemia work up Recent Labs    05/26/22 0950 05/26/22 1613  VITAMINB12  --  1,085*  FOLATE  --  8.1  FERRITIN 1,417* 1,239*  TIBC 172* 161*  IRON 39 31  RETICCTPCT  --  2.0   Microbiology No results found for this or any previous visit (from the past 240 hour(s)).   Discharge Instructions:   Discharge Instructions     Diet - low sodium heart healthy   Complete by: As directed    Increase  activity slowly   Complete by: As directed    No wound care   Complete by: As directed       Allergies as of 05/29/2022       Reactions   Oxycodone-acetaminophen Itching, Nausea And Vomiting, Nausea Only, Rash, Other (See Comments)   Headaches, also   Piperacillin-tazobactam In Dex    Other Reaction(s): Other (See Comments) Thrombocytopenia  Thrombocytopenia   Zosyn [piperacillin Sod-tazobactam So] Other (See Comments)   Thrombocytopenia   Hydromorphone Nausea And Vomiting, Rash, Other (See Comments)   Amlodipine Other (See Comments), Swelling   Ankle  swelling   Codeine    vomiting   Hydralazine    Headache   Zyvox [linezolid] Other (See Comments)   Decreased platelet counts        Medication List     STOP taking these medications    cefadroxil 500 MG capsule Commonly known as: DURICEF   hydrALAZINE 25 MG tablet Commonly known as: APRESOLINE       TAKE these medications    acetaminophen 500 MG tablet Commonly known as: TYLENOL Take 2 tablets (1,000 mg total) by mouth 3 (three) times daily. What changed:  when to take this reasons to take this   amoxicillin-clavulanate 500-125 MG tablet Commonly known as: AUGMENTIN Take 1 tablet by mouth daily.   carvedilol 12.5 MG tablet Commonly known as: COREG Take 2 tablets (25 mg total) by mouth 2 (two) times daily with a meal.   doxycycline 100 MG tablet Commonly known as: VIBRA-TABS Take 1 tablet (100 mg total) by mouth every 12 (twelve) hours.   pantoprazole 40 MG tablet Commonly known as: PROTONIX TAKE 1 TABLET (40 MG TOTAL) BY MOUTH 2 (TWO) TIMES DAILY BEFORE A MEAL. What changed:  how much to take when to take this   predniSONE 5 MG tablet Commonly known as: DELTASONE Take 5 mg by mouth daily with breakfast.   Prograf 1 MG capsule Generic drug: tacrolimus Take 1 mg by mouth 2 (two) times daily.   sodium bicarbonate 650 MG tablet Take 1 tablet (650 mg total) by mouth 3 (three) times daily.    VITAMIN D PO Take 1 tablet by mouth daily.          Time coordinating discharge: 45 min  Signed:  Geradine Girt DO  [facility] 05/29/2022, 9:00 AM

## 2022-05-29 NOTE — Telephone Encounter (Signed)
-----   Message from Alfredia Ferguson, PA-C sent at 05/29/2022  3:16 PM EST ----- Regarding: Capsule Pt had capsule done while inpatient  at [facility] yesterday- she was discharged today before we saw her .  Beth - please call pt and let her know the Capsule endoscopy was negative - no findings to explain her anemia which is very likely anemia of chronic disease related to her renal failure   Her Nephrologist will follow her [facility] outpt  Thanks

## 2022-05-29 NOTE — TOC Transition Note (Signed)
Transition of Care [facility]) - CM/SW Discharge Note   Patient Details  Name: Christina Mack MRN: [id number] Date of Birth: 02-Jun-1965  [facility] CM/SW Contact:  Tom-Johnson, Renea Ee, RN Phone Number: 05/29/2022, 10:06 AM   Clinical Narrative:     Patient is scheduled for discharge today. Outpatient f/u and discharge instructions on AVS. No TOC needs or recommendations noted. Family to transport at discharge. No further TOC needs noted.        Final next level of care: Home/Self Care Barriers to Discharge: Barriers Resolved   Patient Goals and CMS Choice CMS Medicare.gov Compare Post Acute Care list provided to:: Patient Choice offered to / list presented to : NA  Discharge Placement                  Patient to be transferred to facility by: Family      Discharge Plan and Services Additional resources added to the After Visit Summary for                  DME Arranged: N/A DME Agency: NA       HH Arranged: NA HH Agency: NA        Social Determinants of Health (SDOH) Interventions SDOH Screenings   Food Insecurity: No Food Insecurity (05/27/2022)  Housing: Low Risk  (05/27/2022)  Transportation Needs: No Transportation Needs (05/27/2022)  Utilities: Not At Risk (05/27/2022)  Depression (PHQ2-9): Low Risk  (05/23/2022)  Tobacco Use: Low Risk  (05/27/2022)     Readmission Risk Interventions    03/24/2022   11:54 AM  Readmission Risk Prevention Plan  Transportation Screening Complete  Medication Review ([place]) Complete  PCP or Specialist appointment within 3-5 days of discharge Complete  HRI or [place] Complete  SW Recovery Care/Counseling Consult Complete  [place] Not Applicable

## 2022-05-29 NOTE — Progress Notes (Signed)
AVS given and explained to patient. 

## 2022-05-30 ENCOUNTER — Encounter: Payer: Self-pay | Admitting: Gastroenterology

## 2022-05-30 ENCOUNTER — Encounter (HOSPITAL_COMMUNITY): Payer: Self-pay | Admitting: Gastroenterology

## 2022-05-30 LAB — SURGICAL PATHOLOGY

## 2022-05-30 NOTE — Telephone Encounter (Signed)
Patient does not answer the phone. Left details of the results on her voicemail, and my name and contact information if she has any questions.

## 2022-06-02 ENCOUNTER — Encounter (HOSPITAL_COMMUNITY): Payer: Self-pay

## 2022-06-09 ENCOUNTER — Encounter (HOSPITAL_COMMUNITY)
Admission: RE | Admit: 2022-06-09 | Discharge: 2022-06-09 | Disposition: A | Discharge status: Home / Self Care | Payer: Medicare Other | Source: Ambulatory Visit | Attending: Nephrology | Admitting: Nephrology

## 2022-06-09 ENCOUNTER — Encounter (HOSPITAL_COMMUNITY)

## 2022-06-09 VITALS — BP 170/106 | HR 91 | Temp 97.0°F | Resp 17

## 2022-06-09 DIAGNOSIS — Z940 Kidney transplant status: Secondary | ICD-10-CM | POA: Insufficient documentation

## 2022-06-09 DIAGNOSIS — D631 Anemia in chronic kidney disease: Secondary | ICD-10-CM | POA: Diagnosis not present

## 2022-06-09 DIAGNOSIS — N184 Chronic kidney disease, stage 4 (severe): Secondary | ICD-10-CM | POA: Diagnosis not present

## 2022-06-09 LAB — POCT HEMOGLOBIN-HEMACUE: Hemoglobin: 7.4 g/dL — ABNORMAL LOW (ref 12.0–15.0)

## 2022-06-09 MED ORDER — EPOETIN ALFA-EPBX 10000 UNIT/ML IJ SOLN
INTRAMUSCULAR | Status: AC
  Filled 2022-06-09: qty 2

## 2022-06-09 MED ORDER — EPOETIN ALFA-EPBX 10000 UNIT/ML IJ SOLN
20000.0000 [IU] | INTRAMUSCULAR | Status: DC
  Administered 2022-06-09: 20000 [IU] via SUBCUTANEOUS

## 2022-06-09 NOTE — Progress Notes (Signed)
Pt here for retacrit injection.  HGB 7.4 via hemocue, same as 05/28/22.  Amber at Dr [employer] office notified.  Okay to proceed with injection

## 2022-06-13 ENCOUNTER — Ambulatory Visit

## 2022-06-23 ENCOUNTER — Encounter (HOSPITAL_COMMUNITY)
Admission: RE | Admit: 2022-06-23 | Discharge: 2022-06-23 | Disposition: A | Discharge status: Home / Self Care | Payer: Medicare Other | Source: Ambulatory Visit | Attending: Nephrology | Admitting: Nephrology

## 2022-06-23 VITALS — BP 146/90 | HR 89 | Temp 97.3°F | Resp 18

## 2022-06-23 DIAGNOSIS — N184 Chronic kidney disease, stage 4 (severe): Secondary | ICD-10-CM | POA: Diagnosis not present

## 2022-06-23 DIAGNOSIS — Z940 Kidney transplant status: Secondary | ICD-10-CM

## 2022-06-23 LAB — RENAL FUNCTION PANEL
Albumin: 2.8 g/dL — ABNORMAL LOW (ref 3.5–5.0)
Anion gap: 13 (ref 5–15)
BUN: 90 mg/dL — ABNORMAL HIGH (ref 6–20)
CO2: 11 mmol/L — ABNORMAL LOW (ref 22–32)
Calcium: 7.7 mg/dL — ABNORMAL LOW (ref 8.9–10.3)
Chloride: 112 mmol/L — ABNORMAL HIGH (ref 98–111)
Creatinine, Ser: 8.09 mg/dL — ABNORMAL HIGH (ref 0.44–1.00)
GFR, Estimated: 5 mL/min — ABNORMAL LOW (ref >60)
Glucose, Bld: 103 mg/dL — ABNORMAL HIGH (ref 70–99)
Phosphorus: 8.2 mg/dL — ABNORMAL HIGH (ref 2.5–4.6)
Potassium: 3.9 mmol/L (ref 3.5–5.1)
Sodium: 136 mmol/L (ref 135–145)

## 2022-06-23 LAB — IRON AND TIBC
Iron: 34 ug/dL (ref 28–170)
Saturation Ratios: 23 % (ref 10.4–31.8)
TIBC: 150 ug/dL — ABNORMAL LOW (ref 250–450)
UIBC: 116 ug/dL

## 2022-06-23 LAB — POCT HEMOGLOBIN-HEMACUE: Hemoglobin: 8.2 g/dL — ABNORMAL LOW (ref 12.0–15.0)

## 2022-06-23 MED ORDER — EPOETIN ALFA-EPBX 10000 UNIT/ML IJ SOLN
INTRAMUSCULAR | Status: AC
  Filled 2022-06-23: qty 2

## 2022-06-23 MED ORDER — EPOETIN ALFA-EPBX 10000 UNIT/ML IJ SOLN
20000.0000 [IU] | INTRAMUSCULAR | Status: DC
  Administered 2022-06-23: 20000 [IU] via SUBCUTANEOUS

## 2022-06-29 ENCOUNTER — Encounter (HOSPITAL_COMMUNITY): Payer: Self-pay

## 2022-07-03 ENCOUNTER — Encounter (HOSPITAL_COMMUNITY): Payer: Self-pay

## 2022-07-05 ENCOUNTER — Encounter (HOSPITAL_COMMUNITY): Payer: Self-pay

## 2022-07-07 ENCOUNTER — Encounter (HOSPITAL_COMMUNITY)
Admission: RE | Admit: 2022-07-07 | Discharge: 2022-07-07 | Disposition: A | Discharge status: Home / Self Care | Payer: Medicare Other | Source: Ambulatory Visit | Attending: Nephrology | Admitting: Nephrology

## 2022-07-07 VITALS — BP 179/95 | HR 96 | Temp 97.2°F | Resp 18

## 2022-07-07 DIAGNOSIS — Z940 Kidney transplant status: Secondary | ICD-10-CM | POA: Diagnosis present

## 2022-07-07 DIAGNOSIS — N184 Chronic kidney disease, stage 4 (severe): Secondary | ICD-10-CM | POA: Insufficient documentation

## 2022-07-07 LAB — POCT HEMOGLOBIN-HEMACUE: Hemoglobin: 7.6 g/dL — ABNORMAL LOW (ref 12.0–15.0)

## 2022-07-07 MED ORDER — EPOETIN ALFA-EPBX 10000 UNIT/ML IJ SOLN: 20000.0000 [IU] | INTRAMUSCULAR | Status: DC

## 2022-07-07 MED ORDER — EPOETIN ALFA-EPBX 10000 UNIT/ML IJ SOLN
INTRAMUSCULAR | Status: AC
  Administered 2022-07-07: 20000 [IU] via SUBCUTANEOUS
  Filled 2022-07-07: qty 2

## 2022-07-13 ENCOUNTER — Encounter (HOSPITAL_COMMUNITY): Payer: Self-pay

## 2022-07-20 ENCOUNTER — Encounter (HOSPITAL_COMMUNITY)
Admission: RE | Admit: 2022-07-20 | Discharge: 2022-07-20 | Disposition: A | Discharge status: Home / Self Care | Payer: Medicare Other | Source: Ambulatory Visit | Attending: Nephrology | Admitting: Nephrology

## 2022-07-20 VITALS — BP 163/95 | HR 79 | Temp 97.4°F | Resp 18

## 2022-07-20 DIAGNOSIS — N179 Acute kidney failure, unspecified: Secondary | ICD-10-CM | POA: Diagnosis not present

## 2022-07-20 DIAGNOSIS — Z940 Kidney transplant status: Secondary | ICD-10-CM

## 2022-07-20 DIAGNOSIS — N184 Chronic kidney disease, stage 4 (severe): Secondary | ICD-10-CM

## 2022-07-20 DIAGNOSIS — T8619 Other complication of kidney transplant: Secondary | ICD-10-CM | POA: Diagnosis not present

## 2022-07-20 LAB — IRON AND TIBC
Iron: 84 ug/dL (ref 28–170)
Saturation Ratios: 71 % — ABNORMAL HIGH (ref 10.4–31.8)
TIBC: 118 ug/dL — ABNORMAL LOW (ref 250–450)
UIBC: 34 ug/dL

## 2022-07-20 LAB — POCT HEMOGLOBIN-HEMACUE: Hemoglobin: 7.2 g/dL — ABNORMAL LOW (ref 12.0–15.0)

## 2022-07-20 LAB — RENAL FUNCTION PANEL
Albumin: 2.6 g/dL — ABNORMAL LOW (ref 3.5–5.0)
Anion gap: 18 — ABNORMAL HIGH (ref 5–15)
BUN: 138 mg/dL — ABNORMAL HIGH (ref 6–20)
CO2: 8 mmol/L — ABNORMAL LOW (ref 22–32)
Calcium: 6.1 mg/dL — CL (ref 8.9–10.3)
Chloride: 110 mmol/L (ref 98–111)
Creatinine, Ser: 11.74 mg/dL — ABNORMAL HIGH (ref 0.44–1.00)
GFR, Estimated: 3 mL/min — ABNORMAL LOW (ref >60)
Glucose, Bld: 136 mg/dL — ABNORMAL HIGH (ref 70–99)
Phosphorus: 11.5 mg/dL — ABNORMAL HIGH (ref 2.5–4.6)
Potassium: 3.2 mmol/L — ABNORMAL LOW (ref 3.5–5.1)
Sodium: 136 mmol/L (ref 135–145)

## 2022-07-20 MED ORDER — EPOETIN ALFA-EPBX 10000 UNIT/ML IJ SOLN
20000.0000 [IU] | INTRAMUSCULAR | Status: DC
  Administered 2022-07-20: 20000 [IU] via SUBCUTANEOUS

## 2022-07-20 MED ORDER — EPOETIN ALFA-EPBX 10000 UNIT/ML IJ SOLN
INTRAMUSCULAR | Status: AC
  Filled 2022-07-20: qty 2

## 2022-07-20 NOTE — Progress Notes (Addendum)
[facility] notified of pt's hemoCue of 7.2.  Pt states she is having blood tinged sputum when she blows her nose . Instructed to proceed as ordered.  Pt instructed to call the office if symptomatic or bleeding noted.  Pt verbalizes understanding.

## 2022-07-20 NOTE — Progress Notes (Signed)
Dr. Marisue Humble notified of Critical lab results

## 2022-07-20 NOTE — Progress Notes (Unsigned)
Patient did not show for her appt

## 2022-07-21 ENCOUNTER — Inpatient Hospital Stay: Payer: Medicare Other | Attending: Gynecologic Oncology | Admitting: Gynecologic Oncology

## 2022-07-21 ENCOUNTER — Inpatient Hospital Stay (HOSPITAL_COMMUNITY)
Admission: EM | Admit: 2022-07-21 | Discharge: 2022-07-27 | DRG: 673 | Disposition: A | Discharge status: Home / Self Care | Payer: Medicare Other | Attending: Family Medicine | Admitting: Family Medicine

## 2022-07-21 ENCOUNTER — Emergency Department (HOSPITAL_COMMUNITY): Payer: Medicare Other

## 2022-07-21 ENCOUNTER — Other Ambulatory Visit: Payer: Self-pay

## 2022-07-21 ENCOUNTER — Encounter (HOSPITAL_COMMUNITY): Payer: Medicare Other

## 2022-07-21 DIAGNOSIS — Z7952 Long term (current) use of systemic steroids: Secondary | ICD-10-CM

## 2022-07-21 DIAGNOSIS — R531 Weakness: Secondary | ICD-10-CM

## 2022-07-21 DIAGNOSIS — N186 End stage renal disease: Secondary | ICD-10-CM | POA: Diagnosis present

## 2022-07-21 DIAGNOSIS — Z923 Personal history of irradiation: Secondary | ICD-10-CM

## 2022-07-21 DIAGNOSIS — N189 Chronic kidney disease, unspecified: Principal | ICD-10-CM

## 2022-07-21 DIAGNOSIS — N179 Acute kidney failure, unspecified: Secondary | ICD-10-CM | POA: Diagnosis present

## 2022-07-21 DIAGNOSIS — E8809 Other disorders of plasma-protein metabolism, not elsewhere classified: Secondary | ICD-10-CM | POA: Diagnosis present

## 2022-07-21 DIAGNOSIS — Z79621 Long term (current) use of calcineurin inhibitor: Secondary | ICD-10-CM

## 2022-07-21 DIAGNOSIS — C519 Malignant neoplasm of vulva, unspecified: Secondary | ICD-10-CM

## 2022-07-21 DIAGNOSIS — Z96641 Presence of right artificial hip joint: Secondary | ICD-10-CM | POA: Diagnosis present

## 2022-07-21 DIAGNOSIS — M609 Myositis, unspecified: Secondary | ICD-10-CM | POA: Diagnosis present

## 2022-07-21 DIAGNOSIS — Z885 Allergy status to narcotic agent status: Secondary | ICD-10-CM

## 2022-07-21 DIAGNOSIS — Z888 Allergy status to other drugs, medicaments and biological substances status: Secondary | ICD-10-CM

## 2022-07-21 DIAGNOSIS — L03116 Cellulitis of left lower limb: Secondary | ICD-10-CM | POA: Diagnosis present

## 2022-07-21 DIAGNOSIS — L0292 Furuncle, unspecified: Secondary | ICD-10-CM | POA: Diagnosis present

## 2022-07-21 DIAGNOSIS — E876 Hypokalemia: Secondary | ICD-10-CM | POA: Insufficient documentation

## 2022-07-21 DIAGNOSIS — D631 Anemia in chronic kidney disease: Secondary | ICD-10-CM | POA: Diagnosis present

## 2022-07-21 DIAGNOSIS — A46 Erysipelas: Secondary | ICD-10-CM | POA: Diagnosis present

## 2022-07-21 DIAGNOSIS — Z1152 Encounter for screening for COVID-19: Secondary | ICD-10-CM

## 2022-07-21 DIAGNOSIS — Z86718 Personal history of other venous thrombosis and embolism: Secondary | ICD-10-CM

## 2022-07-21 DIAGNOSIS — E872 Acidosis, unspecified: Secondary | ICD-10-CM | POA: Diagnosis present

## 2022-07-21 DIAGNOSIS — E871 Hypo-osmolality and hyponatremia: Secondary | ICD-10-CM | POA: Diagnosis present

## 2022-07-21 DIAGNOSIS — M8660 Other chronic osteomyelitis, unspecified site: Secondary | ICD-10-CM | POA: Diagnosis present

## 2022-07-21 DIAGNOSIS — Z90710 Acquired absence of both cervix and uterus: Secondary | ICD-10-CM

## 2022-07-21 DIAGNOSIS — Y830 Surgical operation with transplant of whole organ as the cause of abnormal reaction of the patient, or of later complication, without mention of misadventure at the time of the procedure: Secondary | ICD-10-CM | POA: Diagnosis present

## 2022-07-21 DIAGNOSIS — T8612 Kidney transplant failure: Secondary | ICD-10-CM | POA: Diagnosis present

## 2022-07-21 DIAGNOSIS — M86652 Other chronic osteomyelitis, left thigh: Secondary | ICD-10-CM | POA: Diagnosis present

## 2022-07-21 DIAGNOSIS — Z803 Family history of malignant neoplasm of breast: Secondary | ICD-10-CM

## 2022-07-21 DIAGNOSIS — I120 Hypertensive chronic kidney disease with stage 5 chronic kidney disease or end stage renal disease: Secondary | ICD-10-CM | POA: Diagnosis present

## 2022-07-21 DIAGNOSIS — D696 Thrombocytopenia, unspecified: Secondary | ICD-10-CM | POA: Diagnosis present

## 2022-07-21 DIAGNOSIS — A419 Sepsis, unspecified organism: Secondary | ICD-10-CM | POA: Diagnosis present

## 2022-07-21 DIAGNOSIS — Z79899 Other long term (current) drug therapy: Secondary | ICD-10-CM

## 2022-07-21 DIAGNOSIS — I890 Lymphedema, not elsewhere classified: Secondary | ICD-10-CM | POA: Diagnosis present

## 2022-07-21 DIAGNOSIS — T8619 Other complication of kidney transplant: Principal | ICD-10-CM | POA: Diagnosis present

## 2022-07-21 DIAGNOSIS — D84821 Immunodeficiency due to drugs: Secondary | ICD-10-CM | POA: Diagnosis present

## 2022-07-21 DIAGNOSIS — R6510 Systemic inflammatory response syndrome (SIRS) of non-infectious origin without acute organ dysfunction: Secondary | ICD-10-CM | POA: Diagnosis present

## 2022-07-21 DIAGNOSIS — Z808 Family history of malignant neoplasm of other organs or systems: Secondary | ICD-10-CM

## 2022-07-21 DIAGNOSIS — Z8249 Family history of ischemic heart disease and other diseases of the circulatory system: Secondary | ICD-10-CM

## 2022-07-21 DIAGNOSIS — Z8544 Personal history of malignant neoplasm of other female genital organs: Secondary | ICD-10-CM

## 2022-07-21 DIAGNOSIS — K219 Gastro-esophageal reflux disease without esophagitis: Secondary | ICD-10-CM | POA: Diagnosis present

## 2022-07-21 DIAGNOSIS — I10 Essential (primary) hypertension: Secondary | ICD-10-CM | POA: Diagnosis present

## 2022-07-21 DIAGNOSIS — R21 Rash and other nonspecific skin eruption: Secondary | ICD-10-CM | POA: Diagnosis present

## 2022-07-21 DIAGNOSIS — Z862 Personal history of diseases of the blood and blood-forming organs and certain disorders involving the immune mechanism: Secondary | ICD-10-CM

## 2022-07-21 DIAGNOSIS — N184 Chronic kidney disease, stage 4 (severe): Secondary | ICD-10-CM | POA: Diagnosis present

## 2022-07-21 DIAGNOSIS — M729 Fibroblastic disorder, unspecified: Secondary | ICD-10-CM | POA: Diagnosis present

## 2022-07-21 LAB — COMPREHENSIVE METABOLIC PANEL
ALT: 6 U/L (ref 0–44)
AST: 8 U/L — ABNORMAL LOW (ref 15–41)
Albumin: 2.7 g/dL — ABNORMAL LOW (ref 3.5–5.0)
Alkaline Phosphatase: 71 U/L (ref 38–126)
Anion gap: 19 — ABNORMAL HIGH (ref 5–15)
BUN: 143 mg/dL — ABNORMAL HIGH (ref 6–20)
CO2: 8 mmol/L — ABNORMAL LOW (ref 22–32)
Calcium: 6 mg/dL — CL (ref 8.9–10.3)
Chloride: 108 mmol/L (ref 98–111)
Creatinine, Ser: 11.56 mg/dL — ABNORMAL HIGH (ref 0.44–1.00)
GFR, Estimated: 4 mL/min — ABNORMAL LOW (ref >60)
Glucose, Bld: 96 mg/dL (ref 70–99)
Potassium: 3.6 mmol/L (ref 3.5–5.1)
Sodium: 135 mmol/L (ref 135–145)
Total Bilirubin: 0.5 mg/dL (ref 0.3–1.2)
Total Protein: 7.7 g/dL (ref 6.5–8.1)

## 2022-07-21 LAB — CBC WITH DIFFERENTIAL/PLATELET
Abs Immature Granulocytes: 0.24 10*3/uL — ABNORMAL HIGH (ref 0.00–0.07)
Basophils Absolute: 0 10*3/uL (ref 0.0–0.1)
Basophils Relative: 0 %
Eosinophils Absolute: 0.1 10*3/uL (ref 0.0–0.5)
Eosinophils Relative: 0 %
HCT: 25.3 % — ABNORMAL LOW (ref 36.0–46.0)
Hemoglobin: 8.1 g/dL — ABNORMAL LOW (ref 12.0–15.0)
Immature Granulocytes: 1 %
Lymphocytes Relative: 12 %
Lymphs Abs: 2.4 10*3/uL (ref 0.7–4.0)
MCH: 30.8 pg (ref 26.0–34.0)
MCHC: 32 g/dL (ref 30.0–36.0)
MCV: 96.2 fL (ref 80.0–100.0)
Monocytes Absolute: 0.8 10*3/uL (ref 0.1–1.0)
Monocytes Relative: 4 %
Neutro Abs: 16.4 10*3/uL — ABNORMAL HIGH (ref 1.7–7.7)
Neutrophils Relative %: 83 %
Platelets: 142 10*3/uL — ABNORMAL LOW (ref 150–400)
RBC: 2.63 MIL/uL — ABNORMAL LOW (ref 3.87–5.11)
RDW: 16.7 % — ABNORMAL HIGH (ref 11.5–15.5)
WBC: 19.9 10*3/uL — ABNORMAL HIGH (ref 4.0–10.5)
nRBC: 0.1 % (ref 0.0–0.2)

## 2022-07-21 LAB — LACTIC ACID, PLASMA
Lactic Acid, Venous: 0.7 mmol/L (ref 0.5–1.9)
Lactic Acid, Venous: 0.9 mmol/L (ref 0.5–1.9)

## 2022-07-21 LAB — APTT: aPTT: 40 seconds — ABNORMAL HIGH (ref 24–36)

## 2022-07-21 LAB — URINALYSIS, W/ REFLEX TO CULTURE (INFECTION SUSPECTED)
Bilirubin Urine: NEGATIVE
Glucose, UA: 50 mg/dL — AB
Ketones, ur: NEGATIVE mg/dL
Nitrite: NEGATIVE
Protein, ur: >=300 mg/dL — AB
Specific Gravity, Urine: 1.01 (ref 1.005–1.030)
pH: 5 (ref 5.0–8.0)

## 2022-07-21 LAB — PROTIME-INR
INR: 1.3 — ABNORMAL HIGH (ref 0.8–1.2)
Prothrombin Time: 16.3 seconds — ABNORMAL HIGH (ref 11.4–15.2)

## 2022-07-21 MED ORDER — METRONIDAZOLE 500 MG/100ML IV SOLN
500.0000 mg | Freq: Once | INTRAVENOUS | Status: AC
  Administered 2022-07-22: 500 mg via INTRAVENOUS
  Filled 2022-07-21: qty 100

## 2022-07-21 MED ORDER — LACTATED RINGERS IV SOLN: INTRAVENOUS | Status: DC

## 2022-07-21 MED ORDER — ACETAMINOPHEN 500 MG PO TABS
1000.0000 mg | ORAL_TABLET | Freq: Once | ORAL | Status: AC
  Administered 2022-07-22: 1000 mg via ORAL
  Filled 2022-07-21: qty 2

## 2022-07-21 MED ORDER — SODIUM CHLORIDE 0.9 % IV SOLN
1.0000 g | INTRAVENOUS | Status: DC
  Administered 2022-07-21 – 2022-07-22 (×2): 1 g via INTRAVENOUS
  Filled 2022-07-21 (×2): qty 10

## 2022-07-21 MED ORDER — VANCOMYCIN HCL 1250 MG/250ML IV SOLN
1250.0000 mg | Freq: Once | INTRAVENOUS | Status: AC
  Administered 2022-07-22: 1250 mg via INTRAVENOUS
  Filled 2022-07-21: qty 250

## 2022-07-21 MED ORDER — VANCOMYCIN HCL IN DEXTROSE 1-5 GM/200ML-% IV SOLN: 1000.0000 mg | Freq: Once | INTRAVENOUS | Status: DC

## 2022-07-21 MED ORDER — VANCOMYCIN VARIABLE DOSE PER UNSTABLE RENAL FUNCTION (PHARMACIST DOSING): Status: DC

## 2022-07-21 MED ORDER — SODIUM CHLORIDE 0.9 % IV SOLN
1.0000 g | Freq: Once | INTRAVENOUS | Status: DC
  Filled 2022-07-21: qty 10

## 2022-07-21 NOTE — Progress Notes (Addendum)
Pharmacy Antibiotic Note  Christina Mack is a 57 y.o. female for which pharmacy has been consulted for cefepime and vancomycin dosing for sepsis.  Patient with a history of HTN, DVT, CKD s/p kidney transplants in 2001, 2014, lymphedema . Patient presenting with abnormal labs.  SCr 11.74 - ~7-8 baseline WBC 100.5; T 100.5; HR 103; RR 29  Plan: Cefepime 1g q24hr Vancomycin 1250 mg once, subsequent dosing as indicated per random vancomycin level until renal function stable and/or improved, at which time scheduled dosing can be considered Trend WBC, Fever, Renal function F/u cultures, clinical course, WBC De-escalate when able  Weight: 59.9 kg (132 lb)  Temp (24hrs), Avg:100.5 F (38.1 C), Min:100.5 F (38.1 C), Max:100.5 F (38.1 C)  Recent Labs  Lab 07/20/22 0933  CREATININE 11.74*    Estimated Creatinine Clearance: 4.9 mL/min (A) (by C-G formula based on SCr of 11.74 mg/dL (H)).    Allergies  Allergen Reactions   Oxycodone-Acetaminophen Itching, Nausea And Vomiting, Nausea Only, Rash and Other (See Comments)    Headaches, also    Piperacillin-Tazobactam In Dex     Other Reaction(s): Other (See Comments)  Thrombocytopenia  Thrombocytopenia   Zosyn [Piperacillin Sod-Tazobactam So] Other (See Comments)    Thrombocytopenia   Hydromorphone Nausea And Vomiting, Rash and Other (See Comments)   Amlodipine Other (See Comments) and Swelling    Ankle swelling   Codeine     vomiting   Hydralazine     Headache   Zyvox [Linezolid] Other (See Comments)    Decreased platelet counts   Microbiology results: Pending  Thank you for allowing pharmacy to be a part of this patient's care.  Christina Mack, PharmD, BCPS 07/21/2022 10:49 PM ED Clinical Pharmacist -  [phone]

## 2022-07-21 NOTE — ED Triage Notes (Signed)
POV/ sent by PCP/ BUN/CRT elevated/ unsure of value/ told to come for possible emergent dialysis/ pt c/o weakness and [facility]

## 2022-07-21 NOTE — ED Provider Triage Note (Signed)
Emergency Medicine Provider Triage Evaluation Note  Christina Mack , a 57 y.o. female  was evaluated in triage.  Pt complains of abnormal labs. Hx of chronic kidney disease receiving erythropoeitin injection.  Had labs done yesterday and kidney doctor recommend coming to the ER for emergent dialysis.  Pt report having chills.  No runny nose, sneeze, cough, cp, sob, abd pain, dysuria  Review of Systems  Positive: As above Negative: As above  Physical Exam  BP (!) 190/102 (BP Location: Left Arm)   Pulse (!) 108   Temp (!) 100.5 F (38.1 C) (Oral)   Resp (!) 22   Wt 59.9 kg   SpO2 100%   BMI 21.97 kg/m  Gen:   Awake, no distress   Resp:  Normal effort  MSK:   Moves extremities without difficulty  Other:    Medical Decision Making  Medically screening exam initiated at 9:34 PM.  Appropriate orders placed.  Christina Mack was informed that the remainder of the evaluation will be completed by another provider, this initial triage assessment does not replace that evaluation, and the importance of remaining in the ED until their evaluation is complete.  Code sepsis initiated.    Fayrene Helper, PA-C 07/21/22 2136

## 2022-07-21 NOTE — ED Provider Notes (Signed)
[place] EMERGENCY DEPARTMENT AT [facility] Provider Note   CSN: [id number] Arrival date & time: 07/21/22  2045     History {Add pertinent medical, surgical, social history, OB history to HPI:1} Chief Complaint  Patient presents with  . Abnormal Lab    Christina Mack is a 57 y.o. female.   Abnormal Lab         Home Medications Prior to Admission medications   Medication Sig Start Date End Date Taking? Authorizing Provider  acetaminophen (TYLENOL) 500 MG tablet Take 2 tablets (1,000 mg total) by mouth 3 (three) times daily. Patient taking differently: Take 1,000 mg by mouth every 6 (six) hours as needed for mild pain. 05/05/20  Yes Doran Stabler, DO  carvedilol (COREG) 12.5 MG tablet Take 2 tablets (25 mg total) by mouth 2 (two) times daily with a meal. 05/29/22  Yes Vann, Jessica U, DO  pantoprazole (PROTONIX) 40 MG tablet TAKE 1 TABLET (40 MG TOTAL) BY MOUTH 2 (TWO) TIMES DAILY BEFORE A MEAL. Patient taking differently: Take 40 mg by mouth 2 (two) times daily before a meal. 05/17/20 07/21/22 Yes Love, Evlyn Kanner, PA-C  predniSONE (DELTASONE) 5 MG tablet Take 5 mg by mouth daily with breakfast.   Yes [provider]  sodium bicarbonate 650 MG tablet Take 1 tablet (650 mg total) by mouth 3 (three) times daily. 03/25/22  Yes Noralee Stain, DO  tacrolimus (PROGRAF) 1 MG capsule Take 1 mg by mouth 2 (two) times daily. 04/12/22  Yes [provider]  VITAMIN D PO Take 1 tablet by mouth daily.   Yes [provider]  amoxicillin-clavulanate (AUGMENTIN) 500-125 MG tablet Take 1 tablet by mouth daily. Patient not taking: Reported on 07/21/2022 05/29/22   Joseph Art, DO  doxycycline (VIBRA-TABS) 100 MG tablet Take 1 tablet (100 mg total) by mouth every 12 (twelve) hours. Patient not taking: Reported on 07/21/2022 05/29/22   Joseph Art, DO      Allergies    Oxycodone-acetaminophen, Piperacillin-tazobactam in dex, Zosyn [piperacillin sod-tazobactam  so], Hydromorphone, Amlodipine, Codeine, Hydralazine, and Zyvox [linezolid]    Review of Systems   Review of Systems  Physical Exam Updated Vital Signs BP (!) 176/100   Pulse (!) 103   Temp (!) 100.5 F (38.1 C) (Oral)   Resp (!) 29   Wt 59.9 kg   SpO2 100%   BMI 21.97 kg/m  Physical Exam  ED Results / Procedures / Treatments   Labs (all labs ordered are listed, but only abnormal results are displayed) Labs Reviewed  CULTURE, BLOOD (ROUTINE X 2)  CULTURE, BLOOD (ROUTINE X 2)  RESP PANEL BY RT-PCR (RSV, FLU A&B, COVID)  RVPGX2  COMPREHENSIVE METABOLIC PANEL  CBC WITH DIFFERENTIAL/PLATELET  LACTIC ACID, PLASMA  LACTIC ACID, PLASMA  PROTIME-INR  APTT  URINALYSIS, W/ REFLEX TO CULTURE (INFECTION SUSPECTED)  TACROLIMUS LEVEL    EKG None  Radiology DG Chest Port 1 View  Result Date: 07/21/2022 CLINICAL DATA:  Questionable sepsis - evaluate for abnormality EXAM: PORTABLE CHEST 1 VIEW COMPARISON:  03/19/2022 FINDINGS: Heart and mediastinal contours are within normal limits. No focal opacities or effusions. No acute bony abnormality. IMPRESSION: No active disease. Electronically Signed   By: Charlett Nose M.D.   On: 07/21/2022 22:29    Procedures Procedures  {Document cardiac monitor, telemetry assessment procedure when appropriate:1}  Medications Ordered in ED Medications - No data to display  ED Course/ Medical Decision Making/ A&P   {   Click  here for ABCD2, HEART and other calculatorsREFRESH Note before signing :1}                          Medical Decision Making Amount and/or Complexity of Data Reviewed Labs: ordered.   ***  {Document critical care time when appropriate:1} {Document review of labs and clinical decision tools ie heart score, Chads2Vasc2 etc:1}  {Document your independent review of radiology images, and any outside records:1} {Document your discussion with family members, caretakers, and with consultants:1} {Document social determinants of  health affecting pt's care:1} {Document your decision making why or why not admission, treatments were needed:1} Final Clinical Impression(s) / ED Diagnoses Final diagnoses:  None    Rx / DC Orders ED Discharge Orders     None

## 2022-07-21 NOTE — ED Notes (Signed)
Ultrasound at bedside

## 2022-07-22 ENCOUNTER — Encounter (HOSPITAL_COMMUNITY): Payer: Self-pay | Admitting: Internal Medicine

## 2022-07-22 DIAGNOSIS — A419 Sepsis, unspecified organism: Secondary | ICD-10-CM | POA: Diagnosis present

## 2022-07-22 DIAGNOSIS — I10 Essential (primary) hypertension: Secondary | ICD-10-CM

## 2022-07-22 DIAGNOSIS — R531 Weakness: Secondary | ICD-10-CM

## 2022-07-22 DIAGNOSIS — A46 Erysipelas: Secondary | ICD-10-CM | POA: Diagnosis not present

## 2022-07-22 DIAGNOSIS — M609 Myositis, unspecified: Secondary | ICD-10-CM | POA: Diagnosis present

## 2022-07-22 DIAGNOSIS — Z923 Personal history of irradiation: Secondary | ICD-10-CM | POA: Diagnosis not present

## 2022-07-22 DIAGNOSIS — N189 Chronic kidney disease, unspecified: Secondary | ICD-10-CM

## 2022-07-22 DIAGNOSIS — M86652 Other chronic osteomyelitis, left thigh: Secondary | ICD-10-CM | POA: Diagnosis present

## 2022-07-22 DIAGNOSIS — K219 Gastro-esophageal reflux disease without esophagitis: Secondary | ICD-10-CM | POA: Diagnosis not present

## 2022-07-22 DIAGNOSIS — Z96641 Presence of right artificial hip joint: Secondary | ICD-10-CM | POA: Diagnosis present

## 2022-07-22 DIAGNOSIS — E8809 Other disorders of plasma-protein metabolism, not elsewhere classified: Secondary | ICD-10-CM | POA: Diagnosis present

## 2022-07-22 DIAGNOSIS — N184 Chronic kidney disease, stage 4 (severe): Secondary | ICD-10-CM

## 2022-07-22 DIAGNOSIS — D631 Anemia in chronic kidney disease: Secondary | ICD-10-CM | POA: Diagnosis present

## 2022-07-22 DIAGNOSIS — D696 Thrombocytopenia, unspecified: Secondary | ICD-10-CM | POA: Diagnosis present

## 2022-07-22 DIAGNOSIS — I890 Lymphedema, not elsewhere classified: Secondary | ICD-10-CM | POA: Diagnosis present

## 2022-07-22 DIAGNOSIS — E876 Hypokalemia: Secondary | ICD-10-CM | POA: Diagnosis present

## 2022-07-22 DIAGNOSIS — I120 Hypertensive chronic kidney disease with stage 5 chronic kidney disease or end stage renal disease: Secondary | ICD-10-CM | POA: Diagnosis present

## 2022-07-22 DIAGNOSIS — Z79899 Other long term (current) drug therapy: Secondary | ICD-10-CM | POA: Diagnosis not present

## 2022-07-22 DIAGNOSIS — L03116 Cellulitis of left lower limb: Secondary | ICD-10-CM | POA: Diagnosis present

## 2022-07-22 DIAGNOSIS — D84821 Immunodeficiency due to drugs: Secondary | ICD-10-CM | POA: Diagnosis present

## 2022-07-22 DIAGNOSIS — N179 Acute kidney failure, unspecified: Secondary | ICD-10-CM | POA: Diagnosis present

## 2022-07-22 DIAGNOSIS — E871 Hypo-osmolality and hyponatremia: Secondary | ICD-10-CM | POA: Diagnosis present

## 2022-07-22 DIAGNOSIS — Z8544 Personal history of malignant neoplasm of other female genital organs: Secondary | ICD-10-CM | POA: Diagnosis not present

## 2022-07-22 DIAGNOSIS — T8619 Other complication of kidney transplant: Secondary | ICD-10-CM | POA: Diagnosis present

## 2022-07-22 DIAGNOSIS — T8612 Kidney transplant failure: Secondary | ICD-10-CM | POA: Diagnosis present

## 2022-07-22 DIAGNOSIS — Y830 Surgical operation with transplant of whole organ as the cause of abnormal reaction of the patient, or of later complication, without mention of misadventure at the time of the procedure: Secondary | ICD-10-CM | POA: Diagnosis present

## 2022-07-22 DIAGNOSIS — M79662 Pain in left lower leg: Secondary | ICD-10-CM | POA: Diagnosis not present

## 2022-07-22 DIAGNOSIS — Z1152 Encounter for screening for COVID-19: Secondary | ICD-10-CM | POA: Diagnosis not present

## 2022-07-22 DIAGNOSIS — N186 End stage renal disease: Secondary | ICD-10-CM | POA: Diagnosis present

## 2022-07-22 DIAGNOSIS — R229 Localized swelling, mass and lump, unspecified: Secondary | ICD-10-CM | POA: Diagnosis not present

## 2022-07-22 DIAGNOSIS — R6510 Systemic inflammatory response syndrome (SIRS) of non-infectious origin without acute organ dysfunction: Secondary | ICD-10-CM

## 2022-07-22 DIAGNOSIS — M8660 Other chronic osteomyelitis, unspecified site: Secondary | ICD-10-CM | POA: Diagnosis not present

## 2022-07-22 DIAGNOSIS — Z862 Personal history of diseases of the blood and blood-forming organs and certain disorders involving the immune mechanism: Secondary | ICD-10-CM

## 2022-07-22 DIAGNOSIS — E872 Acidosis, unspecified: Secondary | ICD-10-CM | POA: Diagnosis present

## 2022-07-22 LAB — RESP PANEL BY RT-PCR (RSV, FLU A&B, COVID)  RVPGX2
Influenza A by PCR: NEGATIVE
Influenza B by PCR: NEGATIVE
Resp Syncytial Virus by PCR: NEGATIVE
SARS Coronavirus 2 by RT PCR: NEGATIVE

## 2022-07-22 LAB — COMPREHENSIVE METABOLIC PANEL WITH GFR
ALT: 8 U/L (ref 0–44)
AST: 6 U/L — ABNORMAL LOW (ref 15–41)
Albumin: 2.3 g/dL — ABNORMAL LOW (ref 3.5–5.0)
Alkaline Phosphatase: 56 U/L (ref 38–126)
BUN: 140 mg/dL — ABNORMAL HIGH (ref 6–20)
CO2: <7 mmol/L — ABNORMAL LOW (ref 22–32)
Calcium: 5.7 mg/dL — CL (ref 8.9–10.3)
Chloride: 104 mmol/L (ref 98–111)
Creatinine, Ser: 11.74 mg/dL — ABNORMAL HIGH (ref 0.44–1.00)
GFR, Estimated: 3 mL/min — ABNORMAL LOW
Glucose, Bld: 139 mg/dL — ABNORMAL HIGH (ref 70–99)
Potassium: 2.9 mmol/L — ABNORMAL LOW (ref 3.5–5.1)
Sodium: 130 mmol/L — ABNORMAL LOW (ref 135–145)
Total Bilirubin: 0.8 mg/dL (ref 0.3–1.2)
Total Protein: 6.5 g/dL (ref 6.5–8.1)

## 2022-07-22 LAB — URINALYSIS, W/ REFLEX TO CULTURE (INFECTION SUSPECTED)
Bilirubin Urine: NEGATIVE
Glucose, UA: 50 mg/dL — AB
Ketones, ur: NEGATIVE mg/dL
Nitrite: NEGATIVE
Protein, ur: 100 mg/dL — AB
Specific Gravity, Urine: 1.008 (ref 1.005–1.030)
pH: 5 (ref 5.0–8.0)

## 2022-07-22 LAB — CBC WITH DIFFERENTIAL/PLATELET
Abs Immature Granulocytes: 0.25 K/uL — ABNORMAL HIGH (ref 0.00–0.07)
Basophils Absolute: 0 K/uL (ref 0.0–0.1)
Basophils Relative: 0 %
Eosinophils Absolute: 0.1 K/uL (ref 0.0–0.5)
Eosinophils Relative: 0 %
HCT: 21.7 % — ABNORMAL LOW (ref 36.0–46.0)
Hemoglobin: 7 g/dL — ABNORMAL LOW (ref 12.0–15.0)
Immature Granulocytes: 1 %
Lymphocytes Relative: 13 %
Lymphs Abs: 2.7 K/uL (ref 0.7–4.0)
MCH: 30.7 pg (ref 26.0–34.0)
MCHC: 32.3 g/dL (ref 30.0–36.0)
MCV: 95.2 fL (ref 80.0–100.0)
Monocytes Absolute: 0.9 K/uL (ref 0.1–1.0)
Monocytes Relative: 4 %
Neutro Abs: 16.2 K/uL — ABNORMAL HIGH (ref 1.7–7.7)
Neutrophils Relative %: 82 %
Platelets: 122 K/uL — ABNORMAL LOW (ref 150–400)
RBC: 2.28 MIL/uL — ABNORMAL LOW (ref 3.87–5.11)
RDW: 16.7 % — ABNORMAL HIGH (ref 11.5–15.5)
WBC: 20 K/uL — ABNORMAL HIGH (ref 4.0–10.5)
nRBC: 0 % (ref 0.0–0.2)

## 2022-07-22 LAB — MAGNESIUM: Magnesium: 0.9 mg/dL — CL (ref 1.7–2.4)

## 2022-07-22 LAB — CULTURE, BLOOD (ROUTINE X 2): Special Requests: ADEQUATE

## 2022-07-22 LAB — PROTEIN / CREATININE RATIO, URINE
Creatinine, Urine: 33 mg/dL
Protein Creatinine Ratio: 3.55 mg/mg{creat} — ABNORMAL HIGH (ref 0.00–0.15)
Total Protein, Urine: 117 mg/dL

## 2022-07-22 LAB — TSH: TSH: 1.77 u[IU]/mL (ref 0.350–4.500)

## 2022-07-22 LAB — PHOSPHORUS: Phosphorus: 9.7 mg/dL — ABNORMAL HIGH (ref 2.5–4.6)

## 2022-07-22 LAB — PROCALCITONIN: Procalcitonin: 5.94 ng/mL

## 2022-07-22 LAB — CREATININE, URINE, RANDOM: Creatinine, Urine: 33 mg/dL

## 2022-07-22 LAB — SODIUM, URINE, RANDOM: Sodium, Ur: 63 mmol/L

## 2022-07-22 LAB — TYPE AND SCREEN: ABO/RH(D): O POS

## 2022-07-22 LAB — VITAMIN B12: Vitamin B-12: 555 pg/mL (ref 180–914)

## 2022-07-22 MED ORDER — SODIUM BICARBONATE 650 MG PO TABS
1300.0000 mg | ORAL_TABLET | Freq: Three times a day (TID) | ORAL | Status: DC
  Administered 2022-07-22 – 2022-07-24 (×7): 1300 mg via ORAL
  Filled 2022-07-22 (×7): qty 2

## 2022-07-22 MED ORDER — CALCIUM GLUCONATE-NACL 2-0.675 GM/100ML-% IV SOLN
2.0000 g | Freq: Once | INTRAVENOUS | Status: AC
  Administered 2022-07-22: 2000 mg via INTRAVENOUS
  Filled 2022-07-22: qty 100

## 2022-07-22 MED ORDER — SODIUM BICARBONATE 8.4 % IV SOLN
INTRAVENOUS | Status: DC
  Filled 2022-07-22 (×4): qty 1000

## 2022-07-22 MED ORDER — MAGNESIUM SULFATE 2 GM/50ML IV SOLN
2.0000 g | Freq: Once | INTRAVENOUS | Status: AC
  Administered 2022-07-22: 2 g via INTRAVENOUS
  Filled 2022-07-22: qty 50

## 2022-07-22 MED ORDER — LACTATED RINGERS IV SOLN: INTRAVENOUS | Status: DC

## 2022-07-22 MED ORDER — ACETAMINOPHEN 650 MG RE SUPP: 650.0000 mg | Freq: Four times a day (QID) | RECTAL | Status: DC | PRN

## 2022-07-22 MED ORDER — PANTOPRAZOLE SODIUM 40 MG PO TBEC
40.0000 mg | DELAYED_RELEASE_TABLET | Freq: Two times a day (BID) | ORAL | Status: DC
  Administered 2022-07-22 – 2022-07-27 (×11): 40 mg via ORAL
  Filled 2022-07-22 (×11): qty 1

## 2022-07-22 MED ORDER — ONDANSETRON HCL 4 MG/2ML IJ SOLN
4.0000 mg | Freq: Four times a day (QID) | INTRAMUSCULAR | Status: DC | PRN
  Administered 2022-07-24 – 2022-07-25 (×3): 4 mg via INTRAVENOUS
  Filled 2022-07-22 (×3): qty 2

## 2022-07-22 MED ORDER — ACETAMINOPHEN 325 MG PO TABS
650.0000 mg | ORAL_TABLET | Freq: Four times a day (QID) | ORAL | Status: DC | PRN
  Administered 2022-07-23 – 2022-07-27 (×6): 650 mg via ORAL
  Filled 2022-07-22 (×8): qty 2

## 2022-07-22 MED ORDER — PREDNISONE 5 MG PO TABS
5.0000 mg | ORAL_TABLET | Freq: Every day | ORAL | Status: DC
  Administered 2022-07-22 – 2022-07-27 (×6): 5 mg via ORAL
  Filled 2022-07-22 (×6): qty 1

## 2022-07-22 MED ORDER — SODIUM BICARBONATE 650 MG PO TABS: 650.0000 mg | ORAL_TABLET | Freq: Three times a day (TID) | ORAL | Status: DC

## 2022-07-22 MED ORDER — TACROLIMUS 1 MG PO CAPS
1.0000 mg | ORAL_CAPSULE | Freq: Two times a day (BID) | ORAL | Status: DC
  Administered 2022-07-22 – 2022-07-27 (×11): 1 mg via ORAL
  Filled 2022-07-22 (×11): qty 1

## 2022-07-22 MED ORDER — MELATONIN 3 MG PO TABS
3.0000 mg | ORAL_TABLET | Freq: Every evening | ORAL | Status: DC | PRN
  Administered 2022-07-23 – 2022-07-24 (×2): 3 mg via ORAL
  Filled 2022-07-22 (×3): qty 1

## 2022-07-22 MED ORDER — CARVEDILOL 25 MG PO TABS
25.0000 mg | ORAL_TABLET | Freq: Two times a day (BID) | ORAL | Status: DC
  Administered 2022-07-22 – 2022-07-26 (×9): 25 mg via ORAL
  Filled 2022-07-22 (×9): qty 1
  Filled 2022-07-22: qty 2

## 2022-07-22 NOTE — Plan of Care (Signed)
  Problem: Clinical Measurements: Goal: Complications related to the disease process or treatment will be avoided or minimized Outcome: Progressing Goal: Dialysis access will remain free of complications Outcome: Progressing   

## 2022-07-22 NOTE — Consult Note (Signed)
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 Marisue Humble. Progression to ESRD especially in the context of possible early uremic symptoms. Will do supportive care for now, plan for [facility] placement on Monday to start HD then (would prefer her to be fever free x 48 hrs). Discussed this in detail and overall she is now agreeable -renal txp u/s without acute findings -c/w home immunosuppression -Avoid nephrotoxic medications including NSAIDs and iodinated intravenous contrast exposure unless the latter is absolutely indicated.  Preferred narcotic agents for pain control are hydromorphone, fentanyl, and methadone. Morphine should not be used. Avoid Baclofen and avoid oral sodium phosphate and magnesium citrate based laxatives / bowel preps. Continue strict Input and Output monitoring. Will monitor the patient closely with you and intervene or adjust therapy as indicated by changes in clinical status/labs   Immunosuppression Management: -home IS: Tacrolimus  BID, prednisone 5 mg daily. Target FK goal 5-7 High Risk Medical Decision Making For Drug Therapy Requiring Intensive Monitoring For Toxicity I/S levels: tacrolimus trough to be drawn 4/21 Will continue intensive monitoring for drug levels and toxicity via regularly scheduled laboratory assays given the narrow therapeutic index of the immunosuppressive drug therapy listed above  Hypocalcemia -cal gluc 2g today, replete prn  Hypomag -replete prn  Metabolic acidosis -lactate WNL -starting nahco3 gtt 75cc/hr  SIRS -agree with broad spec abx--per primary. 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 Cultures pending. If proceeding with HD, then would prefer a TDC/fever-free x 48 hrs  HTN -resume home anti-HTNs  Anemia of CKD, thrombocytopenia -transfuse prn for Hgb <7.  Last dose of  Retacrit 20,000 units was on 4/18, not due for ESA yet.  Would avoid IV Fe in the context of her needing empiric antibiotics  Anthony Sar [facility] 07/22/2022 6:07 AM   _____________________________________________________________________________________   History of Present Illness: Christina Mack is a/an 57 y.o. female with a past medical history of CKD 5, renal transplant (last transplant DDKT 2014, followed by Dr. Marisue Humble in [place]), anemia of CKD, hypertension, history of osteomyelitis of the pubis, vulvar cancer who presents to [facility] with generalized weakness x 1 week.  She does report to me that she had some mild shortness of breath.  She also reports nausea/vomiting and decreased appetite.  Otherwise denies any chest pain, worsening swelling, decreased urinary frequency, dysuria, hematuria, pain over transplant, dysgeusia, brain fog, intractable hiccups/pruritus.   Medications:  Current Facility-Administered Medications  Medication Dose Route Frequency Provider Last Rate Last Admin   acetaminophen (TYLENOL) tablet 650 mg  650 mg Oral Q6H PRN Howerter, Justin B, DO       Or   acetaminophen (TYLENOL) suppository 650 mg  650 mg Rectal Q6H PRN Howerter, Justin B, DO       calcium gluconate 2 g/ 100 mL sodium chloride IVPB  2 g Intravenous Once Anthony Sar, MD       carvedilol (COREG) tablet 25 mg  25 mg Oral BID WC Howerter, Justin B, DO       ceFEPIme (MAXIPIME) 1 g in sodium chloride 0.9 % 100 mL IVPB  1 g Intravenous Q24H Mardene Sayer, MD   Stopped at 07/21/22 2346   lactated ringers infusion   Intravenous Continuous Howerter, Justin B, DO 50 mL/hr at 07/22/22 0308 New Bag at 07/22/22 0308   melatonin tablet 3 mg  3 mg Oral QHS PRN  Howerter, Justin B, DO       ondansetron (ZOFRAN) injection 4 mg  4 mg Intravenous Q6H PRN Howerter, Justin B, DO       pantoprazole (PROTONIX) EC tablet 40 mg  40 mg Oral BID AC Howerter, Justin B, DO       predniSONE (DELTASONE) tablet  5 mg  5 mg Oral Q breakfast Howerter, Justin B, DO       sodium bicarbonate tablet 1,300 mg  1,300 mg Oral TID Anthony Sar, MD       tacrolimus (PROGRAF) capsule 1 mg  1 mg Oral BID Howerter, Justin B, DO   1 mg at 07/22/22 0305   vancomycin variable dose per unstable renal function (pharmacist dosing)   Does not apply See admin instructions Mardene Sayer, MD       Current Outpatient Medications  Medication Sig Dispense Refill   acetaminophen (TYLENOL) 500 MG tablet Take 2 tablets (1,000 mg total) by mouth 3 (three) times daily. (Patient taking differently: Take 1,000 mg by mouth every 6 (six) hours as needed for mild pain.) 30 tablet 0   carvedilol (COREG) 12.5 MG tablet Take 2 tablets (25 mg total) by mouth 2 (two) times daily with a meal.     pantoprazole (PROTONIX) 40 MG tablet TAKE 1 TABLET (40 MG TOTAL) BY MOUTH 2 (TWO) TIMES DAILY BEFORE A MEAL. (Patient taking differently: Take 40 mg by mouth 2 (two) times daily before a meal.) 60 tablet 0   predniSONE (DELTASONE) 5 MG tablet Take 5 mg by mouth daily with breakfast.     sodium bicarbonate 650 MG tablet Take 1 tablet (650 mg total) by mouth 3 (three) times daily. 90 tablet 0   tacrolimus (PROGRAF) 1 MG capsule Take 1 mg by mouth 2 (two) times daily.     VITAMIN D PO Take 1 tablet by mouth daily.     amoxicillin-clavulanate (AUGMENTIN) 500-125 MG tablet Take 1 tablet by mouth daily. (Patient not taking: Reported on 07/21/2022) 4 tablet 0   doxycycline (VIBRA-TABS) 100 MG tablet Take 1 tablet (100 mg total) by mouth every 12 (twelve) hours. (Patient not taking: Reported on 07/21/2022) 8 tablet 0     ALLERGIES Oxycodone-acetaminophen, Piperacillin-tazobactam in dex, Zosyn [piperacillin sod-tazobactam so], Hydromorphone, Amlodipine, Codeine, Hydralazine, and Zyvox [linezolid]  MEDICAL HISTORY Past Medical History:  Diagnosis Date   Complication of anesthesia    nausea and vomiting   DVT (deep venous thrombosis)    History of  osteomyelitis    Kidney transplant failure    Kidney transplant recipient    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SOCIAL HISTORY Social History   Socioeconomic History   Marital status: Married    Spouse name: Not on file   Number of children: Not on file   Years of education: Not on file   Highest education level: Not on file  Occupational History   Not on file  Tobacco Use   Smoking status: Never   Smokeless tobacco: Never  Vaping Use   Vaping Use: Never used  Substance and Sexual Activity   Alcohol use: Not Currently   Drug use: Never   Sexual activity: Not Currently  Other Topics Concern   Not on file  Social History Narrative   Not on file   Social Determinants of Health   Financial Resource Strain: Not on file  Food Insecurity: No Food Insecurity (05/27/2022)   Hunger Vital Sign    Worried About Running Out of Food  in the Last Year: Never true    Ran Out of Food in the Last Year: Never true  Transportation Needs: No Transportation Needs (05/27/2022)   PRAPARE - Administrator, Civil Service (Medical): No    Lack of Transportation (Non-Medical): No  Physical Activity: Not on file  Stress: Not on file  Social Connections: Not on file  Intimate Partner Violence: Not At Risk (05/27/2022)   Humiliation, Afraid, Rape, and Kick questionnaire    Fear of Current or Ex-Partner: No    Emotionally Abused: No    Physically Abused: No    Sexually Abused: No     FAMILY HISTORY Family History  Problem Relation Age of Onset   Cerebral aneurysm Mother    AAA (abdominal aortic aneurysm) Brother    Breast cancer Paternal Aunt    Heart attack Maternal Grandmother    Brain cancer Maternal Grandfather    Colon cancer Neg Hx    Ovarian cancer Neg Hx    Endometrial cancer Neg Hx    Pancreatic cancer Neg Hx    Prostate cancer Neg Hx        Review of Systems: 12 systems reviewed Otherwise as per HPI, all other systems reviewed and negative  Physical Exam: Vitals:    07/22/22 0200 07/22/22 0500  BP: (!) 140/77 (!) 155/89  Pulse: 91 90  Resp: 20 19  Temp:  98.2 F (36.8 C)  SpO2: 100% 100%   No intake/output data recorded. No intake or output data in the 24 hours ending 07/22/22 0607 General: well-appearing, no acute distress HEENT: anicteric sclera, oropharynx clear without lesions, dry MM CV: regular rate, normal rhythm, no murmurs, no gallops, no rubs,  Lungs: clear to auscultation bilaterally, normal work of breathing Abd: soft, non-tender, non-distended Skin: no wounds seen Psych: alert, engaged, appropriate mood and affect Musculoskeletal: + edema b/l LEs Neuro: normal speech, no gross focal deficits, slightly tremulous but no asterixis and/or myoclonic jerking Dialysis access: none  Test Results Reviewed Lab Results  Component Value Date   NA 130 (L) 07/22/2022   K 2.9 (L) 07/22/2022   CL 104 07/22/2022   CO2 <7 (L) 07/22/2022   BUN 140 (H) 07/22/2022   CREATININE 11.74 (H) 07/22/2022   CALCIUM 5.7 (LL) 07/22/2022   ALBUMIN 2.3 (L) 07/22/2022   PHOS 9.7 (H) 07/22/2022     I have reviewed all relevant outside healthcare records related to the patient's kidney injury.
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
## 2022-07-22 NOTE — Progress Notes (Signed)
This is a no charge note, for further details, please see H&P by my partner Dr. Arlean Hopping from [facility] today.    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  Possible infection, will continue empiric antibiotics 48 hours and d/c if cultures negative.  Nephrology anticipate HD.  Hypocalcemia being treated.

## 2022-07-22 NOTE — ED Notes (Signed)
MD paged regarding blood work

## 2022-07-22 NOTE — H&P (Signed)
History and Physical      Christina Mack:[id number] DOB: [birth year] DOA: 07/21/2022  PCP: System, Provider Not In (will further assess) Patient coming from: home   I have personally briefly reviewed patient's old medical records in [facility]  Chief Complaint: WEAKNESS  HPI: Christina Mack is a 57 y.o. 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   The patient reports 1 week of generalized weakness in the absence of any acute focal weakness.  Denies any associated subjective fever, chills, rigors, or generalized myalgias.  She has noted over the last 4 to 5 days some rhinitis, rhinorrhea as well as nonproductive cough in the absence of any associated shortness of breath or wheezing.  Not associate with any recent chest pain, palpitations, diaphoresis, nausea, vomiting, diarrhea, abdominal pain, rash.  She also denies any headache or neck stiffness.  She notes that she continues to produce urine, and denies any recent dysuria or gross hematuria.  She has a history of renal transplant in 2001, with ensuing failure of that initial renal transplant followed by a new renal transplant 2014.  She is on chronic antirejection therapy in the form of tacrolimus as well as daily prednisone.  She has a history of osteomyelitis of the pubis having completed a course of Augmentin and doxycycline in February and March.  She followed up with general surgery earlier on 07/21/2022, who reportedly found that her clinical exam to be reassuring from an osteomyelitis standpoint.     ED Course:  Vital signs in the ED were notable for the following: Temperature max 100.5;  heart rate 100 108; systolic blood pressures in the 160s to 170s; respiratory rate 22-28; 100% on room air  Labs were notable for the following: CMP notable for the following: Sodium 135, potassium 3.6, bicarbonate 8, anion gap 19, BUN 143, creatinine 11.56 compared to most recent prior value of 8.09 on 06/23/2022, calcium, adjusted for mild hypoalbuminemia noted to be 2.7, otherwise, liver enzymes within normal limits.  Lactic acid 0.7, with repeat value noted to be 0.9.  CBC notable for evidence of count 19,900 with 83% neutrophils, relative to his recent prior white blood cell count of 8200 and February 2024, hemoglobin 8.1 consistent with normocytic/recurrent properties, platelet count 142.  Urinalysis notable for 6-10 white blood cells, nitrate negative, 11-20 RBCs, greater than 300 protein.  Tacrolimus level is currently pending.  Blood cultures x 2 were collected prior to initiation of IV antibiotics.  Per my interpretation, EKG in ED demonstrated the following: Sinus tachycardia with heart rate 103, normal intervals, no evidence of T wave or ST changes clinically no evidence of ST elevation.  Imaging and additional notable ED work-up: Chest ray, 1 view, performed radiology read, shows no evidence of acute cardiopulmonary process including evidence of ventricular edema, effusion, or pneumothorax.  Additionally, renal ultrasound, per radiology read, shows mild increase in cortical echogenicity of the right lower quadrant transplanted kidney, representing a nonspecific finding, will also demonstrating no evidence of hydronephrosis.  EDP discussed patient's case with the on-call nephrologist, Dr. Thedore Mins, Who recommended [facility] admission to [facility], and conveyed that he will formally consult and see the patient in the morning.  He  conveyed the possibility of the patient requiring initiation of hemodialysis during his hospitalization.  In terms of the patient's hypocalcemia, Dr. Thedore Mins also recommended repeat  serum calcium level in the morning, without interval calcium supplementation.  While in the ED, the following were administered: Acetaminophen 1 g p.o. x 1 dose, cefepime, IV Flagyl, IV vancomycin.  Subsequently, the patient was admitted for further evaluation management of acute renal failure superimposed on CKD stage V, presenting with generalized weakness, with presentation also notable for the presence of SIRS criteria as well as labs notable for hypocalcemia.     Review of Systems: As per HPI otherwise 10 point review of systems negative.   Past Medical History:  Diagnosis Date   Complication of anesthesia    nausea and vomiting   DVT (deep venous thrombosis)    History of osteomyelitis    Kidney transplant failure    Kidney transplant recipient    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Past Surgical History:  Procedure Laterality Date   APPLICATION OF WOUND VAC Right 01/22/2022   Procedure: APPLICATION OF WOUND VAC;  Surgeon: Tarry Kos, MD;  Location: [facility] OR;  Service: Orthopedics;  Laterality: Right;   BIOPSY  04/24/2020   Procedure: BIOPSY;  Surgeon: Sherrilyn Rist, MD;  Location: [facility] ENDOSCOPY;  Service: Gastroenterology;;   BIOPSY  05/27/2022   Procedure: BIOPSY;  Surgeon: Tressia Danas, MD;  Location[facility] ENDOSCOPY;  Service: Gastroenterology;;   COLONOSCOPY WITH PROPOFOL N/A 05/27/2022   Procedure: COLONOSCOPY WITH PROPOFOL;  Surgeon: Tressia Danas, MD;  Location: [facility] ENDOSCOPY;  Service: Gastroenterology;  Laterality: N/A;   ESOPHAGOGASTRODUODENOSCOPY (EGD) WITH PROPOFOL N/A 04/24/2020   Procedure: ESOPHAGOGASTRODUODENOSCOPY (EGD) WITH PROPOFOL;  Surgeon: Sherrilyn Rist, MD;  Location: [facility] ENDOSCOPY;  Service: Gastroenterology;  Laterality: N/A;   ESOPHAGOGASTRODUODENOSCOPY (EGD) WITH PROPOFOL N/A 05/27/2022   Procedure: ESOPHAGOGASTRODUODENOSCOPY (EGD) WITH PROPOFOL;  Surgeon: Tressia Danas, MD;  Location: [facility] ENDOSCOPY;  Service: Gastroenterology;  Laterality: N/A;    GIVENS CAPSULE STUDY N/A 05/28/2022   Procedure: GIVENS CAPSULE STUDY;  Surgeon: Tressia Danas, MD;  Location: [facility] ENDOSCOPY;  Service: Gastroenterology;  Laterality: N/A;   IR FLUORO GUIDE CV LINE RIGHT  03/24/2022   IR PERC TUN PERIT CATH WO PORT S&I /IMAG  03/24/2022   IR REMOVAL TUN CV CATH W/O FL  05/08/2022   IR US GUIDE BX ASP/DRAIN  04/30/2020   IR US GUIDE VASC ACCESS RIGHT  03/24/2022   KIDNEY TRANSPLANT  2001   KIDNEY TRANSPLANT  2014   LEEP     ORIF TIBIA PLATEAU Right 01/22/2022   Procedure: OPEN REDUCTION INTERNAL FIXATION (ORIF) TIBIAL PLATEAU;  Surgeon: Cammy Copa, MD;  Location: [facility] OR;  Service: Orthopedics;  Laterality: Right;   TOTAL ABDOMINAL HYSTERECTOMY     ovaries left in situ   TOTAL HIP ARTHROPLASTY Right 01/22/2022   Procedure: TOTAL HIP ARTHROPLASTY ANTERIOR APPROACH;  Surgeon: Tarry Kos, MD;  Location: [facility] OR;  Service: Orthopedics;  Laterality: Right;    Social History:  reports that she has never smoked. She has never used smokeless tobacco. She reports that she does not currently use alcohol. She reports that she does not use drugs.   Allergies  Allergen Reactions   Oxycodone-Acetaminophen Itching, Nausea And Vomiting, Nausea Only, Rash and Other (See Comments)    Headaches, also    Piperacillin-Tazobactam In Dex     Other Reaction(s): Other (See Comments)  Thrombocytopenia  Thrombocytopenia   Zosyn [Piperacillin Sod-Tazobactam So] Other (  See Comments)    Thrombocytopenia   Hydromorphone Nausea And Vomiting, Rash and Other (See Comments)   Amlodipine Other (See Comments) and Swelling    Ankle swelling   Codeine     vomiting   Hydralazine     Headache   Zyvox [Linezolid] Other (See Comments)    Decreased platelet counts    Family History  Problem Relation Age of Onset   Cerebral aneurysm Mother    AAA (abdominal aortic aneurysm) Brother    Breast cancer Paternal Aunt    Heart attack Maternal Grandmother    Brain cancer  Maternal Grandfather    Colon cancer Neg Hx    Ovarian cancer Neg Hx    Endometrial cancer Neg Hx    Pancreatic cancer Neg Hx    Prostate cancer Neg Hx     Family history reviewed and not pertinent    Prior to Admission medications   Medication Sig Start Date End Date Taking? Authorizing Provider  acetaminophen (TYLENOL) 500 MG tablet Take 2 tablets (1,000 mg total) by mouth 3 (three) times daily. Patient taking differently: Take 1,000 mg by mouth every 6 (six) hours as needed for mild pain. 05/05/20  Yes Doran Stabler, DO  carvedilol (COREG) 12.5 MG tablet Take 2 tablets (25 mg total) by mouth 2 (two) times daily with a meal. 05/29/22  Yes Vann, Jessica U, DO  pantoprazole (PROTONIX) 40 MG tablet TAKE 1 TABLET (40 MG TOTAL) BY MOUTH 2 (TWO) TIMES DAILY BEFORE A MEAL. Patient taking differently: Take 40 mg by mouth 2 (two) times daily before a meal. 05/17/20 07/21/22 Yes Love, Evlyn Kanner, PA-C  predniSONE (DELTASONE) 5 MG tablet Take 5 mg by mouth daily with breakfast.   Yes [provider]  sodium bicarbonate 650 MG tablet Take 1 tablet (650 mg total) by mouth 3 (three) times daily. 03/25/22  Yes Noralee Stain, DO  tacrolimus (PROGRAF) 1 MG capsule Take 1 mg by mouth 2 (two) times daily. 04/12/22  Yes [provider]  VITAMIN D PO Take 1 tablet by mouth daily.   Yes [provider]  amoxicillin-clavulanate (AUGMENTIN) 500-125 MG tablet Take 1 tablet by mouth daily. Patient not taking: Reported on 07/21/2022 05/29/22   Joseph Art, DO  doxycycline (VIBRA-TABS) 100 MG tablet Take 1 tablet (100 mg total) by mouth every 12 (twelve) hours. Patient not taking: Reported on 07/21/2022 05/29/22   Joseph Art, DO     Objective    Physical Exam: Vitals:   07/21/22 2300 07/21/22 2315 07/21/22 2330 07/22/22 0000  BP: (!) 167/96  (!) 170/94 (!) 161/94  Pulse: (!) 102  (!) 101 (!) 102  Resp: (!) 28  (!) 22 (!) 23  Temp:  98.8 F (37.1 C)    TempSrc:      SpO2: 100%   100% 100%  Weight:        General: appears to be stated age; alert, oriented Skin: warm, dry, no rash Head:  AT/[place] Mouth:  Oral mucosa membranes appear dry, normal dentition Neck: supple; trachea midline Heart:  RRR; did not appreciate any M/R/G Lungs: CTAB, did not appreciate any wheezes, rales, or rhonchi Abdomen: + BS; soft, ND, NT Vascular: 2+ pedal pulses b/l; 2+ radial pulses b/l Extremities: no peripheral edema, no muscle wasting Neuro: strength and sensation intact in upper and lower extremities b/l     Labs on Admission: I have personally reviewed following labs and imaging studies  CBC: Recent Labs  Lab 07/20/22 [phone]  07/21/22 2129  WBC  --  19.9*  NEUTROABS  --  16.4*  HGB 7.2* 8.1*  HCT  --  25.3*  MCV  --  96.2  PLT  --  142*   Basic Metabolic Panel: Recent Labs  Lab 07/20/22 0933 07/21/22 2129  NA 136 135  K 3.2* 3.6  CL 110 108  CO2 8* 8*  GLUCOSE 136* 96  BUN 138* 143*  CREATININE 11.74* 11.56*  CALCIUM 6.1* 6.0*  PHOS 11.5*  --    GFR: Estimated Creatinine Clearance: 4.9 mL/min (A) (by C-G formula based on SCr of 11.56 mg/dL (H)). Liver Function Tests: Recent Labs  Lab 07/20/22 0933 07/21/22 2129  AST  --  8*  ALT  --  6  ALKPHOS  --  71  BILITOT  --  0.5  PROT  --  7.7  ALBUMIN 2.6* 2.7*   No results for input(s): "LIPASE", "AMYLASE" in the last 168 hours. No results for input(s): "AMMONIA" in the last 168 hours. Coagulation Profile: Recent Labs  Lab 07/21/22 2157  INR 1.3*   Cardiac Enzymes: No results for input(s): "CKTOTAL", "CKMB", "CKMBINDEX", "TROPONINI" in the last 168 hours. BNP (last 3 results) No results for input(s): "PROBNP" in the last 8760 hours. HbA1C: No results for input(s): "HGBA1C" in the last 72 hours. CBG: No results for input(s): "GLUCAP" in the last 168 hours. Lipid Profile: No results for input(s): "CHOL", "HDL", "LDLCALC", "TRIG", "CHOLHDL", "LDLDIRECT" in the last 72 hours. Thyroid Function  Tests: No results for input(s): "TSH", "T4TOTAL", "FREET4", "T3FREE", "THYROIDAB" in the last 72 hours. Anemia Panel: Recent Labs    07/20/22 0933  TIBC 118*  IRON 84   Urine analysis:    Component Value Date/Time   COLORURINE YELLOW 07/21/2022 2159   APPEARANCEUR HAZY (A) 07/21/2022 2159   LABSPEC 1.010 07/21/2022 2159   PHURINE 5.0 07/21/2022 2159   GLUCOSEU 50 (A) 07/21/2022 2159   HGBUR MODERATE (A) 07/21/2022 2159   BILIRUBINUR NEGATIVE 07/21/2022 2159   BILIRUBINUR negative 11/02/2020 1037   KETONESUR NEGATIVE 07/21/2022 2159   PROTEINUR >=300 (A) 07/21/2022 2159   UROBILINOGEN 0.2 11/02/2020 1037   NITRITE NEGATIVE 07/21/2022 2159   LEUKOCYTESUR SMALL (A) 07/21/2022 2159    Radiological Exams on Admission: US Renal Transplant w/Doppler  Result Date: 07/22/2022 CLINICAL DATA:  Renal transplant, rejection EXAM: ULTRASOUND OF RENAL TRANSPLANT WITH RENAL DOPPLER ULTRASOUND TECHNIQUE: Ultrasound examination of the renal transplant was performed with gray-scale, color and duplex doppler evaluation. COMPARISON:  None Available. FINDINGS: Transplant kidney location: Right lower quadrant Transplant Kidney: Renal measurements: 12.0 x 4.8 x 5.0 cm = volume: . Renal cortical thickness is preserved, however, cortical echogenicity is diffusely, mildly increased. No intrarenal mass or hydronephrosis. No peri-transplant fluid collection seen. Color flow in the main renal artery:  Present Color flow in the main renal vein:  Present Duplex Doppler Evaluation: Main Renal Artery Velocity: 168 cm/sec Main Renal Artery Resistive Index: 0.8 Venous waveform in main renal vein:  Present Intrarenal resistive index in upper pole:  0.64 (normal 0.6-0.8; equivocal 0.8-0.9; abnormal >= 0.9) Intrarenal resistive index in lower pole: 0.69 (normal 0.6-0.8; equivocal 0.8-0.9; abnormal >= 0.9) Bladder: Normal for degree of bladder distention. Other findings:  None. IMPRESSION: 1. Mildly increased cortical  echogenicity of the right lower quadrant transplant kidney, a nonspecific finding. This can be seen in the setting of medical renal disease. 2. Resolution of previously noted hydronephrosis. Electronically Signed   By: Helyn Numbers M.D.   On:  07/22/2022 00:15   DG Chest Port 1 View  Result Date: 07/21/2022 CLINICAL DATA:  Questionable sepsis - evaluate for abnormality EXAM: PORTABLE CHEST 1 VIEW COMPARISON:  03/19/2022 FINDINGS: Heart and mediastinal contours are within normal limits. No focal opacities or effusions. No acute bony abnormality. IMPRESSION: No active disease. Electronically Signed   By: Charlett Nose M.D.   On: 07/21/2022 22:29      Assessment/Plan   Principal Problem:   Acute renal failure superimposed on stage 4 chronic kidney disease Active Problems:   Essential hypertension   Generalized weakness   Hypocalcemia   SIRS (systemic inflammatory response syndrome)   GERD (gastroesophageal reflux disease)   History of anemia due to chronic kidney disease     #) Acute renal failure superimposed on CKD stage V: In the setting of history of stage V CKD with baseline creatinine 6-8 in the context of a history of most recent renal transplant in 2014 on chronic antirejection therapy, presenting creatinine noted to now be 11.56.  While presenting labs also show evidence of anion gap metabolic acidosis, serum potassium level is nonelevated, while there is no evidence of acute volume overload, and the patient continues to produce urine.  EDP discussed patient's case with on-call nephrology, Dr. Thedore Mins Who conveyed that he will formally consult and see the patient in the morning, with the possibility of ultimately needing hemodialysis during this hospitalization, but also conveying no urgent need for overnight hemodialysis at this point.   Presenting urinalysis with microscopy shows 6-10 white blood cells, 11-20 RBCs and greater than 300 protein.  Renal ultrasound, with results as  conveyed above, including no evidence of postrenal obstructive contribution.   Plan: monitor strict I's & O's and daily weights. Attempt to avoid nephrotoxic agents. Refrain from NSAIDs. Repeat CMP in the morning. Check serum magnesium level. Add-on random urine sodium and random urine creatinine.  add-on random urine protein/cr ratio.  Nephrology consulted, as above.  Gentle IV fluids of lactated Ringer's at 50 cc/h x 8 hours.  Check serum phosphorus level.  Follow-up for tacrolimus level.  Continue outpatient tacrolimus and low-dose daily prednisone therapy.               #) Generalized weakness: 1 week duration of generalized weakness, in the absence of any evidence of acute focal neurologic deficits, including no evidence of acute focal weakness to suggest acute CVA.  Suspect contribution from physiologic stress stemming from presenting acute renal failure superimposed on CKD stage V.  No overt e/o of underlying infectious process at this time, including urinalysis that was not consistent with UTI, while chest x-ray shows no evidence of acute cardiopulmonary process, including no evidence of infiltrate.  However, will continue to pursue infectious workup, given the patient's increased risk for infection in the setting of her chronic immunosuppressive therapy.  Will further eval for any additional contributions from endocrine/metabolic sources, as detailed below.    Plan: work-up and management of presenting acute renal failure superimposed on CKD stage V, as described above. PT/OT consults ordered for the AM. Fall precautions. CMP/CBC in the AM. Check TSH, serum Mg level. Check B12 level.  Further evaluation management of presenting SIRS criteria, as further detailed below.               #) Hypocalcemia: Calcium, adjusted for hypoalbuminemia noted to be 7.0.  This laboratory abnormality was discussed by EDP with on-call nephrology, as above, who recommended repeat serum  calcium level in the morning, without associated  recommendation for interval calcium supplementation, as further detailed above.  Plan: Repeat CMP in the morning.  Check serum magnesium and phosphorus levels.  Nephrology to formally consult, as above.              #) SIRS criteria present: Presentation associated with interval development of leukocytosis, tachycardia, tachypnea.  However, in the absence of e/o underlying infection at this time, as further detailed above, criteria for sepsis not currently met.  Given increased risk for underlying infection in the setting of chronic immunosuppressive therapy, will pursue further infectious evaluation, and continued the IV vancomycin and cefepime that were initiated in the ED, with potential for ensuing de-escalation/discontinuation of IV biotics of it assuming evaluation does not declare underlying infectious process.  It is also possible that there may be a acute viral process given report of recent rhinitis/rhinorrhea and nonproductive cough, while noting the chest x-ray shows no evidence of acute cardiopulmonary process.  Will also add on procalcitonin level.  Additionally, given the patient's history of vulvar cancer, for which she follows with [facility], there may be a malignancy contribution towards her mildly elevated initial temperature as well.  Of note, blood cultures x 2 were collected in the ED today prior to initiation of IV vancomycin, cefepime, and Flagyl.  She appears hemodynamically stable at this time.  Lactic acid x 2 were found to be nonelevated.   Plan: Repeat CBC with diff in the AM.  Monitor strict I's and O's and daily weights.  Monitor on telemetry.  Check procalcitonin level.  Follow-up results of blood cultures x 2.  For now, we will continue the IV vancomycin and cefepime that were initiated in the ED, as further detailed above.  Monitor on telemetry.              #) Essential Hypertension: documented h/o  such, with outpatient antihypertensive regimen including Coreg.  SBP's in the ED today: 160s to 170s mmHg.   Plan: Close monitoring of subsequent BP via routine VS. resume home Coreg.                #) GERD: documented h/o such; on Protonix as outpatient.   Plan: continue home PPI.                 #) Anemia of chronic kidney disease: Documented history of such, a/w with baseline hgb range 7-8, with presenting hgb consistent with this range, in the absence of any overt evidence of active bleed.     Plan: Repeat CBC in the morning.  Check INR.  Type and screen ordered.  Further evaluation management of acute renal failure superimposed on CKD stage V, as above.      DVT prophylaxis: SCD's   Code Status: Full code Family Communication: none Disposition Plan: Per Rounding Team Consults called: EDP discussed patient's case with the on-call nephrologist, Dr. Thedore Mins, Who conveyed that he will formally consult and see the patient in the morning, as further detailed above;  Admission status: Inpatient     I SPENT GREATER THAN 75  MINUTES IN CLINICAL CARE TIME/MEDICAL DECISION-MAKING IN COMPLETING THIS ADMISSION.      Chaney Born Gwen Edler DO [facility]  From 7PM - 7AM   07/22/2022, 2:17 AM

## 2022-07-23 ENCOUNTER — Inpatient Hospital Stay (HOSPITAL_COMMUNITY): Payer: Medicare Other

## 2022-07-23 DIAGNOSIS — M8660 Other chronic osteomyelitis, unspecified site: Secondary | ICD-10-CM

## 2022-07-23 DIAGNOSIS — A419 Sepsis, unspecified organism: Secondary | ICD-10-CM | POA: Diagnosis not present

## 2022-07-23 DIAGNOSIS — E876 Hypokalemia: Secondary | ICD-10-CM | POA: Insufficient documentation

## 2022-07-23 DIAGNOSIS — C519 Malignant neoplasm of vulva, unspecified: Secondary | ICD-10-CM

## 2022-07-23 DIAGNOSIS — I10 Essential (primary) hypertension: Secondary | ICD-10-CM | POA: Diagnosis not present

## 2022-07-23 DIAGNOSIS — N179 Acute kidney failure, unspecified: Secondary | ICD-10-CM | POA: Diagnosis not present

## 2022-07-23 LAB — RENAL FUNCTION PANEL
Albumin: 2.1 g/dL — ABNORMAL LOW (ref 3.5–5.0)
Anion gap: 22 — ABNORMAL HIGH (ref 5–15)
BUN: 140 mg/dL — ABNORMAL HIGH (ref 6–20)
CO2: 8 mmol/L — ABNORMAL LOW (ref 22–32)
Calcium: 6.4 mg/dL — CL (ref 8.9–10.3)
Chloride: 102 mmol/L (ref 98–111)
Creatinine, Ser: 11.74 mg/dL — ABNORMAL HIGH (ref 0.44–1.00)
GFR, Estimated: 3 mL/min — ABNORMAL LOW (ref >60)
Glucose, Bld: 126 mg/dL — ABNORMAL HIGH (ref 70–99)
Phosphorus: 9.4 mg/dL — ABNORMAL HIGH (ref 2.5–4.6)
Potassium: 2.6 mmol/L — CL (ref 3.5–5.1)
Sodium: 132 mmol/L — ABNORMAL LOW (ref 135–145)

## 2022-07-23 LAB — CBC
HCT: 19.6 % — ABNORMAL LOW (ref 36.0–46.0)
Hemoglobin: 6.5 g/dL — CL (ref 12.0–15.0)
MCH: 30.8 pg (ref 26.0–34.0)
MCHC: 33.2 g/dL (ref 30.0–36.0)
MCV: 92.9 fL (ref 80.0–100.0)
Platelets: 124 10*3/uL — ABNORMAL LOW (ref 150–400)
RBC: 2.11 MIL/uL — ABNORMAL LOW (ref 3.87–5.11)
RDW: 16.3 % — ABNORMAL HIGH (ref 11.5–15.5)
WBC: 11.9 10*3/uL — ABNORMAL HIGH (ref 4.0–10.5)
nRBC: 0 % (ref 0.0–0.2)

## 2022-07-23 LAB — HEMOGLOBIN AND HEMATOCRIT, BLOOD
HCT: 21.6 % — ABNORMAL LOW (ref 36.0–46.0)
Hemoglobin: 7.5 g/dL — ABNORMAL LOW (ref 12.0–15.0)

## 2022-07-23 LAB — TYPE AND SCREEN: Unit division: 0

## 2022-07-23 LAB — HEPATITIS B SURFACE ANTIGEN: Hepatitis B Surface Ag: NONREACTIVE

## 2022-07-23 LAB — PREPARE RBC (CROSSMATCH)

## 2022-07-23 LAB — CULTURE, BLOOD (ROUTINE X 2): Culture: NO GROWTH

## 2022-07-23 LAB — MAGNESIUM: Magnesium: 1.3 mg/dL — ABNORMAL LOW (ref 1.7–2.4)

## 2022-07-23 LAB — BPAM RBC
Blood Product Expiration Date: 202404262359
Unit Type and Rh: 5100

## 2022-07-23 LAB — POTASSIUM: Potassium: 2.7 mmol/L — CL (ref 3.5–5.1)

## 2022-07-23 MED ORDER — SODIUM CHLORIDE 0.9 % IV SOLN
1.0000 g | INTRAVENOUS | Status: DC
  Administered 2022-07-23 – 2022-07-26 (×4): 1 g via INTRAVENOUS
  Filled 2022-07-23 (×5): qty 10

## 2022-07-23 MED ORDER — SODIUM CHLORIDE 0.9 % IV SOLN
1.0000 g | INTRAVENOUS | Status: DC
  Administered 2022-07-23: 1 g via INTRAVENOUS
  Filled 2022-07-23: qty 10

## 2022-07-23 MED ORDER — VANCOMYCIN VARIABLE DOSE PER UNSTABLE RENAL FUNCTION (PHARMACIST DOSING): Status: DC

## 2022-07-23 MED ORDER — MAGNESIUM SULFATE 2 GM/50ML IV SOLN
2.0000 g | Freq: Once | INTRAVENOUS | Status: AC
  Administered 2022-07-23: 2 g via INTRAVENOUS
  Filled 2022-07-23: qty 50

## 2022-07-23 MED ORDER — POTASSIUM CHLORIDE CRYS ER 20 MEQ PO TBCR
40.0000 meq | EXTENDED_RELEASE_TABLET | Freq: Once | ORAL | Status: AC
  Administered 2022-07-23: 40 meq via ORAL
  Filled 2022-07-23: qty 2

## 2022-07-23 MED ORDER — CHLORHEXIDINE GLUCONATE CLOTH 2 % EX PADS
6.0000 | MEDICATED_PAD | Freq: Every day | CUTANEOUS | Status: DC
  Administered 2022-07-25 – 2022-07-26 (×2): 6 via TOPICAL

## 2022-07-23 MED ORDER — SODIUM CHLORIDE 0.9% IV SOLUTION: Freq: Once | INTRAVENOUS | Status: DC

## 2022-07-23 NOTE — Hospital Course (Signed)
Mrs. Lanni is a 57 y.o. 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

## 2022-07-23 NOTE — Assessment & Plan Note (Addendum)
Hgb down to 6.5 4/22, transfused 1 unit - Hgb threshold 7 - Aranesp per nephrology

## 2022-07-23 NOTE — Evaluation (Signed)
Physical Therapy Evaluation Patient Details Name: Christina Mack MRN: [id number] DOB: 11-27-1965 Today's Date: 07/23/2022  History of Present Illness  57 y/o F admitted to [facility] on 4/19 for weakness in setting of acute renal failure superimposed on CKD stage V. Plans for placement of temporary catheter and initiation of HD on 4/22. PMHx: renal transplant in 2014, CKD, HTN, anemia, osteomyelitis of the pubis  Clinical Impression  Pt presents today with pain in LLE when in dependent position, and generalized weakness and decreased activity tolerance. At baseline, pt is independent with all mobility, mobilizing well today but mild antalgic gait on LLE, utilizing IV pole for relief and minG for safety, preferring to only ambulate in the room due to mild fatigue from limited sleep last night. Pt will benefit from skilled acute PT to progress mobility and activity tolerance during admission, recommend no PT needs upon discharge, anticipate pt will progress mobility with medical management. Acute PT will continue to follow as appropriate.      Recommendations for follow up therapy are one component of a multi-disciplinary discharge planning process, led by the attending physician.  Recommendations may be updated based on patient status, additional functional criteria and insurance authorization.  Follow Up Recommendations       Assistance Recommended at Discharge Intermittent Supervision/Assistance  Patient can return home with the following  A little help with walking and/or transfers;Help with stairs or ramp for entrance    Equipment Recommendations None recommended by PT  Recommendations for Other Services       Functional Status Assessment Patient has had a recent decline in their functional status and demonstrates the ability to make significant improvements in function in a reasonable and predictable amount of time.     Precautions / Restrictions Precautions Precautions: Fall Precaution  Comments: low fall risk Restrictions Weight Bearing Restrictions: No      Mobility  Bed Mobility Overal bed mobility: Modified Independent             General bed mobility comments: increased time due to leg pain    Transfers Overall transfer level: Needs assistance Equipment used: None Transfers: Sit to/from Stand Sit to Stand: Min guard           General transfer comment: utilizing IV pole in standing for support    Ambulation/Gait Ambulation/Gait assistance: Min guard Gait Distance (Feet): 15 Feet (20 feet back from restroom due to washing hands) Assistive device: IV Pole Gait Pattern/deviations: Step-through pattern, Decreased stride length, Antalgic Gait velocity: decreased     General Gait Details: mildly antalgic gait on LLE improving with use of IV pole for assist, able to ambulate without IV pole but decreased step length and gait speed noted  Stairs            Wheelchair Mobility    Modified Rankin (Stroke Patients Only)       Balance Overall balance assessment: Needs assistance Sitting-balance support: No upper extremity supported, Feet supported Sitting balance-Leahy Scale: Good     Standing balance support: Single extremity supported, No upper extremity supported, During functional activity Standing balance-Leahy Scale: Fair Standing balance comment: able to stand and ambulate without AD but 1UE support on IV pole utilized for ambulation due to LLE pain                             Pertinent Vitals/Pain Pain Assessment Pain Assessment: Faces Faces Pain Scale: Hurts even more Pain Location: LLE  when hanging in dependent position Pain Descriptors / Indicators: Squeezing, Throbbing Pain Intervention(s): Monitored during session, Repositioned    Home Living Family/patient expects to be discharged to:: Private residence Living Arrangements: Spouse/significant other Available Help at Discharge: Family;Available  PRN/intermittently Type of Home: House Home Access: Stairs to enter Entrance Stairs-Rails: Right Entrance Stairs-Number of Steps: 4 Alternate Level Stairs-Number of Steps: 11 Home Layout: Two level Home Equipment: Agricultural consultant (2 wheels);Rollator (4 wheels);Cane - single point;Shower seat;BSC/3in1 Additional Comments: can stay on first floor, tub/shower w/ bench on first floor and walk in shower on second floor    Prior Function Prior Level of Function : Independent/Modified Independent;Working/employed;Driving             Mobility Comments: pt reports independence with mobility, drives, denies falls ADLs Comments: 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 MOD I for ADLs     Hand Dominance   Dominant Hand: Right    Extremity/Trunk Assessment   Upper Extremity Assessment Upper Extremity Assessment: Defer to OT evaluation    Lower Extremity Assessment Lower Extremity Assessment: LLE deficits/detail LLE Deficits / Details: sensation in-tact and able to move against gravity, no resistance applied due to pain while hanging dependently    Cervical / Trunk Assessment Cervical / Trunk Assessment: Normal  Communication   Communication: No difficulties  Cognition Arousal/Alertness: Awake/alert Behavior During Therapy: Flat affect Overall Cognitive Status: Within Functional Limits for tasks assessed                                 General Comments: A&Ox4        General Comments General comments (skin integrity, edema, etc.): no family present during session    Exercises     Assessment/Plan    PT Assessment Patient needs continued PT services  PT Problem List Decreased mobility;Decreased activity tolerance;Decreased balance       PT Treatment Interventions DME instruction;Gait training;Stair training;Functional mobility training;Therapeutic exercise;Therapeutic activities;Balance training;Neuromuscular  re-education;Patient/family education    PT Goals (Current goals can be found in the Care Plan section)  Acute Rehab PT Goals Patient Stated Goal: get better PT Goal Formulation: With patient Time For Goal Achievement: 08/06/22 Potential to Achieve Goals: Good    Frequency Min 3X/week     Co-evaluation               AM-PAC PT "6 Clicks" Mobility  Outcome Measure Help needed turning from your back to your side while in a flat bed without using bedrails?: None Help needed moving from lying on your back to sitting on the side of a flat bed without using bedrails?: None Help needed moving to and from a bed to a chair (including a wheelchair)?: A Little Help needed standing up from a chair using your arms (e.g., wheelchair or bedside chair)?: A Little Help needed to walk in hospital room?: A Lot Help needed climbing 3-5 steps with a railing? : A Lot 6 Click Score: 18    End of Session   Activity Tolerance: Patient limited by pain Patient left: in bed;with call bell/phone within reach Nurse Communication: Mobility status PT Visit Diagnosis: Difficulty in walking, not elsewhere classified (R26.2);Pain Pain - Right/Left: Left Pain - part of body: Leg    Time: 4696-2952 PT Time Calculation (min) (ACUTE ONLY): 15 min   Charges:   PT Evaluation $PT Eval Low Complexity: 1 Low  Christina Mack, PT DPT Acute Rehabilitation Services Office [phone]   Leonie Man 07/23/2022, 1:49 PM

## 2022-07-23 NOTE — Progress Notes (Signed)
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 Marisue Humble. Progression to ESRD especially in the context of possible early uremic symptoms. Renal txp u/s without acute findings -Discussed in detail next steps: dialysis. Appreciate IR for [facility] placement tomorrow (provided cultures remain neg). Plan to start HD tomorrow [slow start protocol]. Patient agrees. Discussed permanent access however patient refuses to get this done for now. Explained risks of catheter only and how we do not prefer that to be access of choice in the long term. She 'wants to take it one thing at a time'. Will continue conversations but will hold off on VVS consult until patient agrees -c/w home immunosuppression -Avoid nephrotoxic medications including NSAIDs and iodinated intravenous contrast exposure unless the latter is absolutely indicated.  Preferred narcotic agents for pain control are hydromorphone, fentanyl, and methadone. Morphine should not be used. Avoid Baclofen and avoid oral sodium phosphate and magnesium citrate based laxatives / bowel preps. Continue strict Input and Output monitoring. Will monitor the patient closely with you and intervene or adjust therapy as indicated by changes in clinical status/labs    Immunosuppression Management: -home IS: Tacrolimus  BID, prednisone 5 mg daily. Target FK goal 5-7 High Risk Medical Decision Making For Drug Therapy Requiring Intensive Monitoring For Toxicity I/S levels: tacrolimus trough to be drawn 4/21 Will continue intensive monitoring for drug levels and toxicity via regularly scheduled laboratory assays given the narrow therapeutic index of the immunosuppressive drug therapy listed above   Hypocalcemia -replete prn, will attempt to manage with HD: 3cal bath   Hypomag -replete prn  HypoK -replete PRN, will attempt to manage with HD: 4k bath    Metabolic acidosis -lactate WNL. Bicarb now detectable -c/w nahco3 gtt 75cc/hr until HD start   SIRS -agree with broad spec abx--per primary. 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 Cultures pending. MRI pending   HTN -resume home anti-HTNs. UF as tolerated   Anemia of CKD, thrombocytopenia -transfuse prn for Hgb <7.  Last dose of Retacrit 20,000 units was on 4/18, not due for ESA yet.  Would avoid IV Fe in the context of her needing empiric antibiotics  Subjective:   No acute events, no new complaints.   Objective:   BP (!) 153/90 (BP Location: Left Arm)   Pulse 89   Temp 98.3 F (36.8 C)   Resp 19   Ht  (1.651 m)   Wt 58.7 kg   SpO2 98%   BMI 21.55 kg/m   Intake/Output Summary (Last 24 hours) at 07/23/2022 1142 Last data filed at 07/23/2022 0800 Gross per 24 hour  Intake 1336.66 ml  Output --  Net 1336.66 ml   Weight change: -1.134 kg  Physical Exam: Gen: NAD CVS: RRR Resp: CTA b/l Abd: soft, NT(especially over txp sites), ND Ext: trace edema with warmth and erythema Neuro: awake, alert Dialysis access: none  Imaging: US Renal Transplant w/Doppler  Result Date: 07/22/2022 CLINICAL DATA:  Renal transplant, rejection EXAM: ULTRASOUND OF RENAL TRANSPLANT WITH RENAL DOPPLER ULTRASOUND TECHNIQUE: Ultrasound examination of the renal transplant was performed with gray-scale, color and duplex doppler evaluation. COMPARISON:  None Available. FINDINGS: Transplant kidney location: Right lower quadrant Transplant Kidney: Renal measurements: 12.0 x 4.8 x 5.0 cm = volume: . Renal cortical thickness is preserved, however, cortical echogenicity is diffusely, mildly increased. No intrarenal mass or hydronephrosis. No  peri-transplant fluid collection seen. Color flow in the main renal artery:  Present Color flow in the main renal vein:  Present Duplex Doppler Evaluation: Main Renal Artery Velocity: 168 cm/sec Main  Renal Artery Resistive Index: 0.8 Venous waveform in main renal vein:  Present Intrarenal resistive index in upper pole:  0.64 (normal 0.6-0.8; equivocal 0.8-0.9; abnormal >= 0.9) Intrarenal resistive index in lower pole: 0.69 (normal 0.6-0.8; equivocal 0.8-0.9; abnormal >= 0.9) Bladder: Normal for degree of bladder distention. Other findings:  None. IMPRESSION: 1. Mildly increased cortical echogenicity of the right lower quadrant transplant kidney, a nonspecific finding. This can be seen in the setting of medical renal disease. 2. Resolution of previously noted hydronephrosis. Electronically Signed   By: Helyn Numbers M.D.   On: 07/22/2022 00:15   DG Chest Port 1 View  Result Date: 07/21/2022 CLINICAL DATA:  Questionable sepsis - evaluate for abnormality EXAM: PORTABLE CHEST 1 VIEW COMPARISON:  03/19/2022 FINDINGS: Heart and mediastinal contours are within normal limits. No focal opacities or effusions. No acute bony abnormality. IMPRESSION: No active disease. Electronically Signed   By: Charlett Nose M.D.   On: 07/21/2022 22:29    Labs: BMET Recent Labs  Lab 07/20/22 0933 07/21/22 2129 07/22/22 0239 07/23/22 0202 07/23/22 1028  NA 136 135 130* 132*  --   K 3.2* 3.6 2.9* 2.6* 2.7*  CL 110 108 104 102  --   CO2 8* 8* <7* 8*  --   GLUCOSE 136* 96 139* 126*  --   BUN 138* 143* 140* 140*  --   CREATININE 11.74* 11.56* 11.74* 11.74*  --   CALCIUM 6.1* 6.0* 5.7* 6.4*  --   PHOS 11.5*  --  9.7* 9.4*  --    CBC Recent Labs  Lab 07/21/22 2129 07/22/22 0239 07/23/22 0202 07/23/22 1028  WBC 19.9* 20.0* 11.9*  --   NEUTROABS 16.4* 16.2*  --   --   HGB 8.1* 7.0* 6.5* 7.5*  HCT 25.3* 21.7* 19.6* 21.6*  MCV 96.2 95.2 92.9  --   PLT 142* 122* 124*  --     Medications:     sodium chloride   Intravenous Once   carvedilol  25 mg Oral BID WC   [START ON 07/24/2022] Chlorhexidine Gluconate Cloth  6 each Topical Q0600   pantoprazole  40 mg Oral BID AC   predniSONE  5 mg Oral Q breakfast    sodium bicarbonate  1,300 mg Oral TID   tacrolimus  1 mg Oral BID      Anthony Sar, MD [facility] 07/23/2022, 11:42 AM

## 2022-07-23 NOTE — Progress Notes (Signed)
  Progress Note   Patient: Christina Mack ZOX:[id number] DOB: [birth year] DOA: 07/21/2022     1 DOS: the patient was seen and examined on 07/23/2022        Brief hospital course: Mrs. Huron is a 57 y.o. 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     Assessment and Plan: * Acute renal failure superimposed on stage 4 chronic kidney disease - Consult Nephrology, appreciate expertise - Bicarb, fluids per Nephrology - Anticipate [facility] and HD tomorrow if blood cultures remain negative    Sepsis without end organ damage Presented with tachypnea, fever, leukocytosis. Does have renal failure, Cr >11.  Had a recent left thigh cellulitis, treated by her transplant team with doxycycline.  MRI today shows source is likely that same left leg cellulitis, emanating from the chronic osteomyelitis.  Has prior history PsA.  Is immunesuppressed. - Continue vancomycin and cefepime - Follow cultures data    Chronic osteomyelitis Developed after radiation for vulvar Ca in 2021.  Appears to be associated with some degree of chronic left lymphedema and cellulitis.  Has been admitted for sepsis from cellulitis twice, most recently in Dec 2023 (at that time had PsA bacteremia).    Evidently, planning for osteomyelitis resection is underway in order to enable a new kidney transplant.     Hypomagnesemia - Supplement Mag  Hypokalemia - Supplement K  History of anemia due to chronic kidney disease Hgb down to 6.5 today, transfused 1 unit - Hgb threshold 7 - Aranesp per nephrology  Hypocalcemia - Supplement Ca  Essential hypertension BP elevated - Continue Coreg  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VIN dx'd 2010.  In 2021, developed a large vulvar mass, treated with resection,  radiation.          Subjective: Has pain in the whole left leg. Aching, sore, worse with standing.  No swelling but a little red.  No fever, no confusion, no respiratory symptoms, no cough, no dyspnea, no urinary irritative symptoms.     Physical Exam: BP (!) 157/97   Pulse 79   Temp 97.7 F (36.5 C) (Oral)   Resp 19   Ht  (1.651 m)   Wt 58.7 kg   SpO2 99%   BMI 21.55 kg/m   Thin elderly female, no acute distress RRR, no murmurs, bilateral lower extremities are little bit edematous Respiratory rate normal, lungs clear without rales or wheezes Abdomen soft no tenderness palpation or guarding, no ascites or distention Attention normal, affect appropriate, judgment insight appear normal   Data Reviewed: Basic metabolic panel shows hyponatremia, hypokalemia, uremia, elevated creatinine, hypocalcemia, hypomagnesemia CBC shows anemia, mild thrombocytopenia MRI of the pelvis shows chronic osteomyelitis of the right inferior pubic ramus, either a fistula tract from the rectum, and diffuse cellulitis and mild fasciitis without necrosis or gas.  Family Communication: None present    Disposition: Status is: Inpatient         Author: Alberteen Sam, MD 07/23/2022 6:15 PM  For on call review [email or web address].

## 2022-07-23 NOTE — Assessment & Plan Note (Signed)
BP elevated - Continue Coreg 

## 2022-07-23 NOTE — Consult Note (Signed)
Chief Complaint: Progressive failure of transplanted kidney  Referring Provider(s): Fuller Song  Supervising Physician: Oley Balm  Patient Status: [facility] - In-pt  History of Present Illness: Christina Mack is a 57 y.o. 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  She was admitted on 07/21/2022 with acute on chronic renal failure.   She is on chronic antirejection therapy in the form of tacrolimus as well as daily prednisone.   She has a history of osteomyelitis of the pubis having completed a course of Augmentin and doxycycline in February and March.  In the ED she had a Tmax 100.5 and was tachy at 108.  CBC with WBC 19,900 with 83% neutrophils  Blood cultures x 2 were drawn. Prelim results as of today show no growth.   Dr. Thedore Mins was consulted and he feels she will need to start hemodialysis.  Today, her WBC is trending down =  11.9, down from 20 yesterday.   Temp at 0543 today was 99.3  Past Medical History:  Diagnosis Date   Complication of anesthesia    nausea and vomiting   DVT (deep venous thrombosis)    History of osteomyelitis    Kidney transplant failure    Kidney transplant recipient    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Past Surgical History:  Procedure Laterality Date   APPLICATION OF WOUND VAC Right 01/22/2022   Procedure: APPLICATION OF WOUND VAC;  Surgeon: Tarry Kos, MD;  Location: [facility] OR;  Service: Orthopedics;  Laterality: Right;   BIOPSY  04/24/2020   Procedure: BIOPSY;  Surgeon: Sherrilyn Rist, MD;  Location: [facility] ENDOSCOPY;  Service: Gastroenterology;;   BIOPSY  05/27/2022   Procedure: BIOPSY;  Surgeon: Tressia Danas, MD;  Location: [facility] ENDOSCOPY;  Service: Gastroenterology;;   COLONOSCOPY WITH PROPOFOL N/A 05/27/2022   Procedure: COLONOSCOPY WITH PROPOFOL;  Surgeon: Tressia Danas, MD;  Location: [facility] ENDOSCOPY;  Service: Gastroenterology;   Laterality: N/A;   ESOPHAGOGASTRODUODENOSCOPY (EGD) WITH PROPOFOL N/A 04/24/2020   Procedure: ESOPHAGOGASTRODUODENOSCOPY (EGD) WITH PROPOFOL;  Surgeon: Sherrilyn Rist, MD;  Location: [facility] ENDOSCOPY;  Service: Gastroenterology;  Laterality: N/A;   ESOPHAGOGASTRODUODENOSCOPY (EGD) WITH PROPOFOL N/A 05/27/2022   Procedure: ESOPHAGOGASTRODUODENOSCOPY (EGD) WITH PROPOFOL;  Surgeon: Tressia Danas, MD;  Location: [facility] ENDOSCOPY;  Service: Gastroenterology;  Laterality: N/A;   GIVENS CAPSULE STUDY N/A 05/28/2022   Procedure: GIVENS CAPSULE STUDY;  Surgeon: Tressia Danas, MD;  Location: [facility] ENDOSCOPY;  Service: Gastroenterology;  Laterality: N/A;   IR FLUORO GUIDE CV LINE RIGHT  03/24/2022   IR PERC TUN PERIT CATH WO PORT S&I /IMAG  03/24/2022   IR REMOVAL TUN CV CATH W/O FL  05/08/2022   IR US GUIDE BX ASP/DRAIN  04/30/2020   IR US GUIDE VASC ACCESS RIGHT  03/24/2022   KIDNEY TRANSPLANT  2001   KIDNEY TRANSPLANT  2014   LEEP     ORIF TIBIA PLATEAU Right 01/22/2022   Procedure: OPEN REDUCTION INTERNAL FIXATION (ORIF) TIBIAL PLATEAU;  Surgeon: Cammy Copa, MD;  Location: [facility] OR;  Service: Orthopedics;  Laterality: Right;   TOTAL ABDOMINAL HYSTERECTOMY     ovaries left in situ   TOTAL HIP ARTHROPLASTY Right 01/22/2022   Procedure: TOTAL HIP ARTHROPLASTY ANTERIOR APPROACH;  Surgeon: Tarry Kos, MD;  Location: [facility] OR;  Service: Orthopedics;  Laterality: Right;    Allergies: Oxycodone-acetaminophen, Piperacillin-tazobactam in dex, Zosyn [piperacillin sod-tazobactam so], Hydromorphone,  Amlodipine, Codeine, Hydralazine, and Zyvox [linezolid]  Medications: Prior to Admission medications   Medication Sig Start Date End Date Taking? Authorizing Provider  acetaminophen (TYLENOL) 500 MG tablet Take 2 tablets (1,000 mg total) by mouth 3 (three) times daily. Patient taking differently: Take 1,000 mg by mouth every 6 (six) hours as needed for mild pain. 05/05/20  Yes Doran Stabler, DO  carvedilol  (COREG) 12.5 MG tablet Take 2 tablets (25 mg total) by mouth 2 (two) times daily with a meal. 05/29/22  Yes Vann, Jessica U, DO  pantoprazole (PROTONIX) 40 MG tablet TAKE 1 TABLET (40 MG TOTAL) BY MOUTH 2 (TWO) TIMES DAILY BEFORE A MEAL. Patient taking differently: Take 40 mg by mouth 2 (two) times daily before a meal. 05/17/20 07/21/22 Yes Love, Evlyn Kanner, PA-C  predniSONE (DELTASONE) 5 MG tablet Take 5 mg by mouth daily with breakfast.   Yes [provider]  sodium bicarbonate 650 MG tablet Take 1 tablet (650 mg total) by mouth 3 (three) times daily. 03/25/22  Yes Noralee Stain, DO  tacrolimus (PROGRAF) 1 MG capsule Take 1 mg by mouth 2 (two) times daily. 04/12/22  Yes [provider]  VITAMIN D PO Take 1 tablet by mouth daily.   Yes [provider]  amoxicillin-clavulanate (AUGMENTIN) 500-125 MG tablet Take 1 tablet by mouth daily. Patient not taking: Reported on 07/21/2022 05/29/22   Joseph Art, DO  doxycycline (VIBRA-TABS) 100 MG tablet Take 1 tablet (100 mg total) by mouth every 12 (twelve) hours. Patient not taking: Reported on 07/21/2022 05/29/22   Joseph Art, DO     Family History  Problem Relation Age of Onset   Cerebral aneurysm Mother    AAA (abdominal aortic aneurysm) Brother    Breast cancer Paternal Aunt    Heart attack Maternal Grandmother    Brain cancer Maternal Grandfather    Colon cancer Neg Hx    Ovarian cancer Neg Hx    Endometrial cancer Neg Hx    Pancreatic cancer Neg Hx    Prostate cancer Neg Hx     Social History   Socioeconomic History   Marital status: Married    Spouse name: Not on file   Number of children: Not on file   Years of education: Not on file   Highest education level: Not on file  Occupational History   Not on file  Tobacco Use   Smoking status: Never   Smokeless tobacco: Never  Vaping Use   Vaping Use: Never used  Substance and Sexual Activity   Alcohol use: Not Currently   Drug use: Never   Sexual  activity: Not Currently  Other Topics Concern   Not on file  Social History Narrative   Not on file   Social Determinants of Health   Financial Resource Strain: Not on file  Food Insecurity: No Food Insecurity (07/22/2022)   Hunger Vital Sign    Worried About Running Out of Food in the Last Year: Never true    Ran Out of Food in the Last Year: Never true  Transportation Needs: No Transportation Needs (07/22/2022)   PRAPARE - Administrator, Civil Service (Medical): No    Lack of Transportation (Non-Medical): No  Physical Activity: Not on file  Stress: Not on file  Social Connections: Not on file     Review of Systems: A 12 point ROS discussed and pertinent positives are indicated in the HPI above.  All other systems are negative.  Review  of Systems  Vital Signs: BP (!) 153/90 (BP Location: Left Arm)   Pulse 89   Temp 98.3 F (36.8 C)   Resp 19   Ht  (1.651 m)   Wt 129 lb 8 oz (58.7 kg)   SpO2 98%   BMI 21.55 kg/m   Physical Exam Vitals reviewed.  Constitutional:      Appearance: Normal appearance.  HENT:     Head: Normocephalic and atraumatic.  Eyes:     Extraocular Movements: Extraocular movements intact.  Cardiovascular:     Rate and Rhythm: Normal rate and regular rhythm.  Pulmonary:     Effort: Pulmonary effort is normal. No respiratory distress.     Breath sounds: Normal breath sounds.  Abdominal:     Palpations: Abdomen is soft.  Musculoskeletal:        General: Normal range of motion.     Cervical back: Normal range of motion.  Skin:    General: Skin is warm and dry.  Neurological:     General: No focal deficit present.     Mental Status: She is alert and oriented to person, place, and time.  Psychiatric:        Mood and Affect: Mood normal.        Behavior: Behavior normal.        Thought Content: Thought content normal.        Judgment: Judgment normal.     Imaging: US Renal Transplant w/Doppler  Result Date:  07/22/2022 CLINICAL DATA:  Renal transplant, rejection EXAM: ULTRASOUND OF RENAL TRANSPLANT WITH RENAL DOPPLER ULTRASOUND TECHNIQUE: Ultrasound examination of the renal transplant was performed with gray-scale, color and duplex doppler evaluation. COMPARISON:  None Available. FINDINGS: Transplant kidney location: Right lower quadrant Transplant Kidney: Renal measurements: 12.0 x 4.8 x 5.0 cm = volume: . Renal cortical thickness is preserved, however, cortical echogenicity is diffusely, mildly increased. No intrarenal mass or hydronephrosis. No peri-transplant fluid collection seen. Color flow in the main renal artery:  Present Color flow in the main renal vein:  Present Duplex Doppler Evaluation: Main Renal Artery Velocity: 168 cm/sec Main Renal Artery Resistive Index: 0.8 Venous waveform in main renal vein:  Present Intrarenal resistive index in upper pole:  0.64 (normal 0.6-0.8; equivocal 0.8-0.9; abnormal >= 0.9) Intrarenal resistive index in lower pole: 0.69 (normal 0.6-0.8; equivocal 0.8-0.9; abnormal >= 0.9) Bladder: Normal for degree of bladder distention. Other findings:  None. IMPRESSION: 1. Mildly increased cortical echogenicity of the right lower quadrant transplant kidney, a nonspecific finding. This can be seen in the setting of medical renal disease. 2. Resolution of previously noted hydronephrosis. Electronically Signed   By: Helyn Numbers M.D.   On: 07/22/2022 00:15   DG Chest Port 1 View  Result Date: 07/21/2022 CLINICAL DATA:  Questionable sepsis - evaluate for abnormality EXAM: PORTABLE CHEST 1 VIEW COMPARISON:  03/19/2022 FINDINGS: Heart and mediastinal contours are within normal limits. No focal opacities or effusions. No acute bony abnormality. IMPRESSION: No active disease. Electronically Signed   By: Charlett Nose M.D.   On: 07/21/2022 22:29    Labs:  CBC: Recent Labs    05/28/22 0410 06/09/22 0981 07/20/22 0938 07/21/22 2129 07/22/22 0239 07/23/22 0202  WBC 8.2  --   --   19.9* 20.0* 11.9*  HGB 7.4*   < > 7.2* 8.1* 7.0* 6.5*  HCT 22.3*  --   --  25.3* 21.7* 19.6*  PLT 142*  --   --  142* 122* 124*   < > =  values in this interval not displayed.    COAGS: Recent Labs    03/19/22 1400 03/22/22 1634 04/18/22 1209 07/21/22 2157  INR 1.5* 1.2 1.1 1.3*  APTT  --  35  --  40*    BMP: Recent Labs    07/20/22 0933 07/21/22 2129 07/22/22 0239 07/23/22 0202  NA 136 135 130* 132*  K 3.2* 3.6 2.9* 2.6*  CL 110 108 104 102  CO2 8* 8* <7* 8*  GLUCOSE 136* 96 139* 126*  BUN 138* 143* 140* 140*  CALCIUM 6.1* 6.0* 5.7* 6.4*  CREATININE 11.74* 11.56* 11.74* 11.74*  GFRNONAA 3* 4* 3* 3*    LIVER FUNCTION TESTS: Recent Labs    05/26/22 1128 05/27/22 0109 06/23/22 0926 07/20/22 0933 07/21/22 2129 07/22/22 0239 07/23/22 0202  BILITOT 0.6 0.7  --   --  0.5 0.8  --   AST 10* 9*  --   --  8* 6*  --   ALT 9 9  --   --  6 8  --   ALKPHOS 88 70  --   --  71 56  --   PROT 7.7 7.1  --   --  7.7 6.5  --   ALBUMIN 2.6* 2.4*   < > 2.6* 2.7* 2.3* 2.1*   < > = values in this interval not displayed.    TUMOR MARKERS: No results for input(s): "AFPTM", "CEA", "CA199", "CHROMGRNA" in the last 8760 hours.  Assessment and Plan:  Acute on chronic failure of transplanted kidney.  Will plan for image guided placement of a tunneled hemodialysis catheter tomorrow as long as she remains afebrile.  Discussed with Dr. Maryfrances Bunnell and as long as blood cultures remain negative tomorrow, ok to proceed.  Risks and benefits discussed with the patient including, but not limited to bleeding, infection, vascular injury, pneumothorax which may require chest tube placement, air embolism or even death  All of the patient's questions were answered, patient is agreeable to proceed. Consent signed and in IR.   Thank you for allowing our service to participate in Christina Mack 's care.  Electronically Signed: Gwynneth Macleod, PA-C   07/23/2022, 9:53 AM      I spent a total  of 20 Minutes    in face to face in clinical consultation, greater than 50% of which was counseling/coordinating care for HD cath placement.

## 2022-07-23 NOTE — Progress Notes (Signed)
Pharmacy Antibiotic Note  Christina Mack is a 57 y.o. female admitted on 07/21/2022 with weakness, now w/ concern for sepsis.  Pharmacy has been consulted for vancomycin and cefepime dosing.  Plan to start HD tomorrow.  Plan: Pt had vancomycin  dose 4/20 0135; will redose depending on HD tolerance. Resume cefepime 1g IV Q24H.  Height:  (165.1 cm) Weight: 58.7 kg (129 lb 8 oz) IBW/kg (Calculated) : 57  Temp (24hrs), Avg:98.4 F (36.9 C), Min:97.7 F (36.5 C), Max:99.3 F (37.4 C)  Recent Labs  Lab 07/20/22 0933 07/21/22 2129 07/21/22 2157 07/21/22 2250 07/22/22 0239 07/23/22 0202  WBC  --  19.9*  --   --  20.0* 11.9*  CREATININE 11.74* 11.56*  --   --  11.74* 11.74*  LATICACIDVEN  --   --  0.7 0.9  --   --     Estimated Creatinine Clearance: 4.9 mL/min (A) (by C-G formula based on SCr of 11.74 mg/dL (H)).    Allergies  Allergen Reactions   Oxycodone-Acetaminophen Itching, Nausea And Vomiting, Nausea Only, Rash and Other (See Comments)    Headaches, also    Piperacillin-Tazobactam In Dex     Other Reaction(s): Other (See Comments)  Thrombocytopenia  Thrombocytopenia   Zosyn [Piperacillin Sod-Tazobactam So] Other (See Comments)    Thrombocytopenia   Hydromorphone Nausea And Vomiting, Rash and Other (See Comments)   Amlodipine Other (See Comments) and Swelling    Ankle swelling   Codeine     vomiting   Hydralazine     Headache   Zyvox [Linezolid] Other (See Comments)    Decreased platelet counts    Thank you for allowing pharmacy to be a part of this patient's care.  Vernard Gambles, PharmD, BCPS  07/23/2022 6:11 PM

## 2022-07-23 NOTE — Assessment & Plan Note (Signed)
-   Consult Nephrology, appreciate expertise - HD per Nephrology  - Continue home tacrolimus and prednisone

## 2022-07-23 NOTE — Assessment & Plan Note (Signed)
Presented with tachypnea, fever, leukocytosis. Does have renal failure, Cr >11.  Had a recent left thigh cellulitis, treated by her transplant team with doxycycline.  MRI today shows source is likely that same left leg cellulitis, emanating from the chronic osteomyelitis.  Has prior history PsA.  Is immunesuppressed. - Continue vancomycin and cefepime - Follow cultures data

## 2022-07-23 NOTE — Assessment & Plan Note (Signed)
Resolved

## 2022-07-23 NOTE — Assessment & Plan Note (Signed)
VIN dx'd 2010.  In 2021, developed a large vulvar mass, treated with resection, radiation.

## 2022-07-23 NOTE — Evaluation (Signed)
Occupational Therapy Evaluation Patient Details Name: Christina Mack MRN: [id number] DOB: [birth year] Today's Date: 07/23/2022   History of Present Illness Pt is a 57 y/o female presenting with progressive weakness in setting of acute renal failure superimposed on CKD stage V. Plan for placement of temporary catheter and initiation of HD on 4/22. PMH: renal transplant in 2014, CKD, HTN, anemia, osteomyelitis of the pubis   Clinical Impression   PTA, pt lives with spouse and typically Modified Independent with ADLs/mobility with intermittent cane use. Pt presents now with new LLE pain (sudden pain in dependent position that improves w/ activity) and deficits in endurance and strength. Overall, pt able to mobilize in room w/ IV pole for support and manage ADLs with no more than min guard. Will follow acutely for likely one more session to ensure continued progress once HD initiated. Anticipate no OT needs at DC.      Recommendations for follow up therapy are one component of a multi-disciplinary discharge planning process, led by the attending physician.  Recommendations may be updated based on patient status, additional functional criteria and insurance authorization.   Assistance Recommended at Discharge PRN  Patient can return home with the following Assistance with cooking/housework;Assist for transportation;Help with stairs or ramp for entrance    Functional Status Assessment  Patient has had a recent decline in their functional status and demonstrates the ability to make significant improvements in function in a reasonable and predictable amount of time.  Equipment Recommendations  None recommended by OT    Recommendations for Other Services       Precautions / Restrictions Precautions Precautions: Fall Precaution Comments: low fall risk Restrictions Weight Bearing Restrictions: No      Mobility Bed Mobility Overal bed mobility: Modified Independent                   Transfers Overall transfer level: Needs assistance Equipment used: None Transfers: Sit to/from Stand Sit to Stand: Min guard           General transfer comment: w/ IV pole for support      Balance Overall balance assessment: Needs assistance Sitting-balance support: Feet supported, No upper extremity supported Sitting balance-Leahy Scale: Good     Standing balance support: No upper extremity supported, During functional activity, Single extremity supported Standing balance-Leahy Scale: Fair Standing balance comment: fair+ IV pole for support/security d/t LLE pain                           ADL either performed or assessed with clinical judgement   ADL Overall ADL's : Needs assistance/impaired Eating/Feeding: Independent Eating/Feeding Details (indicate cue type and reason): though NPO at time of eval, anticipate no issues Grooming: Supervision/safety;Standing;Oral care Grooming Details (indicate cue type and reason): no LOB or safety concerns. SPV for safety due to reported LLE pain Upper Body Bathing: Modified independent;Sitting   Lower Body Bathing: Min guard;Sitting/lateral leans;Sit to/from stand   Upper Body Dressing : Modified independent;Sitting   Lower Body Dressing: Min guard;Sitting/lateral leans;Sit to/from stand   Toilet Transfer: Min guard;Ambulation   Toileting- Clothing Manipulation and Hygiene: Supervision/safety;Sitting/lateral lean;Sit to/from stand       Functional mobility during ADLs: Min guard General ADL Comments: limited by fatigue, reported SOB with increased activity (anticipates this will improve with HD), and sudden LLE pain in dependent position (lessens with activity but reportedly new for pt)     Vision Baseline Vision/History: 1 Wears glasses Ability to  See in Adequate Light: 0 Adequate Patient Visual Report: No change from baseline Vision Assessment?: No apparent visual deficits     Perception     Praxis       Pertinent Vitals/Pain Pain Assessment Pain Assessment: Faces Faces Pain Scale: Hurts even more Pain Location: L LE when initially in dependent position Pain Descriptors / Indicators: Squeezing, Throbbing Pain Intervention(s): Monitored during session, Limited activity within patient's tolerance     Hand Dominance Right   Extremity/Trunk Assessment Upper Extremity Assessment Upper Extremity Assessment: Overall WFL for tasks assessed   Lower Extremity Assessment Lower Extremity Assessment: Defer to PT evaluation   Cervical / Trunk Assessment Cervical / Trunk Assessment: Normal   Communication Communication Communication: No difficulties   Cognition Arousal/Alertness: Awake/alert Behavior During Therapy: WFL for tasks assessed/performed Overall Cognitive Status: Within Functional Limits for tasks assessed                                       General Comments       Exercises     Shoulder Instructions      Home Living Family/patient expects to be discharged to:: Private residence Living Arrangements: Spouse/significant other Available Help at Discharge: Family;Available 24 hours/day Type of Home: House Home Access: Stairs to enter [employer] of Steps: 3-4 at front vs back Entrance Stairs-Rails: Right;Left Home Layout: Two level;Able to live on main level with bedroom/bathroom Alternate Level Stairs-Number of Steps: flight Alternate Level Stairs-Rails: Right Bathroom Shower/Tub: Tub/shower unit;Walk-in shower   Bathroom Toilet: Standard Bathroom Accessibility: Yes   Home Equipment: Agricultural consultant (2 wheels);Tub bench;Cane - single point   Additional Comments: can stay on first floor, tub/shower w/ bench on first floor and walk in shower on second floor      Prior Functioning/Environment Prior Level of Function : Independent/Modified Independent;Working/employed;Driving             Mobility Comments: cane vs no AD for  mobility ADLs Comments: 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 MOD I for ADLs        OT Problem List: Decreased activity tolerance;Pain      OT Treatment/Interventions: Self-care/ADL training;Therapeutic exercise;DME and/or AE instruction;Energy conservation;Therapeutic activities;Patient/family education    OT Goals(Current goals can be found in the care plan section) Acute Rehab OT Goals Patient Stated Goal: feel better, hoping HD is not permanent OT Goal Formulation: With patient Time For Goal Achievement: 08/06/22 Potential to Achieve Goals: Good  OT Frequency: Min 2X/week    Co-evaluation              AM-PAC OT "6 Clicks" Daily Activity     Outcome Measure Help from another person eating meals?: None Help from another person taking care of personal grooming?: A Little Help from another person toileting, which includes using toliet, bedpan, or urinal?: A Little Help from another person bathing (including washing, rinsing, drying)?: A Little Help from another person to put on and taking off regular upper body clothing?: None Help from another person to put on and taking off regular lower body clothing?: A Little 6 Click Score: 20   End of Session Nurse Communication: Mobility status;Other (comment) (LLE pain)  Activity Tolerance: Patient tolerated treatment well;Patient limited by pain Patient left: in bed;with call bell/phone within reach  OT Visit Diagnosis: Other abnormalities of gait and mobility (R26.89)  Time: 4098-1191 OT Time Calculation (min): 17 min Charges:  OT General Charges $OT Visit: 1 Visit OT Evaluation $OT Eval Low Complexity: 1 Low  Bradd Canary, OTR/L Acute Rehab Services Office: [phone]   Lorre Munroe 07/23/2022, 11:59 AM

## 2022-07-23 NOTE — Assessment & Plan Note (Signed)
Improved after HD

## 2022-07-23 NOTE — Assessment & Plan Note (Addendum)
Developed after radiation for vulvar Ca in 2021.  Appears to be associated with some degree of chronic left lymphedema and cellulitis.  Has been admitted for sepsis from cellulitis twice, most recently in Dec 2023 (at that time had PsA bacteremia).    Evidently, planning for osteomyelitis resection is underway in order to enable a new kidney transplant.

## 2022-07-24 ENCOUNTER — Inpatient Hospital Stay (HOSPITAL_COMMUNITY): Payer: Medicare Other

## 2022-07-24 DIAGNOSIS — M8660 Other chronic osteomyelitis, unspecified site: Secondary | ICD-10-CM | POA: Diagnosis not present

## 2022-07-24 DIAGNOSIS — A419 Sepsis, unspecified organism: Secondary | ICD-10-CM | POA: Diagnosis not present

## 2022-07-24 DIAGNOSIS — N179 Acute kidney failure, unspecified: Secondary | ICD-10-CM | POA: Diagnosis not present

## 2022-07-24 DIAGNOSIS — I10 Essential (primary) hypertension: Secondary | ICD-10-CM | POA: Diagnosis not present

## 2022-07-24 HISTORY — PX: IR FLUORO GUIDE CV LINE RIGHT: IMG2283

## 2022-07-24 HISTORY — PX: IR US GUIDE VASC ACCESS RIGHT: IMG2390

## 2022-07-24 LAB — BASIC METABOLIC PANEL
Anion gap: 18 — ABNORMAL HIGH (ref 5–15)
BUN: 136 mg/dL — ABNORMAL HIGH (ref 6–20)
CO2: 13 mmol/L — ABNORMAL LOW (ref 22–32)
Calcium: 6 mg/dL — CL (ref 8.9–10.3)
Chloride: 103 mmol/L (ref 98–111)
Creatinine, Ser: 11.15 mg/dL — ABNORMAL HIGH (ref 0.44–1.00)
GFR, Estimated: 4 mL/min — ABNORMAL LOW (ref >60)
Glucose, Bld: 131 mg/dL — ABNORMAL HIGH (ref 70–99)
Potassium: 2.9 mmol/L — ABNORMAL LOW (ref 3.5–5.1)
Sodium: 134 mmol/L — ABNORMAL LOW (ref 135–145)

## 2022-07-24 LAB — CBC
HCT: 22.4 % — ABNORMAL LOW (ref 36.0–46.0)
Hemoglobin: 7.2 g/dL — ABNORMAL LOW (ref 12.0–15.0)
MCH: 30.1 pg (ref 26.0–34.0)
MCHC: 32.1 g/dL (ref 30.0–36.0)
MCV: 93.7 fL (ref 80.0–100.0)
Platelets: 115 10*3/uL — ABNORMAL LOW (ref 150–400)
RBC: 2.39 MIL/uL — ABNORMAL LOW (ref 3.87–5.11)
RDW: 16.4 % — ABNORMAL HIGH (ref 11.5–15.5)
WBC: 9.9 10*3/uL (ref 4.0–10.5)
nRBC: 0.2 % (ref 0.0–0.2)

## 2022-07-24 LAB — TYPE AND SCREEN: Antibody Screen: NEGATIVE

## 2022-07-24 LAB — TACROLIMUS LEVEL: Tacrolimus (FK506) - LabCorp: 4.2 ng/mL (ref 2.0–20.0)

## 2022-07-24 LAB — URINE CULTURE: Culture: 10000 — AB

## 2022-07-24 LAB — MAGNESIUM: Magnesium: 1.9 mg/dL (ref 1.7–2.4)

## 2022-07-24 LAB — CULTURE, BLOOD (ROUTINE X 2)

## 2022-07-24 LAB — BPAM RBC: ISSUE DATE / TIME: 202404210521

## 2022-07-24 LAB — GLUCOSE, CAPILLARY: Glucose-Capillary: 131 mg/dL — ABNORMAL HIGH (ref 70–99)

## 2022-07-24 MED ORDER — PENTAFLUOROPROP-TETRAFLUOROETH EX AERO: 1.0000 | INHALATION_SPRAY | CUTANEOUS | Status: DC | PRN

## 2022-07-24 MED ORDER — PROCHLORPERAZINE EDISYLATE 10 MG/2ML IJ SOLN: 10.0000 mg | Freq: Four times a day (QID) | INTRAMUSCULAR | Status: DC | PRN

## 2022-07-24 MED ORDER — HEPARIN SODIUM (PORCINE) 1000 UNIT/ML IJ SOLN
INTRAMUSCULAR | Status: AC
  Filled 2022-07-24: qty 10

## 2022-07-24 MED ORDER — VANCOMYCIN HCL IN DEXTROSE 1-5 GM/200ML-% IV SOLN
1000.0000 mg | Freq: Once | INTRAVENOUS | Status: AC
  Administered 2022-07-24: 1000 mg via INTRAVENOUS

## 2022-07-24 MED ORDER — ANTICOAGULANT SODIUM CITRATE 4% (200MG/5ML) IV SOLN
5.0000 mL | Status: DC | PRN
  Filled 2022-07-24: qty 5

## 2022-07-24 MED ORDER — VANCOMYCIN HCL IN DEXTROSE 1-5 GM/200ML-% IV SOLN
INTRAVENOUS | Status: AC
  Filled 2022-07-24: qty 200

## 2022-07-24 MED ORDER — CALCIUM GLUCONATE-NACL 2-0.675 GM/100ML-% IV SOLN
2.0000 g | Freq: Once | INTRAVENOUS | Status: AC
  Administered 2022-07-24: 2000 mg via INTRAVENOUS
  Filled 2022-07-24: qty 100

## 2022-07-24 MED ORDER — POTASSIUM CHLORIDE CRYS ER 20 MEQ PO TBCR
40.0000 meq | EXTENDED_RELEASE_TABLET | Freq: Once | ORAL | Status: AC
  Administered 2022-07-24: 40 meq via ORAL
  Filled 2022-07-24: qty 2

## 2022-07-24 MED ORDER — GELATIN ABSORBABLE 12-7 MM EX MISC
CUTANEOUS | Status: AC
  Filled 2022-07-24: qty 1

## 2022-07-24 MED ORDER — CHLORHEXIDINE GLUCONATE CLOTH 2 % EX PADS
6.0000 | MEDICATED_PAD | Freq: Every day | CUTANEOUS | Status: DC
  Administered 2022-07-25 – 2022-07-26 (×2): 6 via TOPICAL

## 2022-07-24 MED ORDER — HEPARIN SODIUM (PORCINE) 1000 UNIT/ML DIALYSIS: 1000.0000 [IU] | INTRAMUSCULAR | Status: DC | PRN

## 2022-07-24 MED ORDER — FENTANYL CITRATE (PF) 100 MCG/2ML IJ SOLN
INTRAMUSCULAR | Status: AC
  Filled 2022-07-24: qty 2

## 2022-07-24 MED ORDER — MIDAZOLAM HCL 2 MG/2ML IJ SOLN
INTRAMUSCULAR | Status: AC
  Filled 2022-07-24: qty 2

## 2022-07-24 MED ORDER — MIDAZOLAM HCL 2 MG/2ML IJ SOLN
INTRAMUSCULAR | Status: AC | PRN
  Administered 2022-07-24: 1 mg via INTRAVENOUS
  Administered 2022-07-24 (×2): .5 mg via INTRAVENOUS

## 2022-07-24 MED ORDER — ALTEPLASE 2 MG IJ SOLR: 2.0000 mg | Freq: Once | INTRAMUSCULAR | Status: DC | PRN

## 2022-07-24 MED ORDER — POTASSIUM CHLORIDE CRYS ER 20 MEQ PO TBCR: 40.0000 meq | EXTENDED_RELEASE_TABLET | Freq: Two times a day (BID) | ORAL | Status: DC

## 2022-07-24 MED ORDER — FENTANYL CITRATE (PF) 100 MCG/2ML IJ SOLN
INTRAMUSCULAR | Status: AC | PRN
  Administered 2022-07-24 (×2): 25 ug via INTRAVENOUS
  Administered 2022-07-24: 50 ug via INTRAVENOUS

## 2022-07-24 MED ORDER — LIDOCAINE-EPINEPHRINE 1 %-1:100000 IJ SOLN
INTRAMUSCULAR | Status: AC
  Filled 2022-07-24: qty 1

## 2022-07-24 MED ORDER — LIDOCAINE HCL (PF) 1 % IJ SOLN: 5.0000 mL | INTRAMUSCULAR | Status: DC | PRN

## 2022-07-24 MED ORDER — LIDOCAINE-PRILOCAINE 2.5-2.5 % EX CREA: 1.0000 | TOPICAL_CREAM | CUTANEOUS | Status: DC | PRN

## 2022-07-24 MED ORDER — LIDOCAINE HCL 1 % IJ SOLN
INTRAMUSCULAR | Status: AC
  Filled 2022-07-24: qty 20

## 2022-07-24 NOTE — Progress Notes (Signed)
  Progress Note   Patient: Christina Mack ZOX:[id number] DOB: 1965/07/14 DOA: 07/21/2022     2 DOS: the patient was seen and examined on 07/24/2022        Brief hospital course: Mrs. Aune is a 57 y.o. 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     Assessment and Plan: * Acute renal failure superimposed on stage 4 chronic kidney disease TDC today - HD per nephrology    Sepsis without end organ damage due to cellulitis and myofasciitis Chronic osteomyelitis Cultures NGTD  - Continue vanc and cefepime  - Check US DVT in left   Hypomagnesemia - SUpp mag  Hypokalemia - Supp K  History of anemia due to chronic kidney disease Hgb stable at 7.5  Hypocalcemia - Supp Ca  Essential hypertension BP elevated - HD - Coreg           Subjective: No acute change, she has nausea which is typical of recently, she still has pain in the left leg and swelling.     Physical Exam: BP (!) 163/102   Pulse 81   Temp 98 F (36.7 C)   Resp 18   Ht  (1.651 m)   Wt 61.5 kg   SpO2 100%   BMI 22.56 kg/m   Thin adult female, lying in bed, eating breakfast, no acute distress RRR, no murmurs, no DVT Respiratory rate normal, lungs clear without rales or wheezes Left leg still red and edematous   Data Reviewed: Hemoglobin stable above 7, platelets 115 Calcium 6, creatinine 11.12, potassium 2.9, magnesium 1.9 Discussed with nephrology  Family Communication: Daughter at the bedside    Disposition: Status is: Inpatient         Author: Alberteen Sam, MD 07/24/2022 6:21 PM  For on call review [email or web address].

## 2022-07-24 NOTE — Progress Notes (Signed)
Received patient in bed to unit.  Alert and oriented.  Informed consent signed and in chart.   TX duration: 2hrs  Patient tolerated well.  Alert, without acute distress.  Hand-off given to patient's nurse.   Access used: R IJ Access issues: None  Total UF removed: Medication(s) given: None  Post HD weight: 61.5kg   07/24/22 1545  Vitals  Temp 98.2 F (36.8 C)  Temp Source Oral  BP (!) 166/103  MAP (mmHg) 121  BP Location Left Arm  BP Method Automatic  Patient Position (if appropriate) Lying  Pulse Rate 88  Pulse Rate Source Monitor  ECG Heart Rate 88  Resp (!) 23  Oxygen Therapy  SpO2 99 %  O2 Device Room Air  During Treatment Monitoring  HD Safety Checks Performed Yes  Intra-Hemodialysis Comments Tx completed;Tolerated well  Dialysis Fluid Bolus Normal Saline  Bolus Amount (mL) 300 mL  Post Treatment  Dialyzer Clearance Heavily streaked  Duration of HD Treatment -hour(s) 2 hour(s)  Liters Processed 24  Fluid Removed (mL) 500 mL  Tolerated HD Treatment Yes     [facility]

## 2022-07-24 NOTE — Progress Notes (Addendum)
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 Marisue Humble. Progression to ESRD especially in the context of possible early uremic symptoms. Renal txp u/s without acute findings.  RIJ tunn catheter placed 4/22.  - HD today  - Next HD on 4/23, 2nd treatment   - Started CLIP process to locate outpatient HD unit  - ordered strict ins/outs - Nephrology has discussed permanent access however patient  refuses to get this done for now. Explained risks of catheter only and how we do not prefer that to be access of choice in the long term. She 'wants to take it one thing at a time'. Will continue conversations but will hold off on VVS consult until patient agrees - ordering vein mapping  -c/w home immunosuppression  Immunosuppression Management: -home IS: Tacrolimus 1mg  BID, prednisone 5 mg daily. Target FK goal 5-7 High Risk Medical Decision Making For Drug Therapy Requiring Intensive Monitoring For Toxicity I/S levels: tacrolimus trough to be drawn 4/21 however seen this was actually drawn at 2 am so doubt will be helpful   Hypocalcemia -replete prn, will attempt to manage with HD: 3cal bath if available - hopeful for improvement with HD, as well      Hypomag -replete prn  HypoK -replete PRN - had 4K bath today - transition to 3K    Metabolic acidosis -lactate WNL. Bicarb now detectable -stop supplemental bicarbonate - will be managed with HD    SIRS - per primary team  - abx per primary team.  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 Cultures pending.    HTN - optimize volume with HD; on coreg    Anemia of CKD, thrombocytopenia -transfuse prn for Hgb <7.  Last dose of Retacrit 20,000 units was on 4/18, not due for ESA yet.  Would avoid IV Fe in the context of her needing empiric antibiotics.  She confirms  history of prior vulvar cancer - not current diagnosis    Disposition - continue inpatient monitoring      Subjective:   Seen and examined on dialysis.  RIJ tunn catheter in use.  Blood pressure 154/106 and HR 90.  Almost done with HD.  She states that she needs to urinate.  She and I discussed getting vein mapping - she is ok with getting the vein mapping done.  Previously she did HD and had a catheter which she states was due to "personal preference".  We discussed risks.     Objective:   BP (!) 160/105 (BP Location: Left Arm)   Pulse 88   Temp 97.6 F (36.4 C) (Oral)   Resp 20   Ht 5\' 5"  (1.651 m)   Wt 62 kg   SpO2 99%   BMI 22.75 kg/m   Intake/Output Summary (Last 24 hours) at 07/24/2022 1546 Last data filed at 07/24/2022 0630 Gross per 24 hour  Intake 1550.76 ml  Output --  Net 1550.76 ml   Weight change:   Physical Exam: General adult female in bed in no acute distress HEENT normocephalic atraumatic extraocular movements intact sclera anicteric Neck supple trachea midline Lungs clear to auscultation bilaterally normal work of breathing at rest  Heart regular rate and rhythm no rubs or gallops appreciated Abdomen soft nontender nondistended Extremities 1-2+ edema  Psych normal mood and affect Neuro - alert and oriented x 3;  conversant; follows commands and provides hx  GU - no foley  Access RIJ tunn catheter  Imaging: IR Fluoro Guide CV Line Right  Result Date: 07/24/2022 INDICATION: 57 year old female with a history of renal failure referred for tunneled hemodialysis catheter EXAM: TUNNELED CENTRAL VENOUS HEMODIALYSIS CATHETER PLACEMENT WITH ULTRASOUND AND FLUOROSCOPIC GUIDANCE MEDICATIONS: Vancomycin 1 gm IV . The antibiotic was given in an appropriate time interval prior to skin puncture. ANESTHESIA/SEDATION: Moderate (conscious) sedation was employed during this procedure. A total of Versed 2.0 mg and Fentanyl 100 mcg was administered intravenously by the  radiology nurse. Total intra-service moderate Sedation Time: 16 minutes. The patient's level of consciousness and vital signs were monitored continuously by radiology nursing throughout the procedure under my direct supervision. FLUOROSCOPY: Radiation Exposure Index (as provided by the fluoroscopic device): 1 mGy Kerma COMPLICATIONS: None PROCEDURE: Informed written consent was obtained from the patient after a discussion of the risks, benefits, and alternatives to treatment. Questions regarding the procedure were encouraged and answered. The right neck and chest were prepped with chlorhexidine in a sterile fashion, and a sterile drape was applied covering the operative field. Maximum barrier sterile technique with sterile gowns and gloves were used for the procedure. A timeout was performed prior to the initiation of the procedure. Ultrasound survey was performed. The right internal jugular vein was confirmed to be patent, with images stored and sent to PACS. Micropuncture kit was utilized to access the right internal jugular vein under direct, real-time ultrasound guidance after the overlying soft tissues were anesthetized with 1% lidocaine with epinephrine. Stab incision was made with 11 blade scalpel. Microwire was passed centrally. The microwire was then marked to measure appropriate internal catheter length. External tunneled length was estimated. A total tip to cuff length of 19 cm was selected. 035 guidewire was advanced to the level of the IVC. Skin and subcutaneous tissues of chest wall below the clavicle were generously infiltrated with 1% lidocaine for local anesthesia. A small stab incision was made with 11 blade scalpel. The selected hemodialysis catheter was tunneled in a retrograde fashion from the anterior chest wall to the venotomy incision. Serial dilation was performed and then a peel-away sheath was placed. The catheter was then placed through the peel-away sheath with tips ultimately positioned  within the superior aspect of the right atrium. Final catheter positioning was confirmed and documented with a spot radiographic image. The catheter aspirates and flushes normally. The catheter was flushed with appropriate volume heparin dwells. The catheter exit site was secured with a 0-Prolene retention suture. Gel-Foam slurry was infused into the soft tissue tract. Because of the uremia and some mild oozing at the exit site on the chest wall additional suture was placed with 0 Prolene. The venotomy incision was closed Derma bond and sterile dressing. Dressings were applied at the chest wall. Patient tolerated the procedure well and remained hemodynamically stable throughout. No complications were encountered and no significant blood loss encountered. IMPRESSION: Status post image guided right IJ tunneled hemodialysis catheter. Signed, Yvone Neu. Miachel Roux, RPVI [facility] Electronically Signed   By: Gilmer Mor D.O.   On: 07/24/2022 10:19   IR US Guide Vasc Access Right  Result Date: 07/24/2022 INDICATION: 57 year old female with a history of renal failure referred for tunneled hemodialysis catheter EXAM: TUNNELED CENTRAL VENOUS HEMODIALYSIS CATHETER PLACEMENT WITH ULTRASOUND AND FLUOROSCOPIC GUIDANCE MEDICATIONS: Vancomycin 1 gm IV . The antibiotic was given in an appropriate time interval prior to skin puncture.  ANESTHESIA/SEDATION: Moderate (conscious) sedation was employed during this procedure. A total of Versed 2.0 mg and Fentanyl 100 mcg was administered intravenously by the radiology nurse. Total intra-service moderate Sedation Time: 16 minutes. The patient's level of consciousness and vital signs were monitored continuously by radiology nursing throughout the procedure under my direct supervision. FLUOROSCOPY: Radiation Exposure Index (as provided by the fluoroscopic device): 1 mGy Kerma COMPLICATIONS: None PROCEDURE: Informed  written consent was obtained from the patient after a discussion of the risks, benefits, and alternatives to treatment. Questions regarding the procedure were encouraged and answered. The right neck and chest were prepped with chlorhexidine in a sterile fashion, and a sterile drape was applied covering the operative field. Maximum barrier sterile technique with sterile gowns and gloves were used for the procedure. A timeout was performed prior to the initiation of the procedure. Ultrasound survey was performed. The right internal jugular vein was confirmed to be patent, with images stored and sent to PACS. Micropuncture kit was utilized to access the right internal jugular vein under direct, real-time ultrasound guidance after the overlying soft tissues were anesthetized with 1% lidocaine with epinephrine. Stab incision was made with 11 blade scalpel. Microwire was passed centrally. The microwire was then marked to measure appropriate internal catheter length. External tunneled length was estimated. A total tip to cuff length of 19 cm was selected. 035 guidewire was advanced to the level of the IVC. Skin and subcutaneous tissues of chest wall below the clavicle were generously infiltrated with 1% lidocaine for local anesthesia. A small stab incision was made with 11 blade scalpel. The selected hemodialysis catheter was tunneled in a retrograde fashion from the anterior chest wall to the venotomy incision. Serial dilation was performed and then a peel-away sheath was placed. The catheter was then placed through the peel-away sheath with tips ultimately positioned within the superior aspect of the right atrium. Final catheter positioning was confirmed and documented with a spot radiographic image. The catheter aspirates and flushes normally. The catheter was flushed with appropriate volume heparin dwells. The catheter exit site was secured with a 0-Prolene retention suture. Gel-Foam slurry was infused into the soft  tissue tract. Because of the uremia and some mild oozing at the exit site on the chest wall additional suture was placed with 0 Prolene. The venotomy incision was closed Derma bond and sterile dressing. Dressings were applied at the chest wall. Patient tolerated the procedure well and remained hemodynamically stable throughout. No complications were encountered and no significant blood loss encountered. IMPRESSION: Status post image guided right IJ tunneled hemodialysis catheter. Signed, Yvone Neu. Miachel Roux, RPVI [facility] Radiology Electronically Signed   By: Gilmer Mor D.O.   On: 07/24/2022 10:19   MR PELVIS WO CONTRAST  Result Date: 07/23/2022 CLINICAL DATA:  Sepsis.  Osteomyelitis. EXAM: MRI PELVIS WITHOUT CONTRAST TECHNIQUE: Multiplanar multisequence MR imaging of the pelvis was performed. No intravenous contrast was administered. COMPARISON:  CT scan 03/20/2022 FINDINGS: Urinary Tract: The bladder is unremarkable. No bladder mass or calculi. Bowel: The rectum, sigmoid colon and visualized pelvic bowel loops are unremarkable. Vascular/Lymphatic: The major vascular structures are unremarkable. No pelvic or inguinal adenopathy. Reproductive:  Surgically absent. 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 Musculoskeletal: Chronic osteomyelitis involving the right inferior pubic ramus which demonstrates destructive bony changes. 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 No discrete fluid collection/abscess is identified. There is a small amount of free pelvic fluid of uncertain significance. Right hip prosthesis no obvious  complicating features. Diffuse cellulitis and myofasciitis involving hip and pelvic musculature most notably the adductor muscles bilaterally. No definite findings for pyomyositis. IMPRESSION: 1. Chronic osteomyelitis involving the right inferior pubic ramus which demonstrates destructive bony changes. 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 2. Persistent air containing fistulous track possibly involving the anterior wall the rectum or anus and extending into the region of the inferior pubic ramus. No discrete fluid collection/abscess is identified. 3. Small amount of free pelvic fluid of uncertain significance. 4. Diffuse cellulitis and myofasciitis involving hip and pelvic musculature most notably the adductor muscles bilaterally. No definite findings for pyomyositis. Electronically Signed   By: Rudie Meyer M.D.   On: 07/23/2022 16:06    Labs: BMET Recent Labs  Lab 07/20/22 0933 07/21/22 2129 07/22/22 0239 07/23/22 0202 07/23/22 1028 07/24/22 0301  NA 136 135 130* 132*  --  134*  K 3.2* 3.6 2.9* 2.6* 2.7* 2.9*  CL 110 108 104 102  --  103  CO2 8* 8* <7* 8*  --  13*  GLUCOSE 136* 96 139* 126*  --  131*  BUN 138* 143* 140* 140*  --  136*  CREATININE 11.74* 11.56* 11.74* 11.74*  --  11.15*  CALCIUM 6.1* 6.0* 5.7* 6.4*  --  6.0*  PHOS 11.5*  --  9.7* 9.4*  --   --    CBC Recent Labs  Lab 07/21/22 2129 07/22/22 0239 07/23/22 0202 07/23/22 1028 07/24/22 0301  WBC 19.9* 20.0* 11.9*  --  9.9  NEUTROABS 16.4* 16.2*  --   --   --   HGB 8.1* 7.0* 6.5* 7.5* 7.2*  HCT 25.3* 21.7* 19.6* 21.6* 22.4*  MCV 96.2 95.2 92.9  --  93.7  PLT 142* 122* 124*  --  115*    Medications:     sodium chloride   Intravenous Once   carvedilol  25 mg Oral BID WC   Chlorhexidine Gluconate Cloth  6 each Topical Q0600   pantoprazole  40 mg Oral BID AC   predniSONE  5 mg Oral Q breakfast   sodium bicarbonate  1,300 mg Oral  TID   tacrolimus  1 mg Oral BID   vancomycin variable dose per unstable renal function (pharmacist dosing)   Does not apply See admin instructions    Estanislado Emms, MD 4:09 PM 07/24/2022

## 2022-07-24 NOTE — Procedures (Addendum)
Interventional Radiology Procedure Note  Procedure: Placement of a right IJ approach tunneled HD. 19cm tip to cuff.  Tip is positioned at the superior cavoatrial junction and catheter is ready for immediate use.   Complications: None Recommendations:  - OK to use - expect some possible venous ooze at the chest/neck sites while uremic. Might need prn dressing changes with soiled dressing.  A prolene stitch was placed at the chest exit site, which can be removed in 48 hrs - Do not submerge - Routine line care   Signed,  Yvone Neu. Loreta Ave, DO

## 2022-07-25 ENCOUNTER — Inpatient Hospital Stay (HOSPITAL_COMMUNITY): Payer: Medicare Other

## 2022-07-25 DIAGNOSIS — I10 Essential (primary) hypertension: Secondary | ICD-10-CM | POA: Diagnosis not present

## 2022-07-25 DIAGNOSIS — N179 Acute kidney failure, unspecified: Secondary | ICD-10-CM | POA: Diagnosis not present

## 2022-07-25 DIAGNOSIS — N186 End stage renal disease: Secondary | ICD-10-CM

## 2022-07-25 DIAGNOSIS — M79662 Pain in left lower leg: Secondary | ICD-10-CM

## 2022-07-25 DIAGNOSIS — A419 Sepsis, unspecified organism: Secondary | ICD-10-CM | POA: Diagnosis not present

## 2022-07-25 DIAGNOSIS — M8660 Other chronic osteomyelitis, unspecified site: Secondary | ICD-10-CM | POA: Diagnosis not present

## 2022-07-25 LAB — RENAL FUNCTION PANEL
Albumin: 2.1 g/dL — ABNORMAL LOW (ref 3.5–5.0)
Anion gap: 15 (ref 5–15)
BUN: 90 mg/dL — ABNORMAL HIGH (ref 6–20)
CO2: 20 mmol/L — ABNORMAL LOW (ref 22–32)
Calcium: 7 mg/dL — ABNORMAL LOW (ref 8.9–10.3)
Chloride: 99 mmol/L (ref 98–111)
Creatinine, Ser: 8.05 mg/dL — ABNORMAL HIGH (ref 0.44–1.00)
GFR, Estimated: 5 mL/min — ABNORMAL LOW (ref >60)
Glucose, Bld: 94 mg/dL (ref 70–99)
Phosphorus: 6.3 mg/dL — ABNORMAL HIGH (ref 2.5–4.6)
Potassium: 3.7 mmol/L (ref 3.5–5.1)
Sodium: 134 mmol/L — ABNORMAL LOW (ref 135–145)

## 2022-07-25 LAB — CBC
HCT: 22.6 % — ABNORMAL LOW (ref 36.0–46.0)
Hemoglobin: 7.5 g/dL — ABNORMAL LOW (ref 12.0–15.0)
MCH: 30.2 pg (ref 26.0–34.0)
MCHC: 33.2 g/dL (ref 30.0–36.0)
MCV: 91.1 fL (ref 80.0–100.0)
Platelets: 113 10*3/uL — ABNORMAL LOW (ref 150–400)
RBC: 2.48 MIL/uL — ABNORMAL LOW (ref 3.87–5.11)
RDW: 16.6 % — ABNORMAL HIGH (ref 11.5–15.5)
WBC: 8 10*3/uL (ref 4.0–10.5)
nRBC: 0 % (ref 0.0–0.2)

## 2022-07-25 LAB — TACROLIMUS LEVEL: Tacrolimus (FK506) - LabCorp: 6.6 ng/mL (ref 2.0–20.0)

## 2022-07-25 LAB — VANCOMYCIN, RANDOM: Vancomycin Rm: 30 ug/mL

## 2022-07-25 LAB — URINE CULTURE

## 2022-07-25 LAB — HEPATITIS B SURFACE ANTIBODY, QUANTITATIVE: Hep B S AB Quant (Post): <3.5 m[IU]/mL — ABNORMAL LOW (ref >9.9)

## 2022-07-25 MED ORDER — HYDROCODONE-ACETAMINOPHEN 5-325 MG PO TABS
1.0000 | ORAL_TABLET | Freq: Four times a day (QID) | ORAL | Status: DC | PRN
  Administered 2022-07-25: 1 via ORAL
  Filled 2022-07-25: qty 1

## 2022-07-25 MED ORDER — SALINE SPRAY 0.65 % NA SOLN
1.0000 | NASAL | Status: DC | PRN
  Filled 2022-07-25: qty 44

## 2022-07-25 MED ORDER — HEPARIN SODIUM (PORCINE) 1000 UNIT/ML IJ SOLN
INTRAMUSCULAR | Status: AC
  Administered 2022-07-25: 3200 [IU]
  Filled 2022-07-25: qty 1

## 2022-07-25 MED ORDER — SODIUM CHLORIDE 0.9 % IV SOLN
1.0000 g | Freq: Once | INTRAVENOUS | Status: AC
  Administered 2022-07-25: 1 g via INTRAVENOUS
  Filled 2022-07-25: qty 10

## 2022-07-25 NOTE — Progress Notes (Signed)
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 Marisue Humble. Progression to ESRD especially in the context of possible early uremic symptoms. Renal txp u/s without acute findings.  RIJ tunn catheter placed 4/22.  - HD today and then per TTS schedule for now  - Started CLIP process to locate outpatient HD unit  - ordered strict ins/outs - Nephrology has discussed permanent access however patient  refuses to get this done for now.  We have discussed risks of dialysis with a catheter only and she has stated that she "wants to take it one thing at a time". Will continue conversations but will hold off on VVS consult until patient agrees - ordered vein mapping - she is listed as "arrived" for this study but states she has not had this done yet  -c/w home immunosuppression  Immunosuppression Management: -home IS: Tacrolimus 1mg  BID, prednisone 5 mg daily. Target FK goal 5-7 High Risk Medical Decision Making For Drug Therapy Requiring Intensive Monitoring For Toxicity I/S levels: tacrolimus trough to be drawn 4/21 however see this was actually drawn at 2 am so doubt will be helpful   Hypocalcemia - replete prn, high calcium bath  - improving with HD    Hypomag -replete prn  HypoK - improving with HD and higher K bath  - using 3K bath with HD for now - follow trends.     Metabolic acidosis -lactate WNL. Bicarb now detectable -will be managed with HD    SIRS - per primary team  - abx per primary team.  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 Cultures pending with urine with low colony count pseudomonas aeruginosa. On cefepime.    HTN - optimize volume with HD; on coreg    Anemia of CKD, thrombocytopenia -transfuse prn for Hgb <7.  Last dose of Retacrit 20,000 units was on 4/18, not due for ESA yet.  Would  avoid IV Fe in the context of her needing antibiotics for now.  She confirms history of prior vulvar cancer - not current diagnosis    Disposition - continue inpatient monitoring.  She needs an outpatient HD unit.       Subjective:   Last HD on 4/23 with 1 kg UF.  She had 4 unmeasured urine voids over 4/22 and has had 200 mL UOP thus far on 4/23 charted.  She previously was on HD and had a catheter and states that this was due to "personal preference".  Vein mapping hasn't been completed yet.  Updated her husband at bedside.    Review of systems:  Denies shortness of breath or chest pain  Reports that she had nausea after HD but none now.   Feels ok She has been urinating in the bedside commode.     Objective:   BP (!) 166/105 (BP Location: Right Arm)   Pulse 81   Temp 98.3 F (36.8 C) (Oral)   Resp 13   Ht 5\' 5"  (1.651 m)   Wt 60.2 kg   SpO2 99%   BMI 22.09 kg/m   Intake/Output Summary (Last 24 hours) at 07/25/2022 1549 Last data filed at 07/25/2022 1509 Gross per 24 hour  Intake 760.66 ml  Output 1200 ml  Net -439.34 ml   Weight change:   Physical Exam:   General adult female in bed in no acute  distress HEENT normocephalic atraumatic extraocular movements intact sclera anicteric Neck supple trachea midline Lungs clear to auscultation bilaterally normal work of breathing at rest  Heart S1S2 no rub Abdomen soft nontender nondistended Extremities 1+ edema  Psych normal mood and affect Neuro - alert and oriented x 3; conversant; follows commands and provides hx  GU - no foley  Access RIJ tunn catheter  Imaging: IR Fluoro Guide CV Line Right  Result Date: 07/24/2022 INDICATION: 57 year old female with a history of renal failure referred for tunneled hemodialysis catheter EXAM: TUNNELED CENTRAL VENOUS HEMODIALYSIS CATHETER PLACEMENT WITH ULTRASOUND AND FLUOROSCOPIC GUIDANCE MEDICATIONS: Vancomycin 1 gm IV . The antibiotic was given in an appropriate time interval prior  to skin puncture. ANESTHESIA/SEDATION: Moderate (conscious) sedation was employed during this procedure. A total of Versed 2.0 mg and Fentanyl 100 mcg was administered intravenously by the radiology nurse. Total intra-service moderate Sedation Time: 16 minutes. The patient's level of consciousness and vital signs were monitored continuously by radiology nursing throughout the procedure under my direct supervision. FLUOROSCOPY: Radiation Exposure Index (as provided by the fluoroscopic device): 1 mGy Kerma COMPLICATIONS: None PROCEDURE: Informed written consent was obtained from the patient after a discussion of the risks, benefits, and alternatives to treatment. Questions regarding the procedure were encouraged and answered. The right neck and chest were prepped with chlorhexidine in a sterile fashion, and a sterile drape was applied covering the operative field. Maximum barrier sterile technique with sterile gowns and gloves were used for the procedure. A timeout was performed prior to the initiation of the procedure. Ultrasound survey was performed. The right internal jugular vein was confirmed to be patent, with images stored and sent to PACS. Micropuncture kit was utilized to access the right internal jugular vein under direct, real-time ultrasound guidance after the overlying soft tissues were anesthetized with 1% lidocaine with epinephrine. Stab incision was made with 11 blade scalpel. Microwire was passed centrally. The microwire was then marked to measure appropriate internal catheter length. External tunneled length was estimated. A total tip to cuff length of 19 cm was selected. 035 guidewire was advanced to the level of the IVC. Skin and subcutaneous tissues of chest wall below the clavicle were generously infiltrated with 1% lidocaine for local anesthesia. A small stab incision was made with 11 blade scalpel. The selected hemodialysis catheter was tunneled in a retrograde fashion from the anterior chest  wall to the venotomy incision. Serial dilation was performed and then a peel-away sheath was placed. The catheter was then placed through the peel-away sheath with tips ultimately positioned within the superior aspect of the right atrium. Final catheter positioning was confirmed and documented with a spot radiographic image. The catheter aspirates and flushes normally. The catheter was flushed with appropriate volume heparin dwells. The catheter exit site was secured with a 0-Prolene retention suture. Gel-Foam slurry was infused into the soft tissue tract. Because of the uremia and some mild oozing at the exit site on the chest wall additional suture was placed with 0 Prolene. The venotomy incision was closed Derma bond and sterile dressing. Dressings were applied at the chest wall. Patient tolerated the procedure well and remained hemodynamically stable throughout. No complications were encountered and no significant blood loss encountered. IMPRESSION: Status post image guided right IJ tunneled hemodialysis catheter. Signed, Yvone Neu. Miachel Roux, RPVI [facility] Radiology Electronically Signed   By: Gilmer Mor D.O.   On: 07/24/2022 10:19   IR US Guide Vasc Access Right  Result Date: 07/24/2022 INDICATION: 57 year old female with a history of renal failure referred for tunneled hemodialysis catheter EXAM: TUNNELED CENTRAL VENOUS HEMODIALYSIS CATHETER PLACEMENT WITH ULTRASOUND AND FLUOROSCOPIC GUIDANCE MEDICATIONS: Vancomycin 1 gm IV . The antibiotic was given in an appropriate time interval prior to skin puncture. ANESTHESIA/SEDATION: Moderate (conscious) sedation was employed during this procedure. A total of Versed 2.0 mg and Fentanyl 100 mcg was administered intravenously by the radiology nurse. Total intra-service moderate Sedation Time: 16 minutes. The patient's level of consciousness and vital signs were monitored continuously by radiology nursing  throughout the procedure under my direct supervision. FLUOROSCOPY: Radiation Exposure Index (as provided by the fluoroscopic device): 1 mGy Kerma COMPLICATIONS: None PROCEDURE: Informed written consent was obtained from the patient after a discussion of the risks, benefits, and alternatives to treatment. Questions regarding the procedure were encouraged and answered. The right neck and chest were prepped with chlorhexidine in a sterile fashion, and a sterile drape was applied covering the operative field. Maximum barrier sterile technique with sterile gowns and gloves were used for the procedure. A timeout was performed prior to the initiation of the procedure. Ultrasound survey was performed. The right internal jugular vein was confirmed to be patent, with images stored and sent to PACS. Micropuncture kit was utilized to access the right internal jugular vein under direct, real-time ultrasound guidance after the overlying soft tissues were anesthetized with 1% lidocaine with epinephrine. Stab incision was made with 11 blade scalpel. Microwire was passed centrally. The microwire was then marked to measure appropriate internal catheter length. External tunneled length was estimated. A total tip to cuff length of 19 cm was selected. 035 guidewire was advanced to the level of the IVC. Skin and subcutaneous tissues of chest wall below the clavicle were generously infiltrated with 1% lidocaine for local anesthesia. A small stab incision was made with 11 blade scalpel. The selected hemodialysis catheter was tunneled in a retrograde fashion from the anterior chest wall to the venotomy incision. Serial dilation was performed and then a peel-away sheath was placed. The catheter was then placed through the peel-away sheath with tips ultimately positioned within the superior aspect of the right atrium. Final catheter positioning was confirmed and documented with a spot radiographic image. The catheter aspirates and flushes  normally. The catheter was flushed with appropriate volume heparin dwells. The catheter exit site was secured with a 0-Prolene retention suture. Gel-Foam slurry was infused into the soft tissue tract. Because of the uremia and some mild oozing at the exit site on the chest wall additional suture was placed with 0 Prolene. The venotomy incision was closed Derma bond and sterile dressing. Dressings were applied at the chest wall. Patient tolerated the procedure well and remained hemodynamically stable throughout. No complications were encountered and no significant blood loss encountered. IMPRESSION: Status post image guided right IJ tunneled hemodialysis catheter. Signed, Yvone Neu. Miachel Roux, RPVI [facility] Radiology Electronically Signed   By: Gilmer Mor D.O.   On: 07/24/2022 10:19    Labs: BMET Recent Labs  Lab 07/20/22 0933 07/21/22 2129 07/22/22 0239 07/23/22 0202 07/23/22 1028 07/24/22 0301 07/25/22 0319  NA 136 135 130* 132*  --  134* 134*  K 3.2* 3.6 2.9* 2.6* 2.7* 2.9* 3.7  CL 110 108 104 102  --  103 99  CO2 8* 8* <7* 8*  --  13* 20*  GLUCOSE 136* 96 139* 126*  --  131* 94  BUN 138* 143* 140* 140*  --  136* 90*  CREATININE 11.74* 11.56* 11.74* 11.74*  --  11.15* 8.05*  CALCIUM 6.1* 6.0* 5.7* 6.4*  --  6.0* 7.0*  PHOS 11.5*  --  9.7* 9.4*  --   --  6.3*   CBC Recent Labs  Lab 07/21/22 2129 07/22/22 0239 07/23/22 0202 07/23/22 1028 07/24/22 0301 07/25/22 0319  WBC 19.9* 20.0* 11.9*  --  9.9 8.0  NEUTROABS 16.4* 16.2*  --   --   --   --   HGB 8.1* 7.0* 6.5* 7.5* 7.2* 7.5*  HCT 25.3* 21.7* 19.6* 21.6* 22.4* 22.6*  MCV 96.2 95.2 92.9  --  93.7 91.1  PLT 142* 122* 124*  --  115* 113*    Medications:     sodium chloride   Intravenous Once   carvedilol  25 mg Oral BID WC   Chlorhexidine Gluconate Cloth  6 each Topical Q0600   Chlorhexidine Gluconate Cloth  6 each Topical Q0600   pantoprazole  40 mg Oral BID AC    predniSONE  5 mg Oral Q breakfast   tacrolimus  1 mg Oral BID   vancomycin variable dose per unstable renal function (pharmacist dosing)   Does not apply See admin instructions    Estanislado Emms, MD 4:06 PM 07/25/2022

## 2022-07-25 NOTE — Care Management Important Message (Signed)
Important Message  Patient Details  Name: Christina Mack MRN: [id number] Date of Birth: 06-Feb-1966   Medicare Important Message Given:  Yes     Dorena Bodo 07/25/2022, 12:35 PM

## 2022-07-25 NOTE — Progress Notes (Signed)
Lower extremity venous left and upper extremity vein map study completed.   Please see CV Proc for preliminary results.   Jean Rosenthal, RDMS, RVT

## 2022-07-25 NOTE — Plan of Care (Signed)
Patient ID: EMIKO OSORTO, female   DOB: 06-24-1965, 57 y.o.   MRN: [id number]  Problem: Clinical Measurements: Goal: Complications related to the disease process or treatment will be avoided or minimized Outcome: Progressing Goal: Dialysis access will remain free of complications Outcome: Progressing    Lidia Collum, RN

## 2022-07-25 NOTE — Progress Notes (Signed)
PT Cancellation Note  Patient Details NameNICOSHA STRUVE Mack MRN: [id number] DOB: [birth year]   Cancelled Treatment:    Reason Eval/Treat Not Completed: Patient at procedure or test/unavailable. Pt currently off unit for HD. Will check back as schedule allows to continue with PT POC.   [name] 07/25/2022, 12:48 PM  Conni Slipper, PT, DPT Acute Rehabilitation Services Secure Chat Preferred Office: [phone]

## 2022-07-25 NOTE — Progress Notes (Signed)
Progress Note   Patient: Christina Mack [id number] DOB: Aug 25, 1965 DOA: 07/21/2022     3 DOS: the patient was seen and examined on 07/25/2022        Brief hospital course: Christina Mack is a 57 y.o. 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   4/20: Admitted, Neph consulted 4/21: IR consulted 4/22: TDC placed, underwent HD #1 4/23: HD #2     Assessment and Plan: * Acute renal failure superimposed on stage 4 chronic kidney disease - Consult Nephrology, appreciate expertise - HD per Nephrology  - Continue home tacrolimus and prednisone  Sepsis without end organ damage due to cellulitis and myofasciitis Presented with tachypnea, fever, leukocytosis. Does have renal failure, Cr >11.  Had a recent left thigh cellulitis, treated by her transplant team with doxycycline.  MRI today shows source is likely that same left leg cellulitis, emanating from the chronic osteomyelitis.  Has prior history PsA.  Is immunesuppressed. - Continue vancomycin and cefepime - Follow cultures data    Chronic osteomyelitis Developed after radiation for vulvar Ca in 2021.  Appears to be associated with some degree of chronic left lymphedema and cellulitis.  Has been admitted for sepsis from cellulitis twice, most recently in Dec 2023 (at that time had PsA bacteremia).    Evidently, planning for osteomyelitis resection is underway in order to enable a new kidney transplant.     Hypomagnesemia    Hypokalemia Resolved  History of anemia due to chronic kidney disease Hgb down to 6.5 4/22, transfused 1 unit - Hgb threshold 7 - Aranesp per nephrology  Hypocalcemia Improved after HD  Essential hypertension BP elevated - Continue Coreg  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VIN dx'd 2010.  In 2021,  developed a large vulvar mass, treated with resection, radiation.  Thrombocytopenia Mildly low, stable, no clinical bleeding.          Subjective: Still with a lot of left leg pain, redness and swelling.  No fever, no confsuion.  Nausea better.  HD again today.     Physical Exam: BP (!) 166/105 (BP Location: Right Arm)   Pulse 81   Temp 98.3 F (36.8 C) (Oral)   Resp 13   Ht  (1.651 m)   Wt 60.2 kg   SpO2 99%   BMI 22.09 kg/m   Thin elderly female, l thin elderlyying in bed, no acute distress RRR, no murmurs, no JVD Respiratory rate normal, lungs clear without rales or wheezes Abdomen exam is soft and benign, no tenderness or rigidity Attention normal, affect normal, judgment insight appear normal Bilateral lower extremities are mildly red and with some pitting edema   Data Reviewed: Discussed with nephrology Basic metabolic panel shows improved potassium, creatinine down to 8, calcium still somewhat low but corrects CBC unremarkable    Family Communication: None present    Disposition: Status is: Inpatient The patient was admitted with chronic renal failure, now requiring dialysis  In addition she also has a flareup of cellulitis and mild fasciitis from her osteomyelitis of the pubic ramus for which she is on broad-spectrum antibiotics  I suspect she will need continued dialysis and CLIP process and will continue antibiotics for a few more days.  Would likely need ID input by the end of the week to determine oral regimen  and duration        Author: Alberteen Sam, MD 07/25/2022 7:26 PM  For on call review [email or web address].

## 2022-07-25 NOTE — Progress Notes (Signed)
Post HD Note:   07/25/22 1417  Vitals  Temp 98.3 F (36.8 C)  Temp Source Oral  BP (!) 173/105  MAP (mmHg) 123  BP Location Right Arm  BP Method Automatic  Patient Position (if appropriate) Lying  Pulse Rate 83  Pulse Rate Source Monitor  ECG Heart Rate 83  Resp 18  Oxygen Therapy  SpO2 99 %  O2 Device Room Air  During Treatment Monitoring  Intra-Hemodialysis Comments Tx completed;Tolerated well  Post Treatment  Dialyzer Clearance Lightly streaked  Duration of HD Treatment -hour(s) 2.5 hour(s)  Liters Processed 37.5  Fluid Removed (mL) 1000 mL  Tolerated HD Treatment Yes  Hemodialysis Catheter Right Internal jugular Double lumen Permanent (Tunneled)  Placement Date/Time: 07/24/22 0913   Serial / Lot #: [id number]  Expiration Date: 02/07/27  Time Out: Correct patient;Correct site;Correct procedure  Maximum sterile barrier precautions: Hand hygiene;Cap;Mask;Sterile gown;Sterile gloves;Large sterile ...  Site Condition No complications  Blue Lumen Status Dead end cap in place;Heparin locked  Red Lumen Status Dead end cap in place;Heparin locked  Purple Lumen Status N/A  Catheter fill solution Heparin 1000 units/ml  Catheter fill volume (Arterial) 1.6 cc  Catheter fill volume (Venous) 1.6  Dressing Type Transparent  Dressing Status Antimicrobial disc in place;Clean, Dry, Intact  Interventions Other (Comment) (assessed)  Drainage Description None  Dressing Change Due 07/31/22  Post treatment catheter status Capped and Clamped

## 2022-07-25 NOTE — Progress Notes (Signed)
Pharmacy Antibiotic Note  Christina Mack is a 57 y.o. female admitted on 07/21/2022 with weakness, now w/ concern for sepsis.  Pharmacy has been consulted for vancomycin and cefepime dosing. New to HD - 1 st treatment 2 hours  Vancomycin random level 30 - > therapeutic  Plan: No additional vancomycin for now Resume cefepime 1g IV Q24H.  Height:  (165.1 cm) Weight: 61.5 kg (135 lb 9.3 oz) IBW/kg (Calculated) : 57  Temp (24hrs), Avg:98.1 F (36.7 C), Min:97.6 F (36.4 C), Max:98.7 F (37.1 C)  Recent Labs  Lab 07/21/22 2129 07/21/22 2157 07/21/22 2250 07/22/22 0239 07/23/22 0202 07/24/22 0301 07/25/22 0319  WBC 19.9*  --   --  20.0* 11.9* 9.9 8.0  CREATININE 11.56*  --   --  11.74* 11.74* 11.15* 8.05*  LATICACIDVEN  --  0.7 0.9  --   --   --   --   VANCORANDOM  --   --   --   --   --   --  30     Estimated Creatinine Clearance: 7.1 mL/min (A) (by C-G formula based on SCr of 8.05 mg/dL (H)).    Allergies  Allergen Reactions   Oxycodone-Acetaminophen Itching, Nausea And Vomiting, Nausea Only, Rash and Other (See Comments)    Headaches, also    Piperacillin-Tazobactam In Dex     Other Reaction(s): Other (See Comments)  Thrombocytopenia  Thrombocytopenia   Zosyn [Piperacillin Sod-Tazobactam So] Other (See Comments)    Thrombocytopenia   Hydromorphone Nausea And Vomiting, Rash and Other (See Comments)   Amlodipine Other (See Comments) and Swelling    Ankle swelling   Codeine     vomiting   Hydralazine     Headache   Zyvox [Linezolid] Other (See Comments)    Decreased platelet counts    Thank you for allowing pharmacy to be a part of this patient's care.  Okey Regal, PharmD 07/25/2022 8:06 AM

## 2022-07-25 NOTE — Assessment & Plan Note (Signed)
Mildly low, stable, no clinical bleeding.

## 2022-07-25 NOTE — Progress Notes (Signed)
Requested to see pt for out-pt HD needs at d/c. Met with pt at bedside while receiving HD. Introduced self and explained role. Discussed out-pt HD options. Pt voices that she would like to continue to be seen by Dr Marisue Humble and voices interest in TCU for education on home therapy options in the future. Referral submitted to [facility] admissions for review. Pt states that friends/family will likely be able to assist with transportation to/from HD. Pt also drives. Will assist as needed.   Olivia Canter Renal Navigator [phone]

## 2022-07-26 DIAGNOSIS — M8660 Other chronic osteomyelitis, unspecified site: Secondary | ICD-10-CM | POA: Diagnosis not present

## 2022-07-26 DIAGNOSIS — A46 Erysipelas: Secondary | ICD-10-CM | POA: Diagnosis not present

## 2022-07-26 DIAGNOSIS — R229 Localized swelling, mass and lump, unspecified: Secondary | ICD-10-CM

## 2022-07-26 DIAGNOSIS — D849 Immunodeficiency, unspecified: Secondary | ICD-10-CM

## 2022-07-26 DIAGNOSIS — G8929 Other chronic pain: Secondary | ICD-10-CM

## 2022-07-26 DIAGNOSIS — L03116 Cellulitis of left lower limb: Secondary | ICD-10-CM

## 2022-07-26 DIAGNOSIS — R109 Unspecified abdominal pain: Secondary | ICD-10-CM

## 2022-07-26 DIAGNOSIS — N184 Chronic kidney disease, stage 4 (severe): Secondary | ICD-10-CM | POA: Diagnosis not present

## 2022-07-26 DIAGNOSIS — Z96641 Presence of right artificial hip joint: Secondary | ICD-10-CM

## 2022-07-26 DIAGNOSIS — N179 Acute kidney failure, unspecified: Secondary | ICD-10-CM | POA: Diagnosis not present

## 2022-07-26 LAB — RENAL FUNCTION PANEL
Albumin: 2.1 g/dL — ABNORMAL LOW (ref 3.5–5.0)
Anion gap: 12 (ref 5–15)
BUN: 52 mg/dL — ABNORMAL HIGH (ref 6–20)
CO2: 22 mmol/L (ref 22–32)
Calcium: 7 mg/dL — ABNORMAL LOW (ref 8.9–10.3)
Chloride: 96 mmol/L — ABNORMAL LOW (ref 98–111)
Creatinine, Ser: 5.49 mg/dL — ABNORMAL HIGH (ref 0.44–1.00)
GFR, Estimated: 9 mL/min — ABNORMAL LOW (ref >60)
Glucose, Bld: 123 mg/dL — ABNORMAL HIGH (ref 70–99)
Phosphorus: 5.6 mg/dL — ABNORMAL HIGH (ref 2.5–4.6)
Potassium: 3.5 mmol/L (ref 3.5–5.1)
Sodium: 130 mmol/L — ABNORMAL LOW (ref 135–145)

## 2022-07-26 LAB — CBC
HCT: 23.2 % — ABNORMAL LOW (ref 36.0–46.0)
Hemoglobin: 7.6 g/dL — ABNORMAL LOW (ref 12.0–15.0)
MCH: 30.5 pg (ref 26.0–34.0)
MCHC: 32.8 g/dL (ref 30.0–36.0)
MCV: 93.2 fL (ref 80.0–100.0)
Platelets: 117 10*3/uL — ABNORMAL LOW (ref 150–400)
RBC: 2.49 MIL/uL — ABNORMAL LOW (ref 3.87–5.11)
RDW: 16.4 % — ABNORMAL HIGH (ref 11.5–15.5)
WBC: 7.8 10*3/uL (ref 4.0–10.5)
nRBC: 0 % (ref 0.0–0.2)

## 2022-07-26 LAB — CULTURE, BLOOD (ROUTINE X 2)
Culture: NO GROWTH
Special Requests: ADEQUATE

## 2022-07-26 LAB — PARATHYROID HORMONE, INTACT (NO CA): PTH: 620 pg/mL — ABNORMAL HIGH (ref 15–65)

## 2022-07-26 MED ORDER — VANCOMYCIN HCL 500 MG/100ML IV SOLN
500.0000 mg | Freq: Once | INTRAVENOUS | Status: AC
  Administered 2022-07-26: 500 mg via INTRAVENOUS
  Filled 2022-07-26: qty 100

## 2022-07-26 MED ORDER — CALCITRIOL 0.25 MCG PO CAPS: 0.2500 ug | ORAL_CAPSULE | ORAL | Status: DC

## 2022-07-26 MED ORDER — LISINOPRIL 20 MG PO TABS
20.0000 mg | ORAL_TABLET | Freq: Every day | ORAL | Status: DC
  Administered 2022-07-26 – 2022-07-27 (×2): 20 mg via ORAL
  Filled 2022-07-26 (×3): qty 1

## 2022-07-26 MED ORDER — CHLORHEXIDINE GLUCONATE CLOTH 2 % EX PADS
6.0000 | MEDICATED_PAD | Freq: Every day | CUTANEOUS | Status: DC
  Administered 2022-07-27: 6 via TOPICAL

## 2022-07-26 NOTE — TOC Initial Note (Signed)
Transition of Care [facility]) - Initial/Assessment Note    Patient Details  Name: Christina Mack MRN: [id number] Date of Birth: 06-08-1965  [facility] CM/SW Contact:    Tom-Johnson, Hershal Coria, RN Phone Number: 07/26/2022, 4:14 PM  Clinical Narrative:                  CM spoke with patient at bedside via phone about needs for post hospital transition.  Admitted for ARF on CKD 4. New dialysis start and clipping underway for outpatient HD. Patient states she will be driving self to and from dialysis and family will transport as well.  Denies any needs. No PT/OT f/u noted. CM will continue to follow as patient progresses with care towards discharge.       Expected Discharge Plan: Home/Self Care Barriers to Discharge: Continued Medical Work up   Patient Goals and CMS Choice Patient states their goals for this hospitalization and ongoing recovery are:: To return home CMS Medicare.gov Compare Post Acute Care list provided to:: Patient Choice offered to / list presented to : Patient      Expected Discharge Plan and Services   Discharge Planning Services: CM Consult Post Acute Care Choice: NA Living arrangements for the past 2 months: Single Family Home                 DME Arranged: N/A DME Agency: NA       HH Arranged: NA HH Agency: NA        Prior Living Arrangements/Services Living arrangements for the past 2 months: Single Family Home Lives with:: Spouse Patient language and need for interpreter reviewed:: Yes Do you feel safe going back to the place where you live?: Yes      Need for Family Participation in Patient Care: Yes (Comment) Care giver support system in place?: Yes (comment)   Criminal Activity/Legal Involvement Pertinent to Current Situation/Hospitalization: No - Comment as needed  Activities of Daily Living Home Assistive Devices/Equipment: Eyeglasses ADL Screening (condition at time of admission) Patient's cognitive ability adequate  to safely complete daily activities?: No Is the patient deaf or have difficulty hearing?: No Does the patient have difficulty seeing, even when wearing glasses/contacts?: No Does the patient have difficulty concentrating, remembering, or making decisions?: No Patient able to express need for assistance with ADLs?: Yes Does the patient have difficulty dressing or bathing?: No Independently performs ADLs?: Yes (appropriate for developmental age) Does the patient have difficulty walking or climbing stairs?: No Weakness of Legs: None Weakness of Arms/Hands: None  Permission Sought/Granted Permission sought to share information with : Case Manager, Family Supports Permission granted to share information with : Yes, Verbal Permission Granted              Emotional Assessment Appearance:: Appears stated age Attitude/Demeanor/Rapport: Engaged, Gracious Affect (typically observed): Accepting, Appropriate, Calm, Hopeful, Pleasant Orientation: : Oriented to Self, Oriented to Place, Oriented to  Time, Oriented to Situation Alcohol / Substance Use: Not Applicable Psych Involvement: No (comment)  Admission diagnosis:  Acute renal failure (ARF) [N17.9] Acute renal failure superimposed on stage 4 chronic kidney disease [N17.9, N18.4] Acute renal failure superimposed on chronic kidney disease, unspecified acute renal failure type, unspecified CKD stage [N17.9, N18.9] Patient Active Problem List   Diagnosis Date Noted   Hypokalemia 07/23/2022   Hypomagnesemia 07/23/2022   Acute renal failure superimposed on stage 4 chronic kidney disease 07/22/2022   Hypocalcemia 07/22/2022   Sepsis without end organ damage due to cellulitis and  myofasciitis 07/22/2022   History of anemia due to chronic kidney disease 07/22/2022   Gastritis and gastroduodenitis 05/27/2022   Cellulitis of right leg 05/26/2022   Lymphedema of right lower extremity 05/08/2022   Pain 03/23/2022   Renal failure 03/22/2022   Need  for emotional support 03/22/2022   Goals of care, counseling/discussion 03/22/2022   Chronic osteomyelitis 03/22/2022   Palliative care encounter 03/22/2022   Bacteremia due to Pseudomonas 03/22/2022   Pressure injury of skin 03/20/2022   AKI (acute kidney injury) 03/19/2022   Closed fracture of lateral portion of right tibial plateau 02/03/2022   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   Pancytopenia 01/19/2022   At risk for adverse drug interaction 10/13/2021   Long term (current) use of antibiotics 09/16/2021   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 (HCC) 05/17/2021   History of healed fragility fracture 05/17/2021   Leg weakness, bilateral 09/15/2020   Screening for malignant neoplasm of skin 05/31/2020   Immunocompromised state due to drug therapy 05/18/2020   Chronic kidney disease (CKD), stage IV (severe)    Chronic pain syndrome    Cancer associated pain    Debility    Thrombocytopenia 04/26/2020   Normocytic anemia 04/26/2020   CKD (chronic kidney disease) stage 4, GFR 15-29 ml/min 04/26/2020   Wound of right groin 04/26/2020   Gastric ulcer without hemorrhage or perforation 04/26/2020   Malnutrition of moderate degree 04/26/2020   Acute deep vein thrombosis (DVT) of femoral vein 12/05/2019   Iron deficiency anemia 11/10/2019   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11/06/2019   Sprain of anterior talofibular ligament of right ankle 11/22/2016   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 [place] [address] Phone: [phone] Fax: [phone]  [facility]  Pharmacy [address] [place] [address] Phone: [phone] Fax: [phone]  [facility] [address] [place] [address] Phone: [phone] Fax: [phone]     Social Determinants of Health (SDOH) Social History: SDOH Screenings   Food Insecurity: No Food Insecurity (07/22/2022)  Housing: Low Risk  (07/22/2022)  Transportation Needs: No Transportation Needs (07/22/2022)  Utilities: Not At Risk (07/22/2022)  Depression (PHQ2-9): Low Risk  (05/23/2022)  Tobacco Use: Low Risk  (07/24/2022)   SDOH Interventions: Transportation Interventions: Intervention Not Indicated, Inpatient TOC, Patient Resources (Friends/Family)   Readmission Risk Interventions    07/26/2022    4:11 PM 03/24/2022   11:54 AM  Readmission Risk Prevention Plan  Transportation Screening Complete Complete  Medication Review (RN Care Manager) Referral to Pharmacy Complete  PCP or Specialist appointment within 3-5 days of discharge Complete Complete  HRI or Home Care Consult Complete Complete  SW Recovery Care/Counseling Consult Complete Complete  Palliative Care Screening Not Applicable Not Applicable  Skilled Nursing Facility Not Applicable Not Applicable

## 2022-07-26 NOTE — Progress Notes (Signed)
PROGRESS NOTE    Christina Mack  ZOX:[id number] DOB: Nov 13, 1965 DOA: 07/21/2022 PCP: System, Provider Not In   Brief Narrative:  Mrs. Grudzien is a 57 y.o. 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     4/20: Admitted, Neph consulted 4/21: IR consulted 4/22: TDC placed, underwent HD #1 4/23: HD #2    Assessment & Plan:   Principal Problem:   Acute renal failure superimposed on stage 4 chronic kidney disease Active Problems:   Chronic osteomyelitis   Sepsis without end organ damage due to cellulitis and myofasciitis   Thrombocytopenia   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Essential hypertension   Hypocalcemia   History of anemia due to chronic kidney disease   Hypokalemia   Hypomagnesemia  Acute renal failure superimposed on stage 4 chronic kidney disease Patient now on dialysis, managed by nephrology, on TTS schedule.  Waiting for outpatient dialysis arrangements.  Continue home tacrolimus and prednisone.   Sepsis without end organ damage due to cellulitis and myofasciitis Presented with tachypnea, fever, leukocytosis. Does have renal failure, Cr >11.  Had a recent left thigh cellulitis, treated by her transplant team with doxycycline.  MRI shows source is likely that same left leg cellulitis, emanating from the chronic osteomyelitis.  Patient has remained on vancomycin and cefepime.  Will consult ID for further recommendations since she is known to their service.   Chronic osteomyelitis Developed after radiation for vulvar Ca in 2021.  Appears to be associated with some degree of chronic left lymphedema and cellulitis.  Has been admitted for sepsis from cellulitis twice, most recently in Dec 2023 (at that time had PsA bacteremia).    Evidently, planning for osteomyelitis resection is  underway in order to enable a new kidney transplant.   Hypomagnesemia: Resolved.   Hypokalemia Resolved   History of anemia due to chronic kidney disease Hgb down to 6.5 4/22, transfused 1 unit, hemoglobin over 7 since then.  Aranesp per nephrology.   Hypocalcemia Improved after HD   Essential hypertension BP elevated, on Coreg 25 mg p.o. twice daily.  Will add lisinopril 20 mg p.o. daily.  She is allergic to amlodipine.   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VIN dx'd 2010.  In 2021, developed a large vulvar mass, treated with resection, radiation.   Thrombocytopenia Mildly low, stable, no clinical bleeding.  DVT prophylaxis: SCDs Start: 07/22/22 0143   Code Status: Full Code  Family Communication:  None present at bedside.  Plan of care discussed with patient in length and he/she verbalized understanding and agreed with it.  Status is: Inpatient Remains inpatient appropriate because: Waiting for ID to see her and outpatient dialysis arrangements.   Estimated body mass index is 22.09 kg/m as calculated from the following:   Height as of this encounter: 5\' 5"  (1.651 m).   Weight as of this encounter: 60.2 kg.    Nutritional Assessment: Body mass index is 22.09 kg/m.Marland Kitchen Seen by dietician.  I agree with the assessment and plan as outlined below: Nutrition Status:        . Skin Assessment: I have examined the patient's skin and I agree with the wound assessment as performed by the wound care RN as outlined below:    Consultants:  ID and nephrology  Procedures:  As above  Antimicrobials:  Anti-infectives (From  admission, onward)    Start     Dose/Rate Route Frequency Ordered Stop   07/26/22 1015  vancomycin (VANCOREADY) IVPB 500 mg/100 mL        500 mg 100 mL/hr over 60 Minutes Intravenous  Once 07/26/22 0922     07/25/22 1000  ceFEPIme (MAXIPIME) 1 g in sodium chloride 0.9 % 100 mL IVPB        1 g 200 mL/hr over 30 Minutes Intravenous  Once 07/25/22 0906 07/25/22 1539   07/24/22  0900  vancomycin (VANCOCIN) IVPB 1000 mg/200 mL premix       Note to Pharmacy: Give in IR for surgical prophylaxis. DO NOT Give on Floor   1,000 mg 200 mL/hr over 60 Minutes Intravenous  Once 07/24/22 0758 07/24/22 0950   07/23/22 2200  ceFEPIme (MAXIPIME) 1 g in sodium chloride 0.9 % 100 mL IVPB        1 g 200 mL/hr over 30 Minutes Intravenous Every 24 hours 07/23/22 1811     07/23/22 1811  vancomycin variable dose per unstable renal function (pharmacist dosing)         Does not apply See admin instructions 07/23/22 1811     07/23/22 1115  cefTRIAXone (ROCEPHIN) 1 g in sodium chloride 0.9 % 100 mL IVPB  Status:  Discontinued        1 g 200 mL/hr over 30 Minutes Intravenous Every 24 hours 07/23/22 1018 07/23/22 1804   07/21/22 2300  ceFEPIme (MAXIPIME) 1 g in sodium chloride 0.9 % 100 mL IVPB  Status:  Discontinued        1 g 200 mL/hr over 30 Minutes Intravenous  Once 07/21/22 2247 07/21/22 2248   07/21/22 2300  metroNIDAZOLE (FLAGYL) IVPB 500 mg        500 mg 100 mL/hr over 60 Minutes Intravenous  Once 07/21/22 2247 07/22/22 0214   07/21/22 2300  vancomycin (VANCOCIN) IVPB 1000 mg/200 mL premix  Status:  Discontinued        1,000 mg 200 mL/hr over 60 Minutes Intravenous  Once 07/21/22 2247 07/21/22 2253   07/21/22 2300  ceFEPIme (MAXIPIME) 1 g in sodium chloride 0.9 % 100 mL IVPB  Status:  Discontinued        1 g 200 mL/hr over 30 Minutes Intravenous Every 24 hours 07/21/22 2248 07/23/22 1018   07/21/22 2300  vancomycin (VANCOREADY) IVPB 1250 mg/250 mL        1,250 mg 166.7 mL/hr over 90 Minutes Intravenous  Once 07/21/22 2253 07/22/22 0301   07/21/22 2249  vancomycin variable dose per unstable renal function (pharmacist dosing)  Status:  Discontinued         Does not apply See admin instructions 07/21/22 2249 07/23/22 1023         Subjective: Patient seen and examined.  She complains of some pain and redness in the right lower extremity.  No other  complaint.  Objective: Vitals:   07/25/22 1421 07/25/22 2204 07/26/22 0422 07/26/22 1000  BP: (!) 166/105 (!) 158/103 (!) 168/104 (!) 161/102  Pulse: 81 87 92 87  Resp: 13 14 16    Temp:  99.1 F (37.3 C) 99.1 F (37.3 C) 98.1 F (36.7 C)  TempSrc:  Oral Oral   SpO2: 99% 97% 97% 99%  Weight: 60.2 kg     Height:        Intake/Output Summary (Last 24 hours) at 07/26/2022 1033 Last data filed at 07/26/2022 0600 Gross per 24 hour  Intake 800 ml  Output 1200 ml  Net -400 ml   Filed Weights   07/24/22 1548 07/25/22 1131 07/25/22 1421  Weight: 61.5 kg 61.2 kg 60.2 kg    Examination:  General exam: Appears calm and comfortable  Respiratory system: Clear to auscultation. Respiratory effort normal. Cardiovascular system: S1 & S2 heard, RRR. No JVD, murmurs, rubs, gallops or clicks. No pedal edema. Gastrointestinal system: Abdomen is nondistended, soft and nontender. No organomegaly or masses felt. Normal bowel sounds heard. Central nervous system: Alert and oriented. No focal neurological deficits. Extremities: Scattered erythematous nodules in the left lower extremity.  No erythema on the right lower extremity. Psychiatry: Judgement and insight appear normal. Mood & affect appropriate.    Data Reviewed: I have personally reviewed following labs and imaging studies  CBC: Recent Labs  Lab 07/21/22 2129 07/22/22 0239 07/23/22 0202 07/23/22 1028 07/24/22 0301 07/25/22 0319 07/26/22 0118  WBC 19.9* 20.0* 11.9*  --  9.9 8.0 7.8  NEUTROABS 16.4* 16.2*  --   --   --   --   --   HGB 8.1* 7.0* 6.5* 7.5* 7.2* 7.5* 7.6*  HCT 25.3* 21.7* 19.6* 21.6* 22.4* 22.6* 23.2*  MCV 96.2 95.2 92.9  --  93.7 91.1 93.2  PLT 142* 122* 124*  --  115* 113* 117*   Basic Metabolic Panel: Recent Labs  Lab 07/20/22 0933 07/21/22 2129 07/22/22 0239 07/23/22 0202 07/23/22 1028 07/24/22 0301 07/25/22 0319 07/26/22 0118  NA 136   < > 130* 132*  --  134* 134* 130*  K 3.2*   < > 2.9* 2.6* 2.7*  2.9* 3.7 3.5  CL 110   < > 104 102  --  103 99 96*  CO2 8*   < > <7* 8*  --  13* 20* 22  GLUCOSE 136*   < > 139* 126*  --  131* 94 123*  BUN 138*   < > 140* 140*  --  136* 90* 52*  CREATININE 11.74*   < > 11.74* 11.74*  --  11.15* 8.05* 5.49*  CALCIUM 6.1*   < > 5.7* 6.4*  --  6.0* 7.0* 7.0*  MG  --   --  0.9*  --  1.3* 1.9  --   --   PHOS 11.5*  --  9.7* 9.4*  --   --  6.3* 5.6*   < > = values in this interval not displayed.   GFR: Estimated Creatinine Clearance: 10.4 mL/min (A) (by C-G formula based on SCr of 5.49 mg/dL (H)). Liver Function Tests: Recent Labs  Lab 07/21/22 2129 07/22/22 0239 07/23/22 0202 07/25/22 0319 07/26/22 0118  AST 8* 6*  --   --   --   ALT 6 8  --   --   --   ALKPHOS 71 56  --   --   --   BILITOT 0.5 0.8  --   --   --   PROT 7.7 6.5  --   --   --   ALBUMIN 2.7* 2.3* 2.1* 2.1* 2.1*   No results for input(s): "LIPASE", "AMYLASE" in the last 168 hours. No results for input(s): "AMMONIA" in the last 168 hours. Coagulation Profile: Recent Labs  Lab 07/21/22 2157  INR 1.3*   Cardiac Enzymes: No results for input(s): "CKTOTAL", "CKMB", "CKMBINDEX", "TROPONINI" in the last 168 hours. BNP (last 3 results) No results for input(s): "PROBNP" in the last 8760 hours. HbA1C: No results for input(s): "HGBA1C" in the last 72 hours. CBG: Recent Labs  Lab 07/24/22 1118  GLUCAP 131*   Lipid Profile: No results for input(s): "CHOL", "HDL", "LDLCALC", "TRIG", "CHOLHDL", "LDLDIRECT" in the last 72 hours. Thyroid Function Tests: No results for input(s): "TSH", "T4TOTAL", "FREET4", "T3FREE", "THYROIDAB" in the last 72 hours. Anemia Panel: No results for input(s): "VITAMINB12", "FOLATE", "FERRITIN", "TIBC", "IRON", "RETICCTPCT" in the last 72 hours. Sepsis Labs: Recent Labs  Lab 07/21/22 2157 07/21/22 2250 07/22/22 0239  PROCALCITON  --   --  5.94  LATICACIDVEN 0.7 0.9  --     Recent Results (from the past 240 hour(s))  Blood Culture (routine x 2)      Status: None   Collection Time: 07/21/22  9:57 PM   Specimen: BLOOD RIGHT ARM  Result Value Ref Range Status   Specimen Description BLOOD RIGHT ARM  Final   Special Requests   Final    BOTTLES DRAWN AEROBIC AND ANAEROBIC Blood Culture adequate volume   Culture   Final    NO GROWTH 5 DAYS Performed at [facility], [address] [place], [place] [address]    Report Status 07/26/2022 FINAL  Final  Blood Culture (routine x 2)     Status: None   Collection Time: 07/21/22  9:57 PM   Specimen: BLOOD  Result Value Ref Range Status   Specimen Description BLOOD BLOOD LEFT ARM  Final   Special Requests   Final    BOTTLES DRAWN AEROBIC ONLY Blood Culture adequate volume   Culture   Final    NO GROWTH 5 DAYS Performed at [facility], [address] [place], [place] [address]    Report Status 07/26/2022 FINAL  Final  Resp panel by RT-PCR (RSV, Flu A&B, Covid) Anterior Nasal Swab     Status: None   Collection Time: 07/21/22 10:22 PM   Specimen: Anterior Nasal Swab  Result Value Ref Range Status   SARS Coronavirus 2 by RT PCR NEGATIVE NEGATIVE Final   Influenza A by PCR NEGATIVE NEGATIVE Final   Influenza B by PCR NEGATIVE NEGATIVE Final    Comment: (NOTE) The Xpert Xpress SARS-CoV-2/FLU/RSV plus assay is intended as an aid in the diagnosis of influenza from Nasopharyngeal swab specimens and should not be used as a sole basis for treatment. Nasal washings and aspirates are unacceptable for Xpert Xpress SARS-CoV-2/FLU/RSV testing.  Fact Sheet for Patients: BloggerCourse.com  Fact Sheet for Healthcare Providers: SeriousBroker.it  This test is not yet approved or cleared by the [place] FDA and has been authorized for detection and/or diagnosis of SARS-CoV-2 by FDA under an Emergency Use Authorization (EUA). This EUA will remain in effect (meaning this test can be used) for the duration of the COVID-19 declaration  under Section 564(b)(1) of the Act, 21 U.S.C. section 360bbb-3(b)(1), unless the authorization is terminated or revoked.     Resp Syncytial Virus by PCR NEGATIVE NEGATIVE Final    Comment: (NOTE) Fact Sheet for Patients: BloggerCourse.com  Fact Sheet for Healthcare Providers: SeriousBroker.it  This test is not yet approved or cleared by the [place] FDA and has been authorized for detection and/or diagnosis of SARS-CoV-2 by FDA under an Emergency Use Authorization (EUA). This EUA will remain in effect (meaning this test can be used) for the duration of the COVID-19 declaration under Section 564(b)(1) of the Act, 21 U.S.C. section 360bbb-3(b)(1), unless the authorization is terminated or revoked.  Performed at [facility], [address], [place], [place] [address]   Urine Culture (for pregnant, neutropenic or  urologic patients or patients with an indwelling urinary catheter)     Status: Abnormal   Collection Time: 07/22/22  6:59 PM   Specimen: Urine, Clean Catch  Result Value Ref Range Status   Specimen Description URINE, CLEAN CATCH  Final   Special Requests   Final    NONE Performed at [facility], [address] [place], [place] [address]    Culture 10,000 COLONIES/mL PSEUDOMONAS AERUGINOSA (A)  Final   Report Status 07/25/2022 FINAL  Final   Organism ID, Bacteria PSEUDOMONAS AERUGINOSA (A)  Final      Susceptibility   Pseudomonas aeruginosa - MIC*    CEFTAZIDIME 4 SENSITIVE Sensitive     CIPROFLOXACIN INTERMEDIATE Intermediate     GENTAMICIN <=1 SENSITIVE Sensitive     IMIPENEM 2 SENSITIVE Sensitive     * 10,000 COLONIES/mL PSEUDOMONAS AERUGINOSA     Radiology Studies: VAS [place] LOWER EXTREMITY VENOUS (DVT)  Result Date: 07/25/2022  Lower Venous DVT Study Patient Name:  Christina Mack  Date of Exam:   07/25/2022 Medical Rec #: [id number]     Accession #:    [id number] Date of Birth: Jan 27, 1966    Patient  Gender: F Patient Age:   69 years Exam Location:  [facility] Procedure:      VAS [place] LOWER EXTREMITY VENOUS (DVT) Referring Phys: Joen Laura --------------------------------------------------------------------------------  Indications: Left leg pain, erythema.  Comparison Study: No prior studies. Performing Technologist: Jean Rosenthal RDMS, RVT  Examination Guidelines: A complete evaluation includes B-mode imaging, spectral Doppler, color Doppler, and power Doppler as needed of all accessible portions of each vessel. Bilateral testing is considered an integral part of a complete examination. Limited examinations for reoccurring indications may be performed as noted. The reflux portion of the exam is performed with the patient in reverse Trendelenburg.  +-----+---------------+---------+-----------+----------+--------------+ RIGHTCompressibilityPhasicitySpontaneityPropertiesThrombus Aging +-----+---------------+---------+-----------+----------+--------------+ CFV  Full           Yes      Yes                                 +-----+---------------+---------+-----------+----------+--------------+   +---------+---------------+---------+-----------+----------+--------------+ LEFT     CompressibilityPhasicitySpontaneityPropertiesThrombus Aging +---------+---------------+---------+-----------+----------+--------------+ CFV      Full           Yes      Yes                                 +---------+---------------+---------+-----------+----------+--------------+ SFJ      Full                                                        +---------+---------------+---------+-----------+----------+--------------+ FV Prox  Full                                                        +---------+---------------+---------+-----------+----------+--------------+ FV Mid   Full                                                         +---------+---------------+---------+-----------+----------+--------------+  FV DistalFull                                                        +---------+---------------+---------+-----------+----------+--------------+ PFV      Full                                                        +---------+---------------+---------+-----------+----------+--------------+ POP      Full           Yes      Yes                                 +---------+---------------+---------+-----------+----------+--------------+ PTV      Full                                                        +---------+---------------+---------+-----------+----------+--------------+ PERO     Full                                                        +---------+---------------+---------+-----------+----------+--------------+     Summary: RIGHT: - No evidence of common femoral vein obstruction.  LEFT: - There is no evidence of deep vein thrombosis in the lower extremity.  - No cystic structure found in the popliteal fossa.  *See table(s) above for measurements and observations. Electronically signed by Coral Else MD on 07/25/2022 at 7:31:05 PM.    Final    VAS [place] UPPER EXT VEIN MAPPING (PRE-OP AVF)  Result Date: 07/25/2022 UPPER EXTREMITY VEIN MAPPING Patient Name:  Christina Mack  Date of Exam:   07/25/2022 Medical Rec #: [id number]     Accession #:    [id number] Date of Birth: 1965-07-29    Patient Gender: F Patient Age:   64 years Exam Location:  [facility] Procedure:      VAS [place] UPPER EXT VEIN MAPPING (PRE-OP AVF) Referring Phys: Vallery Sa --------------------------------------------------------------------------------  Indications: Pre-access. History: ESRD.  Comparison Study: No prior studies. Performing Technologist: Jean Rosenthal RDMS, RVT  Examination Guidelines: A complete evaluation includes B-mode imaging, spectral Doppler, color Doppler, and power Doppler as needed of all accessible portions  of each vessel. Bilateral testing is considered an integral part of a complete examination. Limited examinations for reoccurring indications may be performed as noted. +-----------------+-------------+----------+---------+ Right Cephalic   Diameter (cm)Depth (cm)Findings  +-----------------+-------------+----------+---------+ Antecubital fossa    0.35        0.50             +-----------------+-------------+----------+---------+ Prox forearm         0.27        0.48             +-----------------+-------------+----------+---------+ Mid forearm          0.32  0.27             +-----------------+-------------+----------+---------+ Dist forearm         0.31        0.25   branching +-----------------+-------------+----------+---------+ Wrist                0.27        0.13             +-----------------+-------------+----------+---------+ +-----------------+-------------+----------+---------+ Right Basilic    Diameter (cm)Depth (cm)Findings  +-----------------+-------------+----------+---------+ Mid upper arm        0.63                         +-----------------+-------------+----------+---------+ Dist upper arm       0.58                         +-----------------+-------------+----------+---------+ Antecubital fossa    0.46               branching +-----------------+-------------+----------+---------+ Prox forearm         0.35                         +-----------------+-------------+----------+---------+ Mid forearm          0.24               branching +-----------------+-------------+----------+---------+ Distal forearm       0.22                         +-----------------+-------------+----------+---------+ Wrist                0.20                         +-----------------+-------------+----------+---------+ +-----------------+-------------+----------+---------+ Left Cephalic    Diameter (cm)Depth (cm)Findings   +-----------------+-------------+----------+---------+ Antecubital fossa    0.72        0.86   branching +-----------------+-------------+----------+---------+ Prox forearm         0.42        0.48   branching +-----------------+-------------+----------+---------+ Mid forearm          0.40        0.36             +-----------------+-------------+----------+---------+ Dist forearm         0.39        0.17             +-----------------+-------------+----------+---------+ Wrist                0.42        0.38   branching +-----------------+-------------+----------+---------+ +-----------------+-------------+----------+---------+ Left Basilic     Diameter (cm)Depth (cm)Findings  +-----------------+-------------+----------+---------+ Mid upper arm        0.62                         +-----------------+-------------+----------+---------+ Dist upper arm       0.56                         +-----------------+-------------+----------+---------+ Antecubital fossa    0.39               branching +-----------------+-------------+----------+---------+ Prox forearm         0.26               branching +-----------------+-------------+----------+---------+ Mid forearm  0.35                         +-----------------+-------------+----------+---------+ Distal forearm       0.37                         +-----------------+-------------+----------+---------+ Wrist                0.29                         +-----------------+-------------+----------+---------+ *See table(s) above for measurements and observations.  Diagnosing physician: Coral Else MD Electronically signed by Coral Else MD on 07/25/2022 at 7:28:52 PM.    Final     Scheduled Meds:  sodium chloride   Intravenous Once   carvedilol  25 mg Oral BID WC   Chlorhexidine Gluconate Cloth  6 each Topical Q0600   Chlorhexidine Gluconate Cloth  6 each Topical Q0600   pantoprazole  40 mg Oral BID  AC   predniSONE  5 mg Oral Q breakfast   tacrolimus  1 mg Oral BID   vancomycin variable dose per unstable renal function (pharmacist dosing)   Does not apply See admin instructions   Continuous Infusions:  ceFEPime (MAXIPIME) IV 1 g (07/25/22 2200)   vancomycin       LOS: 4 days   Hughie Closs, MD [facility]  07/26/2022, 10:33 AM   *Please note that this is a verbal dictation therefore any spelling or grammatical errors are due to the "Dragon Medical One" system interpretation.  Please page via Amion and do not message via secure chat for urgent patient care matters. Secure chat can be used for non urgent patient care matters.  How to contact the [facility] Attending or Consulting provider 7A - 7P or covering provider during after hours 7P -7A, for this patient?  Check the care team in [facility] and look for a) attending/consulting [facility] provider listed and b) the [facility] team listed. Page or secure chat 7A-7P. Log into [email or web address] and use [place]'s universal password to access. If you do not have the password, please contact the hospital operator. Locate the [facility] provider you are looking for under [facility] and page to a number that you can be directly reached. If you still have difficulty reaching the provider, please page the [facility] (Director on Call) for the Hospitalists listed on amion for assistance.

## 2022-07-26 NOTE — Procedures (Signed)
Patient was seen today for 48 hour suture removal. Per Dr Loreta Ave, suture at tunneled catheter chest exit site needed to be removed 48 hours after placement. Correct patient was identified. Suture at exit was identified. Suture was cut with scissors and successfully removed with forceps. Patient experienced minor discomfort during removal. No bleeding was observed at suture site following removal.  Kennieth Francois, PA-C 07/26/2022 3:04 PM

## 2022-07-26 NOTE — Progress Notes (Signed)
New Dialysis Start    Patient identified as new dialysis start. Kidney Education packet assembled and given. Discussed the following items with patient:     Current medications and possible changes once started:  Discussed that patient's medications may change over time.  Ex; hypertension medications and diabetes medication.  Nephrologists will adjust as needed.   Fluid restrictions reviewed:  32 oz daily goal:  All liquids count; soups, ice, jello, fruits.   Phosphorus and potassium: Handout given showing high potassium and phosphorus foods.  Alternative food and drink options given.   Family support:  None at bedside, supportive and will assist her in transport to outpatient Dialysis.   Outpatient Clinic Resources:  Discussed roles of Outpatient clinic staff and advised to make a list of needs, if any, to talk with outpatient staff if needed.   Care plan schedule: Informed patient of Care Plans in outpatient setting and to participate in the care plan.  An invitation would be given from outpatient clinic.    Dialysis Access Options:  Reviewed access options with patients. Discussed in detail about care at home with new AVG & AVF. Reviewed checking bruit and thrill. If dialysis catheter present, educated that patient could not take showers.  Catheter dressing changes were to be done by outpatient clinic staff only. Patient has TDC, holding off on permanent access in arm for now, per patient request.   Home therapy options:  Educated patient about home therapy options:  PD vs home hemo.  Patient interested in TCU program, Renal Navigator is aware.   Patient had transplants in 2001 and 2014. Patient also requested to speak with a dietitian for more information on her diet. Consult placed. Patient verbalized understanding of all material covered. Will continue to round on patient during admission.    Jean Rosenthal Dialysis Nurse Coordinator [phone]

## 2022-07-26 NOTE — Progress Notes (Signed)
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 Marisue Humble. Progression to ESRD especially in the context of possible early uremic symptoms. Renal txp u/s without acute findings.  RIJ tunn catheter placed 4/22.  - HD per TTS schedule for now  - Started CLIP process to locate outpatient HD unit  - ordered strict ins/outs - Nephrology has discussed permanent access however patient has previously declined.  We have discussed risks of dialysis with a catheter only.  We did obtain vein mapping.    Immunosuppression Management: -home IS: Tacrolimus 1mg  BID, prednisone 5 mg daily. - c/w home immunosuppression for now then will begin to titrate down as an outpatient  Hypocalcemia - improving with HD and normalizes with correction for hypoalbuminemia  - Intact PTH 620 - start calcitriol 0.25 mcg TTS with HD    Hypomag -replete prn  HypoK - improving with HD and higher K bath  - using 3K bath with HD for now - follow trends.     Metabolic acidosis -lactate WNL.  -will be managed with HD    SIRS - per primary team  - abx per primary team.  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 Blood cultures negative.  Urine with low colony count pseudomonas aeruginosa. On cefepime.   HTN   - optimize volume with HD; on coreg  - team has just started coreg   Anemia of CKD, thrombocytopenia -transfuse prn for Hgb <7.  Last dose of Retacrit 20,000 units was on 4/18, not due for ESA yet.  Would avoid IV Fe in the context of her needing antibiotics for now.  She confirms history of prior vulvar cancer - not current diagnosis    Disposition - continue inpatient monitoring.  She needs an outpatient HD unit.       Subjective:   Last HD on 4/23 with 1 kg UF.  She had 200 mL uop over 4/23 as well as 2 unmeasured urine voids.   She previously was on HD and had a catheter and states that this was due to "personal preference".  Vein mapping has been completed.  She states that she has taken lisinopril before- this was just started today per primary team.  I updated her husband at bedside.  Still thinking about the permanent access.   Review of systems:  Denies shortness of breath or chest pain  Denies n/v    Objective:   BP (!) 161/102   Pulse 87   Temp 98.1 F (36.7 C)   Resp 16   Ht 5\' 5"  (1.651 m)   Wt 60.2 kg   SpO2 99%   BMI 22.09 kg/m   Intake/Output Summary (Last 24 hours) at 07/26/2022 1607 Last data filed at 07/26/2022 1441 Gross per 24 hour  Intake 920 ml  Output 0 ml  Net 920 ml   Weight change: -0.8 kg  Physical Exam:    General adult female in bed in no acute distress HEENT normocephalic atraumatic extraocular movements intact sclera anicteric Neck supple trachea midline Lungs clear to auscultation bilaterally normal work of breathing at rest  Heart S1S2 no rub Abdomen soft nontender nondistended Extremities 1-2+ edema lower extremities Psych normal mood and affect Neuro - alert and oriented x 3; conversant; follows commands and provides hx  GU - no foley  Access RIJ tunn catheter  Imaging: VAS [place] LOWER EXTREMITY VENOUS (DVT)  Result Date: 07/25/2022  Lower Venous DVT Study Patient Name:  Christina Mack  Date of Exam:   07/25/2022 Medical Rec #: [id number]     Accession #:    [id number] Date of Birth: 1973/11/13    Patient Gender: F Patient Age:   57 years Exam Location:  [facility] Procedure:      VAS [place] LOWER EXTREMITY VENOUS (DVT) Referring Phys: Joen Laura --------------------------------------------------------------------------------  Indications: Left leg pain, erythema.  Comparison Study: No prior studies. Performing Technologist: Jean Rosenthal RDMS, RVT  Examination Guidelines: A complete evaluation includes B-mode imaging, spectral Doppler, color Doppler, and power  Doppler as needed of all accessible portions of each vessel. Bilateral testing is considered an integral part of a complete examination. Limited examinations for reoccurring indications may be performed as noted. The reflux portion of the exam is performed with the patient in reverse Trendelenburg.  +-----+---------------+---------+-----------+----------+--------------+ RIGHTCompressibilityPhasicitySpontaneityPropertiesThrombus Aging +-----+---------------+---------+-----------+----------+--------------+ CFV  Full           Yes      Yes                                 +-----+---------------+---------+-----------+----------+--------------+   +---------+---------------+---------+-----------+----------+--------------+ LEFT     CompressibilityPhasicitySpontaneityPropertiesThrombus Aging +---------+---------------+---------+-----------+----------+--------------+ CFV      Full           Yes      Yes                                 +---------+---------------+---------+-----------+----------+--------------+ SFJ      Full                                                        +---------+---------------+---------+-----------+----------+--------------+ FV Prox  Full                                                        +---------+---------------+---------+-----------+----------+--------------+ FV Mid   Full                                                        +---------+---------------+---------+-----------+----------+--------------+ FV DistalFull                                                        +---------+---------------+---------+-----------+----------+--------------+ PFV      Full                                                        +---------+---------------+---------+-----------+----------+--------------+ POP      Full  Yes      Yes                                  +---------+---------------+---------+-----------+----------+--------------+ PTV      Full                                                        +---------+---------------+---------+-----------+----------+--------------+ PERO     Full                                                        +---------+---------------+---------+-----------+----------+--------------+     Summary: RIGHT: - No evidence of common femoral vein obstruction.  LEFT: - There is no evidence of deep vein thrombosis in the lower extremity.  - No cystic structure found in the popliteal fossa.  *See table(s) above for measurements and observations. Electronically signed by Coral Else MD on 07/25/2022 at 7:31:05 PM.    Final    VAS [place] UPPER EXT VEIN MAPPING (PRE-OP AVF)  Result Date: 07/25/2022 UPPER EXTREMITY VEIN MAPPING Patient Name:  Christina Mack  Date of Exam:   07/25/2022 Medical Rec #: [id number]     Accession #:    [id number] Date of Birth: [birth year]    Patient Gender: F Patient Age:   57 years Exam Location:  [facility] Procedure:      VAS [place] UPPER EXT VEIN MAPPING (PRE-OP AVF) Referring Phys: Vallery Sa --------------------------------------------------------------------------------  Indications: Pre-access. History: ESRD.  Comparison Study: No prior studies. Performing Technologist: Jean Rosenthal RDMS, RVT  Examination Guidelines: A complete evaluation includes B-mode imaging, spectral Doppler, color Doppler, and power Doppler as needed of all accessible portions of each vessel. Bilateral testing is considered an integral part of a complete examination. Limited examinations for reoccurring indications may be performed as noted. +-----------------+-------------+----------+---------+ Right Cephalic   Diameter (cm)Depth (cm)Findings  +-----------------+-------------+----------+---------+ Antecubital fossa    0.35        0.50             +-----------------+-------------+----------+---------+ Prox forearm          0.27        0.48             +-----------------+-------------+----------+---------+ Mid forearm          0.32        0.27             +-----------------+-------------+----------+---------+ Dist forearm         0.31        0.25   branching +-----------------+-------------+----------+---------+ Wrist                0.27        0.13             +-----------------+-------------+----------+---------+ +-----------------+-------------+----------+---------+ Right Basilic    Diameter (cm)Depth (cm)Findings  +-----------------+-------------+----------+---------+ Mid upper arm        0.63                         +-----------------+-------------+----------+---------+ Dist upper arm       0.58                         +-----------------+-------------+----------+---------+  Antecubital fossa    0.46               branching +-----------------+-------------+----------+---------+ Prox forearm         0.35                         +-----------------+-------------+----------+---------+ Mid forearm          0.24               branching +-----------------+-------------+----------+---------+ Distal forearm       0.22                         +-----------------+-------------+----------+---------+ Wrist                0.20                         +-----------------+-------------+----------+---------+ +-----------------+-------------+----------+---------+ Left Cephalic    Diameter (cm)Depth (cm)Findings  +-----------------+-------------+----------+---------+ Antecubital fossa    0.72        0.86   branching +-----------------+-------------+----------+---------+ Prox forearm         0.42        0.48   branching +-----------------+-------------+----------+---------+ Mid forearm          0.40        0.36             +-----------------+-------------+----------+---------+ Dist forearm         0.39        0.17              +-----------------+-------------+----------+---------+ Wrist                0.42        0.38   branching +-----------------+-------------+----------+---------+ +-----------------+-------------+----------+---------+ Left Basilic     Diameter (cm)Depth (cm)Findings  +-----------------+-------------+----------+---------+ Mid upper arm        0.62                         +-----------------+-------------+----------+---------+ Dist upper arm       0.56                         +-----------------+-------------+----------+---------+ Antecubital fossa    0.39               branching +-----------------+-------------+----------+---------+ Prox forearm         0.26               branching +-----------------+-------------+----------+---------+ Mid forearm          0.35                         +-----------------+-------------+----------+---------+ Distal forearm       0.37                         +-----------------+-------------+----------+---------+ Wrist                0.29                         +-----------------+-------------+----------+---------+ *See table(s) above for measurements and observations.  Diagnosing physicianCoral Elseabham MD Electronically signed by Coral Else MD on 07/25/2022 at 7:28:52 PM.    Final     Labs: BMET Recent Labs  Lab 07/20/22 1610 07/21/22 2129 07/22/22 9604 07/23/22 5409  07/23/22 1028 07/24/22 0301 07/25/22 0319 07/26/22 0118  NA 136 135 130* 132*  --  134* 134* 130*  K 3.2* 3.6 2.9* 2.6* 2.7* 2.9* 3.7 3.5  CL 110 108 104 102  --  103 99 96*  CO2 8* 8* <7* 8*  --  13* 20* 22  GLUCOSE 136* 96 139* 126*  --  131* 94 123*  BUN 138* 143* 140* 140*  --  136* 90* 52*  CREATININE 11.74* 11.56* 11.74* 11.74*  --  11.15* 8.05* 5.49*  CALCIUM 6.1* 6.0* 5.7* 6.4*  --  6.0* 7.0* 7.0*  PHOS 11.5*  --  9.7* 9.4*  --   --  6.3* 5.6*   CBC Recent Labs  Lab 07/21/22 2129 07/22/22 0239 07/23/22 0202 07/23/22 1028 07/24/22 0301  07/25/22 0319 07/26/22 0118  WBC 19.9* 20.0* 11.9*  --  9.9 8.0 7.8  NEUTROABS 16.4* 16.2*  --   --   --   --   --   HGB 8.1* 7.0* 6.5* 7.5* 7.2* 7.5* 7.6*  HCT 25.3* 21.7* 19.6* 21.6* 22.4* 22.6* 23.2*  MCV 96.2 95.2 92.9  --  93.7 91.1 93.2  PLT 142* 122* 124*  --  115* 113* 117*    Medications:     sodium chloride   Intravenous Once   carvedilol  25 mg Oral BID WC   Chlorhexidine Gluconate Cloth  6 each Topical Q0600   Chlorhexidine Gluconate Cloth  6 each Topical Q0600   lisinopril  20 mg Oral Daily   pantoprazole  40 mg Oral BID AC   predniSONE  5 mg Oral Q breakfast   tacrolimus  1 mg Oral BID   vancomycin variable dose per unstable renal function (pharmacist dosing)   Does not apply See admin instructions    Estanislado Emms, MD 4:26 PM 07/26/2022

## 2022-07-26 NOTE — Consult Note (Signed)
[facility]    Date of Admission:  07/21/2022     Total days of antibiotics 4   Vanc 4/19 >> c   Cefepime 4/19 >> c               Reason for Consult: Cellulitis     Referring Provider: Pahwani  Primary Care Provider: System, Provider Not In    Assessment: Christina Mack is a 57 y.o. 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   LLE cellulitis / Erysipelas, Increased drainage from chronic open wound -  -Very tender erythematous swelling to left LLE.  -Would continue vancomycin + cefepime for 2 weeks to cover for new soft tissue infection and cellulitis. Labs have improved though she does not report much improvement in the condition of her leg.  -Elevation, cool compresses for pain relief.  -blood cultures negative  Tender Cutaneous Nodules - -First I am seeing these for Christina Mack. The nodules are tender and warm. I worry about atypical bacterial infection (NTM vs fungal) and recommended consideration of biopsy for more definitive diagnosis. Given waxing and waning nature and other medical issues she would like to defer this for now.   Chronic Pelvic OM/SA -  -hx of PsA (FQ, cefepime R) and VRE (remote).  -Undergoing evaluation for complex multidisciplinary surgical resection of ischial ramus with tissue flap with [facility] ortho onc, gyn onc and plastics teams. Last had long course of IV abx daptomycin + merrem Jan 31. MRI appears to read similarly to MRI ~6w ago obtained at [facility]  -blood cultures negative  H/O Rt THR -  -no signals of infection per tagged WBC scan in Dec 2023. Stable.   Rt leg lymphedema -  -no signs of infection here  Immunosuppression -  -on tacro and prednisone home doses     Plan: Continue vancomycin + cefepime (or  ceftazidime if the former not available) for 2 weeks, EOT: 08-04-22 Elevate LLE, cool compresses  She declined skin biopsy for now of nodule - will need to follow for any signs of advancement.      Principal Problem:   Acute renal failure superimposed on stage 4 chronic kidney disease Active Problems:   Thrombocytopenia   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Essential hypertension   Chronic osteomyelitis   Hypocalcemia   Sepsis without end organ damage due to cellulitis and myofasciitis   History of anemia due to chronic kidney disease   Hypokalemia   Hypomagnesemia    sodium chloride   Intravenous Once   carvedilol  25 mg Oral BID WC   Chlorhexidine Gluconate Cloth  6 each Topical Q0600   Chlorhexidine Gluconate Cloth  6 each Topical Q0600   lisinopril  20 mg Oral Daily   pantoprazole  40 mg Oral BID AC   predniSONE  5 mg Oral Q breakfast   tacrolimus  1 mg Oral BID   vancomycin variable dose per unstable renal function (pharmacist dosing)   Does not apply See admin instructions    HPI: Christina Mack is a 57 y.o. female admitted for malaise / fatigue from home.   Christina Mack is well known to our team and has been following with [place] for chronic osteomyelitis of the pelvis in the setting of chronic  open wound 2/2 radiation damage following vulvar cancer stage IIIc tx. She was last admitted in December 2023 for pseudomonas bacteremia and completed 6 weeks of IV daptomycin + merropenem Jan 31st of this year.  Has had a few bouts of LE cellulitis in the interim with development of lymphedema of the right leg. At present the left leg is newly the problem for her. She has noticed waxing / waning nodules that came up the last time she was in the [facility]. They are warm and tender and leave a scar.  Last antibiotic course was per transplant team at [facility] when she was given doxycycline for RLE cellulitis on 07/14/22 - 07/21/22.  She has had HD catheter placed and HD resumed this hospitalization. At presentation she had  leukocytosis with Fever 100.5, WBC 20K, Cr 11 and SIRS. Nothing has grown out of her blood cultures, she had a low colony count of pseudomonas 10K w/o GU sx.    Review of Systems: Review of Systems  Constitutional:  Positive for fever and malaise/fatigue.  Respiratory: Negative.    Cardiovascular:  Positive for leg swelling.  Genitourinary: Negative.   Musculoskeletal:        Leg pain LLE  Skin:  Positive for rash.  Neurological: Negative.   Endo/Heme/Allergies: Negative.   Psychiatric/Behavioral: Negative.      Past Medical History:  Diagnosis Date   Complication of anesthesia    nausea and vomiting   DVT (deep venous thrombosis)    History of osteomyelitis    Kidney transplant failure    Kidney transplant recipient    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Social History   Tobacco Use   Smoking status: Never   Smokeless tobacco: Never  Vaping Use   Vaping Use: Never used  Substance Use Topics   Alcohol use: Not Currently   Drug use: Never    Family History  Problem Relation Age of Onset   Cerebral aneurysm Mother    AAA (abdominal aortic aneurysm) Brother    Breast cancer Paternal Aunt    Heart attack Maternal Grandmother    Brain cancer Maternal Grandfather    Colon cancer Neg Hx    Ovarian cancer Neg Hx    Endometrial cancer Neg Hx    Pancreatic cancer Neg Hx    Prostate cancer Neg Hx    Allergies  Allergen Reactions   Oxycodone-Acetaminophen Itching, Nausea And Vomiting, Nausea Only, Rash and Other (See Comments)    Headaches, also    Piperacillin-Tazobactam In Dex     Other Reaction(s): Other (See Comments)  Thrombocytopenia  Thrombocytopenia   Zosyn [Piperacillin Sod-Tazobactam So] Other (See Comments)    Thrombocytopenia   Hydromorphone Nausea And Vomiting, Rash and Other (See Comments)   Amlodipine Other (See Comments) and Swelling    Ankle swelling   Codeine     vomiting   Hydralazine     Headache   Zyvox [Linezolid] Other (See Comments)    Decreased  platelet counts    OBJECTIVE: Blood pressure (!) 161/102, pulse 87, temperature 98.1 F (36.7 C), resp. rate 16, height  (1.651 m), weight 60.2 kg, SpO2 99 %.  Physical Exam Vitals reviewed.  Constitutional:      Appearance: Normal appearance. She is not ill-appearing.  Skin:    Comments: LLE with sunburn like erythema diffusely around ankle. Edema noted. Painful to touch.  Across lateral calf and thigh she has some tender warm red nodules that I have not seen before. No ulcerations to  skin, no drainage.   Neurological:     Mental Status: She is alert and oriented to person, place, and time.     Lab Results Lab Results  Component Value Date   WBC 7.8 07/26/2022   HGB 7.6 (L) 07/26/2022   HCT 23.2 (L) 07/26/2022   MCV 93.2 07/26/2022   PLT 117 (L) 07/26/2022    Lab Results  Component Value Date   CREATININE 5.49 (H) 07/26/2022   BUN 52 (H) 07/26/2022   NA 130 (L) 07/26/2022   K 3.5 07/26/2022   CL 96 (L) 07/26/2022   CO2 22 07/26/2022    Lab Results  Component Value Date   ALT 8 07/22/2022   AST 6 (L) 07/22/2022   ALKPHOS 56 07/22/2022   BILITOT 0.8 07/22/2022     Microbiology: Recent Results (from the past 240 hour(s))  Blood Culture (routine x 2)     Status: None   Collection Time: 07/21/22  9:57 PM   Specimen: BLOOD RIGHT ARM  Result Value Ref Range Status   Specimen Description BLOOD RIGHT ARM  Final   Special Requests   Final    BOTTLES DRAWN AEROBIC AND ANAEROBIC Blood Culture adequate volume   Culture   Final    NO GROWTH 5 DAYS Performed at [facility], [address] [place], [place] [address]    Report Status 07/26/2022 FINAL  Final  Blood Culture (routine x 2)     Status: None   Collection Time: 07/21/22  9:57 PM   Specimen: BLOOD  Result Value Ref Range Status   Specimen Description BLOOD BLOOD LEFT ARM  Final   Special Requests   Final    BOTTLES DRAWN AEROBIC ONLY Blood Culture adequate volume   Culture   Final    NO GROWTH  5 DAYS Performed at [facility], [address], [place], [place] [address]    Report Status 07/26/2022 FINAL  Final  Resp panel by RT-PCR (RSV, Flu A&B, Covid) Anterior Nasal Swab     Status: None   Collection Time: 07/21/22 10:22 PM   Specimen: Anterior Nasal Swab  Result Value Ref Range Status   SARS Coronavirus 2 by RT PCR NEGATIVE NEGATIVE Final   Influenza A by PCR NEGATIVE NEGATIVE Final   Influenza B by PCR NEGATIVE NEGATIVE Final    Comment: (NOTE) The Xpert Xpress SARS-CoV-2/FLU/RSV plus assay is intended as an aid in the diagnosis of influenza from Nasopharyngeal swab specimens and should not be used as a sole basis for treatment. Nasal washings and aspirates are unacceptable for Xpert Xpress SARS-CoV-2/FLU/RSV testing.  Fact Sheet for Patients: BloggerCourse.com  Fact Sheet for Healthcare Providers: SeriousBroker.it  This test is not yet approved or cleared by the [place] FDA and has been authorized for detection and/or diagnosis of SARS-CoV-2 by FDA under an Emergency Use Authorization (EUA). This EUA will remain in effect (meaning this test can be used) for the duration of the COVID-19 declaration under Section 564(b)(1) of the Act, 21 U.S.C. section 360bbb-3(b)(1), unless the authorization is terminated or revoked.     Resp Syncytial Virus by PCR NEGATIVE NEGATIVE Final    Comment: (NOTE) Fact Sheet for Patients: BloggerCourse.com  Fact Sheet for Healthcare Providers: SeriousBroker.it  This test is not yet approved or cleared by the [place] FDA and has been authorized for detection and/or diagnosis of SARS-CoV-2 by FDA under an Emergency Use Authorization (EUA). This EUA will remain in effect (meaning this test  can be used) for the duration of the COVID-19 declaration under Section 564(b)(1) of the Act, 21 U.S.C. section 360bbb-3(b)(1),  unless the authorization is terminated or revoked.  Performed at [facility], [address] [place], [place] [address]   Urine Culture (for pregnant, neutropenic or urologic patients or patients with an indwelling urinary catheter)     Status: Abnormal   Collection Time: 07/22/22  6:59 PM   Specimen: Urine, Clean Catch  Result Value Ref Range Status   Specimen Description URINE, CLEAN CATCH  Final   Special Requests   Final    NONE Performed at [facility], [address], [place], [place] [address]    Culture 10,000 COLONIES/mL PSEUDOMONAS AERUGINOSA (A)  Final   Report Status 07/25/2022 FINAL  Final   Organism ID, Bacteria PSEUDOMONAS AERUGINOSA (A)  Final      Susceptibility   Pseudomonas aeruginosa - MIC*    CEFTAZIDIME 4 SENSITIVE Sensitive     CIPROFLOXACIN INTERMEDIATE Intermediate     GENTAMICIN <=1 SENSITIVE Sensitive     IMIPENEM 2 SENSITIVE Sensitive     * 10,000 COLONIES/mL PSEUDOMONAS AERUGINOSA    Rexene Alberts, MSN, NP-C [facility]  [email or web address] Pager: [phone] Office: [phone] [facility] Main Line: [phone] *Secure Chat Communication Welcome

## 2022-07-26 NOTE — Plan of Care (Signed)
  Problem: Clinical Measurements: Goal: Complications related to the disease process or treatment will be avoided or minimized Outcome: Progressing Goal: Dialysis access will remain free of complications Outcome: Progressing   Problem: Education: Goal: Knowledge of disease and its progression will improve Outcome: Progressing   Problem: Fluid Volume: Goal: Compliance with measures to maintain balanced fluid volume will improve Outcome: Progressing   Problem: Health Behavior/Discharge Planning: Goal: Ability to manage health-related needs will improve Outcome: Progressing   Problem: Nutritional: Goal: Ability to make healthy dietary choices will improve Outcome: Progressing   Problem: Clinical Measurements: Goal: Complications related to the disease process, condition or treatment will be avoided or minimized Outcome: Progressing

## 2022-07-26 NOTE — Progress Notes (Signed)
PT Cancellation Note  Patient Details Name: Christina Mack MRN: [id number] DOB: 1965-12-26   Cancelled Treatment:    Reason Eval/Treat Not Completed: (P) Patient declined, no reason specified, pt tearful on arrival, stating "I'm fine" asking therapy to return tomorrow. Will check back tomorrow to continue with PT POC.  Lenora Boys. PTA Acute Rehabilitation Services Office: [phone]     Catalina Antigua 07/26/2022, 2:42 PM

## 2022-07-27 ENCOUNTER — Other Ambulatory Visit (HOSPITAL_COMMUNITY): Payer: Self-pay

## 2022-07-27 ENCOUNTER — Encounter (HOSPITAL_COMMUNITY): Payer: Self-pay

## 2022-07-27 DIAGNOSIS — N184 Chronic kidney disease, stage 4 (severe): Secondary | ICD-10-CM | POA: Diagnosis not present

## 2022-07-27 DIAGNOSIS — N179 Acute kidney failure, unspecified: Secondary | ICD-10-CM | POA: Diagnosis not present

## 2022-07-27 MED ORDER — CEFTAZIDIME IV (FOR PTA / DISCHARGE USE ONLY)
1.0000 g | INTRAVENOUS | 0 refills | Status: AC | PRN
Start: 2022-07-27 — End: 2022-08-05

## 2022-07-27 MED ORDER — ISOSORBIDE MONONITRATE ER 30 MG PO TB24
30.0000 mg | ORAL_TABLET | Freq: Every day | ORAL | 0 refills | Status: DC
  Filled 2022-07-27: qty 30, 30d supply, fill #0

## 2022-07-27 MED ORDER — VANCOMYCIN IV (FOR PTA / DISCHARGE USE ONLY)
500.0000 mg | INTRAVENOUS | 0 refills | Status: AC | PRN
Start: 2022-07-27 — End: 2022-08-05

## 2022-07-27 MED ORDER — LISINOPRIL 40 MG PO TABS
40.0000 mg | ORAL_TABLET | Freq: Every day | ORAL | 0 refills | Status: DC
  Filled 2022-07-27: qty 30, 30d supply, fill #0

## 2022-07-27 NOTE — Progress Notes (Signed)
[facility] Progress Note    Assessment/ Plan:   AKI on CKD5, now ESRD History of LRD 2001 (failed), second transplant DDKT 2014 -followed by Dr. Marisue Humble. Progression to ESRD especially in the context of possible early uremic symptoms. Renal txp u/s without acute findings.  RIJ tunn catheter placed 4/22.  - She has been accepted to the TCU unit at [facility].  She is to get HD per a Mon Tues Thurs Friday schedule outpatient at [facility].   - Nephrology has discussed permanent access however patient has previously declined.  We have discussed risks of dialysis with a catheter only.  We did obtain vein mapping.    Immunosuppression Management: -home IS: Tacrolimus 1mg  BID, prednisone 5 mg daily. - c/w home immunosuppression for now then will begin to titrate down as an outpatient  Hypocalcemia - improving with HD and normalizes with correction for hypoalbuminemia  - Intact PTH 620 - started calcitriol 0.25 mcg TTS with HD    Hypomag -replete prn  HypoK - improving with HD and higher K bath  - using 3K bath with HD for now - follow trends.     Metabolic acidosis -lactate WNL.  -will be managed with HD    Cellulitis:  Her outpatient regimen is to be: Vancomycin 500 mg/HD-M/Tu/Th/F + Ceftazidime 1g/HD-M/Tu/Th/F End date: 08/05/22 - per primary team and ID - appreciate their assistance  - abx per primary team.  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 Blood cultures negative   HTN   - optimize volume with HD; on coreg  - team has just started lisinopril 20 mg daily as of 4/24. This will need to be increased on outpatient HD unit follow-up  - Will go ahead and ask that RN give meds - these were held in anticipation of HD here.     Anemia of CKD, thrombocytopenia -transfuse prn for Hgb <7.  Last dose of Retacrit 20,000 units was on 4/18, not due for ESA yet.   - Continue ESA at outpatient HD unit.   - Would avoid  IV Fe in the context of her needing antibiotics for now but start upon completion.  She confirms history of prior vulvar cancer - not current diagnosis    Disposition - per primary team.  Stable for discharge from a strictly renal standpoint.         Subjective:   Last HD on 4/23 with 1 kg UF.  She had 4 unmeasured urine voids over 4/24.  She feels Mack and is eager to go home.  She is working with mobility specialist now.   Review of systems:  Denies shortness of breath or chest pain  Denies n/v Denies dizziness She has had tenderness of the left leg.      Objective:   BP (!) 172/100 (BP Location: Left Arm)   Pulse 96   Temp 98.1 F (36.7 C) (Oral)   Resp 16   Ht 5\' 5"  (1.651 m)   Wt 57.6 kg   SpO2 100%   BMI 21.13 kg/m   Intake/Output Summary (Last 24 hours) at 07/27/2022 1233 Last data filed at 07/27/2022 0900 Gross per 24 hour  Intake 360 ml  Output 0 ml  Net 360 ml   Weight change: -3.6 kg  Physical Exam:     General adult female in bed in no acute distress HEENT normocephalic atraumatic extraocular movements intact sclera anicteric Neck supple trachea midline Lungs clear to  auscultation bilaterally normal work of breathing at rest  Heart S1S2 no rub Abdomen soft nontender nondistended Extremities 2+ edema lower extremities Psych normal mood and affect Neuro - alert and oriented x 3; conversant; follows commands and provides hx  GU - no foley  Access RIJ tunn catheter  Imaging: [place] US LOWER EXTREMITY VENOUS (DVT)  Result Date: 07/25/2022  Lower Venous DVT Study Patient Name:  Christina Mack  Date of Exam:   07/25/2022 Medical Rec #: [id number]     Accession #:    [id number] Date of Birth: [birth year]    Patient Gender: F Patient Age:   56 years Exam Location:  [facility] Procedure:      VAS [place] LOWER EXTREMITY VENOUS (DVT) Referring Phys: Joen Laura --------------------------------------------------------------------------------  Indications: Left  leg pain, erythema.  Comparison Study: No prior studies. Performing Technologist: Jean Rosenthal RDMS, RVT  Examination Guidelines: A complete evaluation includes B-mode imaging, spectral Doppler, color Doppler, and power Doppler as needed of all accessible portions of each vessel. Bilateral testing is considered an integral part of a complete examination. Limited examinations for reoccurring indications may be performed as noted. The reflux portion of the exam is performed with the patient in reverse Trendelenburg.  +-----+---------------+---------+-----------+----------+--------------+ RIGHTCompressibilityPhasicitySpontaneityPropertiesThrombus Aging +-----+---------------+---------+-----------+----------+--------------+ CFV  Full           Yes      Yes                                 +-----+---------------+---------+-----------+----------+--------------+   +---------+---------------+---------+-----------+----------+--------------+ LEFT     CompressibilityPhasicitySpontaneityPropertiesThrombus Aging +---------+---------------+---------+-----------+----------+--------------+ CFV      Full           Yes      Yes                                 +---------+---------------+---------+-----------+----------+--------------+ SFJ      Full                                                        +---------+---------------+---------+-----------+----------+--------------+ FV Prox  Full                                                        +---------+---------------+---------+-----------+----------+--------------+ FV Mid   Full                                                        +---------+---------------+---------+-----------+----------+--------------+ FV DistalFull                                                        +---------+---------------+---------+-----------+----------+--------------+ PFV      Full                                                         +---------+---------------+---------+-----------+----------+--------------+  POP      Full           Yes      Yes                                 +---------+---------------+---------+-----------+----------+--------------+ PTV      Full                                                        +---------+---------------+---------+-----------+----------+--------------+ PERO     Full                                                        +---------+---------------+---------+-----------+----------+--------------+     Summary: RIGHT: - No evidence of common femoral vein obstruction.  LEFT: - There is no evidence of deep vein thrombosis in the lower extremity.  - No cystic structure found in the popliteal fossa.  *See table(s) above for measurements and observations. Electronically signed by Coral Else MD on 07/25/2022 at 7:31:05 PM.    Final    VAS [place] UPPER EXT VEIN MAPPING (PRE-OP AVF)  Result Date: 07/25/2022 UPPER EXTREMITY VEIN MAPPING Patient Name:  ARRIONA PREST  Date of Exam:   07/25/2022 Medical Rec #: [id number]     Accession #:    [id number] Date of Birth: February 10, 1966    Patient Gender: F Patient Age:   2 years Exam Location:  [facility] Procedure:      VAS [place] UPPER EXT VEIN MAPPING (PRE-OP AVF) Referring Phys: Vallery Sa --------------------------------------------------------------------------------  Indications: Pre-access. History: ESRD.  Comparison Study: No prior studies. Performing Technologist: Jean Rosenthal RDMS, RVT  Examination Guidelines: A complete evaluation includes B-mode imaging, spectral Doppler, color Doppler, and power Doppler as needed of all accessible portions of each vessel. Bilateral testing is considered an integral part of a complete examination. Limited examinations for reoccurring indications may be performed as noted. +-----------------+-------------+----------+---------+ Right Cephalic   Diameter (cm)Depth (cm)Findings   +-----------------+-------------+----------+---------+ Antecubital fossa    0.35        0.50             +-----------------+-------------+----------+---------+ Prox forearm         0.27        0.48             +-----------------+-------------+----------+---------+ Mid forearm          0.32        0.27             +-----------------+-------------+----------+---------+ Dist forearm         0.31        0.25   branching +-----------------+-------------+----------+---------+ Wrist                0.27        0.13             +-----------------+-------------+----------+---------+ +-----------------+-------------+----------+---------+ Right Basilic    Diameter (cm)Depth (cm)Findings  +-----------------+-------------+----------+---------+ Mid upper arm        0.63                         +-----------------+-------------+----------+---------+  Dist upper arm       0.58                         +-----------------+-------------+----------+---------+ Antecubital fossa    0.46               branching +-----------------+-------------+----------+---------+ Prox forearm         0.35                         +-----------------+-------------+----------+---------+ Mid forearm          0.24               branching +-----------------+-------------+----------+---------+ Distal forearm       0.22                         +-----------------+-------------+----------+---------+ Wrist                0.20                         +-----------------+-------------+----------+---------+ +-----------------+-------------+----------+---------+ Left Cephalic    Diameter (cm)Depth (cm)Findings  +-----------------+-------------+----------+---------+ Antecubital fossa    0.72        0.86   branching +-----------------+-------------+----------+---------+ Prox forearm         0.42        0.48   branching +-----------------+-------------+----------+---------+ Mid forearm           0.40        0.36             +-----------------+-------------+----------+---------+ Dist forearm         0.39        0.17             +-----------------+-------------+----------+---------+ Wrist                0.42        0.38   branching +-----------------+-------------+----------+---------+ +-----------------+-------------+----------+---------+ Left Basilic     Diameter (cm)Depth (cm)Findings  +-----------------+-------------+----------+---------+ Mid upper arm        0.62                         +-----------------+-------------+----------+---------+ Dist upper arm       0.56                         +-----------------+-------------+----------+---------+ Antecubital fossa    0.39               branching +-----------------+-------------+----------+---------+ Prox forearm         0.26               branching +-----------------+-------------+----------+---------+ Mid forearm          0.35                         +-----------------+-------------+----------+---------+ Distal forearm       0.37                         +-----------------+-------------+----------+---------+ Wrist                0.29                         +-----------------+-------------+----------+---------+ *See table(s) above for measurements and observations.  Diagnosing physician: Faylene Million  Myra Gianotti MD Electronically signed by Coral Else MD on 07/25/2022 at 7:28:52 PM.    Final     Labs: BMET Recent Labs  Lab 07/21/22 2129 07/22/22 0239 07/23/22 0202 07/23/22 1028 07/24/22 0301 07/25/22 0319 07/26/22 0118  NA 135 130* 132*  --  134* 134* 130*  K 3.6 2.9* 2.6* 2.7* 2.9* 3.7 3.5  CL 108 104 102  --  103 99 96*  CO2 8* <7* 8*  --  13* 20* 22  GLUCOSE 96 139* 126*  --  131* 94 123*  BUN 143* 140* 140*  --  136* 90* 52*  CREATININE 11.56* 11.74* 11.74*  --  11.15* 8.05* 5.49*  CALCIUM 6.0* 5.7* 6.4*  --  6.0* 7.0* 7.0*  PHOS  --  9.7* 9.4*  --   --  6.3* 5.6*   CBC Recent Labs  Lab  07/21/22 2129 07/22/22 0239 07/23/22 0202 07/23/22 1028 07/24/22 0301 07/25/22 0319 07/26/22 0118  WBC 19.9* 20.0* 11.9*  --  9.9 8.0 7.8  NEUTROABS 16.4* 16.2*  --   --   --   --   --   HGB 8.1* 7.0* 6.5* 7.5* 7.2* 7.5* 7.6*  HCT 25.3* 21.7* 19.6* 21.6* 22.4* 22.6* 23.2*  MCV 96.2 95.2 92.9  --  93.7 91.1 93.2  PLT 142* 122* 124*  --  115* 113* 117*    Medications:     sodium chloride   Intravenous Once   calcitRIOL  0.25 mcg Oral Q T,Th,Sa-HD   carvedilol  25 mg Oral BID WC   Chlorhexidine Gluconate Cloth  6 each Topical Q0600   Chlorhexidine Gluconate Cloth  6 each Topical Q0600   Chlorhexidine Gluconate Cloth  6 each Topical Q0600   lisinopril  20 mg Oral Daily   pantoprazole  40 mg Oral BID AC   predniSONE  5 mg Oral Q breakfast   tacrolimus  1 mg Oral BID   vancomycin variable dose per unstable renal function (pharmacist dosing)   Does not apply See admin instructions    Estanislado Emms, MD 12:53 PM 07/27/2022

## 2022-07-27 NOTE — TOC Transition Note (Signed)
Transition of Care [facility]) - CM/SW Discharge Note   Patient Details  Name: Christina Mack MRN: [id number] Date of Birth: 10-Apr-1965  Transition of Care [facility]) CM/SW Contact:  Tom-Johnson, Hershal Coria, RN Phone Number: 07/27/2022, 12:22 PM   Clinical Narrative:     Patient is scheduled for discharge today.  Readmission Prevention Assessment done.  Hospital f/u and discharge instructions on AVS.  Patient to continue abx x 2 weeks in outpatient dialysis. Spouse, Caryn Bee to transport at discharge. No further TOC needs noted.    Final next level of care: Home/Self Care Barriers to Discharge: Barriers Resolved   Patient Goals and CMS Choice CMS Medicare.gov Compare Post Acute Care list provided to:: Patient Choice offered to / list presented to : Patient  Discharge Placement                  Patient to be transferred to facility by: Spouse Name of family member notified: Madie Reno    Discharge Plan and Services Additional resources added to the After Visit Summary for     Discharge Planning Services: CM Consult Post Acute Care Choice: NA          DME Arranged: N/A DME Agency: NA       HH Arranged: NA HH Agency: NA        Social Determinants of Health (SDOH) Interventions SDOH Screenings   Food Insecurity: No Food Insecurity (07/22/2022)  Housing: Low Risk  (07/22/2022)  Transportation Needs: No Transportation Needs (07/22/2022)  Utilities: Not At Risk (07/22/2022)  Depression (PHQ2-9): Low Risk  (05/23/2022)  Tobacco Use: Low Risk  (07/24/2022)     Readmission Risk Interventions    07/26/2022    4:11 PM 03/24/2022   11:54 AM  Readmission Risk Prevention Plan  Transportation Screening Complete Complete  Medication Review (RN Care Manager) Referral to Pharmacy Complete  PCP or Specialist appointment within 3-5 days of discharge Complete Complete  HRI or Home Care Consult Complete Complete  SW Recovery Care/Counseling Consult Complete Complete  Palliative  Care Screening Not Applicable Not Applicable  Skilled Nursing Facility Not Applicable Not Applicable

## 2022-07-27 NOTE — Progress Notes (Signed)
Mobility Specialist Progress Note   07/27/22 1251  Mobility  Activity Ambulated with assistance in hallway  Level of Assistance Contact guard assist, steadying assist  Assistive Device Front wheel walker  Distance Ambulated (ft) 160 ft  Activity Response Tolerated well  Mobility Referral Yes  $Mobility charge 1 Mobility   Received pt in bed having c/o pain in LLE but agreeable. Able to get EOB w/ stand by A and ambulated in hallway w/ CGA. Pt having antalgic like gait throughout but no LOB. Returned to bed w/o fault and all needs met.            Frederico Hamman Mobility Specialist Please contact via SecureChat or  Rehab office at [phone]

## 2022-07-27 NOTE — Progress Notes (Signed)
Occupational Therapy Treatment Patient Details Name: Christina Mack MRN: [id number] DOB: 01/23/1966 Today's Date: 07/27/2022   History of present illness 57 y/o F admitted to [facility] on 4/19 for weakness in setting of acute renal failure superimposed on CKD stage V. Plans for placement of temporary catheter and initiation of HD on 4/22. PMHx: renal transplant in 2014, CKD, HTN, anemia, osteomyelitis of the pubis   OT comments  Patient requiring increased time to get to EOB due to LLE pain but no physical assistance. Patient requiring HHA for mobility to sink and supervision while standing at sink. Toilet transfer perform with min guard assist and supervision for clothing management and hygiene. Energy conservation strategies handout provided and reviewed with patient. Patient limited on this date due to pain but continues to demonstrate progress. Acute OT to continue to follow.    Recommendations for follow up therapy are one component of a multi-disciplinary discharge planning process, led by the attending physician.  Recommendations may be updated based on patient status, additional functional criteria and insurance authorization.    Assistance Recommended at Discharge PRN  Patient can return home with the following  Assistance with cooking/housework;Assist for transportation;Help with stairs or ramp for entrance   Equipment Recommendations  None recommended by OT    Recommendations for Other Services      Precautions / Restrictions Precautions Precautions: Fall Precaution Comments: low fall risk Restrictions Weight Bearing Restrictions: No       Mobility Bed Mobility Overal bed mobility: Modified Independent                  Transfers Overall transfer level: Needs assistance Equipment used: 1 person hand held assist Transfers: Sit to/from Stand, Bed to chair/wheelchair/BSC Sit to Stand: Min guard           General transfer comment: HHA for mobility and transfers due  to LLE pain requiring support     Balance Overall balance assessment: Needs assistance Sitting-balance support: No upper extremity supported, Feet supported Sitting balance-Leahy Scale: Good     Standing balance support: Single extremity supported, No upper extremity supported, During functional activity Standing balance-Leahy Scale: Fair Standing balance comment: single extemity support for dynamic balance, static standing without UE support                           ADL either performed or assessed with clinical judgement   ADL Overall ADL's : Needs assistance/impaired     Grooming: Wash/dry hands;Wash/dry face;Oral care;Brushing hair;Supervision/safety;Standing Grooming Details (indicate cue type and reason): at sink                 Toilet Transfer: Min guard;Ambulation;BSC/3in1   Toileting- Architect and Hygiene: Supervision/safety;Sitting/lateral lean;Sit to/from Statistician Details (indicate cue type and reason): able to manage clothing and perform toilet hygiene       General ADL Comments: HHA due to LLE pain    Extremity/Trunk Assessment              Vision       Perception     Praxis      Cognition Arousal/Alertness: Awake/alert Behavior During Therapy: Flat affect Overall Cognitive Status: Within Functional Limits for tasks assessed                                 General Comments: A&Ox4  Exercises      Shoulder Instructions       General Comments Provided handout for energy conservation strategies and reviewed with patient    Pertinent Vitals/ Pain       Pain Assessment Pain Assessment: 0-10 Pain Score: 5  Pain Location: LLE Pain Descriptors / Indicators: Aching, Discomfort, Throbbing Pain Intervention(s): Limited activity within patient's tolerance, Monitored during session, Repositioned  Home Living                                           Prior Functioning/Environment              Frequency  Min 2X/week        Progress Toward Goals  OT Goals(current goals can now be found in the care plan section)  Progress towards OT goals: Progressing toward goals  Acute Rehab OT Goals Patient Stated Goal: get better OT Goal Formulation: With patient Time For Goal Achievement: 08/06/22 Potential to Achieve Goals: Good ADL Goals Additional ADL Goal #1: Pt to verbalize at least 3 energy conservation strategies to implement during daily routine. Additional ADL Goal #2: Pt to demonstrate ability to manage ADLs/mobility MOD I without LOB or safety concerns  Plan Discharge plan remains appropriate    Co-evaluation                 AM-PAC OT "6 Clicks" Daily Activity     Outcome Measure   Help from another person eating meals?: None Help from another person taking care of personal grooming?: A Little Help from another person toileting, which includes using toliet, bedpan, or urinal?: A Little Help from another person bathing (including washing, rinsing, drying)?: A Little Help from another person to put on and taking off regular upper body clothing?: None Help from another person to put on and taking off regular lower body clothing?: A Little 6 Click Score: 20    End of Session Equipment Utilized During Treatment: Gait belt  OT Visit Diagnosis: Other abnormalities of gait and mobility (R26.89)   Activity Tolerance Patient tolerated treatment well;Patient limited by pain   Patient Left in bed;with call bell/phone within reach (seated on EOB)   Nurse Communication Mobility status        Time: 1610-9604 OT Time Calculation (min): 20 min  Charges: OT General Charges $OT Visit: 1 Visit OT Treatments $Self Care/Home Management : 8-22 mins  Alfonse Flavors, OTA Acute Rehabilitation Services  Office [phone]   Dewain Penning 07/27/2022, 8:31 AM

## 2022-07-27 NOTE — Discharge Summary (Signed)
Physician Discharge Summary  Christina Mack:[id number] DOB: [birth year] DOA: 07/21/2022  PCP: System, Provider Not In  Admit date: 07/21/2022 Discharge date: 07/27/2022 30 Day Unplanned Readmission Risk Score    Flowsheet Row ED to Hosp-Admission (Current) from 07/21/2022 in [facility] 76M KIDNEY UNIT  30 Day Unplanned Readmission Risk Score (%) 43.26 Filed at 07/27/2022 1200       This score is the patient's risk of an unplanned readmission within 30 days of being discharged (0 -100%). The score is based on dignosis, age, lab data, medications, orders, and past utilization.   Low:  0-14.9   Medium: 15-21.9   High: 22-29.9   Extreme: 30 and above          Admitted From: Home Disposition: Home  Recommendations for Outpatient Follow-up:  Follow up with PCP in 1-2 weeks Please obtain BMP/CBC in one week Follow-up with nephrology for outpatient dialysis. Please follow up with your PCP on the following pending results: Unresulted Labs (From admission, onward)    None         Home Health: None Equipment/Devices: None  Discharge Condition: Stable CODE STATUS: Full code Diet recommendation: Renal  Subjective: Patient seen and examined.  She has no complaints.  She is eager to go home.  Brief/Interim Summary: Christina Mack is a 57 y.o. 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  4/20: Admitted, Neph consulted 4/21: IR consulted 4/22: TDC placed, underwent HD #1 4/23: HD #2   Acute renal failure superimposed on stage 4 chronic kidney disease Patient now on dialysis, managed by nephrology, she has outpatient dialysis arrangements made.  She is cleared from nephrology and will continue her outpatient dialysis at TCU at [facility] on MTTh[facility] schedule    Sepsis without end  organ damage due to cellulitis and myofasciitis Presented with tachypnea, fever, leukocytosis. Does have renal failure, Cr >11.  Had a recent left thigh cellulitis, treated by her transplant team with doxycycline.  MRI shows source is likely that same left leg cellulitis, emanating from the chronic osteomyelitis.  Patient has remained on vancomycin and cefepime.  ID consulted.  Since she had tender nodules on the left lower extremity, ID was concerned about disseminated bacterial or fungal infection and recommended biopsy which patient declined.  ID then recommended 2 weeks of cefepime and vancomycin.  She will receive this with dialysis.   Chronic osteomyelitis Developed after radiation for vulvar Ca in 2021.  Appears to be associated with some degree of chronic left lymphedema and cellulitis.  Has been admitted for sepsis from cellulitis twice, most recently in Dec 2023 (at that time had PsA bacteremia).    Evidently, planning for osteomyelitis resection is underway in order to enable a new kidney transplant.   Hypomagnesemia: Resolved.   Hypokalemia Resolved   History of anemia due to chronic kidney disease Hgb down to 6.5 4/22, transfused 1 unit, hemoglobin over 7 since then.  Aranesp per nephrology.   Hypocalcemia Improved after HD   Essential hypertension Patient is allergic to amlodipine as well as hydralazine, she was on Coreg 25 mg p.o. twice daily.  Blood pressure was still elevated so she was added on lisinopril yesterday, due to still elevated blood pressure, now I am discharging her on lisinopril 40 mg as well as  Imdur 30 mg p.o. daily.     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VIN dx'd 2010.  In 2021, developed a large vulvar mass, treated with resection, radiation.   Thrombocytopenia Mildly low, stable, no clinical bleeding.  Discharge plan was discussed with patient and/or family member and they verbalized understanding and agreed with it.  Discharge Diagnoses:  Principal Problem:   Acute  renal failure superimposed on stage 4 chronic kidney disease Active Problems:   Chronic osteomyelitis   Sepsis without end organ damage due to cellulitis and myofasciitis   Thrombocytopenia   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Essential hypertension   Hypocalcemia   History of anemia due to chronic kidney disease   Hypokalemia   Hypomagnesemia    Discharge Instructions  Discharge Instructions     Home infusion instructions   Complete by: As directed    Instructions: Flushing of vascular access device: 0.9% NaCl pre/post medication administration and prn patency; Heparin 100 u/ml, 5ml for implanted ports and Heparin 10u/ml, 5ml for all other central venous catheters.      Allergies as of 07/27/2022       Reactions   Oxycodone-acetaminophen Itching, Nausea And Vomiting, Nausea Only, Rash, Other (See Comments)   Headaches, also   Piperacillin-tazobactam In Dex    Other Reaction(s): Other (See Comments) Thrombocytopenia  Thrombocytopenia   Zosyn [piperacillin Sod-tazobactam So] Other (See Comments)   Thrombocytopenia   Hydromorphone Nausea And Vomiting, Rash, Other (See Comments)   Amlodipine Other (See Comments), Swelling   Ankle swelling   Codeine    vomiting   Hydralazine    Headache   Zyvox [linezolid] Other (See Comments)   Decreased platelet counts        Medication List     STOP taking these medications    acetaminophen 500 MG tablet Commonly known as: TYLENOL   amoxicillin-clavulanate 500-125 MG tablet Commonly known as: AUGMENTIN   doxycycline 100 MG tablet Commonly known as: VIBRA-TABS       TAKE these medications    carvedilol 12.5 MG tablet Commonly known as: COREG Take 2 tablets (25 mg total) by mouth 2 (two) times daily with a meal.   cefTAZidime  IVPB Commonly known as: FORTAZ Inject 1 g into the vein every dialysis for up to 9 days. Indication:  Cellulitis Last Day of Therapy:  08/05/22 Method of administration: Per HD protocol after HD on  Mon/Tue/Thu/Fri   isosorbide mononitrate 30 MG 24 hr tablet Commonly known as: IMDUR Take 1 tablet (30 mg total) by mouth daily.   lisinopril 40 MG tablet Commonly known as: ZESTRIL Take 1 tablet (40 mg total) by mouth daily.   pantoprazole 40 MG tablet Commonly known as: PROTONIX TAKE 1 TABLET (40 MG TOTAL) BY MOUTH 2 (TWO) TIMES DAILY BEFORE A MEAL. What changed: how much to take   predniSONE 5 MG tablet Commonly known as: DELTASONE Take 5 mg by mouth daily with breakfast.   Prograf 1 MG capsule Generic drug: tacrolimus Take 1 mg by mouth 2 (two) times daily.   sodium bicarbonate 650 MG tablet Take 1 tablet (650 mg total) by mouth 3 (three) times daily.   vancomycin  IVPB Inject 500 mg into the vein every dialysis for up to 9 days. Indication:  Cellulitis Last Day of Therapy:  08/05/22 Method of administration: Per HD protocol after HD on Mon/Tue/Thu/Fri   VITAMIN D PO Take 1 tablet by mouth daily.               Home Infusion Instuctions  (  From admission, onward)           Start     Ordered   07/27/22 0000  Home infusion instructions       Question:  Instructions  Answer:  Flushing of vascular access device: 0.9% NaCl pre/post medication administration and prn patency; Heparin 100 u/ml, 5ml for implanted ports and Heparin 10u/ml, 5ml for all other central venous catheters.   07/27/22 1238            Follow-up Information     [facility]. Go on 07/28/2022.   Why: Transitional Care Unit- Monday/Tuesday/Thursday/Friday with 7:00 am chair time.  On Friday, please arrive at 6:30 am for paperwork prior to treatment. Contact information: [address] [place] [address] [phone]         PCP Follow up in 1 week(s).                 Allergies  Allergen Reactions   Oxycodone-Acetaminophen Itching, Nausea And Vomiting, Nausea Only, Rash and Other (See Comments)    Headaches, also    Piperacillin-Tazobactam In Dex     Other  Reaction(s): Other (See Comments)  Thrombocytopenia  Thrombocytopenia   Zosyn [Piperacillin Sod-Tazobactam So] Other (See Comments)    Thrombocytopenia   Hydromorphone Nausea And Vomiting, Rash and Other (See Comments)   Amlodipine Other (See Comments) and Swelling    Ankle swelling   Codeine     vomiting   Hydralazine     Headache   Zyvox [Linezolid] Other (See Comments)    Decreased platelet counts    Consultations: Neurology and ID   Procedures/Studies: VAS [place] LOWER EXTREMITY VENOUS (DVT)  Result Date: 07/25/2022  Lower Venous DVT Study Patient Name:  Christina Mack  Date of Exam:   07/25/2022 Medical Rec #: [id number]     Accession #:    [id number] Date of Birth: [birth year]    Patient Gender: F Patient Age:   29 years Exam Location:  [facility] Procedure:      VAS [place] LOWER EXTREMITY VENOUS (DVT) Referring Phys: Joen Laura --------------------------------------------------------------------------------  Indications: Left leg pain, erythema.  Comparison Study: No prior studies. Performing Technologist: Jean Rosenthal RDMS, RVT  Examination Guidelines: A complete evaluation includes B-mode imaging, spectral Doppler, color Doppler, and power Doppler as needed of all accessible portions of each vessel. Bilateral testing is considered an integral part of a complete examination. Limited examinations for reoccurring indications may be performed as noted. The reflux portion of the exam is performed with the patient in reverse Trendelenburg.  +-----+---------------+---------+-----------+----------+--------------+ RIGHTCompressibilityPhasicitySpontaneityPropertiesThrombus Aging +-----+---------------+---------+-----------+----------+--------------+ CFV  Full           Yes      Yes                                 +-----+---------------+---------+-----------+----------+--------------+   +---------+---------------+---------+-----------+----------+--------------+ LEFT      CompressibilityPhasicitySpontaneityPropertiesThrombus Aging +---------+---------------+---------+-----------+----------+--------------+ CFV      Full           Yes      Yes                                 +---------+---------------+---------+-----------+----------+--------------+ SFJ      Full                                                        +---------+---------------+---------+-----------+----------+--------------+  FV Prox  Full                                                        +---------+---------------+---------+-----------+----------+--------------+ FV Mid   Full                                                        +---------+---------------+---------+-----------+----------+--------------+ FV DistalFull                                                        +---------+---------------+---------+-----------+----------+--------------+ PFV      Full                                                        +---------+---------------+---------+-----------+----------+--------------+ POP      Full           Yes      Yes                                 +---------+---------------+---------+-----------+----------+--------------+ PTV      Full                                                        +---------+---------------+---------+-----------+----------+--------------+ PERO     Full                                                        +---------+---------------+---------+-----------+----------+--------------+     Summary: RIGHT: - No evidence of common femoral vein obstruction.  LEFT: - There is no evidence of deep vein thrombosis in the lower extremity.  - No cystic structure found in the popliteal fossa.  *See table(s) above for measurements and observations. Electronically signed by Coral Else MD on 07/25/2022 at 7:31:05 PM.    Final    VAS [place] UPPER EXT VEIN MAPPING (PRE-OP AVF)  Result Date: 07/25/2022 UPPER EXTREMITY VEIN MAPPING  Patient Name:  MAKEDA PEEKS  Date of Exam:   07/25/2022 Medical Rec #: [id number]     Accession #:    [id number] Date of Birth: 18-Jul-1965    Patient Gender: F Patient Age:   21 years Exam Location:  [facility] Procedure:      VAS [place] UPPER EXT VEIN MAPPING (PRE-OP AVF) Referring Phys: Vallery Sa --------------------------------------------------------------------------------  Indications: Pre-access. History: ESRD.  Comparison Study: No prior studies. Performing Technologist: Jean Rosenthal RDMS, RVT  Examination Guidelines: A complete evaluation includes B-mode imaging,  spectral Doppler, color Doppler, and power Doppler as needed of all accessible portions of each vessel. Bilateral testing is considered an integral part of a complete examination. Limited examinations for reoccurring indications may be performed as noted. +-----------------+-------------+----------+---------+ Right Cephalic   Diameter (cm)Depth (cm)Findings  +-----------------+-------------+----------+---------+ Antecubital fossa    0.35        0.50             +-----------------+-------------+----------+---------+ Prox forearm         0.27        0.48             +-----------------+-------------+----------+---------+ Mid forearm          0.32        0.27             +-----------------+-------------+----------+---------+ Dist forearm         0.31        0.25   branching +-----------------+-------------+----------+---------+ Wrist                0.27        0.13             +-----------------+-------------+----------+---------+ +-----------------+-------------+----------+---------+ Right Basilic    Diameter (cm)Depth (cm)Findings  +-----------------+-------------+----------+---------+ Mid upper arm        0.63                         +-----------------+-------------+----------+---------+ Dist upper arm       0.58                         +-----------------+-------------+----------+---------+  Antecubital fossa    0.46               branching +-----------------+-------------+----------+---------+ Prox forearm         0.35                         +-----------------+-------------+----------+---------+ Mid forearm          0.24               branching +-----------------+-------------+----------+---------+ Distal forearm       0.22                         +-----------------+-------------+----------+---------+ Wrist                0.20                         +-----------------+-------------+----------+---------+ +-----------------+-------------+----------+---------+ Left Cephalic    Diameter (cm)Depth (cm)Findings  +-----------------+-------------+----------+---------+ Antecubital fossa    0.72        0.86   branching +-----------------+-------------+----------+---------+ Prox forearm         0.42        0.48   branching +-----------------+-------------+----------+---------+ Mid forearm          0.40        0.36             +-----------------+-------------+----------+---------+ Dist forearm         0.39        0.17             +-----------------+-------------+----------+---------+ Wrist                0.42        0.38   branching +-----------------+-------------+----------+---------+ +-----------------+-------------+----------+---------+ Left Basilic     Diameter (cm)Depth (  cm)Findings  +-----------------+-------------+----------+---------+ Mid upper arm        0.62                         +-----------------+-------------+----------+---------+ Dist upper arm       0.56                         +-----------------+-------------+----------+---------+ Antecubital fossa    0.39               branching +-----------------+-------------+----------+---------+ Prox forearm         0.26               branching +-----------------+-------------+----------+---------+ Mid forearm          0.35                          +-----------------+-------------+----------+---------+ Distal forearm       0.37                         +-----------------+-------------+----------+---------+ Wrist                0.29                         +-----------------+-------------+----------+---------+ *See table(s) above for measurements and observations.  Diagnosing physician: Coral Else MD Electronically signed by Coral Else MD on 07/25/2022 at 7:28:52 PM.    Final    IR Fluoro Guide CV Line Right  Result Date: 07/24/2022 INDICATION: 57 year old female with a history of renal failure referred for tunneled hemodialysis catheter EXAM: TUNNELED CENTRAL VENOUS HEMODIALYSIS CATHETER PLACEMENT WITH ULTRASOUND AND FLUOROSCOPIC GUIDANCE MEDICATIONS: Vancomycin 1 gm IV . The antibiotic was given in an appropriate time interval prior to skin puncture. ANESTHESIA/SEDATION: Moderate (conscious) sedation was employed during this procedure. A total of Versed 2.0 mg and Fentanyl 100 mcg was administered intravenously by the radiology nurse. Total intra-service moderate Sedation Time: 16 minutes. The patient's level of consciousness and vital signs were monitored continuously by radiology nursing throughout the procedure under my direct supervision. FLUOROSCOPY: Radiation Exposure Index (as provided by the fluoroscopic device): 1 mGy Kerma COMPLICATIONS: None PROCEDURE: Informed written consent was obtained from the patient after a discussion of the risks, benefits, and alternatives to treatment. Questions regarding the procedure were encouraged and answered. The right neck and chest were prepped with chlorhexidine in a sterile fashion, and a sterile drape was applied covering the operative field. Maximum barrier sterile technique with sterile gowns and gloves were used for the procedure. A timeout was performed prior to the initiation of the procedure. Ultrasound survey was performed. The right internal jugular vein was confirmed to be patent,  with images stored and sent to PACS. Micropuncture kit was utilized to access the right internal jugular vein under direct, real-time ultrasound guidance after the overlying soft tissues were anesthetized with 1% lidocaine with epinephrine. Stab incision was made with 11 blade scalpel. Microwire was passed centrally. The microwire was then marked to measure appropriate internal catheter length. External tunneled length was estimated. A total tip to cuff length of 19 cm was selected. 035 guidewire was advanced to the level of the IVC. Skin and subcutaneous tissues of chest wall below the clavicle were generously infiltrated with 1% lidocaine for local anesthesia. A small stab incision was made with 11 blade scalpel. The selected hemodialysis catheter  was tunneled in a retrograde fashion from the anterior chest wall to the venotomy incision. Serial dilation was performed and then a peel-away sheath was placed. The catheter was then placed through the peel-away sheath with tips ultimately positioned within the superior aspect of the right atrium. Final catheter positioning was confirmed and documented with a spot radiographic image. The catheter aspirates and flushes normally. The catheter was flushed with appropriate volume heparin dwells. The catheter exit site was secured with a 0-Prolene retention suture. Gel-Foam slurry was infused into the soft tissue tract. Because of the uremia and some mild oozing at the exit site on the chest wall additional suture was placed with 0 Prolene. The venotomy incision was closed Derma bond and sterile dressing. Dressings were applied at the chest wall. Patient tolerated the procedure well and remained hemodynamically stable throughout. No complications were encountered and no significant blood loss encountered. IMPRESSION: Status post image guided right IJ tunneled hemodialysis catheter. Signed, Yvone Neu. Miachel Roux, RPVI [facility]  [facility] Radiology Electronically Signed   By: Gilmer Mor D.O.   On: 07/24/2022 10:19   IR US Guide Vasc Access Right  Result Date: 07/24/2022 INDICATION: 57 year old female with a history of renal failure referred for tunneled hemodialysis catheter EXAM: TUNNELED CENTRAL VENOUS HEMODIALYSIS CATHETER PLACEMENT WITH ULTRASOUND AND FLUOROSCOPIC GUIDANCE MEDICATIONS: Vancomycin 1 gm IV . The antibiotic was given in an appropriate time interval prior to skin puncture. ANESTHESIA/SEDATION: Moderate (conscious) sedation was employed during this procedure. A total of Versed 2.0 mg and Fentanyl 100 mcg was administered intravenously by the radiology nurse. Total intra-service moderate Sedation Time: 16 minutes. The patient's level of consciousness and vital signs were monitored continuously by radiology nursing throughout the procedure under my direct supervision. FLUOROSCOPY: Radiation Exposure Index (as provided by the fluoroscopic device): 1 mGy Kerma COMPLICATIONS: None PROCEDURE: Informed written consent was obtained from the patient after a discussion of the risks, benefits, and alternatives to treatment. Questions regarding the procedure were encouraged and answered. The right neck and chest were prepped with chlorhexidine in a sterile fashion, and a sterile drape was applied covering the operative field. Maximum barrier sterile technique with sterile gowns and gloves were used for the procedure. A timeout was performed prior to the initiation of the procedure. Ultrasound survey was performed. The right internal jugular vein was confirmed to be patent, with images stored and sent to PACS. Micropuncture kit was utilized to access the right internal jugular vein under direct, real-time ultrasound guidance after the overlying soft tissues were anesthetized with 1% lidocaine with epinephrine. Stab incision was made with 11 blade scalpel. Microwire was passed centrally. The microwire was then marked to measure  appropriate internal catheter length. External tunneled length was estimated. A total tip to cuff length of 19 cm was selected. 035 guidewire was advanced to the level of the IVC. Skin and subcutaneous tissues of chest wall below the clavicle were generously infiltrated with 1% lidocaine for local anesthesia. A small stab incision was made with 11 blade scalpel. The selected hemodialysis catheter was tunneled in a retrograde fashion from the anterior chest wall to the venotomy incision. Serial dilation was performed and then a peel-away sheath was placed. The catheter was then placed through the peel-away sheath with tips ultimately positioned within the superior aspect of the right atrium. Final catheter positioning was confirmed and documented with a spot radiographic image. The catheter aspirates and flushes normally. The catheter was flushed with appropriate volume heparin  dwells. The catheter exit site was secured with a 0-Prolene retention suture. Gel-Foam slurry was infused into the soft tissue tract. Because of the uremia and some mild oozing at the exit site on the chest wall additional suture was placed with 0 Prolene. The venotomy incision was closed Derma bond and sterile dressing. Dressings were applied at the chest wall. Patient tolerated the procedure well and remained hemodynamically stable throughout. No complications were encountered and no significant blood loss encountered. IMPRESSION: Status post image guided right IJ tunneled hemodialysis catheter. Signed, Yvone Neu. Miachel Roux, RPVI [facility] Radiology Electronically Signed   By: Gilmer Mor D.O.   On: 07/24/2022 10:19   MR PELVIS WO CONTRAST  Result Date: 07/23/2022 CLINICAL DATA:  Sepsis.  Osteomyelitis. EXAM: MRI PELVIS WITHOUT CONTRAST TECHNIQUE: Multiplanar multisequence MR imaging of the pelvis was performed. No intravenous contrast was administered. COMPARISON:  CT scan  03/20/2022 FINDINGS: Urinary Tract: The bladder is unremarkable. No bladder mass or calculi. Bowel: The rectum, sigmoid colon and visualized pelvic bowel loops are unremarkable. Vascular/Lymphatic: The major vascular structures are unremarkable. No pelvic or inguinal adenopathy. Reproductive:  Surgically absent. 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 Musculoskeletal: Chronic osteomyelitis involving the right inferior pubic ramus which demonstrates destructive bony changes. There is also stable severe degenerative changes at the pubic symphysis which could be related to chronic septic arthritis there is a persistent air containing fistulous track possibly involving the anterior wall the rectum or anus and extending into the region of the inferior pubic ramus. No discrete fluid collection/abscess is identified. There is a small amount of free pelvic fluid of uncertain significance. Right hip prosthesis no obvious complicating features. Diffuse cellulitis and myofasciitis involving hip and pelvic musculature most notably the adductor muscles bilaterally. No definite findings for pyomyositis. IMPRESSION: 1. Chronic osteomyelitis involving the right inferior pubic ramus which demonstrates destructive bony changes. 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 2. Persistent air containing fistulous track possibly involving the anterior wall the rectum or anus and extending into the region of the inferior pubic ramus. No discrete fluid collection/abscess is identified. 3. Small amount of free pelvic fluid of uncertain significance. 4. Diffuse cellulitis and myofasciitis involving hip and pelvic musculature most notably the adductor muscles bilaterally. No definite findings for pyomyositis. Electronically Signed   By: Rudie Meyer M.D.   On: 07/23/2022 16:06    US Renal Transplant w/Doppler  Result Date: 07/22/2022 CLINICAL DATA:  Renal transplant, rejection EXAM: ULTRASOUND OF RENAL TRANSPLANT WITH RENAL DOPPLER ULTRASOUND TECHNIQUE: Ultrasound examination of the renal transplant was performed with gray-scale, color and duplex doppler evaluation. COMPARISON:  None Available. FINDINGS: Transplant kidney location: Right lower quadrant Transplant Kidney: Renal measurements: 12.0 x 4.8 x 5.0 cm = volume: . Renal cortical thickness is preserved, however, cortical echogenicity is diffusely, mildly increased. No intrarenal mass or hydronephrosis. No peri-transplant fluid collection seen. Color flow in the main renal artery:  Present Color flow in the main renal vein:  Present Duplex Doppler Evaluation: Main Renal Artery Velocity: 168 cm/sec Main Renal Artery Resistive Index: 0.8 Venous waveform in main renal vein:  Present Intrarenal resistive index in upper pole:  0.64 (normal 0.6-0.8; equivocal 0.8-0.9; abnormal >= 0.9) Intrarenal resistive index in lower pole: 0.69 (normal 0.6-0.8; equivocal 0.8-0.9; abnormal >= 0.9) Bladder: Normal for degree of bladder distention. Other findings:  None. IMPRESSION: 1. Mildly increased cortical echogenicity of the right lower quadrant transplant kidney, a nonspecific finding. This can be seen in the setting of medical renal disease. 2. Resolution of previously noted hydronephrosis. Electronically Signed   By: Helyn Numbers M.D.   On: 07/22/2022 00:15   DG Chest Port 1 View  Result Date: 07/21/2022 CLINICAL DATA:  Questionable sepsis - evaluate for abnormality EXAM: PORTABLE CHEST 1 VIEW COMPARISON:  03/19/2022 FINDINGS: Heart and mediastinal contours are within normal limits. No focal opacities or effusions. No acute bony abnormality. IMPRESSION: No active disease. Electronically Signed   By: Charlett Nose M.D.   On: 07/21/2022 22:29     Discharge Exam: Vitals:   07/27/22 0545 07/27/22 0800  BP: (!) 163/93 (!) 172/100   Pulse: 93 96  Resp: 16 16  Temp: 99 F (37.2 C) 98.1 F (36.7 C)  SpO2: 98% 100%   Vitals:   07/26/22 1634 07/26/22 2045 07/27/22 0545 07/27/22 0800  BP: (!) 155/99 (!) 152/93 (!) 163/93 (!) 172/100  Pulse: 89 88 93 96  Resp: 18 17 16 16   Temp: 98.3 F (36.8 C) 98.9 F (37.2 C) 99 F (37.2 C) 98.1 F (36.7 C)  TempSrc: Oral Oral Oral Oral  SpO2: 97% 97% 98% 100%  Weight:  57.6 kg    Height:        General: Pt is alert, awake, not in acute distress Cardiovascular: RRR, S1/S2 +, no rubs, no gallops Respiratory: CTA bilaterally, no wheezing, no rhonchi Abdominal: Soft, NT, ND, bowel sounds + Extremities: no edema, no cyanosis    The results of significant diagnostics from this hospitalization (including imaging, microbiology, ancillary and laboratory) are listed below for reference.     Microbiology: Recent Results (from the past 240 hour(s))  Blood Culture (routine x 2)     Status: None   Collection Time: 07/21/22  9:57 PM   Specimen: BLOOD RIGHT ARM  Result Value Ref Range Status   Specimen Description BLOOD RIGHT ARM  Final   Special Requests   Final    BOTTLES DRAWN AEROBIC AND ANAEROBIC Blood Culture adequate volume   Culture   Final    NO GROWTH 5 DAYS Performed at [facility], [address], [place], [place] [address]    Report Status 07/26/2022 FINAL  Final  Blood Culture (routine x 2)     Status: None   Collection Time: 07/21/22  9:57 PM   Specimen: BLOOD  Result Value Ref Range Status   Specimen Description BLOOD BLOOD LEFT ARM  Final   Special Requests   Final    BOTTLES DRAWN AEROBIC ONLY Blood Culture adequate volume   Culture   Final    NO GROWTH 5 DAYS Performed at [facility], [address], [place], [place] [address]    Report Status 07/26/2022 FINAL  Final  Resp panel by RT-PCR (RSV, Flu A&B, Covid) Anterior Nasal Swab     Status: None   Collection Time: 07/21/22 10:22 PM   Specimen: Anterior Nasal Swab  Result Value Ref  Range Status   SARS Coronavirus 2 by RT PCR NEGATIVE NEGATIVE Final   Influenza A by PCR NEGATIVE NEGATIVE Final   Influenza B by PCR NEGATIVE NEGATIVE Final    Comment: (NOTE) The Xpert Xpress SARS-CoV-2/FLU/RSV plus assay is intended as an aid in the diagnosis of influenza from Nasopharyngeal swab specimens and should not be used as a sole basis for treatment. Nasal washings and aspirates  are unacceptable for Xpert Xpress SARS-CoV-2/FLU/RSV testing.  Fact Sheet for Patients: BloggerCourse.com  Fact Sheet for Healthcare Providers: SeriousBroker.it  This test is not yet approved or cleared by the [place] FDA and has been authorized for detection and/or diagnosis of SARS-CoV-2 by FDA under an Emergency Use Authorization (EUA). This EUA will remain in effect (meaning this test can be used) for the duration of the COVID-19 declaration under Section 564(b)(1) of the Act, 21 U.S.C. section 360bbb-3(b)(1), unless the authorization is terminated or revoked.     Resp Syncytial Virus by PCR NEGATIVE NEGATIVE Final    Comment: (NOTE) Fact Sheet for Patients: BloggerCourse.com  Fact Sheet for Healthcare Providers: SeriousBroker.it  This test is not yet approved or cleared by the [place] FDA and has been authorized for detection and/or diagnosis of SARS-CoV-2 by FDA under an Emergency Use Authorization (EUA). This EUA will remain in effect (meaning this test can be used) for the duration of the COVID-19 declaration under Section 564(b)(1) of the Act, 21 U.S.C. section 360bbb-3(b)(1), unless the authorization is terminated or revoked.  Performed at [facility], [address] [place], [place] [address]   Urine Culture (for pregnant, neutropenic or urologic patients or patients with an indwelling urinary catheter)     Status: Abnormal   Collection Time: 07/22/22  6:59  PM   Specimen: Urine, Clean Catch  Result Value Ref Range Status   Specimen Description URINE, CLEAN CATCH  Final   Special Requests   Final    NONE Performed at [facility], [address], [place], [place] [address]    Culture 10,000 COLONIES/mL PSEUDOMONAS AERUGINOSA (A)  Final   Report Status 07/25/2022 FINAL  Final   Organism ID, Bacteria PSEUDOMONAS AERUGINOSA (A)  Final      Susceptibility   Pseudomonas aeruginosa - MIC*    CEFTAZIDIME 4 SENSITIVE Sensitive     CIPROFLOXACIN INTERMEDIATE Intermediate     GENTAMICIN <=1 SENSITIVE Sensitive     IMIPENEM 2 SENSITIVE Sensitive     * 10,000 COLONIES/mL PSEUDOMONAS AERUGINOSA     Labs: BNP (last 3 results) No results for input(s): "BNP" in the last 8760 hours. Basic Metabolic Panel: Recent Labs  Lab 07/22/22 0239 07/23/22 0202 07/23/22 1028 07/24/22 0301 07/25/22 0319 07/26/22 0118  NA 130* 132*  --  134* 134* 130*  K 2.9* 2.6* 2.7* 2.9* 3.7 3.5  CL 104 102  --  103 99 96*  CO2 <7* 8*  --  13* 20* 22  GLUCOSE 139* 126*  --  131* 94 123*  BUN 140* 140*  --  136* 90* 52*  CREATININE 11.74* 11.74*  --  11.15* 8.05* 5.49*  CALCIUM 5.7* 6.4*  --  6.0* 7.0* 7.0*  MG 0.9*  --  1.3* 1.9  --   --   PHOS 9.7* 9.4*  --   --  6.3* 5.6*   Liver Function Tests: Recent Labs  Lab 07/21/22 2129 07/22/22 0239 07/23/22 0202 07/25/22 0319 07/26/22 0118  AST 8* 6*  --   --   --   ALT 6 8  --   --   --   ALKPHOS 71 56  --   --   --   BILITOT 0.5 0.8  --   --   --   PROT 7.7 6.5  --   --   --   ALBUMIN 2.7* 2.3* 2.1* 2.1* 2.1*   No results for input(s): "LIPASE", "AMYLASE" in the last  168 hours. No results for input(s): "AMMONIA" in the last 168 hours. CBC: Recent Labs  Lab 07/21/22 2129 07/22/22 0239 07/23/22 0202 07/23/22 1028 07/24/22 0301 07/25/22 0319 07/26/22 0118  WBC 19.9* 20.0* 11.9*  --  9.9 8.0 7.8  NEUTROABS 16.4* 16.2*  --   --   --   --   --   HGB 8.1* 7.0* 6.5* 7.5* 7.2* 7.5* 7.6*  HCT 25.3*  21.7* 19.6* 21.6* 22.4* 22.6* 23.2*  MCV 96.2 95.2 92.9  --  93.7 91.1 93.2  PLT 142* 122* 124*  --  115* 113* 117*   Cardiac Enzymes: No results for input(s): "CKTOTAL", "CKMB", "CKMBINDEX", "TROPONINI" in the last 168 hours. BNP: Invalid input(s): "POCBNP" CBG: Recent Labs  Lab 07/24/22 1118  GLUCAP 131*   D-Dimer No results for input(s): "DDIMER" in the last 72 hours. Hgb A1c No results for input(s): "HGBA1C" in the last 72 hours. Lipid Profile No results for input(s): "CHOL", "HDL", "LDLCALC", "TRIG", "CHOLHDL", "LDLDIRECT" in the last 72 hours. Thyroid function studies No results for input(s): "TSH", "T4TOTAL", "T3FREE", "THYROIDAB" in the last 72 hours.  Invalid input(s): "FREET3" Anemia work up No results for input(s): "VITAMINB12", "FOLATE", "FERRITIN", "TIBC", "IRON", "RETICCTPCT" in the last 72 hours. Urinalysis    Component Value Date/Time   COLORURINE STRAW (A) 07/22/2022 1802   APPEARANCEUR CLEAR 07/22/2022 1802   LABSPEC 1.008 07/22/2022 1802   PHURINE 5.0 07/22/2022 1802   GLUCOSEU 50 (A) 07/22/2022 1802   HGBUR LARGE (A) 07/22/2022 1802   BILIRUBINUR NEGATIVE 07/22/2022 1802   BILIRUBINUR negative 11/02/2020 1037   KETONESUR NEGATIVE 07/22/2022 1802   PROTEINUR 100 (A) 07/22/2022 1802   UROBILINOGEN 0.2 11/02/2020 1037   NITRITE NEGATIVE 07/22/2022 1802   LEUKOCYTESUR TRACE (A) 07/22/2022 1802   Sepsis Labs Recent Labs  Lab 07/23/22 0202 07/24/22 0301 07/25/22 0319 07/26/22 0118  WBC 11.9* 9.9 8.0 7.8   Microbiology Recent Results (from the past 240 hour(s))  Blood Culture (routine x 2)     Status: None   Collection Time: 07/21/22  9:57 PM   Specimen: BLOOD RIGHT ARM  Result Value Ref Range Status   Specimen Description BLOOD RIGHT ARM  Final   Special Requests   Final    BOTTLES DRAWN AEROBIC AND ANAEROBIC Blood Culture adequate volume   Culture   Final    NO GROWTH 5 DAYS Performed at [facility], [address]  [place], [place] [address]    Report Status 07/26/2022 FINAL  Final  Blood Culture (routine x 2)     Status: None   Collection Time: 07/21/22  9:57 PM   Specimen: BLOOD  Result Value Ref Range Status   Specimen Description BLOOD BLOOD LEFT ARM  Final   Special Requests   Final    BOTTLES DRAWN AEROBIC ONLY Blood Culture adequate volume   Culture   Final    NO GROWTH 5 DAYS Performed at [facility], [address], [place], [place] [address]    Report Status 07/26/2022 FINAL  Final  Resp panel by RT-PCR (RSV, Flu A&B, Covid) Anterior Nasal Swab     Status: None   Collection Time: 07/21/22 10:22 PM   Specimen: Anterior Nasal Swab  Result Value Ref Range Status   SARS Coronavirus 2 by RT PCR NEGATIVE NEGATIVE Final   Influenza A by PCR NEGATIVE NEGATIVE Final   Influenza B by PCR NEGATIVE NEGATIVE Final    Comment: (NOTE) The Xpert Xpress SARS-CoV-2/FLU/RSV plus  assay is intended as an aid in the diagnosis of influenza from Nasopharyngeal swab specimens and should not be used as a sole basis for treatment. Nasal washings and aspirates are unacceptable for Xpert Xpress SARS-CoV-2/FLU/RSV testing.  Fact Sheet for Patients: BloggerCourse.com  Fact Sheet for Healthcare Providers: SeriousBroker.it  This test is not yet approved or cleared by the [place] FDA and has been authorized for detection and/or diagnosis of SARS-CoV-2 by FDA under an Emergency Use Authorization (EUA). This EUA will remain in effect (meaning this test can be used) for the duration of the COVID-19 declaration under Section 564(b)(1) of the Act, 21 U.S.C. section 360bbb-3(b)(1), unless the authorization is terminated or revoked.     Resp Syncytial Virus by PCR NEGATIVE NEGATIVE Final    Comment: (NOTE) Fact Sheet for Patients: BloggerCourse.com  Fact Sheet for Healthcare  Providers: SeriousBroker.it  This test is not yet approved or cleared by the [place] FDA and has been authorized for detection and/or diagnosis of SARS-CoV-2 by FDA under an Emergency Use Authorization (EUA). This EUA will remain in effect (meaning this test can be used) for the duration of the COVID-19 declaration under Section 564(b)(1) of the Act, 21 U.S.C. section 360bbb-3(b)(1), unless the authorization is terminated or revoked.  Performed at [facility], [address] [place], [place] [address]   Urine Culture (for pregnant, neutropenic or urologic patients or patients with an indwelling urinary catheter)     Status: Abnormal   Collection Time: 07/22/22  6:59 PM   Specimen: Urine, Clean Catch  Result Value Ref Range Status   Specimen Description URINE, CLEAN CATCH  Final   Special Requests   Final    NONE Performed at [facility], [address], [place], [place] [address]    Culture 10,000 COLONIES/mL PSEUDOMONAS AERUGINOSA (A)  Final   Report Status 07/25/2022 FINAL  Final   Organism ID, Bacteria PSEUDOMONAS AERUGINOSA (A)  Final      Susceptibility   Pseudomonas aeruginosa - MIC*    CEFTAZIDIME 4 SENSITIVE Sensitive     CIPROFLOXACIN INTERMEDIATE Intermediate     GENTAMICIN <=1 SENSITIVE Sensitive     IMIPENEM 2 SENSITIVE Sensitive     * 10,000 COLONIES/mL PSEUDOMONAS AERUGINOSA     Time coordinating discharge: Over 30 minutes  SIGNED:   Hughie Closs, MD  [facility] 07/27/2022, 2:01 PM *Please note that this is a verbal dictation therefore any spelling or grammatical errors are due to the "Dragon Medical One" system interpretation. If 7PM-7AM, please contact night-coverage [email or web address]

## 2022-07-27 NOTE — Progress Notes (Signed)
Nutrition Education Note  RD consulted for Renal Education.   Pt working with PT at the time of assessment and is scheduled to be discharged home this afternoon. Pt met with renal navigator and went over the basics of the renal diet. Attached renal diet food pyramid to AVS for review at home. Pt will receive specialized nutrition education and advice weekly from the RD at her HD center based on labs and pt's nutrition status  Body mass index is 21.13 kg/m. Pt meets criteria for normal based on current BMI.  Current diet order is Renal with a 1.2L fluid restriction, patient is consuming approximately 75-100% of meals at this time. Labs and medications reviewed. No further nutrition interventions warranted at this time. RD contact information provided. If additional nutrition issues arise, please re-consult RD.  Greig Castilla, RD, LDN Clinical Dietitian RD pager # available in AMION  After hours/weekend pager # available in [facility]

## 2022-07-27 NOTE — Progress Notes (Addendum)
Pt has been accepted at [facility] at [facility] on MTThF schedule. Pt will need to arrive at 6:30 am for 7:00 am chair time. Pt can start tomorrow if d/c today. Met with pt at bedside to provide the above details and schedule letter. Pt agreeable to plan. Update provided to attending, nephrologist, RN, and RN CM. Arrangements added to AVS as well. Spoke to [place] at [facility] who confirms that pt can receive iv abx with HD at d/c with orders from renal PA/MD. Will contact renal PA for orders once pt's d/c date is confirmed. Clinic aware pt for possible d/c today and to start tomorrow.   Olivia Canter Renal Navigator [phone]  Addendum at 2:30 pm: Pt to d/c today. Renal PA aware of clinic's need for orders for HD and iv abx. Clinic aware pt to d/c today and start tomorrow. Clinic RN, Karen Kitchens, confirms TCU able to provide iv vanc and ceftazidime with HD.

## 2022-07-27 NOTE — Progress Notes (Addendum)
Physical Therapy Treatment Patient Details Name: Christina Mack MRN: [id number] DOB: [birth year] Today's Date: 07/27/2022   History of Present Illness 57 y/o F admitted to [facility] on 4/19 for weakness in setting of acute renal failure superimposed on CKD stage V. Plans for placement of temporary catheter and initiation of HD on 4/22. PMHx: renal transplant in 2014, CKD, HTN, anemia, osteomyelitis of the pubis    PT Comments    Pt greeted supine in bed and agreeable to session, however pt continues to be limited by LLE pain, needing increased time to initiate all mobility. Pt able to complete bed mobility at mod I level, requiring increased time seated EOB before initiating transfers to stand and gait. Pt able to transfer to stand with grossly min guard with and without AD. Pt agreeable to RW trial during gait for increased stability, encouraged pt to push up through Ues to un weight painful LLE with pt endorsing able to demo back and pt demonstrating increased ambulation tolerance with RW use. Continued education on importance of frequent mobilization and importance of time up OOB with pt verbalizing understanding. Pt continues to benefit from skilled PT services to progress toward functional mobility goals.    Recommendations for follow up therapy are one component of a multi-disciplinary discharge planning process, led by the attending physician.  Recommendations may be updated based on patient status, additional functional criteria and insurance authorization.  Follow Up Recommendations       Assistance Recommended at Discharge Intermittent Supervision/Assistance  Patient can return home with the following A little help with walking and/or transfers;Help with stairs or ramp for entrance   Equipment Recommendations  None recommended by PT    Recommendations for Other Services       Precautions / Restrictions Precautions Precautions: Fall Precaution Comments: low fall  risk Restrictions Weight Bearing Restrictions: No     Mobility  Bed Mobility Overal bed mobility: Modified Independent                  Transfers Overall transfer level: Needs assistance Equipment used: 1 person hand held assist, Rolling walker (2 wheels) Transfers: Sit to/from Stand, Bed to chair/wheelchair/BSC Sit to Stand: Min guard   Step pivot transfers: Modified independent (Device/Increase time)       General transfer comment: HHA transfers due to LLE pain requiring support, pt able to transfer BSC>EOB without physical assist    Ambulation/Gait Ambulation/Gait assistance: Min guard Gait Distance (Feet): 120 Feet Assistive device: Rolling walker (2 wheels) Gait Pattern/deviations: Step-through pattern, Decreased stride length, Antalgic Gait velocity: decreased     General Gait Details: mildly antalgic gait on LLE improving with use RW for support and to un weight painful LLE   Stairs             Wheelchair Mobility    Modified Rankin (Stroke Patients Only)       Balance Overall balance assessment: Needs assistance Sitting-balance support: No upper extremity supported, Feet supported Sitting balance-Leahy Scale: Good     Standing balance support: Single extremity supported, No upper extremity supported, During functional activity Standing balance-Leahy Scale: Fair Standing balance comment: single extemity support for dynamic balance, static standing without UE support                            Cognition Arousal/Alertness: Awake/alert Behavior During Therapy: Flat affect Overall Cognitive Status: Within Functional Limits for tasks assessed  General Comments: A&Ox4        Exercises      General Comments        Pertinent Vitals/Pain Pain Assessment Pain Assessment: 0-10 Pain Score: 7  Pain Location: LLE in dependent position Pain Descriptors / Indicators: Aching,  Discomfort, Throbbing Pain Intervention(s): Monitored during session, Limited activity within patient's tolerance    Home Living                          Prior Function            PT Goals (current goals can now be found in the care plan section) Acute Rehab PT Goals PT Goal Formulation: With patient Time For Goal Achievement: 08/06/22 Progress towards PT goals: Progressing toward goals    Frequency    Min 3X/week      PT Plan      Co-evaluation              AM-PAC PT "6 Clicks" Mobility   Outcome Measure  Help needed turning from your back to your side while in a flat bed without using bedrails?: None Help needed moving from lying on your back to sitting on the side of a flat bed without using bedrails?: None Help needed moving to and from a bed to a chair (including a wheelchair)?: A Little Help needed standing up from a chair using your arms (e.g., wheelchair or bedside chair)?: A Little Help needed to walk in hospital room?: A Lot Help needed climbing 3-5 steps with a railing? : A Lot 6 Click Score: 18    End of Session Equipment Utilized During Treatment: Gait belt Activity Tolerance: Patient limited by pain Patient left: in bed;with call bell/phone within reach (seated EOB) Nurse Communication: Mobility status PT Visit Diagnosis: Difficulty in walking, not elsewhere classified (R26.2);Pain Pain - Right/Left: Left Pain - part of body: Leg     Time: 4098-1191 PT Time Calculation (min) (ACUTE ONLY): 26 min  Charges:  $Gait Training: 8-22 mins $Therapeutic Activity: 8-22 mins                     Wynter Isaacs R. PTA Acute Rehabilitation Services Office: [phone]    Catalina Antigua 07/27/2022, 12:56 PM

## 2022-07-27 NOTE — Progress Notes (Addendum)
PHARMACY CONSULT NOTE FOR:  OUTPATIENT  PARENTERAL ANTIBIOTIC THERAPY (OPAT)  Informational only as the patient will receive antibiotics outpatient with HD at her HD center  Indication: Cellulitis Regimen: Vancomycin 500 mg/HD-M/Tu/Th/F + Ceftazidime 1g/HD-M/Tu/Th/F End date: 08/05/22  IV antibiotic discharge orders are pended. To discharging provider:  please sign these orders via discharge navigator,  Select New Orders & click on the button choice - Manage This Unsigned Work.     Thank you for allowing pharmacy to be a part of this patient's care.  Georgina Pillion, PharmD, BCPS Infectious Diseases Clinical Pharmacist 07/27/2022 8:37 AM   **Pharmacist phone directory can now be found on [email or web address] ([email or web address]).  Listed under [facility].

## 2022-07-27 NOTE — Progress Notes (Signed)
Brief ID Note:   Carollyn is being discharged this afternoon.   D/W nephrology and primary team. She will continue IV antibiotics Vanc 500 mg/HD + Ceftazidime 1g/HD thru 08/05/22.  To follow up on - LLE cellulitis and nodular rash. Deferred biopsy for now per her wishes.   FU appt with me on 08/08/22 arranged in the afternoon,    Rexene Alberts, MSN, NP-C [facility]  [email or web address] Pager: [phone] Office: [phone] [facility] Main Line: [phone] *Secure Chat Communication Welcome

## 2022-07-27 NOTE — Plan of Care (Signed)
  Problem: Clinical Measurements: Goal: Complications related to the disease process or treatment will be avoided or minimized Outcome: Adequate for Discharge Goal: Dialysis access will remain free of complications Outcome: Adequate for Discharge   Problem: Acute Rehab OT Goals (only OT should resolve) Goal: OT Additional ADL Goal #1 Outcome: Adequate for Discharge Goal: OT Additional ADL Goal #2 Outcome: Adequate for Discharge   Problem: Acute Rehab PT Goals(only PT should resolve) Goal: Patient Will Transfer Sit To/From Stand Outcome: Adequate for Discharge Goal: Pt Will Transfer Bed To Chair/Chair To Bed Outcome: Adequate for Discharge Goal: Pt Will Ambulate Outcome: Adequate for Discharge Goal: Pt Will Go Up/Down Stairs Outcome: Adequate for Discharge   Problem: Education: Goal: Knowledge of disease and its progression will improve Outcome: Adequate for Discharge Goal: Individualized Educational Video(s) Outcome: Adequate for Discharge   Problem: Fluid Volume: Goal: Compliance with measures to maintain balanced fluid volume will improve Outcome: Adequate for Discharge   Problem: Health Behavior/Discharge Planning: Goal: Ability to manage health-related needs will improve Outcome: Adequate for Discharge   Problem: Nutritional: Goal: Ability to make healthy dietary choices will improve Outcome: Adequate for Discharge   Problem: Clinical Measurements: Goal: Complications related to the disease process, condition or treatment will be avoided or minimized Outcome: Adequate for Discharge

## 2022-07-28 ENCOUNTER — Emergency Department (HOSPITAL_COMMUNITY): Payer: Medicare Other

## 2022-07-28 ENCOUNTER — Emergency Department (HOSPITAL_COMMUNITY)
Admission: EM | Admit: 2022-07-28 | Discharge: 2022-07-28 | Disposition: A | Discharge status: Home / Self Care | Payer: Medicare Other | Attending: Emergency Medicine | Admitting: Emergency Medicine

## 2022-07-28 ENCOUNTER — Encounter (HOSPITAL_COMMUNITY): Payer: Self-pay

## 2022-07-28 ENCOUNTER — Other Ambulatory Visit: Payer: Self-pay

## 2022-07-28 DIAGNOSIS — R4781 Slurred speech: Secondary | ICD-10-CM | POA: Insufficient documentation

## 2022-07-28 DIAGNOSIS — D649 Anemia, unspecified: Secondary | ICD-10-CM | POA: Diagnosis not present

## 2022-07-28 DIAGNOSIS — E876 Hypokalemia: Secondary | ICD-10-CM | POA: Insufficient documentation

## 2022-07-28 DIAGNOSIS — D696 Thrombocytopenia, unspecified: Secondary | ICD-10-CM | POA: Insufficient documentation

## 2022-07-28 DIAGNOSIS — R471 Dysarthria and anarthria: Secondary | ICD-10-CM | POA: Diagnosis present

## 2022-07-28 DIAGNOSIS — R464 Slowness and poor responsiveness: Secondary | ICD-10-CM | POA: Diagnosis not present

## 2022-07-28 DIAGNOSIS — R479 Unspecified speech disturbances: Secondary | ICD-10-CM | POA: Diagnosis not present

## 2022-07-28 DIAGNOSIS — R7309 Other abnormal glucose: Secondary | ICD-10-CM | POA: Insufficient documentation

## 2022-07-28 LAB — COMPREHENSIVE METABOLIC PANEL
ALT: 8 U/L (ref 0–44)
AST: 9 U/L — ABNORMAL LOW (ref 15–41)
Albumin: 2.2 g/dL — ABNORMAL LOW (ref 3.5–5.0)
Alkaline Phosphatase: 67 U/L (ref 38–126)
Anion gap: 19 — ABNORMAL HIGH (ref 5–15)
BUN: 70 mg/dL — ABNORMAL HIGH (ref 6–20)
CO2: 20 mmol/L — ABNORMAL LOW (ref 22–32)
Calcium: 8.3 mg/dL — ABNORMAL LOW (ref 8.9–10.3)
Chloride: 98 mmol/L (ref 98–111)
Creatinine, Ser: 8.01 mg/dL — ABNORMAL HIGH (ref 0.44–1.00)
GFR, Estimated: 5 mL/min — ABNORMAL LOW (ref >60)
Glucose, Bld: 169 mg/dL — ABNORMAL HIGH (ref 70–99)
Potassium: 3.2 mmol/L — ABNORMAL LOW (ref 3.5–5.1)
Sodium: 137 mmol/L (ref 135–145)
Total Bilirubin: 0.8 mg/dL (ref 0.3–1.2)
Total Protein: 7 g/dL (ref 6.5–8.1)

## 2022-07-28 LAB — APTT: aPTT: 38 seconds — ABNORMAL HIGH (ref 24–36)

## 2022-07-28 LAB — PROTIME-INR
INR: 1.3 — ABNORMAL HIGH (ref 0.8–1.2)
Prothrombin Time: 16.1 seconds — ABNORMAL HIGH (ref 11.4–15.2)

## 2022-07-28 LAB — CBC
HCT: 24.5 % — ABNORMAL LOW (ref 36.0–46.0)
Hemoglobin: 7.4 g/dL — ABNORMAL LOW (ref 12.0–15.0)
MCH: 29.8 pg (ref 26.0–34.0)
MCHC: 30.2 g/dL (ref 30.0–36.0)
MCV: 98.8 fL (ref 80.0–100.0)
Platelets: 127 10*3/uL — ABNORMAL LOW (ref 150–400)
RBC: 2.48 MIL/uL — ABNORMAL LOW (ref 3.87–5.11)
RDW: 15.8 % — ABNORMAL HIGH (ref 11.5–15.5)
WBC: 9.7 10*3/uL (ref 4.0–10.5)
nRBC: 0 % (ref 0.0–0.2)

## 2022-07-28 LAB — I-STAT CHEM 8, ED
BUN: 74 mg/dL — ABNORMAL HIGH (ref 6–20)
Calcium, Ion: 1.05 mmol/L — ABNORMAL LOW (ref 1.15–1.40)
Chloride: 101 mmol/L (ref 98–111)
Creatinine, Ser: 9.4 mg/dL — ABNORMAL HIGH (ref 0.44–1.00)
Glucose, Bld: 168 mg/dL — ABNORMAL HIGH (ref 70–99)
HCT: 23 % — ABNORMAL LOW (ref 36.0–46.0)
Hemoglobin: 7.8 g/dL — ABNORMAL LOW (ref 12.0–15.0)
Potassium: 3.2 mmol/L — ABNORMAL LOW (ref 3.5–5.1)
Sodium: 137 mmol/L (ref 135–145)
TCO2: 20 mmol/L — ABNORMAL LOW (ref 22–32)

## 2022-07-28 LAB — DIFFERENTIAL
Abs Immature Granulocytes: 0.06 10*3/uL (ref 0.00–0.07)
Basophils Absolute: 0 10*3/uL (ref 0.0–0.1)
Basophils Relative: 0 %
Eosinophils Absolute: 0.3 10*3/uL (ref 0.0–0.5)
Eosinophils Relative: 3 %
Immature Granulocytes: 1 %
Lymphocytes Relative: 26 %
Lymphs Abs: 2.5 10*3/uL (ref 0.7–4.0)
Monocytes Absolute: 0.9 10*3/uL (ref 0.1–1.0)
Monocytes Relative: 9 %
Neutro Abs: 5.9 10*3/uL (ref 1.7–7.7)
Neutrophils Relative %: 61 %

## 2022-07-28 LAB — AMMONIA: Ammonia: 19 umol/L (ref 9–35)

## 2022-07-28 LAB — ETHANOL: Alcohol, Ethyl (B): <10 mg/dL (ref <10)

## 2022-07-28 LAB — CBG MONITORING, ED: Glucose-Capillary: 162 mg/dL — ABNORMAL HIGH (ref 70–99)

## 2022-07-28 NOTE — Progress Notes (Signed)
Case discussed with nephrologist. Spoke to [place], RN at [facility] who confirmed that pt can return to clinic from ED and staff will be able to provide pt's HD treatment this afternoon. Bobbie aware pt to return for treatment. Nephrologist provided info to ED provider.   Olivia Canter Renal Navigator [phone]

## 2022-07-28 NOTE — ED Triage Notes (Signed)
Pt came in via POV d/t stating yesterday morning she began moving slower & having trouble finding words to say. A/Ox4 in triage & denies pain while in triage. Pt denies Hx of stroke but worried since she was just d/c from hospital yesterday for kidney failure (per pt).

## 2022-07-28 NOTE — ED Provider Notes (Signed)
[place] EMERGENCY DEPARTMENT AT [facility] Provider Note   CSN: [id number] Arrival date & time: 07/28/22  0710     History  Chief Complaint  Patient presents with   Moving Slower   Trouble finding words    Christina Mack is a 57 y.o. female.  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  She reports that she began having difficulty speaking yesterday morning, prior to discharge, and that the difficulty continued since yesterday throughout the day until today.  Reports she feels a clumsiness on her left side.  Reports she is not having difficulty finding words, however is having difficulty getting the words out.  Denies vision changes, focal numbness or weakness, drooping of the face, dizziness.  Reports that she does feel less coordinated on the left side.  Reports it has been waxing and waning since yesterday. No discrete episodes.     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(HCC)     Home Medications Prior to Admission medications   Medication Sig Start Date End Date Taking? Authorizing Provider  carvedilol (COREG) 12.5 MG tablet Take 2 tablets (25 mg total) by mouth 2 (two) times daily with a meal. 05/29/22  Yes Vann, Jessica U, DO  cefTAZidime (FORTAZ) IVPB Inject 1 g into the vein every dialysis for up to 9 days. Indication:  Cellulitis Last Day of Therapy:  08/05/22 Method of administration: Per HD protocol after HD on Mon/Tue/Thu/Fri 07/27/22 08/05/22 Yes Dixon,  Gomez Cleverly, NP  isosorbide mononitrate (IMDUR) 30 MG 24 hr tablet Take 1 tablet (30 mg total) by mouth daily. 07/27/22 08/26/22 Yes Pahwani, Daleen Bo, MD  lisinopril (ZESTRIL) 40 MG tablet Take 1 tablet (40 mg total) by mouth daily. 07/27/22 08/26/22 Yes Pahwani, Daleen Bo, MD  pantoprazole (PROTONIX) 40 MG tablet TAKE 1 TABLET (40 MG TOTAL) BY MOUTH 2 (TWO) TIMES DAILY BEFORE A MEAL. Patient taking differently: Take 40 mg by mouth 2 (two) times daily before a meal. 05/17/20 07/28/22 Yes Love, Evlyn Kanner, PA-C  predniSONE (DELTASONE) 5 MG tablet Take 5 mg by mouth daily with breakfast.   Yes [provider]  sodium bicarbonate 650 MG tablet Take 1 tablet (650 mg total) by mouth 3 (three) times daily. 03/25/22  Yes Noralee Stain, DO  tacrolimus (PROGRAF) 1 MG capsule Take 1 mg by mouth 2 (two) times daily. 04/12/22  Yes [provider]  vancomycin IVPB Inject 500 mg into the vein every dialysis for up to 9 days. Indication:  Cellulitis Last Day of Therapy:  08/05/22 Method of administration: Per HD protocol after HD on Mon/Tue/Thu/Fri 07/27/22 08/05/22 Yes Dixon, Gomez Cleverly, NP  VITAMIN D PO Take 1 tablet by mouth daily. Patient not taking: Reported on 07/28/2022    [provider]      Allergies    Oxycodone-acetaminophen, Piperacillin-tazobactam in dex, Zosyn [piperacillin sod-tazobactam so], Hydromorphone, Amlodipine, Codeine, Hydralazine, and Zyvox [linezolid]    Review of Systems   Review of Systems  Physical Exam Updated  Vital Signs BP (!) 141/90   Pulse 98   Temp 98.7 F (37.1 C) (Oral)   Resp 20   SpO2 100%  Physical Exam Vitals and nursing note reviewed.  Constitutional:      General: She is not in acute distress.    Appearance: She is well-developed. She is not diaphoretic.  HENT:     Head: Normocephalic and atraumatic.  Eyes:     Conjunctiva/sclera: Conjunctivae normal.  Cardiovascular:     Rate and Rhythm: Normal rate and regular rhythm.     Heart sounds:  Normal heart sounds. No murmur heard.    No friction rub. No gallop.  Pulmonary:     Effort: Pulmonary effort is normal. No respiratory distress.     Breath sounds: Normal breath sounds. No wheezing or rales.  Abdominal:     General: There is no distension.     Palpations: Abdomen is soft.     Tenderness: There is no abdominal tenderness. There is no guarding.  Musculoskeletal:        General: No tenderness.     Cervical back: Normal range of motion.  Skin:    General: Skin is warm and dry.     Findings: No erythema or rash.  Neurological:     Mental Status: She is alert and oriented to person, place, and time.     Cranial Nerves: Cranial nerve deficit (tongue deviation to right) present.     Sensory: No sensory deficit.     Motor: No weakness.     Coordination: Coordination normal.     Comments: dysarthria     ED Results / Procedures / Treatments   Labs (all labs ordered are listed, but only abnormal results are displayed) Labs Reviewed  PROTIME-INR - Abnormal; Notable for the following components:      Result Value   Prothrombin Time 16.1 (*)    INR 1.3 (*)    All other components within normal limits  APTT - Abnormal; Notable for the following components:   aPTT 38 (*)    All other components within normal limits  CBC - Abnormal; Notable for the following components:   RBC 2.48 (*)    Hemoglobin 7.4 (*)    HCT 24.5 (*)    RDW 15.8 (*)    Platelets 127 (*)    All other components within normal limits  COMPREHENSIVE METABOLIC PANEL - Abnormal; Notable for the following components:   Potassium 3.2 (*)    CO2 20 (*)    Glucose, Bld 169 (*)    BUN 70 (*)    Creatinine, Ser 8.01 (*)    Calcium 8.3 (*)    Albumin 2.2 (*)    AST 9 (*)    GFR, Estimated 5 (*)    Anion gap 19 (*)    All other components within normal limits  I-STAT CHEM 8, ED - Abnormal; Notable for the following components:   Potassium 3.2 (*)    BUN 74 (*)    Creatinine, Ser 9.40 (*)    Glucose,  Bld 168 (*)    Calcium, Ion 1.05 (*)    TCO2 20 (*)    Hemoglobin 7.8 (*)    HCT 23.0 (*)    All other components within normal limits  CBG MONITORING, ED - Abnormal; Notable for the following components:   Glucose-Capillary 162 (*)    All other components within normal limits  ETHANOL  DIFFERENTIAL  AMMONIA    EKG EKG Interpretation  Date/Time:  Friday July 28 2022 07:24:32 EDT Ventricular Rate:  83 PR Interval:  137 QRS Duration: 99 QT Interval:  405 QTC Calculation: 476 R Axis:   20 Text Interpretation: Sinus rhythm Probable left atrial enlargement Left ventricular hypertrophy No significant change since last tracing Confirmed by Alvira Monday ([id number]) on 07/28/2022 10:30:58 AM  Radiology MR BRAIN WO CONTRAST  Result Date: 07/28/2022 CLINICAL DATA:  Neuro deficit, acute, stroke suspected EXAM: MRI HEAD WITHOUT CONTRAST TECHNIQUE: Multiplanar, multiecho pulse sequences of the brain and surrounding structures were obtained without intravenous contrast. COMPARISON:  CT head from today. FINDINGS: Brain: No acute infarction, hemorrhage, hydrocephalus, extra-axial collection or mass lesion. Vascular: Major arterial flow voids are maintained at the skull base. Skull and upper cervical spine: Normal marrow signal. Sinuses/Orbits: Clear sinuses.  No acute findings. Other: No mastoid effusions. IMPRESSION: No evidence of acute intracranial abnormality. Electronically Signed   By: Feliberto Harts M.D.   On: 07/28/2022 11:48   CT HEAD WO CONTRAST  Result Date: 07/28/2022 CLINICAL DATA:  Neuro deficit, acute, stroke suspected. EXAM: CT HEAD WITHOUT CONTRAST TECHNIQUE: Contiguous axial images were obtained from the base of the skull through the vertex without intravenous contrast. RADIATION DOSE REDUCTION: This exam was performed according to the departmental dose-optimization program which includes automated exposure control, adjustment of the mA and/or kV according to patient size and/or  use of iterative reconstruction technique. COMPARISON:  Head CT 04/20/2020. FINDINGS: Brain: No acute intracranial hemorrhage. Unchanged old lacunar infarct versus prominent perivascular space in the medial aspect of the left thalamus. Gray-white differentiation is otherwise preserved. No hydrocephalus or extra-axial collection. No mass effect or midline shift. Vascular: No hyperdense vessel or unexpected calcification. Skull: No calvarial fracture or suspicious bone lesion. Skull base is unremarkable. Sinuses/Orbits: Unremarkable. Other: None. IMPRESSION: No acute intracranial abnormality. Electronically Signed   By: Orvan Falconer M.D.   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   DDx includes stroke, ICH, metabolic abnormality, anemia, medication, functional, seizure, TIA.  With symptoms beginning more the 24 hours ago she is out of intervention window.  She is not describing discreet episodes and by history TIA and seizure unlikely.  Labs completed and personally eval and interpreted by me show stable anemia from time of discharge, stable thrombocytopenia, no leukocytosis, mild hypokalemia, mild elevation in BUN. No leukocytosis, left shift or fever and doubt sepsis, and feel continued outpt IV abx as scheduled ID is appropriate at this time.   EKG personally evaluated by me shows no acute rhythm abnormalities.  CT head completed shows no sign of ICH or other acute abnormalities.  Concern clinically for  CVA.  Consulted Neurolgoy and ordered MRI brain.  MRI brain returned and is normal  In the setting of no CVA on MRI, not having discrete episodes to suggest seizures, do not feel further emergent work up indicated.  Reports having slurred speech with sepsis in the past,  however do not suspect sepsis at this time. Discussed reasons to return in detail. Recommend follow up with Neurology.  Discussed with Nephrology as she is missing dialysis today--they report her outpatient dialysis center can perform dialysis today. Discharged to dialysis.  Patient discharged in stable condition with understanding of reasons to return.          Final Clinical Impression(s) / ED Diagnoses Final diagnoses:  Dysarthria    Rx / DC Orders ED Discharge Orders     None         Alvira Monday, MD 07/28/22 2138

## 2022-07-28 NOTE — ED Notes (Signed)
Patient transported to MRI 

## 2022-07-30 ENCOUNTER — Telehealth: Payer: Self-pay | Admitting: Nephrology

## 2022-07-30 NOTE — Telephone Encounter (Signed)
Transition of Care Contact from Inpatient Facility   Date of Discharge: 07/27/22, back in ED 4/26 but not admitted Date of Contact: 07/30/22 Method of contact: phone Talked to patient   Patient contacted to discuss transition of care form recent hospitaliztion. Patient was admitted to [facility] from 4/19 to 4/25 with the discharge diagnosis of new ESRD on HD, sepsis 2/2 cellulitis and myofasciitis and chronic osteomyelitis.     Medication changes were reviewed - advised to stop Na Bicarb.  Change once a day antihypertensive to night time dosing.   Patient will follow up with is outpatient dialysis center 07/31/22.  Other follow up needs include none identified.    Virgina Norfolk, PA-C [facility] Pager: [phone]

## 2022-08-03 ENCOUNTER — Encounter (HOSPITAL_COMMUNITY): Payer: Medicare Other

## 2022-08-05 ENCOUNTER — Encounter (HOSPITAL_COMMUNITY): Payer: Self-pay

## 2022-08-08 ENCOUNTER — Ambulatory Visit (INDEPENDENT_AMBULATORY_CARE_PROVIDER_SITE_OTHER): Payer: Medicare Other | Admitting: Infectious Diseases

## 2022-08-08 ENCOUNTER — Encounter: Payer: Self-pay | Admitting: Infectious Diseases

## 2022-08-08 ENCOUNTER — Other Ambulatory Visit: Payer: Self-pay

## 2022-08-08 VITALS — BP 156/98 | HR 88 | Wt 133.4 lb

## 2022-08-08 DIAGNOSIS — M86151 Other acute osteomyelitis, right femur: Secondary | ICD-10-CM | POA: Diagnosis not present

## 2022-08-08 DIAGNOSIS — S31109S Unspecified open wound of abdominal wall, unspecified quadrant without penetration into peritoneal cavity, sequela: Secondary | ICD-10-CM

## 2022-08-08 DIAGNOSIS — S31109D Unspecified open wound of abdominal wall, unspecified quadrant without penetration into peritoneal cavity, subsequent encounter: Secondary | ICD-10-CM

## 2022-08-08 DIAGNOSIS — L03116 Cellulitis of left lower limb: Secondary | ICD-10-CM | POA: Diagnosis not present

## 2022-08-08 MED ORDER — CEFADROXIL 500 MG PO CAPS: 1000.0000 mg | ORAL_CAPSULE | ORAL | 0 refills | Status: DC

## 2022-08-08 NOTE — Assessment & Plan Note (Signed)
Resolved nicely on 2 week course of IV vancomycin + ceftazidime. Pain is 100% resolved, though has some neuropathy. Her nodules are gone visually, though I can feel some left over on lower leg. Non-tender.   Will send her with on demand cefadroxil to have in the event she has another episode of cellulitis in an effort to keep her from requiring hospitalization. 2 tabs (1000mg ) at onset THEN 2 tabs after HD days for 10 days. She will contact me if she needs to begin this.

## 2022-08-08 NOTE — Patient Instructions (Addendum)
For cefadroxil - fill the prescription to have at home in the event your cellulitis returns, which it may. Take 2 capsules once at onset then take 2 capsules after HD days for 10 days. Message me if you have to start taking it.   Please send me an update about surgery plans!   [facility] at [facility] clinic [phone]  [address] [place], [place] [address]

## 2022-08-08 NOTE — Assessment & Plan Note (Addendum)
S/P evaluation by Dr. 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  Fortunately no pain in the hip or pelvis today. I asked her to update me if that changes. I think this is the most sensitive predictor for her if osteomyelitis flares and needs another treatment.

## 2022-08-08 NOTE — Assessment & Plan Note (Signed)
Stable, antibiotics did not change it for better or worse. Chronic radiated tissue will not allow it to heal, awaiting flap closure with plastics/gyn team at [facility].

## 2022-08-08 NOTE — Progress Notes (Signed)
Patient: SHAWNTAI GAUR  DOB: Aug 02, 1965 MRN: [id number] PCP: System, Provider Not In    Subjective:   CC: Hospital follow up cellulitis     HPI:  Hospitalized 4/19 - 4/25 with a/c renal failure requiring hemodialysis. She had leukocytosis and signs of SIRS. Blood cultures were negative. She improved on IV vancomycin + cefepime and was discharged on 2 weeks through 5/4 at HD center. Her left leg pain is resolved. Nodular rash has resolved. Lymphedema continues on the right leg. Has not heard back from lymphedema clinic at [facility] yet.   Regarding chronic pelvic infection - next steps will be to hear back from [facility] specialty surgery teams about bone resection, flap and closure of chronic open wound.    Review of Systems  Constitutional:  Negative for chills and fever.  HENT:  Negative for tinnitus.   Eyes:  Negative for blurred vision and photophobia.  Respiratory:  Negative for cough and sputum production.   Cardiovascular:  Negative for chest pain.  Gastrointestinal:  Negative for diarrhea, nausea and vomiting.  Genitourinary:  Negative for dysuria.  Skin:  Negative for rash.  Neurological:  Negative for headaches.     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[facility])     Outpatient Medications Prior to Visit  Medication Sig Dispense Refill   carvedilol (COREG) 12.5 MG tablet Take 2 tablets (25 mg total) by mouth 2 (two) times daily with a meal.     isosorbide mononitrate (IMDUR) 30 MG 24 hr tablet Take 1 tablet (30 mg total) by mouth daily. 30 tablet 0   lisinopril (ZESTRIL) 40 MG tablet Take 1 tablet (40 mg total) by mouth daily. 30 tablet 0   predniSONE (DELTASONE) 5 MG tablet Take 5 mg by mouth daily with breakfast.     tacrolimus (PROGRAF) 1 MG capsule Take 1 mg by mouth 2  (two) times daily.     VITAMIN D PO Take 1 tablet by mouth daily.     pantoprazole (PROTONIX) 40 MG tablet TAKE 1 TABLET (40 MG TOTAL) BY MOUTH 2 (TWO) TIMES DAILY BEFORE A MEAL. (Patient taking differently: Take 40 mg by mouth 2 (two) times daily before a meal.) 60 tablet 0   sodium bicarbonate 650 MG tablet Take 1 tablet (650 mg total) by mouth 3 (three) times daily. (Patient not taking: Reported on 08/08/2022) 90 tablet 0   No facility-administered medications prior to visit.     Allergies  Allergen Reactions   Oxycodone-Acetaminophen Itching, Nausea And Vomiting, Nausea Only, Rash and Other (See Comments)    Headaches, also    Piperacillin-Tazobactam In Dex     Other Reaction(s): Other (See Comments)  Thrombocytopenia  Thrombocytopenia   Zosyn [Piperacillin Sod-Tazobactam So] Other (See Comments)    Thrombocytopenia   Hydromorphone Nausea And Vomiting, Rash and Other (See Comments)   Amlodipine Other (See Comments) and Swelling    Ankle swelling   Codeine     vomiting   Hydralazine     Headache   Zyvox [Linezolid] Other (See Comments)    Decreased platelet counts    Social History   Tobacco Use   Smoking status: Never   Smokeless tobacco: Never  Vaping Use   Vaping Use: Never used  Substance  Use Topics   Alcohol use: Not Currently   Drug use: Never     Objective:   Vitals:   08/08/22 1346  BP: (!) 156/98  Pulse: 88  SpO2: 98%  Weight: 133 lb 6.4 oz (60.5 kg)   Body mass index is 22.2 kg/m.  Physical Exam Constitutional:      Appearance: Normal appearance. She is not ill-appearing.  HENT:     Mouth/Throat:     Mouth: Mucous membranes are moist.     Pharynx: Oropharynx is clear.  Eyes:     General: No scleral icterus. Cardiovascular:     Rate and Rhythm: Normal rate and regular rhythm.  Pulmonary:     Effort: Pulmonary effort is normal.  Musculoskeletal:     Right lower leg: Edema present.     Comments: She has pitting edema from her right foot  all the way up to her right hip.  Her skin is normal color and temperature.  Skin:    General: Skin is warm and dry.  Neurological:     Mental Status: She is oriented to person, place, and time.  Psychiatric:        Mood and Affect: Mood normal.        Thought Content: Thought content normal.     Lab Results: Lab Results  Component Value Date   WBC 9.7 07/28/2022   HGB 7.8 (L) 07/28/2022   HCT 23.0 (L) 07/28/2022   MCV 98.8 07/28/2022   PLT 127 (L) 07/28/2022    Lab Results  Component Value Date   CREATININE 9.40 (H) 07/28/2022   BUN 74 (H) 07/28/2022   NA 137 07/28/2022   K 3.2 (L) 07/28/2022   CL 101 07/28/2022   CO2 20 (L) 07/28/2022    Lab Results  Component Value Date   ALT 8 07/28/2022   AST 9 (L) 07/28/2022   ALKPHOS 67 07/28/2022   BILITOT 0.8 07/28/2022     Assessment & Plan:   Problem List Items Addressed This Visit       Unprioritized   Wound of right groin - Primary    Stable, antibiotics did not change it for better or worse. Chronic radiated tissue will not allow it to heal, awaiting flap closure with plastics/gyn team at [facility].       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 (HCC)    S/P evaluation by Dr. 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  Fortunately no pain in the hip or pelvis today. I asked her to update me if that changes. I think this is the most sensitive predictor for her if osteomyelitis flares and needs another treatment.       Relevant Medications   cefadroxil (DURICEF) 500 MG capsule   Cellulitis of left leg    Resolved nicely on 2 week course of IV vancomycin + ceftazidime. Pain is 100% resolved, though has some neuropathy. Her nodules are gone visually, though I can feel some left over on lower leg. Non-tender.   Will send her with on demand cefadroxil to have in the event she has another episode of cellulitis in an effort to keep her from  requiring hospitalization. 2 tabs (1000mg ) at onset THEN 2 tabs after HD days for 10 days. She will contact me if she needs to begin this.       Hospital records and specialty visits reviewed available in Epic.    Judeth Cornfield  Durwin Nora, MSN, NP-C [facility] for Infectious Disease [facility] Pager: [phone] Office: [phone]  08/08/22  2:22 PM

## 2022-08-23 ENCOUNTER — Telehealth: Payer: Self-pay | Admitting: *Deleted

## 2022-08-23 NOTE — Telephone Encounter (Signed)
Patient called and scheduled a follow up appt  

## 2022-08-25 ENCOUNTER — Ambulatory Visit: Payer: Medicare Other | Admitting: Gynecologic Oncology

## 2022-09-27 ENCOUNTER — Other Ambulatory Visit: Payer: Self-pay

## 2022-09-27 ENCOUNTER — Inpatient Hospital Stay (HOSPITAL_COMMUNITY)
Admission: EM | Admit: 2022-09-27 | Discharge: 2022-09-30 | DRG: 521 | Disposition: A | Discharge status: Home Health | Payer: Medicare Other | Attending: Internal Medicine | Admitting: Internal Medicine

## 2022-09-27 ENCOUNTER — Emergency Department (HOSPITAL_COMMUNITY): Payer: Medicare Other

## 2022-09-27 DIAGNOSIS — Z87448 Personal history of other diseases of urinary system: Secondary | ICD-10-CM | POA: Diagnosis not present

## 2022-09-27 DIAGNOSIS — S72002A Fracture of unspecified part of neck of left femur, initial encounter for closed fracture: Principal | ICD-10-CM | POA: Diagnosis present

## 2022-09-27 DIAGNOSIS — N186 End stage renal disease: Secondary | ICD-10-CM

## 2022-09-27 DIAGNOSIS — Y830 Surgical operation with transplant of whole organ as the cause of abnormal reaction of the patient, or of later complication, without mention of misadventure at the time of the procedure: Secondary | ICD-10-CM | POA: Diagnosis present

## 2022-09-27 DIAGNOSIS — E875 Hyperkalemia: Secondary | ICD-10-CM | POA: Diagnosis present

## 2022-09-27 DIAGNOSIS — M8660 Other chronic osteomyelitis, unspecified site: Secondary | ICD-10-CM | POA: Diagnosis present

## 2022-09-27 DIAGNOSIS — I120 Hypertensive chronic kidney disease with stage 5 chronic kidney disease or end stage renal disease: Secondary | ICD-10-CM | POA: Diagnosis present

## 2022-09-27 DIAGNOSIS — C519 Malignant neoplasm of vulva, unspecified: Secondary | ICD-10-CM | POA: Diagnosis present

## 2022-09-27 DIAGNOSIS — Z86002 Personal history of in-situ neoplasm of other and unspecified genital organs: Secondary | ICD-10-CM | POA: Diagnosis not present

## 2022-09-27 DIAGNOSIS — W010XXA Fall on same level from slipping, tripping and stumbling without subsequent striking against object, initial encounter: Secondary | ICD-10-CM | POA: Diagnosis present

## 2022-09-27 DIAGNOSIS — Z96641 Presence of right artificial hip joint: Secondary | ICD-10-CM | POA: Diagnosis present

## 2022-09-27 DIAGNOSIS — Z7901 Long term (current) use of anticoagulants: Secondary | ICD-10-CM

## 2022-09-27 DIAGNOSIS — M84454A Pathological fracture, pelvis, initial encounter for fracture: Secondary | ICD-10-CM | POA: Diagnosis present

## 2022-09-27 DIAGNOSIS — S50312A Abrasion of left elbow, initial encounter: Secondary | ICD-10-CM | POA: Diagnosis present

## 2022-09-27 DIAGNOSIS — K219 Gastro-esophageal reflux disease without esophagitis: Secondary | ICD-10-CM | POA: Diagnosis present

## 2022-09-27 DIAGNOSIS — M8668 Other chronic osteomyelitis, other site: Secondary | ICD-10-CM | POA: Diagnosis present

## 2022-09-27 DIAGNOSIS — Z79621 Long term (current) use of calcineurin inhibitor: Secondary | ICD-10-CM | POA: Diagnosis not present

## 2022-09-27 DIAGNOSIS — D84821 Immunodeficiency due to drugs: Secondary | ICD-10-CM | POA: Diagnosis present

## 2022-09-27 DIAGNOSIS — Z86718 Personal history of other venous thrombosis and embolism: Secondary | ICD-10-CM | POA: Diagnosis not present

## 2022-09-27 DIAGNOSIS — Z808 Family history of malignant neoplasm of other organs or systems: Secondary | ICD-10-CM

## 2022-09-27 DIAGNOSIS — D849 Immunodeficiency, unspecified: Secondary | ICD-10-CM | POA: Diagnosis not present

## 2022-09-27 DIAGNOSIS — T8612 Kidney transplant failure: Secondary | ICD-10-CM | POA: Diagnosis present

## 2022-09-27 DIAGNOSIS — Z8544 Personal history of malignant neoplasm of other female genital organs: Secondary | ICD-10-CM

## 2022-09-27 DIAGNOSIS — Z8249 Family history of ischemic heart disease and other diseases of the circulatory system: Secondary | ICD-10-CM | POA: Diagnosis not present

## 2022-09-27 DIAGNOSIS — Z888 Allergy status to other drugs, medicaments and biological substances status: Secondary | ICD-10-CM

## 2022-09-27 DIAGNOSIS — Z923 Personal history of irradiation: Secondary | ICD-10-CM

## 2022-09-27 DIAGNOSIS — Y92009 Unspecified place in unspecified non-institutional (private) residence as the place of occurrence of the external cause: Secondary | ICD-10-CM | POA: Diagnosis not present

## 2022-09-27 DIAGNOSIS — Z992 Dependence on renal dialysis: Secondary | ICD-10-CM | POA: Diagnosis not present

## 2022-09-27 DIAGNOSIS — D638 Anemia in other chronic diseases classified elsewhere: Secondary | ICD-10-CM | POA: Diagnosis present

## 2022-09-27 DIAGNOSIS — D631 Anemia in chronic kidney disease: Secondary | ICD-10-CM | POA: Diagnosis not present

## 2022-09-27 DIAGNOSIS — Z7952 Long term (current) use of systemic steroids: Secondary | ICD-10-CM

## 2022-09-27 DIAGNOSIS — I160 Hypertensive urgency: Secondary | ICD-10-CM | POA: Diagnosis present

## 2022-09-27 DIAGNOSIS — M898X9 Other specified disorders of bone, unspecified site: Secondary | ICD-10-CM | POA: Diagnosis present

## 2022-09-27 DIAGNOSIS — Z79899 Other long term (current) drug therapy: Secondary | ICD-10-CM

## 2022-09-27 DIAGNOSIS — W19XXXA Unspecified fall, initial encounter: Secondary | ICD-10-CM | POA: Diagnosis not present

## 2022-09-27 DIAGNOSIS — Z885 Allergy status to narcotic agent status: Secondary | ICD-10-CM

## 2022-09-27 LAB — CBC WITH DIFFERENTIAL/PLATELET
Abs Immature Granulocytes: 0.06 10*3/uL (ref 0.00–0.07)
Basophils Absolute: 0 10*3/uL (ref 0.0–0.1)
Basophils Relative: 0 %
Eosinophils Absolute: 0.1 10*3/uL (ref 0.0–0.5)
Eosinophils Relative: 2 %
HCT: 32.9 % — ABNORMAL LOW (ref 36.0–46.0)
Hemoglobin: 9.9 g/dL — ABNORMAL LOW (ref 12.0–15.0)
Immature Granulocytes: 1 %
Lymphocytes Relative: 20 %
Lymphs Abs: 1.5 10*3/uL (ref 0.7–4.0)
MCH: 28.5 pg (ref 26.0–34.0)
MCHC: 30.1 g/dL (ref 30.0–36.0)
MCV: 94.8 fL (ref 80.0–100.0)
Monocytes Absolute: 0.6 10*3/uL (ref 0.1–1.0)
Monocytes Relative: 8 %
Neutro Abs: 5 10*3/uL (ref 1.7–7.7)
Neutrophils Relative %: 69 %
Platelets: 152 10*3/uL (ref 150–400)
RBC: 3.47 MIL/uL — ABNORMAL LOW (ref 3.87–5.11)
RDW: 17.3 % — ABNORMAL HIGH (ref 11.5–15.5)
WBC: 7.3 10*3/uL (ref 4.0–10.5)
nRBC: 0 % (ref 0.0–0.2)

## 2022-09-27 LAB — BASIC METABOLIC PANEL
Anion gap: 13 (ref 5–15)
BUN: 26 mg/dL — ABNORMAL HIGH (ref 6–20)
CO2: 24 mmol/L (ref 22–32)
Calcium: 9 mg/dL (ref 8.9–10.3)
Chloride: 99 mmol/L (ref 98–111)
Creatinine, Ser: 5.27 mg/dL — ABNORMAL HIGH (ref 0.44–1.00)
GFR, Estimated: 9 mL/min — ABNORMAL LOW (ref >60)
Glucose, Bld: 134 mg/dL — ABNORMAL HIGH (ref 70–99)
Potassium: 4.6 mmol/L (ref 3.5–5.1)
Sodium: 136 mmol/L (ref 135–145)

## 2022-09-27 MED ORDER — POVIDONE-IODINE 10 % EX SWAB
2.0000 | Freq: Once | CUTANEOUS | Status: AC
  Administered 2022-09-28: 2 via TOPICAL

## 2022-09-27 MED ORDER — FENTANYL CITRATE PF 50 MCG/ML IJ SOSY
50.0000 ug | PREFILLED_SYRINGE | Freq: Once | INTRAMUSCULAR | Status: AC
  Administered 2022-09-27: 50 ug via INTRAVENOUS
  Filled 2022-09-27: qty 1

## 2022-09-27 MED ORDER — DIPHENHYDRAMINE HCL 50 MG/ML IJ SOLN: 12.5000 mg | Freq: Four times a day (QID) | INTRAMUSCULAR | Status: DC | PRN

## 2022-09-27 MED ORDER — PANTOPRAZOLE SODIUM 40 MG PO TBEC
40.0000 mg | DELAYED_RELEASE_TABLET | Freq: Two times a day (BID) | ORAL | Status: DC
  Administered 2022-09-27 – 2022-09-30 (×6): 40 mg via ORAL
  Filled 2022-09-27 (×6): qty 1

## 2022-09-27 MED ORDER — TACROLIMUS 1 MG PO CAPS
1.0000 mg | ORAL_CAPSULE | Freq: Two times a day (BID) | ORAL | Status: DC
  Administered 2022-09-27 – 2022-09-30 (×7): 1 mg via ORAL
  Filled 2022-09-27 (×7): qty 1

## 2022-09-27 MED ORDER — FENTANYL 50 MCG/ML IV PCA SOLN
INTRAVENOUS | Status: DC
  Administered 2022-09-27: 50 mL via INTRAVENOUS
  Filled 2022-09-27: qty 25

## 2022-09-27 MED ORDER — HEPARIN SODIUM (PORCINE) 5000 UNIT/ML IJ SOLN
5000.0000 [IU] | Freq: Three times a day (TID) | INTRAMUSCULAR | Status: DC
  Filled 2022-09-27: qty 1

## 2022-09-27 MED ORDER — NALOXONE HCL 0.4 MG/ML IJ SOLN: 0.4000 mg | INTRAMUSCULAR | Status: DC | PRN

## 2022-09-27 MED ORDER — VANCOMYCIN HCL IN DEXTROSE 1-5 GM/200ML-% IV SOLN
1000.0000 mg | INTRAVENOUS | Status: AC
  Administered 2022-09-28: 1000 mg via INTRAVENOUS
  Filled 2022-09-27: qty 200

## 2022-09-27 MED ORDER — HYDROCODONE-ACETAMINOPHEN 5-325 MG PO TABS
1.0000 | ORAL_TABLET | Freq: Four times a day (QID) | ORAL | Status: DC | PRN
  Administered 2022-09-27 – 2022-09-28 (×5): 1 via ORAL
  Filled 2022-09-27 (×5): qty 1

## 2022-09-27 MED ORDER — SODIUM CHLORIDE 0.9% FLUSH: 9.0000 mL | INTRAVENOUS | Status: DC | PRN

## 2022-09-27 MED ORDER — CHLORHEXIDINE GLUCONATE 4 % EX SOLN
60.0000 mL | Freq: Once | CUTANEOUS | Status: DC
  Filled 2022-09-27: qty 60

## 2022-09-27 MED ORDER — SENNOSIDES-DOCUSATE SODIUM 8.6-50 MG PO TABS
1.0000 | ORAL_TABLET | Freq: Every day | ORAL | Status: DC
  Administered 2022-09-28: 1 via ORAL
  Filled 2022-09-27 (×2): qty 1

## 2022-09-27 MED ORDER — PREDNISONE 5 MG PO TABS
5.0000 mg | ORAL_TABLET | Freq: Every day | ORAL | Status: DC
  Administered 2022-09-28 – 2022-09-30 (×3): 5 mg via ORAL
  Filled 2022-09-27 (×3): qty 1

## 2022-09-27 MED ORDER — FENTANYL CITRATE PF 50 MCG/ML IJ SOSY
50.0000 ug | PREFILLED_SYRINGE | Freq: Once | INTRAMUSCULAR | Status: AC
  Administered 2022-09-27: 50 ug via INTRAVENOUS

## 2022-09-27 MED ORDER — CARVEDILOL 25 MG PO TABS
25.0000 mg | ORAL_TABLET | Freq: Two times a day (BID) | ORAL | Status: DC
  Administered 2022-09-27 – 2022-09-30 (×6): 25 mg via ORAL
  Filled 2022-09-27 (×4): qty 1
  Filled 2022-09-27: qty 2

## 2022-09-27 MED ORDER — ONDANSETRON HCL 4 MG/2ML IJ SOLN: 4.0000 mg | Freq: Four times a day (QID) | INTRAMUSCULAR | Status: DC | PRN

## 2022-09-27 MED ORDER — HYDRALAZINE HCL 20 MG/ML IJ SOLN: 10.0000 mg | INTRAMUSCULAR | Status: DC | PRN

## 2022-09-27 MED ORDER — FENTANYL CITRATE PF 50 MCG/ML IJ SOSY
100.0000 ug | PREFILLED_SYRINGE | Freq: Once | INTRAMUSCULAR | Status: AC
  Administered 2022-09-27: 100 ug via INTRAVENOUS
  Filled 2022-09-27: qty 2

## 2022-09-27 MED ORDER — FENTANYL CITRATE PF 50 MCG/ML IJ SOSY
50.0000 ug | PREFILLED_SYRINGE | INTRAMUSCULAR | Status: DC | PRN
  Administered 2022-09-27 (×3): 50 ug via INTRAVENOUS
  Filled 2022-09-27 (×5): qty 1

## 2022-09-27 MED ORDER — CEFAZOLIN SODIUM-DEXTROSE 2-4 GM/100ML-% IV SOLN
2.0000 g | INTRAVENOUS | Status: AC
  Administered 2022-09-28: 2 g via INTRAVENOUS
  Filled 2022-09-27: qty 100

## 2022-09-27 NOTE — Progress Notes (Signed)
Patient refused heparin injection. Patient educated on importance of medication. Arlyss Queen, MD notified.  Melony Overly, RN

## 2022-09-27 NOTE — Consult Note (Signed)
Renal Service Consult Note [facility]  [name] 09/27/2022 Christina Krabbe, MD Requesting Physician: Dr. Arlyss Queen  Reason for Consult: ESRD pt w/ hip fracture HPI: The patient is a 57 y.o. year-old w/ PMH as below who presented to ED this morning after a fall w/ L hip pain. 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 In ED BP's were high 180/ 125, other VS were stable, Hb was 9.9, K+ 4.6, creat 5.27. Xrays showed acute fx of the L femoral neck and chronic fx of the R superior pubic ramus. CXR negative. Ortho was consulted and pt was admitted.  We are asked to see for dialysis.   Pt seen in room, family is at the bedside. Pt is in a lot of pain and unable to give a hx.    ROS - n/a  Past Medical History  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[facility])    Past Surgical History  Past Surgical History:  Procedure Laterality Date   APPLICATION OF WOUND VAC Right 01/22/2022   Procedure: APPLICATION OF WOUND VAC;  Surgeon: Tarry Kos, MD;  Location: [facility] OR;  Service: Orthopedics;  Laterality: Right;   BIOPSY  04/24/2020   Procedure: BIOPSY;  Surgeon: Sherrilyn Rist, MD;  Location: [facility] ENDOSCOPY;  Service: Gastroenterology;;   BIOPSY  05/27/2022   Procedure: BIOPSY;  Surgeon: Tressia Danas, MD;  Location: [facility] ENDOSCOPY;  Service: Gastroenterology;;   COLONOSCOPY WITH PROPOFOL N/A 05/27/2022   Procedure: COLONOSCOPY WITH PROPOFOL;  Surgeon: Tressia Danas, MD;  Location: [facility] ENDOSCOPY;  Service: Gastroenterology;  Laterality: N/A;   ESOPHAGOGASTRODUODENOSCOPY (EGD) WITH PROPOFOL N/A 04/24/2020   Procedure: ESOPHAGOGASTRODUODENOSCOPY (EGD) WITH PROPOFOL;  Surgeon: Sherrilyn Rist, MD;  Location: [facility] ENDOSCOPY;  Service: Gastroenterology;  Laterality: N/A;   ESOPHAGOGASTRODUODENOSCOPY (EGD) WITH PROPOFOL N/A 05/27/2022   Procedure: ESOPHAGOGASTRODUODENOSCOPY (EGD) WITH PROPOFOL;  Surgeon: Tressia Danas, MD;  Location: [facility] ENDOSCOPY;  Service: Gastroenterology;  Laterality: N/A;   GIVENS CAPSULE STUDY N/A 05/28/2022   Procedure: GIVENS CAPSULE STUDY;  Surgeon: Tressia Danas, MD;  Location: [facility] ENDOSCOPY;  Service: Gastroenterology;  Laterality: N/A;   IR FLUORO GUIDE CV LINE RIGHT  03/24/2022   IR FLUORO GUIDE CV LINE RIGHT  07/24/2022   IR PERC TUN PERIT CATH WO PORT S&I /IMAG  03/24/2022   IR REMOVAL TUN CV CATH W/O FL  05/08/2022   IR US GUIDE BX ASP/DRAIN  04/30/2020   IR US GUIDE VASC ACCESS RIGHT  03/24/2022   IR US GUIDE VASC ACCESS RIGHT  07/24/2022   KIDNEY TRANSPLANT  2001   KIDNEY TRANSPLANT  2014   LEEP     ORIF TIBIA PLATEAU Right 01/22/2022   Procedure: OPEN REDUCTION INTERNAL FIXATION (ORIF) TIBIAL PLATEAU;  Surgeon: Cammy Copa, MD;  Location: [facility] OR;  Service: Orthopedics;  Laterality: Right;   TOTAL ABDOMINAL HYSTERECTOMY     ovaries left in situ   TOTAL HIP ARTHROPLASTY Right 01/22/2022   Procedure: TOTAL HIP ARTHROPLASTY ANTERIOR APPROACH;  Surgeon: Tarry Kos, MD;  Location: [facility] OR;  Service: Orthopedics;  Laterality: Right;   Family History  Family History  Problem Relation Age of Onset   Cerebral aneurysm Mother    AAA (abdominal aortic aneurysm) Brother    Breast cancer Paternal Aunt    Heart attack Maternal Grandmother    Brain cancer Maternal Grandfather    Colon cancer Neg Hx    Ovarian cancer Neg Hx    Endometrial cancer Neg Hx    Pancreatic cancer Neg Hx    Prostate cancer Neg Hx    Social History  reports that she has never smoked. She has never used smokeless tobacco. She reports that she does not currently use alcohol. She reports that she does not use drugs. Allergies  Allergies  Allergen Reactions    Oxycodone-Acetaminophen Itching, Nausea And Vomiting, Nausea Only, Rash and Other (See Comments)    Headaches, also    Piperacillin-Tazobactam In Dex     Other Reaction(s): Other (See Comments)  Thrombocytopenia  Thrombocytopenia   Zosyn [Piperacillin Sod-Tazobactam So] Other (See Comments)    Thrombocytopenia   Hydromorphone Nausea And Vomiting, Rash and Other (See Comments)   Amlodipine Other (See Comments) and Swelling    Ankle swelling   Codeine Other (See Comments)    vomiting   Hydralazine     Headache   Zyvox [Linezolid] Other (See Comments)    Decreased platelet counts   Home medications Prior to Admission medications   Medication Sig Start Date End Date Taking? Authorizing Provider  Calcium Carbonate Antacid (TUMS PO) Take 2 tablets by mouth as needed (when eating dairy).   Yes [provider]  carvedilol (COREG) 12.5 MG tablet Take 2 tablets (25 mg total) by mouth 2 (two) times daily with a meal. Patient taking differently: Take 25 mg by mouth. Take 12.5 mg by mouth in the morning on Monday, Tuesday, Thursday , and Friday. Take 12.5 mg by mouth every evening 05/29/22  Yes Vann, Jessica U, DO  pantoprazole (PROTONIX) 40 MG tablet TAKE 1 TABLET (40 MG TOTAL) BY MOUTH 2 (TWO) TIMES DAILY BEFORE A MEAL. Patient taking differently: Take 40 mg by mouth 2 (two) times daily before a meal. 05/17/20 09/27/22 Yes Love, Evlyn Kanner, PA-C  predniSONE (DELTASONE) 5 MG tablet Take 5 mg by mouth daily with breakfast.   Yes [provider]  tacrolimus (PROGRAF) 1 MG capsule Take 1 mg by mouth 2 (two) times daily. 04/12/22  Yes [provider]  cefadroxil (DURICEF) 500 MG capsule Take 2 capsules (1,000 mg total) by mouth See admin instructions. At onset of cellulitis take 2 capsules once THEN 2 capsules after HD sessions for 7-10 days Patient not taking: Reported on 09/27/2022 08/08/22   Blanchard Kelch, NP  isosorbide mononitrate (IMDUR) 30 MG 24 hr tablet Take 1 tablet  (30 mg total) by mouth daily. Patient not taking: Reported on 09/27/2022 07/27/22 08/26/22  Hughie Closs, MD  lisinopril (ZESTRIL) 40 MG tablet Take 1 tablet (40 mg total) by mouth daily. Patient not taking: Reported on 09/27/2022 07/27/22 08/26/22  Hughie Closs, MD  sodium bicarbonate 650 MG tablet Take 1 tablet (650 mg total) by mouth 3 (three) times daily. Patient not taking: Reported on 08/08/2022 03/25/22   Noralee Stain, DO  valsartan (DIOVAN) 80 MG tablet  08/10/22   [provider]     Vitals:   09/27/22 1000 09/27/22 1015 09/27/22 1145 09/27/22 1213  BP: (!) 172/109 (!) 171/110 (!) 168/116 (!) 188/115  Pulse: 95 94 92 98  Resp: 12 16 15 19   Temp:    99 F (37.2 C)  TempSrc:    Oral  SpO2: 95% 96% 98% 99%  Weight:      Height:       Exam Gen alert, no distress No rash, cyanosis or gangrene Sclera anicteric, throat clear  No jvd or bruits Chest clear bilat to bases, no rales/ wheezing RRR no MRG Abd soft ntnd no mass or ascites +bs GU defer MS no joint effusions or deformity Ext no LE or UE edema, no wounds or ulcers Neuro is alert, Ox 3 , nf    RIJ TDC in place    Home meds include - coreg 12.5 bid, imdur 30 every day, lisinopril 40 every day, protonix, prednisone 5 qam, prograf 1 mg bid, valsartan 80 every day, prns/ vits/ supps     OP HD: [facility] MTuThF 3h  400/800   64kg  2/2 bath  TDC   Heparin 5000 - last OP HD 6/25, post wt 64kg   - rocaltrol 0.5 mcg po three times per week - mircera 225 mcg IV q 2 wks, last 6/25, due 7/09   Assessment/ Plan: L hip fracture - after a fall at home. Ortho planning for L THA tomorrow.  ESRD - usual HD is MTuThF (4x/ wk). Last HD yesterday. Labs and volume are good, no need for HD tomorrow given that she will be in OR. Will resume w/ HD schedule on Friday.  HTN/ volume - 2-3 kg under dry wt. Euvolemic on exam. Keep even next HD. BP's are high, cont home meds. Probably pain is involved.  Anemia esrd - Hb 9.9 here, last esa  was yest, next due 7/09.  MBD ckd - Ca in range, add on phos.  H/o failed renal transplant - cont po pred and prograf at low dose H/o LE lymphedema - side effect of prior cancer treatment     Christina Moselle  MD [facility] 09/27/2022, 3:04 PM  Recent Labs  Lab 09/27/22 0819  HGB 9.9*  CALCIUM 9.0  CREATININE 5.27*  K 4.6   Inpatient medications:  carvedilol  25 mg Oral BID WC   fentaNYL   Intravenous Q4H   heparin  5,000 Units Subcutaneous Q8H   pantoprazole  40 mg Oral BID AC   [START ON 09/28/2022] predniSONE  5 mg Oral Q breakfast   senna-docusate  1 tablet Oral QHS   tacrolimus  1 mg Oral BID    [START ON 09/28/2022] vancomycin     diphenhydrAMINE, fentaNYL (SUBLIMAZE) injection, hydrALAZINE, HYDROcodone-acetaminophen, naloxone **AND** sodium chloride flush, ondansetron (ZOFRAN) IV

## 2022-09-27 NOTE — ED Provider Notes (Signed)
[place] EMERGENCY DEPARTMENT AT [facility] Provider Note   CSN: [id number] Arrival date & time: 09/27/22  1884     History  Chief Complaint  Patient presents with   Fall    Trip and fall over pts dog. Pt unable to bear weight to LLE post fall. Denies LOC, hitting head or having head or neck pain. Abrasion to left elbow. Hx of dialysis and CA. +CSM to LLE.     Christina Mack is a 57 y.o. female.  Patient is a 57 year old female with a past medical history of ESRD s/p failed kidney transplant and prior cancer with right lower extremity lymphedema presenting to the emergency department with left hip pain after fall.  The patient states that earlier this morning she tripped and fell over her dog and landed on her left hip and left elbow.  She denies hitting her head or losing consciousness.  She states that she was unable to stand up or ambulate after the fall.  She denies any numbness or weakness.  She denies any other pain or injuries after the fall.  She denies any dizziness, chest pain or shortness of breath prior to the fall.  She denies any blood thinner use.  The history is provided by the patient and the spouse.  Fall       Home Medications Prior to Admission medications   Medication Sig Start Date End Date Taking? Authorizing Provider  carvedilol (COREG) 12.5 MG tablet Take 2 tablets (25 mg total) by mouth 2 (two) times daily with a meal. 05/29/22   Marlin Canary U, DO  cefadroxil (DURICEF) 500 MG capsule Take 2 capsules (1,000 mg total) by mouth See admin instructions. At onset of cellulitis take 2 capsules once THEN 2 capsules after HD sessions for 7-10 days 08/08/22   Blanchard Kelch, NP  isosorbide mononitrate (IMDUR) 30 MG 24 hr tablet Take 1 tablet (30 mg total) by mouth daily. 07/27/22 08/26/22  Hughie Closs, MD  lisinopril (ZESTRIL) 40 MG tablet Take 1 tablet (40 mg total) by mouth daily. 07/27/22 08/26/22  Hughie Closs, MD  pantoprazole (PROTONIX) 40 MG  tablet TAKE 1 TABLET (40 MG TOTAL) BY MOUTH 2 (TWO) TIMES DAILY BEFORE A MEAL. Patient taking differently: Take 40 mg by mouth 2 (two) times daily before a meal. 05/17/20 07/28/22  Love, Evlyn Kanner, PA-C  predniSONE (DELTASONE) 5 MG tablet Take 5 mg by mouth daily with breakfast.    [provider]  sodium bicarbonate 650 MG tablet Take 1 tablet (650 mg total) by mouth 3 (three) times daily. Patient not taking: Reported on 08/08/2022 03/25/22   Noralee Stain, DO  tacrolimus (PROGRAF) 1 MG capsule Take 1 mg by mouth 2 (two) times daily. 04/12/22   [provider]  VITAMIN D PO Take 1 tablet by mouth daily.    [provider]      Allergies    Oxycodone-acetaminophen, Piperacillin-tazobactam in dex, Zosyn [piperacillin sod-tazobactam so], Hydromorphone, Amlodipine, Codeine, Hydralazine, and Zyvox [linezolid]    Review of Systems   Review of Systems  Physical Exam Updated Vital Signs BP (!) 188/108   Pulse 87   Temp 98.2 F (36.8 C) (Oral)   Resp 18   Ht 5\' 5"  (1.651 m)   Wt 61.2 kg   SpO2 98%   BMI 22.47 kg/m  Physical Exam Vitals and nursing note reviewed.  Constitutional:      General: She is not in acute distress.    Appearance:  Normal appearance.  HENT:     Head: Normocephalic and atraumatic.     Nose: Nose normal.     Mouth/Throat:     Mouth: Mucous membranes are moist.  Eyes:     Extraocular Movements: Extraocular movements intact.     Conjunctiva/sclera: Conjunctivae normal.  Neck:     Comments: No midline neck tenderness Cardiovascular:     Rate and Rhythm: Normal rate and regular rhythm.     Pulses: Normal pulses.  Pulmonary:     Effort: Pulmonary effort is normal.  Abdominal:     General: Abdomen is flat.  Musculoskeletal:     Cervical back: Normal range of motion and neck supple.     Comments: No midline back tenderness Tenderness to palpation of left hip with limited ROM secondary to pain, no tenderness of left knee or ankle No bony  tenderness of bilateral upper extremities or right lower extremity Pelvis stable, nontender  Skin:    General: Skin is warm and dry.     Comments: Small non-bleeding abrasion to L elbow  Neurological:     General: No focal deficit present.     Mental Status: She is alert and oriented to person, place, and time.     Sensory: No sensory deficit.     Motor: No weakness.  Psychiatric:        Mood and Affect: Mood normal.        Behavior: Behavior normal.     ED Results / Procedures / Treatments   Labs (all labs ordered are listed, but only abnormal results are displayed) Labs Reviewed  BASIC METABOLIC PANEL - Abnormal; Notable for the following components:      Result Value   Glucose, Bld 134 (*)    BUN 26 (*)    Creatinine, Ser 5.27 (*)    GFR, Estimated 9 (*)    All other components within normal limits  CBC WITH DIFFERENTIAL/PLATELET - Abnormal; Notable for the following components:   RBC 3.47 (*)    Hemoglobin 9.9 (*)    HCT 32.9 (*)    RDW 17.3 (*)    All other components within normal limits    EKG None  Radiology DG Chest 1 View  Result Date: 09/27/2022 CLINICAL DATA:  Larey Seat this morning.  Severe LEFT leg pain. EXAM: CHEST  1 VIEW COMPARISON:  07/21/2022 FINDINGS: RIGHT-sided dialysis catheter tip overlies the UPPER RIGHT atrium. Heart is normal in size. There are no focal consolidations or pleural effusions. No pulmonary edema. No acute displaced rib fractures. No pneumothorax. IMPRESSION: No evidence for acute abnormality. Electronically Signed   By: Norva Pavlov M.D.   On: 09/27/2022 08:26   DG Hip Unilat With Pelvis 2-3 Views Left  Result Date: 09/27/2022 CLINICAL DATA:  Fall this morning.  Severe LEFT leg pain. EXAM: DG HIP (WITH OR WITHOUT PELVIS) 2-3V LEFT COMPARISON:  04/13/2022 FINDINGS: Positioning is nonstandard secondary due to patient position and pain. There is an acute fracture of the LEFT femoral neck with minimal displacement. RIGHT hip  arthroplasty. Chronic fracture of the RIGHT superior pubic ramus. Irregularity of the symphysis pubis and RIGHT ischium are probably chronic but warrant further characterization with better position or CT exam if needed. There are significant atherosclerotic calcifications of the pelvic vessels. IMPRESSION: 1. Acute fracture of the LEFT femoral neck. 2. Chronic fracture of the RIGHT superior pubic ramus. 3. Irregularity of the symphysis pubis and RIGHT ischium are probably chronic but warrant further characterization with better position  or CT exam if needed. Electronically Signed   By: Norva Pavlov M.D.   On: 09/27/2022 08:25    Procedures Procedures    Medications Ordered in ED Medications  fentaNYL (SUBLIMAZE) injection 100 mcg (has no administration in time range)  fentaNYL (SUBLIMAZE) injection 50 mcg (50 mcg Intravenous Given 09/27/22 0754)  fentaNYL (SUBLIMAZE) injection 50 mcg (50 mcg Intravenous Given 09/27/22 0858)    ED Course/ Medical Decision Making/ A&P Clinical Course as of 09/27/22 0938  Wed Sep 27, 2022  0840 Xray positive for L hip fracture, possible R pubic rami or ischium fracture but possibly chronic. Will have pelvis CT to further characterize. Orthopedics will be consulted. [VK]  0841 Anemia on labs improved from baseline. [VK]  [id number] Patient reports she would prefer not to be moved for CT if not necessary. She reports a history of osteomyelitis in her pelvis and has no right sided pain making an acute traumatic injury less likely. I'll plan to discuss with orthopedics. [VK]  2706 I spoke with Earney Hamburg PA of orthopedics who agreed that CT is not necessary at this time. He will come evaluate the patient and she will be admitted. [VK]    Clinical Course User Index [VK] Rexford Maus, DO                             Medical Decision Making This patient presents to the ED with chief complaint(s) of L hip pain, fall with pertinent past medical history of  ESRD, prior cancer which further complicates the presenting complaint. The complaint involves an extensive differential diagnosis and also carries with it a high risk of complications and morbidity.    The differential diagnosis includes hip fracture/dislocation/contusion, no other traumatic injuries seen on exam, she is neurovascularly intact making neurovascular injury unlikely, no presyncopal symptoms making syncopal fall unlikely  Additional history obtained: Additional history obtained from spouse Records reviewed Care Everywhere/External Records  ED Course and Reassessment: On patient's arrival to the emergency department she was hypertensive and uncomfortable appearing otherwise in no acute distress, left lower extremity is neurovascularly intact though she does have significant tenderness to palpation of the left hip and pain with ROM.  She will have x-rays performed to evaluate for fracture dislocation and she will be given fentanyl for pain control and she will be closely reassessed.  Independent labs interpretation:  The following labs were independently interpreted: no significant abnormality from baseline  Independent visualization of imaging: - I independently visualized the following imaging with scope of interpretation limited to determining acute life threatening conditions related to emergency care: CXR, L hip XR, which revealed L femoral neck fx  Consultation: - Consulted or discussed management/test interpretation w/ external professional: orthopedics, hospitalist  Consideration for admission or further workup: patient requires admission for her hip fx Social Determinants of health: N/A    Amount and/or Complexity of Data Reviewed Radiology: ordered.  Risk Prescription drug management.          Final Clinical Impression(s) / ED Diagnoses Final diagnoses:  Closed fracture of left hip, initial encounter [facility])    Rx / DC Orders ED Discharge Orders      None         Rexford Maus, DO 09/27/22 [phone]

## 2022-09-27 NOTE — Consult Note (Cosign Needed Addendum)
Reason for Consult:Left hip fx Referring Physician: Elayne Snare Time called: 2956 Time at bedside: 0948   Christina Mack is an 57 y.o. female.  HPI: Kristian tripped over one of her dogs last night. She had immediate left hip pain and could not get up. She was brought to the ED where x-rays showed a left hip fx and orthopedic surgery was consulted. She lives at home and does not use any assistive devices to ambulate.  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[facility])     Past Surgical History:  Procedure Laterality Date   APPLICATION OF WOUND VAC Right 01/22/2022   Procedure: APPLICATION OF WOUND VAC;  Surgeon: Tarry Kos, MD;  Location: [facility] OR;  Service: Orthopedics;  Laterality: Right;   BIOPSY  04/24/2020   Procedure: BIOPSY;  Surgeon: Sherrilyn Rist, MD;  Location: [facility] ENDOSCOPY;  Service: Gastroenterology;;   BIOPSY  05/27/2022   Procedure: BIOPSY;  Surgeon: Tressia Danas, MD;  Location: [facility] ENDOSCOPY;  Service: Gastroenterology;;   COLONOSCOPY WITH PROPOFOL N/A 05/27/2022   Procedure: COLONOSCOPY WITH PROPOFOL;  Surgeon: Tressia Danas, MD;  Location: [facility] ENDOSCOPY;  Service: Gastroenterology;  Laterality: N/A;   ESOPHAGOGASTRODUODENOSCOPY (EGD) WITH PROPOFOL N/A 04/24/2020   Procedure: ESOPHAGOGASTRODUODENOSCOPY (EGD) WITH PROPOFOL;  Surgeon: Sherrilyn Rist, MD;  Location: [facility] ENDOSCOPY;  Service: Gastroenterology;  Laterality: N/A;   ESOPHAGOGASTRODUODENOSCOPY (EGD) WITH PROPOFOL N/A 05/27/2022   Procedure: ESOPHAGOGASTRODUODENOSCOPY (EGD) WITH PROPOFOL;  Surgeon: Tressia Danas, MD;  Location: [facility] ENDOSCOPY;  Service: Gastroenterology;  Laterality: N/A;   GIVENS CAPSULE STUDY N/A 05/28/2022   Procedure: GIVENS CAPSULE STUDY;  Surgeon: Tressia Danas, MD;  Location: [facility]  ENDOSCOPY;  Service: Gastroenterology;  Laterality: N/A;   IR FLUORO GUIDE CV LINE RIGHT  03/24/2022   IR FLUORO GUIDE CV LINE RIGHT  07/24/2022   IR PERC TUN PERIT CATH WO PORT S&I /IMAG  03/24/2022   IR REMOVAL TUN CV CATH W/O FL  05/08/2022   IR US GUIDE BX ASP/DRAIN  04/30/2020   IR US GUIDE VASC ACCESS RIGHT  03/24/2022   IR US GUIDE VASC ACCESS RIGHT  07/24/2022   KIDNEY TRANSPLANT  2001   KIDNEY TRANSPLANT  2014   LEEP     ORIF TIBIA PLATEAU Right 01/22/2022   Procedure: OPEN REDUCTION INTERNAL FIXATION (ORIF) TIBIAL PLATEAU;  Surgeon: Cammy Copa, MD;  [facility]: [facility] OR;  Service: Orthopedics;  Laterality: Right;   TOTAL ABDOMINAL HYSTERECTOMY     ovaries left in situ   TOTAL HIP ARTHROPLASTY Right 01/22/2022   Procedure: TOTAL HIP ARTHROPLASTY ANTERIOR APPROACH;  Surgeon: Tarry Kos, MD;  Location: [facility] OR;  Service: Orthopedics;  Laterality: Right;    Family History  Problem Relation Age of Onset   Cerebral aneurysm Mother    AAA (abdominal aortic aneurysm) Brother    Breast cancer Paternal Aunt    Heart attack Maternal Grandmother    Brain cancer Maternal Grandfather    Colon cancer Neg Hx    Ovarian cancer Neg Hx    Endometrial cancer Neg Hx    Pancreatic cancer Neg Hx    Prostate cancer Neg Hx     Social History:  reports that she has never smoked. She has never used smokeless tobacco. She  reports that she does not currently use alcohol. She reports that she does not use drugs.  Allergies:  Allergies  Allergen Reactions   Oxycodone-Acetaminophen Itching, Nausea And Vomiting, Nausea Only, Rash and Other (See Comments)    Headaches, also    Piperacillin-Tazobactam In Dex     Other Reaction(s): Other (See Comments)  Thrombocytopenia  Thrombocytopenia   Zosyn [Piperacillin Sod-Tazobactam So] Other (See Comments)    Thrombocytopenia   Hydromorphone Nausea And Vomiting, Rash and Other (See Comments)   Amlodipine Other (See Comments) and Swelling    Ankle  swelling   Codeine     vomiting   Hydralazine     Headache   Zyvox [Linezolid] Other (See Comments)    Decreased platelet counts    Medications: I have reviewed the patient's current medications.  Results for orders placed or performed during the hospital encounter of 09/27/22 (from the past 48 hour(s))  Basic metabolic panel     Status: Abnormal   Collection Time: 09/27/22  8:19 AM  Result Value Ref Range   Sodium 136 135 - 145 mmol/L   Potassium 4.6 3.5 - 5.1 mmol/L   Chloride 99 98 - 111 mmol/L   CO2 24 22 - 32 mmol/L   Glucose, Bld 134 (H) 70 - 99 mg/dL    Comment: Glucose reference range applies only to samples taken after fasting for at least 8 hours.   BUN 26 (H) 6 - 20 mg/dL   Creatinine, Ser 1.61 (H) 0.44 - 1.00 mg/dL   Calcium 9.0 8.9 - 09.6 mg/dL   GFR, Estimated 9 (L) >60 mL/min    Comment: (NOTE) Calculated using the CKD-EPI Creatinine Equation (2021)    Anion gap 13 5 - 15    Comment: Performed at [facility], [address] [place], [place] [address]  CBC with Differential     Status: Abnormal   Collection Time: 09/27/22  8:19 AM  Result Value Ref Range   WBC 7.3 4.0 - 10.5 K/uL   RBC 3.47 (L) 3.87 - 5.11 MIL/uL   Hemoglobin 9.9 (L) 12.0 - 15.0 g/dL   HCT 98.1 (L) 19.1 - 47.8 %   MCV 94.8 80.0 - 100.0 fL   MCH 28.5 26.0 - 34.0 pg   MCHC 30.1 30.0 - 36.0 g/dL   RDW 29.5 (H) 62.1 - 30.8 %   Platelets 152 150 - 400 K/uL   nRBC 0.0 0.0 - 0.2 %   Neutrophils Relative % 69 %   Neutro Abs 5.0 1.7 - 7.7 K/uL   Lymphocytes Relative 20 %   Lymphs Abs 1.5 0.7 - 4.0 K/uL   Monocytes Relative 8 %   Monocytes Absolute 0.6 0.1 - 1.0 K/uL   Eosinophils Relative 2 %   Eosinophils Absolute 0.1 0.0 - 0.5 K/uL   Basophils Relative 0 %   Basophils Absolute 0.0 0.0 - 0.1 K/uL   Immature Granulocytes 1 %   Abs Immature Granulocytes 0.06 0.00 - 0.07 K/uL    Comment: Performed at [facility], [address] [place], [place] [address]    DG Chest 1  View  Result Date: 09/27/2022 CLINICAL DATA:  Larey Seat this morning.  Severe LEFT leg pain. EXAM: CHEST  1 VIEW COMPARISON:  07/21/2022 FINDINGS: RIGHT-sided dialysis catheter tip overlies the UPPER RIGHT atrium. Heart is normal in size. There are no focal consolidations or pleural effusions. No pulmonary edema. No acute displaced rib fractures. No pneumothorax. IMPRESSION: No evidence for acute abnormality.  Electronically Signed   By: Norva Pavlov M.D.   On: 09/27/2022 08:26   DG Hip Unilat With Pelvis 2-3 Views Left  Result Date: 09/27/2022 CLINICAL DATA:  Fall this morning.  Severe LEFT leg pain. EXAM: DG HIP (WITH OR WITHOUT PELVIS) 2-3V LEFT COMPARISON:  04/13/2022 FINDINGS: Positioning is nonstandard secondary due to patient position and pain. There is an acute fracture of the LEFT femoral neck with minimal displacement. RIGHT hip arthroplasty. Chronic fracture of the RIGHT superior pubic ramus. Irregularity of the symphysis pubis and RIGHT ischium are probably chronic but warrant further characterization with better position or CT exam if needed. There are significant atherosclerotic calcifications of the pelvic vessels. IMPRESSION: 1. Acute fracture of the LEFT femoral neck. 2. Chronic fracture of the RIGHT superior pubic ramus. 3. Irregularity of the symphysis pubis and RIGHT ischium are probably chronic but warrant further characterization with better position or CT exam if needed. Electronically Signed   By: Norva Pavlov M.D.   On: 09/27/2022 08:25    Review of Systems  HENT:  Negative for ear discharge, ear pain, hearing loss and tinnitus.   Eyes:  Negative for photophobia and pain.  Respiratory:  Negative for cough and shortness of breath.   Cardiovascular:  Negative for chest pain.  Gastrointestinal:  Negative for abdominal pain, nausea and vomiting.  Genitourinary:  Negative for dysuria, flank pain, frequency and urgency.  Musculoskeletal:  Positive for arthralgias (Left hip) and  back pain. Negative for myalgias and neck pain.  Neurological:  Negative for dizziness and headaches.  Hematological:  Does not bruise/bleed easily.  Psychiatric/Behavioral:  The patient is not nervous/anxious.    Blood pressure (!) 189/125, pulse 100, temperature 98.2 F (36.8 C), temperature source Oral, resp. rate 14, height 5\' 5"  (1.651 m), weight 61.2 kg, SpO2 96 %. Physical Exam Constitutional:      General: She is not in acute distress.    Appearance: She is well-developed. She is not diaphoretic.  HENT:     Head: Normocephalic and atraumatic.  Eyes:     General: No scleral icterus.       Right eye: No discharge.        Left eye: No discharge.     Conjunctiva/sclera: Conjunctivae normal.  Cardiovascular:     Rate and Rhythm: Normal rate and regular rhythm.  Pulmonary:     Effort: Pulmonary effort is normal. No respiratory distress.  Musculoskeletal:     Cervical back: Normal range of motion.     Comments: LLE No traumatic wounds, ecchymosis, or rash  Severe TTP hip  No knee or ankle effusion  Knee stable to varus/ valgus and anterior/posterior stress  Sens DPN, SPN, TN intact  Motor EHL, ext, flex, evers 5/5  DP 1+, PT 1+, No significant edema  Skin:    General: Skin is warm and dry.  Neurological:     Mental Status: She is alert.  Psychiatric:        Mood and Affect: Mood normal.        Behavior: Behavior normal.     Assessment/Plan: Left hip fx -- Plan THA tomorrow with Dr. Dion Saucier. Please keep NPO after MN.    Freeman Caldron, PA-C Orthopedic Surgery [phone] 09/27/2022, 9:55 AM

## 2022-09-27 NOTE — H&P (Addendum)
History and Physical    Patient: Christina Mack NWG:[id number] DOB: 12-08-1965 DOA: 09/27/2022 DOS: the patient was seen and examined on 09/27/2022 PCP: System, Provider Not In  Patient coming from: Home via EMS  Chief Complaint:  Chief Complaint  Patient presents with   Fall    Trip and fall over pts dog. Pt unable to bear weight to LLE post fall. Denies LOC, hitting head or having head or neck pain. Abrasion to left elbow. Hx of dialysis and CA. +CSM to LLE.    HPI: Christina Mack is a 57 y.o. 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  She reports tripping over her dog this morning which caused her to fall landing onto her left side.  She reported having immediate pain in the left hip for which she was not able to get up.  Denies hitting her head or loss of consciousness, but she did hit her left elbow.  Patient reports going to hemodialysis yesterday.  In the emergency department patient was noted to be afebrile with blood pressures elevated up to 189/125, and all other vital signs relatively maintained.  Labs noted hemoglobin 9.9, potassium 4.6, BUN 26, and creatinine 5.27.  X-ray of the pelvis noted acute fracture of the left femoral neck chronic fracture of the right superior pubic ramus.  X-rays of the chest noted no acute abnormality.  Dale Windsor, PA-C of orthopedics have been consulted.   Review of Systems: As mentioned in the history of present illness. All other systems reviewed and are negative. 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[facility])    Past Surgical History:  Procedure Laterality Date   APPLICATION OF WOUND VAC  Right 01/22/2022   Procedure: APPLICATION OF WOUND VAC;  Surgeon: Tarry Kos, MD;  Location: [facility] OR;  Service: Orthopedics;  Laterality: Right;   BIOPSY  04/24/2020   Procedure: BIOPSY;  Surgeon: Sherrilyn Rist, MD;  Location: [facility] ENDOSCOPY;  Service: Gastroenterology;;   BIOPSY  05/27/2022   Procedure: BIOPSY;  Surgeon: Tressia Danas, MD;  Location: [facility] ENDOSCOPY;  Service: Gastroenterology;;   COLONOSCOPY WITH PROPOFOL N/A 05/27/2022   Procedure: COLONOSCOPY WITH PROPOFOL;  Surgeon: Tressia Danas, MD;  Location: [facility] ENDOSCOPY;  Service: Gastroenterology;  Laterality: N/A;   ESOPHAGOGASTRODUODENOSCOPY (EGD) WITH PROPOFOL N/A 04/24/2020   Procedure: ESOPHAGOGASTRODUODENOSCOPY (EGD) WITH PROPOFOL;  Surgeon: Sherrilyn Rist, MD;  Location: [facility] ENDOSCOPY;  Service: Gastroenterology;  Laterality: N/A;   ESOPHAGOGASTRODUODENOSCOPY (EGD) WITH PROPOFOL N/A 05/27/2022   Procedure: ESOPHAGOGASTRODUODENOSCOPY (EGD) WITH PROPOFOL;  Surgeon: Tressia Danas, MD;  Location: [facility] ENDOSCOPY;  Service: Gastroenterology;  Laterality: N/A;   GIVENS CAPSULE STUDY N/A 05/28/2022   Procedure: GIVENS CAPSULE STUDY;  Surgeon: Tressia Danas, MD;  Location: [facility] ENDOSCOPY;  Service: Gastroenterology;  Laterality: N/A;   IR FLUORO GUIDE CV LINE RIGHT  03/24/2022   IR FLUORO GUIDE CV LINE RIGHT  07/24/2022   IR PERC TUN PERIT CATH WO PORT S&I /IMAG  03/24/2022   IR REMOVAL TUN CV CATH W/O FL  05/08/2022   IR US GUIDE BX ASP/DRAIN  04/30/2020   IR US GUIDE VASC  ACCESS RIGHT  03/24/2022   IR US GUIDE VASC ACCESS RIGHT  07/24/2022   KIDNEY TRANSPLANT  2001   KIDNEY TRANSPLANT  2014   LEEP     ORIF TIBIA PLATEAU Right 01/22/2022   Procedure: OPEN REDUCTION INTERNAL FIXATION (ORIF) TIBIAL PLATEAU;  Surgeon: Cammy Copa, MD;  Location: [facility] OR;  Service: Orthopedics;  Laterality: Right;   TOTAL ABDOMINAL HYSTERECTOMY     ovaries left in situ   TOTAL HIP ARTHROPLASTY Right 01/22/2022   Procedure: TOTAL HIP  ARTHROPLASTY ANTERIOR APPROACH;  Surgeon: Tarry Kos, MD;  Location: [facility] OR;  Service: Orthopedics;  Laterality: Right;   Social History:  reports that she has never smoked. She has never used smokeless tobacco. She reports that she does not currently use alcohol. She reports that she does not use drugs.  Allergies  Allergen Reactions   Oxycodone-Acetaminophen Itching, Nausea And Vomiting, Nausea Only, Rash and Other (See Comments)    Headaches, also    Piperacillin-Tazobactam In Dex     Other Reaction(s): Other (See Comments)  Thrombocytopenia  Thrombocytopenia   Zosyn [Piperacillin Sod-Tazobactam So] Other (See Comments)    Thrombocytopenia   Hydromorphone Nausea And Vomiting, Rash and Other (See Comments)   Amlodipine Other (See Comments) and Swelling    Ankle swelling   Codeine     vomiting   Hydralazine     Headache   Zyvox [Linezolid] Other (See Comments)    Decreased platelet counts    Family History  Problem Relation Age of Onset   Cerebral aneurysm Mother    AAA (abdominal aortic aneurysm) Brother    Breast cancer Paternal Aunt    Heart attack Maternal Grandmother    Brain cancer Maternal Grandfather    Colon cancer Neg Hx    Ovarian cancer Neg Hx    Endometrial cancer Neg Hx    Pancreatic cancer Neg Hx    Prostate cancer Neg Hx     Prior to Admission medications   Medication Sig Start Date End Date Taking? Authorizing Provider  carvedilol (COREG) 12.5 MG tablet Take 2 tablets (25 mg total) by mouth 2 (two) times daily with a meal. 05/29/22   Marlin Canary U, DO  cefadroxil (DURICEF) 500 MG capsule Take 2 capsules (1,000 mg total) by mouth See admin instructions. At onset of cellulitis take 2 capsules once THEN 2 capsules after HD sessions for 7-10 days 08/08/22   Blanchard Kelch, NP  isosorbide mononitrate (IMDUR) 30 MG 24 hr tablet Take 1 tablet (30 mg total) by mouth daily. 07/27/22 08/26/22  Hughie Closs, MD  lisinopril (ZESTRIL) 40 MG tablet Take 1 tablet  (40 mg total) by mouth daily. 07/27/22 08/26/22  Hughie Closs, MD  pantoprazole (PROTONIX) 40 MG tablet TAKE 1 TABLET (40 MG TOTAL) BY MOUTH 2 (TWO) TIMES DAILY BEFORE A MEAL. Patient taking differently: Take 40 mg by mouth 2 (two) times daily before a meal. 05/17/20 07/28/22  Love, Evlyn Kanner, PA-C  predniSONE (DELTASONE) 5 MG tablet Take 5 mg by mouth daily with breakfast.    [provider]  sodium bicarbonate 650 MG tablet Take 1 tablet (650 mg total) by mouth 3 (three) times daily. Patient not taking: Reported on 08/08/2022 03/25/22   Noralee Stain, DO  tacrolimus (PROGRAF) 1 MG capsule Take 1 mg by mouth 2 (two) times daily. 04/12/22   [provider]  VITAMIN D PO Take 1 tablet by mouth daily.    [provider]    Physical  Exam: Vitals:   09/27/22 0716 09/27/22 0745 09/27/22 0945 09/27/22 1000  BP:  (!) 184/109 (!) 189/125 (!) 172/109  Pulse:  92 100 95  Resp:  18 14 12   Temp:      TempSrc:      SpO2:  99% 96% 95%  Weight: 61.2 kg     Height: 5\' 5"  (1.651 m)       Constitutional: Middle-age female currently in distress Eyes: PERRL, lids and conjunctivae normal ENMT: Mucous membranes are moist.  Normal dentition.  Neck: normal, supple, n  Respiratory: clear to auscultation bilaterally, no wheezing, no crackles. Normal respiratory effort. No accessory muscle use.  Cardiovascular: Regular rate and rhythm, no murmurs / rubs / gallops.  Hemodialysis catheter of the upper right chest wall. Abdomen: no tenderness, no masses palpated.   Bowel sounds positive.  Musculoskeletal: no clubbing / cyanosis.  Tenderness palpation of the left hip.  Patient not readily moving leg due to severe pain. Skin: no rashes, lesions, ulcers. No induration Neurologic: CN 2-12 grossly intact. Sensation intact, DTR normal. Strength 5/5 in all 4.  Psychiatric: Normal judgment and insight. Alert and oriented x 3. Normal mood.   Data Reviewed:  Reviewed labs, imaging, and pertinent  records as noted above in HPI.  Assessment and Plan: Left hip fracture secondary fall to home Patient had a mechanical fall after tripping over her dog at home landing onto her left hip.  X-ray imaging of the pelvis noted minimally displaced left pelvic fracture.  Orthopedics has been consulted and plan on taking to the operating room later today. -Admit to medical telemetry bed -Hip fracture order set utilized. -N.p.o. for possible procedure later on today -Hydrocodone/fentanyl IV as needed for moderate to severe pain respectively.  Pain was uncontrolled for which patient was ordered a PCA pump with fentanyl. -Discussed nerve block with the patient, but declined due to fear of needles. -Orthopedics consulted, will follow-up for any further recommendations  Hypertensive urgency Initial blood pressures elevated up to 189/125.  Suspect secondary to acute pain.  Home blood pressure regimen includes Coreg 25 mg twice daily. 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  On admission patient noted to have potassium 4.6, BUN 26, Creatinine 5.27. -Continue Prograf and prednisone -Nephrology consulted for need of hemodialysis  History of vulvar cancer   Patient's status post resection with radiation.  Patient has chronic left  Anemia of chronic kidney disease Hemoglobin 9.9 which appears improved from prior baseline at discharge of 7.8 on 4/26.  -Continue to monitor  Chronic osteomyelitis  Noted on previous MRI from 07/23/2022 of the right inferior pubic ramus.  GERD -Continue Protonix twice daily   DVT prophylaxis: Heparin Advance Care Planning:   Code Status: Full Code   Consults: Orthopedics  Family Communication:   Severity of Illness: The appropriate patient status for this patient is  INPATIENT. Inpatient status is judged to be reasonable and necessary in order to provide the required intensity of service to ensure the patient's safety. The patient's presenting symptoms, physical exam findings, and initial radiographic and laboratory data in the context of their chronic comorbidities is felt to place them at high risk for further clinical deterioration. Furthermore, it is not anticipated that the patient will be medically stable for discharge from the hospital within 2 midnights of admission.   *  I certify that at the point of admission it is my clinical judgment that the patient will require inpatient hospital care spanning beyond 2 midnights from the point of admission due to high intensity of service, high risk for further deterioration and high frequency of surveillance required.*  Author: Clydie Braun, MD 09/27/2022 10:03 AM  For on call review [email or web address].
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
## 2022-09-28 ENCOUNTER — Other Ambulatory Visit: Payer: Self-pay

## 2022-09-28 ENCOUNTER — Inpatient Hospital Stay (HOSPITAL_COMMUNITY): Payer: Medicare Other

## 2022-09-28 ENCOUNTER — Inpatient Hospital Stay (HOSPITAL_COMMUNITY): Payer: Medicare Other | Admitting: Anesthesiology

## 2022-09-28 ENCOUNTER — Encounter (HOSPITAL_COMMUNITY)
Admission: EM | Disposition: A | Discharge status: Home Health | Payer: Self-pay | Source: Home / Self Care | Attending: Internal Medicine

## 2022-09-28 ENCOUNTER — Encounter (HOSPITAL_COMMUNITY): Payer: Self-pay | Admitting: Internal Medicine

## 2022-09-28 DIAGNOSIS — I120 Hypertensive chronic kidney disease with stage 5 chronic kidney disease or end stage renal disease: Secondary | ICD-10-CM

## 2022-09-28 DIAGNOSIS — S72002A Fracture of unspecified part of neck of left femur, initial encounter for closed fracture: Secondary | ICD-10-CM

## 2022-09-28 DIAGNOSIS — N186 End stage renal disease: Secondary | ICD-10-CM

## 2022-09-28 DIAGNOSIS — D631 Anemia in chronic kidney disease: Secondary | ICD-10-CM

## 2022-09-28 DIAGNOSIS — Z992 Dependence on renal dialysis: Secondary | ICD-10-CM

## 2022-09-28 HISTORY — PX: TOTAL HIP ARTHROPLASTY: SHX124

## 2022-09-28 LAB — CBC
HCT: 32.7 % — ABNORMAL LOW (ref 36.0–46.0)
Hemoglobin: 9.8 g/dL — ABNORMAL LOW (ref 12.0–15.0)
MCH: 28.2 pg (ref 26.0–34.0)
MCHC: 30 g/dL (ref 30.0–36.0)
MCV: 94.2 fL (ref 80.0–100.0)
Platelets: 176 10*3/uL (ref 150–400)
RBC: 3.47 MIL/uL — ABNORMAL LOW (ref 3.87–5.11)
RDW: 17.2 % — ABNORMAL HIGH (ref 11.5–15.5)
WBC: 7.6 10*3/uL (ref 4.0–10.5)
nRBC: 0 % (ref 0.0–0.2)

## 2022-09-28 LAB — RENAL FUNCTION PANEL
Albumin: 2.6 g/dL — ABNORMAL LOW (ref 3.5–5.0)
Anion gap: 13 (ref 5–15)
BUN: 42 mg/dL — ABNORMAL HIGH (ref 6–20)
CO2: 22 mmol/L (ref 22–32)
Calcium: 9 mg/dL (ref 8.9–10.3)
Chloride: 98 mmol/L (ref 98–111)
Creatinine, Ser: 7.08 mg/dL — ABNORMAL HIGH (ref 0.44–1.00)
GFR, Estimated: 6 mL/min — ABNORMAL LOW (ref >60)
Glucose, Bld: 97 mg/dL (ref 70–99)
Phosphorus: 7 mg/dL — ABNORMAL HIGH (ref 2.5–4.6)
Potassium: 4.9 mmol/L (ref 3.5–5.1)
Sodium: 133 mmol/L — ABNORMAL LOW (ref 135–145)

## 2022-09-28 LAB — SURGICAL PCR SCREEN
MRSA, PCR: NEGATIVE
Staphylococcus aureus: NEGATIVE

## 2022-09-28 LAB — TYPE AND SCREEN
ABO/RH(D): O POS
Antibody Screen: NEGATIVE

## 2022-09-28 SURGERY — ARTHROPLASTY, HIP, TOTAL,POSTERIOR APPROACH
Anesthesia: General | Site: Hip | Laterality: Left

## 2022-09-28 MED ORDER — ESMOLOL HCL 100 MG/10ML IV SOLN
INTRAVENOUS | Status: DC | PRN
  Administered 2022-09-28: 30 mg via INTRAVENOUS

## 2022-09-28 MED ORDER — METOCLOPRAMIDE HCL 5 MG/ML IJ SOLN: 5.0000 mg | Freq: Three times a day (TID) | INTRAMUSCULAR | Status: DC | PRN

## 2022-09-28 MED ORDER — ACETAMINOPHEN 500 MG PO TABS: 1000.0000 mg | ORAL_TABLET | Freq: Once | ORAL | Status: AC

## 2022-09-28 MED ORDER — ALUM & MAG HYDROXIDE-SIMETH 200-200-20 MG/5ML PO SUSP: 30.0000 mL | ORAL | Status: DC | PRN

## 2022-09-28 MED ORDER — PROPOFOL 1000 MG/100ML IV EMUL
INTRAVENOUS | Status: AC
  Filled 2022-09-28: qty 100

## 2022-09-28 MED ORDER — HEPARIN SODIUM (PORCINE) 1000 UNIT/ML DIALYSIS
20.0000 [IU]/kg | INTRAMUSCULAR | Status: DC | PRN
  Administered 2022-09-29: 1200 [IU] via INTRAVENOUS_CENTRAL
  Filled 2022-09-28 (×3): qty 2

## 2022-09-28 MED ORDER — LIDOCAINE-PRILOCAINE 2.5-2.5 % EX CREA: 1.0000 | TOPICAL_CREAM | CUTANEOUS | Status: DC | PRN

## 2022-09-28 MED ORDER — CHLORHEXIDINE GLUCONATE CLOTH 2 % EX PADS
6.0000 | MEDICATED_PAD | Freq: Every day | CUTANEOUS | Status: DC
  Administered 2022-09-28 – 2022-09-30 (×3): 6 via TOPICAL

## 2022-09-28 MED ORDER — DEXAMETHASONE SODIUM PHOSPHATE 10 MG/ML IJ SOLN
INTRAMUSCULAR | Status: DC | PRN
  Administered 2022-09-28: 10 mg via INTRAVENOUS

## 2022-09-28 MED ORDER — MIDAZOLAM HCL 2 MG/2ML IJ SOLN
INTRAMUSCULAR | Status: AC
  Filled 2022-09-28: qty 2

## 2022-09-28 MED ORDER — METOCLOPRAMIDE HCL 10 MG PO TABS: 5.0000 mg | ORAL_TABLET | Freq: Three times a day (TID) | ORAL | Status: DC | PRN

## 2022-09-28 MED ORDER — ONDANSETRON HCL 4 MG/2ML IJ SOLN
INTRAMUSCULAR | Status: AC
  Filled 2022-09-28: qty 2

## 2022-09-28 MED ORDER — MAGNESIUM CITRATE PO SOLN: 1.0000 | Freq: Once | ORAL | Status: DC | PRN

## 2022-09-28 MED ORDER — ONDANSETRON HCL 4 MG PO TABS: 4.0000 mg | ORAL_TABLET | Freq: Four times a day (QID) | ORAL | Status: DC | PRN

## 2022-09-28 MED ORDER — PENTAFLUOROPROP-TETRAFLUOROETH EX AERO: 1.0000 | INHALATION_SPRAY | CUTANEOUS | Status: DC | PRN

## 2022-09-28 MED ORDER — ONDANSETRON HCL 4 MG/2ML IJ SOLN: 4.0000 mg | Freq: Four times a day (QID) | INTRAMUSCULAR | Status: DC | PRN

## 2022-09-28 MED ORDER — PROPOFOL 500 MG/50ML IV EMUL
INTRAVENOUS | Status: DC | PRN
  Administered 2022-09-28: 150 ug/kg/min via INTRAVENOUS

## 2022-09-28 MED ORDER — PHENOL 1.4 % MT LIQD: 1.0000 | OROMUCOSAL | Status: DC | PRN

## 2022-09-28 MED ORDER — OXYCODONE HCL 5 MG PO TABS
10.0000 mg | ORAL_TABLET | ORAL | Status: DC | PRN
  Administered 2022-09-29: 15 mg via ORAL
  Filled 2022-09-28 (×2): qty 2
  Filled 2022-09-28: qty 3

## 2022-09-28 MED ORDER — TRANEXAMIC ACID-NACL 1000-0.7 MG/100ML-% IV SOLN
1000.0000 mg | Freq: Once | INTRAVENOUS | Status: AC
  Administered 2022-09-28: 1000 mg via INTRAVENOUS
  Filled 2022-09-28: qty 100

## 2022-09-28 MED ORDER — ACETAMINOPHEN 500 MG PO TABS
ORAL_TABLET | ORAL | Status: AC
  Administered 2022-09-28: 1000 mg via ORAL
  Filled 2022-09-28: qty 2

## 2022-09-28 MED ORDER — DOCUSATE SODIUM 100 MG PO CAPS
100.0000 mg | ORAL_CAPSULE | Freq: Two times a day (BID) | ORAL | Status: DC
  Administered 2022-09-28 – 2022-09-30 (×3): 100 mg via ORAL
  Filled 2022-09-28 (×4): qty 1

## 2022-09-28 MED ORDER — OXYCODONE HCL 5 MG PO TABS
5.0000 mg | ORAL_TABLET | ORAL | Status: DC | PRN
  Administered 2022-09-29 – 2022-09-30 (×2): 5 mg via ORAL

## 2022-09-28 MED ORDER — FENTANYL CITRATE (PF) 100 MCG/2ML IJ SOLN
INTRAMUSCULAR | Status: AC
  Filled 2022-09-28: qty 2

## 2022-09-28 MED ORDER — METHOCARBAMOL 1000 MG/10ML IJ SOLN: 500.0000 mg | Freq: Four times a day (QID) | INTRAVENOUS | Status: DC | PRN

## 2022-09-28 MED ORDER — PROPOFOL 10 MG/ML IV BOLUS
INTRAVENOUS | Status: DC | PRN
  Administered 2022-09-28: 140 mg via INTRAVENOUS
  Administered 2022-09-28: 20 mg via INTRAVENOUS

## 2022-09-28 MED ORDER — CHLORHEXIDINE GLUCONATE 0.12 % MT SOLN
OROMUCOSAL | Status: AC
  Administered 2022-09-28: 15 mL
  Filled 2022-09-28: qty 15

## 2022-09-28 MED ORDER — AMISULPRIDE (ANTIEMETIC) 5 MG/2ML IV SOLN: 10.0000 mg | Freq: Once | INTRAVENOUS | Status: DC | PRN

## 2022-09-28 MED ORDER — SUGAMMADEX SODIUM 200 MG/2ML IV SOLN
INTRAVENOUS | Status: DC | PRN
  Administered 2022-09-28: 200 mg via INTRAVENOUS

## 2022-09-28 MED ORDER — DIPHENHYDRAMINE HCL 12.5 MG/5ML PO ELIX: 12.5000 mg | ORAL_SOLUTION | ORAL | Status: DC | PRN

## 2022-09-28 MED ORDER — ONDANSETRON HCL 4 MG/2ML IJ SOLN
INTRAMUSCULAR | Status: DC | PRN
  Administered 2022-09-28: 4 mg via INTRAVENOUS

## 2022-09-28 MED ORDER — ROCURONIUM BROMIDE 10 MG/ML (PF) SYRINGE
PREFILLED_SYRINGE | INTRAVENOUS | Status: AC
  Filled 2022-09-28: qty 10

## 2022-09-28 MED ORDER — STERILE WATER FOR IRRIGATION IR SOLN
Status: DC | PRN
  Administered 2022-09-28: 1000 mL

## 2022-09-28 MED ORDER — CALCIUM CARBONATE ANTACID 500 MG PO CHEW
1.0000 | CHEWABLE_TABLET | ORAL | Status: DC | PRN
  Administered 2022-09-29: 200 mg via ORAL

## 2022-09-28 MED ORDER — BISACODYL 10 MG RE SUPP: 10.0000 mg | Freq: Every day | RECTAL | Status: DC | PRN

## 2022-09-28 MED ORDER — ROCURONIUM BROMIDE 10 MG/ML (PF) SYRINGE
PREFILLED_SYRINGE | INTRAVENOUS | Status: DC | PRN
  Administered 2022-09-28: 60 mg via INTRAVENOUS
  Administered 2022-09-28 (×2): 20 mg via INTRAVENOUS

## 2022-09-28 MED ORDER — 0.9 % SODIUM CHLORIDE (POUR BTL) OPTIME
TOPICAL | Status: DC | PRN
  Administered 2022-09-28: 1000 mL

## 2022-09-28 MED ORDER — RENA-VITE PO TABS
1.0000 | ORAL_TABLET | Freq: Every day | ORAL | Status: DC
  Administered 2022-09-29: 1 via ORAL
  Filled 2022-09-28: qty 1

## 2022-09-28 MED ORDER — ACETAMINOPHEN 500 MG PO TABS
1000.0000 mg | ORAL_TABLET | Freq: Four times a day (QID) | ORAL | Status: DC
  Administered 2022-09-28 – 2022-09-29 (×3): 1000 mg via ORAL
  Filled 2022-09-28 (×4): qty 2

## 2022-09-28 MED ORDER — ALTEPLASE 2 MG IJ SOLR: 2.0000 mg | Freq: Once | INTRAMUSCULAR | Status: DC | PRN

## 2022-09-28 MED ORDER — CEFAZOLIN SODIUM-DEXTROSE 1-4 GM/50ML-% IV SOLN
1.0000 g | Freq: Four times a day (QID) | INTRAVENOUS | Status: AC
  Administered 2022-09-28 – 2022-09-29 (×2): 1 g via INTRAVENOUS
  Filled 2022-09-28 (×2): qty 50

## 2022-09-28 MED ORDER — POLYETHYLENE GLYCOL 3350 17 G PO PACK: 17.0000 g | PACK | Freq: Every day | ORAL | Status: DC | PRN

## 2022-09-28 MED ORDER — FENTANYL CITRATE (PF) 250 MCG/5ML IJ SOLN
INTRAMUSCULAR | Status: DC | PRN
  Administered 2022-09-28 (×5): 50 ug via INTRAVENOUS

## 2022-09-28 MED ORDER — FENTANYL CITRATE (PF) 250 MCG/5ML IJ SOLN
INTRAMUSCULAR | Status: AC
  Filled 2022-09-28: qty 5

## 2022-09-28 MED ORDER — HEPARIN SODIUM (PORCINE) 1000 UNIT/ML DIALYSIS
1000.0000 [IU] | INTRAMUSCULAR | Status: DC | PRN
  Administered 2022-09-29: 1000 [IU]
  Filled 2022-09-28 (×3): qty 1

## 2022-09-28 MED ORDER — SCOPOLAMINE 1 MG/3DAYS TD PT72
MEDICATED_PATCH | TRANSDERMAL | Status: DC | PRN
  Administered 2022-09-28: 1 via TRANSDERMAL

## 2022-09-28 MED ORDER — CALCIUM CARBONATE ANTACID 500 MG PO CHEW
2.0000 | CHEWABLE_TABLET | Freq: Three times a day (TID) | ORAL | Status: DC
  Administered 2022-09-28 – 2022-09-30 (×5): 400 mg via ORAL
  Filled 2022-09-28 (×4): qty 2

## 2022-09-28 MED ORDER — ANTICOAGULANT SODIUM CITRATE 4% (200MG/5ML) IV SOLN
5.0000 mL | Status: DC | PRN
  Filled 2022-09-28: qty 5

## 2022-09-28 MED ORDER — LABETALOL HCL 5 MG/ML IV SOLN
INTRAVENOUS | Status: DC | PRN
  Administered 2022-09-28: 10 mg via INTRAVENOUS

## 2022-09-28 MED ORDER — MIDAZOLAM HCL 2 MG/2ML IJ SOLN
INTRAMUSCULAR | Status: DC | PRN
  Administered 2022-09-28: 2 mg via INTRAVENOUS

## 2022-09-28 MED ORDER — ENSURE ENLIVE PO LIQD: 237.0000 mL | Freq: Two times a day (BID) | ORAL | Status: DC

## 2022-09-28 MED ORDER — FENTANYL CITRATE (PF) 100 MCG/2ML IJ SOLN
25.0000 ug | INTRAMUSCULAR | Status: DC | PRN
  Administered 2022-09-28: 50 ug via INTRAVENOUS

## 2022-09-28 MED ORDER — FENTANYL 50 MCG/ML IV PCA SOLN
INTRAVENOUS | Status: DC | PRN
  Filled 2022-09-28: qty 25

## 2022-09-28 MED ORDER — MENTHOL 3 MG MT LOZG: 1.0000 | LOZENGE | OROMUCOSAL | Status: DC | PRN

## 2022-09-28 MED ORDER — LIDOCAINE 2% (20 MG/ML) 5 ML SYRINGE
INTRAMUSCULAR | Status: DC | PRN
  Administered 2022-09-28: 60 mg via INTRAVENOUS

## 2022-09-28 MED ORDER — APIXABAN 2.5 MG PO TABS
2.5000 mg | ORAL_TABLET | Freq: Two times a day (BID) | ORAL | Status: DC
  Administered 2022-09-29 – 2022-09-30 (×3): 2.5 mg via ORAL
  Filled 2022-09-28 (×3): qty 1

## 2022-09-28 MED ORDER — DEXAMETHASONE SODIUM PHOSPHATE 10 MG/ML IJ SOLN
INTRAMUSCULAR | Status: AC
  Filled 2022-09-28: qty 1

## 2022-09-28 MED ORDER — METHOCARBAMOL 500 MG PO TABS: 500.0000 mg | ORAL_TABLET | Freq: Four times a day (QID) | ORAL | Status: DC | PRN

## 2022-09-28 MED ORDER — LIDOCAINE HCL (PF) 1 % IJ SOLN: 5.0000 mL | INTRAMUSCULAR | Status: DC | PRN

## 2022-09-28 MED ORDER — SODIUM CHLORIDE 0.9 % IV SOLN: INTRAVENOUS | Status: DC | PRN

## 2022-09-28 SURGICAL SUPPLY — 58 items
APL SKNCLS STERI-STRIP NONHPOA (GAUZE/BANDAGES/DRESSINGS) ×2
BAG COUNTER SPONGE SURGICOUNT (BAG) ×1 IMPLANT
BAG SPNG CNTER NS LX DISP (BAG) ×1
BENZOIN TINCTURE PRP APPL 2/3 (GAUZE/BANDAGES/DRESSINGS) IMPLANT
BIT DRILL 5/64X5 DISP (BIT) ×1 IMPLANT
BLADE SAW SGTL 73X25 THK (BLADE) ×1 IMPLANT
CLSR STERI-STRIP ANTIMIC 1/2X4 (GAUZE/BANDAGES/DRESSINGS) ×2 IMPLANT
COVER SURGICAL LIGHT HANDLE (MISCELLANEOUS) ×1 IMPLANT
CUP SECTOR GRIPTON 50MM (Cup) IMPLANT
DRAPE INCISE IOBAN 66X45 STRL (DRAPES) IMPLANT
DRAPE U-SHAPE 47X51 STRL (DRAPES) ×1 IMPLANT
DRSG AQUACEL AG ADV 3.5X10 (GAUZE/BANDAGES/DRESSINGS) IMPLANT
DRSG MEPILEX POST OP 4X8 (GAUZE/BANDAGES/DRESSINGS) IMPLANT
DURAPREP 26ML APPLICATOR (WOUND CARE) ×1 IMPLANT
ELECT CAUTERY BLADE 6.4 (BLADE) ×1 IMPLANT
ELECT REM PT RETURN 9FT ADLT (ELECTROSURGICAL) ×1
ELECTRODE REM PT RTRN 9FT ADLT (ELECTROSURGICAL) ×1 IMPLANT
ELIMINATOR HOLE APEX DEPUY (Hips) IMPLANT
GLOVE BIO SURGEON STRL SZ7 (GLOVE) ×2 IMPLANT
GLOVE BIOGEL PI IND STRL 7.0 (GLOVE) ×1 IMPLANT
GLOVE ORTHO TXT STRL SZ7.5 (GLOVE) ×1 IMPLANT
GOWN STRL REUS W/ TWL LRG LVL3 (GOWN DISPOSABLE) ×1 IMPLANT
GOWN STRL REUS W/ TWL XL LVL3 (GOWN DISPOSABLE) ×1 IMPLANT
GOWN STRL REUS W/TWL LRG LVL3 (GOWN DISPOSABLE) ×1
GOWN STRL REUS W/TWL XL LVL3 (GOWN DISPOSABLE) ×1
HEAD FEMORAL 32 CERAMIC (Hips) IMPLANT
HOOD PEEL AWAY T7 (MISCELLANEOUS) ×1 IMPLANT
HOOD W/PEELAWAY (MISCELLANEOUS) ×1 IMPLANT
KIT BASIN OR (CUSTOM PROCEDURE TRAY) ×1 IMPLANT
KIT TURNOVER KIT B (KITS) ×1 IMPLANT
LINER ACET PNNCL PLUS4 NEUTRAL (Hips) IMPLANT
MANIFOLD NEPTUNE II (INSTRUMENTS) ×1 IMPLANT
NDL 18GX1X1/2 (RX/OR ONLY) (NEEDLE) ×1 IMPLANT
NEEDLE 18GX1X1/2 (RX/OR ONLY) (NEEDLE) ×1 IMPLANT
NS IRRIG 1000ML POUR BTL (IV SOLUTION) ×1 IMPLANT
PACK TOTAL JOINT (CUSTOM PROCEDURE TRAY) ×1 IMPLANT
PAD ARMBOARD 7.5X6 YLW CONV (MISCELLANEOUS) ×2 IMPLANT
PINNACLE PLUS 4 NEUTRAL (Hips) ×1 IMPLANT
PRESSURIZER FEMORAL UNIV (MISCELLANEOUS) IMPLANT
RETRIEVER SUT HEWSON (MISCELLANEOUS) ×1 IMPLANT
SCREW 6.5MMX25MM (Screw) IMPLANT
STEM OFFSET DUOFIX SZ6 STD (Stem) IMPLANT
SUCTION TUBE FRAZIER 10FR DISP (SUCTIONS) ×1 IMPLANT
SUCTION TUBE FRAZIER 12FR DISP (SUCTIONS) IMPLANT
SUT FIBERWIRE #2 38 REV NDL BL (SUTURE) ×3
SUT MNCRL AB 3-0 PS2 18 (SUTURE) IMPLANT
SUT VIC AB 0 CT1 27 (SUTURE) ×1
SUT VIC AB 0 CT1 27XBRD ANBCTR (SUTURE) ×1 IMPLANT
SUT VIC AB 1 CTX 27 (SUTURE) IMPLANT
SUT VIC AB 2-0 CT1 27 (SUTURE) ×1
SUT VIC AB 2-0 CT1 TAPERPNT 27 (SUTURE) ×1 IMPLANT
SUT VIC AB 3-0 SH 8-18 (SUTURE) ×1 IMPLANT
SUTURE FIBERWR#2 38 REV NDL BL (SUTURE) ×3 IMPLANT
SYR CONTROL 10ML LL (SYRINGE) ×1 IMPLANT
TOWEL GREEN STERILE (TOWEL DISPOSABLE) ×1 IMPLANT
TOWEL GREEN STERILE FF (TOWEL DISPOSABLE) ×1 IMPLANT
WATER STERILE IRR 1000ML POUR (IV SOLUTION) IMPLANT
YANKAUER SUCT BULB TIP NO VENT (SUCTIONS) IMPLANT

## 2022-09-28 NOTE — Progress Notes (Signed)
Patient back to unit from OR.Alert and Oriented.Vitals stable.

## 2022-09-28 NOTE — Progress Notes (Signed)
Initial Nutrition Assessment  DOCUMENTATION CODES:  Not applicable  INTERVENTION:  Renal Multivitamin w/ minerals daily Ensure Enlive po BID, each supplement provides 350 kcal and 20 grams of protein. Recommend regular diet once able.  NUTRITION DIAGNOSIS:  Increased nutrient needs related to hip fracture as evidenced by estimated needs.  GOAL:  Patient will meet greater than or equal to 90% of their needs  MONITOR:  PO intake, Supplement acceptance, Labs, I & O's  REASON FOR ASSESSMENT:  Consult Hip fracture protocol  ASSESSMENT:  57 y.o. female presented to the ED after falling at home with L hip pain. PMH includes ESRD on HD, s/p kidney transplant, vulvar cancer s/p resection and radiation, GERD, and HTN. Pt admitted with L hip fracture.  Pt being wheeled off unit at RD attempt to visit for surgery.  Per EMR, pt currently under EDW; pt with weight loss over the past few months although appears that pt was started on HD . Per Nephrology's note, pt will need EDW adjusted.  EDW: 64 kg  Medications reviewed and include: Calcium Carbonate, Protonix, Prednisone, Senokot-S, IV antibiotics  Labs reviewed: Sodium 133, Potassium 4.9, Phosphorus 7.0   NUTRITION - FOCUSED PHYSICAL EXAM:  Deferred to follow-up.   Diet Order:   Diet Order             Diet NPO time specified Except for: Sips with Meds  Diet effective midnight                  EDUCATION NEEDS: Not appropriate for education at this time  Skin:  Skin Assessment: Reviewed RN Assessment  Last BM:  unknown  Height:  Ht Readings from Last 1 Encounters:  09/28/22 5\' 5"  (1.651 m)   Weight:  Wt Readings from Last 1 Encounters:  09/28/22 61.2 kg   Ideal Body Weight:  56.8 kg  BMI:  Body mass index is 22.47 kg/m.  Estimated Nutritional Needs:  Kcal:  1800-2000 Protein:  90-110 grams Fluid:  1L + UOP   Kirby Crigler RD, LDN Clinical Dietitian See [facility] for contact information.

## 2022-09-28 NOTE — Progress Notes (Signed)
Fentanyl PCA infusion discontinued per MD(Ayiku Adela Glimpse) order.10CC of remaining fentanyl wasted with Jonathan,RN

## 2022-09-28 NOTE — Progress Notes (Signed)
Patient refused Heparin subcut injection. Pt stated, "I do not want an injection". RN educated patient on the benefits of the medication. Again, pt refused. On call MD was notified via page.

## 2022-09-28 NOTE — Transfer of Care (Signed)
Immediate Anesthesia Transfer of Care Note  Patient: Christina Mack  Procedure(s) Performed: TOTAL HIP POSTERIOR (Left: Hip)  Patient Location: PACU  Anesthesia Type:General  Level of Consciousness: awake, drowsy, and patient cooperative  Airway & Oxygen Therapy: Patient Spontanous Breathing  Post-op Assessment: Report given to RN and Post -op Vital signs reviewed and stable  Post vital signs: Reviewed and stable  Last Vitals:  Vitals Value Taken Time  BP    Temp    Pulse 84 09/28/22 1649  Resp 13 09/28/22 1649  SpO2 92 % 09/28/22 1649  Vitals shown include unvalidated device data.  Last Pain:  Vitals:   09/28/22 1151  TempSrc:   PainSc: 3       Patients Stated Pain Goal: 0 (09/28/22 0437)  Complications: No notable events documented.

## 2022-09-28 NOTE — Progress Notes (Signed)
Progress Note    Christina Mack  BJY:[id number] DOB: 11-Sep-1965  DOA: 09/27/2022 PCP: System, Provider Not In      Brief Narrative:    Medical records reviewed and are as summarized below:  Christina Mack is a 57 y.o. female with medical history significant of failure kidney transplant with ESRD on HD, right lower extremity DVT, vuvlar cancer s/p resection and radiation, right lower extremity lymphedema, who presented to the hospital because of a fall at home.  She tripped over her dog and landed on her left side.  She developed acute, severe left hip pain and was unable to get up from the floor.  She was found to have closed left hip fracture.   Assessment/Plan:   Principal Problem:   Closed left hip fracture (HCC) Active Problems:   Hypertensive urgency   Immunosuppression (HCC)   ESRD on dialysis (HCC)   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(HCC)   Anemia of chronic disease   Chronic osteomyelitis (HCC)   GERD (gastroesophageal reflux disease)    Body mass index is 22.47 kg/m.   Left hip fracture secondary fall to home Analgesics as needed for pain.  Plan for left hip surgery today.  Follow-up with orthopedic surgeon.   Hypertensive urgency BP is better.  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   -Continue Prograf and prednisone Follow-up with nephrologist for hemodialysis   History of vulvar cancer   Patient's status post resection with radiation.  Patient has chronic right leg lymphedema   Anemia of chronic kidney disease H&H is stable. Repeat CBC tomorrow   Chronic osteomyelitis  Noted on previous MRI from 07/23/2022 of the right inferior pubic ramus.   GERD -Continue Protonix twice daily     Diet Order             Diet NPO time specified Except for: Sips with Meds  Diet effective  midnight                            Consultants: Orthopedic surgeon Nephrologist  Procedures: Plan for left hip surgery today    Medications:    carvedilol  25 mg Oral BID WC   chlorhexidine  60 mL Topical Once   fentaNYL   Intravenous Q4H   heparin  5,000 Units Subcutaneous Q8H   pantoprazole  40 mg Oral BID AC   povidone-iodine  2 Application Topical Once   predniSONE  5 mg Oral Q breakfast   senna-docusate  1 tablet Oral QHS   tacrolimus  1 mg Oral BID   Continuous Infusions:   ceFAZolin (ANCEF) IV     vancomycin       Anti-infectives (From admission, onward)    Start     Dose/Rate Route Frequency Ordered Stop   09/28/22 1030  vancomycin (VANCOCIN) IVPB 1000 mg/200 mL premix        1,000 mg 200 mL/hr over 60 Minutes Intravenous On call to O.R. 09/27/22 1407 09/29/22 0559   09/28/22 0600  ceFAZolin (ANCEF) IVPB 2g/100 mL premix       Note to Pharmacy: Do not give   2 g 200 mL/hr over 30 Minutes Intravenous On call to O.R. 09/27/22 1919 09/29/22 0559              Family Communication/Anticipated  D/C date and plan/Code Status   DVT prophylaxis: heparin injection 5,000 Units Start: 09/27/22 1400     Code Status: Full Code  Family Communication: None Disposition Plan: Plan to discharge home with home health care   Status is: Inpatient Remains inpatient appropriate because: Left hip fracture        Subjective:   Interval events noted.  She complains of pain in the left hip.  She does not want heparin or Lovenox injections.  She would rather go back on Eliquis for DVT prophylaxis.  She has been using Eliquis until December 2024 for right lower extremity DVT.  Objective:    Vitals:   09/28/22 0438 09/28/22 0513 09/28/22 0711 09/28/22 0741  BP: (!) 139/93 (!) 149/93    Pulse: 97 (!) 109    Resp: 16  16   Temp: 98.8 F (37.1 C) 99 F (37.2 C)  98.6 F (37 C)  TempSrc: Oral Oral  Oral  SpO2: 94% 90% 91%   Weight:       Height:       No data found.   Intake/Output Summary (Last 24 hours) at 09/28/2022 0859 Last data filed at 09/28/2022 0000 Gross per 24 hour  Intake 240 ml  Output --  Net 240 ml   Filed Weights   09/27/22 0716  Weight: 61.2 kg    Exam:  GEN: NAD SKIN: Warm and dry EYES: No pallor or icterus ENT: MMM CV: RRR PULM: CTA B ABD: soft, ND, NT, +BS CNS: AAO x 3, non focal EXT: Left hip tenderness.  Chronic swelling of the right leg from the lymphedema.        Data Reviewed:   I have personally reviewed following labs and imaging studies:  Labs: Labs show the following:   Basic Metabolic Panel: Recent Labs  Lab 09/27/22 0819 09/28/22 0534  NA 136 133*  K 4.6 4.9  CL 99 98  CO2 24 22  GLUCOSE 134* 97  BUN 26* 42*  CREATININE 5.27* 7.08*  CALCIUM 9.0 9.0  PHOS  --  7.0*   GFR Estimated Creatinine Clearance: 8.1 mL/min (A) (by C-G formula based on SCr of 7.08 mg/dL (H)). Liver Function Tests: Recent Labs  Lab 09/28/22 0534  ALBUMIN 2.6*   No results for input(s): "LIPASE", "AMYLASE" in the last 168 hours. No results for input(s): "AMMONIA" in the last 168 hours. Coagulation profile No results for input(s): "INR", "PROTIME" in the last 168 hours.  CBC: Recent Labs  Lab 09/27/22 0819 09/28/22 0534  WBC 7.3 7.6  NEUTROABS 5.0  --   HGB 9.9* 9.8*  HCT 32.9* 32.7*  MCV 94.8 94.2  PLT 152 176   Cardiac Enzymes: No results for input(s): "CKTOTAL", "CKMB", "CKMBINDEX", "TROPONINI" in the last 168 hours. BNP (last 3 results) No results for input(s): "PROBNP" in the last 8760 hours. CBG: No results for input(s): "GLUCAP" in the last 168 hours. D-Dimer: No results for input(s): "DDIMER" in the last 72 hours. Hgb A1c: No results for input(s): "HGBA1C" in the last 72 hours. Lipid Profile: No results for input(s): "CHOL", "HDL", "LDLCALC", "TRIG", "CHOLHDL", "LDLDIRECT" in the last 72 hours. Thyroid function studies: No results for input(s):  "TSH", "T4TOTAL", "T3FREE", "THYROIDAB" in the last 72 hours.  Invalid input(s): "FREET3" Anemia work up: No results for input(s): "VITAMINB12", "FOLATE", "FERRITIN", "TIBC", "IRON", "RETICCTPCT" in the last 72 hours. Sepsis Labs: Recent Labs  Lab 09/27/22 0819 09/28/22 0534  WBC 7.3 7.6    Microbiology No results  found for this or any previous visit (from the past 240 hour(s)).  Procedures and diagnostic studies:  DG Chest 1 View  Result Date: 09/27/2022 CLINICAL DATA:  Larey Seat this morning.  Severe LEFT leg pain. EXAM: CHEST  1 VIEW COMPARISON:  07/21/2022 FINDINGS: RIGHT-sided dialysis catheter tip overlies the UPPER RIGHT atrium. Heart is normal in size. There are no focal consolidations or pleural effusions. No pulmonary edema. No acute displaced rib fractures. No pneumothorax. IMPRESSION: No evidence for acute abnormality. Electronically Signed   By: Norva Pavlov M.D.   On: 09/27/2022 08:26   DG Hip Unilat With Pelvis 2-3 Views Left  Result Date: 09/27/2022 CLINICAL DATA:  Fall this morning.  Severe LEFT leg pain. EXAM: DG HIP (WITH OR WITHOUT PELVIS) 2-3V LEFT COMPARISON:  04/13/2022 FINDINGS: Positioning is nonstandard secondary due to patient position and pain. There is an acute fracture of the LEFT femoral neck with minimal displacement. RIGHT hip arthroplasty. Chronic fracture of the RIGHT superior pubic ramus. Irregularity of the symphysis pubis and RIGHT ischium are probably chronic but warrant further characterization with better position or CT exam if needed. There are significant atherosclerotic calcifications of the pelvic vessels. IMPRESSION: 1. Acute fracture of the LEFT femoral neck. 2. Chronic fracture of the RIGHT superior pubic ramus. 3. Irregularity of the symphysis pubis and RIGHT ischium are probably chronic but warrant further characterization with better position or CT exam if needed. Electronically Signed   By: Norva Pavlov M.D.   On: 09/27/2022 08:25                LOS: 1 day   Charlton Boule  [facility]   Pager on [email or web address]. If 7PM-7AM, please contact night-coverage at [email or web address]     09/28/2022, 8:59 AM

## 2022-09-28 NOTE — Progress Notes (Signed)
  [facility] Progress Note   Subjective:  Seen in room. Family at beside. Endorses continued hip pain - to OR today for THA   Objective Vitals:   09/28/22 0438 09/28/22 0513 09/28/22 0711 09/28/22 0741  BP: (!) 139/93 (!) 149/93    Pulse: 97 (!) 109    Resp: 16  16   Temp: 98.8 F (37.1 C) 99 F (37.2 C)  98.6 F (37 C)  TempSrc: Oral Oral  Oral  SpO2: 94% 90% 91%   Weight:      Height:         Additional Objective Labs: Basic Metabolic Panel: Recent Labs  Lab 09/27/22 0819 09/28/22 0534  NA 136 133*  K 4.6 4.9  CL 99 98  CO2 24 22  GLUCOSE 134* 97  BUN 26* 42*  CREATININE 5.27* 7.08*  CALCIUM 9.0 9.0  PHOS  --  7.0*   CBC: Recent Labs  Lab 09/27/22 0819 09/28/22 0534  WBC 7.3 7.6  NEUTROABS 5.0  --   HGB 9.9* 9.8*  HCT 32.9* 32.7*  MCV 94.8 94.2  PLT 152 176   Blood Culture    Component Value Date/Time   SDES URINE, CLEAN CATCH 07/22/2022 1859   SPECREQUEST  07/22/2022 1859    NONE Performed at [facility], [address], [place], [place] [address]    CULT 10,000 COLONIES/mL PSEUDOMONAS AERUGINOSA (A) 07/22/2022 1859   REPTSTATUS 07/25/2022 FINAL 07/22/2022 1859     Physical Exam General: Lying in bed, uncomfortable, nad Heart: RRR Lungs: Clear anteriorly  Abdomen: soft non-tender Extremities: 1+ LE edema R >L  Dialysis [facility]: [facility]   Medications:   ceFAZolin (ANCEF) IV     vancomycin      carvedilol  25 mg Oral BID WC   chlorhexidine  60 mL Topical Once   fentaNYL   Intravenous Q4H   heparin  5,000 Units Subcutaneous Q8H   pantoprazole  40 mg Oral BID AC   povidone-iodine  2 Application Topical Once   predniSONE  5 mg Oral Q breakfast   senna-docusate  1 tablet Oral QHS   tacrolimus  1 mg Oral BID    OP HD:  TCU MTuThF 3h  400/800   64kg  2/2 bath  TDC   Heparin 5000 - last OP HD 6/25, post wt 64kg   - rocaltrol 0.5 mcg po three times per week - mircera 225 mcg IV q 2 wks, last 6/25, due 7/09      Assessment/ Plan: L hip fracture - after a fall at home. Ortho planning for L THA today ESRD - usual HD is MTuThF (4x/ wk). Last HD Tues. Labs and volume are good. No urgent indication for dialysis today, given that she will be in OR. Will resume w/ HD schedule on Friday.  HTN/ volume - 2-3 kg under dry wt. Needs dry weight adjusted at discharge. Minimal UF.  BP's are high, cont home meds. Probably pain is involved.  Anemia esrd - Hb 9.9 here. Last ESA 6/25, next due 7/09. Follow trends post op  MBD ckd - Ca in range. Phos above goal. OP list has Tums as binder. Follow Ca trend  H/o failed renal transplant - cont po pred and prograf at low dose H/o LE lymphedema - side effect of prior cancer treatment    Christina Mack Susann Givens PA-C [facility] 09/28/2022,9:27 AM

## 2022-09-28 NOTE — Plan of Care (Signed)

## 2022-09-28 NOTE — Anesthesia Procedure Notes (Signed)
Procedure Name: Intubation Date/Time: 09/28/2022 1:38 PM  Performed by: Stanton Kidney, CRNAPre-anesthesia Checklist: Patient identified, Patient being monitored, Timeout performed, Emergency Drugs available and Suction available Patient Re-evaluated:Patient Re-evaluated prior to induction Oxygen Delivery Method: Circle system utilized Preoxygenation: Pre-oxygenation with 100% oxygen Induction Type: IV induction Ventilation: Mask ventilation without difficulty Laryngoscope Size: 3 and Miller Grade View: Grade I Tube type: Oral Tube size: 7.5 mm Number of attempts: 1 Airway Equipment and Method: Stylet Placement Confirmation: ETT inserted through vocal cords under direct vision, positive ETCO2 and breath sounds checked- equal and bilateral Secured at: 21 cm Tube secured with: Tape Dental Injury: Teeth and Oropharynx as per pre-operative assessment

## 2022-09-28 NOTE — Progress Notes (Signed)
The risks benefits and alternatives were discussed with the patient including but not limited to the risks of nonoperative treatment, versus surgical intervention including infection, bleeding, nerve injury, periprosthetic fracture, the need for revision surgery, dislocation, leg length discrepancy, blood clots, cardiopulmonary complications, morbidity, mortality, among others, and they were willing to proceed.    Left femoral neck fracture, high risk, history of pelvic osteo, ESRD, right hip fracture.   Plan to proceed with left THA.  Eulas Post, MD

## 2022-09-28 NOTE — Anesthesia Preprocedure Evaluation (Addendum)
Anesthesia Evaluation  Patient identified by MRN, date of birth, ID band Patient awake    Reviewed: Allergy & Precautions, NPO status , Patient's Chart, lab work & pertinent test results, reviewed documented beta blocker date and time   History of Anesthesia Complications (+) PONV and history of anesthetic complications  Airway Mallampati: II  TM Distance: >3 FB Neck ROM: Full    Dental  (+) Dental Advisory Given, Teeth Intact   Pulmonary neg pulmonary ROS   Pulmonary exam normal        Cardiovascular hypertension, Pt. on medications and Pt. on home beta blockers + DVT  Normal cardiovascular exam     Neuro/Psych negative neurological ROS  negative psych ROS   GI/Hepatic Neg liver ROS, PUD,GERD  Medicated and Controlled,,  Endo/Other  negative endocrine ROS    Renal/GU ESRF and DialysisRenal disease Renal transplant s/p failure       Musculoskeletal negative musculoskeletal ROS (+)    Abdominal   Peds  Hematology  (+) Blood dyscrasia, anemia   Anesthesia Other Findings Chronic pain   Reproductive/Obstetrics                             Anesthesia Physical Anesthesia Plan  ASA: 3  Anesthesia Plan: General   Post-op Pain Management: Tylenol PO (pre-op)*   Induction: Intravenous  PONV Risk Score and Plan: 4 or greater and Treatment may vary due to age or medical condition, Ondansetron, TIVA and Midazolam  Airway Management Planned: Oral ETT  Additional Equipment: None  Intra-op Plan:   Post-operative Plan: Extubation in OR  Informed Consent: I have reviewed the patients History and Physical, chart, labs and discussed the procedure including the risks, benefits and alternatives for the proposed anesthesia with the patient or authorized representative who has indicated his/her understanding and acceptance.     Dental advisory given  Plan Discussed with: CRNA and  Anesthesiologist  Anesthesia Plan Comments:         Anesthesia Quick Evaluation

## 2022-09-28 NOTE — Progress Notes (Signed)
Pt receives out-pt HD at TCU at [facility] on [facility] with 6:40 am chair time. Spoke to RN at [employer] who states that plans had been for pt to be able to do home HD training in the near future. Will assist as needed.   Olivia Canter Renal Navigator [phone]

## 2022-09-28 NOTE — Op Note (Signed)
09/27/2022 - 09/28/2022  3:48 PM  PATIENT:  Christina Mack   MRN: [id number]  PRE-OPERATIVE DIAGNOSIS: Left displaced femoral neck fracture  POST-OPERATIVE DIAGNOSIS:  same  PROCEDURE:  Procedure(s): LEFT total hip replacement  PREOPERATIVE INDICATIONS:    Christina Mack is an 57 y.o. female who has a diagnosis of displaced left femoral neck fracture and elected for surgical management after failing conservative treatment.  The risks benefits and alternatives were discussed with the patient including but not limited to the risks of nonoperative treatment, versus surgical intervention including infection, bleeding, nerve injury, periprosthetic fracture, the need for revision surgery, dislocation, leg length discrepancy, blood clots, cardiopulmonary complications, morbidity, mortality, among others, and they were willing to proceed.  She had multiple preoperative comorbidities including end-stage renal disease, status post kidney transplant, on dialysis, with a history of apparently pelvic osteomyelitis, right hip fracture, irradiation to the vulva for the treatment of cancer, among others.   OPERATIVE REPORT     SURGEON:  Teryl Lucy, MD    ASSISTANT: Levester Fresh, PA-C, (Present throughout the entire procedure,  necessary for completion of procedure in a timely manner, assisting with retraction, instrumentation, and closure)     ANESTHESIA: General  ESTIMATED BLOOD LOSS: 700 mL    COMPLICATIONS:  None.     UNIQUE ASPECTS OF THE CASE: She had a fair amount of fibrosis of the soft tissue throughout the pelvis.  We attempted to place a Foley preoperatively because she was getting some leakage from her bladder, but placement of the Foley was essentially impossible.  There was a fair amount of necrotic tissue in the vulvar region that looked chronic.  Therefore I abandoned this attempt, after 1 pass with the Foley catheter that ended in a hard location, and did not yield urine.  She bled  somewhat consistently throughout the case, however tissue quality was mediocre, the bone quality was poor.  I reamed to a 6 on the femur, and ultimately the size 5 trial was not rotationally stable, so I ended up putting in a 6 although I wanted to be very gentle because I did not want to cause a periprosthetic fracture, and I got the 6 down to having 1 tooth proud.  She had excellent stability and I felt like the leg lengths were nearly equal.  I needed the size to restore the rotational stability, and I was very happy with the ultimate fill.  On the acetabular side I started out and tried to not ream line the line, but the cup would not fully sit down, so I ended up reaming to a size 50 and then the cup sat nicely.  I had circumferential bleeding bone.  The screw felt like it was bicortical, and I had excellent fixation with that.  I was matching her anatomy, with just a little bit of extra anteversion built into the cup.  COMPONENTS:  [employer]uy [employer] fit femur size 6 with a 32 mm + 1 ceramic head ball and a Gription Acetabular shell size 50, with a single cancellous screw for backup fixation, with an apex hole eliminator and a +4 neutral polyethylene liner.    PROCEDURE IN DETAIL:   The patient was met in the holding area and  identified.  The appropriate hip was identified and marked at the operative site.  The patient was then transported to the OR  and  placed under anesthesia.  At that point, the patient was  placed in the lateral decubitus position  with the operative side up and  secured to the operating room table and all bony prominences padded.     The operative lower extremity was prepped from the iliac crest to the distal leg.  Sterile draping was performed.  Time out was performed prior to incision.      A routine posterolateral approach was utilized via sharp dissection  carried down to the subcutaneous tissue.  Gross bleeders were Bovie coagulated.  The iliotibial band was  identified and incised along the length of the skin incision.  Self-retaining retractors were  inserted.  With the hip internally rotated, the short external rotators  were identified. The piriformis and capsule was tagged with FiberWire, and the hip capsule released in a T-type fashion.  The femoral neck was exposed, and I resected the femoral neck using the appropriate jig. This was performed at approximately a thumb's breadth above the lesser trochanter.    I then exposed the deep acetabulum, cleared out any tissue including the ligamentum teres.  A wing retractor was placed.  After adequate visualization, I excised the labrum, and then sequentially reamed.  I placed the trial acetabulum, which seated nicely, and then impacted the real cup into place.  Appropriate version and inclination was confirmed clinically matching their bony anatomy, and also with the use of the jig.  I placed a cancellous screw to augment fixation.  A trial polyethylene liner was placed and the wing retractor removed.    I then prepared the proximal femur using the cookie-cutter, the lateralizing reamer, and then sequentially reamed and broached.  A trial broach, neck, and head was utilized, and I reduced the hip and it was found to have excellent stability with functional range of motion. The trial components were then removed, and the real polyethylene liner was placed.  I then impacted the real femoral prosthesis into place into the appropriate version, slightly anteverted to the normal anatomy, and I impacted the real head ball into place. The hip was then reduced and taken through functional range of motion and found to have excellent stability. Leg lengths were restored.  I then used a 2 mm drill bits to pass the FiberWire suture from the capsule and piriformis through the greater trochanter, and secured this. Excellent posterior capsular repair was achieved. I also closed the T in the capsule.  I then irrigated the  hip copiously again with pulse lavage, and repaired the fascia with Vicryl, followed by Vicryl for the subcutaneous tissue, Monocryl for the skin, Steri-Strips and sterile gauze. The wounds were injected. The patient was then awakened and returned to PACU in stable and satisfactory condition. There were no complications.  Teryl Lucy, MD Orthopedic Surgeon [phone]   09/28/2022 3:48 PM

## 2022-09-29 ENCOUNTER — Encounter (HOSPITAL_COMMUNITY): Payer: Self-pay | Admitting: Orthopedic Surgery

## 2022-09-29 DIAGNOSIS — S72002A Fracture of unspecified part of neck of left femur, initial encounter for closed fracture: Secondary | ICD-10-CM | POA: Diagnosis not present

## 2022-09-29 LAB — RENAL FUNCTION PANEL
Albumin: 2.3 g/dL — ABNORMAL LOW (ref 3.5–5.0)
Anion gap: 17 — ABNORMAL HIGH (ref 5–15)
BUN: 63 mg/dL — ABNORMAL HIGH (ref 6–20)
CO2: 19 mmol/L — ABNORMAL LOW (ref 22–32)
Calcium: 8.5 mg/dL — ABNORMAL LOW (ref 8.9–10.3)
Chloride: 98 mmol/L (ref 98–111)
Creatinine, Ser: 8.66 mg/dL — ABNORMAL HIGH (ref 0.44–1.00)
GFR, Estimated: 5 mL/min — ABNORMAL LOW (ref >60)
Glucose, Bld: 159 mg/dL — ABNORMAL HIGH (ref 70–99)
Phosphorus: 9.2 mg/dL — ABNORMAL HIGH (ref 2.5–4.6)
Potassium: 6.1 mmol/L — ABNORMAL HIGH (ref 3.5–5.1)
Sodium: 134 mmol/L — ABNORMAL LOW (ref 135–145)

## 2022-09-29 LAB — CBC
HCT: 26.2 % — ABNORMAL LOW (ref 36.0–46.0)
Hemoglobin: 8.1 g/dL — ABNORMAL LOW (ref 12.0–15.0)
MCH: 28.4 pg (ref 26.0–34.0)
MCHC: 30.9 g/dL (ref 30.0–36.0)
MCV: 91.9 fL (ref 80.0–100.0)
Platelets: 166 10*3/uL (ref 150–400)
RBC: 2.85 MIL/uL — ABNORMAL LOW (ref 3.87–5.11)
RDW: 17 % — ABNORMAL HIGH (ref 11.5–15.5)
WBC: 6.3 10*3/uL (ref 4.0–10.5)
nRBC: 0 % (ref 0.0–0.2)

## 2022-09-29 LAB — HEPATITIS B SURFACE ANTIGEN: Hepatitis B Surface Ag: NONREACTIVE

## 2022-09-29 MED ORDER — ACETAMINOPHEN 325 MG PO TABS
650.0000 mg | ORAL_TABLET | Freq: Four times a day (QID) | ORAL | Status: DC | PRN
  Administered 2022-09-29: 650 mg via ORAL
  Filled 2022-09-29: qty 2

## 2022-09-29 MED ORDER — FENTANYL CITRATE PF 50 MCG/ML IJ SOSY: 25.0000 ug | PREFILLED_SYRINGE | INTRAMUSCULAR | Status: DC | PRN

## 2022-09-29 NOTE — Procedures (Signed)
HD Note:  Some information was entered later than the data was gathered due to patient care needs. The stated time with the data is accurate.  Received patient in bed to unit.  Alert and oriented.  Informed consent signed and in chart.   TX duration: 3 hours   Access used: Right upper chest HD catheter Access issues: None  Patient handoff report to oncoming dialysis nurse   [facility]

## 2022-09-29 NOTE — Progress Notes (Signed)
Spoke with patient, as well as with Charma Igo, PA-C.  Doing well pod 1.  WBAT.  Will follow. One of my partners, Dr. Blanchie Dessert on Sat, Dr. Eulah Pont on Sun, will be on over the weekend.    Eulas Post, MD

## 2022-09-29 NOTE — Care Management Important Message (Signed)
Important Message  Patient Details  Name: Christina Mack MRN: [id number] Date of Birth: [birth year]   Medicare Important Message Given:  Yes     Karanvir Balderston Stefan Church 09/29/2022, 1:55 PM

## 2022-09-29 NOTE — Evaluation (Signed)
Physical Therapy Evaluation Patient Details Name: Christina Mack MRN: [id number] DOB: [birth year] Today's Date: 09/29/2022  History of Present Illness  57 y/o F admitted to [facility] on 6/26 for after a fall, found to have a L closed hip fracture. L THR 6/27, now WBAT. PMHx: renal transplant on HD, vulvar CA s/p resection/radiation, RLE DVT, RLE lymphedema  Clinical Impression  Pt presents today with impaired functional mobility after a fall at home resulting in L closed hip fracture, now s/p L THR. Pt requiring increased time for all mobility due to pain, but managing supine>sitting EOB with minG for safety. MinA and use of RW required for transfer to/from [facility] and modA needed to manage BLE back into bed when pt returning to supine. Pt declining any attempts at ambulation or sitting in chair today due to pain. Acute PT will continue to follow up with pt during admission to progress mobility, recommend HHPT and RW upon discharge. Will follow as appropriate.        Recommendations for follow up therapy are one component of a multi-disciplinary discharge planning process, led by the attending physician.  Recommendations may be updated based on patient status, additional functional criteria and insurance authorization.  Follow Up Recommendations       Assistance Recommended at Discharge Frequent or constant Supervision/Assistance  Patient can return home with the following  A little help with walking and/or transfers;Assistance with cooking/housework;Assist for transportation;Help with stairs or ramp for entrance    Equipment Recommendations Rolling walker (2 wheels)  Recommendations for Other Services       Functional Status Assessment Patient has had a recent decline in their functional status and demonstrates the ability to make significant improvements in function in a reasonable and predictable amount of time.     Precautions / Restrictions Precautions Precautions: Fall Restrictions Weight  Bearing Restrictions: Yes LLE Weight Bearing: Weight bearing as tolerated      Mobility  Bed Mobility Overal bed mobility: Needs Assistance Bed Mobility: Supine to Sit, Sit to Supine     Supine to sit: Min guard, HOB elevated Sit to supine: Mod assist, HOB elevated   General bed mobility comments: increased time to get EOB, close guard for safety but pt able to perform without external support. ModA required to manage BLE back into the bed with increased time due to pain    Transfers Overall transfer level: Needs assistance Equipment used: Rolling walker (2 wheels) Transfers: Sit to/from Stand, Bed to chair/wheelchair/BSC Sit to Stand: Min assist, From elevated surface Stand pivot transfers: Min assist, From elevated surface         General transfer comment: minA to power up with increased time, cueing for technique and blocking of LLE to prevent foot from sliding. Pt transferring to/from [facility] and standing one more time from elevated bed to exchange bed pad    Ambulation/Gait               General Gait Details: pt declining any attempts at ambulation today  Stairs            Wheelchair Mobility    Modified Rankin (Stroke Patients Only)       Balance Overall balance assessment: Needs assistance Sitting-balance support: No upper extremity supported, Feet supported Sitting balance-Leahy Scale: Fair     Standing balance support: Bilateral upper extremity supported, During functional activity, Reliant on assistive device for balance Standing balance-Leahy Scale: Poor Standing balance comment: reliant on RW  Pertinent Vitals/Pain Pain Assessment Pain Assessment: 0-10 Pain Score: 3  Pain Location: L hip Pain Descriptors / Indicators: Aching, Burning Pain Intervention(s): Limited activity within patient's tolerance, Monitored during session, Repositioned, Patient requesting pain meds-RN notified, RN gave pain meds  during session    Home Living Family/patient expects to be discharged to:: Private residence Living Arrangements: Spouse/significant other Available Help at Discharge: Family;Available 24 hours/day Type of Home: House Home Access: Stairs to enter Entrance Stairs-Rails: Right Entrance Stairs-Number of Steps: 4   Home Layout: Two level;Able to live on main level with bedroom/bathroom Home Equipment: Rollator (4 wheels);Cane - single point;Shower seat;BSC/3in1;Grab bars - toilet;Grab bars - tub/shower;Hand held shower head Additional Comments: can stay on first floor, husband available and aunt will be there when husband is at work. Used to work as a Chief Technology Officer Prior Level of Function : Independent/Modified Independent;Driving;History of Falls (last six months)             Mobility Comments: independent with mobility, no AD, reports one fall causing this admission ADLs Comments: independent, drives     Hand Dominance   Dominant Hand: Right    Extremity/Trunk Assessment   Upper Extremity Assessment Upper Extremity Assessment: Overall WFL for tasks assessed    Lower Extremity Assessment Lower Extremity Assessment: Generalized weakness (assist required from pt's BUE to lift RLE against gravity, history of lymphedema and noted edema present during session. LLE limited by pain, active movement but unable to tolerate full ROM against gravity)    Cervical / Trunk Assessment Cervical / Trunk Assessment: Normal  Communication   Communication: No difficulties  Cognition Arousal/Alertness: Awake/alert Behavior During Therapy: WFL for tasks assessed/performed Overall Cognitive Status: Within Functional Limits for tasks assessed                                          General Comments General comments (skin integrity, edema, etc.): VSS on room air    Exercises     Assessment/Plan    PT Assessment Patient needs continued PT services  PT Problem  List Decreased strength;Decreased activity tolerance;Decreased balance;Decreased mobility       PT Treatment Interventions DME instruction;Gait training;Stair training;Therapeutic activities;Functional mobility training;Therapeutic exercise;Balance training;Neuromuscular re-education;Patient/family education    PT Goals (Current goals can be found in the Care Plan section)  Acute Rehab PT Goals Patient Stated Goal: go home PT Goal Formulation: With patient Time For Goal Achievement: 10/13/22 Potential to Achieve Goals: Good    Frequency Min 5X/week     Co-evaluation               AM-PAC PT "6 Clicks" Mobility  Outcome Measure Help needed turning from your back to your side while in a flat bed without using bedrails?: A Little Help needed moving from lying on your back to sitting on the side of a flat bed without using bedrails?: A Little Help needed moving to and from a bed to a chair (including a wheelchair)?: A Little Help needed standing up from a chair using your arms (e.g., wheelchair or bedside chair)?: A Little Help needed to walk in hospital room?: A Lot Help needed climbing 3-5 steps with a railing? : A Lot 6 Click Score: 16    End of Session Equipment Utilized During Treatment: Gait belt Activity Tolerance: Patient limited by pain Patient left: in bed;with call bell/phone within reach;with SCD's  reapplied Nurse Communication: Mobility status PT Visit Diagnosis: Muscle weakness (generalized) (M62.81);Difficulty in walking, not elsewhere classified (R26.2);Other abnormalities of gait and mobility (R26.89);Pain Pain - Right/Left: Left Pain - part of body: Hip    Time: 0937-1009 PT Time Calculation (min) (ACUTE ONLY): 32 min   Charges:   PT Evaluation $PT Eval Moderate Complexity: 1 Mod PT Treatments $Therapeutic Activity: 8-22 mins        Lindalou Hose, PT DPT Acute Rehabilitation Services Office [phone]   Leonie Man 09/29/2022, 2:30 PM

## 2022-09-29 NOTE — Progress Notes (Signed)
POST HD TX NOTE  09/29/22 1811  Vitals  Temp 98.4 F (36.9 C)  Temp Source Oral  BP 117/68  MAP (mmHg) 84  BP Location Left Arm  BP Method Automatic  Patient Position (if appropriate) Lying  Pulse Rate (!) 101  Pulse Rate Source Monitor  ECG Heart Rate 99  Resp 19  Oxygen Therapy  SpO2 99 %  O2 Device Room Air  Pulse Oximetry Type Continuous  During Treatment Monitoring  Intra-Hemodialysis Comments (S)   (post HD tx VS check)  Post Treatment  Dialyzer Clearance Lightly streaked  Duration of HD Treatment -hour(s) 3 hour(s)  Hemodialysis Intake (mL) 0 mL  Liters Processed 72  Fluid Removed (mL) 1000 mL  Tolerated HD Treatment Yes  Post-Hemodialysis Comments (S)  tx completed w/o problem, UF goal met, blood rinsed back, VSS. Medication Admin: Heparin 1200 unit bolus, Heparin Dwells 3200 units  Hemodialysis Catheter Right Internal jugular Double lumen Permanent (Tunneled)  Placement Date/Time: 07/24/22 0913   Serial / Lot #: [id number]  Expiration Date: 02/07/27  Time Out: Correct patient;Correct site;Correct procedure  Maximum sterile barrier precautions: Hand hygiene;Cap;Mask;Sterile gown;Sterile gloves;Large sterile ...  Site Condition No complications  Blue Lumen Status Heparin locked;Dead end cap in place  Red Lumen Status Heparin locked;Dead end cap in place  Purple Lumen Status N/A  Catheter fill solution Heparin 1000 units/ml  Catheter fill volume (Arterial) 1.6 cc  Catheter fill volume (Venous) 1.6  Dressing Type Transparent  Dressing Status Antimicrobial disc in place;Clean, Dry, Intact  Drainage Description None  Dressing Change Due 10/06/22  Post treatment catheter status Capped and Clamped

## 2022-09-29 NOTE — Progress Notes (Signed)
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  She was found to have a closed L hip fracture and underwent L THR on 6/27.    Assessment and Plan:   L closed hip fracture -Apparently mechanical fall resulting in hip fracture -Orthopedics consulted, s/p L THR by Dr. 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   -Continue Prograf and prednisone -On 4x/week HD (MTuThF) -Nephrology consulted, will need HD today   History of vulvar cancer (VIN III) -s/p resection with radiation -Chronic right leg lymphedema -Chronic R pubic wound with chronic osteomyelitis; will need "optimization of medical condition and nutrition prior to embarking on any surgical resection and flap closure" -Wound care consult for pubic wound care   Anemia of chronic kidney disease -Hgb is decreased post-operatively, as expected -Recheck CBC in AM   Chronic osteomyelitis  -Noted on previous MRI from 07/23/2022 of the right inferior pubic ramus -Followed at [facility]: Chronic R  pubic wound with chronic osteomyelitis; will need "optimization of medical condition and nutrition prior to embarking on any surgical resection and flap closure" -Wound care consult for pubic wound care   GERD -Continue Protonix twice daily      Subjective: Surgery was last night.  She was willing to get up to bedside chair with PT today but reluctant to walk yet.  No new concerns.  Adamant about no needing SNF, has worked as a Management consultant.   Physical Exam: Vitals:   09/28/22 2200 09/29/22 0000 09/29/22 0200 09/29/22 0400  BP:  (!) 129/90  118/76  Pulse: 85 79 83 80  Resp: 17 15 15 19   Temp:  97.6 F (36.4 C)  97.8 F (36.6 C)  TempSrc:  Axillary  Oral  SpO2: 93% 93% 96% 97%  Weight:      Height:       General:  Appears calm and comfortable and is in NAD Eyes:  EOMI, normal lids, iris ENT:  grossly normal hearing, lips & tongue, mmm Neck:  no LAD, masses or thyromegaly Cardiovascular:  RRR, no m/r/g. No LE edema.  Respiratory:   CTA bilaterally with no wheezes/rales/rhonchi.  Normal respiratory effort. Abdomen:  soft, NT, ND Skin:  no rash or induration seen on limited exam Musculoskeletal:  grossly normal tone BUE/BLE, no bony abnormality, R hip bandage is C/D/I Psychiatric:  grossly normal mood and affect, speech fluent and appropriate, AOx3 Neurologic:  CN 2-12 grossly intact, moves all extremities in coordinated fashion, sensation intact   Radiological Exams on Admission: Independently reviewed - see discussion in A/P where applicable  DG Hip Port Unilat With Pelvis  1V Left  Result Date: 09/28/2022 CLINICAL DATA:  Status post left hip arthroplasty. EXAM: DG HIP (WITH OR WITHOUT PELVIS) 1V PORT LEFT COMPARISON:  Preoperative radiograph FINDINGS: Left hip arthroplasty in expected alignment. No periprosthetic lucency or fracture. Recent postsurgical change includes air and edema in the soft tissues. Previous right hip arthroplasty. Chronic widening of the pubic  symphysis. IMPRESSION: Left hip arthroplasty without immediate postoperative complication. Electronically Signed   By: Narda Rutherford M.D.   On: 09/28/2022 17:50   DG Chest 1 View  Result Date: 09/27/2022 CLINICAL DATA:  Larey Seat this morning.  Severe LEFT leg pain. EXAM: CHEST  1 VIEW COMPARISON:  07/21/2022 FINDINGS: RIGHT-sided dialysis catheter tip overlies the UPPER RIGHT atrium. Heart is normal in size. There are no focal consolidations or pleural effusions. No pulmonary edema. No acute displaced rib fractures. No pneumothorax. IMPRESSION: No evidence for acute abnormality. Electronically Signed   By: Norva Pavlov M.D.   On: 09/27/2022 08:26   DG Hip Unilat With Pelvis 2-3 Views Left  Result Date: 09/27/2022 CLINICAL DATA:  Fall this morning.  Severe LEFT leg pain. EXAM: DG HIP (WITH OR WITHOUT PELVIS) 2-3V LEFT COMPARISON:  04/13/2022 FINDINGS: Positioning is nonstandard secondary due to patient position and pain. There is an acute fracture of the LEFT femoral neck with minimal displacement. RIGHT hip arthroplasty. Chronic fracture of the RIGHT superior pubic ramus. Irregularity of the symphysis pubis and RIGHT ischium are probably chronic but warrant further characterization with better position or CT exam if needed. There are significant atherosclerotic calcifications of the pelvic vessels. IMPRESSION: 1. Acute fracture of the LEFT femoral neck. 2. Chronic fracture of the RIGHT superior pubic ramus. 3. Irregularity of the symphysis pubis and RIGHT ischium are probably chronic but warrant further characterization with better position or CT exam if needed. Electronically Signed   By: Norva Pavlov M.D.   On: 09/27/2022 08:25    EKG: none   Labs on Admission: I have personally reviewed the available labs and imaging studies at the time of the admission.  Pertinent labs:    K+ 6.1 CO2 19 Glucose 159 BUN 63/Creatinine 8.66/GFR 5 Anion gap 17 Albumin 2.3 WBC 6.3 Hgb 8.1, down from 9.9  on presentation  Family Communication: None present  Disposition: Status is: Inpatient Remains inpatient appropriate because: post-operative  Planned Discharge Destination: Home with Home Health    Time spent: 50 minutes  Author: Jonah Blue, MD 09/29/2022 7:43 AM  For on call review [email or web address].

## 2022-09-29 NOTE — Progress Notes (Signed)
Contacted TCU at [facility] this afternoon. TCU can resume pt's treatments on Monday should pt be stable for d/c over the weekend. Will assist as needed.   Olivia Canter Renal Navigator [phone]

## 2022-09-29 NOTE — Consult Note (Signed)
WOC Nurse Consult Note: patient has a non-healing wound R groin that is followed by infectious disease and [facility] plastic surgery; the plan is for eventual surgical repair to this area;  patient is well versed in care of this area and uses Silver Alginate at home  Reason for Consult:chronic R groin wound  Wound type: Full thickness Pressure Injury POA: NA  Measurement: 8 cm x 1 cm x 0.2 cm  Wound bed: 50% pink moist 50% yellow slough  Drainage (amount, consistency, odor) minimal serosanguinous  Periwound: edematous hardened tissue (radiated tissue per infectious disease note 08/08/2022)  Dressing procedure/placement/frequency: Clean R groin wound with NS, apply Silver Hydrofiber to wound bed daily [facility] [place] #[id number]). Secure with peripad and mesh panties.   Patient prefers to perform own wound care and that is reasonable for this chronic wound that she manages at home.   POC discussed with patient and bedside nurse. WOC team will not follow this patient.  Re-consult if further needs arise.   Thank you,    Priscella Mann MSN, RN-BC, [employer] [phone]

## 2022-09-29 NOTE — Progress Notes (Signed)
Subjective: 1 Day Post-Op Procedure(s) (LRB): TOTAL HIP POSTERIOR (Left) Patient reports pain as mild.    Objective: Vital signs in last 24 hours: Temp:  [97.6 F (36.4 C)-99 F (37.2 C)] 97.8 F (36.6 C) (06/28 0752) Pulse Rate:  [78-102] 84 (06/28 0752) Resp:  [10-19] 17 (06/28 0752) BP: (114-163)/(73-98) 127/86 (06/28 0752) SpO2:  [91 %-99 %] 96 % (06/28 0752) FiO2 (%):  [0 %] 0 % (06/27 1802) Weight:  [61.2 kg] 61.2 kg (06/27 1125)  Intake/Output from previous day: 06/27 0701 - 06/28 0700 In: 640 [P.O.:240; I.V.:400] Out: 650 [Blood:650] Intake/Output this shift: No intake/output data recorded.  Recent Labs    09/27/22 0819 09/28/22 0534 09/29/22 0527  HGB 9.9* 9.8* 8.1*   Recent Labs    09/28/22 0534 09/29/22 0527  WBC 7.6 6.3  RBC 3.47* 2.85*  HCT 32.7* 26.2*  PLT 176 166   Recent Labs    09/28/22 0534 09/29/22 0527  NA 133* 134*  K 4.9 6.1*  CL 98 98  CO2 22 19*  BUN 42* 63*  CREATININE 7.08* 8.66*  GLUCOSE 97 159*  CALCIUM 9.0 8.5*   No results for input(s): "LABPT", "INR" in the last 72 hours.  Neurovascular intact Intact pulses distally Dorsiflexion/Plantar flexion intact Incision: dressing C/D/I   Assessment/Plan: 1 Day Post-Op Procedure(s) (LRB): TOTAL HIP POSTERIOR (Left) Up with therapy   Freeman Caldron 09/29/2022, 9:33 AM

## 2022-09-29 NOTE — TOC Initial Note (Signed)
Transition of Care [facility]) - Initial/Assessment Note    Patient Details  Name: Christina Mack MRN: [id number] Date of Birth: 01-13-1966  Transition of [facility]) CM/SW Contact:    Gordy Clement, RN Phone Number: 09/29/2022, 11:43 AM  Clinical Narrative:    CM met with patient bedside to complete initial assessment  Patient t is from home with Husband. Pt receives out-pt HD at TCU at [facility] on [address] with 6:40 am chair time. PCP is Stoney Bang in [place] . Patient is insured through Medicare A and B   Recommendations for Home Health Physical Therapy have been made.  Patient is requesting [facility] as patient has been pleased with their services in the past.  Liaison has been contacted.   Patient is also being recommended a rolling walker. [employer] will be delivering one to room prior to DC that is anticipated over the upcoming weekend.   AVS updated   Husband will transport to home at DC.  TOC will continue to follow patient for any additional discharge needs      Expected Discharge Plan: Home w Home Health Services Barriers to Discharge: Continued Medical Work up   Patient Goals and CMS Choice Patient states their goals for this hospitalization and ongoing recovery are:: Go home with Husband CMS Medicare.gov Compare Post Acute Care list provided to:: Patient Choice offered to / list presented to : Patient      Expected Discharge Plan and Services In-house Referral: NA Discharge Planning Services: CM Consult [facility] referral) Post Acute Care Choice: Home Health Living arrangements for the past 2 months: Single Family Home                 DME Arranged: Walker rolling DME Agency: [employer] Date DME Agency Contacted: 09/29/22 Time DME Agency Contacted: 1121 Representative spoke with at DME Agency: Vaughan Basta HH Arranged: PT HH Agency: [facility] (Requested - Has used in the past) Date HH Agency Contacted: 09/29/22 Time HH Agency  Contacted: 1142 Representative spoke with at [facility] Agency: Kandee Keen  Prior Living Arrangements/Services Living arrangements for the past 2 months: Single Family Home Lives with:: Spouse Patient language and need for interpreter reviewed:: Yes Do you feel safe going back to the place where you live?: Yes      Need for Family Participation in Patient Care: Yes (Comment) Care giver support system in place?: Yes (comment)   Criminal Activity/Legal Involvement Pertinent to Current Situation/Hospitalization: No - Comment as needed  Activities of Daily Living Home Assistive Devices/Equipment: Grab bars in shower, Shower chair with back ADL Screening (condition at time of admission) Patient's cognitive ability adequate to safely complete daily activities?: No Is the patient deaf or have difficulty hearing?: No Does the patient have difficulty seeing, even when wearing glasses/contacts?: No Does the patient have difficulty concentrating, remembering, or making decisions?: No Patient able to express need for assistance with ADLs?: Yes Does the patient have difficulty dressing or bathing?: No Independently performs ADLs?: Yes (appropriate for developmental age) Does the patient have difficulty walking or climbing stairs?: No Weakness of Legs: None Weakness of Arms/Hands: None  Permission Sought/Granted Permission sought to share information with : Case Manager, Other (comment) Permission granted to share information with : Yes, Verbal Permission Granted  Share Information with NAME: Case Manager  Permission granted to share info w AGENCY: [employer]/ [employer]        Emotional Assessment Appearance:: Appears stated age Attitude/Demeanor/Rapport: Gracious, Engaged Affect (typically observed): Calm, Pleasant  Orientation: : Oriented to Self, Oriented to Place, Oriented to  Time, Oriented to Situation Alcohol / Substance Use: Not Applicable Psych Involvement: No (comment)  Admission diagnosis:  Left  displaced femoral neck fracture (HCC) [S72.002A] Closed fracture of left hip, initial encounter (HCC) [S72.002A] Patient Active Problem List   Diagnosis Date Noted   Closed left hip fracture (HCC) 09/27/2022   ESRD on dialysis (HCC) 09/27/2022   Hypertensive urgency 09/27/2022   Anemia of chronic disease 09/27/2022   GERD (gastroesophageal reflux disease) 09/27/2022   Cellulitis of left leg 08/08/2022   Hypokalemia 07/23/2022   Hypomagnesemia 07/23/2022   Acute renal failure superimposed on stage 4 chronic kidney disease (HCC) 07/22/2022   Hypocalcemia 07/22/2022   History of anemia due to chronic kidney disease 07/22/2022   Gastritis and gastroduodenitis 05/27/2022   Cellulitis of right leg 05/26/2022   Lymphedema of right lower extremity 05/08/2022   Pain 03/23/2022   Renal failure 03/22/2022   Need for emotional support 03/22/2022   Goals of care, counseling/discussion 03/22/2022   Chronic osteomyelitis (HCC) 03/22/2022   Palliative care encounter 03/22/2022   Bacteremia due to Pseudomonas 03/22/2022   Pressure injury of skin 03/20/2022   AKI (acute kidney injury) (HCC) 03/19/2022   Closed fracture of lateral portion of right tibial plateau 02/03/2022   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   Pancytopenia (HCC) 01/19/2022   At risk for adverse drug interaction 10/13/2021   Long term (current) use of antibiotics 09/16/2021   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 (HCC) 05/17/2021   History of healed fragility fracture 05/17/2021   Leg weakness, bilateral 09/15/2020   Screening for malignant neoplasm of skin 05/31/2020   Immunocompromised state due to drug therapy (HCC) 05/18/2020   Chronic kidney disease (CKD), stage IV (severe) (HCC)    Chronic pain syndrome    Cancer associated pain    Debility    Thrombocytopenia (HCC) 04/26/2020   Normocytic anemia 04/26/2020   CKD (chronic kidney disease) stage 4, GFR 15-29 ml/min (HCC) 04/26/2020   Wound of  right groin 04/26/2020   Gastric ulcer without hemorrhage or perforation 04/26/2020   Malnutrition of moderate degree 04/26/2020   Acute deep vein thrombosis (DVT) of femoral vein (HCC) 12/05/2019   Iron deficiency anemia 11/10/2019   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(HCC) 11/06/2019   Sprain of anterior talofibular ligament of right ankle 11/22/2016   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 [place] [address] Phone: [phone] Fax: [phone]  [facility] [address] [place] [address] Phone: [phone] Fax: [phone]  [facility] [address] [place] [address] Phone: [phone] Fax: [phone]     Social Determinants of Health (SDOH) Social History: SDOH Screenings   Food Insecurity: No Food Insecurity (09/27/2022)  Housing: Low Risk  (09/27/2022)  Transportation Needs: No Transportation Needs (09/27/2022)  Utilities: Not At Risk (09/27/2022)  Depression (PHQ2-9): Low Risk  (05/23/2022)  Tobacco Use: Low Risk  (09/29/2022)   SDOH Interventions:     Readmission Risk Interventions    07/26/2022    4:11 PM 03/24/2022   11:54  AM  Readmission Risk Prevention Plan  Transportation Screening Complete Complete  Medication Review Oceanographer) Referral to Pharmacy Complete  PCP or Specialist appointment within 3-5 days of discharge Complete Complete  HRI or Home Care Consult Complete Complete  SW Recovery Care/Counseling Consult Complete Complete  Palliative Care Screening Not Applicable Not  Applicable  Skilled Nursing Facility Not Applicable Not Applicable

## 2022-09-29 NOTE — Hospital Course (Signed)
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  She was found to have a closed L hip fracture and underwent L THR on 6/27.

## 2022-09-29 NOTE — Progress Notes (Signed)
[facility] Progress Note   Subjective: Seen in room. HD later today. No specific complaints.     Objective Vitals:   09/29/22 0000 09/29/22 0200 09/29/22 0400 09/29/22 0752  BP: (!) 129/90  118/76 127/86  Pulse: 79 83 80 84  Resp: 15 15 19 17   Temp: 97.6 F (36.4 C)  97.8 F (36.6 C) 97.8 F (36.6 C)  TempSrc: Axillary  Oral Oral  SpO2: 93% 96% 97% 96%  Weight:      Height:       Physical Exam General: Pleasant female in NAD Heart: S1, S2 no M/R/G. SR on monitor.  Lungs: CTAB decreased in bases Abdomen: NABS, NT Extremities: Trace BLE edema Dialysis Access: RIJ TDC drsg intact   Additional Objective Labs: Basic Metabolic Panel: Recent Labs  Lab 09/27/22 0819 09/28/22 0534 09/29/22 0527  NA 136 133* 134*  K 4.6 4.9 6.1*  CL 99 98 98  CO2 24 22 19*  GLUCOSE 134* 97 159*  BUN 26* 42* 63*  CREATININE 5.27* 7.08* 8.66*  CALCIUM 9.0 9.0 8.5*  PHOS  --  7.0* 9.2*   Liver Function Tests: Recent Labs  Lab 09/28/22 0534 09/29/22 0527  ALBUMIN 2.6* 2.3*   No results for input(s): "LIPASE", "AMYLASE" in the last 168 hours. CBC: Recent Labs  Lab 09/27/22 0819 09/28/22 0534 09/29/22 0527  WBC 7.3 7.6 6.3  NEUTROABS 5.0  --   --   HGB 9.9* 9.8* 8.1*  HCT 32.9* 32.7* 26.2*  MCV 94.8 94.2 91.9  PLT 152 176 166   Blood Culture    Component Value Date/Time   SDES URINE, CLEAN CATCH 07/22/2022 1859   SPECREQUEST  07/22/2022 1859    NONE Performed at [facility], [address] [place], [place] [address]    CULT 10,000 COLONIES/mL PSEUDOMONAS AERUGINOSA (A) 07/22/2022 1859   REPTSTATUS 07/25/2022 FINAL 07/22/2022 1859    Cardiac Enzymes: No results for input(s): "CKTOTAL", "CKMB", "CKMBINDEX", "TROPONINI" in the last 168 hours. CBG: No results for input(s): "GLUCAP" in the last 168 hours. Iron Studies: No results for input(s): "IRON", "TIBC", "TRANSFERRIN", "FERRITIN" in the last 72 hours. @lablastinr3 @ Studies/Results: DG  Hip Port Unilat With Pelvis 1V Left  Result Date: 09/28/2022 CLINICAL DATA:  Status post left hip arthroplasty. EXAM: DG HIP (WITH OR WITHOUT PELVIS) 1V PORT LEFT COMPARISON:  Preoperative radiograph FINDINGS: Left hip arthroplasty in expected alignment. No periprosthetic lucency or fracture. Recent postsurgical change includes air and edema in the soft tissues. Previous right hip arthroplasty. Chronic widening of the pubic symphysis. IMPRESSION: Left hip arthroplasty without immediate postoperative complication. Electronically Signed   By: Narda Rutherford M.D.   On: 09/28/2022 17:50   Medications:  anticoagulant sodium citrate     methocarbamol (ROBAXIN) IV      acetaminophen  1,000 mg Oral Q6H   apixaban  2.5 mg Oral Q12H   calcium carbonate  2 tablet Oral TID WC   carvedilol  25 mg Oral BID WC   Chlorhexidine Gluconate Cloth  6 each Topical Q0600   docusate sodium  100 mg Oral BID   feeding supplement  237 mL Oral BID BM   multivitamin  1 tablet Oral QHS   pantoprazole  40 mg Oral BID AC   predniSONE  5 mg Oral Q breakfast   senna-docusate  1 tablet Oral QHS   tacrolimus  1 mg Oral BID     OP HD:  TCU MTuThF 3h  400/800   64kg  2/2 bath  TDC   Heparin 5000 - last OP HD 6/25, post wt 64kg   - rocaltrol 0.5 mcg po three times per week - mircera 225 mcg IV q 2 wks, last 6/25, due 7/09     Assessment/ Plan: L hip fracture - after a fall at home. PD day 1 L total hip. Per Ortho.  ESRD - usual HD is MTuThF (4x/ wk). Last HD Tues. Labs and volume are good. No urgent indication for dialysis today, given that she will be in OR. Will resume w/ HD schedule on Friday.  HTN/ volume - 2-3 kg under dry wt. Needs dry weight adjusted at discharge. Minimal UF.  BP's are high, cont home meds. Probably pain is involved.  Anemia esrd - Hb 9.9 here. Last ESA 6/25, next due 7/09. Follow trends post op  MBD ckd - Ca in range. Phos above goal. OP list has Tums as binder. Follow Ca trend  H/o failed  renal transplant - cont po pred and prograf at low dose H/o LE lymphedema - side effect of prior cancer treatment  Zvi Duplantis H. Oneka Parada NP-C 09/29/2022, 10:38 AM  [employer] [phone]

## 2022-09-29 NOTE — Discharge Instructions (Addendum)
Information on my medicine - ELIQUIS (apixaban)  This medication education was reviewed with me or my healthcare representative as part of my discharge preparation.     Why was Eliquis prescribed for you? Eliquis was prescribed for you to reduce the risk of blood clots forming after orthopedic surgery.    What do You need to know about Eliquis? Take your Eliquis TWICE DAILY - one tablet in the morning and one tablet in the evening with or without food.  It would be best to take the dose about the same time each day.  If you have difficulty swallowing the tablet whole please discuss with your pharmacist how to take the medication safely.  Take Eliquis exactly as prescribed by your doctor and DO NOT stop taking Eliquis without talking to the doctor who prescribed the medication.  Stopping without other medication to take the place of Eliquis may increase your risk of developing a clot.  After discharge, you should have regular check-up appointments with your healthcare provider that is prescribing your Eliquis.  What do you do if you miss a dose? If a dose of ELIQUIS is not taken at the scheduled time, take it as soon as possible on the same day and twice-daily administration should be resumed.  The dose should not be doubled to make up for a missed dose.  Do not take more than one tablet of ELIQUIS at the same time. Important Safety Information A possible side effect of Eliquis is bleeding. You should call your healthcare provider right away if you experience any of the following: Bleeding from an injury or your nose that does not stop. Unusual colored urine (red or dark brown) or unusual colored stools (red or black). Unusual bruising for unknown reasons. A serious fall or if you hit your head (even if there is no bleeding).  Some medicines may interact with Eliquis and might increase your risk of bleeding or clotting while on Eliquis. To help avoid this, consult your  healthcare provider or pharmacist prior to using any new prescription or non-prescription medications, including herbals, vitamins, non-steroidal anti-inflammatory drugs (NSAIDs) and supplements.  This website has more information on Eliquis (apixaban): http://www.eliquis.com/eliquis/home     Diet: As you were doing prior to hospitalization   Shower:  May shower but keep the wounds dry, use an occlusive plastic wrap, NO SOAKING IN TUB.  If the bandage gets wet, change with a clean dry gauze.  If you have a splint on, leave the splint in place and keep the splint dry with a plastic bag.  Dressing:  You can leave the dressing in place as long as there is no significant drainage.  Otherwise change with clean dry gauze.    If you had hand or foot surgery, we will plan to remove your stitches in about 2 weeks in the office.  For all other surgeries, there are sticky tapes (steri-strips) on your wounds and all the stitches are absorbable.  Leave the steri-strips in place when changing your dressings, they will peel off with time, usually 2-3 weeks.  Activity:  Increase activity slowly as tolerated, but follow the weight bearing instructions below.  The rules on driving is that you can not be taking narcotics while you drive, and you must feel in control of the vehicle.    Weight Bearing:   as tolerated.    To prevent constipation: you may use a stool softener such as -  Colace (over the counter) 100 mg by mouth twice  a day  Drink plenty of fluids (prune juice may be helpful) and high fiber foods Miralax (over the counter) for constipation as needed.    Itching:  If you experience itching with your medications, try taking only a single pain pill, or even half a pain pill at a time.  You may take up to 10 pain pills per day, and you can also use benadryl over the counter for itching or also to help with sleep.   Precautions:  If you experience chest pain or shortness of breath - call 911  immediately for transfer to the hospital emergency department!!  If you develop a fever greater that 101 F, purulent drainage from wound, increased redness or drainage from wound, or calf pain -- Call the office at [phone]                                                Follow- Up Appointment:  Please call for an appointment to be seen in 2 weeks [place] - [phone]

## 2022-09-30 DIAGNOSIS — S72002A Fracture of unspecified part of neck of left femur, initial encounter for closed fracture: Secondary | ICD-10-CM | POA: Diagnosis not present

## 2022-09-30 LAB — CBC WITH DIFFERENTIAL/PLATELET
Abs Immature Granulocytes: 0.04 10*3/uL (ref 0.00–0.07)
Basophils Absolute: 0 10*3/uL (ref 0.0–0.1)
Basophils Relative: 0 %
Eosinophils Absolute: 0 10*3/uL (ref 0.0–0.5)
Eosinophils Relative: 0 %
HCT: 24.1 % — ABNORMAL LOW (ref 36.0–46.0)
Hemoglobin: 7.6 g/dL — ABNORMAL LOW (ref 12.0–15.0)
Immature Granulocytes: 1 %
Lymphocytes Relative: 29 %
Lymphs Abs: 2 10*3/uL (ref 0.7–4.0)
MCH: 29.6 pg (ref 26.0–34.0)
MCHC: 31.5 g/dL (ref 30.0–36.0)
MCV: 93.8 fL (ref 80.0–100.0)
Monocytes Absolute: 0.7 10*3/uL (ref 0.1–1.0)
Monocytes Relative: 11 %
Neutro Abs: 4.2 10*3/uL (ref 1.7–7.7)
Neutrophils Relative %: 59 %
Platelets: 163 10*3/uL (ref 150–400)
RBC: 2.57 MIL/uL — ABNORMAL LOW (ref 3.87–5.11)
RDW: 17.3 % — ABNORMAL HIGH (ref 11.5–15.5)
WBC: 7 10*3/uL (ref 4.0–10.5)
nRBC: 0 % (ref 0.0–0.2)

## 2022-09-30 LAB — RENAL FUNCTION PANEL
Albumin: 2.2 g/dL — ABNORMAL LOW (ref 3.5–5.0)
Anion gap: 13 (ref 5–15)
BUN: 34 mg/dL — ABNORMAL HIGH (ref 6–20)
CO2: 25 mmol/L (ref 22–32)
Calcium: 8.1 mg/dL — ABNORMAL LOW (ref 8.9–10.3)
Chloride: 95 mmol/L — ABNORMAL LOW (ref 98–111)
Creatinine, Ser: 4.96 mg/dL — ABNORMAL HIGH (ref 0.44–1.00)
GFR, Estimated: 10 mL/min — ABNORMAL LOW (ref >60)
Glucose, Bld: 131 mg/dL — ABNORMAL HIGH (ref 70–99)
Phosphorus: 5.7 mg/dL — ABNORMAL HIGH (ref 2.5–4.6)
Potassium: 4.4 mmol/L (ref 3.5–5.1)
Sodium: 133 mmol/L — ABNORMAL LOW (ref 135–145)

## 2022-09-30 LAB — HEPATITIS B SURFACE ANTIBODY, QUANTITATIVE: Hep B S AB Quant (Post): <3.5 m[IU]/mL — ABNORMAL LOW (ref >9.9)

## 2022-09-30 MED ORDER — METHOCARBAMOL 500 MG PO TABS: 500.0000 mg | ORAL_TABLET | Freq: Four times a day (QID) | ORAL | 0 refills | Status: DC | PRN

## 2022-09-30 MED ORDER — ENSURE ENLIVE PO LIQD: 237.0000 mL | Freq: Two times a day (BID) | ORAL | 12 refills | Status: DC

## 2022-09-30 MED ORDER — SENNOSIDES-DOCUSATE SODIUM 8.6-50 MG PO TABS: 1.0000 | ORAL_TABLET | Freq: Every day | ORAL | 1 refills | Status: DC

## 2022-09-30 MED ORDER — APIXABAN 2.5 MG PO TABS: 2.5000 mg | ORAL_TABLET | Freq: Two times a day (BID) | ORAL | 1 refills | Status: DC

## 2022-09-30 MED ORDER — OXYCODONE HCL 5 MG PO TABS: 5.0000 mg | ORAL_TABLET | ORAL | 0 refills | Status: DC | PRN

## 2022-09-30 MED ORDER — RENA-VITE PO TABS: 1.0000 | ORAL_TABLET | Freq: Every day | ORAL | 1 refills | Status: AC

## 2022-09-30 NOTE — TOC Transition Note (Addendum)
Transition of Care [facility]) - CM/SW Discharge Note   Patient Details  Name: Christina Mack MRN: [id number] Date of Birth: 05/26/1965  [facility] CM/SW Contact:  Ronny Bacon, RN Phone Number: 09/30/2022, 10:54 AM   Clinical Narrative:   Patient being discharged home today. [facility] confirmed that patient is under their service for PT. Patient confirmed that she has RW at bedside and reports that her PCP is Ali Lowe in [place].  Eliquis 30 day card given to floor nurse to give to patient before discharge. Card explained to patient.    Final next level of care: Home w Home Health Services Barriers to Discharge: No Barriers Identified   Patient Goals and CMS Choice CMS Medicare.gov Compare Post Acute Care list provided to:: Patient Choice offered to / list presented to : Patient  Discharge Placement                         Discharge Plan and Services Additional resources added to the After Visit Summary for   In-house Referral: NA Discharge Planning Services: CM Consult [facility] referral) Post Acute Care Choice: Home Health          DME Arranged: Walker rolling DME Agency: [employer] Date DME Agency Contacted: 09/29/22 Time DME Agency Contacted: 1121 Representative spoke with at DME Agency: Vaughan Basta HH Arranged: PT HH Agency: [facility] (Requested - Has used in the past) Date HH Agency Contacted: 09/29/22 Time HH Agency Contacted: 1142 Representative spoke with at [facility] Agency: Kandee Keen  Social Determinants of Health (SDOH) Interventions SDOH Screenings   Food Insecurity: No Food Insecurity (09/27/2022)  Housing: Low Risk  (09/27/2022)  Transportation Needs: No Transportation Needs (09/27/2022)  Utilities: Not At Risk (09/27/2022)  Depression (PHQ2-9): Low Risk  (05/23/2022)  Tobacco Use: Low Risk  (09/29/2022)     Readmission Risk Interventions    07/26/2022    4:11 PM 03/24/2022   11:54 AM  Readmission Risk Prevention Plan   Transportation Screening Complete Complete  Medication Review (RN Care Manager) Referral to Pharmacy Complete  PCP or Specialist appointment within 3-5 days of discharge Complete Complete  HRI or Home Care Consult Complete Complete  SW Recovery Care/Counseling Consult Complete Complete  Palliative Care Screening Not Applicable Not Applicable  Skilled Nursing Facility Not Applicable Not Applicable

## 2022-09-30 NOTE — Progress Notes (Signed)
[facility] Progress Note   Subjective: Seen in room. Hyperkalemia yesterday. Questioned about diet. Said she ate fruit. K+ 4.4 today. No C/Os.   Objective Vitals:   09/29/22 2159 09/29/22 2321 09/30/22 0400 09/30/22 0700  BP:  134/89 134/77 (!) 150/88  Pulse:  92 80   Resp:  19 16   Temp: 98.7 F (37.1 C) 98.4 F (36.9 C) 98.5 F (36.9 C) 98.6 F (37 C)  TempSrc: Oral Oral Oral Oral  SpO2:  97% 97%   Weight:      Height:       Physical Exam General: Pleasant female in NAD Heart: S1, S2 no M/R/G. SR on monitor.  Lungs: CTAB decreased in bases Abdomen: NABS, NT Extremities: Trace BLE edema Dialysis Access: RIJ TDC drsg intact  Additional Objective Labs: Basic Metabolic Panel: Recent Labs  Lab 09/28/22 0534 09/29/22 0527 09/30/22 0307  NA 133* 134* 133*  K 4.9 6.1* 4.4  CL 98 98 95*  CO2 22 19* 25  GLUCOSE 97 159* 131*  BUN 42* 63* 34*  CREATININE 7.08* 8.66* 4.96*  CALCIUM 9.0 8.5* 8.1*  PHOS 7.0* 9.2* 5.7*   Liver Function Tests: Recent Labs  Lab 09/28/22 0534 09/29/22 0527 09/30/22 0307  ALBUMIN 2.6* 2.3* 2.2*   No results for input(s): "LIPASE", "AMYLASE" in the last 168 hours. CBC: Recent Labs  Lab 09/27/22 0819 09/28/22 0534 09/29/22 0527 09/30/22 0307  WBC 7.3 7.6 6.3 7.0  NEUTROABS 5.0  --   --  4.2  HGB 9.9* 9.8* 8.1* 7.6*  HCT 32.9* 32.7* 26.2* 24.1*  MCV 94.8 94.2 91.9 93.8  PLT 152 176 166 163   Blood Culture    Component Value Date/Time   SDES URINE, CLEAN CATCH 07/22/2022 1859   SPECREQUEST  07/22/2022 1859    NONE Performed at [facility], [address] [place], [place] [address]    CULT 10,000 COLONIES/mL PSEUDOMONAS AERUGINOSA (A) 07/22/2022 1859   REPTSTATUS 07/25/2022 FINAL 07/22/2022 1859    Cardiac Enzymes: No results for input(s): "CKTOTAL", "CKMB", "CKMBINDEX", "TROPONINI" in the last 168 hours. CBG: No results for input(s): "GLUCAP" in the last 168 hours. Iron Studies: No results for  input(s): "IRON", "TIBC", "TRANSFERRIN", "FERRITIN" in the last 72 hours. @lablastinr3 @ Studies/Results: DG Hip Port Unilat With Pelvis 1V Left  Result Date: 09/28/2022 CLINICAL DATA:  Status post left hip arthroplasty. EXAM: DG HIP (WITH OR WITHOUT PELVIS) 1V PORT LEFT COMPARISON:  Preoperative radiograph FINDINGS: Left hip arthroplasty in expected alignment. No periprosthetic lucency or fracture. Recent postsurgical change includes air and edema in the soft tissues. Previous right hip arthroplasty. Chronic widening of the pubic symphysis. IMPRESSION: Left hip arthroplasty without immediate postoperative complication. Electronically Signed   By: Narda Rutherford M.D.   On: 09/28/2022 17:50   Medications:  methocarbamol (ROBAXIN) IV      apixaban  2.5 mg Oral Q12H   calcium carbonate  2 tablet Oral TID WC   carvedilol  25 mg Oral BID WC   Chlorhexidine Gluconate Cloth  6 each Topical Q0600   docusate sodium  100 mg Oral BID   feeding supplement  237 mL Oral BID BM   multivitamin  1 tablet Oral QHS   pantoprazole  40 mg Oral BID AC   predniSONE  5 mg Oral Q breakfast   senna-docusate  1 tablet Oral QHS   tacrolimus  1 mg Oral BID     OP HD:  TCU MTuThF 3h  400/800  64kg  2/2 bath  TDC   Heparin 5000 - last OP HD 6/25, post wt 64kg   - rocaltrol 0.5 mcg po three times per week - mircera 225 mcg IV q 2 wks, last 6/25, due 7/09     Assessment/ Plan: L hip fracture - after a fall at home. PD day 1 L total hip. Per Ortho.  ESRD - usual HD is MTuThF (4x/ wk). Last HD Tues. Next HD 10/02/2022 on TCU schedule.  HTN/ volume - 2-3 kg under dry wt. Needs dry weight adjusted at discharge. Minimal UF.  BP's are high, cont home meds. Probably pain is involved.  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 Transfuse if less than 7.0. Last ESA 6/25, next due 7/09. Check iron panel with HD 10/02/2022. Follow labs.  MBD ckd - Ca in range. Phos close to goal. OP list has Tums as binder.  Follow Ca trend  H/o failed renal transplant - cont po pred and prograf at low dose H/o LE lymphedema - side effect of prior cancer treatment  Darrel Baroni H. Ladainian Therien NP-C 09/30/2022, 10:50 AM  [employer] [phone]

## 2022-09-30 NOTE — Progress Notes (Signed)
Physical Therapy Treatment Patient Details Name: Christina Mack MRN: [id number] DOB: Feb 12, 1966 Today's Date: 09/30/2022   History of Present Illness 57 y/o F admitted to [facility] on 6/26 for after a fall, found to have a L closed hip fracture. L THR 6/27, now WBAT. PMHx: renal transplant on HD, vulvar CA s/p resection/radiation, RLE DVT, RLE lymphedema    PT Comments    Pt tolerated today's session well, able to ambulate in the hallway. Pt able to self manage LLE on/off the bed without physical assist from therapist, continuing to require increased time due to pain. Pt tolerated ambulation well, her RW was delivered to her room, adjusted to her proper height with caps added on the legs for smooth gliding on the floors. Pt declined any attempts at stairs today, discussed with pt proper technique to negotiate and she reports feeling comfortable with assist from her husband. Also discussed car transfers and LLE management, pt works in therapy and reports feeling comfortable. If pt remains admitted, acute PT will continue to follow up with her, continue to recommend HHPT upon discharge.     Recommendations for follow up therapy are one component of a multi-disciplinary discharge planning process, led by the attending physician.  Recommendations may be updated based on patient status, additional functional criteria and insurance authorization.  Follow Up Recommendations       Assistance Recommended at Discharge Frequent or constant Supervision/Assistance  Patient can return home with the following A little help with walking and/or transfers;Assistance with cooking/housework;Assist for transportation;Help with stairs or ramp for entrance   Equipment Recommendations  Rolling walker (2 wheels)    Recommendations for Other Services       Precautions / Restrictions Precautions Precautions: Fall Restrictions Weight Bearing Restrictions: Yes LLE Weight Bearing: Weight bearing as tolerated      Mobility  Bed Mobility Overal bed mobility: Needs Assistance Bed Mobility: Supine to Sit, Sit to Supine     Supine to sit: HOB elevated, Supervision Sit to supine: HOB elevated, Supervision   General bed mobility comments: increased time to perform bed mobility, pt able to complete utilizing BUE to assist with LLE on/off the bed and rest breaks due to pain, bed rail utilized and pt reports her bed has railings    Transfers Overall transfer level: Needs assistance Equipment used: Rolling walker (2 wheels) Transfers: Sit to/from Stand Sit to Stand: From elevated surface, Min guard           General transfer comment: minG for safety, increased time for pt to find a comfortable hand placement, able to complete quicker second trial from mildly elevated bed    Ambulation/Gait Ambulation/Gait assistance: Supervision Gait Distance (Feet): 75 Feet Assistive device: Rolling walker (2 wheels) Gait Pattern/deviations: Step-to pattern, Decreased dorsiflexion - left, Antalgic, Trunk flexed Gait velocity: decreased     General Gait Details: antalgic gait on LLE, downward gaze. Increased time to complete 180 degree turns, with brief pauses due to pain, supervision for safety and line management   Stairs             Wheelchair Mobility    Modified Rankin (Stroke Patients Only)       Balance Overall balance assessment: Needs assistance Sitting-balance support: No upper extremity supported, Feet supported Sitting balance-Leahy Scale: Good     Standing balance support: Bilateral upper extremity supported, During functional activity, Reliant on assistive device for balance Standing balance-Leahy Scale: Poor Standing balance comment: reliant on RW  Cognition Arousal/Alertness: Awake/alert Behavior During Therapy: WFL for tasks assessed/performed Overall Cognitive Status: Within Functional Limits for tasks assessed                                           Exercises      General Comments General comments (skin integrity, edema, etc.): VSS on room air      Pertinent Vitals/Pain Pain Assessment Pain Assessment: Faces Faces Pain Scale: Hurts little more Pain Location: L hip Pain Descriptors / Indicators: Aching, Burning, Grimacing Pain Intervention(s): Limited activity within patient's tolerance, Monitored during session, Repositioned, Premedicated before session    Home Living                          Prior Function            PT Goals (current goals can now be found in the care plan section) Acute Rehab PT Goals Patient Stated Goal: go home PT Goal Formulation: With patient Time For Goal Achievement: 10/13/22 Potential to Achieve Goals: Good Progress towards PT goals: Progressing toward goals    Frequency    Min 5X/week      PT Plan Current plan remains appropriate    Co-evaluation              AM-PAC PT "6 Clicks" Mobility   Outcome Measure  Help needed turning from your back to your side while in a flat bed without using bedrails?: A Little Help needed moving from lying on your back to sitting on the side of a flat bed without using bedrails?: A Little Help needed moving to and from a bed to a chair (including a wheelchair)?: A Little Help needed standing up from a chair using your arms (e.g., wheelchair or bedside chair)?: A Little Help needed to walk in hospital room?: A Little Help needed climbing 3-5 steps with a railing? : A Lot 6 Click Score: 17    End of Session Equipment Utilized During Treatment: Gait belt Activity Tolerance: Patient tolerated treatment well Patient left: in bed;with call bell/phone within reach Nurse Communication: Mobility status PT Visit Diagnosis: Muscle weakness (generalized) (M62.81);Difficulty in walking, not elsewhere classified (R26.2);Other abnormalities of gait and mobility (R26.89);Pain Pain - Right/Left:  Left Pain - part of body: Hip     Time: 0926-0949 PT Time Calculation (min) (ACUTE ONLY): 23 min  Charges:  $Gait Training: 8-22 mins $Therapeutic Activity: 8-22 mins                     Lindalou Hose, PT DPT Acute Rehabilitation Services Office [phone]    Leonie Man 09/30/2022, 1:42 PM

## 2022-09-30 NOTE — Discharge Summary (Signed)
Physician Discharge Summary   Patient: Christina Mack MRN: [id number] DOB: 1965-08-29  Admit date:     09/27/2022  Discharge date: 09/30/22  Discharge Physician: Jonah Blue   PCP: System, Provider Not In   Recommendations at discharge:   Take Tylenol, Robaxin, Oxycodone as needed for pain Follow up with Dr. 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  She was found to have a closed L hip fracture and underwent L THR on 6/27.    Assessment and Plan:  L closed hip fracture -Apparently mechanical fall resulting in hip fracture -Orthopedics consulted, s/p L THR by Dr. 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 -Chronic right leg lymphedema -Chronic R pubic wound with chronic osteomyelitis; will need "optimization of medical condition and nutrition prior to embarking on any surgical resection and flap closure" -Wound care consulted for pubic wound care - continue to keep clean and apply Silver Hydrofiber to the wound bed daily   Anemia of chronic kidney disease -Hgb is decreased post-operatively, as expected -Will need f/u CBC at her next HD session   Chronic osteomyelitis  -Noted on previous MRI from 07/23/2022 of the right inferior pubic ramus -Followed at [facility]: Chronic R pubic wound with chronic osteomyelitis; will need "optimization of medical condition and nutrition prior to embarking on any surgical resection and flap closure" -Wound care consult for pubic wound care  DVT -Continue Eliquis   GERD -Continue Protonix twice daily    Pain control - [place] Controlled Substance Reporting System database was reviewed. and patient was instructed, not to drive, operate heavy machinery, perform activities at heights, swimming or participation in water activities or provide baby-sitting services while on Pain, Sleep and Anxiety Medications; until their outpatient Physician has advised to do so again. Also recommended to not to take more than prescribed Pain, Sleep and Anxiety Medications.   Consultants: Orthopedics, Nephrology; wound care; PT/OT; TOC team Procedures performed: L total hip replacement, hemodialysis  Disposition: Home Diet recommendation:  Renal diet DISCHARGE MEDICATION: Allergies as of 09/30/2022  Reactions   Oxycodone-acetaminophen Itching, Nausea And Vomiting, Nausea Only, Rash, Other (See Comments)   Headaches, also   Zosyn [piperacillin  Sod-tazobactam So] Other (See Comments)   Thrombocytopenia   Hydromorphone Nausea And Vomiting, Rash, Other (See Comments)   Amlodipine Other (See Comments), Swelling   Ankle swelling   Codeine Other (See Comments)   vomiting   Hydralazine    Headache   Zyvox [linezolid] Other (See Comments)   Decreased platelet counts        Medication List     STOP taking these medications    cefadroxil 500 MG capsule Commonly known as: DURICEF   isosorbide mononitrate 30 MG 24 hr tablet Commonly known as: IMDUR   lisinopril 40 MG tablet Commonly known as: ZESTRIL       TAKE these medications    apixaban 2.5 MG Tabs tablet Commonly known as: ELIQUIS Take 1 tablet (2.5 mg total) by mouth every 12 (twelve) hours.   carvedilol 12.5 MG tablet Commonly known as: COREG Take 2 tablets (25 mg total) by mouth 2 (two) times daily with a meal. What changed:  when to take this additional instructions   feeding supplement Liqd Take 237 mLs by mouth 2 (two) times daily between meals.   methocarbamol 500 MG tablet Commonly known as: ROBAXIN Take 1 tablet (500 mg total) by mouth every 6 (six) hours as needed for muscle spasms.   multivitamin Tabs tablet Take 1 tablet by mouth at bedtime.   oxyCODONE 5 MG immediate release tablet Commonly known as: Oxy IR/ROXICODONE Take 1-2 tablets (5-10 mg total) by mouth every 4 (four) hours as needed for moderate pain (pain score 4-6).   pantoprazole 40 MG tablet Commonly known as: PROTONIX TAKE 1 TABLET (40 MG TOTAL) BY MOUTH 2 (TWO) TIMES DAILY BEFORE A MEAL. What changed: how much to take   predniSONE 5 MG tablet Commonly known as: DELTASONE Take 5 mg by mouth daily with breakfast.   Prograf 1 MG capsule Generic drug: tacrolimus Take 1 mg by mouth 2 (two) times daily.   senna-docusate 8.6-50 MG tablet Commonly known as: Senokot-S Take 1 tablet by mouth at bedtime.   TUMS PO Take 2 tablets by mouth as needed (when eating dairy).                Discharge Care Instructions  (From admission, onward)           Start     Ordered   09/30/22 0000  Discharge wound care:       Comments: Clean R groin wound with NS, apply a strip of Silver Hydrofiber to wound bed daily. Secure with peripad and mesh panties.   09/30/22 1049   09/29/22 0000  Weight bearing as tolerated        09/29/22 1732            Follow-up Information     [facility] Follow up.   Specialty: Home Health Services Why: Frances Furbish will contact you within 48 hours of DC to schedule Home Visit Contact information: [address] [place] [address] [phone]         [facility] Follow up.   Why: [facility] will provide your rolling walker Contact information: [address]  [place]  [address]  [phone]        Teryl Lucy, MD. Schedule an appointment as soon as possible for a visit in 2 week(s).   Specialty: Orthopedic Surgery Contact information: [address] [place] [address] [phone]                Discharge Exam: Ceasar Mons Weights   09/27/22 0716 09/28/22 1125 09/29/22 1811  Weight: 61.2 kg 61.2 kg 57.3 kg   Subjective: She has good pain control at rest, limited mobility due to pain with movement.  She worked with PT and feels ready for dc today.   Physical Exam:       Vitals:    09/29/22 2159 09/29/22 2321 09/30/22 0400 09/30/22 0700  BP:   134/89 134/77 (!) 150/88  Pulse:   92 80    Resp:   19 16    Temp: 98.7 F (37.1 C) 98.4 F (36.9 C) 98.5 F (36.9 C) 98.6 F (37 C)  TempSrc: Oral Oral Oral Oral  SpO2:   97% 97%    Weight:          Height:            General:  Appears calm and comfortable and is in NAD Eyes:  EOMI, normal lids, iris ENT:  grossly normal hearing, lips & tongue, mmm Neck:  no LAD, masses or thyromegaly Cardiovascular:  RRR, no m/r/g. No LE edema.  Respiratory:   CTA bilaterally with no wheezes/rales/rhonchi.   Normal respiratory effort. Abdomen:  soft, NT, ND Skin:  no rash or induration seen on limited exam, L hip bandage is C/D/I Musculoskeletal:  limited L hip ROM s/p operative repair Psychiatric:  blunted mood and affect, speech fluent and appropriate, AOx3 Neurologic:  CN 2-12 grossly intact, moves all extremities in coordinated fashion     Pertinent labs:     Glucose 131 BUN 34/Creatinine 4.96/GFR 10 Albumin 2.2 WBC 7 Hgb 7.6, 8.1 on 6/28  Condition at discharge: improving  The results of significant diagnostics from this hospitalization (including imaging, microbiology, ancillary and laboratory) are listed below for reference.   Imaging Studies: DG Hip Port Unilat With Pelvis 1V Left  Result Date: 09/28/2022 CLINICAL DATA:  Status post left hip arthroplasty. EXAM: DG HIP (WITH OR WITHOUT PELVIS) 1V PORT LEFT COMPARISON:  Preoperative radiograph FINDINGS: Left hip arthroplasty in expected alignment. No periprosthetic lucency or fracture. Recent postsurgical change includes air and edema in the soft tissues. Previous right hip arthroplasty. Chronic widening of the pubic symphysis. IMPRESSION: Left hip arthroplasty without immediate postoperative complication. Electronically Signed   By: Narda Rutherford M.D.   On: 09/28/2022 17:50   DG Chest 1 View  Result Date: 09/27/2022 CLINICAL DATA:  Larey Seat this morning.  Severe LEFT leg pain. EXAM: CHEST  1 VIEW COMPARISON:  07/21/2022 FINDINGS: RIGHT-sided dialysis catheter tip overlies the UPPER RIGHT atrium. Heart is normal in size. There are no focal consolidations or pleural effusions. No pulmonary edema. No acute displaced rib fractures. No pneumothorax. IMPRESSION: No evidence for acute abnormality. Electronically Signed   By: Norva Pavlov M.D.   On: 09/27/2022 08:26   DG Hip Unilat With Pelvis 2-3 Views Left  Result Date: 09/27/2022 CLINICAL DATA:  Fall this morning.  Severe LEFT leg pain. EXAM: DG HIP (WITH OR WITHOUT PELVIS) 2-3V LEFT  COMPARISON:  04/13/2022 FINDINGS: Positioning is nonstandard secondary due to patient position and pain. There is an acute fracture of the LEFT femoral neck with minimal displacement. RIGHT hip arthroplasty. Chronic fracture of the RIGHT superior pubic ramus. Irregularity of the symphysis pubis and RIGHT ischium are probably chronic but warrant further characterization with better position or CT exam if needed.  There are significant atherosclerotic calcifications of the pelvic vessels. IMPRESSION: 1. Acute fracture of the LEFT femoral neck. 2. Chronic fracture of the RIGHT superior pubic ramus. 3. Irregularity of the symphysis pubis and RIGHT ischium are probably chronic but warrant further characterization with better position or CT exam if needed. Electronically Signed   By: Norva Pavlov M.D.   On: 09/27/2022 08:25    Microbiology: Results for orders placed or performed during the hospital encounter of 09/27/22  Surgical pcr screen     Status: None   Collection Time: 09/28/22 11:48 AM   Specimen: Nasal Mucosa; Nasal Swab  Result Value Ref Range Status   MRSA, PCR NEGATIVE NEGATIVE Final   Staphylococcus aureus NEGATIVE NEGATIVE Final    Comment: (NOTE) The Xpert SA Assay (FDA approved for NASAL specimens in patients 48 years of age and older), is one component of a comprehensive surveillance program. It is not intended to diagnose infection nor to guide or monitor treatment. Performed at [facility], [address], [place], [place] [address]       Discharge time spent: greater than 30 minutes.  Signed: Jonah Blue, MD [facility] 09/30/2022

## 2022-10-01 NOTE — Discharge Planning (Signed)
[facility] Patient Discharge Orders- [facility] CLINIC: [facility]  Patient's name: Christina Mack Admit/DC Dates: 09/27/2022 - 09/30/2022  Discharge Diagnoses: L Hip Fx S/P THR Dr. Dion Saucier 09/28/2022    Aranesp: Given: No   Date and amount of last dose: NA  Last Hgb: 7.6 PRBC's Given: No Date/# of units: NA ESA dose for discharge: mircera 225 mcg IV q 2 weeks  IV Iron dose at discharge: Per protocol  Heparin change: Decrease Heparin to 2500 units IV week of 10/02/2022 then resume previous dose week of 10/08/2022  EDW Change: Yes New EDW: 57.5  [place] Change: No  Access intervention/Change: No Details: NA  Hectorol/Calcitriol change: No  Discharge Labs: Calcium 8.1 Corrected Ca+ 9.5 Phosphorus 5.7 Albumin 2.2 K+ 4.4  IV Antibiotics: NA Details:  On Coumadin?: NA Last INR: Next INR: Managed By:   OTHER/APPTS/LAB ORDERS: Order HGB 10/02/2022 Dietician to see pt-Low albumin, needs binder other than TUMS-corrected Ca+ climbing.     D/C Meds to be reconciled by nurse after every discharge.  Completed By: Alonna Buckler [facility] [phone]   Reviewed by: MD:______ RN_______

## 2022-10-02 ENCOUNTER — Telehealth (HOSPITAL_COMMUNITY): Payer: Self-pay | Admitting: Nephrology

## 2022-10-02 NOTE — Anesthesia Postprocedure Evaluation (Signed)
Anesthesia Post Note  Patient: Christina Mack  Procedure(s) Performed: TOTAL HIP POSTERIOR (Left: Hip)     Patient location during evaluation: PACU Anesthesia Type: General Level of consciousness: awake and alert Pain management: pain level controlled Vital Signs Assessment: post-procedure vital signs reviewed and stable Respiratory status: spontaneous breathing, nonlabored ventilation, respiratory function stable and patient connected to nasal cannula oxygen Cardiovascular status: blood pressure returned to baseline and stable Postop Assessment: no apparent nausea or vomiting Anesthetic complications: no   No notable events documented.  Last Vitals:  Vitals:   09/30/22 0400 09/30/22 0700  BP: 134/77 (!) 150/88  Pulse: 80   Resp: 16   Temp: 36.9 C 37 C  SpO2: 97%     Last Pain:  Vitals:   09/30/22 0835  TempSrc:   PainSc: 5                  [name]

## 2022-10-02 NOTE — Progress Notes (Signed)
Late Note Entry  Pt was d/c to home on Saturday. Contacted TCU RN this morning who states that pt is receiving treatment this morning on regular schedule.   Olivia Canter Renal Navigator [phone]

## 2022-10-02 NOTE — Telephone Encounter (Signed)
Transition of Care - Initial Contact from Inpatient Facility  Date of discharge: 09/30/22 Date of contact: 10/02/22  Method: Phone Spoke to: Patient  Patient contacted to discuss transition of care from recent inpatient hospitalization. Patient was admitted to [facility] from 6/26 - 09/30/22 with discharge diagnosis of closed L hip fracture s/p fall, s/p L THR 6/27.  The discharge medication list was reviewed. Patient understands the changes and has no concerns.   S/p HD this morning without issues - getting around reasonably well.  No other concerns at this time.  Ozzie Hoyle, PA-C [employer] Pager [phone]

## 2022-10-11 ENCOUNTER — Encounter: Payer: Self-pay | Admitting: Gynecologic Oncology

## 2022-10-12 ENCOUNTER — Inpatient Hospital Stay: Payer: Medicare Other | Attending: Gynecologic Oncology | Admitting: Gynecologic Oncology

## 2022-10-12 ENCOUNTER — Other Ambulatory Visit: Payer: Self-pay

## 2022-10-12 ENCOUNTER — Encounter: Payer: Self-pay | Admitting: Gynecologic Oncology

## 2022-10-12 VITALS — BP 191/100 | HR 95 | Temp 98.9°F | Resp 17 | Ht 65.0 in | Wt 129.4 lb

## 2022-10-12 DIAGNOSIS — N186 End stage renal disease: Secondary | ICD-10-CM | POA: Diagnosis not present

## 2022-10-12 DIAGNOSIS — R600 Localized edema: Secondary | ICD-10-CM | POA: Insufficient documentation

## 2022-10-12 DIAGNOSIS — I890 Lymphedema, not elsewhere classified: Secondary | ICD-10-CM

## 2022-10-12 DIAGNOSIS — Z9221 Personal history of antineoplastic chemotherapy: Secondary | ICD-10-CM | POA: Diagnosis not present

## 2022-10-12 DIAGNOSIS — Z7682 Awaiting organ transplant status: Secondary | ICD-10-CM | POA: Diagnosis not present

## 2022-10-12 DIAGNOSIS — Z923 Personal history of irradiation: Secondary | ICD-10-CM | POA: Insufficient documentation

## 2022-10-12 DIAGNOSIS — S31109S Unspecified open wound of abdominal wall, unspecified quadrant without penetration into peritoneal cavity, sequela: Secondary | ICD-10-CM

## 2022-10-12 DIAGNOSIS — C519 Malignant neoplasm of vulva, unspecified: Secondary | ICD-10-CM

## 2022-10-12 DIAGNOSIS — Z8544 Personal history of malignant neoplasm of other female genital organs: Secondary | ICD-10-CM | POA: Insufficient documentation

## 2022-10-12 DIAGNOSIS — Z8739 Personal history of other diseases of the musculoskeletal system and connective tissue: Secondary | ICD-10-CM

## 2022-10-12 DIAGNOSIS — Z992 Dependence on renal dialysis: Secondary | ICD-10-CM | POA: Insufficient documentation

## 2022-10-12 NOTE — Progress Notes (Signed)
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 Earlene Plater)  Initial exam by Dr. Earlene Plater on 09/08/19: right vulva was completely involved by a large necrotic mass extending from the inguinal crease towards the anus measuring 12x5cm. It involved the distal vagina and extended to involve the left labia as well. It was difficult to evaluate her vaginal involvement due to pain.   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(HCC)  09/10/2019 Initial Biopsy   Vulvar biopsy: at least VIN3, biopsy demonstrates extreme ulceration, inflammation and granulation tissue. Underlying invasion very difficult to rule out.   09/16/2019 Imaging   PET: IMPRESSION:  1.  Large hypermetabolic mass in the right side of the perineum, consistent with known vulvar malignancy.  2.  Hypermetabolic right groin lymphadenopathy is consistent with nodal involvement.  3.  Tiny right apical pulmonary nodule is below PET resolution.    09/16/2019 Initial Diagnosis   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(HCC)   11/03/2019 - 01/15/2020 Radiation Therapy   IMRT. She received 45GY/25 fractions to the pelvic and inguinal nodes and vulvar mass. The vulvar mass and enlarged inguinal nodes received a boost dose of 1800cGY/10 fractions for total dose of 63Gy/35 fractions.  Treated in [place]. Diagnosed with DVT several weeks into treatment. Missed treatments due to feeling poorly. Received 4 Cisplatin (stopped due to elevated Cr) - 8/5, 8/11, 8/18, 8/25   04/03/2020 Imaging   CT A/P w/o contrast: 1.  Redemonstration of the large right perineal mass, with new extensive surrounding soft tissue stranding and  gas which extends along the bilateral anterior pubic bones and anterior pelvis.  I'm unable to exclude developing necrotizing fasciitis.  No definite drainable abscess collection is identified.  2.  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  3.  Diffuse edema of the pelvic mesentery with mild presacral fluid, concerning for mesenteritis.  No definite drainable intraperitoneal fluid collection identified.  4.  Suspected bowel ileus or gastroenteritis.  5.  Moderately distended gallbladder with heterogeneous dependent material suggestive of sludge and possible gallstones.  6.  Hepatosplenomegaly.  7.  Suspected anemia.  8.  Moderate diffuse urinary bladder wall thickening suggesting chronic cystitis.    04/20/2020 - 05/05/2020 Hospital Admission   Admitted in the setting of right groin wound. Had been admitted to [facility] 1/2 with concerns for soft tissue infection. Started on vanc/zosyn --> oral levo/flagyl to complete 3 weeks of treatment ( EOT 1/22). Pelvic MRI at [facility] showed multilocular abscess of left pelvis, myositis, nondisplaced fracture of inferior right pubic ramus, concern for osteomytlitis. IR drainage of abscess was performed - VRE on culture. ID recommended IV dapto --> tedezolid oupatient (as well as levo + flagyl) for 6 weeks total.    04/26/2020 Imaging   MRI pelvis w/o contrast: IMPRESSION: 1. Large area of ulceration with sinus tract seen within the right labial fold with diffuse surrounding phlegmon and extensive myositis in the bilateral adductor and anterior muscular compartments of both thighs. 2. There is a multilocular fluid collection extending in the inferior left pelvic soft tissues surrounding the pubic rami and extending to the anterior upper left thigh, consistent with a multilocular abscess. 3. Nondisplaced fracture of the inferior  right pubic rami with findings suggestive osteomyelitis involving the pubic rami and anterior pubic  symphysis. 4. Findings suggestive of osteomyelitis involving the anterior left pubic symphysis.   05/05/2020 - 05/17/2020 Hospital Admission   Admitted for inpatient rehab given debility.   07/27/2020 Imaging   PET: Significant decrease in size and avidity of vulvar lesion. Air and soft tissue inflammation. Mild PET hypermetabolic activity. Resolution of inguinal LN activity. 9mm LU love groundglass nodule, mild hypermetabolic activity.   09/14/2020 Imaging   CT Chest w/o contrast: IMPRESSION:  1.  Resolution of left upper lobe nodule and associated groundglass opacity.  2.  Stable 5 mm nodular opacity right upper lobe.  3.  Stable 3 mm nodule right lower lobe.    07/20/2021 Imaging   PET: No findings suspicious for recurrent vulvar carcinoma or metastatic disease. 2. Persistent open wound along the right perineum extending down to the bone. There are progressive changes of probable osteomyelitis involving the right inferior pubic ramus with pathologic fractures. 3. Interval improved appearance of the pubic symphysis and adjacent pubic bones.   03/09/2022 Imaging   PET: 1. No signs of distant metastatic disease. 2. Progressive changes of osteomyelitis and signs of pubic bone fracture on the RIGHT. 3. Increasing metabolic activity of the RIGHT groin could relate to worsening inflammation in this area, focal nature does not allow for exclusion of metastatic process. Would suggest focused ultrasound with sampling as warranted. 4. No gross focal uptake to suggest recurrence in the site of resection though there is peripheral uptake that is indeterminate. 5. Gross edema throughout the lower extremity on the RIGHT with expansion of the upper RIGHT thigh. Correlate with any signs of impaired venous drainage. Given the marked asymmetry now nearly 6 weeks following arthroplasty would suggest RIGHT lower extremity venous Doppler to exclude DVT. 6. Anal canal uptake potentially physiologic.  Given patient history and proximity to tumor considered correlation with physical exam. 7. Post RIGHT lower quadrant renal transplant. Amount of uptake and or excretion of FDG by the RIGHT kidney is perhaps less than expected though is not well assessed. Correlate with renal function. 8. Aortic atherosclerosis and coronary artery disease. 9. Post RIGHT hip arthroplasty with increased metabolic activity potentially related to recent surgery. It would be difficult to exclude the possibility of secondary infection of this area in the setting of chronic open wound and worsening sequela of osteomyelitis.   03/20/2022 Imaging   CT A/P: IMPRESSION: 1. Asymmetric soft tissue thickening involving the upper RIGHT perineum, incompletely imaged, with chronic open soft tissue wound underlying the RIGHT inferior pubic ramus. Associated destructive/lytic changes of the RIGHT inferior pubic ramus (discontinuity of the mid/posterior portion of the RIGHT inferior pubic ramus), corresponding to patient's known pathologic process in this area. However, there is increased destruction/discontinuity of the RIGHT inferior pubic ramus compared to the earlier PET-CT of 07/20/2021 indicating some degree of progression. 2. Chronic pathologic-appearing fracture deformity at the lateral aspect of the RIGHT superior pubic ramus/anterior acetabulum. The adjacent RIGHT hip prosthesis appears intact and stable in position. 3. 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 Additional hydronephrosis of the somewhat atrophic-appearing transplant kidney in the LEFT pelvis. Bladder is distended, suggesting associated bladder outlet obstruction.   03/21/2022 Imaging   Abdominal pelvic ultrasound: Sonographic evaluation of the area of concern in the right labia demonstrates soft tissue thickening similar to prior CT scans dating back to 04/03/2020. This may be due to  chronic inflammation  or postsurgical change. No discrete fluid collection identified to indicate abscess.     Interval History: She was recently admitted after a fall, found to have a closed L hip fracture requiring total hip replacement surgery.   She saw Rexene Alberts in early May after hospitalization for acute on chronic renal failure requiring hemodialysis.   The patient has now seen plastic surgery, Ortho oncology, and GYN oncology at [facility] with tentative plan for resection of her ischial ramus with reconstruction.  Sounds like things were on hold until the patient was cleared by her nephrologist for surgery.  The patient overall notes doing well.  Is working on physical therapy after recent surgery.  Notes less vulvar/vaginal drainage since her fall, is unsure how much of this is urine.  Only has bleeding if someone touches her open wound.  She endorses normal bowel function.  She is currently going to dialysis Monday, Tuesday, Thursday, Friday.  Plan is for her to be on dialysis until she undergoes definitive treatment for her osteomyelitis with resection and is able to pursue renal transplant.  She endorses persistent right lower extremity swelling, unchanged since her hospitalization in January.  Past Medical/Surgical History: Past Medical History:  Diagnosis Date   Complication of anesthesia    nausea and vomiting   Dialysis patient (HCC)    Currenlty back on dialysis   DVT (deep venous thrombosis) (HCC)    History of osteomyelitis    Kidney transplant failure    Kidney transplant recipient    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[facility])     Past Surgical History:  Procedure Laterality Date   APPLICATION OF WOUND VAC Right 01/22/2022   Procedure: APPLICATION OF WOUND VAC;  Surgeon: Tarry Kos, MD;  Location: [facility] OR;  Service: Orthopedics;  Laterality: Right;   BIOPSY  04/24/2020   Procedure: BIOPSY;  Surgeon: Sherrilyn Rist, MD;  Location: [facility] ENDOSCOPY;  Service: Gastroenterology;;    BIOPSY  05/27/2022   Procedure: BIOPSY;  Surgeon: Tressia Danas, MD;  Location: [facility] ENDOSCOPY;  Service: Gastroenterology;;   COLONOSCOPY WITH PROPOFOL N/A 05/27/2022   Procedure: COLONOSCOPY WITH PROPOFOL;  Surgeon: Tressia Danas, MD;  Location: [facility] ENDOSCOPY;  Service: Gastroenterology;  Laterality: N/A;   ESOPHAGOGASTRODUODENOSCOPY (EGD) WITH PROPOFOL N/A 04/24/2020   Procedure: ESOPHAGOGASTRODUODENOSCOPY (EGD) WITH PROPOFOL;  Surgeon: Sherrilyn Rist, MD;  Location: [facility] ENDOSCOPY;  Service: Gastroenterology;  Laterality: N/A;   ESOPHAGOGASTRODUODENOSCOPY (EGD) WITH PROPOFOL N/A 05/27/2022   Procedure: ESOPHAGOGASTRODUODENOSCOPY (EGD) WITH PROPOFOL;  Surgeon: Tressia Danas, MD;  Location: [facility] ENDOSCOPY;  Service: Gastroenterology;  Laterality: N/A;   GIVENS CAPSULE STUDY N/A 05/28/2022   Procedure: GIVENS CAPSULE STUDY;  Surgeon: Tressia Danas, MD;  Location: [facility] ENDOSCOPY;  Service: Gastroenterology;  Laterality: N/A;   IR FLUORO GUIDE CV LINE RIGHT  03/24/2022   IR FLUORO GUIDE CV LINE RIGHT  07/24/2022   IR PERC TUN PERIT CATH WO PORT S&I /IMAG  03/24/2022   IR REMOVAL TUN CV CATH W/O FL  05/08/2022   IR US GUIDE BX ASP/DRAIN  04/30/2020   IR US GUIDE VASC ACCESS RIGHT  03/24/2022   IR US GUIDE VASC ACCESS RIGHT  07/24/2022   KIDNEY TRANSPLANT  2001   KIDNEY TRANSPLANT  2014   LEEP     ORIF TIBIA PLATEAU Right 01/22/2022   Procedure: OPEN REDUCTION INTERNAL FIXATION (ORIF) TIBIAL PLATEAU;  Surgeon: Cammy Copa, MD;  Location: [facility] OR;  Service: Orthopedics;  Laterality: Right;   TOTAL ABDOMINAL HYSTERECTOMY  ovaries left in situ   TOTAL HIP ARTHROPLASTY Right 01/22/2022   Procedure: TOTAL HIP ARTHROPLASTY ANTERIOR APPROACH;  Surgeon: Tarry Kos, MD;  Location: [facility] OR;  Service: Orthopedics;  Laterality: Right;   TOTAL HIP ARTHROPLASTY Left 09/28/2022   Procedure: TOTAL HIP POSTERIOR;  Surgeon: Teryl Lucy, MD;  Location: [facility] OR;  Service: Orthopedics;  Laterality:  Left;    Family History  Problem Relation Age of Onset   Cerebral aneurysm Mother    AAA (abdominal aortic aneurysm) Brother    Breast cancer Paternal Aunt    Heart attack Maternal Grandmother    Brain cancer Maternal Grandfather    Colon cancer Neg Hx    Ovarian cancer Neg Hx    Endometrial cancer Neg Hx    Pancreatic cancer Neg Hx    Prostate cancer Neg Hx     Social History   Socioeconomic History   Marital status: Married    Spouse name: Not on file   Number of children: Not on file   Years of education: Not on file   Highest education level: Not on file  Occupational History   Not on file  Tobacco Use   Smoking status: Never   Smokeless tobacco: Never  Vaping Use   Vaping status: Never Used  Substance and Sexual Activity   Alcohol use: Not Currently   Drug use: Never   Sexual activity: Not Currently  Other Topics Concern   Not on file  Social History Narrative   Not on file   Social Determinants of Health   Financial Resource Strain: Low Risk  (05/01/2022)   Received from [facility]   Overall Financial Resource Strain (CARDIA)    Difficulty of Paying Living Expenses: Not very hard  Food Insecurity: No Food Insecurity (09/27/2022)   Hunger Vital Sign    Worried About Running Out of Food in the Last Year: Never true    Ran Out of Food in the Last Year: Never true  Transportation Needs: No Transportation Needs (09/27/2022)   PRAPARE - Administrator, Civil Service (Medical): No    Lack of Transportation (Non-Medical): No  Physical Activity: Not on file  Stress: No Stress Concern Present (04/04/2020)   Received from [facility] - Occupational Stress Questionnaire    Feeling of Stress : Not at all  Social Connections: Unknown (08/16/2021)   Received from [facility]   Social Network    Social Network: Not on file    Current Medications:  Current Outpatient Medications:     apixaban (ELIQUIS) 2.5 MG TABS tablet, Take 1 tablet (2.5 mg total) by mouth every 12 (twelve) hours., Disp: 60 tablet, Rfl: 1   Calcium Carbonate Antacid (TUMS PO), Take 2 tablets by mouth as needed (when eating dairy)., Disp: , Rfl:    carvedilol (COREG) 12.5 MG tablet, Take 2 tablets (25 mg total) by mouth 2 (two) times daily with a meal. (Patient taking differently: Take 25 mg by mouth. Take 12.5 mg by mouth in the morning on Monday, Tuesday, Thursday , and Friday. Take 12.5 mg by mouth every evening), Disp: , Rfl:    feeding supplement (ENSURE ENLIVE / ENSURE PLUS) LIQD, Take 237 mLs by mouth 2 (two) times daily between meals., Disp: 237 mL, Rfl: 12   methocarbamol (ROBAXIN) 500 MG tablet, Take 1 tablet (500 mg total) by mouth every 6 (six) hours as needed for muscle spasms., Disp: 30  tablet, Rfl: 0   multivitamin (RENA-VIT) TABS tablet, Take 1 tablet by mouth at bedtime., Disp: 30 tablet, Rfl: 1   oxyCODONE (OXY IR/ROXICODONE) 5 MG immediate release tablet, Take 1-2 tablets (5-10 mg total) by mouth every 4 (four) hours as needed for moderate pain (pain score 4-6)., Disp: 30 tablet, Rfl: 0   predniSONE (DELTASONE) 5 MG tablet, Take 5 mg by mouth daily with breakfast., Disp: , Rfl:    tacrolimus (PROGRAF) 1 MG capsule, Take 1 mg by mouth 2 (two) times daily., Disp: , Rfl:    pantoprazole (PROTONIX) 40 MG tablet, TAKE 1 TABLET (40 MG TOTAL) BY MOUTH 2 (TWO) TIMES DAILY BEFORE A MEAL. (Patient taking differently: Take 40 mg by mouth 2 (two) times daily before a meal.), Disp: 60 tablet, Rfl: 0  Review of Systems: + Leg swelling, joint pain Denies appetite changes, fevers, chills, fatigue, unexplained weight changes. Denies hearing loss, neck lumps or masses, mouth sores, ringing in ears or voice changes. Denies cough or wheezing.  Denies shortness of breath. Denies chest pain or palpitations.  Denies abdominal distention, pain, blood in stools, constipation, diarrhea, nausea, vomiting, or early  satiety. Denies pain with intercourse, dysuria, frequency, hematuria or incontinence. Denies hot flashes, pelvic pain, vaginal bleeding or vaginal discharge.   Denies back pain or muscle pain/cramps. Denies itching, rash, or wounds. Denies dizziness, headaches, numbness or seizures. Denies swollen lymph nodes or glands, denies easy bruising or bleeding. Denies anxiety, depression, confusion, or decreased concentration.  Physical Exam: BP (!) 191/100 (BP Location: Left Arm, Patient Position: Sitting) Comment: MD notified pt. forgot to take BP pill. Had dialysis today and does not usually take them on these days.  Pulse 95   Temp 98.9 F (37.2 C) (Oral)   Resp 17   Ht 5\' 5"  (1.651 m)   Wt 129 lb 6.4 oz (58.7 kg)   SpO2 100%   BMI 21.53 kg/m  General: Alert, oriented, no acute distress. HEENT: Normocephalic, atraumatic, sclera anicteric. Chest: Unlabored breathing on room air. GU: Deferred given recent surgery, limited mobility. Extremities: Significant edema of the right lower extremity which is quite tense.  Very mild erythema and a small area along the anterior lower leg.   Laboratory & Radiologic Studies: MRI pelvis 07/23/22: 1. Chronic osteomyelitis involving the right inferior pubic ramus which demonstrates destructive bony changes. 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 2. Persistent air containing fistulous track possibly involving the anterior wall the rectum or anus and extending into the region of the inferior pubic ramus. No discrete fluid collection/abscess is identified. 3. Small amount of free pelvic fluid of uncertain significance. 4. Diffuse cellulitis and myofasciitis involving hip and pelvic musculature most notably the adductor muscles bilaterally. No definite findings for pyomyositis.  Assessment & Plan: Christina Mack is a 57 y.o. woman with history of stage IIIC vulvar  cancer presenting for surveillance visit today. Completed treatment 01/2020. Completed hyperbaric oxygen therapy for chronic right inguinal wound. 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  The patient is overall doing well, recovering from recent fall which required total left hip replacement.  She has recently reached out to multiple providers at [facility] as she has not heard anything since April with regard to her definitive osteomyelitis surgery and reconstruction.  This will involve Ortho oncology, plastic surgery, GYN oncology.  She  is hoping to have this in the next several months.  She will remain dialysis dependent until she is a candidate for kidney transplant.  We discussed plan for follow-up visit in 3 months, exam at that time.  No evidence of lower extremity cellulitis although significant edema.  Patient has a prescription for antibiotics if she were to develop exam findings concerning for cellulitis again.  20 minutes of total time was spent for this patient encounter, including preparation, face-to-face counseling with the patient and coordination of care, and documentation of the encounter.  Eugene Garnet, MD  Division of Gynecologic Oncology  Department of Obstetrics and Gynecology  [facility]

## 2022-10-12 NOTE — Patient Instructions (Signed)
It was good to see you today. I will see you in 3 months. Please let me know if you have any new and concerning symptoms.  Please also keep me updated on progress with scheduling surgery at [facility].

## 2022-10-24 ENCOUNTER — Encounter: Payer: Medicare Other | Admitting: Vascular Surgery

## 2022-11-06 ENCOUNTER — Ambulatory Visit (INDEPENDENT_AMBULATORY_CARE_PROVIDER_SITE_OTHER): Payer: Medicare Other | Admitting: Infectious Diseases

## 2022-11-06 ENCOUNTER — Encounter: Payer: Self-pay | Admitting: Infectious Diseases

## 2022-11-06 ENCOUNTER — Other Ambulatory Visit: Payer: Self-pay

## 2022-11-06 VITALS — BP 178/103 | HR 89 | Temp 98.3°F | Ht 65.0 in | Wt 145.1 lb

## 2022-11-06 DIAGNOSIS — M86651 Other chronic osteomyelitis, right thigh: Secondary | ICD-10-CM | POA: Diagnosis not present

## 2022-11-06 DIAGNOSIS — N184 Chronic kidney disease, stage 4 (severe): Secondary | ICD-10-CM

## 2022-11-06 DIAGNOSIS — L03115 Cellulitis of right lower limb: Secondary | ICD-10-CM

## 2022-11-06 DIAGNOSIS — I890 Lymphedema, not elsewhere classified: Secondary | ICD-10-CM | POA: Diagnosis not present

## 2022-11-06 DIAGNOSIS — M8660 Other chronic osteomyelitis, unspecified site: Secondary | ICD-10-CM

## 2022-11-06 DIAGNOSIS — M86151 Other acute osteomyelitis, right femur: Secondary | ICD-10-CM

## 2022-11-06 MED ORDER — CEFADROXIL 500 MG PO CAPS: 500.0000 mg | ORAL_CAPSULE | Freq: Every evening | ORAL | 1 refills | Status: DC

## 2022-11-06 NOTE — Progress Notes (Unsigned)
Patient: Christina Mack  DOB: 1965-07-10 MRN: [id number] PCP: System, Provider Not In    Subjective:   CC: Hospital follow up cellulitis     HPI:  Hospitalized and now s/p left THR following a fall 09/27/2022 Dion Saucier).  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 Will be more complicated multi-specialty procedure between plastics, ortho onc and gyn onc at [facility] to rexect ischial ramus with flap reconstruction. Awaiting clearance from nephrology to pursue surgery. Still with persistent RLE swelling / lymphedema. She has not had to use her on demand antibiotics since our last visit in May of this year.   Started cefadroxil for acute cellulitis concern. - Friday took her last dose and had  HD  Home training for HD   Has been doing 6" ace wrap from mid shin and thigh.    Review of Systems  Constitutional:  Negative for chills and fever.  HENT:  Negative for tinnitus.   Eyes:  Negative for blurred vision and photophobia.  Respiratory:  Negative for cough and sputum production.   Cardiovascular:  Negative for chest pain.  Gastrointestinal:  Negative for diarrhea, nausea and vomiting.  Genitourinary:  Negative for dysuria.  Skin:  Negative for rash.  Neurological:  Negative for headaches.     Past Medical History:  Diagnosis Date   Complication of anesthesia    nausea and vomiting   Dialysis patient (HCC)    Currenlty back on dialysis   DVT (deep venous thrombosis) (HCC)    History of osteomyelitis    Kidney transplant failure    Kidney transplant recipient    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[facility])     Outpatient Medications Prior to Visit  Medication Sig Dispense Refill   apixaban (ELIQUIS) 2.5 MG TABS tablet Take 1 tablet (2.5 mg total) by mouth every 12 (twelve) hours. 60 tablet 1   Calcium Carbonate Antacid (TUMS PO) Take 2 tablets by mouth as needed (when eating dairy).     carvedilol  (COREG) 12.5 MG tablet Take 2 tablets (25 mg total) by mouth 2 (two) times daily with a meal. (Patient taking differently: Take 25 mg by mouth. Take 12.5 mg by mouth in the morning on Monday, Tuesday, Thursday , and Friday. Take 12.5 mg by mouth every evening)     feeding supplement (ENSURE ENLIVE / ENSURE PLUS) LIQD Take 237 mLs by mouth 2 (two) times daily between meals. 237 mL 12   methocarbamol (ROBAXIN) 500 MG tablet Take 1 tablet (500 mg total) by mouth every 6 (six) hours as needed for muscle spasms. 30 tablet 0   multivitamin (RENA-VIT) TABS tablet Take 1 tablet by mouth at bedtime. 30 tablet 1   oxyCODONE (OXY IR/ROXICODONE) 5 MG immediate release tablet Take 1-2 tablets (5-10 mg total) by mouth every 4 (four) hours as needed for moderate pain (pain score 4-6). 30 tablet 0   pantoprazole (PROTONIX) 40 MG tablet TAKE 1 TABLET (40 MG TOTAL) BY MOUTH 2 (TWO) TIMES DAILY BEFORE A MEAL. (Patient taking differently: Take 40 mg by mouth 2 (two) times daily before a meal.) 60 tablet 0   predniSONE (DELTASONE) 5 MG tablet Take 5 mg by mouth daily with breakfast.     tacrolimus (PROGRAF) 1 MG capsule Take 1 mg by mouth 2 (two) times daily.     No facility-administered medications prior to visit.     Allergies  Allergen Reactions   Oxycodone-Acetaminophen Itching, Nausea And Vomiting, Nausea Only, Rash and Other (See Comments)    Headaches, also    Zosyn [Piperacillin Sod-Tazobactam So] Other (See Comments)    Thrombocytopenia   Hydromorphone Nausea And Vomiting, Rash and Other (See Comments)   Amlodipine Other (See Comments) and Swelling    Ankle swelling   Codeine Other (See Comments)    vomiting   Hydralazine     Headache   Zyvox [Linezolid] Other (See Comments)    Decreased platelet counts    Social History   Tobacco Use   Smoking status: Never   Smokeless tobacco: Never  Vaping Use   Vaping status: Never Used  Substance Use Topics   Alcohol use: Not Currently   Drug use:  Never     Objective:   There were no vitals filed for this visit.  There is no height or weight on file to calculate BMI.  Physical Exam Constitutional:      Appearance: Normal appearance. She is not ill-appearing.  HENT:     Mouth/Throat:     Mouth: Mucous membranes are moist.     Pharynx: Oropharynx is clear.  Eyes:     General: No scleral icterus. Cardiovascular:     Rate and Rhythm: Normal rate and regular rhythm.  Pulmonary:     Effort: Pulmonary effort is normal.  Musculoskeletal:     Right lower leg: Edema present.     Comments: She has pitting edema from her right foot all the way up to her right hip.  Her skin is normal color and temperature.  Skin:    General: Skin is warm and dry.  Neurological:     Mental Status: She is oriented to person, place, and time.  Psychiatric:        Mood and Affect: Mood normal.        Thought Content: Thought content normal.     Lab Results: Lab Results  Component Value Date   WBC 7.0 09/30/2022   HGB 7.6 (L) 09/30/2022   HCT 24.1 (L) 09/30/2022   MCV 93.8 09/30/2022   PLT 163 09/30/2022    Lab Results  Component Value Date   CREATININE 4.96 (H) 09/30/2022   BUN 34 (H) 09/30/2022   NA 133 (L) 09/30/2022   K 4.4 09/30/2022   CL 95 (L) 09/30/2022   CO2 25 09/30/2022    Lab Results  Component Value Date   ALT 8 07/28/2022   AST 9 (L) 07/28/2022   ALKPHOS 67 07/28/2022   BILITOT 0.8 07/28/2022     Assessment & Plan:   Problem List Items Addressed This Visit   None   Hospital records and specialty visits reviewed available in Epic.    Rexene Alberts, MSN, NP-C [facility] Pager: [phone] Office: [phone]  11/06/22  9:40 AM

## 2022-11-06 NOTE — Patient Instructions (Addendum)
If you have more chills or measured fevers please let me know - I think you are done with the antibiotics for the cellulitis

## 2022-11-07 ENCOUNTER — Emergency Department (HOSPITAL_COMMUNITY): Payer: Medicare Other

## 2022-11-07 ENCOUNTER — Other Ambulatory Visit: Payer: Self-pay

## 2022-11-07 ENCOUNTER — Emergency Department (HOSPITAL_COMMUNITY)
Admission: EM | Admit: 2022-11-07 | Discharge: 2022-11-07 | Disposition: A | Discharge status: Home / Self Care | Payer: Medicare Other | Attending: Emergency Medicine | Admitting: Emergency Medicine

## 2022-11-07 ENCOUNTER — Encounter (HOSPITAL_COMMUNITY): Payer: Self-pay

## 2022-11-07 DIAGNOSIS — Z7901 Long term (current) use of anticoagulants: Secondary | ICD-10-CM | POA: Insufficient documentation

## 2022-11-07 DIAGNOSIS — G43909 Migraine, unspecified, not intractable, without status migrainosus: Secondary | ICD-10-CM

## 2022-11-07 DIAGNOSIS — Z79899 Other long term (current) drug therapy: Secondary | ICD-10-CM | POA: Insufficient documentation

## 2022-11-07 DIAGNOSIS — D631 Anemia in chronic kidney disease: Secondary | ICD-10-CM | POA: Insufficient documentation

## 2022-11-07 DIAGNOSIS — Z8544 Personal history of malignant neoplasm of other female genital organs: Secondary | ICD-10-CM | POA: Diagnosis not present

## 2022-11-07 DIAGNOSIS — I120 Hypertensive chronic kidney disease with stage 5 chronic kidney disease or end stage renal disease: Secondary | ICD-10-CM | POA: Diagnosis not present

## 2022-11-07 DIAGNOSIS — N186 End stage renal disease: Secondary | ICD-10-CM | POA: Diagnosis not present

## 2022-11-07 DIAGNOSIS — R519 Headache, unspecified: Secondary | ICD-10-CM | POA: Diagnosis present

## 2022-11-07 DIAGNOSIS — Z992 Dependence on renal dialysis: Secondary | ICD-10-CM | POA: Diagnosis not present

## 2022-11-07 DIAGNOSIS — I151 Hypertension secondary to other renal disorders: Secondary | ICD-10-CM

## 2022-11-07 LAB — BASIC METABOLIC PANEL
Anion gap: 13 (ref 5–15)
BUN: 25 mg/dL — ABNORMAL HIGH (ref 6–20)
CO2: 28 mmol/L (ref 22–32)
Calcium: 9.5 mg/dL (ref 8.9–10.3)
Chloride: 94 mmol/L — ABNORMAL LOW (ref 98–111)
Creatinine, Ser: 4.49 mg/dL — ABNORMAL HIGH (ref 0.44–1.00)
GFR, Estimated: 11 mL/min — ABNORMAL LOW (ref >60)
Glucose, Bld: 106 mg/dL — ABNORMAL HIGH (ref 70–99)
Potassium: 3.5 mmol/L (ref 3.5–5.1)
Sodium: 135 mmol/L (ref 135–145)

## 2022-11-07 LAB — CBC
HCT: 25.9 % — ABNORMAL LOW (ref 36.0–46.0)
Hemoglobin: 7.8 g/dL — ABNORMAL LOW (ref 12.0–15.0)
MCH: 28.9 pg (ref 26.0–34.0)
MCHC: 30.1 g/dL (ref 30.0–36.0)
MCV: 95.9 fL (ref 80.0–100.0)
Platelets: UNDETERMINED 10*3/uL (ref 150–400)
RBC: 2.7 MIL/uL — ABNORMAL LOW (ref 3.87–5.11)
RDW: 17.6 % — ABNORMAL HIGH (ref 11.5–15.5)
WBC: 6 10*3/uL (ref 4.0–10.5)
nRBC: 0 % (ref 0.0–0.2)

## 2022-11-07 LAB — TROPONIN I (HIGH SENSITIVITY): Troponin I (High Sensitivity): 7 ng/L (ref <18)

## 2022-11-07 MED ORDER — PROCHLORPERAZINE EDISYLATE 10 MG/2ML IJ SOLN
10.0000 mg | Freq: Once | INTRAMUSCULAR | Status: AC
  Administered 2022-11-07: 10 mg via INTRAVENOUS
  Filled 2022-11-07: qty 2

## 2022-11-07 MED ORDER — FENTANYL CITRATE PF 50 MCG/ML IJ SOSY
50.0000 ug | PREFILLED_SYRINGE | Freq: Once | INTRAMUSCULAR | Status: AC
  Administered 2022-11-07: 50 ug via INTRAVENOUS
  Filled 2022-11-07: qty 1

## 2022-11-07 MED ORDER — LABETALOL HCL 5 MG/ML IV SOLN
10.0000 mg | Freq: Once | INTRAVENOUS | Status: AC
  Administered 2022-11-07: 10 mg via INTRAVENOUS
  Filled 2022-11-07: qty 4

## 2022-11-07 MED ORDER — DIPHENHYDRAMINE HCL 50 MG/ML IJ SOLN
12.5000 mg | Freq: Once | INTRAMUSCULAR | Status: AC
  Administered 2022-11-07: 12.5 mg via INTRAVENOUS
  Filled 2022-11-07: qty 1

## 2022-11-07 MED ORDER — ACETAMINOPHEN 500 MG PO TABS
1000.0000 mg | ORAL_TABLET | Freq: Once | ORAL | Status: AC
  Administered 2022-11-07: 1000 mg via ORAL
  Filled 2022-11-07: qty 2

## 2022-11-07 MED ORDER — DROPERIDOL 2.5 MG/ML IJ SOLN
2.5000 mg | Freq: Once | INTRAMUSCULAR | Status: AC
  Administered 2022-11-07: 2.5 mg via INTRAVENOUS
  Filled 2022-11-07: qty 2

## 2022-11-07 MED ORDER — CARVEDILOL 12.5 MG PO TABS
25.0000 mg | ORAL_TABLET | Freq: Once | ORAL | Status: AC
  Administered 2022-11-07: 25 mg via ORAL
  Filled 2022-11-07: qty 2

## 2022-11-07 NOTE — ED Provider Notes (Signed)
[place] EMERGENCY DEPARTMENT AT [facility] Provider Note  CSN: [id number] Arrival date & time: 11/07/22 1706  Chief Complaint(s) Hypertension  HPI Christina Mack is a 57 y.o. female history of end-stage renal disease on dialysis, prior vulvar cancer, prior DVT on Eliquis, right lower extremity lymphedema which is chronic, chronic osteomyelitis presenting to the emergency department with headache.  She reports the headache began last night, gradual onset.  Reports mild nausea and a few episodes of vomiting.  Describes it as throbbing.  No visual changes, numbness or tingling, weakness, trouble swallowing, speech changes, facial droop.  No fevers or chills, no neck stiffness.  No pain to the rest of her body including no chest or back pain, no abdominal pain.  She went to dialysis today, her blood pressure was elevated and she was having vomiting so unable to complete dialysis.  She reports high blood pressure, received some clonidine at dialysis as well.   Past Medical History Past Medical History:  Diagnosis Date   Complication of anesthesia    nausea and vomiting   Dialysis patient [facility])    Currenlty back on dialysis   DVT (deep venous thrombosis) (HCC)    History of osteomyelitis    Kidney transplant failure    Kidney transplant recipient    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[facility])    Patient Active Problem List   Diagnosis Date Noted   Closed left hip fracture (HCC) 09/27/2022   ESRD on dialysis (HCC) 09/27/2022   Hypertensive urgency 09/27/2022   Anemia of chronic disease 09/27/2022   GERD (gastroesophageal reflux disease) 09/27/2022   Cellulitis of left leg 08/08/2022   Hypokalemia 07/23/2022   Hypomagnesemia 07/23/2022   Acute renal failure superimposed on stage 4 chronic kidney disease (HCC) 07/22/2022   Hypocalcemia 07/22/2022   History of anemia due to chronic kidney disease 07/22/2022   Gastritis and gastroduodenitis 05/27/2022   Cellulitis of right leg 05/26/2022   Lymphedema  of right lower extremity 05/08/2022   Pain 03/23/2022   Renal failure 03/22/2022   Need for emotional support 03/22/2022   Goals of care, counseling/discussion 03/22/2022   Chronic osteomyelitis (HCC) 03/22/2022   Palliative care encounter 03/22/2022   Bacteremia due to Pseudomonas 03/22/2022   Pressure injury of skin 03/20/2022   AKI (acute kidney injury) (HCC) 03/19/2022   Closed fracture of lateral portion of right tibial plateau 02/03/2022   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   Pancytopenia (HCC) 01/19/2022   At risk for adverse drug interaction 10/13/2021   Long term (current) use of antibiotics 09/16/2021   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 (HCC) 05/17/2021   History of healed fragility fracture 05/17/2021   Leg weakness, bilateral 09/15/2020   Screening for malignant neoplasm of skin 05/31/2020   Immunocompromised state due to drug therapy (HCC) 05/18/2020   Chronic kidney disease (CKD), stage IV (severe) (HCC)    Chronic pain syndrome    Cancer associated pain    Debility    Thrombocytopenia (HCC) 04/26/2020   Normocytic anemia 04/26/2020   CKD (chronic kidney disease) stage 4, GFR 15-29 ml/min (HCC) 04/26/2020   Wound of right groin 04/26/2020   Gastric ulcer without hemorrhage or perforation 04/26/2020   Malnutrition of moderate degree 04/26/2020   Acute deep vein thrombosis (DVT) of femoral vein (HCC) 12/05/2019   Iron deficiency anemia 11/10/2019   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(HCC) 11/06/2019   Sprain of anterior talofibular ligament of right ankle 11/22/2016   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 Authorizing Provider  apixaban (ELIQUIS) 2.5 MG TABS tablet Take 1 tablet (2.5 mg total) by mouth every 12 (twelve) hours. 09/30/22   Jonah Blue, MD  calcitRIOL (ROCALTROL) 0.5 MCG capsule Take by mouth. 10/23/22   [provider]  Calcium Carbonate Antacid (TUMS PO) Take 2 tablets by mouth as needed (when eating dairy).    [provider]  carvedilol (COREG) 12.5 MG tablet Take 2 tablets (25 mg total) by mouth 2 (two) times daily with a meal. Patient taking differently: Take 25 mg by mouth. Take 12.5 mg by mouth in the morning on Monday, Tuesday, Thursday , and Friday. Take 12.5 mg by mouth every evening 05/29/22   Marlin Canary U, DO  cefadroxil (DURICEF) 500 MG capsule Take 1 capsule (500 mg total) by mouth at bedtime. On HD days make sure you take after your session   Start at onset of cellulitis. 11/06/22   Blanchard Kelch, NP  feeding supplement (ENSURE ENLIVE / ENSURE PLUS) LIQD Take 237 mLs by mouth 2 (two) times daily between meals. 09/30/22   Jonah Blue, MD  methocarbamol (ROBAXIN) 500 MG tablet Take 1 tablet (500 mg total) by mouth every 6 (six) hours as needed for muscle spasms. 09/30/22   Jonah Blue, MD  Methoxy PEG-Epoetin Beta (MIRCERA IJ) Inject into the skin. 10/30/22   [provider]  multivitamin (RENA-VIT) TABS tablet Take 1 tablet by mouth at bedtime. 09/30/22   Jonah Blue, MD  oxyCODONE (OXY IR/ROXICODONE) 5 MG immediate release tablet Take 1-2 tablets (5-10 mg total) by mouth every 4 (four) hours as needed for moderate pain (pain score 4-6). 09/30/22   Jonah Blue, MD  pantoprazole (PROTONIX) 40 MG tablet TAKE 1 TABLET (40 MG TOTAL) BY MOUTH 2 (TWO) TIMES DAILY BEFORE A MEAL. Patient taking differently: Take 40 mg by mouth 2 (two) times daily before a meal. 05/17/20 09/27/22  Love, Evlyn Kanner, PA-C  predniSONE (DELTASONE) 5 MG tablet Take 5 mg by mouth daily with breakfast.    [provider]  tacrolimus (PROGRAF) 1  MG capsule Take 1 mg by mouth 2 (two) times daily. 04/12/22   [provider]  valsartan (DIOVAN) 80 MG tablet Take by mouth. 10/30/22   [provider]                                                                                                                                    Past Surgical History Past Surgical History:  Procedure Laterality Date   APPLICATION OF WOUND VAC Right 01/22/2022   Procedure: APPLICATION OF WOUND VAC;  Surgeon: Roda Shutters,  Edwin Cap, MD;  Location: [facility] OR;  Service: Orthopedics;  Laterality: Right;   BIOPSY  04/24/2020   Procedure: BIOPSY;  Surgeon: Sherrilyn Rist, MD;  Location: [facility] ENDOSCOPY;  Service: Gastroenterology;;   BIOPSY  05/27/2022   Procedure: BIOPSY;  Surgeon: Tressia Danas, MD;  Location: [facility] ENDOSCOPY;  Service: Gastroenterology;;   COLONOSCOPY WITH PROPOFOL N/A 05/27/2022   Procedure: COLONOSCOPY WITH PROPOFOL;  Surgeon: Tressia Danas, MD;  Location[facility] ENDOSCOPY;  Service: Gastroenterology;  Laterality: N/A;   ESOPHAGOGASTRODUODENOSCOPY (EGD) WITH PROPOFOL N/A 04/24/2020   Procedure: ESOPHAGOGASTRODUODENOSCOPY (EGD) WITH PROPOFOL;  Surgeon: Sherrilyn Rist, MD;  Location: [facility] ENDOSCOPY;  Service: Gastroenterology;  Laterality: N/A;   ESOPHAGOGASTRODUODENOSCOPY (EGD) WITH PROPOFOL N/A 05/27/2022   Procedure: ESOPHAGOGASTRODUODENOSCOPY (EGD) WITH PROPOFOL;  Surgeon: Tressia Danas, MD;  Location: [facility] ENDOSCOPY;  Service: Gastroenterology;  Laterality: N/A;   GIVENS CAPSULE STUDY N/A 05/28/2022   Procedure: GIVENS CAPSULE STUDY;  Surgeon: Tressia Danas, MD;  Location: [facility] ENDOSCOPY;  Service: Gastroenterology;  Laterality: N/A;   IR FLUORO GUIDE CV LINE RIGHT  03/24/2022   IR FLUORO GUIDE CV LINE RIGHT  07/24/2022   IR PERC TUN PERIT CATH WO PORT S&I /IMAG  03/24/2022   IR REMOVAL TUN CV CATH W/O FL  05/08/2022   IR US GUIDE BX ASP/DRAIN  04/30/2020   IR US GUIDE VASC ACCESS RIGHT  03/24/2022   IR US GUIDE VASC ACCESS RIGHT   07/24/2022   KIDNEY TRANSPLANT  2001   KIDNEY TRANSPLANT  2014   LEEP     ORIF TIBIA PLATEAU Right 01/22/2022   Procedure: OPEN REDUCTION INTERNAL FIXATION (ORIF) TIBIAL PLATEAU;  Surgeon: Cammy Copa, MD;  Location: [facility] OR;  Service: Orthopedics;  Laterality: Right;   TOTAL ABDOMINAL HYSTERECTOMY     ovaries left in situ   TOTAL HIP ARTHROPLASTY Right 01/22/2022   Procedure: TOTAL HIP ARTHROPLASTY ANTERIOR APPROACH;  Surgeon: Tarry Kos, MD;  Location: [facility] OR;  Service: Orthopedics;  Laterality: Right;   TOTAL HIP ARTHROPLASTY Left 09/28/2022   Procedure: TOTAL HIP POSTERIOR;  Surgeon: Teryl Lucy, MD;  Location: [facility] OR;  Service: Orthopedics;  Laterality: Left;   Family History Family History  Problem Relation Age of Onset   Cerebral aneurysm Mother    AAA (abdominal aortic aneurysm) Brother    Breast cancer Paternal Aunt    Heart attack Maternal Grandmother    Brain cancer Maternal Grandfather    Colon cancer Neg Hx    Ovarian cancer Neg Hx    Endometrial cancer Neg Hx    Pancreatic cancer Neg Hx    Prostate cancer Neg Hx     Social History Social History   Tobacco Use   Smoking status: Never   Smokeless tobacco: Never  Vaping Use   Vaping status: Never Used  Substance Use Topics   Alcohol use: Not Currently   Drug use: Never   Allergies Oxycodone-acetaminophen, Zosyn [piperacillin sod-tazobactam so], Hydromorphone, Amlodipine, Codeine, Hydralazine, and Zyvox [linezolid]  Review of Systems Review of Systems  All other systems reviewed and are negative.   Physical Exam Vital Signs  I have reviewed the triage vital signs BP (!) 187/101   Pulse 69   Temp 98.7 F (37.1 C) (Oral)   Resp 14   Ht 5\' 5"  (1.651 m)   Wt 65.8 kg   SpO2 100%   BMI 24.13 kg/m  Physical Exam Vitals and nursing note reviewed.  Constitutional:      General: She is  not in acute distress.    Appearance: She is well-developed.  HENT:     Head: Normocephalic and atraumatic.      Mouth/Throat:     Mouth: Mucous membranes are moist.  Eyes:     Pupils: Pupils are equal, round, and reactive to light.  Cardiovascular:     Rate and Rhythm: Normal rate and regular rhythm.     Heart sounds: No murmur heard. Pulmonary:     Effort: Pulmonary effort is normal. No respiratory distress.     Breath sounds: Normal breath sounds.  Abdominal:     General: Abdomen is flat.     Palpations: Abdomen is soft.     Tenderness: There is no abdominal tenderness.  Musculoskeletal:        General: No tenderness.     Right lower leg: Edema (Pt reports this is chronic) present.     Left lower leg: No edema.  Skin:    General: Skin is warm and dry.  Neurological:     General: No focal deficit present.     Mental Status: She is alert. Mental status is at baseline.     Comments: Cranial nerves II through XII intact, strength 5 out of 5 in the bilateral upper and lower extremities, no sensory deficit to light touch, no dysmetria on finger-nose-finger testing  Psychiatric:        Mood and Affect: Mood normal.        Behavior: Behavior normal.     ED Results and Treatments Labs (all labs ordered are listed, but only abnormal results are displayed) Labs Reviewed  BASIC METABOLIC PANEL - Abnormal; Notable for the following components:      Result Value   Chloride 94 (*)    Glucose, Bld 106 (*)    BUN 25 (*)    Creatinine, Ser 4.49 (*)    GFR, Estimated 11 (*)    All other components within normal limits  CBC - Abnormal; Notable for the following components:   RBC 2.70 (*)    Hemoglobin 7.8 (*)    HCT 25.9 (*)    RDW 17.6 (*)    All other components within normal limits  TROPONIN I (HIGH SENSITIVITY)                                                                                                                          Radiology CT Head Wo Contrast  Result Date: 11/07/2022 CLINICAL DATA:  Headache. EXAM: CT HEAD WITHOUT CONTRAST TECHNIQUE: Contiguous axial images were  obtained from the base of the skull through the vertex without intravenous contrast. RADIATION DOSE REDUCTION: This exam was performed according to the departmental dose-optimization program which includes automated exposure control, adjustment of the mA and/or kV according to patient size and/or use of iterative reconstruction technique. COMPARISON:  July 28, 2022 FINDINGS: Brain: No evidence of acute infarction, hemorrhage, hydrocephalus, extra-axial collection or mass lesion/mass effect. Vascular: No hyperdense vessel or unexpected calcification. Skull:  Normal. Negative for fracture or focal lesion. Sinuses/Orbits: No acute finding. Other: None. IMPRESSION: Normal head CT. Electronically Signed   By: Aram Candela M.D.   On: 11/07/2022 19:35   DG Chest 2 View  Result Date: 11/07/2022 CLINICAL DATA:  Weakness EXAM: CHEST - 2 VIEW COMPARISON:  Chest x-ray dated September 27, 2022 FINDINGS: The heart size and mediastinal contours are within normal limits. Stable position of right central venous catheter. Both lungs are clear. The visualized skeletal structures are unremarkable. IMPRESSION: No active cardiopulmonary disease. Electronically Signed   By: Allegra Lai M.D.   On: 11/07/2022 18:05    Pertinent labs & imaging results that were available during my care of the patient were reviewed by me and considered in my medical decision making (see MDM for details).  Medications Ordered in ED Medications  prochlorperazine (COMPAZINE) injection 10 mg (10 mg Intravenous Given 11/07/22 1841)  carvedilol (COREG) tablet 25 mg (25 mg Oral Given 11/07/22 1846)  diphenhydrAMINE (BENADRYL) injection 12.5 mg (12.5 mg Intravenous Given 11/07/22 1839)  acetaminophen (TYLENOL) tablet 1,000 mg (1,000 mg Oral Given 11/07/22 1845)  labetalol (NORMODYNE) injection 10 mg (10 mg Intravenous Given 11/07/22 2048)  droperidol (INAPSINE) 2.5 MG/ML injection 2.5 mg (2.5 mg Intravenous Given 11/07/22 2050)  fentaNYL (SUBLIMAZE) injection  50 mcg (50 mcg Intravenous Given 11/07/22 2047)                                                                                                                                     Procedures Procedures  (including critical care time)  Medical Decision Making / ED Course   MDM:  58 year old female presenting to the emergency department headache.  Patient very well-appearing, vitals notable for hypertension.  Neurologic exam is normal.  Exam otherwise notable for chronic right lower extremity edema which patient reports is actually better than normal.  Otherwise no focal findings.  Suspect most likely causes possible migraine type headache, patient reports this is kind of what it feels like.  Doubt subarachnoid, symptoms occurred gradually and worsened.  Low concern for acute intracranial bleeding given hypertension we will check CT head to evaluate for bleed.  Doubt venous sinus thrombosis patient is on Eliquis.  No head trauma to suggest skull fracture or traumatic injury.  Will reassess after medications, labs.   Clinical Course as of 11/07/22 2113  Tue Nov 07, 2022  2108 Headache improved, improved slightly with Compazine and patient reports nearly resolved after droperidol and fentanyl.  Patient feels comfortable going home.  CT head is negative for any acute intracranial process.  Troponin is negative.  Advised further follow-up with her dialysis physician for improved blood pressure control.  Patient request discharge. Will discharge patient to home. All questions answered. Patient comfortable with plan of discharge. Return precautions discussed with patient and specified on the after visit summary.  [WS]    Clinical Course User Index [WS] Alvino Blood  L, MD     Additional history obtained: -Additional history obtained from spouse -External records from outside source obtained and reviewed including: Chart review including previous notes, labs, imaging, consultation notes  including primary care provider notes, gyn onc notes   Lab Tests: -I ordered, reviewed, and interpreted labs.   The pertinent results include:   Labs Reviewed  BASIC METABOLIC PANEL - Abnormal; Notable for the following components:      Result Value   Chloride 94 (*)    Glucose, Bld 106 (*)    BUN 25 (*)    Creatinine, Ser 4.49 (*)    GFR, Estimated 11 (*)    All other components within normal limits  CBC - Abnormal; Notable for the following components:   RBC 2.70 (*)    Hemoglobin 7.8 (*)    HCT 25.9 (*)    RDW 17.6 (*)    All other components within normal limits  TROPONIN I (HIGH SENSITIVITY)    Notable for sign of ESRD, chronic anemia, normal troponin  EKG   EKG Interpretation Date/Time:  Tuesday November 07 2022 17:00:11 EDT Ventricular Rate:  76 PR Interval:  142 QRS Duration:  82 QT Interval:  396 QTC Calculation: 445 R Axis:   50  Text Interpretation: Normal sinus rhythm Minimal voltage criteria for LVH, may be normal variant ( Sokolow-Lyon ) Borderline ECG When compared with ECG of 28-Jul-2022 07:24, No significant change since last tracing Confirmed by Alvino Blood ([id number]) on 11/07/2022 5:44:49 PM         Imaging Studies ordered: I ordered imaging studies including CT bbrain, CXR On my interpretation imaging demonstrates no acute process I independently visualized and interpreted imaging. I agree with the radiologist interpretation   Medicines ordered and prescription drug management: Meds ordered this encounter  Medications   prochlorperazine (COMPAZINE) injection 10 mg   carvedilol (COREG) tablet 25 mg   diphenhydrAMINE (BENADRYL) injection 12.5 mg   acetaminophen (TYLENOL) tablet 1,000 mg   labetalol (NORMODYNE) injection 10 mg   droperidol (INAPSINE) 2.5 MG/ML injection 2.5 mg   fentaNYL (SUBLIMAZE) injection 50 mcg    -I have reviewed the patients home medicines and have made adjustments as needed     Cardiac Monitoring: The patient  was maintained on a cardiac monitor.  I personally viewed and interpreted the cardiac monitored which showed an underlying rhythm of: NSR  Reevaluation: After the interventions noted above, I reevaluated the patient and found that their symptoms have improved  Co morbidities that complicate the patient evaluation  Past Medical History:  Diagnosis Date   Complication of anesthesia    nausea and vomiting   Dialysis patient (HCC)    Currenlty back on dialysis   DVT (deep venous thrombosis) (HCC)    History of osteomyelitis    Kidney transplant failure    Kidney transplant recipient    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[facility])       Dispostion: Disposition decision including need for hospitalization was considered, and patient discharged from emergency department.    Final Clinical Impression(s) / ED Diagnoses Final diagnoses:  Migraine without status migrainosus, not intractable, unspecified migraine type  Hypertension secondary to other renal disorders     This chart was dictated using voice recognition software.  Despite best efforts to proofread,  errors can occur which can change the documentation meaning.    Lonell Grandchild, MD 11/07/22 2113

## 2022-11-07 NOTE — Assessment & Plan Note (Signed)
Christina Mack completed 2 week course of cefadroxil 500 mg daily this past Friday for concern over relapsed cellulitis involving the RLE. She felt like this was improved however she did have a day at the end of her treatment where she had significant chills and fatigue - this only lasted 1 day and has not recurred since. On exam she still has a lot of edema noted and hyperemic skin changes from chronic lymphedema that is tender but no erythema or warmth indicating ongoing infection.  I think she is OK to stop abx and observe off therapy. I will refill her cefadroxil - counseled that now she is on HD to take her dose of antibiotics after her HD treatment session to ensure she has it stay in her system.

## 2022-11-07 NOTE — Assessment & Plan Note (Signed)
On Home HD sessions now M/T/Th/Sat - Follows with Dr. Keitha Butte locally at [place] [facility] and Dr. Rennis Harding at [facility].

## 2022-11-07 NOTE — ED Triage Notes (Addendum)
Pt reports she had a headache that started last night and worsened when she woke up today around 0400. She also reports her blood pressures have been high, BP at triage is 201/117. She had dialysis today but she did not complete it due to vomiting x 2. HD Mon, Tue, Thur, and Fridays.

## 2022-11-07 NOTE — Discharge Instructions (Signed)
We evaluated you for your headache and high blood pressure.  Your blood work was reassuring.  Your CT scan did not show any dangerous brain issues such as bleeding or a tumor.  Your symptoms improved with treatment in the emergency department.  I think your headache is most likely a migraine headache.  I am not sure whether this was triggered by her high blood pressure or your discomfort from your headache caused your blood pressure to be elevated.  Please follow-up closely with your kidney doctor for further management of your blood pressure medications.  Please return to the emergency department if you develop any new or worsening symptoms such as fevers or chills, neck stiffness, severe pain, uncontrolled vomiting, chest pain or difficulty breathing, or any other concerning symptoms.

## 2022-11-07 NOTE — Assessment & Plan Note (Signed)
Awaiting resection with multidisiplinary team at [facility] ortho onc, GYN Onc and plastics for resection of ischial ramus and flap reconstruction.  I asked her to please keep me updated if she has worsening pain of the hip esp resurgence in the SI area where she was previously affected during acute flares of OM. I do wonder if current pain is d/t accomodation of left hip replacement and surgical recovery as it is located more in the outer right hip musculature.  MRI in April reviewed - low threshold to repeat and follow for any advancing/acute changes r/t OM that may need re-treatment.

## 2022-11-07 NOTE — Assessment & Plan Note (Addendum)
I don't think MRI would be helpful to investigate her lymphedema/swelling - Has a h/o right femoral DVT but has been on Eliquis continuously however has been maintained at the reduce dose 2.5 mg bid. With her resuming HD, I will reach out to nephrology to see if she should be back on 5 mg BID.  May be helpful to re-order vascular study of RLE to reassess femoral vein clot history.  Would continue the compression wraps thigh high as she can tolerate, lymphedema clinic when able for further recommendations. Agree with foot pump therapy - 2-3 times a day to help.  Keep on demand abx for cellulitis - would do 2 weeks tx as she is slow to respond.

## 2022-11-10 ENCOUNTER — Ambulatory Visit (HOSPITAL_COMMUNITY)
Admission: RE | Admit: 2022-11-10 | Discharge: 2022-11-10 | Disposition: A | Discharge status: Home / Self Care | Payer: Medicare Other | Source: Ambulatory Visit | Attending: Infectious Diseases | Admitting: Infectious Diseases

## 2022-11-10 DIAGNOSIS — M86151 Other acute osteomyelitis, right femur: Secondary | ICD-10-CM | POA: Diagnosis present

## 2022-11-10 DIAGNOSIS — M8660 Other chronic osteomyelitis, unspecified site: Secondary | ICD-10-CM | POA: Diagnosis present

## 2022-11-10 NOTE — Progress Notes (Addendum)
11/10/2022   After the appointment with me Tresa Endo sent me a message overnight on Friday morning with the following:   My right hip has pain daily, the hard area is painful and now has an intermittent burning sensation. I can't lay on that side due to the pain and the pain is hendering my sleep, hince why I'm sending this at 2am. I just don't want something to be going on in that area and me end up being hospitalized again. Can you please order an MRI? I would truly be appreciative.    Will order a MRI w/o contrast of pelvis for her and see if I can get in touch with ortho onc team with Dr. Ranae Palms at [facility] to update them. MRI is needed for both better understanding if there has been any progression of known pelvic osteomyelitis and for surgical planning efforts.    Rexene Alberts, MSN, NP-C [facility]  [email or web address] Pager: [phone] Office: [phone] [facility] Main Line: [phone] *Secure Chat Communication Welcome

## 2022-11-10 NOTE — Addendum Note (Signed)
Addended by: Blanchard Kelch on: 11/10/2022 08:50 AM   Modules accepted: Orders

## 2022-11-20 ENCOUNTER — Other Ambulatory Visit (HOSPITAL_COMMUNITY): Payer: Self-pay | Admitting: Nephrology

## 2022-11-20 DIAGNOSIS — N186 End stage renal disease: Secondary | ICD-10-CM

## 2022-11-22 ENCOUNTER — Other Ambulatory Visit (HOSPITAL_COMMUNITY): Payer: Self-pay | Admitting: Nephrology

## 2022-11-22 ENCOUNTER — Ambulatory Visit (HOSPITAL_COMMUNITY)
Admission: RE | Admit: 2022-11-22 | Discharge: 2022-11-22 | Disposition: A | Discharge status: Home / Self Care | Payer: Medicare Other | Source: Ambulatory Visit | Attending: Nephrology | Admitting: Nephrology

## 2022-11-22 DIAGNOSIS — T8249XA Other complication of vascular dialysis catheter, initial encounter: Secondary | ICD-10-CM | POA: Diagnosis present

## 2022-11-22 DIAGNOSIS — N186 End stage renal disease: Secondary | ICD-10-CM

## 2022-11-22 DIAGNOSIS — Y841 Kidney dialysis as the cause of abnormal reaction of the patient, or of later complication, without mention of misadventure at the time of the procedure: Secondary | ICD-10-CM | POA: Insufficient documentation

## 2022-11-22 DIAGNOSIS — Z992 Dependence on renal dialysis: Secondary | ICD-10-CM | POA: Insufficient documentation

## 2022-11-22 HISTORY — PX: IR PTA VENOUS EXCEPT DIALYSIS CIRCUIT: IMG6126

## 2022-11-22 HISTORY — PX: IR FLUORO GUIDE CV LINE RIGHT: IMG2283

## 2022-11-22 MED ORDER — HEPARIN SODIUM (PORCINE) 1000 UNIT/ML IJ SOLN
INTRAMUSCULAR | Status: AC
  Filled 2022-11-22: qty 10

## 2022-11-22 MED ORDER — LORAZEPAM 0.5 MG PO TABS
ORAL_TABLET | ORAL | Status: AC
  Filled 2022-11-22: qty 2

## 2022-11-22 MED ORDER — VANCOMYCIN HCL IN DEXTROSE 1-5 GM/200ML-% IV SOLN
1000.0000 mg | Freq: Once | INTRAVENOUS | Status: AC
  Administered 2022-11-22: 1000 mg via INTRAVENOUS

## 2022-11-22 MED ORDER — VANCOMYCIN HCL IN DEXTROSE 1-5 GM/200ML-% IV SOLN
INTRAVENOUS | Status: AC
  Filled 2022-11-22: qty 200

## 2022-11-22 MED ORDER — CHLORHEXIDINE GLUCONATE 4 % EX SOLN
CUTANEOUS | Status: AC
  Filled 2022-11-22: qty 15

## 2022-11-22 MED ORDER — LORAZEPAM 0.5 MG PO TABS
1.0000 mg | ORAL_TABLET | Freq: Once | ORAL | Status: AC
  Administered 2022-11-22: 1 mg via ORAL

## 2022-11-22 MED ORDER — IOHEXOL 300 MG/ML  SOLN
50.0000 mL | Freq: Once | INTRAMUSCULAR | Status: AC | PRN
  Administered 2022-11-22: 20 mL via INTRAVENOUS

## 2022-11-22 MED ORDER — LIDOCAINE-EPINEPHRINE 1 %-1:100000 IJ SOLN
INTRAMUSCULAR | Status: AC
  Filled 2022-11-22: qty 1

## 2022-11-22 NOTE — H&P (Signed)
Patient Status: [facility] - Out-pt  Assessment and Plan: Patient in need of tunneled dialysis catheter exchange due to frequent line malfunction.  Patient with line malfunction, intermittent stoppage required during dialysis.  Request made for CVC replacement with longer catheter.  Patient presents for procedure today in her usual state of health.  She requests anti-anxiety medication.  She does have a ride home with her husband today.  1mg  PO ativan ordered.  She also has an IV for Vanc infusion with placement today.   Risks and benefits discussed with the patient including, but not limited to bleeding, infection, vascular injury, pneumothorax which may require chest tube placement, air embolism or even death  All of the patient's questions were answered, patient is agreeable to proceed. Consent signed and in chart.  ______________________________________________________________________   History of Present Illness: Christina Mack is a 57 y.o. female with past medical history of end stage renal disease, HD dependent via R internal jugular tunneled dialysis catheter.  She reports intermittent issues with catheter during dialysis-- frequent stoppage for what she is being told is "suctioning of catheter against the vessel wall."  Exchange for longer catheter requested.   Allergies and medications reviewed.   Review of Systems: A 12 point ROS discussed and pertinent positives are indicated in the HPI above.  All other systems are negative.  Review of Systems  Constitutional:  Negative for fatigue and fever.  Respiratory:  Negative for cough and shortness of breath.   Gastrointestinal:  Negative for abdominal pain.  Musculoskeletal:  Negative for back pain.  Psychiatric/Behavioral:  Negative for behavioral problems and confusion.     Vital Signs: BP (!) 167/94 (BP Location: Right Arm)   Pulse 80   SpO2 98%   Physical Exam Vitals and nursing note reviewed.  Constitutional:       General: She is not in acute distress.    Appearance: Normal appearance. She is not ill-appearing.  Cardiovascular:     Rate and Rhythm: Normal rate and regular rhythm.  Pulmonary:     Effort: Pulmonary effort is normal.     Breath sounds: Normal breath sounds.  Abdominal:     General: Abdomen is flat.     Palpations: Abdomen is soft.  Skin:    General: Skin is warm and dry.  Neurological:     General: No focal deficit present.     Mental Status: She is alert and oriented to person, place, and time. Mental status is at baseline.  Psychiatric:        Mood and Affect: Mood normal.        Behavior: Behavior normal.        Thought Content: Thought content normal.        Judgment: Judgment normal.      Imaging reviewed.   Labs:  COAGS: Recent Labs    03/22/22 1634 04/18/22 1209 07/21/22 2157 07/28/22 0801  INR 1.2 1.1 1.3* 1.3*  APTT 35  --  40* 38*    BMP: Recent Labs    09/28/22 0534 09/29/22 0527 09/30/22 0307 11/07/22 1720  NA 133* 134* 133* 135  K 4.9 6.1* 4.4 3.5  CL 98 98 95* 94*  CO2 22 19* 25 28  GLUCOSE 97 159* 131* 106*  BUN 42* 63* 34* 25*  CALCIUM 9.0 8.5* 8.1* 9.5  CREATININE 7.08* 8.66* 4.96* 4.49*  GFRNONAA 6* 5* 10* 11*       Electronically Signed: Hoyt Koch, PA 11/22/2022, 8:25 AM  I spent a total of 15 minutes in face to face in clinical consultation, greater than 50% of which was counseling/coordinating care for venous access.

## 2022-11-28 ENCOUNTER — Ambulatory Visit: Payer: Medicare Other | Admitting: Orthopaedic Surgery

## 2022-11-29 ENCOUNTER — Encounter: Payer: Medicare Other | Admitting: Vascular Surgery

## 2022-12-06 ENCOUNTER — Ambulatory Visit (INDEPENDENT_AMBULATORY_CARE_PROVIDER_SITE_OTHER): Payer: Medicare Other | Admitting: Vascular Surgery

## 2022-12-06 ENCOUNTER — Encounter: Payer: Self-pay | Admitting: Vascular Surgery

## 2022-12-06 ENCOUNTER — Ambulatory Visit (INDEPENDENT_AMBULATORY_CARE_PROVIDER_SITE_OTHER): Payer: Medicare Other | Admitting: Orthopaedic Surgery

## 2022-12-06 VITALS — BP 152/92 | HR 89 | Temp 98.5°F | Resp 20 | Ht 65.0 in | Wt 140.0 lb

## 2022-12-06 DIAGNOSIS — Z96641 Presence of right artificial hip joint: Secondary | ICD-10-CM

## 2022-12-06 DIAGNOSIS — N186 End stage renal disease: Secondary | ICD-10-CM

## 2022-12-06 NOTE — Progress Notes (Signed)
Patient ID: Christina Mack, female   DOB: Sep 23, 1965, 57 y.o.   MRN: [id number]  Reason for Consult: New Patient (Initial Visit)   Referred by Arita Miss, MD  Subjective:     HPI:  Christina Mack is a 57 y.o. female with now history of end-stage renal disease on dialysis via catheter.  She is right-hand dominant.  She does not take blood thinners.  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  She has never had a left upper extremity, chest or breast surgery.  Past Medical History:  Diagnosis Date   Complication of anesthesia    nausea and vomiting   Dialysis patient (HCC)    Currenlty back on dialysis   DVT (deep venous thrombosis) (HCC)    History of osteomyelitis    Kidney transplant failure    Kidney transplant recipient    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(HCC)    Family History  Problem Relation Age of Onset   Cerebral aneurysm Mother    AAA (abdominal aortic aneurysm) Brother    Breast cancer Paternal Aunt    Heart attack Maternal Grandmother    Brain cancer Maternal Grandfather    Colon cancer Neg Hx    Ovarian cancer Neg Hx    Endometrial cancer Neg Hx    Pancreatic cancer Neg Hx    Prostate cancer Neg Hx    Past Surgical History:  Procedure Laterality Date   APPLICATION OF WOUND VAC Right 01/22/2022   Procedure: APPLICATION OF WOUND VAC;  Surgeon: Tarry Kos, MD;  Location: [facility] OR;  Service: Orthopedics;  Laterality: Right;   BIOPSY  04/24/2020   Procedure: BIOPSY;  Surgeon: Sherrilyn Rist, MD;  Location: [facility] ENDOSCOPY;  Service: Gastroenterology;;   BIOPSY  05/27/2022   Procedure: BIOPSY;  Surgeon: Tressia Danas, MD;  Location: [facility] ENDOSCOPY;  Service: Gastroenterology;;    COLONOSCOPY WITH PROPOFOL N/A 05/27/2022   Procedure: COLONOSCOPY WITH PROPOFOL;  Surgeon: Tressia Danas, MD;  Location: [facility] ENDOSCOPY;  Service: Gastroenterology;  Laterality: N/A;   ESOPHAGOGASTRODUODENOSCOPY (EGD) WITH PROPOFOL N/A 04/24/2020   Procedure: ESOPHAGOGASTRODUODENOSCOPY (EGD) WITH PROPOFOL;  Surgeon: Sherrilyn Rist, MD;  Location: [facility] ENDOSCOPY;  Service: Gastroenterology;  Laterality: N/A;   ESOPHAGOGASTRODUODENOSCOPY (EGD) WITH PROPOFOL N/A 05/27/2022   Procedure: ESOPHAGOGASTRODUODENOSCOPY (EGD) WITH PROPOFOL;  Surgeon: Tressia Danas, MD;  Location: [facility] ENDOSCOPY;  Service: Gastroenterology;  Laterality: N/A;   GIVENS CAPSULE STUDY N/A 05/28/2022   Procedure: GIVENS CAPSULE STUDY;  Surgeon: Tressia Danas, MD;  Location: [facility] ENDOSCOPY;  Service: Gastroenterology;  Laterality: N/A;   IR FLUORO GUIDE CV LINE RIGHT  03/24/2022   IR FLUORO GUIDE CV LINE RIGHT  07/24/2022   IR FLUORO GUIDE CV LINE RIGHT  11/22/2022   IR PERC TUN PERIT CATH WO PORT S&I /IMAG  03/24/2022   IR PTA VENOUS EXCEPT DIALYSIS CIRCUIT  11/22/2022   IR REMOVAL TUN CV CATH W/O FL  05/08/2022   IR US GUIDE BX ASP/DRAIN  04/30/2020   IR US GUIDE VASC ACCESS RIGHT  03/24/2022   IR US GUIDE VASC ACCESS RIGHT  07/24/2022   KIDNEY TRANSPLANT  2001   KIDNEY TRANSPLANT  2014   LEEP  ORIF TIBIA PLATEAU Right 01/22/2022   Procedure: OPEN REDUCTION INTERNAL FIXATION (ORIF) TIBIAL PLATEAU;  Surgeon: Cammy Copa, MD;  Location: [facility] OR;  Service: Orthopedics;  Laterality: Right;   TOTAL ABDOMINAL HYSTERECTOMY     ovaries left in situ   TOTAL HIP ARTHROPLASTY Right 01/22/2022   Procedure: TOTAL HIP ARTHROPLASTY ANTERIOR APPROACH;  Surgeon: Tarry Kos, MD;  Location: [facility] OR;  Service: Orthopedics;  Laterality: Right;   TOTAL HIP ARTHROPLASTY Left 09/28/2022   Procedure: TOTAL HIP POSTERIOR;  Surgeon: Teryl Lucy, MD;  Location: [facility] OR;  Service: Orthopedics;  Laterality: Left;    Short Social History:   Social History   Tobacco Use   Smoking status: Never   Smokeless tobacco: Never  Substance Use Topics   Alcohol use: Not Currently    Allergies  Allergen Reactions   Zosyn [Piperacillin Sod-Tazobactam So] Other (See Comments)    Thrombocytopenia   Hydromorphone Nausea And Vomiting, Rash and Other (See Comments)   Amlodipine Other (See Comments) and Swelling    Ankle swelling   Codeine Other (See Comments)    vomiting   Hydralazine     Headache   Zyvox [Linezolid] Other (See Comments)    Decreased platelet counts    Current Outpatient Medications  Medication Sig Dispense Refill   apixaban (ELIQUIS) 2.5 MG TABS tablet Take 1 tablet (2.5 mg total) by mouth every 12 (twelve) hours. 60 tablet 1   calcitRIOL (ROCALTROL) 0.5 MCG capsule Take by mouth.     Calcium Carbonate Antacid (TUMS PO) Take 2 tablets by mouth as needed (when eating dairy).     carvedilol (COREG) 12.5 MG tablet Take 2 tablets (25 mg total) by mouth 2 (two) times daily with a meal. (Patient taking differently: Take 25 mg by mouth. Take 12.5 mg by mouth in the morning on Monday, Tuesday, Thursday , and Friday. Take 12.5 mg by mouth every evening)     cefadroxil (DURICEF) 500 MG capsule Take 1 capsule (500 mg total) by mouth at bedtime. On HD days make sure you take after your session   Start at onset of cellulitis. 14 capsule 1   feeding supplement (ENSURE ENLIVE / ENSURE PLUS) LIQD Take 237 mLs by mouth 2 (two) times daily between meals. 237 mL 12   methocarbamol (ROBAXIN) 500 MG tablet Take 1 tablet (500 mg total) by mouth every 6 (six) hours as needed for muscle spasms. 30 tablet 0   Methoxy PEG-Epoetin Beta (MIRCERA IJ) Inject into the skin.     multivitamin (RENA-VIT) TABS tablet Take 1 tablet by mouth at bedtime. 30 tablet 1   oxyCODONE (OXY IR/ROXICODONE) 5 MG immediate release tablet Take 1-2 tablets (5-10 mg total) by mouth every 4 (four) hours as needed for moderate pain (pain score 4-6). 30 tablet 0    predniSONE (DELTASONE) 5 MG tablet Take 5 mg by mouth daily with breakfast.     tacrolimus (PROGRAF) 1 MG capsule Take 1 mg by mouth 2 (two) times daily.     valsartan (DIOVAN) 80 MG tablet Take by mouth.     pantoprazole (PROTONIX) 40 MG tablet TAKE 1 TABLET (40 MG TOTAL) BY MOUTH 2 (TWO) TIMES DAILY BEFORE A MEAL. (Patient taking differently: Take 40 mg by mouth 2 (two) times daily before a meal.) 60 tablet 0   No current facility-administered medications for this visit.    Review of Systems  Constitutional:  Constitutional negative. HENT: HENT negative.  Eyes: Positive for loss of vision.  Respiratory: Respiratory negative.  Cardiovascular: Cardiovascular negative.  GI: Gastrointestinal negative.  Musculoskeletal: Musculoskeletal negative.  Skin: Skin negative.  Neurological: Neurological negative. Hematologic: Positive for bruises/bleeds easily.  Psychiatric: Psychiatric negative.        Objective:  Objective   Vitals:   12/06/22 1536  BP: (!) 152/92  Pulse: 89  Resp: 20  Temp: 98.5 F (36.9 C)  SpO2: 98%  Weight: 140 lb (63.5 kg)  Height: 5\' 5"  (1.651 m)   Body mass index is 23.3 kg/m.  Physical Exam HENT:     Nose: Nose normal.     Mouth/Throat:     Mouth: Mucous membranes are moist.  Eyes:     Pupils: Pupils are equal, round, and reactive to light.  Cardiovascular:     Pulses:          Radial pulses are 2+ on the right side and 2+ on the left side.  Abdominal:     General: Abdomen is flat.  Musculoskeletal:     Right lower leg: No edema.     Left lower leg: No edema.  Skin:    Capillary Refill: Capillary refill takes less than 2 seconds.     Findings: Bruising present.  Neurological:     General: No focal deficit present.     Mental Status: She is alert.  Psychiatric:        Mood and Affect: Mood normal.        Thought Content: Thought content normal.        Judgment: Judgment normal.     Data: Right Cephalic   Diameter (cm)Depth  (cm)Findings   +-----------------+-------------+----------+---------+  Antecubital fossa    0.35        0.50              +-----------------+-------------+----------+---------+  Prox forearm         0.27        0.48              +-----------------+-------------+----------+---------+  Mid forearm          0.32        0.27              +-----------------+-------------+----------+---------+  Dist forearm         0.31        0.25   branching  +-----------------+-------------+----------+---------+  Wrist               0.27        0.13              +-----------------+-------------+----------+---------+   +-----------------+-------------+----------+---------+  Right Basilic    Diameter (cm)Depth (cm)Findings   +-----------------+-------------+----------+---------+  Mid upper arm        0.63                          +-----------------+-------------+----------+---------+  Dist upper arm       0.58                          +-----------------+-------------+----------+---------+  Antecubital fossa    0.46               branching  +-----------------+-------------+----------+---------+  Prox forearm         0.35                          +-----------------+-------------+----------+---------+  Mid forearm  0.24               branching  +-----------------+-------------+----------+---------+  Distal forearm       0.22                          +-----------------+-------------+----------+---------+  Wrist               0.20                          +-----------------+-------------+----------+---------+   +-----------------+-------------+----------+---------+  Left Cephalic    Diameter (cm)Depth (cm)Findings   +-----------------+-------------+----------+---------+  Antecubital fossa    0.72        0.86   branching  +-----------------+-------------+----------+---------+  Prox forearm         0.42        0.48    branching  +-----------------+-------------+----------+---------+  Mid forearm          0.40        0.36              +-----------------+-------------+----------+---------+  Dist forearm         0.39        0.17              +-----------------+-------------+----------+---------+  Wrist               0.42        0.38   branching  +-----------------+-------------+----------+---------+   +-----------------+-------------+----------+---------+  Left Basilic     Diameter (cm)Depth (cm)Findings   +-----------------+-------------+----------+---------+  Mid upper arm        0.62                          +-----------------+-------------+----------+---------+  Dist upper arm       0.56                          +-----------------+-------------+----------+---------+  Antecubital fossa    0.39               branching  +-----------------+-------------+----------+---------+  Prox forearm         0.26               branching  +-----------------+-------------+----------+---------+  Mid forearm          0.35                          +-----------------+-------------+----------+---------+  Distal forearm       0.37                          +-----------------+-------------+----------+---------+  Wrist               0.29                          +-----------------+-------------+----------+---------+      Assessment/Plan:    57 year old female with end-stage renal disease currently dialyzing via catheter.  In April she appeared to have suitable cephalic and basilic veins in her nondominant left upper extremity.  Patient undergoing workup for pelvic osteomyelitis at [facility] and would like to delay any access surgeries prior to having that treated which is certainly understandable.  She can call to have left arm fistula versus graft when she is ready without further evaluation.  Maeola Harman MD [facility]

## 2022-12-06 NOTE — Progress Notes (Signed)
Office Visit Note   Patient: Christina Mack           Date of Birth: 20-Apr-1965           MRN: [id number] Visit Date: 12/06/2022              Requested by: No referring provider defined for this encounter. PCP: System, Provider Not In   Assessment & Plan: Visit Diagnoses:  1. Status post total replacement of right hip     Plan: Christina Mack is a 57 year old female with right hip pain impression is hip flexor overuse due to compensation from recent left hip surgery for femoral neck fracture.  I do not see any evidence of problems with the implant or the surgical site.  I recommend that she continue to do home health PT for strengthening.  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  She goes to [facility] for this.  Follow-Up Instructions: No follow-ups on file.   Orders:  No orders of the defined types were placed in this encounter.  No orders of the defined types were placed in this encounter.     Procedures: No procedures performed   Clinical Data: No additional findings.   Subjective: Chief Complaint  Patient presents with   Right Hip - Pain   Right Knee - Pain    HPI [name] is a 57 year old female who is well-known to me who comes in for evaluation of right thigh pain that radiates to the knee that started a few weeks ago.  Pain wakes her up at night.  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  She underwent a left total hip replacement towards the end of June for femoral neck fracture.  She has been rehabbing this. Review of Systems  Constitutional: Negative.   HENT: Negative.    Eyes: Negative.   Respiratory: Negative.    Cardiovascular: Negative.   Endocrine: Negative.   Musculoskeletal: Negative.   Neurological: Negative.   Hematological: Negative.   Psychiatric/Behavioral: Negative.    All other systems reviewed and are negative.    Objective: Vital Signs: There were no vitals taken for this visit.  Physical  Exam Vitals and nursing note reviewed.  Constitutional:      Appearance: She is well-developed.  HENT:     Head: Atraumatic.     Nose: Nose normal.  Eyes:     Extraocular Movements: Extraocular movements intact.  Cardiovascular:     Pulses: Normal pulses.  Pulmonary:     Effort: Pulmonary effort is normal.  Abdominal:     Palpations: Abdomen is soft.  Musculoskeletal:     Cervical back: Neck supple.  Skin:    General: Skin is warm.     Capillary Refill: Capillary refill takes less than 2 seconds.  Neurological:     Mental Status: She is alert. Mental status is at baseline.  Psychiatric:        Behavior: Behavior normal.        Thought Content: Thought content normal.        Judgment: Judgment normal.     Ortho Exam Examination right hip shows anterior thigh pain.  She has good fluid range of motion of the hip.  Surgical scar is fully healed. Specialty Comments:  No specialty comments available.  Imaging: No results found.   PMFS History: Patient Active Problem List   Diagnosis Date Noted   Closed left hip fracture (HCC) 09/27/2022  ESRD on dialysis (HCC) 09/27/2022   Hypertensive urgency 09/27/2022   Anemia of chronic disease 09/27/2022   GERD (gastroesophageal reflux disease) 09/27/2022   Cellulitis of left leg 08/08/2022   Hypokalemia 07/23/2022   Hypomagnesemia 07/23/2022   Acute renal failure superimposed on stage 4 chronic kidney disease (HCC) 07/22/2022   Hypocalcemia 07/22/2022   History of anemia due to chronic kidney disease 07/22/2022   Gastritis and gastroduodenitis 05/27/2022   Cellulitis of right leg 05/26/2022   Lymphedema of right lower extremity 05/08/2022   Pain 03/23/2022   Renal failure 03/22/2022   Need for emotional support 03/22/2022   Goals of care, counseling/discussion 03/22/2022   Chronic osteomyelitis (HCC) 03/22/2022   Palliative care encounter 03/22/2022   Bacteremia due to Pseudomonas 03/22/2022   Pressure injury of skin  03/20/2022   AKI (acute kidney injury) (HCC) 03/19/2022   Closed fracture of lateral portion of right tibial plateau 02/03/2022   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   Pancytopenia (HCC) 01/19/2022   At risk for adverse drug interaction 10/13/2021   Long term (current) use of antibiotics 09/16/2021   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 (HCC) 05/17/2021   History of healed fragility fracture 05/17/2021   Leg weakness, bilateral 09/15/2020   Screening for malignant neoplasm of skin 05/31/2020   Immunocompromised state due to drug therapy (HCC) 05/18/2020   Chronic kidney disease (CKD), stage IV (severe) (HCC)    Chronic pain syndrome    Cancer associated pain    Debility    Thrombocytopenia (HCC) 04/26/2020   Normocytic anemia 04/26/2020   CKD (chronic kidney disease) stage 4, GFR 15-29 ml/min (HCC) 04/26/2020   Wound of right groin 04/26/2020   Gastric ulcer without hemorrhage or perforation 04/26/2020   Malnutrition of moderate degree 04/26/2020   Acute deep vein thrombosis (DVT) of femoral vein (HCC) 12/05/2019   Iron deficiency anemia 11/10/2019   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(HCC) 11/06/2019   Sprain of anterior talofibular ligament of right ankle 11/22/2016   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   Aftercare following organ transplant 02/24/2013   Immunosuppression (HCC) 02/24/2013   Kidney transplant status 11/22/2012   Essential hypertension 11/22/2012   Hyperparathyroidism, secondary renal (HCC) 11/22/2012   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   Past Medical History:  Diagnosis Date   Complication of anesthesia    nausea and vomiting   Dialysis patient (HCC)    Currenlty back on dialysis   DVT (deep venous thrombosis) (HCC)    History of osteomyelitis    Kidney transplant failure    Kidney transplant recipient    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(HCC)     Family History  Problem Relation Age of Onset   Cerebral aneurysm Mother    AAA (abdominal aortic  aneurysm) Brother    Breast cancer Paternal Aunt    Heart attack Maternal Grandmother    Brain cancer Maternal Grandfather    Colon cancer Neg Hx    Ovarian cancer Neg Hx    Endometrial cancer Neg Hx    Pancreatic cancer Neg Hx    Prostate cancer Neg Hx     Past Surgical History:  Procedure Laterality Date   APPLICATION OF WOUND VAC Right 01/22/2022   Procedure: APPLICATION OF WOUND VAC;  Surgeon: Tarry Kos, MD;  Location: [facility] OR;  Service: Orthopedics;  Laterality: Right;   BIOPSY  04/24/2020   Procedure: BIOPSY;  Surgeon: Sherrilyn Rist, MD;  Location: [facility] ENDOSCOPY;  Service: Gastroenterology;;  BIOPSY  05/27/2022   Procedure: BIOPSY;  Surgeon: Tressia Danas, MD;  Location: [facility] ENDOSCOPY;  Service: Gastroenterology;;   COLONOSCOPY WITH PROPOFOL N/A 05/27/2022   Procedure: COLONOSCOPY WITH PROPOFOL;  Surgeon: Tressia Danas, MD;  Location: [facility] ENDOSCOPY;  Service: Gastroenterology;  Laterality: N/A;   ESOPHAGOGASTRODUODENOSCOPY (EGD) WITH PROPOFOL N/A 04/24/2020   Procedure: ESOPHAGOGASTRODUODENOSCOPY (EGD) WITH PROPOFOL;  Surgeon: Sherrilyn Rist, MD;  Location: [facility] ENDOSCOPY;  Service: Gastroenterology;  Laterality: N/A;   ESOPHAGOGASTRODUODENOSCOPY (EGD) WITH PROPOFOL N/A 05/27/2022   Procedure: ESOPHAGOGASTRODUODENOSCOPY (EGD) WITH PROPOFOL;  Surgeon: Tressia Danas, MD;  Location: [facility] ENDOSCOPY;  Service: Gastroenterology;  Laterality: N/A;   GIVENS CAPSULE STUDY N/A 05/28/2022   Procedure: GIVENS CAPSULE STUDY;  Surgeon: Tressia Danas, MD;  Location: [facility] ENDOSCOPY;  Service: Gastroenterology;  Laterality: N/A;   IR FLUORO GUIDE CV LINE RIGHT  03/24/2022   IR FLUORO GUIDE CV LINE RIGHT  07/24/2022   IR FLUORO GUIDE CV LINE RIGHT  11/22/2022   IR PERC TUN PERIT CATH WO PORT S&I /IMAG  03/24/2022   IR PTA VENOUS EXCEPT DIALYSIS CIRCUIT  11/22/2022   IR REMOVAL TUN CV CATH W/O FL  05/08/2022   IR US GUIDE BX ASP/DRAIN  04/30/2020   IR US GUIDE VASC ACCESS RIGHT   03/24/2022   IR US GUIDE VASC ACCESS RIGHT  07/24/2022   KIDNEY TRANSPLANT  2001   KIDNEY TRANSPLANT  2014   LEEP     ORIF TIBIA PLATEAU Right 01/22/2022   Procedure: OPEN REDUCTION INTERNAL FIXATION (ORIF) TIBIAL PLATEAU;  Surgeon: Cammy Copa, MD;  Location: [facility] OR;  Service: Orthopedics;  Laterality: Right;   TOTAL ABDOMINAL HYSTERECTOMY     ovaries left in situ   TOTAL HIP ARTHROPLASTY Right 01/22/2022   Procedure: TOTAL HIP ARTHROPLASTY ANTERIOR APPROACH;  Surgeon: Tarry Kos, MD;  Location: [facility] OR;  Service: Orthopedics;  Laterality: Right;   TOTAL HIP ARTHROPLASTY Left 09/28/2022   Procedure: TOTAL HIP POSTERIOR;  Surgeon: Teryl Lucy, MD;  Location: [facility] OR;  Service: Orthopedics;  Laterality: Left;   Social History   Occupational History   Not on file  Tobacco Use   Smoking status: Never   Smokeless tobacco: Never  Vaping Use   Vaping status: Never Used  Substance and Sexual Activity   Alcohol use: Not Currently   Drug use: Never   Sexual activity: Not Currently

## 2022-12-25 NOTE — Addendum Note (Signed)
Encounter addended by: Edward Qualia on: 12/25/2022 10:04 AM  Actions taken: Imaging Exam ended

## 2023-01-04 ENCOUNTER — Telehealth: Payer: Self-pay | Admitting: *Deleted

## 2023-01-04 NOTE — Telephone Encounter (Signed)
Spoke with the patient and moved her appt on 10/11 to earlier in the day

## 2023-01-09 ENCOUNTER — Encounter (INDEPENDENT_AMBULATORY_CARE_PROVIDER_SITE_OTHER): Payer: Medicare Other

## 2023-01-09 MED ORDER — CEFADROXIL 500 MG PO CAPS: 500.0000 mg | ORAL_CAPSULE | Freq: Every evening | ORAL | 3 refills | Status: DC

## 2023-01-10 NOTE — Telephone Encounter (Signed)
Please see the MyChart message reply(ies) for my assessment and plan.    This patient gave consent for this Medical Advice Message and is aware that it may result in a bill to [employer], as well as the possibility of receiving a bill for a co-payment or deductible. They are an established patient, but are not seeking medical advice exclusively about a problem treated during an in person or video visit in the last seven days. I did not recommend an in person or video visit within seven days of my reply.    I spent a total of 6 minutes cumulative time within 7 days through [employer].  Rexene Alberts, NP

## 2023-01-12 ENCOUNTER — Inpatient Hospital Stay: Payer: Medicare Other | Attending: Gynecologic Oncology | Admitting: Gynecologic Oncology

## 2023-01-12 ENCOUNTER — Encounter: Payer: Self-pay | Admitting: Gynecologic Oncology

## 2023-01-12 VITALS — BP 140/86 | HR 98 | Temp 98.4°F | Resp 20 | Ht 65.0 in | Wt 148.8 lb

## 2023-01-12 DIAGNOSIS — Z8544 Personal history of malignant neoplasm of other female genital organs: Secondary | ICD-10-CM | POA: Insufficient documentation

## 2023-01-12 DIAGNOSIS — Z9221 Personal history of antineoplastic chemotherapy: Secondary | ICD-10-CM | POA: Insufficient documentation

## 2023-01-12 DIAGNOSIS — L03115 Cellulitis of right lower limb: Secondary | ICD-10-CM | POA: Diagnosis not present

## 2023-01-12 DIAGNOSIS — S31109S Unspecified open wound of abdominal wall, unspecified quadrant without penetration into peritoneal cavity, sequela: Secondary | ICD-10-CM | POA: Diagnosis not present

## 2023-01-12 DIAGNOSIS — Z923 Personal history of irradiation: Secondary | ICD-10-CM | POA: Insufficient documentation

## 2023-01-12 DIAGNOSIS — I890 Lymphedema, not elsewhere classified: Secondary | ICD-10-CM | POA: Diagnosis not present

## 2023-01-12 DIAGNOSIS — B952 Enterococcus as the cause of diseases classified elsewhere: Secondary | ICD-10-CM | POA: Insufficient documentation

## 2023-01-12 DIAGNOSIS — C519 Malignant neoplasm of vulva, unspecified: Secondary | ICD-10-CM

## 2023-01-12 NOTE — Progress Notes (Signed)
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 Earlene Plater)  Initial exam by Dr. Earlene Plater on 09/08/19: right vulva was completely involved by a large necrotic mass extending from the inguinal crease towards the anus measuring 12x5cm. It involved the distal vagina and extended to involve the left labia as well. It was difficult to evaluate her vaginal involvement due to pain.   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(HCC)  09/10/2019 Initial Biopsy   Vulvar biopsy: at least VIN3, biopsy demonstrates extreme ulceration, inflammation and granulation tissue. Underlying invasion very difficult to rule out.   09/16/2019 Imaging   PET: IMPRESSION:  1.  Large hypermetabolic mass in the right side of the perineum, consistent with known vulvar malignancy.  2.  Hypermetabolic right groin lymphadenopathy is consistent with nodal involvement.  3.  Tiny right apical pulmonary nodule is below PET resolution.    09/16/2019 Initial Diagnosis   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(HCC)   11/03/2019 - 01/15/2020 Radiation Therapy   IMRT. She received 45GY/25 fractions to the pelvic and inguinal nodes and vulvar mass. The vulvar mass and enlarged inguinal nodes received a boost dose of 1800cGY/10 fractions for total dose of 63Gy/35 fractions.  Treated in [place]. Diagnosed with DVT several weeks into treatment. Missed treatments due to feeling poorly. Received 4 Cisplatin (stopped due to elevated Cr) - 8/5, 8/11, 8/18, 8/25   04/03/2020 Imaging   CT A/P w/o contrast: 1.  Redemonstration of the large right perineal mass, with new extensive surrounding soft tissue stranding and gas which extends along the bilateral  anterior pubic bones and anterior pelvis.  I'm unable to exclude developing necrotizing fasciitis.  No definite drainable abscess collection is identified.  2.  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  3.  Diffuse edema of the pelvic mesentery with mild presacral fluid, concerning for mesenteritis.  No definite drainable intraperitoneal fluid collection identified.  4.  Suspected bowel ileus or gastroenteritis.  5.  Moderately distended gallbladder with heterogeneous dependent material suggestive of sludge and possible gallstones.  6.  Hepatosplenomegaly.  7.  Suspected anemia.  8.  Moderate diffuse urinary bladder wall thickening suggesting chronic cystitis.    04/20/2020 - 05/05/2020 Hospital Admission   Admitted in the setting of right groin wound. Had been admitted to [facility] 1/2 with concerns for soft tissue infection. Started on vanc/zosyn --> oral levo/flagyl to complete 3 weeks of treatment ( EOT 1/22). Pelvic MRI at [facility] showed multilocular abscess of left pelvis, myositis, nondisplaced fracture of inferior right pubic ramus, concern for osteomytlitis. IR drainage of abscess was performed - VRE on culture. ID recommended IV dapto --> tedezolid oupatient (as well as levo + flagyl) for 6 weeks total.    04/26/2020 Imaging   MRI pelvis w/o contrast: IMPRESSION: 1. Large area of ulceration with sinus tract seen within the right labial fold with diffuse surrounding phlegmon and extensive myositis in the bilateral adductor and anterior muscular compartments of both thighs. 2. There is a multilocular fluid collection extending in the inferior left pelvic soft tissues surrounding the pubic rami and extending to the anterior upper left thigh, consistent with a multilocular abscess. 3. Nondisplaced fracture of the inferior right pubic rami with findings suggestive osteomyelitis  involving the pubic rami and anterior pubic symphysis. 4. Findings suggestive of  osteomyelitis involving the anterior left pubic symphysis.   05/05/2020 - 05/17/2020 Hospital Admission   Admitted for inpatient rehab given debility.   07/27/2020 Imaging   PET: Significant decrease in size and avidity of vulvar lesion. Air and soft tissue inflammation. Mild PET hypermetabolic activity. Resolution of inguinal LN activity. 9mm LU love groundglass nodule, mild hypermetabolic activity.   09/14/2020 Imaging   CT Chest w/o contrast: IMPRESSION:  1.  Resolution of left upper lobe nodule and associated groundglass opacity.  2.  Stable 5 mm nodular opacity right upper lobe.  3.  Stable 3 mm nodule right lower lobe.    07/20/2021 Imaging   PET: No findings suspicious for recurrent vulvar carcinoma or metastatic disease. 2. Persistent open wound along the right perineum extending down to the bone. There are progressive changes of probable osteomyelitis involving the right inferior pubic ramus with pathologic fractures. 3. Interval improved appearance of the pubic symphysis and adjacent pubic bones.   03/09/2022 Imaging   PET: 1. No signs of distant metastatic disease. 2. Progressive changes of osteomyelitis and signs of pubic bone fracture on the RIGHT. 3. Increasing metabolic activity of the RIGHT groin could relate to worsening inflammation in this area, focal nature does not allow for exclusion of metastatic process. Would suggest focused ultrasound with sampling as warranted. 4. No gross focal uptake to suggest recurrence in the site of resection though there is peripheral uptake that is indeterminate. 5. Gross edema throughout the lower extremity on the RIGHT with expansion of the upper RIGHT thigh. Correlate with any signs of impaired venous drainage. Given the marked asymmetry now nearly 6 weeks following arthroplasty would suggest RIGHT lower extremity venous Doppler to exclude DVT. 6. Anal canal uptake potentially physiologic. Given patient history and proximity  to tumor considered correlation with physical exam. 7. Post RIGHT lower quadrant renal transplant. Amount of uptake and or excretion of FDG by the RIGHT kidney is perhaps less than expected though is not well assessed. Correlate with renal function. 8. Aortic atherosclerosis and coronary artery disease. 9. Post RIGHT hip arthroplasty with increased metabolic activity potentially related to recent surgery. It would be difficult to exclude the possibility of secondary infection of this area in the setting of chronic open wound and worsening sequela of osteomyelitis.   03/20/2022 Imaging   CT A/P: IMPRESSION: 1. Asymmetric soft tissue thickening involving the upper RIGHT perineum, incompletely imaged, with chronic open soft tissue wound underlying the RIGHT inferior pubic ramus. Associated destructive/lytic changes of the RIGHT inferior pubic ramus (discontinuity of the mid/posterior portion of the RIGHT inferior pubic ramus), corresponding to patient's known pathologic process in this area. However, there is increased destruction/discontinuity of the RIGHT inferior pubic ramus compared to the earlier PET-CT of 07/20/2021 indicating some degree of progression. 2. Chronic pathologic-appearing fracture deformity at the lateral aspect of the RIGHT superior pubic ramus/anterior acetabulum. The adjacent RIGHT hip prosthesis appears intact and stable in position. 3. 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 Additional hydronephrosis of the somewhat atrophic-appearing transplant kidney in the LEFT pelvis. Bladder is distended, suggesting associated bladder outlet obstruction.   03/21/2022 Imaging   Abdominal pelvic ultrasound: Sonographic evaluation of the area of concern in the right labia demonstrates soft tissue thickening similar to prior CT scans dating back to 04/03/2020. This may be due to chronic inflammation or postsurgical  change. No discrete fluid collection  identified to indicate abscess.   07/23/2022 Imaging   MRI pelvis: 1. Chronic osteomyelitis involving the right inferior pubic ramus which demonstrates destructive bony changes. 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 2. Persistent air containing fistulous track possibly involving the anterior wall the rectum or anus and extending into the region of the inferior pubic ramus. No discrete fluid collection/abscess is identified. 3. Small amount of free pelvic fluid of uncertain significance. 4. Diffuse cellulitis and myofasciitis involving hip and pelvic musculature most notably the adductor muscles bilaterally. No definite findings for pyomyositis.   11/10/2022 Imaging   MRI pelvis: 1. Changes of chronic osteomyelitis involving the pubic bones with pathologic fractures involving the right superior and inferior pubic rami. Stable widened pubic symphysis related to chronic septic arthritis but overall this appears slightly improved. 2. Persistent air containing fistulous tract in the right inferior pubic region which could possibly communicating with the lower vagina but does not appear to involve the rectum. 3. Persistent changes of myofasciitis involving the pelvic and hip musculature but no findings for pyomyositis or drainable soft tissue abscess. 4. 3.8 cm fluid collection near the greater trochanter of the left hip could reflect bursitis or postoperative fluid collection. 5. Moderate symmetric bladder wall thickening, unchanged.    09/2022: closed L hip fracture requiring total hip replacement surgery.   The patient has now seen plastic surgery, Ortho oncology, and GYN oncology at [facility] with tentative plan for resection of her ischial ramus with reconstruction.  Sounds like things were on hold until the patient was cleared by her nephrologist for  surgery.  Interval History: She met with plastic surgery team at [facility] in early September to discuss surgical options we will plan to order CT angio to look at vasculature for help with planning flap/flap that would be used during the reconstructive process.  She is scheduled for CT angiogram on November 2.  Currently taking antibiotics for cellulitis, started these on Sunday.  Developed a fever as well as nausea and diarrhea prior to redness and swelling developing of her right leg.  Continues to have drainage from her right groin wound.    Continues to do dialysis at home, typically 4 days a week.  Past Medical/Surgical History: Past Medical History:  Diagnosis Date   Complication of anesthesia    nausea and vomiting   Dialysis patient (HCC)    Currenlty back on dialysis   DVT (deep venous thrombosis) (HCC)    History of osteomyelitis    Kidney transplant failure    Kidney transplant recipient    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[facility])     Past Surgical History:  Procedure Laterality Date   APPLICATION OF WOUND VAC Right 01/22/2022   Procedure: APPLICATION OF WOUND VAC;  Surgeon: Tarry Kos, MD;  Location: [facility] OR;  Service: Orthopedics;  Laterality: Right;   BIOPSY  04/24/2020   Procedure: BIOPSY;  Surgeon: Sherrilyn Rist, MD;  Location: [facility] ENDOSCOPY;  Service: Gastroenterology;;   BIOPSY  05/27/2022   Procedure: BIOPSY;  Surgeon: Tressia Danas, MD;  Location: [facility] ENDOSCOPY;  Service: Gastroenterology;;   COLONOSCOPY WITH PROPOFOL N/A 05/27/2022   Procedure: COLONOSCOPY WITH PROPOFOL;  Surgeon: Tressia Danas, MD;  Location: [facility] ENDOSCOPY;  Service: Gastroenterology;  Laterality: N/A;   ESOPHAGOGASTRODUODENOSCOPY (EGD) WITH PROPOFOL N/A 04/24/2020   Procedure: ESOPHAGOGASTRODUODENOSCOPY (EGD) WITH PROPOFOL;  Surgeon: Sherrilyn Rist, MD;  Location: [facility] ENDOSCOPY;  Service: Gastroenterology;  Laterality: N/A;  ESOPHAGOGASTRODUODENOSCOPY (EGD) WITH PROPOFOL N/A 05/27/2022   Procedure:  ESOPHAGOGASTRODUODENOSCOPY (EGD) WITH PROPOFOL;  Surgeon: Tressia Danas, MD;  Location: [facility] ENDOSCOPY;  Service: Gastroenterology;  Laterality: N/A;   GIVENS CAPSULE STUDY N/A 05/28/2022   Procedure: GIVENS CAPSULE STUDY;  Surgeon: Tressia Danas, MD;  Location: [facility] ENDOSCOPY;  Service: Gastroenterology;  Laterality: N/A;   IR FLUORO GUIDE CV LINE RIGHT  03/24/2022   IR FLUORO GUIDE CV LINE RIGHT  07/24/2022   IR FLUORO GUIDE CV LINE RIGHT  11/22/2022   IR PERC TUN PERIT CATH WO PORT S&I /IMAG  03/24/2022   IR PTA VENOUS EXCEPT DIALYSIS CIRCUIT  11/22/2022   IR REMOVAL TUN CV CATH W/O FL  05/08/2022   IR US GUIDE BX ASP/DRAIN  04/30/2020   IR US GUIDE VASC ACCESS RIGHT  03/24/2022   IR US GUIDE VASC ACCESS RIGHT  07/24/2022   KIDNEY TRANSPLANT  2001   KIDNEY TRANSPLANT  2014   LEEP     ORIF TIBIA PLATEAU Right 01/22/2022   Procedure: OPEN REDUCTION INTERNAL FIXATION (ORIF) TIBIAL PLATEAU;  Surgeon: Cammy Copa, MD;  Location: [facility] OR;  Service: Orthopedics;  Laterality: Right;   TOTAL ABDOMINAL HYSTERECTOMY     ovaries left in situ   TOTAL HIP ARTHROPLASTY Right 01/22/2022   Procedure: TOTAL HIP ARTHROPLASTY ANTERIOR APPROACH;  Surgeon: Tarry Kos, MD;  Location: [facility] OR;  Service: Orthopedics;  Laterality: Right;   TOTAL HIP ARTHROPLASTY Left 09/28/2022   Procedure: TOTAL HIP POSTERIOR;  Surgeon: Teryl Lucy, MD;  Location: [facility] OR;  Service: Orthopedics;  Laterality: Left;    Family History  Problem Relation Age of Onset   Cerebral aneurysm Mother    AAA (abdominal aortic aneurysm) Brother    Breast cancer Paternal Aunt    Heart attack Maternal Grandmother    Brain cancer Maternal Grandfather    Colon cancer Neg Hx    Ovarian cancer Neg Hx    Endometrial cancer Neg Hx    Pancreatic cancer Neg Hx    Prostate cancer Neg Hx     Social History   Socioeconomic History   Marital status: Married    Spouse name: Not on file   Number of children: Not on file   Years of  education: Not on file   Highest education level: Not on file  Occupational History   Not on file  Tobacco Use   Smoking status: Never   Smokeless tobacco: Never  Vaping Use   Vaping status: Never Used  Substance and Sexual Activity   Alcohol use: Not Currently   Drug use: Never   Sexual activity: Not Currently  Other Topics Concern   Not on file  Social History Narrative   Not on file   Social Determinants of Health   Financial Resource Strain: Low Risk  (05/01/2022)   [facility]   Overall Financial Resource Strain (CARDIA)    Difficulty of Paying Living Expenses: Not very hard  Food Insecurity: No Food Insecurity (09/27/2022)   Hunger Vital Sign    Worried About Running Out of Food in the Last Year: Never true    Ran Out of Food in the Last Year: Never true  Transportation Needs: No Transportation Needs (09/27/2022)   PRAPARE - Administrator, Civil Service (Medical): No    Lack of Transportation (Non-Medical): No  Physical Activity: Not on file  Stress: No Stress Concern Present (04/04/2020)   Received from [facility],  [facility]   [employer] of Occupational Health - Occupational Stress Questionnaire    Feeling of Stress : Not at all  Social Connections: Unknown (08/16/2021)   Received from [facility], [facility]   Social Network    Social Network: Not on file    Current Medications:  Current Outpatient Medications:    calcitRIOL (ROCALTROL) 0.5 MCG capsule, Take by mouth., Disp: , Rfl:    Calcium Carbonate Antacid (TUMS PO), Take 2 tablets by mouth as needed (when eating dairy)., Disp: , Rfl:    carvedilol (COREG) 12.5 MG tablet, Take 2 tablets (25 mg total) by mouth 2 (two) times daily with a meal. (Patient taking differently: Take 12.5 mg by mouth daily. Only once a day in the evening), Disp: , Rfl:    Methoxy PEG-Epoetin Beta (MIRCERA IJ), Inject into the skin., Disp: , Rfl:    multivitamin (RENA-VIT)  TABS tablet, Take 1 tablet by mouth at bedtime., Disp: 30 tablet, Rfl: 1   oxyCODONE (OXY IR/ROXICODONE) 5 MG immediate release tablet, Take 1-2 tablets (5-10 mg total) by mouth every 4 (four) hours as needed for moderate pain (pain score 4-6)., Disp: 30 tablet, Rfl: 0   predniSONE (DELTASONE) 5 MG tablet, Take 5 mg by mouth daily with breakfast., Disp: , Rfl:    tacrolimus (PROGRAF) 1 MG capsule, Take 1 mg by mouth 2 (two) times daily., Disp: , Rfl:    apixaban (ELIQUIS) 2.5 MG TABS tablet, Take 1 tablet (2.5 mg total) by mouth every 12 (twelve) hours. (Patient not taking: Reported on 01/08/2023), Disp: 60 tablet, Rfl: 1   cefadroxil (DURICEF) 500 MG capsule, Take 1 capsule (500 mg total) by mouth at bedtime. On HD days make sure you take after your session   Start at onset of cellulitis., Disp: 14 capsule, Rfl: 3   feeding supplement (ENSURE ENLIVE / ENSURE PLUS) LIQD, Take 237 mLs by mouth 2 (two) times daily between meals. (Patient not taking: Reported on 01/08/2023), Disp: 237 mL, Rfl: 12   methocarbamol (ROBAXIN) 500 MG tablet, Take 1 tablet (500 mg total) by mouth every 6 (six) hours as needed for muscle spasms. (Patient not taking: Reported on 01/08/2023), Disp: 30 tablet, Rfl: 0   pantoprazole (PROTONIX) 40 MG tablet, TAKE 1 TABLET (40 MG TOTAL) BY MOUTH 2 (TWO) TIMES DAILY BEFORE A MEAL. (Patient taking differently: Take 40 mg by mouth 2 (two) times daily before a meal.), Disp: 60 tablet, Rfl: 0  Review of Systems: + numbness bilateral feet Denies appetite changes, fevers, chills, fatigue, unexplained weight changes. Denies hearing loss, neck lumps or masses, mouth sores, ringing in ears or voice changes. Denies cough or wheezing.  Denies shortness of breath. Denies chest pain or palpitations. Denies leg swelling. Denies abdominal distention, pain, blood in stools, constipation, diarrhea, nausea, vomiting, or early satiety. Denies pain with intercourse, dysuria, frequency, hematuria or  incontinence. Denies hot flashes, pelvic pain, vaginal bleeding or vaginal discharge.   Denies joint pain, back pain or muscle pain/cramps. Denies itching, rash, or wounds. Denies dizziness, headaches, or seizures. Denies swollen lymph nodes or glands, denies easy bruising or bleeding. Denies anxiety, depression, confusion, or decreased concentration.  Physical Exam: BP (!) 140/86 Comment: manual recheck, NP notified  Pulse 98   Temp 98.4 F (36.9 C) (Oral)   Resp 20   Ht 5\' 5"  (1.651 m)   Wt 148 lb 12.8 oz (67.5 kg)   SpO2 100%   BMI 24.76 kg/m  General: Alert, oriented, no acute  distress. HEENT: Normocephalic, atraumatic, sclera anicteric. Chest: Clear to auscultation bilaterally.  No wheezes or rhonchi. Cardiovascular: Regular rate and rhythm, no murmurs. Abdomen: soft, nontender.  Normoactive bowel sounds.  No masses or hepatosplenomegaly appreciated.   Extremities: Grossly normal range of motion.  Warm, well perfused.  2+ edema of right leg with erythema of the right lower extremity.  Mild erythema of the inner right thigh. Skin: No rashes or lesions noted. Lymphatics: No cervical, supraclavicular adenopathy.  No left inguinal adenopathy GU:  1-2+ edema of mons. External female genitalia with similar appearance from last visit although the size of her open wound has continued to decrease some.  On palpation, there is a small area where bone can be palpated at the base of this open wound.  She continues to have an open wound along the right groin with complete lack of labia majora and minora on that side.  There is exudate over the base of the wound, perhaps more than at her last exam.  There is continued edema of the left labia, similar.  Urethra is normal in appearance.  There is a small amount of fibrinous exudate over bone within her open wound, no purulence or bleeding.   Radiation changes of the vulva noted with significant edema.  Laboratory & Radiologic Studies: None  new  Assessment & Plan: Christina Mack is a 57 y.o. woman with a history of stage IIIC vulvar cancer presenting for surveillance visit today. Completed treatment 01/2020. Completed hyperbaric oxygen therapy for chronic right inguinal wound. 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   Patient is overall doing well.  Currently on treatment for cellulitis.  She follows with infectious disease here.  She has now seen multiple providers at [facility], most recently plastic surgery.  It sounds like they are going to proceed with large reconstructive surgery as definitive treatment for her osteomyelitis and large open wound.  She is scheduled for a CT angiogram at the beginning of November with then hopefully plan to move forward with scheduling.  I have asked her to call my office so that after she gets the CT scan we can have those images pushed reviewed here.  We discussed plan for follow-up visit in 3 months, exam at that time.  20 minutes of total time was spent for this patient encounter, including preparation, face-to-face counseling with the patient and coordination of care, and documentation of the encounter.  Eugene Garnet, MD  Division of Gynecologic Oncology  Department of Obstetrics and Gynecology  [facility]

## 2023-01-12 NOTE — Patient Instructions (Signed)
It was good to see you today.  Please keep me posted after you get the CT scan at [facility].  Will plan to see you back in 4 months.

## 2023-02-08 ENCOUNTER — Other Ambulatory Visit: Payer: Self-pay

## 2023-02-08 ENCOUNTER — Inpatient Hospital Stay
Admission: RE | Admit: 2023-02-08 | Discharge: 2023-02-08 | Disposition: A | Discharge status: Home / Self Care | Payer: Self-pay | Source: Ambulatory Visit | Attending: Gynecologic Oncology | Admitting: Gynecologic Oncology

## 2023-02-08 DIAGNOSIS — S31109S Unspecified open wound of abdominal wall, unspecified quadrant without penetration into peritoneal cavity, sequela: Secondary | ICD-10-CM

## 2023-02-12 ENCOUNTER — Telehealth: Payer: Self-pay

## 2023-02-12 NOTE — Telephone Encounter (Signed)
Images from CT scan on 11/2 at [facility] can be seen in EPIC. Report and images ready for Dr. Pricilla Holm to review.

## 2023-03-07 ENCOUNTER — Encounter (INDEPENDENT_AMBULATORY_CARE_PROVIDER_SITE_OTHER): Payer: Medicare Other

## 2023-03-07 DIAGNOSIS — L03115 Cellulitis of right lower limb: Secondary | ICD-10-CM

## 2023-03-12 NOTE — Telephone Encounter (Signed)
Please see the MyChart message reply(ies) for my assessment and plan.    This patient gave consent for this Medical Advice Message and is aware that it may result in a bill to their insurance company, as well as the possibility of receiving a bill for a co-payment or deductible. They are an established patient, but are not seeking medical advice exclusively about a problem treated during an in person or video visit in the last seven days. I did not recommend an in person or video visit within seven days of my reply.    I spent a total of 14 minutes cumulative time within 7 days through MyChart messaging.  Shelah Alaney Witter, NP   

## 2023-04-04 HISTORY — PX: CATARACT EXTRACTION: SUR2

## 2023-04-17 ENCOUNTER — Encounter (INDEPENDENT_AMBULATORY_CARE_PROVIDER_SITE_OTHER): Payer: Self-pay

## 2023-04-17 DIAGNOSIS — L03115 Cellulitis of right lower limb: Secondary | ICD-10-CM | POA: Diagnosis not present

## 2023-04-17 MED ORDER — CEFADROXIL 500 MG PO CAPS: 500.0000 mg | ORAL_CAPSULE | Freq: Every evening | ORAL | 3 refills | Status: DC

## 2023-04-17 NOTE — Telephone Encounter (Signed)
Please see the MyChart message reply(ies) for my assessment and plan.    This patient gave consent for this Medical Advice Message and is aware that it may result in a bill to [employer], as well as the possibility of receiving a bill for a co-payment or deductible. They are an established patient, but are not seeking medical advice exclusively about a problem treated during an in person or video visit in the last seven days. I did not recommend an in person or video visit within seven days of my reply.    I spent a total of 6 minutes cumulative time within 7 days through [employer].  Rexene Alberts, NP

## 2023-05-11 ENCOUNTER — Encounter: Payer: Self-pay | Admitting: Gynecologic Oncology

## 2023-05-17 ENCOUNTER — Inpatient Hospital Stay: Payer: Medicare Other | Attending: Gynecologic Oncology | Admitting: Gynecologic Oncology

## 2023-05-17 ENCOUNTER — Encounter: Payer: Self-pay | Admitting: Gynecologic Oncology

## 2023-05-17 VITALS — BP 140/88 | HR 99 | Temp 98.1°F | Resp 20 | Wt 158.6 lb

## 2023-05-17 DIAGNOSIS — Z08 Encounter for follow-up examination after completed treatment for malignant neoplasm: Secondary | ICD-10-CM | POA: Diagnosis present

## 2023-05-17 DIAGNOSIS — M8668 Other chronic osteomyelitis, other site: Secondary | ICD-10-CM | POA: Insufficient documentation

## 2023-05-17 DIAGNOSIS — Z940 Kidney transplant status: Secondary | ICD-10-CM | POA: Insufficient documentation

## 2023-05-17 DIAGNOSIS — Z9221 Personal history of antineoplastic chemotherapy: Secondary | ICD-10-CM | POA: Diagnosis not present

## 2023-05-17 DIAGNOSIS — Z992 Dependence on renal dialysis: Secondary | ICD-10-CM | POA: Insufficient documentation

## 2023-05-17 DIAGNOSIS — Z8739 Personal history of other diseases of the musculoskeletal system and connective tissue: Secondary | ICD-10-CM | POA: Diagnosis not present

## 2023-05-17 DIAGNOSIS — C519 Malignant neoplasm of vulva, unspecified: Secondary | ICD-10-CM

## 2023-05-17 DIAGNOSIS — I890 Lymphedema, not elsewhere classified: Secondary | ICD-10-CM | POA: Diagnosis not present

## 2023-05-17 DIAGNOSIS — Z923 Personal history of irradiation: Secondary | ICD-10-CM | POA: Diagnosis not present

## 2023-05-17 DIAGNOSIS — Z8544 Personal history of malignant neoplasm of other female genital organs: Secondary | ICD-10-CM | POA: Diagnosis not present

## 2023-05-17 NOTE — Progress Notes (Signed)
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 Earlene Plater)  Initial exam by Dr. Earlene Plater on 09/08/19: right vulva was completely involved by a large necrotic mass extending from the inguinal crease towards the anus measuring 12x5cm. It involved the distal vagina and extended to involve the left labia as well. It was difficult to evaluate her vaginal involvement due to pain.   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(HCC)  09/10/2019 Initial Biopsy   Vulvar biopsy: at least VIN3, biopsy demonstrates extreme ulceration, inflammation and granulation tissue. Underlying invasion very difficult to rule out.   09/16/2019 Imaging   PET: IMPRESSION:  1.  Large hypermetabolic mass in the right side of the perineum, consistent with known vulvar malignancy.  2.  Hypermetabolic right groin lymphadenopathy is consistent with nodal involvement.  3.  Tiny right apical pulmonary nodule is below PET resolution.    09/16/2019 Initial Diagnosis   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(HCC)   11/03/2019 - 01/15/2020 Radiation Therapy   IMRT. She received 45GY/25 fractions to the pelvic and inguinal nodes and vulvar mass. The vulvar mass and enlarged inguinal nodes received a boost dose of 1800cGY/10 fractions for total dose of 63Gy/35 fractions.  Treated in [place]. Diagnosed with DVT several weeks into treatment. Missed treatments due to feeling poorly. Received 4 Cisplatin (stopped due to elevated Cr) - 8/5, 8/11, 8/18, 8/25   04/03/2020 Imaging   CT A/P w/o contrast: 1.  Redemonstration of the large right perineal mass, with new extensive surrounding soft tissue stranding and gas which extends along the bilateral  anterior pubic bones and anterior pelvis.  I'm unable to exclude developing necrotizing fasciitis.  No definite drainable abscess collection is identified.  2.  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  3.  Diffuse edema of the pelvic mesentery with mild presacral fluid, concerning for mesenteritis.  No definite drainable intraperitoneal fluid collection identified.  4.  Suspected bowel ileus or gastroenteritis.  5.  Moderately distended gallbladder with heterogeneous dependent material suggestive of sludge and possible gallstones.  6.  Hepatosplenomegaly.  7.  Suspected anemia.  8.  Moderate diffuse urinary bladder wall thickening suggesting chronic cystitis.    04/20/2020 - 05/05/2020 Hospital Admission   Admitted in the setting of right groin wound. Had been admitted to [facility] 1/2 with concerns for soft tissue infection. Started on vanc/zosyn --> oral levo/flagyl to complete 3 weeks of treatment ( EOT 1/22). Pelvic MRI at [facility] showed multilocular abscess of left pelvis, myositis, nondisplaced fracture of inferior right pubic ramus, concern for osteomytlitis. IR drainage of abscess was performed - VRE on culture. ID recommended IV dapto --> tedezolid oupatient (as well as levo + flagyl) for 6 weeks total.    04/26/2020 Imaging   MRI pelvis w/o contrast: IMPRESSION: 1. Large area of ulceration with sinus tract seen within the right labial fold with diffuse surrounding phlegmon and extensive myositis in the bilateral adductor and anterior muscular compartments of both thighs. 2. There is a multilocular fluid collection extending in the inferior left pelvic soft tissues surrounding the pubic rami and extending to the anterior upper left thigh, consistent with a multilocular abscess. 3. Nondisplaced fracture of the inferior right pubic rami with findings suggestive  osteomyelitis involving the pubic rami and anterior pubic symphysis. 4. Findings suggestive of  osteomyelitis involving the anterior left pubic symphysis.   05/05/2020 - 05/17/2020 Hospital Admission   Admitted for inpatient rehab given debility.   07/27/2020 Imaging   PET: Significant decrease in size and avidity of vulvar lesion. Air and soft tissue inflammation. Mild PET hypermetabolic activity. Resolution of inguinal LN activity. 9mm LU love groundglass nodule, mild hypermetabolic activity.   09/14/2020 Imaging   CT Chest w/o contrast: IMPRESSION:  1.  Resolution of left upper lobe nodule and associated groundglass opacity.  2.  Stable 5 mm nodular opacity right upper lobe.  3.  Stable 3 mm nodule right lower lobe.    07/20/2021 Imaging   PET: No findings suspicious for recurrent vulvar carcinoma or metastatic disease. 2. Persistent open wound along the right perineum extending down to the bone. There are progressive changes of probable osteomyelitis involving the right inferior pubic ramus with pathologic fractures. 3. Interval improved appearance of the pubic symphysis and adjacent pubic bones.   03/09/2022 Imaging   PET: 1. No signs of distant metastatic disease. 2. Progressive changes of osteomyelitis and signs of pubic bone fracture on the RIGHT. 3. Increasing metabolic activity of the RIGHT groin could relate to worsening inflammation in this area, focal nature does not allow for exclusion of metastatic process. Would suggest focused ultrasound with sampling as warranted. 4. No gross focal uptake to suggest recurrence in the site of resection though there is peripheral uptake that is indeterminate. 5. Gross edema throughout the lower extremity on the RIGHT with expansion of the upper RIGHT thigh. Correlate with any signs of impaired venous drainage. Given the marked asymmetry now nearly 6 weeks following arthroplasty would suggest RIGHT lower extremity venous Doppler to exclude DVT. 6. Anal canal uptake potentially physiologic. Given patient history and proximity  to tumor considered correlation with physical exam. 7. Post RIGHT lower quadrant renal transplant. Amount of uptake and or excretion of FDG by the RIGHT kidney is perhaps less than expected though is not well assessed. Correlate with renal function. 8. Aortic atherosclerosis and coronary artery disease. 9. Post RIGHT hip arthroplasty with increased metabolic activity potentially related to recent surgery. It would be difficult to exclude the possibility of secondary infection of this area in the setting of chronic open wound and worsening sequela of osteomyelitis.   03/20/2022 Imaging   CT A/P: IMPRESSION: 1. Asymmetric soft tissue thickening involving the upper RIGHT perineum, incompletely imaged, with chronic open soft tissue wound underlying the RIGHT inferior pubic ramus. Associated destructive/lytic changes of the RIGHT inferior pubic ramus (discontinuity of the mid/posterior portion of the RIGHT inferior pubic ramus), corresponding to patient's known pathologic process in this area. However, there is increased destruction/discontinuity of the RIGHT inferior pubic ramus compared to the earlier PET-CT of 07/20/2021 indicating some degree of progression. 2. Chronic pathologic-appearing fracture deformity at the lateral aspect of the RIGHT superior pubic ramus/anterior acetabulum. The adjacent RIGHT hip prosthesis appears intact and stable in position. 3. 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 Additional hydronephrosis of the somewhat atrophic-appearing transplant kidney in the LEFT pelvis. Bladder is distended, suggesting associated bladder outlet obstruction.   03/21/2022 Imaging   Abdominal pelvic ultrasound: Sonographic evaluation of the area of concern in the right labia demonstrates soft tissue thickening similar to prior CT scans dating back to 04/03/2020. This may be due to chronic inflammation or postsurgical  change. No discrete fluid  collection identified to indicate abscess.   07/23/2022 Imaging   MRI pelvis: 1. Chronic osteomyelitis involving the right inferior pubic ramus which demonstrates destructive bony changes. 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 2. Persistent air containing fistulous track possibly involving the anterior wall the rectum or anus and extending into the region of the inferior pubic ramus. No discrete fluid collection/abscess is identified. 3. Small amount of free pelvic fluid of uncertain significance. 4. Diffuse cellulitis and myofasciitis involving hip and pelvic musculature most notably the adductor muscles bilaterally. No definite findings for pyomyositis.   11/10/2022 Imaging   MRI pelvis: 1. Changes of chronic osteomyelitis involving the pubic bones with pathologic fractures involving the right superior and inferior pubic rami. Stable widened pubic symphysis related to chronic septic arthritis but overall this appears slightly improved. 2. Persistent air containing fistulous tract in the right inferior pubic region which could possibly communicating with the lower vagina but does not appear to involve the rectum. 3. Persistent changes of myofasciitis involving the pelvic and hip musculature but no findings for pyomyositis or drainable soft tissue abscess. 4. 3.8 cm fluid collection near the greater trochanter of the left hip could reflect bursitis or postoperative fluid collection. 5. Moderate symmetric bladder wall thickening, unchanged.     Interval History: Overall doing well.  Has seen both plastics and Ortho oncology at [facility], most recently about 2 weeks ago.  Imaging performed to look at vasculature is the discussion continues about skin flap if repair/surgical resection undertaken.  Patient continues to have drainage, unchanged.  Reports baseline bowel bladder  function.  Now doing dialysis at home through her port.  Finished antibiotics for recent treatment of cellulitis.  Past Medical/Surgical History: Past Medical History:  Diagnosis Date   Complication of anesthesia    nausea and vomiting   Dialysis patient (HCC)    Currenlty back on dialysis   DVT (deep venous thrombosis) (HCC)    History of osteomyelitis    Kidney transplant failure    Kidney transplant recipient    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[facility])     Past Surgical History:  Procedure Laterality Date   APPLICATION OF WOUND VAC Right 01/22/2022   Procedure: APPLICATION OF WOUND VAC;  Surgeon: Tarry Kos, MD;  Location: [facility] OR;  Service: Orthopedics;  Laterality: Right;   BIOPSY  04/24/2020   Procedure: BIOPSY;  Surgeon: Sherrilyn Rist, MD;  Location: [facility] ENDOSCOPY;  Service: Gastroenterology;;   BIOPSY  05/27/2022   Procedure: BIOPSY;  Surgeon: Tressia Danas, MD;  Location: [facility] ENDOSCOPY;  Service: Gastroenterology;;   CATARACT EXTRACTION  04/2023   Both   COLONOSCOPY WITH PROPOFOL N/A 05/27/2022   Procedure: COLONOSCOPY WITH PROPOFOL;  Surgeon: Tressia Danas, MD;  Location: [facility] ENDOSCOPY;  Service: Gastroenterology;  Laterality: N/A;   ESOPHAGOGASTRODUODENOSCOPY (EGD) WITH PROPOFOL N/A 04/24/2020   Procedure: ESOPHAGOGASTRODUODENOSCOPY (EGD) WITH PROPOFOL;  Surgeon: Sherrilyn Rist, MD;  Location: [facility] ENDOSCOPY;  Service: Gastroenterology;  Laterality: N/A;   ESOPHAGOGASTRODUODENOSCOPY (EGD) WITH PROPOFOL N/A 05/27/2022   Procedure: ESOPHAGOGASTRODUODENOSCOPY (EGD) WITH PROPOFOL;  Surgeon: Tressia Danas, MD;  Location: [facility] ENDOSCOPY;  Service: Gastroenterology;  Laterality: N/A;   GIVENS CAPSULE STUDY N/A 05/28/2022   Procedure: GIVENS CAPSULE STUDY;  Surgeon: Tressia Danas, MD;  Location: [facility] ENDOSCOPY;  Service: Gastroenterology;  Laterality: N/A;   IR FLUORO GUIDE CV LINE RIGHT  03/24/2022   IR FLUORO GUIDE CV LINE RIGHT  07/24/2022  IR FLUORO GUIDE CV LINE RIGHT   11/22/2022   IR PERC TUN PERIT CATH WO PORT S&I /IMAG  03/24/2022   IR PTA VENOUS EXCEPT DIALYSIS CIRCUIT  11/22/2022   IR REMOVAL TUN CV CATH W/O FL  05/08/2022   IR US GUIDE BX ASP/DRAIN  04/30/2020   IR US GUIDE VASC ACCESS RIGHT  03/24/2022   IR US GUIDE VASC ACCESS RIGHT  07/24/2022   KIDNEY TRANSPLANT  2001   KIDNEY TRANSPLANT  2014   LEEP     ORIF TIBIA PLATEAU Right 01/22/2022   Procedure: OPEN REDUCTION INTERNAL FIXATION (ORIF) TIBIAL PLATEAU;  Surgeon: Cammy Copa, MD;  Location: [facility] OR;  Service: Orthopedics;  Laterality: Right;   TOTAL ABDOMINAL HYSTERECTOMY     ovaries left in situ   TOTAL HIP ARTHROPLASTY Right 01/22/2022   Procedure: TOTAL HIP ARTHROPLASTY ANTERIOR APPROACH;  Surgeon: Tarry Kos, MD;  Location: [facility] OR;  Service: Orthopedics;  Laterality: Right;   TOTAL HIP ARTHROPLASTY Left 09/28/2022   Procedure: TOTAL HIP POSTERIOR;  Surgeon: Teryl Lucy, MD;  Location: [facility] OR;  Service: Orthopedics;  Laterality: Left;    Family History  Problem Relation Age of Onset   Cerebral aneurysm Mother    AAA (abdominal aortic aneurysm) Brother    Breast cancer Paternal Aunt    Heart attack Maternal Grandmother    Brain cancer Maternal Grandfather    Colon cancer Neg Hx    Ovarian cancer Neg Hx    Endometrial cancer Neg Hx    Pancreatic cancer Neg Hx    Prostate cancer Neg Hx     Social History   Socioeconomic History   Marital status: Married    Spouse name: Not on file   Number of children: Not on file   Years of education: Not on file   Highest education level: Not on file  Occupational History   Not on file  Tobacco Use   Smoking status: Never   Smokeless tobacco: Never  Vaping Use   Vaping status: Never Used  Substance and Sexual Activity   Alcohol use: Not Currently   Drug use: Never   Sexual activity: Not Currently  Other Topics Concern   Not on file  Social History Narrative   Not on file   Social Drivers of Health   Financial  Resource Strain: Low Risk  (05/01/2022)   Received [facility], [facility]   Overall Financial Resource Strain (CARDIA)    Difficulty of Paying Living Expenses: Not very hard  Food Insecurity: No Food Insecurity (09/27/2022)   Hunger Vital Sign    Worried About Running Out of Food in the Last Year: Never true    Ran Out of Food in the Last Year: Never true  Transportation Needs: No Transportation Needs (09/27/2022)   PRAPARE - Administrator, Civil Service (Medical): No    Lack of Transportation (Non-Medical): No  Physical Activity: Not on file  Stress: No Stress Concern Present (04/04/2020)   Received from [facility], [facility] of Occupational Health - Occupational Stress Questionnaire    Feeling of Stress : Not at all  Social Connections: Unknown (08/16/2021)   Received from [facility], [facility]   Social Network    Social Network: Not on file    Current Medications:  Current Outpatient Medications:    calcitRIOL (ROCALTROL) 0.5 MCG capsule, Take by mouth., Disp: , Rfl:    Calcium Carbonate Antacid (TUMS  PO), Take 2 tablets by mouth as needed (when eating dairy)., Disp: , Rfl:    carvedilol (COREG) 12.5 MG tablet, Take 2 tablets (25 mg total) by mouth 2 (two) times daily with a meal. (Patient taking differently: Take 12.5 mg by mouth daily. Only once a day in the evening), Disp: , Rfl:    Methoxy PEG-Epoetin Beta (MIRCERA IJ), Inject into the skin., Disp: , Rfl:    multivitamin (RENA-VIT) TABS tablet, Take 1 tablet by mouth at bedtime., Disp: 30 tablet, Rfl: 1   ofloxacin (OCUFLOX) 0.3 % ophthalmic solution, Place 1 drop into the left eye 4 (four) times daily., Disp: , Rfl:    predniSONE (DELTASONE) 5 MG tablet, Take 5 mg by mouth daily with breakfast., Disp: , Rfl:    tacrolimus (PROGRAF) 1 MG capsule, Take 1 mg by mouth 2 (two) times daily., Disp: , Rfl:    pantoprazole (PROTONIX) 40 MG tablet, TAKE 1 TABLET (40 MG TOTAL) BY  MOUTH 2 (TWO) TIMES DAILY BEFORE A MEAL. (Patient taking differently: Take 40 mg by mouth 2 (two) times daily before a meal.), Disp: 60 tablet, Rfl: 0  Review of Systems: + cough, leg swelling Denies appetite changes, fevers, chills, fatigue, unexplained weight changes. Denies hearing loss, neck lumps or masses, mouth sores, ringing in ears or voice changes. Denies wheezing.  Denies shortness of breath. Denies chest pain or palpitations.  Denies abdominal distention, pain, blood in stools, constipation, diarrhea, nausea, vomiting, or early satiety. Denies pain with intercourse, dysuria, frequency, hematuria or incontinence. Denies hot flashes, pelvic pain, vaginal bleeding or vaginal discharge.   Denies joint pain, back pain or muscle pain/cramps. Denies itching, rash, or wounds. Denies dizziness, headaches, numbness or seizures. Denies swollen lymph nodes or glands, denies easy bruising or bleeding. Denies anxiety, depression, confusion, or decreased concentration.  Physical Exam: BP (!) 140/88 Comment: manual recheck  Pulse 99   Temp 98.1 F (36.7 C) (Oral)   Resp 20   Wt 158 lb 9.6 oz (71.9 kg)   SpO2 100%   BMI 26.39 kg/m  General: Alert, oriented, no acute distress. HEENT: Normocephalic, atraumatic, sclera anicteric. Chest: Clear to auscultation bilaterally.  No wheezes or rhonchi. Cardiovascular: Regular rate and rhythm, no murmurs. Abdomen: soft, nontender.  Normoactive bowel sounds.  No masses or hepatosplenomegaly appreciated.   Extremities: Grossly normal range of motion.  Warm, well perfused.  2-3+ edema of right leg with erythema of the right lower extremity.  Mild erythema of the inner right thigh. Skin: No rashes or lesions noted. Lymphatics: No cervical, supraclavicular adenopathy.  No left inguinal adenopathy GU:  2+ edema of mons. External female genitalia with similar appearance from last visit although the size of her open wound has continued to decrease some.   On palpation, there is a small area where bone can be palpated at the base of this open wound.  She continues to have an open wound along the right groin with complete lack of labia majora and minora on that side.  There is exudate over the base of the wound, perhaps more than at her last exam.  Along the inferior aspect both laterally and medially, there are several condylomatous lesions that are new since her last exam.  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  There is continued edema of the left labia, similar.  Urethra is normal in appearance.  There is a small amount of fibrinous exudate over bone within her open wound, no purulence or bleeding.   Radiation changes of the  vulva noted with significant edema.  Groin biopsy procedure Preoperative diagnosis: Groin biopsy, right Postoperative diagnosis: Same as above Physician: Pricilla Holm MD Estimated blood loss: Minimal Specimens: groin biopsy Procedure: After the procedure was discussed with the patient including risks and benefits, she gave verbal consent.  The patient was already in dorsolithotomy position.  The area to be biopsied at the medial aspect of her open wound was cleansed with Betadine x 3.  1.5 cc of 2% lidocaine was then injected for local anesthesia.  Tischler forceps were used to take a small biopsy which was placed in formalin.  Silver nitrate was used to achieve hemostasis.  Overall the patient tolerated the procedure well.  All instruments were removed from the vagina.  Laboratory & Radiologic Studies: None new  Assessment & Plan: Christina Mack is a 58 y.o. woman with a history of stage IIIC vulvar cancer presenting for surveillance visit today. Completed treatment 01/2020. Completed hyperbaric oxygen therapy for chronic right inguinal wound. 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   Patient is overall doing well.  Some changes on exam  today which appear condylomatous in nature although I suspect they are are related to worsening right lower extremity edema.  Biopsy performed today given history of cancer.  She continues to follow with plastic surgery and Ortho oncology at [facility] with possible large reconstruction surgery in the future.   We discussed plan for follow-up visit in 4 months, exam at that time.  20 minutes of total time was spent for this patient encounter, including preparation, face-to-face counseling with the patient and coordination of care, and documentation of the encounter.  Christina Garnet, MD  Division of Gynecologic Oncology  Department of Obstetrics and Gynecology  [facility]

## 2023-05-17 NOTE — Patient Instructions (Signed)
I will call you with your allergy results, likely early next week.  I will see you back for follow-up in 4 months.  Please do not hesitate to reach out if you need anything before then.

## 2023-05-21 ENCOUNTER — Encounter: Payer: Self-pay | Admitting: Gynecologic Oncology

## 2023-05-21 LAB — SURGICAL PATHOLOGY

## 2023-06-05 ENCOUNTER — Encounter (HOSPITAL_COMMUNITY): Payer: Self-pay

## 2023-06-05 ENCOUNTER — Emergency Department (HOSPITAL_COMMUNITY)

## 2023-06-05 ENCOUNTER — Emergency Department (HOSPITAL_COMMUNITY)
Admission: EM | Admit: 2023-06-05 | Discharge: 2023-06-06 | Disposition: A | Discharge status: Home / Self Care | Attending: Emergency Medicine | Admitting: Emergency Medicine

## 2023-06-05 DIAGNOSIS — N186 End stage renal disease: Secondary | ICD-10-CM | POA: Diagnosis not present

## 2023-06-05 DIAGNOSIS — J069 Acute upper respiratory infection, unspecified: Secondary | ICD-10-CM | POA: Diagnosis not present

## 2023-06-05 DIAGNOSIS — I120 Hypertensive chronic kidney disease with stage 5 chronic kidney disease or end stage renal disease: Secondary | ICD-10-CM | POA: Diagnosis not present

## 2023-06-05 DIAGNOSIS — R059 Cough, unspecified: Secondary | ICD-10-CM | POA: Diagnosis present

## 2023-06-05 DIAGNOSIS — R0602 Shortness of breath: Secondary | ICD-10-CM | POA: Diagnosis not present

## 2023-06-05 DIAGNOSIS — Z992 Dependence on renal dialysis: Secondary | ICD-10-CM | POA: Diagnosis not present

## 2023-06-05 LAB — CBC WITH DIFFERENTIAL/PLATELET
Abs Immature Granulocytes: 0.04 10*3/uL (ref 0.00–0.07)
Basophils Absolute: 0 10*3/uL (ref 0.0–0.1)
Basophils Relative: 0 %
Eosinophils Absolute: 0.1 10*3/uL (ref 0.0–0.5)
Eosinophils Relative: 2 %
HCT: 23.9 % — ABNORMAL LOW (ref 36.0–46.0)
Hemoglobin: 7.7 g/dL — ABNORMAL LOW (ref 12.0–15.0)
Immature Granulocytes: 1 %
Lymphocytes Relative: 22 %
Lymphs Abs: 1.8 10*3/uL (ref 0.7–4.0)
MCH: 30.1 pg (ref 26.0–34.0)
MCHC: 32.2 g/dL (ref 30.0–36.0)
MCV: 93.4 fL (ref 80.0–100.0)
Monocytes Absolute: 0.7 10*3/uL (ref 0.1–1.0)
Monocytes Relative: 9 %
Neutro Abs: 5.2 10*3/uL (ref 1.7–7.7)
Neutrophils Relative %: 66 %
Platelets: 171 10*3/uL (ref 150–400)
RBC: 2.56 MIL/uL — ABNORMAL LOW (ref 3.87–5.11)
RDW: 16.5 % — ABNORMAL HIGH (ref 11.5–15.5)
WBC: 7.9 10*3/uL (ref 4.0–10.5)
nRBC: 0 % (ref 0.0–0.2)

## 2023-06-05 LAB — BASIC METABOLIC PANEL
Anion gap: 15 (ref 5–15)
BUN: 42 mg/dL — ABNORMAL HIGH (ref 6–20)
CO2: 24 mmol/L (ref 22–32)
Calcium: 10.1 mg/dL (ref 8.9–10.3)
Chloride: 92 mmol/L — ABNORMAL LOW (ref 98–111)
Creatinine, Ser: 8.43 mg/dL — ABNORMAL HIGH (ref 0.44–1.00)
GFR, Estimated: 5 mL/min — ABNORMAL LOW (ref >60)
Glucose, Bld: 149 mg/dL — ABNORMAL HIGH (ref 70–99)
Potassium: 4.4 mmol/L (ref 3.5–5.1)
Sodium: 131 mmol/L — ABNORMAL LOW (ref 135–145)

## 2023-06-05 LAB — BRAIN NATRIURETIC PEPTIDE: B Natriuretic Peptide: 33.4 pg/mL (ref 0.0–100.0)

## 2023-06-05 NOTE — ED Provider Triage Note (Signed)
 Emergency Medicine Provider Triage Evaluation Note  Christina Mack , a 58 y.o. female  was evaluated in triage.  Pt complains of cough wheezing shortness of breath, history of asthma, outpatient chest x-ray done at [facility] suggested fluid on her lungs, no new peripheral edema, history of chronic lymphedema of the left lower extremity.  Review of Systems  Positive: Cough wheezing Negative: Chest pain  Physical Exam  BP (!) 170/97 (BP Location: Right Arm)   Pulse (!) 110   Temp 98.5 F (36.9 C)   Resp 18   SpO2 97%  Gen:   Awake, no distress   Resp:  Normal effort  MSK:   Moves extremities without difficulty  Other:    Medical Decision Making  Medically screening exam initiated at 6:09 PM.  Appropriate orders placed.  SAMHITHA ROSEN was informed that the remainder of the evaluation will be completed by another provider, this initial triage assessment does not replace that evaluation, and the importance of remaining in the ED until their evaluation is complete.     Arthor Captain, PA-C 06/05/23 1810

## 2023-06-05 NOTE — ED Triage Notes (Signed)
 Pt is coming in for a cough that has been going on x 1 week, she was tested for covid/flu, she mentions having a lot of mucus production, so she went to the UC today who did a chest x-ray that potentially showed fluid on her lungs. She mentions no chest pain at this time. 1 reported fever at home which was controlled with tylenol and has no fevers since

## 2023-06-06 ENCOUNTER — Other Ambulatory Visit: Payer: Self-pay

## 2023-06-06 DIAGNOSIS — J069 Acute upper respiratory infection, unspecified: Secondary | ICD-10-CM | POA: Diagnosis not present

## 2023-06-06 LAB — RESP PANEL BY RT-PCR (RSV, FLU A&B, COVID)  RVPGX2
Influenza A by PCR: NEGATIVE
Influenza B by PCR: NEGATIVE
Resp Syncytial Virus by PCR: NEGATIVE
SARS Coronavirus 2 by RT PCR: NEGATIVE

## 2023-06-06 MED ORDER — PREDNISONE 10 MG PO TABS: ORAL_TABLET | ORAL | 0 refills | Status: AC

## 2023-06-06 MED ORDER — BENZONATATE 100 MG PO CAPS: 100.0000 mg | ORAL_CAPSULE | Freq: Three times a day (TID) | ORAL | 0 refills | Status: DC

## 2023-06-06 NOTE — ED Provider Notes (Signed)
 [place] EMERGENCY DEPARTMENT AT [facility] Provider Note   CSN: [id number] Arrival date & time: 06/05/23  1737     History  Chief Complaint  Patient presents with   Cough    Christina Mack is a 58 y.o. 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  States she is coughing up yellow mucus.  She denies any sore throat or chills.  Reports 1 fever at home which resolved with Tylenol, and has not had any fever since.  She states she was at urgent care earlier today and they took a chest x-ray and were concerned that she may have some fluid on her lungs, so sent her here for further evaluation.  She denies any chest pain or shortness of breath.  She reports she is been in the creatinines of 8  at home which is at her baseline.    Cough      Home Medications Prior to Admission medications   Medication Sig Start Date End Date Taking? Authorizing Provider  benzonatate (TESSALON) 100 MG capsule Take 1 capsule (100 mg total) by mouth every 8 (eight) hours. 06/06/23  Yes Arabella Merles, PA-C  predniSONE (DELTASONE) 10 MG tablet Take 4 tablets (40 mg total) by mouth daily with breakfast for 2 days, THEN 3 tablets (30 mg total) daily with breakfast for 2 days, THEN 2 tablets (20 mg total) daily with breakfast for 2 days, THEN 1 tablet (10 mg total) daily with breakfast for 1 day. 06/06/23 06/13/23 Yes Arabella Merles, PA-C  calcitRIOL (ROCALTROL) 0.5 MCG capsule Take by mouth. 10/23/22   [provider]  Calcium Carbonate Antacid (TUMS PO) Take 2 tablets by mouth as needed (when eating dairy).    [provider]  carvedilol (COREG) 12.5 MG tablet Take 2 tablets (25 mg total) by mouth 2 (two) times daily with a meal. Patient taking differently: Take 12.5 mg by mouth daily. Only once a day in the evening 05/29/22   Joseph Art, DO  Methoxy PEG-Epoetin Beta (MIRCERA IJ)  Inject into the skin. 10/30/22   [provider]  multivitamin (RENA-VIT) TABS tablet Take 1 tablet by mouth at bedtime. 09/30/22   Jonah Blue, MD  ofloxacin (OCUFLOX) 0.3 % ophthalmic solution Place 1 drop into the left eye 4 (four) times daily. 04/10/23   [provider]  pantoprazole (PROTONIX) 40 MG tablet TAKE 1 TABLET (40 MG TOTAL) BY MOUTH 2 (TWO) TIMES DAILY BEFORE A MEAL. Patient taking differently: Take 40 mg by mouth 2 (two) times daily before a meal. 05/17/20 09/27/22  Love, Evlyn Kanner, PA-C  tacrolimus (PROGRAF) 1 MG capsule Take 1 mg by mouth 2 (two) times daily. 04/12/22   [provider]      Allergies    Zosyn [piperacillin sod-tazobactam so], Hydromorphone, Amlodipine, Codeine, Hydralazine, and Zyvox [linezolid]    Review of Systems   Review of Systems  Respiratory:  Positive for cough.     Physical Exam Updated Vital Signs BP (!) 140/97   Pulse 100   Temp 98.6 F (37 C) (Oral)   Resp 18   SpO2 100%  Physical Exam Vitals and nursing note reviewed.  Constitutional:      General: She is not in acute distress.    Appearance: She is well-developed.  HENT:     Head: Normocephalic and atraumatic.  Eyes:     Conjunctiva/sclera: Conjunctivae normal.  Cardiovascular:     Rate and Rhythm: Normal rate and regular rhythm.     Heart sounds: No murmur heard. Pulmonary:     Effort: Pulmonary effort is normal. No respiratory distress.     Breath sounds: Normal breath sounds.     Comments: Cough noted on exam.  Breathing comfortably on room air. Lungs clear to auscultation Abdominal:     Palpations: Abdomen is soft.     Tenderness: There is no abdominal tenderness.  Musculoskeletal:        General: No swelling.     Cervical back: Neck supple.  Skin:    General: Skin is warm and dry.     Capillary Refill: Capillary refill takes less than 2 seconds.  Neurological:     Mental Status: She is alert.  Psychiatric:        Mood and Affect: Mood  normal.     ED Results / Procedures / Treatments   Labs (all labs ordered are listed, but only abnormal results are displayed) Labs Reviewed  BASIC METABOLIC PANEL - Abnormal; Notable for the following components:      Result Value   Sodium 131 (*)    Chloride 92 (*)    Glucose, Bld 149 (*)    BUN 42 (*)    Creatinine, Ser 8.43 (*)    GFR, Estimated 5 (*)    All other components within normal limits  CBC WITH DIFFERENTIAL/PLATELET - Abnormal; Notable for the following components:   RBC 2.56 (*)    Hemoglobin 7.7 (*)    HCT 23.9 (*)    RDW 16.5 (*)    All other components within normal limits  RESP PANEL BY RT-PCR (RSV, FLU A&B, COVID)  RVPGX2  BRAIN NATRIURETIC PEPTIDE    EKG None  Radiology DG Chest 2 View Result Date: 06/05/2023 CLINICAL DATA:  Cough x1 week. EXAM: CHEST - 2 VIEW COMPARISON:  November 07, 2022 FINDINGS: There is stable right-sided venous catheter positioning. The heart size and mediastinal contours are within normal limits. Both lungs are clear. The visualized skeletal structures are unremarkable. IMPRESSION: No active cardiopulmonary disease. Electronically Signed   By: Aram Candela M.D.   On: 06/05/2023 20:33    Procedures Procedures    Medications Ordered in ED Medications - No data to display  ED Course/ Medical Decision Making/ A&P Clinical Course as of 06/06/23 0545  Wed Jun 06, 2023  0355 DG Chest 2 View [JS]    Clinical Course User Index [JS] Arletha Pili, DO                                 Medical Decision Making Amount and/or Complexity of Data Reviewed Radiology:  Decision-making details documented in ED Course.     Differential diagnosis includes but is not limited to COVID, flu, RSV, viral URI, strep pharyngitis, viral pharyngitis, allergic rhinitis, pneumonia, bronchitis   ED Course:  Patient well-appearing, stable vital signs aside from a slightly elevated blood pressure of 140/88.  Slight tachycardia to 101.  She  reports productive cough for the past 2 weeks.  Chest x-ray without signs of pneumonia.  There is no evidence of pleural effusion or other sort of fluid, unclear what the chest x-ray at urgent care showed.  Her COVID, flu, and RSV are negative.  Suspect she has bronchitis vs viral URI.  Her only symptom is cough, no chest pain, shortness of breath, no  recent periods of immobilization, low concern for PE at this time. Discussed that we can try a prednisone taper and Tessalon Perles for symptoms at home.  She would like to try this.  Patient is on dialysis, reports her last creatinine on labs last week was at 8 and has been for several months. This is her baseline and she follows with [facility]. No other electrolyte abnormalities.  No indication for hospitalization at this time. I Ordered, and personally interpreted labs.  The pertinent results include:   CBC without leukocytosis.  Hemoglobin at 7.7 which is at patient's baseline BMP with hyponatremia 131, creatinine 8.43 BNP within normal limits COVID, flu, RSV negative  Patient stable and appropriate for discharge home  Impression: Bronchitis  Disposition:  The patient was discharged home with instructions to take tessalon perles as needed for cough. Take prednisone taper as prescribed.  Follow-up with PCP if symptoms not improving within the next week. Return precautions given.  Imaging Studies ordered: I ordered imaging studies including chest x-ray I independently visualized the imaging with scope of interpretation limited to determining acute life threatening conditions related to emergency care. Imaging showed no acute abnormality I agree with the radiologist interpretation   Cardiac Monitoring: / EKG: The patient was maintained on a cardiac monitor.  I personally viewed and interpreted the cardiac monitored which showed an underlying rhythm of: Normal sinus rhythm, no ST changes              Final Clinical Impression(s) / ED  Diagnoses Final diagnoses:  Viral URI with cough    Rx / DC Orders ED Discharge Orders          Ordered    predniSONE (DELTASONE) 10 MG tablet  Q breakfast        06/06/23 0540    benzonatate (TESSALON) 100 MG capsule  Every 8 hours        06/06/23 0540              Arabella Merles, PA-C 06/06/23 0545    Anders Simmonds T, DO 06/06/23 [phone]

## 2023-06-06 NOTE — Discharge Instructions (Addendum)
 Your chest x-ray here today did not show any abnormalities.  There was no fluid noted, it is unclear what the urgent care x-ray saw.  Included the chest x-ray result below for your reference.  Your flu, COVID, and RSV is negative.  Your chest x-ray does not show any signs of pneumonia.  You have a slightly low sodium here today at 131.  Your hemoglobin which is one of your blood counts was low at 7.7, but this appears to be at your baseline.  Your creatinine was elevated today at 8.43.  As discussed, since this is your baseline, we will have you continue with your dialysis as scheduled at home.  Please follow-up with your PCP if symptoms are not improving within the next week.  Return to the ER for any chest pain, shortness of breath, unexplained fevers, any other new or concerning symptoms.   " Narrative & Impression  CLINICAL DATA:  Cough x1 week.   EXAM: CHEST - 2 VIEW   COMPARISON:  November 07, 2022   FINDINGS: There is stable right-sided venous catheter positioning. The heart size and mediastinal contours are within normal limits. Both lungs are clear. The visualized skeletal structures are unremarkable.   IMPRESSION: No active cardiopulmonary disease.     Electronically Signed   By: Aram Candela M.D.   On: 06/05/2023 20:33  "

## 2023-06-27 ENCOUNTER — Telehealth: Payer: Self-pay

## 2023-06-27 NOTE — Telephone Encounter (Signed)
 Spoke with pt regarding her previous LUE AVF vs AVG surgery order/form. She has recently been given a referral for a transplant and is holding off on surgery but will call us if that changes and she needs to be seen again.

## 2023-08-22 ENCOUNTER — Telehealth: Payer: Self-pay

## 2023-08-22 NOTE — Telephone Encounter (Signed)
 Rescheduled patient from 6/13 to 7/10 @ 2:15pm... Dr Orvil Bland not in office on 6/13

## 2023-09-14 ENCOUNTER — Ambulatory Visit: Payer: Medicare Other | Admitting: Gynecologic Oncology

## 2023-09-26 ENCOUNTER — Encounter: Payer: Self-pay | Admitting: Infectious Diseases

## 2023-09-26 ENCOUNTER — Ambulatory Visit: Admitting: Infectious Diseases

## 2023-09-26 ENCOUNTER — Other Ambulatory Visit: Payer: Self-pay

## 2023-09-26 VITALS — BP 144/83 | HR 97 | Temp 98.6°F | Ht 65.0 in | Wt 166.4 lb

## 2023-09-26 DIAGNOSIS — M4628 Osteomyelitis of vertebra, sacral and sacrococcygeal region: Secondary | ICD-10-CM

## 2023-09-26 DIAGNOSIS — Z992 Dependence on renal dialysis: Secondary | ICD-10-CM

## 2023-09-26 DIAGNOSIS — R300 Dysuria: Secondary | ICD-10-CM

## 2023-09-26 DIAGNOSIS — N186 End stage renal disease: Secondary | ICD-10-CM

## 2023-09-26 DIAGNOSIS — I890 Lymphedema, not elsewhere classified: Secondary | ICD-10-CM | POA: Diagnosis not present

## 2023-09-26 DIAGNOSIS — B029 Zoster without complications: Secondary | ICD-10-CM

## 2023-09-26 MED ORDER — CEFADROXIL 500 MG PO CAPS: 500.0000 mg | ORAL_CAPSULE | Freq: Every evening | ORAL | 5 refills | Status: DC

## 2023-09-26 MED ORDER — GABAPENTIN 100 MG PO CAPS: 100.0000 mg | ORAL_CAPSULE | Freq: Three times a day (TID) | ORAL | 0 refills | Status: DC

## 2023-09-26 MED ORDER — VALACYCLOVIR HCL 500 MG PO TABS
500.0000 mg | ORAL_TABLET | Freq: Every evening | ORAL | 0 refills | Status: AC
Start: 2023-09-26 — End: 2023-10-03

## 2023-09-26 NOTE — Patient Instructions (Addendum)
 START valtrex (antiviral) once a day - will continue this for 7 days - the goal is the blisters will all rupture and crust over during this time.   START gabapentin 100 mg capsules - can take one every 8 hours - this is for the nerve pain  Can try lidocaine  patches after the blisters open up and crust down. Don't put on actively inflamed skin rash.   I sent in refills of the cefadroxil  for you for on demand treatment of the cellulitis.

## 2023-09-26 NOTE — Progress Notes (Signed)
 Patient: Christina Mack  DOB: [birth year] MRN: [id number] PCP: [facility]    Subjective:   Chief Complaint  Patient presents with   Follow-up      HPI:  Discussed the use of AI scribe software for clinical note transcription with the patient, who gave verbal consent to proceed.  History of Present Illness   Christina Mack is a 58 year old female who presents with a painful rash, suspected to be shingles.  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 Initially, she attributed the pain to a muscular issue after slipping in the shower. By Saturday, the pain evolved into an achy and itchy sensation, although no rash was visible at that time. This morning, she observed the rash and experienced severe back pain, describing it as unprecedented in severity. The rash is located on her belly and is very painful, with one area being less painful. She has had shingles before, which started on her shoulder and spread to her wrist.  She has a history of chronic pelvic osteomyelitis and has been off antibiotics for over a year, except for treatment of lymphedema and cellulitis. She last treated cellulitis in February and notes her hip pain has been manageable. She has experienced significant changes in her pelvic region, including loss of clitoris and shifting of tissue, which has impacted her urinary function. She suspects a possible UTI due to cloudy urine and plans to leave a urine sample for testing.  She has been experiencing sleep disturbances due to pain, unable to find a comfortable position because of the rash. She has used [facility] for relief along her spine, avoiding direct application on the rash. 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 Current Immunosuppression - Tacrolimus  1 mg BID, Prednisone  5 mg daily, off MMF given advanced CKD and c/f chronic OM.      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   10/13/21 - Linezolid  + Moxifloxacin  for worsened drainage and pain in hip with imaging concerning for progressed OM - total duration of treatment 12 weeks.   01/22/22 - Right THR s/p fracture//mechanical fall.   02/03/2022 - Post-op visit with orthopedics. Started on Linezolid  + Moxifloxacin  out of concern for right hip (caused severe myelosuppression requiring hospitalization and blood transfusions)   03/10/22 - PET-CT worsened inflammation around the right groin with progressive OM and changes to right pubic bone.   03/19/22 - bacteremic with PsA (FQ-R) tx with 6 weeks meropenem  and daptomycin . Bone scan 03/22/22 - did not demonstrate any concern for right hip prosthetic joint infection.   07/21/2022 - worsened wound, SSTI. Tx 2 weeks Vanc / Cefepime  after iHD sessions (EOT: 08/05/2022) with good result   08/08/2022 - on demand cefadroxil  rx' for recurrent cellulitis due to lymphedema.  09/28/22 - L THR after mechanical fall and fracture.   05/01/23 -  Evaluation by Dr. Ballard with orthopedic oncology @ [facility] with noted chronic would in the right vulvar pelvis region deep to the right ischial ramus. Recommended MRI w/o contrast and consultation with gynecology and plastic surgery for soft tissue coverage. Same day evaluation by Dr. Enrique with ID.   05/17/2023:  Gynecologic Oncology. Biopsy of condylomatous lesions in the right groin c/w benign skin hyperkeratosis with focal bacterial colonization. Required another tx of cellulitis of the lower leg and shin around this time.    Review of Systems  Constitutional:  Positive for chills and malaise/fatigue.  Musculoskeletal:  Positive for back pain and joint pain.  Skin:  Negative for rash.  All other systems reviewed and are negative.    Past Medical History:  Diagnosis Date   Complication of anesthesia    nausea and vomiting   Dialysis patient (HCC)    Currenlty back on dialysis   DVT (deep venous thrombosis) (HCC)    History of osteomyelitis    Kidney transplant failure    Kidney transplant recipient    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[facility])     Outpatient Medications Prior to Visit  Medication Sig Dispense Refill   calcitRIOL  (ROCALTROL ) 0.5 MCG capsule Take by mouth.     Calcium  Carbonate Antacid (TUMS PO) Take 2 tablets by mouth as needed (when eating dairy).     Methoxy PEG-Epoetin  Beta (MIRCERA IJ) Inject into the skin.     multivitamin (RENA-VIT) TABS tablet Take 1 tablet by mouth at bedtime. 30 tablet 1   pantoprazole  (PROTONIX ) 40 MG tablet TAKE 1 TABLET (40 MG TOTAL) BY MOUTH 2 (TWO) TIMES DAILY BEFORE A MEAL. 60 tablet 0   predniSONE  (DELTASONE ) 5 MG tablet Take 5 mg by mouth daily with breakfast.     tacrolimus  (PROGRAF ) 1 MG capsule Take 1 mg by mouth 2 (two) times daily.     benzonatate  (TESSALON ) 100 MG capsule Take 1 capsule (100 mg total) by mouth every 8 (eight) hours. (Patient not taking: Reported on 09/26/2023) 21 capsule 0   carvedilol  (COREG ) 12.5 MG tablet Take 2 tablets (25 mg total) by mouth 2 (two) times daily with a meal. (Patient not taking: Reported on 09/26/2023)     ofloxacin (OCUFLOX) 0.3 % ophthalmic solution Place 1 drop into the left eye 4 (four) times daily. (Patient not taking: Reported on 09/26/2023)     No facility-administered medications prior to visit.     Allergies   Allergen Reactions   Zosyn [Piperacillin Sod-Tazobactam So] Other (See Comments)    Thrombocytopenia   Hydromorphone Nausea And Vomiting, Rash and Other (See Comments)   Amlodipine  Other (See Comments) and Swelling    Ankle swelling   Codeine Other (See Comments)    vomiting   Hydralazine      Headache   Zyvox  [Linezolid ] Other (See Comments)    Decreased platelet counts   Sucroferric Oxyhydroxide Other (See Comments)    Social History   Tobacco Use   Smoking status: Never   Smokeless tobacco: Never  Vaping Use   Vaping status: Never Used  Substance Use Topics   Alcohol use: Not Currently   Drug use: Never     Objective:   Vitals:   09/26/23 1314  BP: (!) 144/83  Pulse: 97  Temp: 98.6 F (37 C)  TempSrc: Oral  SpO2: 97%  Weight: 166 lb 7.2 oz (75.5 kg)  Height: 5' 5 (1.651 m)  Body mass index is 27.7 kg/m.  Physical Exam  Musculoskeletal:        General: Swelling present.     Right lower leg: Edema present.   Skin:    General: Skin is warm.     Capillary Refill: Capillary refill takes less than 2 seconds.     Findings: Rash present. Rash is vesicular.         Comments: Vesicular erythematous rash noted along left side of rib cage down to lower abdomen       Lab Results: Lab Results  Component Value Date   WBC 7.9 06/05/2023   HGB 7.7 (L) 06/05/2023   HCT 23.9 (L) 06/05/2023   MCV 93.4 06/05/2023   PLT 171 06/05/2023    Lab Results  Component Value Date   CREATININE 8.43 (H) 06/05/2023   BUN 42 (H) 06/05/2023   NA 131 (L) 06/05/2023   K 4.4 06/05/2023   CL 92 (L) 06/05/2023   CO2 24 06/05/2023    Lab Results  Component Value Date   ALT 8 07/28/2022   AST 9 (L) 07/28/2022   ALKPHOS 67 07/28/2022   BILITOT 0.8 07/28/2022     Assessment & Plan:     Acute Shingles - Acute shingles with rash and severe pain due to reactivation of varicella-zoster virus. Rash is located on the back, causing significant pain, itching, and sleep  disruption. Previous episode on the shoulder. Discussed Valtrex for treatment and gabapentin for nerve pain. Advised to keep blisters covered to prevent transmission, especially around children. Discussed potential benefit of shingles vaccination post-resolution. - Prescribe Valtrex, adjusted for dialysis. - Prescribe gabapentin 100 mg, up to three times daily, starting at night, adjusted for renal considerations. - Advise use of lidocaine  patches after blisters crust. - Advise keeping blisters covered to prevent transmission. - Discuss shingles vaccination post-resolution.  Chronic osteomyelitis of pelvis - Chronic pelvic osteomyelitis with no recent antibiotic use except for lymphedema associated cellulitis in February. High risk for recurrence, but currently quiescent. Discussed difficulty in proving bone sterility and potential transplant risks and benefits. She is considering transplant options at different centers after being declined by [facility] due to high risk. Discussed potential for recurrence post-transplant and impact on quality of life. She is willing to risk transplant to improve life quality by discontinuing dialysis. - Discuss transplant options with other centers. - No indications for treatment today.  - Discussed changes in pain and other signs / symptoms to report should this acutely flare or become newly infected.  - A flap to cover any remaining open wound I think would be most helpful for her to prevent new infections.    End-stage renal disease on dialysis - End-stage renal disease managed with dialysis. Medication dosages need adjustment for renal considerations, particularly gabapentin and Valtrex. - Adjust gabapentin dosage for renal considerations. - Adjust Valtrex dosage for dialysis.  Lymphedema with cellulitis - Lymphedema with history of cellulitis. No recent cellulitis since February. Swelling present but not severe. Discussed importance of early cellulitis treatment  and cefadroxil  use as needed. - Prescribe cefadroxil  for on-demand use for cellulitis. - Advise early treatment of cellulitis with cefadroxil  for 14 days if needed. Treat for 7 days then reassess. Happy to help troubleshoot duration if she reaches out to me during an acute flare   Recording duration: 22 minutes         Corean Fireman, MSN, NP-C [facility] for Infectious Disease [facility]  [email or web address] Pager: [phone] Office:  [phone] [facility] Main Line: [phone] *Secure Chat Communication Welcome     09/26/23  3:06 PM

## 2023-09-27 LAB — URINALYSIS, ROUTINE W REFLEX MICROSCOPIC
Bilirubin Urine: NEGATIVE
Ketones, ur: NEGATIVE
Squamous Epithelial / HPF: NONE SEEN /HPF (ref <=5)

## 2023-09-28 LAB — URINALYSIS, ROUTINE W REFLEX MICROSCOPIC
Hyaline Cast: NONE SEEN /LPF
Nitrite: NEGATIVE
Specific Gravity, Urine: 1.013 (ref 1.001–1.035)
pH: 8 (ref 5.0–8.0)

## 2023-09-28 LAB — URINE CULTURE
MICRO NUMBER:: 16624798
SPECIMEN QUALITY:: ADEQUATE

## 2023-09-28 LAB — MICROSCOPIC MESSAGE

## 2023-09-28 MED ORDER — FOSFOMYCIN TROMETHAMINE 3 G PO PACK: 3.0000 g | PACK | Freq: Once | ORAL | 0 refills | Status: AC

## 2023-09-30 ENCOUNTER — Emergency Department (HOSPITAL_COMMUNITY)
Admission: EM | Admit: 2023-09-30 | Discharge: 2023-09-30 | Disposition: A | Discharge status: Home / Self Care | Attending: Emergency Medicine | Admitting: Emergency Medicine

## 2023-09-30 ENCOUNTER — Other Ambulatory Visit: Payer: Self-pay

## 2023-09-30 DIAGNOSIS — Z940 Kidney transplant status: Secondary | ICD-10-CM | POA: Insufficient documentation

## 2023-09-30 DIAGNOSIS — T50905A Adverse effect of unspecified drugs, medicaments and biological substances, initial encounter: Secondary | ICD-10-CM

## 2023-09-30 DIAGNOSIS — T426X5A Adverse effect of other antiepileptic and sedative-hypnotic drugs, initial encounter: Secondary | ICD-10-CM | POA: Diagnosis not present

## 2023-09-30 DIAGNOSIS — R202 Paresthesia of skin: Secondary | ICD-10-CM | POA: Insufficient documentation

## 2023-09-30 DIAGNOSIS — B029 Zoster without complications: Secondary | ICD-10-CM | POA: Diagnosis not present

## 2023-09-30 DIAGNOSIS — T375X5A Adverse effect of antiviral drugs, initial encounter: Secondary | ICD-10-CM | POA: Diagnosis not present

## 2023-09-30 DIAGNOSIS — Z5329 Procedure and treatment not carried out because of patient's decision for other reasons: Secondary | ICD-10-CM | POA: Diagnosis not present

## 2023-09-30 DIAGNOSIS — N186 End stage renal disease: Secondary | ICD-10-CM | POA: Diagnosis not present

## 2023-09-30 DIAGNOSIS — Z992 Dependence on renal dialysis: Secondary | ICD-10-CM | POA: Insufficient documentation

## 2023-09-30 LAB — CBC WITH DIFFERENTIAL/PLATELET
Abs Immature Granulocytes: 0.03 10*3/uL (ref 0.00–0.07)
Basophils Absolute: 0 10*3/uL (ref 0.0–0.1)
Basophils Relative: 0 %
Eosinophils Absolute: 0.2 10*3/uL (ref 0.0–0.5)
Eosinophils Relative: 4 %
HCT: 24 % — ABNORMAL LOW (ref 36.0–46.0)
Hemoglobin: 7.4 g/dL — ABNORMAL LOW (ref 12.0–15.0)
Immature Granulocytes: 1 %
Lymphocytes Relative: 22 %
Lymphs Abs: 1 10*3/uL (ref 0.7–4.0)
MCH: 29.6 pg (ref 26.0–34.0)
MCHC: 30.8 g/dL (ref 30.0–36.0)
MCV: 96 fL (ref 80.0–100.0)
Monocytes Absolute: 0.3 10*3/uL (ref 0.1–1.0)
Monocytes Relative: 7 %
Neutro Abs: 3 10*3/uL (ref 1.7–7.7)
Neutrophils Relative %: 66 %
Platelets: 137 10*3/uL — ABNORMAL LOW (ref 150–400)
RBC: 2.5 MIL/uL — ABNORMAL LOW (ref 3.87–5.11)
RDW: 16.8 % — ABNORMAL HIGH (ref 11.5–15.5)
WBC: 4.5 10*3/uL (ref 4.0–10.5)
nRBC: 0 % (ref 0.0–0.2)

## 2023-09-30 LAB — COMPREHENSIVE METABOLIC PANEL WITH GFR
ALT: 9 U/L (ref 0–44)
AST: 12 U/L — ABNORMAL LOW (ref 15–41)
Albumin: 3 g/dL — ABNORMAL LOW (ref 3.5–5.0)
Alkaline Phosphatase: 66 U/L (ref 38–126)
Anion gap: 18 — ABNORMAL HIGH (ref 5–15)
BUN: 41 mg/dL — ABNORMAL HIGH (ref 6–20)
CO2: 22 mmol/L (ref 22–32)
Calcium: 9.3 mg/dL (ref 8.9–10.3)
Chloride: 91 mmol/L — ABNORMAL LOW (ref 98–111)
Creatinine, Ser: 8.59 mg/dL — ABNORMAL HIGH (ref 0.44–1.00)
GFR, Estimated: 5 mL/min — ABNORMAL LOW (ref >60)
Glucose, Bld: 129 mg/dL — ABNORMAL HIGH (ref 70–99)
Potassium: 3.9 mmol/L (ref 3.5–5.1)
Sodium: 131 mmol/L — ABNORMAL LOW (ref 135–145)
Total Bilirubin: 0.7 mg/dL (ref 0.0–1.2)
Total Protein: 7.6 g/dL (ref 6.5–8.1)

## 2023-09-30 LAB — URINALYSIS, ROUTINE W REFLEX MICROSCOPIC
Bacteria, UA: NONE SEEN
Bilirubin Urine: NEGATIVE
Glucose, UA: 50 mg/dL — AB
Ketones, ur: NEGATIVE mg/dL
Nitrite: NEGATIVE
Protein, ur: 100 mg/dL — AB
Specific Gravity, Urine: 1.008 (ref 1.005–1.030)
pH: 9 — ABNORMAL HIGH (ref 5.0–8.0)

## 2023-09-30 LAB — TROPONIN I (HIGH SENSITIVITY): Troponin I (High Sensitivity): 4 ng/L (ref <18)

## 2023-09-30 NOTE — ED Triage Notes (Signed)
 Patient arrives in wheelchair by POV states she does not feel like herself and has tingling from head to toe. States she is on medication for shingles. Reports having diarrhea onset of yesterday. States she started on gabapentin  Wednesday and is unsure if this is a reaction.

## 2023-09-30 NOTE — ED Notes (Signed)
 Patient left AMA, EDP with another patient at this time.

## 2023-09-30 NOTE — ED Provider Triage Note (Cosign Needed)
 Emergency Medicine Provider Triage Evaluation Note  Christina Mack , a 58 y.o. female  was evaluated in triage.  Pt complains of not feeling myself reports that she recently began medication such as valacyclovir , gabapentin  and Wednesday, has been taking this medication for the past 3 days.  Yesterday she took a medication for UTI, she said the medication such as fosfomycin.  Reports she has had diarrhea episodes since this was taking.  She feels tingling from her head all the way down to her toes.  She does have a currently outbreak of shingles.  Review of Systems  Positive: tingling Negative: Chest pain, shortness of breath, chest pain, nausea, vomiting  Physical Exam  There were no vitals taken for this visit. Gen:   Awake, no distress   Resp:  Normal effort  MSK:   Moves extremities without difficulty  Other:  Rash to the left abdomen, flank consistent with shingles.  Medical Decision Making  Medically screening exam initiated at 1:25 PM.  Appropriate orders placed.  TONIANNE FINE was informed that the remainder of the evaluation will be completed by another provider, this initial triage assessment does not replace that evaluation, and the importance of remaining in the ED until their evaluation is complete.     Kariyah Baugh, PA-C 09/30/23 1330

## 2023-09-30 NOTE — ED Provider Notes (Signed)
 [place] [facility] AT [facility] Provider Note   CSN: [id number] Arrival date & time: 09/30/23  1308     Patient presents with: Medication Reaction   Christina Mack is a 58 y.o. female.  With a past history of status post renal transplant, ESRD on home dialysis and herpes zoster who presents to the ED given concern for medication reaction.  Patient recently diagnosed with herpes zoster rash over the left flank and abdomen.  Treated with valacyclovir  and gabapentin  which she started a couple days ago.  Since that time has been experiencing general malaise and tingling all over her body.  No focal weakness headaches nausea vomiting diarrhea fevers chills or other complaint at this time.  No zoster involvement of the face eye or ear   HPI     Prior to Admission medications   Medication Sig Start Date End Date Taking? Authorizing Provider  acetaminophen  (TYLENOL ) 500 MG tablet Take 1,000 mg by mouth every 6 (six) hours as needed for moderate pain (pain score 4-6), fever or headache.   Yes [provider]  albuterol  (VENTOLIN  HFA) 108 (90 Base) MCG/ACT inhaler Inhale 2 puffs into the lungs every 6 (six) hours as needed for wheezing or shortness of breath.   Yes [provider]  calcitRIOL  (ROCALTROL ) 0.5 MCG capsule Take 0.5 mcg by mouth every Monday, Wednesday, and Friday at 8 PM. 10/23/22  Yes [provider]  gabapentin  (NEURONTIN ) 100 MG capsule Take 1 capsule (100 mg total) by mouth 3 (three) times daily. Patient taking differently: Take 100 mg by mouth every Monday, Wednesday, and Friday at 8 PM. 09/26/23  Yes Dixon, Corean SAILOR, NP  pantoprazole  (PROTONIX ) 40 MG tablet TAKE 1 TABLET (40 MG TOTAL) BY MOUTH 2 (TWO) TIMES DAILY BEFORE A MEAL. 05/17/20 11/10/23 Yes Love, Sharlet GORMAN, PA-C  predniSONE  (DELTASONE ) 5 MG tablet Take 5 mg by mouth daily with breakfast. 08/26/19  Yes [provider]  tacrolimus  (PROGRAF ) 1 MG capsule Take 1 mg by  mouth 2 (two) times daily. 04/12/22  Yes [provider]  valACYclovir  (VALTREX ) 500 MG tablet Take 1 tablet (500 mg total) by mouth every evening for 7 days. On HD days take AFTER session 09/26/23 10/03/23 Yes Melvenia Corean SAILOR, NP  cefadroxil  (DURICEF) 500 MG capsule Take 1 capsule (500 mg total) by mouth every evening. After dialysis. Take 7 - 14 days depending on how the cellulitis responds. Patient not taking: Reported on 09/30/2023 09/26/23   Melvenia Corean SAILOR, NP  fosfomycin (MONUROL) 3 g PACK Take 3 g by mouth once. Patient not taking: Reported on 09/30/2023    [provider]  multivitamin (RENA-VIT) TABS tablet Take 1 tablet by mouth at bedtime. Patient not taking: Reported on 09/30/2023 09/30/22   Barbarann Nest, MD  sevelamer carbonate (RENVELA) 800 MG tablet Take 800 mg by mouth 3 (three) times daily with meals.    [provider]    Allergies: Zosyn [piperacillin sod-tazobactam so], Dilaudid [hydromorphone], Apresoline  [hydralazine ], Codeine, Norvasc  [amlodipine ], Zyvox  [linezolid ], and Velphoro [sucroferric oxyhydroxide]    Review of Systems  Updated Vital Signs BP (!) 164/105   Pulse 89   Temp 98.4 F (36.9 C) (Oral)   Resp 13   Ht 5' 5 (1.651 m)   Wt 75.5 kg   SpO2 100%   BMI 27.70 kg/m   Physical Exam Vitals and nursing note reviewed.  HENT:     Head: Normocephalic and atraumatic.   Eyes:  Pupils: Pupils are equal, round, and reactive to light.    Cardiovascular:     Rate and Rhythm: Normal rate and regular rhythm.  Pulmonary:     Effort: Pulmonary effort is normal.     Breath sounds: Normal breath sounds.  Abdominal:     Palpations: Abdomen is soft.     Tenderness: There is no abdominal tenderness.   Skin:    General: Skin is warm and dry.     Comments: Vesicular rash over left flank left abdomen in a dermatomal distribution   Neurological:     Mental Status: She is alert.   Psychiatric:        Mood and Affect: Mood  normal.     (all labs ordered are listed, but only abnormal results are displayed) Labs Reviewed  CBC WITH DIFFERENTIAL/PLATELET - Abnormal; Notable for the following components:      Result Value   RBC 2.50 (*)    Hemoglobin 7.4 (*)    HCT 24.0 (*)    RDW 16.8 (*)    Platelets 137 (*)    All other components within normal limits  COMPREHENSIVE METABOLIC PANEL WITH GFR - Abnormal; Notable for the following components:   Sodium 131 (*)    Chloride 91 (*)    Glucose, Bld 129 (*)    BUN 41 (*)    Creatinine, Ser 8.59 (*)    Albumin 3.0 (*)    AST 12 (*)    GFR, Estimated 5 (*)    Anion gap 18 (*)    All other components within normal limits  URINALYSIS, ROUTINE W REFLEX MICROSCOPIC - Abnormal; Notable for the following components:   pH 9.0 (*)    Glucose, UA 50 (*)    Hgb urine dipstick SMALL (*)    Protein, ur 100 (*)    Leukocytes,Ua MODERATE (*)    All other components within normal limits  TROPONIN I (HIGH SENSITIVITY)    EKG: None  Radiology: No results found.   Procedures   Medications Ordered in the ED - No data to display  Clinical Course as of 09/30/23 1826  Sun Sep 30, 2023  1826 Laboratory workup unremarkable overall.  Labs at current baseline including hemoglobin.  Has a troponin of 4 not consistent with ACS.  UA appears improved from earlier this week.  Patient will follow-up with PCP.  She left prior to formal discharge [MP]    Clinical Course User Index [MP] Pamella Ozell LABOR, DO                                 Medical Decision Making 58 year old female with history as above presenting given concern for adverse effect of medication.  Started on gabapentin  and valacyclovir  a couple days ago for zoster rash.  Has been feeling tingly and not like herself.  Compliant with home dialysis.  Afebrile slightly hypertensive here.  Well-appearing.  Does have a zoster rash over the left abdomen and flank.  No involvement of the face head or neck.  Suspect this  may be an adverse reaction to gabapentin  but will obtain laboratory workup to ensure CBC metabolic panel look okay.  Will also obtain EKG to look for evidence of dysrhythmia.  If her workup looks okay suspect she can go home with close PCP follow-up to discuss alternative therapies        Final diagnoses:  Adverse effect of drug, initial encounter  ED Discharge Orders     None          Pamella Ozell LABOR, DO 09/30/23 1826

## 2023-09-30 NOTE — ED Notes (Signed)
 Pt asking if she can get some information   dr pamella notified

## 2023-10-01 ENCOUNTER — Ambulatory Visit: Payer: Self-pay | Admitting: Infectious Diseases

## 2023-10-02 ENCOUNTER — Ambulatory Visit: Payer: Self-pay | Admitting: Infectious Diseases

## 2023-10-02 ENCOUNTER — Encounter: Payer: Self-pay | Admitting: Infectious Diseases

## 2023-10-11 ENCOUNTER — Encounter: Payer: Self-pay | Admitting: Gynecologic Oncology

## 2023-10-11 ENCOUNTER — Inpatient Hospital Stay: Attending: Gynecologic Oncology | Admitting: Gynecologic Oncology

## 2023-10-11 VITALS — BP 124/78 | HR 96 | Temp 98.1°F | Resp 20 | Wt 164.6 lb

## 2023-10-11 DIAGNOSIS — C519 Malignant neoplasm of vulva, unspecified: Secondary | ICD-10-CM

## 2023-10-11 DIAGNOSIS — Z923 Personal history of irradiation: Secondary | ICD-10-CM | POA: Diagnosis not present

## 2023-10-11 DIAGNOSIS — Z9221 Personal history of antineoplastic chemotherapy: Secondary | ICD-10-CM | POA: Insufficient documentation

## 2023-10-11 DIAGNOSIS — Z1231 Encounter for screening mammogram for malignant neoplasm of breast: Secondary | ICD-10-CM

## 2023-10-11 DIAGNOSIS — Z8544 Personal history of malignant neoplasm of other female genital organs: Secondary | ICD-10-CM | POA: Diagnosis present

## 2023-10-11 NOTE — Patient Instructions (Signed)
 It was good to see you today.   I will see you for follow-up in 4 months.  If you end up not getting imaging in the next couple of months with [facility] (or somewhere else), lease call to let me know.  As always, if you develop any new and concerning symptoms before your next visit, please call to see me sooner.

## 2023-10-11 NOTE — Progress Notes (Signed)
 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 Prentiss)  Initial exam by Dr. Prentiss on 09/08/19: right vulva was completely involved by a large necrotic mass extending from the inguinal crease towards the anus measuring 12x5cm. It involved the distal vagina and extended to involve the left labia as well. It was difficult to evaluate her vaginal involvement due to pain.   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(HCC)  09/10/2019 Initial Biopsy   Vulvar biopsy: at least VIN3, biopsy demonstrates extreme ulceration, inflammation and granulation tissue. Underlying invasion very difficult to rule out.   09/16/2019 Imaging   PET: IMPRESSION:  1.  Large hypermetabolic mass in the right side of the perineum, consistent with known vulvar malignancy.  2.  Hypermetabolic right groin lymphadenopathy is consistent with nodal involvement.  3.  Tiny right apical pulmonary nodule is below PET resolution.    09/16/2019 Initial Diagnosis   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(HCC)   11/03/2019 - 01/15/2020 Radiation Therapy   IMRT. She received 45GY/25 fractions to the pelvic and inguinal nodes and vulvar mass. The vulvar mass and enlarged inguinal nodes received a boost dose of 1800cGY/10 fractions for total dose of 63Gy/35 fractions.  Treated in [place]. Diagnosed with DVT several weeks into treatment. Missed treatments due to feeling poorly. Received 4 Cisplatin (stopped due to elevated Cr) - 8/5, 8/11, 8/18, 8/25   04/03/2020 Imaging   CT A/P w/o contrast: 1.  Redemonstration of the large right perineal mass, with new extensive surrounding soft tissue stranding and gas which extends along the bilateral  anterior pubic bones and anterior pelvis.  I'm unable to exclude developing necrotizing fasciitis.  No definite drainable abscess collection is identified.  2.  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  3.  Diffuse edema of the pelvic mesentery with mild presacral fluid, concerning for mesenteritis.  No definite drainable intraperitoneal fluid collection identified.  4.  Suspected bowel ileus or gastroenteritis.  5.  Moderately distended gallbladder with heterogeneous dependent material suggestive of sludge and possible gallstones.  6.  Hepatosplenomegaly.  7.  Suspected anemia.  8.  Moderate diffuse urinary bladder wall thickening suggesting chronic cystitis.    04/20/2020 - 05/05/2020 Hospital Admission   Admitted in the setting of right groin wound. Had been admitted to [facility] 1/2 with concerns for soft tissue infection. Started on vanc/zosyn --> oral levo/flagyl  to complete 3 weeks of treatment ( EOT 1/22). Pelvic MRI at [facility] showed multilocular abscess of left pelvis, myositis, nondisplaced fracture of inferior right pubic ramus, concern for osteomytlitis. IR drainage of abscess was performed - VRE on culture. ID recommended IV dapto --> tedezolid oupatient (as well as levo + flagyl ) for 6 weeks total.    04/26/2020 Imaging   MRI pelvis w/o contrast: IMPRESSION: 1. Large area of ulceration with sinus tract seen within the right labial fold with diffuse surrounding phlegmon and extensive myositis in the bilateral adductor and anterior muscular compartments of both thighs. 2. There is a multilocular fluid collection extending in the inferior left pelvic soft tissues surrounding the pubic rami and extending to the anterior upper left thigh, consistent with a multilocular abscess. 3. Nondisplaced fracture of the inferior right pubic rami with findings suggestive  osteomyelitis involving the pubic rami and anterior pubic symphysis. 4. Findings suggestive of  osteomyelitis involving the anterior left pubic symphysis.   05/05/2020 - 05/17/2020 Hospital Admission   Admitted for inpatient rehab given debility.   07/27/2020 Imaging   PET: Significant decrease in size and avidity of vulvar lesion. Air and soft tissue inflammation. Mild PET hypermetabolic activity. Resolution of inguinal LN activity. 9mm LU love groundglass nodule, mild hypermetabolic activity.   09/14/2020 Imaging   CT Chest w/o contrast: IMPRESSION:  1.  Resolution of left upper lobe nodule and associated groundglass opacity.  2.  Stable 5 mm nodular opacity right upper lobe.  3.  Stable 3 mm nodule right lower lobe.    07/20/2021 Imaging   PET: No findings suspicious for recurrent vulvar carcinoma or metastatic disease. 2. Persistent open wound along the right perineum extending down to the bone. There are progressive changes of probable osteomyelitis involving the right inferior pubic ramus with pathologic fractures. 3. Interval improved appearance of the pubic symphysis and adjacent pubic bones.   03/09/2022 Imaging   PET: 1. No signs of distant metastatic disease. 2. Progressive changes of osteomyelitis and signs of pubic bone fracture on the RIGHT. 3. Increasing metabolic activity of the RIGHT groin could relate to worsening inflammation in this area, focal nature does not allow for exclusion of metastatic process. Would suggest focused ultrasound with sampling as warranted. 4. No gross focal uptake to suggest recurrence in the site of resection though there is peripheral uptake that is indeterminate. 5. Gross edema throughout the lower extremity on the RIGHT with expansion of the upper RIGHT thigh. Correlate with any signs of impaired venous drainage. Given the marked asymmetry now nearly 6 weeks following arthroplasty would suggest RIGHT lower extremity venous Doppler to exclude DVT. 6. Anal canal uptake potentially physiologic. Given patient history and proximity  to tumor considered correlation with physical exam. 7. Post RIGHT lower quadrant renal transplant. Amount of uptake and or excretion of FDG by the RIGHT kidney is perhaps less than expected though is not well assessed. Correlate with renal function. 8. Aortic atherosclerosis and coronary artery disease. 9. Post RIGHT hip arthroplasty with increased metabolic activity potentially related to recent surgery. It would be difficult to exclude the possibility of secondary infection of this area in the setting of chronic open wound and worsening sequela of osteomyelitis.   03/20/2022 Imaging   CT A/P: IMPRESSION: 1. Asymmetric soft tissue thickening involving the upper RIGHT perineum, incompletely imaged, with chronic open soft tissue wound underlying the RIGHT inferior pubic ramus. Associated destructive/lytic changes of the RIGHT inferior pubic ramus (discontinuity of the mid/posterior portion of the RIGHT inferior pubic ramus), corresponding to patient's known pathologic process in this area. However, there is increased destruction/discontinuity of the RIGHT inferior pubic ramus compared to the earlier PET-CT of 07/20/2021 indicating some degree of progression. 2. Chronic pathologic-appearing fracture deformity at the lateral aspect of the RIGHT superior pubic ramus/anterior acetabulum. The adjacent RIGHT hip prosthesis appears intact and stable in position. 3. 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 Additional hydronephrosis of the somewhat atrophic-appearing transplant kidney in the LEFT pelvis. Bladder is distended, suggesting associated bladder outlet obstruction.   03/21/2022 Imaging   Abdominal pelvic ultrasound: Sonographic evaluation of the area of concern in the right labia demonstrates soft tissue thickening similar to prior CT scans dating back to 04/03/2020. This may be due to chronic inflammation or postsurgical  change. No discrete fluid  collection identified to indicate abscess.   07/23/2022 Imaging   MRI pelvis: 1. Chronic osteomyelitis involving the right inferior pubic ramus which demonstrates destructive bony changes. 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 2. Persistent air containing fistulous track possibly involving the anterior wall the rectum or anus and extending into the region of the inferior pubic ramus. No discrete fluid collection/abscess is identified. 3. Small amount of free pelvic fluid of uncertain significance. 4. Diffuse cellulitis and myofasciitis involving hip and pelvic musculature most notably the adductor muscles bilaterally. No definite findings for pyomyositis.   11/10/2022 Imaging   MRI pelvis: 1. Changes of chronic osteomyelitis involving the pubic bones with pathologic fractures involving the right superior and inferior pubic rami. Stable widened pubic symphysis related to chronic septic arthritis but overall this appears slightly improved. 2. Persistent air containing fistulous tract in the right inferior pubic region which could possibly communicating with the lower vagina but does not appear to involve the rectum. 3. Persistent changes of myofasciitis involving the pelvic and hip musculature but no findings for pyomyositis or drainable soft tissue abscess. 4. 3.8 cm fluid collection near the greater trochanter of the left hip could reflect bursitis or postoperative fluid collection. 5. Moderate symmetric bladder wall thickening, unchanged.     Interval History: Overall doing okay.  Has been told by [facility] that she is not a candidate for kidney transplant.  Has noticed periods of increased discharge and odor from her groin wounds.  Was using silver  nitrate but has stopped.  Reports baseline bowel and bladder function.  Past Medical/Surgical History: Past Medical History:   Diagnosis Date   Complication of anesthesia    nausea and vomiting   Dialysis patient (HCC)    Currenlty back on dialysis   DVT (deep venous thrombosis) (HCC)    History of osteomyelitis    Kidney transplant failure    Kidney transplant recipient    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[facility])     Past Surgical History:  Procedure Laterality Date   APPLICATION OF WOUND VAC Right 01/22/2022   Procedure: APPLICATION OF WOUND VAC;  Surgeon: Jerri Kay HERO, MD;  Location: [facility] OR;  Service: Orthopedics;  Laterality: Right;   BIOPSY  04/24/2020   Procedure: BIOPSY;  Surgeon: Legrand Victory LITTIE DOUGLAS, MD;  Location: [facility] ENDOSCOPY;  Service: Gastroenterology;;   BIOPSY  05/27/2022   Procedure: BIOPSY;  Surgeon: Eda Iha, MD;  Location: [facility] ENDOSCOPY;  Service: Gastroenterology;;   CATARACT EXTRACTION  04/2023   Both   COLONOSCOPY WITH PROPOFOL  N/A 05/27/2022   Procedure: COLONOSCOPY WITH PROPOFOL ;  Surgeon: Eda Iha, MD;  Location: [facility] ENDOSCOPY;  Service: Gastroenterology;  Laterality: N/A;   ESOPHAGOGASTRODUODENOSCOPY (EGD) WITH PROPOFOL  N/A 04/24/2020   Procedure: ESOPHAGOGASTRODUODENOSCOPY (EGD) WITH PROPOFOL ;  Surgeon: Legrand Victory LITTIE DOUGLAS, MD;  Location: [facility] ENDOSCOPY;  Service: Gastroenterology;  Laterality: N/A;   ESOPHAGOGASTRODUODENOSCOPY (EGD) WITH PROPOFOL  N/A 05/27/2022   Procedure: ESOPHAGOGASTRODUODENOSCOPY (EGD) WITH PROPOFOL ;  Surgeon: Eda Iha, MD;  Location: [facility] ENDOSCOPY;  Service: Gastroenterology;  Laterality: N/A;   GIVENS CAPSULE STUDY N/A 05/28/2022   Procedure: GIVENS CAPSULE STUDY;  Surgeon: Eda Iha, MD;  Location: [facility] ENDOSCOPY;  Service: Gastroenterology;  Laterality: N/A;   IR FLUORO GUIDE CV LINE RIGHT  03/24/2022   IR FLUORO GUIDE CV LINE RIGHT  07/24/2022   IR FLUORO GUIDE CV LINE RIGHT  11/22/2022   IR PERC TUN PERIT CATH WO PORT S&I SHERRILL  03/24/2022  IR PTA VENOUS EXCEPT DIALYSIS CIRCUIT  11/22/2022   IR REMOVAL TUN CV CATH W/O FL  05/08/2022   IR US   GUIDE BX ASP/DRAIN  04/30/2020   IR US  GUIDE VASC ACCESS RIGHT  03/24/2022   IR US  GUIDE VASC ACCESS RIGHT  07/24/2022   KIDNEY TRANSPLANT  2001   KIDNEY TRANSPLANT  2014   LEEP     ORIF TIBIA PLATEAU Right 01/22/2022   Procedure: OPEN REDUCTION INTERNAL FIXATION (ORIF) TIBIAL PLATEAU;  Surgeon: Addie Cordella Hamilton, MD;  Location: [facility] OR;  Service: Orthopedics;  Laterality: Right;   TOTAL ABDOMINAL HYSTERECTOMY     ovaries left in situ   TOTAL HIP ARTHROPLASTY Right 01/22/2022   Procedure: TOTAL HIP ARTHROPLASTY ANTERIOR APPROACH;  Surgeon: Jerri Kay HERO, MD;  Location: [facility] OR;  Service: Orthopedics;  Laterality: Right;   TOTAL HIP ARTHROPLASTY Left 09/28/2022   Procedure: TOTAL HIP POSTERIOR;  Surgeon: Josefina Chew, MD;  Location: [facility] OR;  Service: Orthopedics;  Laterality: Left;    Family History  Problem Relation Age of Onset   Cerebral aneurysm Mother    AAA (abdominal aortic aneurysm) Brother    Breast cancer Paternal Aunt    Heart attack Maternal Grandmother    Brain cancer Maternal Grandfather    Colon cancer Neg Hx    Ovarian cancer Neg Hx    Endometrial cancer Neg Hx    Pancreatic cancer Neg Hx    Prostate cancer Neg Hx     Social History   Socioeconomic History   Marital status: Married    Spouse name: Not on file   Number of children: Not on file   Years of education: Not on file   Highest education level: Not on file  Occupational History   Not on file  Tobacco Use   Smoking status: Never   Smokeless tobacco: Never  Vaping Use   Vaping status: Never Used  Substance and Sexual Activity   Alcohol use: Not Currently   Drug use: Never   Sexual activity: Not Currently  Other Topics Concern   Not on file  Social History Narrative   Not on file   Social Drivers of Health   Financial Resource Strain: Low Risk  (05/01/2022)   Received from [facility]   Overall Financial Resource Strain (CARDIA)    Difficulty of Paying Living Expenses: Not very hard   Food Insecurity: No Food Insecurity (09/27/2022)   Hunger Vital Sign    Worried About Running Out of Food in the Last Year: Never true    Ran Out of Food in the Last Year: Never true  Transportation Needs: No Transportation Needs (09/27/2022)   PRAPARE - Administrator, Civil Service (Medical): No    Lack of Transportation (Non-Medical): No  Physical Activity: Not on file  Stress: No Stress Concern Present (04/04/2020)   Received from [facility] - Occupational Stress Questionnaire    Feeling of Stress : Not at all  Social Connections: Unknown (08/16/2021)   Received from [facility]   Social Network    Social Network: Not on file    Current Medications:  Current Outpatient Medications:    acetaminophen  (TYLENOL ) 500 MG tablet, Take 1,000 mg by mouth every 6 (six) hours as needed for moderate pain (pain score 4-6), fever or headache., Disp: , Rfl:    albuterol  (VENTOLIN  HFA) 108 (90 Base) MCG/ACT inhaler, Inhale 2 puffs into the lungs every 6 (six)  hours as needed for wheezing or shortness of breath., Disp: , Rfl:    calcitRIOL  (ROCALTROL ) 0.5 MCG capsule, Take 0.5 mcg by mouth every Monday, Wednesday, and Friday at 8 PM., Disp: , Rfl:    cefadroxil  (DURICEF) 500 MG capsule, Take 1 capsule (500 mg total) by mouth every evening. After dialysis. Take 7 - 14 days depending on how the cellulitis responds. (Patient not taking: Reported on 09/30/2023), Disp: 30 capsule, Rfl: 5   fosfomycin (MONUROL ) 3 g PACK, Take 3 g by mouth once. (Patient not taking: Reported on 09/30/2023), Disp: , Rfl:    gabapentin  (NEURONTIN ) 100 MG capsule, Take 1 capsule (100 mg total) by mouth 3 (three) times daily. (Patient taking differently: Take 100 mg by mouth every Monday, Wednesday, and Friday at 8 PM.), Disp: 21 capsule, Rfl: 0   multivitamin (RENA-VIT) TABS tablet, Take 1 tablet by mouth at bedtime. (Patient not taking: Reported on 09/30/2023), Disp: 30  tablet, Rfl: 1   pantoprazole  (PROTONIX ) 40 MG tablet, TAKE 1 TABLET (40 MG TOTAL) BY MOUTH 2 (TWO) TIMES DAILY BEFORE A MEAL., Disp: 60 tablet, Rfl: 0   predniSONE  (DELTASONE ) 5 MG tablet, Take 5 mg by mouth daily with breakfast., Disp: , Rfl:    sevelamer carbonate (RENVELA) 800 MG tablet, Take 800 mg by mouth 3 (three) times daily with meals., Disp: , Rfl:    tacrolimus  (PROGRAF ) 1 MG capsule, Take 1 mg by mouth 2 (two) times daily., Disp: , Rfl:   Review of Systems: + swelling Denies appetite changes, fevers, chills, fatigue, unexplained weight changes. Denies hearing loss, neck lumps or masses, mouth sores, ringing in ears or voice changes. Denies cough or wheezing.  Denies shortness of breath. Denies chest pain or palpitations. Denies leg swelling. Denies abdominal distention, pain, blood in stools, constipation, diarrhea, nausea, vomiting, or early satiety. Denies pain with intercourse, dysuria, frequency, hematuria or incontinence. Denies hot flashes, pelvic pain, vaginal bleeding or vaginal discharge.   Denies joint pain, back pain or muscle pain/cramps. Denies itching, rash, or wounds. Denies dizziness, headaches, numbness or seizures. Denies swollen lymph nodes or glands, denies easy bruising or bleeding. Denies anxiety, depression, confusion, or decreased concentration.  Physical Exam: BP (!) 147/79 (BP Location: Right Arm, Patient Position: Sitting) Comment: Notified RN  Pulse 96   Temp 98.1 F (36.7 C) (Oral)   Resp 20   Wt 164 lb 9.6 oz (74.7 kg)   SpO2 100%   BMI 27.39 kg/m  General: Alert, oriented, no acute distress. HEENT: Normocephalic, atraumatic, sclera anicteric. Chest: Clear to auscultation bilaterally.  No wheezes or rhonchi. Cardiovascular: Regular rate and rhythm, no murmurs. Abdomen: soft, nontender.  Normoactive bowel sounds.  No masses or hepatosplenomegaly appreciated.   Extremities: Grossly normal range of motion.  Warm, well perfused.  2+ edema of  right leg with erythema of the right lower extremity.  Bandage covering the midportion of the right lower leg. Skin: No rashes or lesions noted. Lymphatics: No cervical, supraclavicular adenopathy.  No left inguinal adenopathy GU:  2+ edema of mons. External female genitalia with similar appearance from last visit although the size of her open wound has continued to decrease some and there seems to be some hypertrophy of the tissue within the open wound itself.  Unable to palpate bone at the base of the open wound today.  She continues to have an open wound along the right groin with complete lack of labia majora and minora on that side.  There is exudate over  the base of the wound.  Along the inferior aspect both laterally and medially, there are several condylomatous lesions that are new since her last exam.  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  There is continued edema of the left labia, similar.  Urethra is normal in appearance.  There is a small amount of fibrinous exudate over bone within her open wound, no purulence or bleeding.   Radiation changes of the vulva noted with significant edema.  Laboratory & Radiologic Studies: None new  Assessment & Plan: CYNDE MENARD is a 58 y.o. woman with a history of stage IIIC vulvar cancer presenting for surveillance visit today. Completed treatment 01/2020. Completed hyperbaric oxygen therapy for chronic right inguinal wound. 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 Right groin biopsy performed in 05/2023 showed benign skin with marked hyperparakeratosis and focal bacterial colonization, no dysplasia or malignancy.   Patient is overall doing well.  Some hypertrophy of the wound bed compared to most recent visit.  This may be related to silver  nitrate use.  Discussed things that may help with odor that she experiences intermittently related to open wound.    She is interested in getting a second opinion regarding candidacy for transplant.  Think she would likely go to [facility].  We will hold off on imaging (either PET or MRI) as it may be beneficial for this imaging to happen wherever she chooses to get a second opinion.  Otherwise, plan to get imaging in the next couple of months.   We discussed plan for follow-up visit in 4 months, exam at that time.  25 minutes of total time was spent for this patient encounter, including preparation, face-to-face counseling with the patient and coordination of care, and documentation of the encounter.  Comer Dollar, MD  Division of Gynecologic Oncology  Department of Obstetrics and Gynecology  [facility]

## 2023-10-15 MED ORDER — CEFADROXIL 500 MG PO CAPS: 500.0000 mg | ORAL_CAPSULE | Freq: Every evening | ORAL | 0 refills | Status: DC

## 2023-10-15 NOTE — Addendum Note (Signed)
 Addended by: MELVENIA COREAN SAILOR on: 10/15/2023 09:24 AM   Modules accepted: Orders

## 2023-12-12 ENCOUNTER — Encounter (INDEPENDENT_AMBULATORY_CARE_PROVIDER_SITE_OTHER): Payer: Self-pay

## 2023-12-12 DIAGNOSIS — N390 Urinary tract infection, site not specified: Secondary | ICD-10-CM | POA: Diagnosis not present

## 2023-12-12 MED ORDER — CEFADROXIL 500 MG PO CAPS: 500.0000 mg | ORAL_CAPSULE | Freq: Every evening | ORAL | 0 refills | Status: DC

## 2023-12-12 NOTE — Telephone Encounter (Signed)
Please see the MyChart message reply(ies) for my assessment and plan.    This patient gave consent for this Medical Advice Message and is aware that it may result in a bill to their insurance company, as well as the possibility of receiving a bill for a co-payment or deductible. They are an established patient, but are not seeking medical advice exclusively about a problem treated during an in person or video visit in the last seven days. I did not recommend an in person or video visit within seven days of my reply.    I spent a total of 5 minutes cumulative time within 7 days through MyChart messaging.  Eulla Kochanowski, NP   

## 2024-01-28 ENCOUNTER — Encounter: Payer: Self-pay | Admitting: Nurse Practitioner

## 2024-02-08 ENCOUNTER — Encounter: Payer: Self-pay | Admitting: Gynecologic Oncology

## 2024-02-08 ENCOUNTER — Inpatient Hospital Stay: Attending: Gynecologic Oncology | Admitting: Gynecologic Oncology

## 2024-02-08 VITALS — BP 137/77 | HR 98 | Temp 98.9°F | Resp 19 | Wt 157.2 lb

## 2024-02-08 DIAGNOSIS — Z08 Encounter for follow-up examination after completed treatment for malignant neoplasm: Secondary | ICD-10-CM | POA: Insufficient documentation

## 2024-02-08 DIAGNOSIS — B977 Papillomavirus as the cause of diseases classified elsewhere: Secondary | ICD-10-CM | POA: Diagnosis not present

## 2024-02-08 DIAGNOSIS — Z8544 Personal history of malignant neoplasm of other female genital organs: Secondary | ICD-10-CM | POA: Diagnosis not present

## 2024-02-08 DIAGNOSIS — Z923 Personal history of irradiation: Secondary | ICD-10-CM | POA: Diagnosis not present

## 2024-02-08 DIAGNOSIS — M8660 Other chronic osteomyelitis, unspecified site: Secondary | ICD-10-CM | POA: Insufficient documentation

## 2024-02-08 DIAGNOSIS — Z9079 Acquired absence of other genital organ(s): Secondary | ICD-10-CM | POA: Insufficient documentation

## 2024-02-08 DIAGNOSIS — B952 Enterococcus as the cause of diseases classified elsewhere: Secondary | ICD-10-CM | POA: Diagnosis not present

## 2024-02-08 DIAGNOSIS — Z9221 Personal history of antineoplastic chemotherapy: Secondary | ICD-10-CM | POA: Diagnosis not present

## 2024-02-08 DIAGNOSIS — B3731 Acute candidiasis of vulva and vagina: Secondary | ICD-10-CM | POA: Diagnosis not present

## 2024-02-08 DIAGNOSIS — B379 Candidiasis, unspecified: Secondary | ICD-10-CM

## 2024-02-08 DIAGNOSIS — C519 Malignant neoplasm of vulva, unspecified: Secondary | ICD-10-CM

## 2024-02-08 MED ORDER — NYSTATIN 100000 UNIT/GM EX POWD: 1.0000 | Freq: Three times a day (TID) | CUTANEOUS | 1 refills | Status: DC

## 2024-02-08 NOTE — Progress Notes (Signed)
 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 Prentiss)  Initial exam by Dr. Prentiss on 09/08/19: right vulva was completely involved by a large necrotic mass extending from the inguinal crease towards the anus measuring 12x5cm. It involved the distal vagina and extended to involve the left labia as well. It was difficult to evaluate her vaginal involvement due to pain.   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(HCC)  09/10/2019 Initial Biopsy   Vulvar biopsy: at least VIN3, biopsy demonstrates extreme ulceration, inflammation and granulation tissue. Underlying invasion very difficult to rule out.   09/16/2019 Imaging   PET: IMPRESSION:  1.  Large hypermetabolic mass in the right side of the perineum, consistent with known vulvar malignancy.  2.  Hypermetabolic right groin lymphadenopathy is consistent with nodal involvement.  3.  Tiny right apical pulmonary nodule is below PET resolution.    09/16/2019 Initial Diagnosis   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(HCC)   11/03/2019 - 01/15/2020 Radiation Therapy   IMRT. She received 45GY/25 fractions to the pelvic and inguinal nodes and vulvar mass. The vulvar mass and enlarged inguinal nodes received a boost dose of 1800cGY/10 fractions for total dose of 63Gy/35 fractions.  Treated in [place]. Diagnosed with DVT several weeks into treatment. Missed treatments due to feeling poorly. Received 4 Cisplatin (stopped due to elevated Cr) - 8/5, 8/11, 8/18, 8/25   04/03/2020 Imaging   CT A/P w/o contrast: 1.  Redemonstration of the large right perineal mass, with new extensive surrounding soft tissue stranding and gas which extends along the bilateral  anterior pubic bones and anterior pelvis.  I'm unable to exclude developing necrotizing fasciitis.  No definite drainable abscess collection is identified.  2.  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  3.  Diffuse edema of the pelvic mesentery with mild presacral fluid, concerning for mesenteritis.  No definite drainable intraperitoneal fluid collection identified.  4.  Suspected bowel ileus or gastroenteritis.  5.  Moderately distended gallbladder with heterogeneous dependent material suggestive of sludge and possible gallstones.  6.  Hepatosplenomegaly.  7.  Suspected anemia.  8.  Moderate diffuse urinary bladder wall thickening suggesting chronic cystitis.    04/20/2020 - 05/05/2020 Hospital Admission   Admitted in the setting of right groin wound. Had been admitted to [facility] 1/2 with concerns for soft tissue infection. Started on vanc/zosyn --> oral levo/flagyl  to complete 3 weeks of treatment ( EOT 1/22). Pelvic MRI at [facility] showed multilocular abscess of left pelvis, myositis, nondisplaced fracture of inferior right pubic ramus, concern for osteomytlitis. IR drainage of abscess was performed - VRE on culture. ID recommended IV dapto --> tedezolid oupatient (as well as levo + flagyl ) for 6 weeks total.    04/26/2020 Imaging   MRI pelvis w/o contrast: IMPRESSION: 1. Large area of ulceration with sinus tract seen within the right labial fold with diffuse surrounding phlegmon and extensive myositis in the bilateral adductor and anterior muscular compartments of both thighs. 2. There is a multilocular fluid collection extending in the inferior left pelvic soft tissues surrounding the pubic rami and extending to the anterior upper left thigh, consistent with a multilocular abscess. 3. Nondisplaced fracture of the inferior right pubic rami with findings suggestive osteomyelitis  involving the pubic rami and anterior pubic symphysis. 4. Findings suggestive of  osteomyelitis involving the anterior left pubic symphysis.   05/05/2020 - 05/17/2020 Hospital Admission   Admitted for inpatient rehab given debility.   07/27/2020 Imaging   PET: Significant decrease in size and avidity of vulvar lesion. Air and soft tissue inflammation. Mild PET hypermetabolic activity. Resolution of inguinal LN activity. 9mm LU love groundglass nodule, mild hypermetabolic activity.   09/14/2020 Imaging   CT Chest w/o contrast: IMPRESSION:  1.  Resolution of left upper lobe nodule and associated groundglass opacity.  2.  Stable 5 mm nodular opacity right upper lobe.  3.  Stable 3 mm nodule right lower lobe.    07/20/2021 Imaging   PET: No findings suspicious for recurrent vulvar carcinoma or metastatic disease. 2. Persistent open wound along the right perineum extending down to the bone. There are progressive changes of probable osteomyelitis involving the right inferior pubic ramus with pathologic fractures. 3. Interval improved appearance of the pubic symphysis and adjacent pubic bones.   03/09/2022 Imaging   PET: 1. No signs of distant metastatic disease. 2. Progressive changes of osteomyelitis and signs of pubic bone fracture on the RIGHT. 3. Increasing metabolic activity of the RIGHT groin could relate to worsening inflammation in this area, focal nature does not allow for exclusion of metastatic process. Would suggest focused ultrasound with sampling as warranted. 4. No gross focal uptake to suggest recurrence in the site of resection though there is peripheral uptake that is indeterminate. 5. Gross edema throughout the lower extremity on the RIGHT with expansion of the upper RIGHT thigh. Correlate with any signs of impaired venous drainage. Given the marked asymmetry now nearly 6 weeks following arthroplasty would suggest RIGHT lower extremity venous Doppler to exclude DVT. 6. Anal canal uptake potentially physiologic. Given patient history and proximity  to tumor considered correlation with physical exam. 7. Post RIGHT lower quadrant renal transplant. Amount of uptake and or excretion of FDG by the RIGHT kidney is perhaps less than expected though is not well assessed. Correlate with renal function. 8. Aortic atherosclerosis and coronary artery disease. 9. Post RIGHT hip arthroplasty with increased metabolic activity potentially related to recent surgery. It would be difficult to exclude the possibility of secondary infection of this area in the setting of chronic open wound and worsening sequela of osteomyelitis.   03/20/2022 Imaging   CT A/P: IMPRESSION: 1. Asymmetric soft tissue thickening involving the upper RIGHT perineum, incompletely imaged, with chronic open soft tissue wound underlying the RIGHT inferior pubic ramus. Associated destructive/lytic changes of the RIGHT inferior pubic ramus (discontinuity of the mid/posterior portion of the RIGHT inferior pubic ramus), corresponding to patient's known pathologic process in this area. However, there is increased destruction/discontinuity of the RIGHT inferior pubic ramus compared to the earlier PET-CT of 07/20/2021 indicating some degree of progression. 2. Chronic pathologic-appearing fracture deformity at the lateral aspect of the RIGHT superior pubic ramus/anterior acetabulum. The adjacent RIGHT hip prosthesis appears intact and stable in position. 3. 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 Additional hydronephrosis of the somewhat atrophic-appearing transplant kidney in the LEFT pelvis. Bladder is distended, suggesting associated bladder outlet obstruction.   03/21/2022 Imaging   Abdominal pelvic ultrasound: Sonographic evaluation of the area of concern in the right labia demonstrates soft tissue thickening similar to prior CT scans dating back to 04/03/2020. This may be due to chronic inflammation or postsurgical  change. No discrete fluid collection  identified to indicate abscess.   07/23/2022 Imaging   MRI pelvis: 1. Chronic osteomyelitis involving the right inferior pubic ramus which demonstrates destructive bony changes. 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 2. Persistent air containing fistulous track possibly involving the anterior wall the rectum or anus and extending into the region of the inferior pubic ramus. No discrete fluid collection/abscess is identified. 3. Small amount of free pelvic fluid of uncertain significance. 4. Diffuse cellulitis and myofasciitis involving hip and pelvic musculature most notably the adductor muscles bilaterally. No definite findings for pyomyositis.   11/10/2022 Imaging   MRI pelvis: 1. Changes of chronic osteomyelitis involving the pubic bones with pathologic fractures involving the right superior and inferior pubic rami. Stable widened pubic symphysis related to chronic septic arthritis but overall this appears slightly improved. 2. Persistent air containing fistulous tract in the right inferior pubic region which could possibly communicating with the lower vagina but does not appear to involve the rectum. 3. Persistent changes of myofasciitis involving the pelvic and hip musculature but no findings for pyomyositis or drainable soft tissue abscess. 4. 3.8 cm fluid collection near the greater trochanter of the left hip could reflect bursitis or postoperative fluid collection. 5. Moderate symmetric bladder wall thickening, unchanged.     Interval History: Overall doing okay.  Continues to have vaginal discharge that comes and goes.  Denies any bleeding.  Has been struggling with constipation.  Planning to go to [facility] in [place]  in mid December for evaluation.  Past Medical/Surgical History: Past Medical History:  Diagnosis Date   Complication of  anesthesia    nausea and vomiting   Dialysis patient    Currenlty back on dialysis   DVT (deep venous thrombosis) (HCC)    History of osteomyelitis    Kidney transplant failure    Kidney transplant recipient    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[facility])     Past Surgical History:  Procedure Laterality Date   APPLICATION OF WOUND VAC Right 01/22/2022   Procedure: APPLICATION OF WOUND VAC;  Surgeon: Jerri Kay HERO, MD;  Location: [facility] OR;  Service: Orthopedics;  Laterality: Right;   BIOPSY  04/24/2020   Procedure: BIOPSY;  Surgeon: Legrand Victory LITTIE DOUGLAS, MD;  Location: [facility] ENDOSCOPY;  Service: Gastroenterology;;   BIOPSY  05/27/2022   Procedure: BIOPSY;  Surgeon: Eda Iha, MD;  Location: [facility] ENDOSCOPY;  Service: Gastroenterology;;   CATARACT EXTRACTION  04/2023   Both   COLONOSCOPY WITH PROPOFOL  N/A 05/27/2022   Procedure: COLONOSCOPY WITH PROPOFOL ;  Surgeon: Eda Iha, MD;  Location: [facility] ENDOSCOPY;  Service: Gastroenterology;  Laterality: N/A;   ESOPHAGOGASTRODUODENOSCOPY (EGD) WITH PROPOFOL  N/A 04/24/2020   Procedure: ESOPHAGOGASTRODUODENOSCOPY (EGD) WITH PROPOFOL ;  Surgeon: Legrand Victory LITTIE DOUGLAS, MD;  Location: [facility] ENDOSCOPY;  Service: Gastroenterology;  Laterality: N/A;   ESOPHAGOGASTRODUODENOSCOPY (EGD) WITH PROPOFOL  N/A 05/27/2022   Procedure: ESOPHAGOGASTRODUODENOSCOPY (EGD) WITH PROPOFOL ;  Surgeon: Eda Iha, MD;  Location: [facility] ENDOSCOPY;  Service: Gastroenterology;  Laterality: N/A;   GIVENS CAPSULE STUDY N/A 05/28/2022   Procedure: GIVENS CAPSULE STUDY;  Surgeon: Eda Iha, MD;  Location: [facility] ENDOSCOPY;  Service: Gastroenterology;  Laterality: N/A;   IR FLUORO GUIDE CV LINE RIGHT  03/24/2022   IR FLUORO GUIDE CV LINE RIGHT  07/24/2022   IR FLUORO GUIDE CV LINE RIGHT  11/22/2022   IR PERC TUN PERIT CATH WO PORT S&I /IMAG  03/24/2022   IR PTA VENOUS EXCEPT DIALYSIS CIRCUIT  11/22/2022  IR REMOVAL TUN CV CATH W/O FL  05/08/2022   IR US  GUIDE BX ASP/DRAIN  04/30/2020   IR US  GUIDE  VASC ACCESS RIGHT  03/24/2022   IR US  GUIDE VASC ACCESS RIGHT  07/24/2022   KIDNEY TRANSPLANT  2001   KIDNEY TRANSPLANT  2014   LEEP     ORIF TIBIA PLATEAU Right 01/22/2022   Procedure: OPEN REDUCTION INTERNAL FIXATION (ORIF) TIBIAL PLATEAU;  Surgeon: Addie Cordella Hamilton, MD;  Location: [facility] OR;  Service: Orthopedics;  Laterality: Right;   TOTAL ABDOMINAL HYSTERECTOMY     ovaries left in situ   TOTAL HIP ARTHROPLASTY Right 01/22/2022   Procedure: TOTAL HIP ARTHROPLASTY ANTERIOR APPROACH;  Surgeon: Jerri Kay HERO, MD;  Location: [facility] OR;  Service: Orthopedics;  Laterality: Right;   TOTAL HIP ARTHROPLASTY Left 09/28/2022   Procedure: TOTAL HIP POSTERIOR;  Surgeon: Josefina Chew, MD;  Location: [facility] OR;  Service: Orthopedics;  Laterality: Left;    Family History  Problem Relation Age of Onset   Cerebral aneurysm Mother    AAA (abdominal aortic aneurysm) Brother    Breast cancer Paternal Aunt    Heart attack Maternal Grandmother    Brain cancer Maternal Grandfather    Colon cancer Neg Hx    Ovarian cancer Neg Hx    Endometrial cancer Neg Hx    Pancreatic cancer Neg Hx    Prostate cancer Neg Hx     Social History   Socioeconomic History   Marital status: Married    Spouse name: Not on file   Number of children: Not on file   Years of education: Not on file   Highest education level: Not on file  Occupational History   Not on file  Tobacco Use   Smoking status: Never   Smokeless tobacco: Never  Vaping Use   Vaping status: Never Used  Substance and Sexual Activity   Alcohol use: Not Currently   Drug use: Never   Sexual activity: Not Currently  Other Topics Concern   Not on file  Social History Narrative   Not on file   Social Drivers of Health   Financial Resource Strain: Low Risk  (05/01/2022)   Received from [facility]   Overall Financial Resource Strain (CARDIA)    Difficulty of Paying Living Expenses: Not very hard  Food Insecurity: No Food Insecurity (09/27/2022)    Hunger Vital Sign    Worried About Running Out of Food in the Last Year: Never true    Ran Out of Food in the Last Year: Never true  Transportation Needs: No Transportation Needs (09/27/2022)   PRAPARE - Administrator, Civil Service (Medical): No    Lack of Transportation (Non-Medical): No  Physical Activity: Not on file  Stress: No Stress Concern Present (04/04/2020)   Received from [facility] - Occupational Stress Questionnaire    Feeling of Stress : Not at all  Social Connections: Unknown (08/16/2021)   Received from [facility]   Social Network    Social Network: Not on file    Current Medications:  Current Outpatient Medications:    acetaminophen  (TYLENOL ) 500 MG tablet, Take 1,000 mg by mouth every 6 (six) hours as needed for moderate pain (pain score 4-6), fever or headache., Disp: , Rfl:    albuterol  (VENTOLIN  HFA) 108 (90 Base) MCG/ACT inhaler, Inhale 2 puffs into the lungs every 6 (six) hours as needed for wheezing or shortness of breath., Disp: ,  Rfl:    calcitRIOL  (ROCALTROL ) 0.5 MCG capsule, Take 0.5 mcg by mouth every Monday, Wednesday, and Friday at 8 PM., Disp: , Rfl:    gabapentin  (NEURONTIN ) 100 MG capsule, Take 1 capsule (100 mg total) by mouth 3 (three) times daily., Disp: 21 capsule, Rfl: 0   nystatin (MYCOSTATIN/NYSTOP) powder, Apply 1 Application topically 3 (three) times daily., Disp: 15 g, Rfl: 1   cefadroxil  (DURICEF) 500 MG capsule, Take 1 capsule (500 mg total) by mouth every evening. After dialysis. Take 7 - 14 days depending on how the cellulitis responds. (Patient not taking: Reported on 02/05/2024), Disp: 10 capsule, Rfl: 0   cefadroxil  (DURICEF) 500 MG capsule, Take 1 capsule (500 mg total) by mouth every evening. (Patient not taking: Reported on 02/05/2024), Disp: 7 capsule, Rfl: 0   multivitamin (RENA-VIT) TABS tablet, Take 1 tablet by mouth at bedtime. (Patient not taking: Reported on 09/30/2023),  Disp: 30 tablet, Rfl: 1   pantoprazole  (PROTONIX ) 40 MG tablet, TAKE 1 TABLET (40 MG TOTAL) BY MOUTH 2 (TWO) TIMES DAILY BEFORE A MEAL., Disp: 60 tablet, Rfl: 0   predniSONE  (DELTASONE ) 5 MG tablet, Take 5 mg by mouth daily with breakfast., Disp: , Rfl:    sevelamer carbonate (RENVELA) 800 MG tablet, Take 800 mg by mouth 3 (three) times daily with meals., Disp: , Rfl:    tacrolimus  (PROGRAF ) 1 MG capsule, Take 1 mg by mouth 2 (two) times daily., Disp: , Rfl:   Review of Systems: + vaginal discharge Denies appetite changes, fevers, chills, fatigue, unexplained weight changes. Denies hearing loss, neck lumps or masses, mouth sores, ringing in ears or voice changes. Denies cough or wheezing.  Denies shortness of breath. Denies chest pain or palpitations. Denies leg swelling. Denies abdominal distention, pain, blood in stools, constipation, diarrhea, nausea, vomiting, or early satiety. Denies pain with intercourse, dysuria, frequency, hematuria or incontinence. Denies hot flashes, pelvic pain, vaginal bleeding.   Denies joint pain, back pain or muscle pain/cramps. Denies itching, rash, or wounds. Denies dizziness, headaches, numbness or seizures. Denies swollen lymph nodes or glands, denies easy bruising or bleeding. Denies anxiety, depression, confusion, or decreased concentration.  Physical Exam: BP 137/77 (BP Location: Left Arm, Patient Position: Sitting)   Pulse 98   Temp 98.9 F (37.2 C) (Oral)   Resp 19   Wt 157 lb 3.2 oz (71.3 kg)   SpO2 98%   BMI 26.16 kg/m  General: Alert, oriented, no acute distress. HEENT: Normocephalic, atraumatic, sclera anicteric. Chest: Clear to auscultation bilaterally.  No wheezes or rhonchi. Cardiovascular: Regular rate and rhythm, no murmurs. Abdomen: soft, nontender.  Normoactive bowel sounds.  No masses or hepatosplenomegaly appreciated.   Extremities: Grossly normal range of motion.  Warm, well perfused.  2+ edema of right leg with erythema of  the right lower extremity.  Bandage covering the midportion of the right lower leg. Skin: No rashes or lesions noted. Lymphatics: No cervical, supraclavicular adenopathy.  No left inguinal adenopathy GU:  2+ edema of mons. External female genitalia with similar appearance from last visit although the size of her open wound has continued to decrease some and there seems to be some hypertrophy of the tissue within the open wound itself.  Unable to palpate bone at the base of the open wound today but there is a very thin tan area that looks like a piece of bone that is more superficial within the wound itself today this is firm on palpation.  Minimal exudate over the base of the  wound itself..  Along the inferior aspect both laterally and medially, there are several condylomatous lesions that similar from her last exam.  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  There is continued edema of the left labia, similar.  Urethra is normal in appearance.  Radiation changes of the vulva noted with significant edema.  Laboratory & Radiologic Studies: None new  Assessment & Plan: Christina Mack is a 58 y.o. woman with a history of stage IIIC vulvar cancer presenting for surveillance visit today. Completed treatment 01/2020. Completed hyperbaric oxygen therapy for chronic right inguinal wound. 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 Right groin biopsy performed in 05/2023 showed benign skin with marked hyperparakeratosis and focal bacterial colonization, no dysplasia or malignancy.  Overall doing well.  Given pruritus and odor on exam today, I am suspicious she has some candidiasis.  Prescription for nystatin sent to her pharmacy.  Discussed findings of what looks like a small bone shard more superficially within her incision.  She notes she has had some of these worked their way out of the incision intermittently since  her treatment.  She continues to receive dialysis.  She is traveling to [facility] in [place] next month to see about their willingness to proceed with kidney transplant or to at least reconsider reconstruction for her open chronic wound, which has been the hold-up to receiving transplant at the other institutions where she has been seen.  Discussed getting updated imaging, either with pelvic MRI or PET scan.  She has been told that she will get imaging when she is at [facility] next month.  I have asked her to let me know when she gets back so we can have images shared with us .  I will plan to see her back in 6 months or sooner as needed.  Patient requested referral for mammogram today.  It looks like this was placed at her last visit with me in July.  She was counseled to call imaging center to schedule her mammogram.  22 minutes of total time was spent for this patient encounter, including preparation, face-to-face counseling with the patient and coordination of care, and documentation of the encounter.  Comer Dollar, MD  Division of Gynecologic Oncology  Department of Obstetrics and Gynecology  [facility] [place]

## 2024-02-08 NOTE — Patient Instructions (Signed)
 It was good to see you today.  Placed an order for you to get a mammogram back in July.  I think you just need to call to get this scheduled.  I will see you for follow-up in 6 months.  Please let me know after you get back from [place]  if they do not get the imaging we talked about.  If this is the case, I will order this for here.

## 2024-02-20 ENCOUNTER — Other Ambulatory Visit: Payer: Self-pay

## 2024-02-20 ENCOUNTER — Encounter (HOSPITAL_COMMUNITY): Payer: Self-pay | Admitting: Emergency Medicine

## 2024-02-20 ENCOUNTER — Observation Stay (HOSPITAL_COMMUNITY): Admission: EM | Admit: 2024-02-20 | Discharge: 2024-02-21 | Disposition: A | Discharge status: Home / Self Care

## 2024-02-20 DIAGNOSIS — N186 End stage renal disease: Secondary | ICD-10-CM | POA: Diagnosis not present

## 2024-02-20 DIAGNOSIS — Z79899 Other long term (current) drug therapy: Secondary | ICD-10-CM | POA: Diagnosis not present

## 2024-02-20 DIAGNOSIS — D62 Acute posthemorrhagic anemia: Secondary | ICD-10-CM | POA: Diagnosis not present

## 2024-02-20 DIAGNOSIS — D649 Anemia, unspecified: Secondary | ICD-10-CM | POA: Diagnosis not present

## 2024-02-20 DIAGNOSIS — D631 Anemia in chronic kidney disease: Secondary | ICD-10-CM | POA: Insufficient documentation

## 2024-02-20 DIAGNOSIS — I120 Hypertensive chronic kidney disease with stage 5 chronic kidney disease or end stage renal disease: Secondary | ICD-10-CM | POA: Insufficient documentation

## 2024-02-20 DIAGNOSIS — Z940 Kidney transplant status: Secondary | ICD-10-CM

## 2024-02-20 DIAGNOSIS — I10 Essential (primary) hypertension: Secondary | ICD-10-CM

## 2024-02-20 DIAGNOSIS — D638 Anemia in other chronic diseases classified elsewhere: Secondary | ICD-10-CM

## 2024-02-20 DIAGNOSIS — K219 Gastro-esophageal reflux disease without esophagitis: Secondary | ICD-10-CM | POA: Diagnosis not present

## 2024-02-20 DIAGNOSIS — R799 Abnormal finding of blood chemistry, unspecified: Secondary | ICD-10-CM | POA: Diagnosis present

## 2024-02-20 DIAGNOSIS — Z992 Dependence on renal dialysis: Secondary | ICD-10-CM | POA: Insufficient documentation

## 2024-02-20 DIAGNOSIS — Y828 Other medical devices associated with adverse incidents: Secondary | ICD-10-CM | POA: Insufficient documentation

## 2024-02-20 DIAGNOSIS — T8612 Kidney transplant failure: Secondary | ICD-10-CM | POA: Diagnosis not present

## 2024-02-20 LAB — CBC WITH DIFFERENTIAL/PLATELET
Abs Immature Granulocytes: 0.01 K/uL (ref 0.00–0.07)
Basophils Absolute: 0 K/uL (ref 0.0–0.1)
Basophils Relative: 0 %
Eosinophils Absolute: 0.1 K/uL (ref 0.0–0.5)
Eosinophils Relative: 3 %
HCT: 18.3 % — ABNORMAL LOW (ref 36.0–46.0)
Hemoglobin: 5.5 g/dL — CL (ref 12.0–15.0)
Immature Granulocytes: 0 %
Lymphocytes Relative: 17 %
Lymphs Abs: 0.7 K/uL (ref 0.7–4.0)
MCH: 27.7 pg (ref 26.0–34.0)
MCHC: 29.5 g/dL — ABNORMAL LOW (ref 30.0–36.0)
MCV: 93.8 fL (ref 80.0–100.0)
Monocytes Absolute: 0.2 K/uL (ref 0.1–1.0)
Monocytes Relative: 5 %
Neutro Abs: 3 K/uL (ref 1.7–7.7)
Neutrophils Relative %: 75 %
Platelets: 138 K/uL — ABNORMAL LOW (ref 150–400)
RBC: 1.95 MIL/uL — ABNORMAL LOW (ref 3.87–5.11)
RDW: 17.3 % — ABNORMAL HIGH (ref 11.5–15.5)
WBC: 4 K/uL (ref 4.0–10.5)
nRBC: 0 % (ref 0.0–0.2)

## 2024-02-20 LAB — BASIC METABOLIC PANEL WITH GFR
Anion gap: 20 — ABNORMAL HIGH (ref 5–15)
BUN: 29 mg/dL — ABNORMAL HIGH (ref 6–20)
CO2: 23 mmol/L (ref 22–32)
Calcium: 9.4 mg/dL (ref 8.9–10.3)
Chloride: 91 mmol/L — ABNORMAL LOW (ref 98–111)
Creatinine, Ser: 6.98 mg/dL — ABNORMAL HIGH (ref 0.44–1.00)
GFR, Estimated: 6 mL/min — ABNORMAL LOW (ref >60)
Glucose, Bld: 142 mg/dL — ABNORMAL HIGH (ref 70–99)
Potassium: 4 mmol/L (ref 3.5–5.1)
Sodium: 134 mmol/L — ABNORMAL LOW (ref 135–145)

## 2024-02-20 LAB — PREPARE RBC (CROSSMATCH)

## 2024-02-20 MED ORDER — ONDANSETRON HCL 4 MG PO TABS: 4.0000 mg | ORAL_TABLET | Freq: Four times a day (QID) | ORAL | Status: DC | PRN

## 2024-02-20 MED ORDER — CALCITRIOL 0.5 MCG PO CAPS: 0.5000 ug | ORAL_CAPSULE | ORAL | Status: DC

## 2024-02-20 MED ORDER — ACETAMINOPHEN 325 MG PO TABS: 650.0000 mg | ORAL_TABLET | Freq: Four times a day (QID) | ORAL | Status: DC | PRN

## 2024-02-20 MED ORDER — ACETAMINOPHEN 650 MG RE SUPP: 650.0000 mg | Freq: Four times a day (QID) | RECTAL | Status: DC | PRN

## 2024-02-20 MED ORDER — ALBUTEROL SULFATE (2.5 MG/3ML) 0.083% IN NEBU: 2.5000 mg | INHALATION_SOLUTION | Freq: Four times a day (QID) | RESPIRATORY_TRACT | Status: DC | PRN

## 2024-02-20 MED ORDER — TACROLIMUS 1 MG PO CAPS
1.0000 mg | ORAL_CAPSULE | Freq: Two times a day (BID) | ORAL | Status: DC
  Administered 2024-02-20 – 2024-02-21 (×2): 1 mg via ORAL
  Filled 2024-02-20 (×3): qty 1

## 2024-02-20 MED ORDER — BISACODYL 5 MG PO TBEC: 5.0000 mg | DELAYED_RELEASE_TABLET | Freq: Every day | ORAL | Status: DC | PRN

## 2024-02-20 MED ORDER — SODIUM CHLORIDE 0.9% IV SOLUTION: Freq: Once | INTRAVENOUS | Status: DC

## 2024-02-20 MED ORDER — SEVELAMER CARBONATE 800 MG PO TABS
800.0000 mg | ORAL_TABLET | Freq: Three times a day (TID) | ORAL | Status: DC
  Administered 2024-02-21: 800 mg via ORAL
  Filled 2024-02-20: qty 1

## 2024-02-20 MED ORDER — SENNOSIDES-DOCUSATE SODIUM 8.6-50 MG PO TABS: 1.0000 | ORAL_TABLET | Freq: Every evening | ORAL | Status: DC | PRN

## 2024-02-20 MED ORDER — ONDANSETRON HCL 4 MG/2ML IJ SOLN: 4.0000 mg | Freq: Four times a day (QID) | INTRAMUSCULAR | Status: DC | PRN

## 2024-02-20 MED ORDER — PANTOPRAZOLE SODIUM 40 MG PO TBEC
40.0000 mg | DELAYED_RELEASE_TABLET | Freq: Two times a day (BID) | ORAL | Status: DC
  Administered 2024-02-21: 40 mg via ORAL
  Filled 2024-02-20: qty 1

## 2024-02-20 MED ORDER — PREDNISONE 5 MG PO TABS
5.0000 mg | ORAL_TABLET | Freq: Every day | ORAL | Status: DC
  Administered 2024-02-21: 5 mg via ORAL
  Filled 2024-02-20: qty 1

## 2024-02-20 MED ORDER — LABETALOL HCL 5 MG/ML IV SOLN: 10.0000 mg | Freq: Four times a day (QID) | INTRAVENOUS | Status: DC | PRN

## 2024-02-20 NOTE — ED Triage Notes (Signed)
 Pt presents w/ Hgb 5.0 and SOB.  Pt reports labs were drawn 11/11.

## 2024-02-20 NOTE — ED Provider Notes (Signed)
 [place] EMERGENCY DEPARTMENT AT [facility] Provider Note   CSN: [id number] Arrival date & time: 02/20/24  1229     Patient presents with: Abnormal Lab   Christina Mack is a 58 y.o. female.   58 year old female presents for evaluation of abnormal blood work.  She has a history of anemia, kidney disease as well.  Has required blood transfusions before and was told today that she needed to come in for blood transfusion because hemoglobin is 5.5.  She denies any bleeding and is fairly asymptomatic.  States she gets a little bit lightheaded at times.   Abnormal Lab      Prior to Admission medications   Medication Sig Start Date End Date Taking? Authorizing Provider  acetaminophen  (TYLENOL ) 500 MG tablet Take 1,000 mg by mouth every 6 (six) hours as needed for moderate pain (pain score 4-6), fever or headache.   Yes [provider]  albuterol  (VENTOLIN  HFA) 108 (90 Base) MCG/ACT inhaler Inhale 2 puffs into the lungs every 6 (six) hours as needed for wheezing or shortness of breath.   Yes [provider]  calcitRIOL  (ROCALTROL ) 0.5 MCG capsule Take 0.5 mcg by mouth See admin instructions. Take one tablet by mouth every evening except on Monday Wednesday and Fridays take on tablet by mouth  in the mornings only per patient 10/23/22  Yes [provider]  pantoprazole  (PROTONIX ) 40 MG tablet TAKE 1 TABLET (40 MG TOTAL) BY MOUTH 2 (TWO) TIMES DAILY BEFORE A MEAL. 05/17/20 02/20/24 Yes Love, Sharlet GORMAN, PA-C  predniSONE  (DELTASONE ) 5 MG tablet Take 5 mg by mouth daily with breakfast. 08/26/19  Yes [provider]  sevelamer carbonate (RENVELA) 800 MG tablet Take 800 mg by mouth 3 (three) times daily with meals.   Yes [provider]  tacrolimus  (PROGRAF ) 1 MG capsule Take 1 mg by mouth 2 (two) times daily. 04/12/22  Yes [provider]  multivitamin (RENA-VIT) TABS tablet Take 1 tablet by mouth at bedtime. Patient not taking: Reported  on 09/30/2023 09/30/22   Barbarann Nest, MD    Allergies: Zosyn [piperacillin sod-tazobactam so], Dilaudid [hydromorphone], Apresoline  [hydralazine ], Codeine, Gabapentin , Norvasc  [amlodipine ], Zyvox  [linezolid ], and Velphoro [sucroferric oxyhydroxide]    Review of Systems  Constitutional:  Positive for fatigue. Negative for chills and fever.  HENT:  Negative for ear pain and sore throat.   Eyes:  Negative for pain and visual disturbance.  Respiratory:  Negative for cough and shortness of breath.   Cardiovascular:  Negative for chest pain and palpitations.  Gastrointestinal:  Negative for abdominal pain and vomiting.  Genitourinary:  Negative for dysuria and hematuria.  Musculoskeletal:  Negative for arthralgias and back pain.  Skin:  Negative for color change and rash.  Neurological:  Negative for seizures and syncope.  All other systems reviewed and are negative.   Updated Vital Signs BP (!) 174/101   Pulse 92   Temp 98.6 F (37 C)   Resp (!) 41   Ht 5' 5 (1.651 m)   Wt 68 kg   SpO2 100%   BMI 24.95 kg/m   Physical Exam Vitals and nursing note reviewed.  Constitutional:      General: She is not in acute distress.    Appearance: Normal appearance. She is well-developed. She is not ill-appearing.  HENT:     Head: Normocephalic and atraumatic.  Eyes:     Conjunctiva/sclera: Conjunctivae normal.  Cardiovascular:     Rate and Rhythm: Normal rate and regular rhythm.  Heart sounds: No murmur heard. Pulmonary:     Effort: Pulmonary effort is normal. No respiratory distress.     Breath sounds: Normal breath sounds.  Abdominal:     Palpations: Abdomen is soft.     Tenderness: There is no abdominal tenderness.  Musculoskeletal:        General: No swelling.     Cervical back: Neck supple.  Skin:    General: Skin is warm and dry.     Capillary Refill: Capillary refill takes less than 2 seconds.  Neurological:     General: No focal deficit present.     Mental Status:  She is alert.  Psychiatric:        Mood and Affect: Mood normal.     (all labs ordered are listed, but only abnormal results are displayed) Labs Reviewed  BASIC METABOLIC PANEL WITH GFR - Abnormal; Notable for the following components:      Result Value   Sodium 134 (*)    Chloride 91 (*)    Glucose, Bld 142 (*)    BUN 29 (*)    Creatinine, Ser 6.98 (*)    GFR, Estimated 6 (*)    Anion gap 20 (*)    All other components within normal limits  CBC WITH DIFFERENTIAL/PLATELET - Abnormal; Notable for the following components:   RBC 1.95 (*)    Hemoglobin 5.5 (*)    HCT 18.3 (*)    MCHC 29.5 (*)    RDW 17.3 (*)    Platelets 138 (*)    All other components within normal limits  HIV ANTIBODY (ROUTINE TESTING W REFLEX)  CBC  IRON AND TIBC  FERRITIN  VITAMIN B12  RENAL FUNCTION PANEL  TYPE AND SCREEN  PREPARE RBC (CROSSMATCH)    EKG: None  Radiology: No results found.   .Critical Care  Performed by: Gennaro Duwaine CROME, DO Authorized by: Gennaro Duwaine CROME, DO   Critical care provider statement:    Critical care time (minutes):  30   Critical care time was exclusive of:  Separately billable procedures and treating other patients and teaching time   Critical care was necessary to treat or prevent imminent or life-threatening deterioration of the following conditions:  Circulatory failure   Critical care was time spent personally by me on the following activities:  Development of treatment plan with patient or surrogate, discussions with consultants, evaluation of patient's response to treatment, examination of patient, ordering and review of laboratory studies, ordering and review of radiographic studies, ordering and performing treatments and interventions, pulse oximetry, re-evaluation of patient's condition and review of old charts   Care discussed with: admitting provider      Medications Ordered in the ED  0.9 %  sodium chloride  infusion (Manually program via Guardrails  IV Fluids) (has no administration in time range)  predniSONE  (DELTASONE ) tablet 5 mg (has no administration in time range)  pantoprazole  (PROTONIX ) EC tablet 40 mg (has no administration in time range)  sevelamer carbonate (RENVELA) tablet 800 mg (has no administration in time range)  tacrolimus  (PROGRAF ) capsule 1 mg (has no administration in time range)  albuterol  (PROVENTIL ) (2.5 MG/3ML) 0.083% nebulizer solution 2.5 mg (has no administration in time range)  acetaminophen  (TYLENOL ) tablet 650 mg (has no administration in time range)    Or  acetaminophen  (TYLENOL ) suppository 650 mg (has no administration in time range)  senna-docusate (Senokot-S) tablet 1 tablet (has no administration in time range)  bisacodyl  (DULCOLAX) EC tablet 5 mg (has no  administration in time range)  ondansetron  (ZOFRAN ) tablet 4 mg (has no administration in time range)    Or  ondansetron  (ZOFRAN ) injection 4 mg (has no administration in time range)  calcitRIOL  (ROCALTROL ) capsule 0.5 mcg (has no administration in time range)    And  calcitRIOL  (ROCALTROL ) capsule 0.5 mcg (has no administration in time range)  labetalol  (NORMODYNE ) injection 10 mg (has no administration in time range)                                    Medical Decision Making Cardiac monitor interpretation: Sinus rhythm, no ectopy  Patient here for anemia of chronic disease but hemoglobin is 5 today in the office that she was sent in.  It is 5.5 here.  She has some what short of breath at times she says as well as somewhat fatigued.  Will transfuse her 2 units.  Discussed patient's case with Dr. Lou, hospitalist the patient to be admitted for further workup and management.  Remainder of patient's lab workup is fairly baseline for her.  She is agreeable with the plan for admission  Problems Addressed: Anemia of chronic disease: chronic illness or injury Symptomatic anemia: acute illness or injury  Amount and/or Complexity of Data  Reviewed External Data Reviewed: notes.    Details: Prior hospital records reviewed and patient has a history of kidney transplant with complications Labs: ordered. Decision-making details documented in ED Course.    Details: Ordered and reviewed by me and fairly baseline except patient has some anemia with a hemoglobin of 5.5 Discussion of management or test interpretation with external provider(s): Dr. Lou -hospitalist-I spoke with him regarding patient's case he will admit the patient for further workup and management  Risk OTC drugs. Prescription drug management. Drug therapy requiring intensive monitoring for toxicity. Decision regarding hospitalization. Risk Details: CRITICAL CARE Performed by: Duwaine LITTIE Fusi   Total critical care time: 30 minutes  Critical care time was exclusive of separately billable procedures and treating other patients.  Critical care was necessary to treat or prevent imminent or life-threatening deterioration.  Critical care was time spent personally by me on the following activities: development of treatment plan with patient and/or surrogate as well as nursing, discussions with consultants, evaluation of patient's response to treatment, examination of patient, obtaining history from patient or surrogate, ordering and performing treatments and interventions, ordering and review of laboratory studies, ordering and review of radiographic studies, pulse oximetry and re-evaluation of patient's condition.    Critical Care Total time providing critical care: 30 minutes     Final diagnoses:  Symptomatic anemia  Anemia of chronic disease    ED Discharge Orders     None          Fusi Duwaine LITTIE, DO 02/20/24 2301

## 2024-02-20 NOTE — ED Provider Triage Note (Signed)
 Emergency Medicine Provider Triage Evaluation Note  LURENE ROBLEY , a 58 y.o. female  was evaluated in triage.  Pt complains of low hemoglobin on 11/11.  Patient ESRD on dialysis at home Monday Tuesday Thursday Friday  Review of Systems  Positive: Fatigue, dyspnea on exertion Negative: Nausea, vomiting, fever, chills, chest pain, shortness of breath  Physical Exam  BP (!) 134/90   Pulse 99   Resp 18   SpO2 99%  Gen:   Awake, no distress   Resp:  Normal effort  MSK:   Moves extremities without difficulty  Other:  Pale-appearing  Medical Decision Making  Medically screening exam initiated at 1:03 PM.  Appropriate orders placed.  GENESE QUEBEDEAUX was informed that the remainder of the evaluation will be completed by another provider, this initial triage assessment does not replace that evaluation, and the importance of remaining in the ED until their evaluation is complete.  Labs ordered   Francis Ileana SAILOR, PA-C 02/20/24 1304

## 2024-02-20 NOTE — ED Triage Notes (Signed)
 Pt had dialysis yesterday. Sent for hgb 5. States she only wants to be seen for a blood transfusion.

## 2024-02-20 NOTE — ED Notes (Signed)
 Hospitalist at bedside

## 2024-02-20 NOTE — H&P (Signed)
 History and Physical  Christina Mack FMW:[id number] DOB: 24-Feb-1966 DOA: 02/20/2024  PCP: [facility]   Chief Complaint: Abnormal labs  HPI: Christina Mack is a 58 y.o. 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 Patient reports she does home HD and checks her hemoglobin once a month. Over the last few months, her hemoglobin has been below 6.  She was told she needed blood transfusion but has been feeling well overall. She last checked it on 11/11 and was down to 5.0.  She was seen by her PCP today and was advised to present to the ED for blood transfusion.  Patient reports feeling well overall but endorses some fatigue and dyspnea on exertion over the last few weeks. She denies any fevers, nausea, vomiting, abdominal pain, chest pain, dizziness, headache, vision changes or bloody stools.  ED Course: Initial vitals show patient afebrile, hypertensive with SBP in the 130s to 150s. Initial labs significant for sodium 134, K+ 4.0, glucose 142, BUNs/creatinine 29/6.98, WBC 4.0, Hgb 5.5, platelet 138.  2 units PRBC was ordered by EDP. [facility] was consulted for admission.   Review of Systems: Please see HPI for pertinent positives and negatives. A complete 10 system review of systems are otherwise negative.  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[facility])    Past Surgical History:  Procedure Laterality Date   APPLICATION OF WOUND VAC Right 01/22/2022   Procedure: APPLICATION OF WOUND  VAC;  Surgeon: Jerri Kay HERO, MD;  Location: [facility] OR;  Service: Orthopedics;  Laterality: Right;   BIOPSY  04/24/2020   Procedure: BIOPSY;  Surgeon: Legrand Victory LITTIE DOUGLAS, MD;  Location: [facility] ENDOSCOPY;  Service: Gastroenterology;;   BIOPSY  05/27/2022   Procedure: BIOPSY;  Surgeon: Eda Iha, MD;  Location: [facility] ENDOSCOPY;  Service: Gastroenterology;;   CATARACT EXTRACTION  04/2023   Both   COLONOSCOPY WITH PROPOFOL  N/A 05/27/2022   Procedure: COLONOSCOPY WITH PROPOFOL ;  Surgeon: Eda Iha, MD;  Location: [facility] ENDOSCOPY;  Service: Gastroenterology;  Laterality: N/A;   ESOPHAGOGASTRODUODENOSCOPY (EGD) WITH PROPOFOL  N/A 04/24/2020   Procedure: ESOPHAGOGASTRODUODENOSCOPY (EGD) WITH PROPOFOL ;  Surgeon: Legrand Victory LITTIE DOUGLAS, MD;  Location: [facility] ENDOSCOPY;  Service: Gastroenterology;  Laterality: N/A;   ESOPHAGOGASTRODUODENOSCOPY (EGD) WITH PROPOFOL  N/A 05/27/2022   Procedure: ESOPHAGOGASTRODUODENOSCOPY (EGD) WITH PROPOFOL ;  Surgeon: Eda Iha, MD;  Location: [facility] ENDOSCOPY;  Service: Gastroenterology;  Laterality: N/A;   GIVENS CAPSULE STUDY N/A 05/28/2022   Procedure: GIVENS CAPSULE STUDY;  Surgeon: Eda Iha, MD;  Location: [facility] ENDOSCOPY;  Service: Gastroenterology;  Laterality: N/A;   IR FLUORO GUIDE CV LINE RIGHT  03/24/2022   IR FLUORO GUIDE CV LINE RIGHT  07/24/2022   IR FLUORO GUIDE CV LINE RIGHT  11/22/2022   IR PERC TUN PERIT CATH WO PORT S&I /IMAG  03/24/2022   IR PTA VENOUS EXCEPT DIALYSIS CIRCUIT  11/22/2022   IR REMOVAL TUN CV CATH W/O FL  05/08/2022   IR US  GUIDE BX ASP/DRAIN  04/30/2020   IR US  GUIDE VASC ACCESS RIGHT  03/24/2022   IR US  GUIDE VASC ACCESS RIGHT  07/24/2022   KIDNEY TRANSPLANT  2001   KIDNEY TRANSPLANT  2014   LEEP     ORIF TIBIA PLATEAU Right 01/22/2022   Procedure: OPEN REDUCTION INTERNAL FIXATION (ORIF) TIBIAL PLATEAU;  Surgeon: Addie Cordella Hamilton, MD;  Location: [facility] OR;  Service: Orthopedics;  Laterality: Right;   TOTAL ABDOMINAL HYSTERECTOMY      ovaries left in situ   TOTAL HIP ARTHROPLASTY Right 01/22/2022   Procedure: TOTAL HIP ARTHROPLASTY ANTERIOR APPROACH;  Surgeon: Jerri Kay HERO, MD;  Location: [facility] OR;  Service: Orthopedics;  Laterality: Right;   TOTAL HIP ARTHROPLASTY Left 09/28/2022   Procedure: TOTAL HIP POSTERIOR;  Surgeon: Josefina Chew, MD;  Location: [facility] OR;  Service: Orthopedics;  Laterality: Left;   Social History:  reports that she has never smoked. She has never used smokeless tobacco. She reports that she does not currently use alcohol. She reports that she does not use drugs.  Allergies  Allergen Reactions   Zosyn [Piperacillin Sod-Tazobactam So] Other (See Comments)    Thrombocytopenia   Dilaudid [Hydromorphone] Nausea And Vomiting and Rash   Apresoline  [Hydralazine ] Other (See Comments)    Headache   Codeine Other (See Comments)    vomiting   Gabapentin      Hypersensitivity and tingling   Norvasc  [Amlodipine ] Swelling    Ankle/lower extremity swelling   Zyvox  [Linezolid ] Other (See Comments)    Decreased platelet counts   Velphoro [Sucroferric Oxyhydroxide] Other (See Comments)    Unknown reaction    Family History  Problem Relation Age of Onset   Cerebral aneurysm Mother    AAA (abdominal aortic aneurysm) Brother    Breast cancer Paternal Aunt    Heart attack Maternal Grandmother    Brain cancer Maternal Grandfather    Colon cancer Neg Hx    Ovarian cancer Neg Hx    Endometrial cancer Neg Hx    Pancreatic cancer Neg Hx    Prostate cancer Neg Hx      Prior to Admission medications   Medication Sig Start Date End Date Taking? Authorizing Provider  acetaminophen  (TYLENOL ) 500 MG tablet Take 1,000 mg by mouth every 6 (six) hours as needed for moderate pain (pain score 4-6), fever or headache.   Yes [provider]  albuterol  (VENTOLIN  HFA) 108 (90 Base) MCG/ACT inhaler Inhale 2 puffs into the lungs every 6 (six) hours as needed for wheezing or shortness of breath.   Yes [provider]  calcitRIOL  (ROCALTROL ) 0.5 MCG capsule Take 0.5 mcg by mouth See admin instructions. Take one tablet by mouth every evening except on Monday Wednesday and Fridays take on tablet by mouth  in the mornings only per patient 10/23/22  Yes [provider]  pantoprazole  (PROTONIX ) 40 MG tablet TAKE 1 TABLET (40 MG TOTAL) BY MOUTH 2 (TWO) TIMES DAILY BEFORE A MEAL. 05/17/20 02/20/24 Yes Love, Sharlet RAMAN, PA-C  predniSONE  (DELTASONE ) 5 MG tablet Take 5 mg by mouth daily with breakfast. 08/26/19  Yes [provider]  sevelamer carbonate (RENVELA) 800 MG tablet Take 800 mg by mouth 3 (three) times daily with meals.   Yes [provider]  tacrolimus  (PROGRAF ) 1 MG capsule Take 1 mg by mouth 2 (two) times daily. 04/12/22  Yes [provider]  multivitamin (RENA-VIT) TABS tablet Take 1 tablet by mouth at bedtime. Patient not taking: Reported on 09/30/2023 09/30/22   Barbarann Nest,  MD    Physical Exam: BP (!) 151/97   Pulse 91   Temp 98 F (36.7 C) (Oral)   Resp 17   Ht 5' 5 (1.651 m)   Wt 68 kg   SpO2 99%   BMI 24.95 kg/m  General: Pleasant, well-appearing middle-age woman laying in bed. No acute distress. HEENT: [place]/AT. Anicteric sclera Chest wall: Right tunneled HD catheter stable in the right chest wall with no surrounding erythema.  CV: RRR. No murmurs, rubs, or gallops. No LE edema Pulmonary: Lungs CTAB. Normal effort. No wheezing or rales. Abdominal: Soft, nontender, nondistended. Normal bowel sounds. Extremities: RLE lymphedema. Palpable pedal pulses. Skin: Warm and dry. No obvious rash or lesions. Neuro: A&Ox3. Moves all extremities. Normal sensation to light touch. No focal deficit. Psych: Normal mood and affect          Labs on Admission:  Basic Metabolic Panel: Recent Labs  Lab 02/20/24 1313  NA 134*  K 4.0  CL 91*  CO2 23  GLUCOSE 142*  BUN 29*  CREATININE 6.98*  CALCIUM  9.4   Liver Function Tests: No results for input(s):  AST, ALT, ALKPHOS, BILITOT, PROT, ALBUMIN in the last 168 hours. No results for input(s): LIPASE, AMYLASE in the last 168 hours. No results for input(s): AMMONIA in the last 168 hours. CBC: Recent Labs  Lab 02/20/24 1313  WBC 4.0  NEUTROABS 3.0  HGB 5.5*  HCT 18.3*  MCV 93.8  PLT 138*   Cardiac Enzymes: No results for input(s): CKTOTAL, CKMB, CKMBINDEX, TROPONINI in the last 168 hours. BNP (last 3 results) Recent Labs    06/05/23 1825  BNP 33.4    ProBNP (last 3 results) No results for input(s): PROBNP in the last 8760 hours.  CBG: No results for input(s): GLUCAP in the last 168 hours.  Radiological Exams on Admission: No results found. Assessment/Plan Christina Mack is a 58 y.o. 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, cancer related pain, IDA, anemia of chronic disease, chronic osteomyelitis and GERD who presented to the ED for evaluation of abnormal labs and admitted for acute on chronic anemia  # Acute on chronic anemia # Symptomatic anemia - Patient presented with weeks of fatigue and dyspnea on exertion - Hgb on admission with acute dropto 5.5 from baseline of 7-8 - No evidence of active bleed, anemia due to anemia of chronic kidney disease - Patient hemodynamically stable - Continue 2 units PRBC transfusion - Follow up iron studies, ferritin and vitamin B12 - Trend CBC and Transfuse for Hgb goal > 7  # ESRD on home HD - ESRD due to failed renal transplant, previously receiving HD at the center but over the last few years doing home HD - Reports she dialyzed on Mondays, Tuesdays, Thursdays and Fridays for 4 hours from 5 AM to 9 AM - No electrolyte abnormalities, signs of uremia or hypervolemia to indicate urgent HD - Continue Calcitrol and Renvela  - Trend renal function-avoid nephrotoxic  agents - Continue home HD at discharge  # HTN - BP elevated with SBP in the 130s to 150s - Patient not on any antihypertensive meds at home - IV labetalol  PRN for SBP > 180  # Failed renal transplant - Continue prednisone  and Prograf   # GERD - Continue Protonix    DVT prophylaxis: SCDs    Code Status: Full Code  Consults called: None  Family Communication: Discussed admission with spouse at bedside  Severity of Illness: The appropriate patient status for this patient is OBSERVATION. Observation status is judged to be reasonable and necessary in order to provide the required intensity of service to ensure the patient's safety. The patient's presenting symptoms, physical exam findings, and initial radiographic and laboratory data in the context of their medical condition is felt to place them at decreased risk for further clinical deterioration. Furthermore, it is anticipated that the patient will be medically stable for discharge from the hospital within 2 midnights of admission.   Level of care: Telemetry    Lou Claretta HERO, MD 02/20/2024, 8:57 PM [facility] Pager: [phone] Isaiah 41:10   If 7PM-7AM, please contact night-coverage [email or web address] Password [email or web address]

## 2024-02-20 NOTE — ED Notes (Signed)
 CCMD called and pt placed on continuous cardiac monitoring

## 2024-02-20 NOTE — ED Notes (Signed)
 CCMD notified of need for cardiac monitoring.

## 2024-02-21 DIAGNOSIS — D649 Anemia, unspecified: Secondary | ICD-10-CM | POA: Diagnosis not present

## 2024-02-21 LAB — TYPE AND SCREEN
ABO/RH(D): O POS
Antibody Screen: NEGATIVE
Unit division: 0
Unit division: 0

## 2024-02-21 LAB — CBC
HCT: 44.3 % (ref 36.0–46.0)
Hemoglobin: 14.1 g/dL (ref 12.0–15.0)
MCH: 28.1 pg (ref 26.0–34.0)
MCHC: 31.8 g/dL (ref 30.0–36.0)
MCV: 88.2 fL (ref 80.0–100.0)
Platelets: 87 K/uL — ABNORMAL LOW (ref 150–400)
RBC: 5.02 MIL/uL (ref 3.87–5.11)
RDW: 17.8 % — ABNORMAL HIGH (ref 11.5–15.5)
WBC: 2.2 K/uL — ABNORMAL LOW (ref 4.0–10.5)
nRBC: 0 % (ref 0.0–0.2)

## 2024-02-21 LAB — BPAM RBC
Blood Product Expiration Date: 202512192359
Blood Product Expiration Date: 202512192359
ISSUE DATE / TIME: 202511191836
ISSUE DATE / TIME: 202511192157
Unit Type and Rh: 5100
Unit Type and Rh: 5100

## 2024-02-21 LAB — RENAL FUNCTION PANEL
Albumin: 2.6 g/dL — ABNORMAL LOW (ref 3.5–5.0)
Anion gap: 16 — ABNORMAL HIGH (ref 5–15)
BUN: 38 mg/dL — ABNORMAL HIGH (ref 6–20)
CO2: 23 mmol/L (ref 22–32)
Calcium: 9.4 mg/dL (ref 8.9–10.3)
Chloride: 97 mmol/L — ABNORMAL LOW (ref 98–111)
Creatinine, Ser: 7.92 mg/dL — ABNORMAL HIGH (ref 0.44–1.00)
GFR, Estimated: 5 mL/min — ABNORMAL LOW (ref >60)
Glucose, Bld: 90 mg/dL (ref 70–99)
Phosphorus: 5.4 mg/dL — ABNORMAL HIGH (ref 2.5–4.6)
Potassium: 4.7 mmol/L (ref 3.5–5.1)
Sodium: 136 mmol/L (ref 135–145)

## 2024-02-21 LAB — IRON AND TIBC
Iron: 61 ug/dL (ref 28–170)
Saturation Ratios: 30 % (ref 10.4–31.8)
TIBC: 203 ug/dL — ABNORMAL LOW (ref 250–450)
UIBC: 142 ug/dL

## 2024-02-21 LAB — FERRITIN: Ferritin: 1126 ng/mL — ABNORMAL HIGH (ref 11–307)

## 2024-02-21 LAB — VITAMIN B12: Vitamin B-12: 748 pg/mL (ref 180–914)

## 2024-02-21 LAB — HEMOGLOBIN AND HEMATOCRIT, BLOOD
HCT: 24.5 % — ABNORMAL LOW (ref 36.0–46.0)
Hemoglobin: 7.6 g/dL — ABNORMAL LOW (ref 12.0–15.0)

## 2024-02-21 LAB — HIV ANTIBODY (ROUTINE TESTING W REFLEX): HIV Screen 4th Generation wRfx: NONREACTIVE

## 2024-02-21 NOTE — Discharge Summary (Signed)
 Physician Discharge Summary  [name]:[id number] DOB: 1966/01/13 DOA: 02/20/2024  PCP: [facility]  Admit date: 02/20/2024 Discharge date: 02/21/2024  Admitted From: Home Disposition: Home  Recommendations for Outpatient Follow-up:  Follow up with PCP in 1 week with repeat CBC/BMP Follow up in ED if symptoms worsen or new appear   Discharge Condition: Stable CODE STATUS: Full Diet recommendation: Heart healthy  Brief/Interim Summary:   Christina Mack is a 58 y.o. 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  Her baseline hemoglobin ranges between 7-8 but recently has been trending down.  She is largely asymptomatic with it.  She was seen by primary care provider and hemoglobin checked on 11/11 was 5.0.  She was recommended for blood transfusion and therefore sent to the ER.  Her vital signs are stable in the ER.  Hemoglobin 5.5.  She received 2 unit PRBC transfusion with improvement in hemoglobin to 7.6.  She was discharged posttransfusion.  She will continue to follow-up with PCP.  She will continue home hemodialysis 4 times a week.  Discharged in stable condition.  Discharge Diagnoses:  Principal Problem:   Acute on chronic anemia Active Problems:   ESRD on hemodialysis (HCC)   History of renal transplant   Symptomatic anemia    Discharge Instructions  Discharge Instructions     Diet - low sodium heart healthy   Complete by: As directed    Discharge instructions   Complete by: As directed    1. Continue hemodialysis on your regular schedule 2. F/u with PCP/nephrology within a week   Increase activity slowly   Complete by: As directed       Allergies as of  02/21/2024       Reactions   Zosyn [piperacillin Sod-tazobactam So] Other (See Comments)   Thrombocytopenia   Dilaudid [hydromorphone] Nausea And Vomiting, Rash   Apresoline  [hydralazine ] Other (See Comments)   Headache   Codeine Other (See Comments)   vomiting   Gabapentin     Hypersensitivity and tingling   Norvasc  [amlodipine ] Swelling   Ankle/lower extremity swelling   Zyvox  [linezolid ] Other (See Comments)   Decreased platelet counts   Velphoro [sucroferric Oxyhydroxide] Other (See Comments)   Unknown reaction        Medication List     TAKE these medications    acetaminophen  500 MG tablet Commonly known as: TYLENOL  Take 1,000 mg by mouth every 6 (six) hours as needed for moderate pain (pain score 4-6), fever or headache.   albuterol  108 (90 Base) MCG/ACT inhaler Commonly known as: VENTOLIN  HFA Inhale 2 puffs into the lungs every 6 (six) hours as needed for wheezing or shortness of breath.   calcitRIOL  0.5 MCG capsule Commonly known as: ROCALTROL  Take 0.5 mcg by mouth See admin instructions. Take one tablet by mouth every evening except on Monday Wednesday and Fridays take on tablet by mouth  in the mornings only per patient   multivitamin Tabs tablet Take 1 tablet by mouth at bedtime.   pantoprazole  40 MG tablet Commonly known as: PROTONIX  TAKE 1 TABLET (40 MG TOTAL) BY MOUTH 2 (TWO) TIMES DAILY BEFORE A MEAL.   predniSONE  5 MG tablet Commonly known as: DELTASONE  Take 5 mg by mouth daily  with breakfast.   Prograf  1 MG capsule Generic drug: tacrolimus  Take 1 mg by mouth 2 (two) times daily.   sevelamer carbonate 800 MG tablet Commonly known as: RENVELA Take 800 mg by mouth 3 (three) times daily with meals.        Allergies  Allergen Reactions   Zosyn [Piperacillin Sod-Tazobactam So] Other (See Comments)    Thrombocytopenia   Dilaudid [Hydromorphone] Nausea And Vomiting and Rash   Apresoline  [Hydralazine ] Other (See Comments)    Headache    Codeine Other (See Comments)    vomiting   Gabapentin      Hypersensitivity and tingling   Norvasc  [Amlodipine ] Swelling    Ankle/lower extremity swelling   Zyvox  [Linezolid ] Other (See Comments)    Decreased platelet counts   Velphoro [Sucroferric Oxyhydroxide] Other (See Comments)    Unknown reaction    Consultations:    Procedures/Studies: No results found.    Subjective:   Discharge Exam: Vitals:   02/21/24 0745 02/21/24 1043  BP:  (!) 170/105  Pulse:  88  Resp:  19  Temp: 98.9 F (37.2 C) 98.6 F (37 C)  SpO2:  99%    General: Pleasant, well-appearing middle-age woman laying in bed. No acute distress. HEENT: [place]/AT. Anicteric sclera Chest wall: Right tunneled HD catheter stable in the right chest wall with no surrounding erythema.  CV: RRR. No murmurs, rubs, or gallops. No LE edema Pulmonary: Lungs CTAB. Normal effort. No wheezing or rales. Abdominal: Soft, nontender, nondistended. Normal bowel sounds. Extremities: RLE lymphedema. Palpable pedal pulses. Skin: Warm and dry. No obvious rash or lesions. Neuro: A&Ox3. Moves all extremities. Normal sensation to light touch. No focal deficit. Psych: Normal mood and affect    The results of significant diagnostics from this hospitalization (including imaging, microbiology, ancillary and laboratory) are listed below for reference.     Microbiology: No results found for this or any previous visit (from the past 240 hours).   Labs: BNP (last 3 results) Recent Labs    06/05/23 1825  BNP 33.4   Basic Metabolic Panel: Recent Labs  Lab 02/20/24 1313 02/21/24 0410  NA 134* 136  K 4.0 4.7  CL 91* 97*  CO2 23 23  GLUCOSE 142* 90  BUN 29* 38*  CREATININE 6.98* 7.92*  CALCIUM  9.4 9.4  PHOS  --  5.4*   Liver Function Tests: Recent Labs  Lab 02/21/24 0410  ALBUMIN 2.6*   No results for input(s): LIPASE, AMYLASE in the last 168 hours. No results for input(s): AMMONIA in the last 168  hours. CBC: Recent Labs  Lab 02/20/24 1313 02/21/24 0410 02/21/24 0906  WBC 4.0 2.2*  --   NEUTROABS 3.0  --   --   HGB 5.5* 14.1 7.6*  HCT 18.3* 44.3 24.5*  MCV 93.8 88.2  --   PLT 138* 87*  --    Cardiac Enzymes: No results for input(s): CKTOTAL, CKMB, CKMBINDEX, TROPONINI in the last 168 hours. BNP: Invalid input(s): POCBNP CBG: No results for input(s): GLUCAP in the last 168 hours. D-Dimer No results for input(s): DDIMER in the last 72 hours. Hgb A1c No results for input(s): HGBA1C in the last 72 hours. Lipid Profile No results for input(s): CHOL, HDL, LDLCALC, TRIG, CHOLHDL, LDLDIRECT in the last 72 hours. Thyroid function studies No results for input(s): TSH, T4TOTAL, T3FREE, THYROIDAB in the last 72 hours.  Invalid input(s): FREET3 Anemia work up Recent Labs    02/21/24 0410  VITAMINB12 748  FERRITIN 1,126*  TIBC 203*  IRON 61   Urinalysis    Component Value Date/Time   COLORURINE YELLOW 09/30/2023 1331   APPEARANCEUR CLEAR 09/30/2023 1331   LABSPEC 1.008 09/30/2023 1331   PHURINE 9.0 (H) 09/30/2023 1331   GLUCOSEU 50 (A) 09/30/2023 1331   HGBUR SMALL (A) 09/30/2023 1331   BILIRUBINUR NEGATIVE 09/30/2023 1331   BILIRUBINUR negative 11/02/2020 1037   KETONESUR NEGATIVE 09/30/2023 1331   PROTEINUR 100 (A) 09/30/2023 1331   UROBILINOGEN 0.2 11/02/2020 1037   NITRITE NEGATIVE 09/30/2023 1331   LEUKOCYTESUR MODERATE (A) 09/30/2023 1331   Sepsis Labs Recent Labs  Lab 02/20/24 1313 02/21/24 0410  WBC 4.0 2.2*   Microbiology No results found for this or any previous visit (from the past 240 hours).   Time coordinating discharge: 35 minutes  SIGNED:   Fayetta Sorenson, MD  [facility] 02/21/2024, 4:33 PM

## 2024-02-21 NOTE — Care Management (Signed)
  Transition of Care [facility]) Screening Note   Patient Details  Name: KETURA SIREK Date of Birth: 11-15-1965   Transition of Care [facility]) CM/SW Contact:    Corean JAYSON Canary, RN Phone Number: 02/21/2024, 9:22 AM    Transition of Care Department [facility]) has reviewed patient and no TOC needs have been identified at this time. We will continue to monitor patient advancement through interdisciplinary progression rounds. If new patient transition needs arise, please place a TOC consult.  Patient will be discharging today after repeat H&H

## 2024-03-19 ENCOUNTER — Encounter: Payer: Self-pay | Admitting: Gynecologic Oncology

## 2024-03-19 ENCOUNTER — Ambulatory Visit: Admitting: Nurse Practitioner

## 2024-03-20 ENCOUNTER — Ambulatory Visit

## 2024-04-23 ENCOUNTER — Ambulatory Visit
Admission: RE | Admit: 2024-04-23 | Discharge: 2024-04-23 | Disposition: A | Source: Ambulatory Visit | Attending: Gynecologic Oncology

## 2024-04-23 ENCOUNTER — Encounter (HOSPITAL_COMMUNITY): Payer: Self-pay

## 2024-04-23 DIAGNOSIS — Z1231 Encounter for screening mammogram for malignant neoplasm of breast: Secondary | ICD-10-CM

## 2024-05-05 ENCOUNTER — Ambulatory Visit: Admitting: Nurse Practitioner

## 2024-06-25 ENCOUNTER — Encounter: Admitting: Dermatology

## 2024-07-09 ENCOUNTER — Encounter: Admitting: Dermatology

## 2024-08-08 ENCOUNTER — Inpatient Hospital Stay: Admitting: Gynecologic Oncology
